# Patient Record
Sex: Female | Born: 1940
Health system: Southern US, Community
[De-identification: ages and names within clinical notes are randomized; demographics above are authoritative.]

## PROBLEM LIST (undated history)

## (undated) DIAGNOSIS — E119 Type 2 diabetes mellitus without complications: Secondary | ICD-10-CM

## (undated) DIAGNOSIS — K219 Gastro-esophageal reflux disease without esophagitis: Secondary | ICD-10-CM

## (undated) DIAGNOSIS — E785 Hyperlipidemia, unspecified: Secondary | ICD-10-CM

## (undated) DIAGNOSIS — F319 Bipolar disorder, unspecified: Secondary | ICD-10-CM

## (undated) DIAGNOSIS — I499 Cardiac arrhythmia, unspecified: Secondary | ICD-10-CM

## (undated) DIAGNOSIS — F209 Schizophrenia, unspecified: Secondary | ICD-10-CM

## (undated) DIAGNOSIS — I10 Essential (primary) hypertension: Secondary | ICD-10-CM

## (undated) HISTORY — DX: Hyperlipidemia, unspecified: E78.5

## (undated) HISTORY — DX: Type 2 diabetes mellitus without complications: E11.9

## (undated) HISTORY — DX: Essential (primary) hypertension: I10

---

## 2006-10-10 ENCOUNTER — Ambulatory Visit: Payer: Self-pay | Admitting: Ophthalmology

## 2006-10-18 ENCOUNTER — Ambulatory Visit: Payer: Self-pay | Admitting: Ophthalmology

## 2011-04-11 DIAGNOSIS — H4010X0 Unspecified open-angle glaucoma, stage unspecified: Secondary | ICD-10-CM | POA: Diagnosis not present

## 2011-04-15 DIAGNOSIS — F259 Schizoaffective disorder, unspecified: Secondary | ICD-10-CM | POA: Diagnosis not present

## 2011-05-02 DIAGNOSIS — F259 Schizoaffective disorder, unspecified: Secondary | ICD-10-CM | POA: Diagnosis not present

## 2011-05-03 DIAGNOSIS — Z130 Encounter for screening for diseases of the blood and blood-forming organs and certain disorders involving the immune mechanism: Secondary | ICD-10-CM | POA: Diagnosis not present

## 2011-05-03 DIAGNOSIS — I10 Essential (primary) hypertension: Secondary | ICD-10-CM | POA: Diagnosis not present

## 2011-05-03 DIAGNOSIS — Z1329 Encounter for screening for other suspected endocrine disorder: Secondary | ICD-10-CM | POA: Diagnosis not present

## 2011-05-03 DIAGNOSIS — E119 Type 2 diabetes mellitus without complications: Secondary | ICD-10-CM | POA: Diagnosis not present

## 2011-05-16 DIAGNOSIS — F259 Schizoaffective disorder, unspecified: Secondary | ICD-10-CM | POA: Diagnosis not present

## 2011-05-30 DIAGNOSIS — F259 Schizoaffective disorder, unspecified: Secondary | ICD-10-CM | POA: Diagnosis not present

## 2011-06-06 DIAGNOSIS — E119 Type 2 diabetes mellitus without complications: Secondary | ICD-10-CM | POA: Diagnosis not present

## 2011-06-06 DIAGNOSIS — I10 Essential (primary) hypertension: Secondary | ICD-10-CM | POA: Diagnosis not present

## 2011-06-06 DIAGNOSIS — Z13228 Encounter for screening for other metabolic disorders: Secondary | ICD-10-CM | POA: Diagnosis not present

## 2011-06-06 DIAGNOSIS — Z130 Encounter for screening for diseases of the blood and blood-forming organs and certain disorders involving the immune mechanism: Secondary | ICD-10-CM | POA: Diagnosis not present

## 2011-06-06 DIAGNOSIS — F259 Schizoaffective disorder, unspecified: Secondary | ICD-10-CM | POA: Diagnosis not present

## 2011-06-13 DIAGNOSIS — F259 Schizoaffective disorder, unspecified: Secondary | ICD-10-CM | POA: Diagnosis not present

## 2011-07-01 DIAGNOSIS — F259 Schizoaffective disorder, unspecified: Secondary | ICD-10-CM | POA: Diagnosis not present

## 2011-07-13 DIAGNOSIS — F259 Schizoaffective disorder, unspecified: Secondary | ICD-10-CM | POA: Diagnosis not present

## 2011-07-13 DIAGNOSIS — Z130 Encounter for screening for diseases of the blood and blood-forming organs and certain disorders involving the immune mechanism: Secondary | ICD-10-CM | POA: Diagnosis not present

## 2011-07-13 DIAGNOSIS — I10 Essential (primary) hypertension: Secondary | ICD-10-CM | POA: Diagnosis not present

## 2011-07-13 DIAGNOSIS — E7800 Pure hypercholesterolemia, unspecified: Secondary | ICD-10-CM | POA: Diagnosis not present

## 2011-07-13 DIAGNOSIS — Z1329 Encounter for screening for other suspected endocrine disorder: Secondary | ICD-10-CM | POA: Diagnosis not present

## 2011-07-13 DIAGNOSIS — E871 Hypo-osmolality and hyponatremia: Secondary | ICD-10-CM | POA: Diagnosis not present

## 2011-07-13 DIAGNOSIS — E875 Hyperkalemia: Secondary | ICD-10-CM | POA: Diagnosis not present

## 2011-07-18 DIAGNOSIS — F259 Schizoaffective disorder, unspecified: Secondary | ICD-10-CM | POA: Diagnosis not present

## 2011-08-01 DIAGNOSIS — F259 Schizoaffective disorder, unspecified: Secondary | ICD-10-CM | POA: Diagnosis not present

## 2011-08-15 DIAGNOSIS — F259 Schizoaffective disorder, unspecified: Secondary | ICD-10-CM | POA: Diagnosis not present

## 2011-09-02 DIAGNOSIS — F259 Schizoaffective disorder, unspecified: Secondary | ICD-10-CM | POA: Diagnosis not present

## 2011-09-16 DIAGNOSIS — F259 Schizoaffective disorder, unspecified: Secondary | ICD-10-CM | POA: Diagnosis not present

## 2011-09-30 DIAGNOSIS — F259 Schizoaffective disorder, unspecified: Secondary | ICD-10-CM | POA: Diagnosis not present

## 2011-10-11 DIAGNOSIS — H4010X0 Unspecified open-angle glaucoma, stage unspecified: Secondary | ICD-10-CM | POA: Diagnosis not present

## 2011-10-14 DIAGNOSIS — F259 Schizoaffective disorder, unspecified: Secondary | ICD-10-CM | POA: Diagnosis not present

## 2011-10-28 DIAGNOSIS — F259 Schizoaffective disorder, unspecified: Secondary | ICD-10-CM | POA: Diagnosis not present

## 2011-11-01 DIAGNOSIS — Z0000 Encounter for general adult medical examination without abnormal findings: Secondary | ICD-10-CM | POA: Diagnosis not present

## 2011-11-01 DIAGNOSIS — E119 Type 2 diabetes mellitus without complications: Secondary | ICD-10-CM | POA: Diagnosis not present

## 2011-11-11 DIAGNOSIS — F259 Schizoaffective disorder, unspecified: Secondary | ICD-10-CM | POA: Diagnosis not present

## 2011-11-15 DIAGNOSIS — H4010X0 Unspecified open-angle glaucoma, stage unspecified: Secondary | ICD-10-CM | POA: Diagnosis not present

## 2011-11-21 DIAGNOSIS — F259 Schizoaffective disorder, unspecified: Secondary | ICD-10-CM | POA: Diagnosis not present

## 2011-12-09 DIAGNOSIS — F259 Schizoaffective disorder, unspecified: Secondary | ICD-10-CM | POA: Diagnosis not present

## 2011-12-13 DIAGNOSIS — Z23 Encounter for immunization: Secondary | ICD-10-CM | POA: Diagnosis not present

## 2011-12-13 DIAGNOSIS — Z1329 Encounter for screening for other suspected endocrine disorder: Secondary | ICD-10-CM | POA: Diagnosis not present

## 2011-12-13 DIAGNOSIS — Z130 Encounter for screening for diseases of the blood and blood-forming organs and certain disorders involving the immune mechanism: Secondary | ICD-10-CM | POA: Diagnosis not present

## 2011-12-13 DIAGNOSIS — E119 Type 2 diabetes mellitus without complications: Secondary | ICD-10-CM | POA: Diagnosis not present

## 2011-12-13 DIAGNOSIS — F259 Schizoaffective disorder, unspecified: Secondary | ICD-10-CM | POA: Diagnosis not present

## 2011-12-13 DIAGNOSIS — K219 Gastro-esophageal reflux disease without esophagitis: Secondary | ICD-10-CM | POA: Diagnosis not present

## 2011-12-23 DIAGNOSIS — F259 Schizoaffective disorder, unspecified: Secondary | ICD-10-CM | POA: Diagnosis not present

## 2011-12-28 DIAGNOSIS — F259 Schizoaffective disorder, unspecified: Secondary | ICD-10-CM | POA: Diagnosis not present

## 2012-01-20 DIAGNOSIS — F259 Schizoaffective disorder, unspecified: Secondary | ICD-10-CM | POA: Diagnosis not present

## 2012-02-03 DIAGNOSIS — F259 Schizoaffective disorder, unspecified: Secondary | ICD-10-CM | POA: Diagnosis not present

## 2012-02-17 DIAGNOSIS — F259 Schizoaffective disorder, unspecified: Secondary | ICD-10-CM | POA: Diagnosis not present

## 2012-03-02 DIAGNOSIS — F259 Schizoaffective disorder, unspecified: Secondary | ICD-10-CM | POA: Diagnosis not present

## 2012-03-16 DIAGNOSIS — F259 Schizoaffective disorder, unspecified: Secondary | ICD-10-CM | POA: Diagnosis not present

## 2012-03-30 DIAGNOSIS — F259 Schizoaffective disorder, unspecified: Secondary | ICD-10-CM | POA: Diagnosis not present

## 2012-04-13 DIAGNOSIS — F259 Schizoaffective disorder, unspecified: Secondary | ICD-10-CM | POA: Diagnosis not present

## 2012-04-23 DIAGNOSIS — F259 Schizoaffective disorder, unspecified: Secondary | ICD-10-CM | POA: Diagnosis not present

## 2012-05-07 DIAGNOSIS — F259 Schizoaffective disorder, unspecified: Secondary | ICD-10-CM | POA: Diagnosis not present

## 2012-05-14 DIAGNOSIS — E119 Type 2 diabetes mellitus without complications: Secondary | ICD-10-CM | POA: Diagnosis not present

## 2012-05-14 DIAGNOSIS — Z13228 Encounter for screening for other metabolic disorders: Secondary | ICD-10-CM | POA: Diagnosis not present

## 2012-05-14 DIAGNOSIS — I10 Essential (primary) hypertension: Secondary | ICD-10-CM | POA: Diagnosis not present

## 2012-05-14 DIAGNOSIS — Z1329 Encounter for screening for other suspected endocrine disorder: Secondary | ICD-10-CM | POA: Diagnosis not present

## 2012-05-21 DIAGNOSIS — F259 Schizoaffective disorder, unspecified: Secondary | ICD-10-CM | POA: Diagnosis not present

## 2012-05-23 DIAGNOSIS — F259 Schizoaffective disorder, unspecified: Secondary | ICD-10-CM | POA: Diagnosis not present

## 2012-05-31 DIAGNOSIS — H4010X0 Unspecified open-angle glaucoma, stage unspecified: Secondary | ICD-10-CM | POA: Diagnosis not present

## 2012-06-11 DIAGNOSIS — F259 Schizoaffective disorder, unspecified: Secondary | ICD-10-CM | POA: Diagnosis not present

## 2012-06-25 DIAGNOSIS — F259 Schizoaffective disorder, unspecified: Secondary | ICD-10-CM | POA: Diagnosis not present

## 2012-07-09 DIAGNOSIS — F259 Schizoaffective disorder, unspecified: Secondary | ICD-10-CM | POA: Diagnosis not present

## 2012-07-23 DIAGNOSIS — F259 Schizoaffective disorder, unspecified: Secondary | ICD-10-CM | POA: Diagnosis not present

## 2012-08-06 DIAGNOSIS — F259 Schizoaffective disorder, unspecified: Secondary | ICD-10-CM | POA: Diagnosis not present

## 2012-08-13 DIAGNOSIS — E119 Type 2 diabetes mellitus without complications: Secondary | ICD-10-CM | POA: Diagnosis not present

## 2012-08-13 DIAGNOSIS — K219 Gastro-esophageal reflux disease without esophagitis: Secondary | ICD-10-CM | POA: Diagnosis not present

## 2012-08-13 DIAGNOSIS — Z130 Encounter for screening for diseases of the blood and blood-forming organs and certain disorders involving the immune mechanism: Secondary | ICD-10-CM | POA: Diagnosis not present

## 2012-08-13 DIAGNOSIS — Z1329 Encounter for screening for other suspected endocrine disorder: Secondary | ICD-10-CM | POA: Diagnosis not present

## 2012-08-13 DIAGNOSIS — I10 Essential (primary) hypertension: Secondary | ICD-10-CM | POA: Diagnosis not present

## 2012-08-27 DIAGNOSIS — F259 Schizoaffective disorder, unspecified: Secondary | ICD-10-CM | POA: Diagnosis not present

## 2012-09-10 DIAGNOSIS — F259 Schizoaffective disorder, unspecified: Secondary | ICD-10-CM | POA: Diagnosis not present

## 2012-09-24 DIAGNOSIS — F259 Schizoaffective disorder, unspecified: Secondary | ICD-10-CM | POA: Diagnosis not present

## 2012-09-27 DIAGNOSIS — F259 Schizoaffective disorder, unspecified: Secondary | ICD-10-CM | POA: Diagnosis not present

## 2012-10-08 DIAGNOSIS — F259 Schizoaffective disorder, unspecified: Secondary | ICD-10-CM | POA: Diagnosis not present

## 2012-10-22 DIAGNOSIS — F259 Schizoaffective disorder, unspecified: Secondary | ICD-10-CM | POA: Diagnosis not present

## 2012-11-05 DIAGNOSIS — F259 Schizoaffective disorder, unspecified: Secondary | ICD-10-CM | POA: Diagnosis not present

## 2012-11-07 DIAGNOSIS — F259 Schizoaffective disorder, unspecified: Secondary | ICD-10-CM | POA: Diagnosis not present

## 2012-11-19 DIAGNOSIS — F259 Schizoaffective disorder, unspecified: Secondary | ICD-10-CM | POA: Diagnosis not present

## 2012-11-29 DIAGNOSIS — H4010X0 Unspecified open-angle glaucoma, stage unspecified: Secondary | ICD-10-CM | POA: Diagnosis not present

## 2012-12-10 DIAGNOSIS — F259 Schizoaffective disorder, unspecified: Secondary | ICD-10-CM | POA: Diagnosis not present

## 2012-12-24 DIAGNOSIS — F259 Schizoaffective disorder, unspecified: Secondary | ICD-10-CM | POA: Diagnosis not present

## 2012-12-28 DIAGNOSIS — Z23 Encounter for immunization: Secondary | ICD-10-CM | POA: Diagnosis not present

## 2013-01-07 DIAGNOSIS — F259 Schizoaffective disorder, unspecified: Secondary | ICD-10-CM | POA: Diagnosis not present

## 2013-01-21 DIAGNOSIS — F259 Schizoaffective disorder, unspecified: Secondary | ICD-10-CM | POA: Diagnosis not present

## 2013-02-04 DIAGNOSIS — F259 Schizoaffective disorder, unspecified: Secondary | ICD-10-CM | POA: Diagnosis not present

## 2013-03-01 DIAGNOSIS — F259 Schizoaffective disorder, unspecified: Secondary | ICD-10-CM | POA: Diagnosis not present

## 2013-03-15 DIAGNOSIS — F259 Schizoaffective disorder, unspecified: Secondary | ICD-10-CM | POA: Diagnosis not present

## 2013-03-29 DIAGNOSIS — F259 Schizoaffective disorder, unspecified: Secondary | ICD-10-CM | POA: Diagnosis not present

## 2013-04-19 DIAGNOSIS — F259 Schizoaffective disorder, unspecified: Secondary | ICD-10-CM | POA: Diagnosis not present

## 2013-04-23 DIAGNOSIS — F259 Schizoaffective disorder, unspecified: Secondary | ICD-10-CM | POA: Diagnosis not present

## 2013-05-10 DIAGNOSIS — F259 Schizoaffective disorder, unspecified: Secondary | ICD-10-CM | POA: Diagnosis not present

## 2013-06-07 DIAGNOSIS — F259 Schizoaffective disorder, unspecified: Secondary | ICD-10-CM | POA: Diagnosis not present

## 2013-06-25 DIAGNOSIS — F209 Schizophrenia, unspecified: Secondary | ICD-10-CM | POA: Diagnosis not present

## 2013-07-05 DIAGNOSIS — F259 Schizoaffective disorder, unspecified: Secondary | ICD-10-CM | POA: Diagnosis not present

## 2013-07-19 DIAGNOSIS — F259 Schizoaffective disorder, unspecified: Secondary | ICD-10-CM | POA: Diagnosis not present

## 2013-07-20 ENCOUNTER — Emergency Department: Payer: Self-pay | Admitting: Emergency Medicine

## 2013-07-20 DIAGNOSIS — R079 Chest pain, unspecified: Secondary | ICD-10-CM | POA: Diagnosis not present

## 2013-07-20 DIAGNOSIS — I10 Essential (primary) hypertension: Secondary | ICD-10-CM | POA: Diagnosis not present

## 2013-07-20 DIAGNOSIS — T07XXXA Unspecified multiple injuries, initial encounter: Secondary | ICD-10-CM | POA: Diagnosis not present

## 2013-07-20 DIAGNOSIS — R071 Chest pain on breathing: Secondary | ICD-10-CM | POA: Diagnosis not present

## 2013-07-20 LAB — COMPREHENSIVE METABOLIC PANEL
ALK PHOS: 121 U/L — AB
ANION GAP: 6 — AB (ref 7–16)
Albumin: 3.6 g/dL (ref 3.4–5.0)
BUN: 7 mg/dL (ref 7–18)
Bilirubin,Total: 0.3 mg/dL (ref 0.2–1.0)
CALCIUM: 10 mg/dL (ref 8.5–10.1)
CREATININE: 0.85 mg/dL (ref 0.60–1.30)
Chloride: 90 mmol/L — ABNORMAL LOW (ref 98–107)
Co2: 29 mmol/L (ref 21–32)
EGFR (African American): >60
EGFR (Non-African Amer.): >60
Glucose: 398 mg/dL — ABNORMAL HIGH (ref 65–99)
Osmolality: 266 (ref 275–301)
Potassium: 4.3 mmol/L (ref 3.5–5.1)
SGOT(AST): 19 U/L (ref 15–37)
SGPT (ALT): 37 U/L (ref 12–78)
Sodium: 125 mmol/L — ABNORMAL LOW (ref 136–145)
Total Protein: 8.2 g/dL (ref 6.4–8.2)

## 2013-07-20 LAB — URINALYSIS, COMPLETE
Bacteria: NONE SEEN
Bilirubin,UR: NEGATIVE
Blood: NEGATIVE
Glucose,UR: >=500 mg/dL (ref 0–75)
Ketone: NEGATIVE
LEUKOCYTE ESTERASE: NEGATIVE
Nitrite: NEGATIVE
PROTEIN: NEGATIVE
Ph: 7 (ref 4.5–8.0)
RBC,UR: <1 /HPF (ref 0–5)
Specific Gravity: 1 (ref 1.003–1.030)
WBC UR: 2 /HPF (ref 0–5)

## 2013-07-20 LAB — CBC WITH DIFFERENTIAL/PLATELET
BASOS PCT: 0.6 %
Basophil #: 0.1 10*3/uL (ref 0.0–0.1)
Eosinophil #: 0.1 10*3/uL (ref 0.0–0.7)
Eosinophil %: 1.4 %
HCT: 37.8 % (ref 35.0–47.0)
HGB: 12.3 g/dL (ref 12.0–16.0)
LYMPHS ABS: 2.5 10*3/uL (ref 1.0–3.6)
LYMPHS PCT: 25.6 %
MCH: 26.6 pg (ref 26.0–34.0)
MCHC: 32.5 g/dL (ref 32.0–36.0)
MCV: 82 fL (ref 80–100)
MONO ABS: 0.6 x10 3/mm (ref 0.2–0.9)
Monocyte %: 6.2 %
Neutrophil #: 6.6 10*3/uL — ABNORMAL HIGH (ref 1.4–6.5)
Neutrophil %: 66.2 %
Platelet: 303 10*3/uL (ref 150–440)
RBC: 4.62 10*6/uL (ref 3.80–5.20)
RDW: 15.9 % — ABNORMAL HIGH (ref 11.5–14.5)
WBC: 9.9 10*3/uL (ref 3.6–11.0)

## 2013-07-20 LAB — BASIC METABOLIC PANEL
Anion Gap: 8 (ref 7–16)
BUN: 5 mg/dL — ABNORMAL LOW (ref 7–18)
CALCIUM: 9 mg/dL (ref 8.5–10.1)
CO2: 28 mmol/L (ref 21–32)
CREATININE: 0.57 mg/dL — AB (ref 0.60–1.30)
Chloride: 97 mmol/L — ABNORMAL LOW (ref 98–107)
EGFR (Non-African Amer.): >60
Glucose: 303 mg/dL — ABNORMAL HIGH (ref 65–99)
OSMOLALITY: 275 (ref 275–301)
Potassium: 4 mmol/L (ref 3.5–5.1)
Sodium: 133 mmol/L — ABNORMAL LOW (ref 136–145)

## 2013-07-20 LAB — TROPONIN I: Troponin-I: <0.02 ng/mL

## 2013-07-20 LAB — TSH: Thyroid Stimulating Horm: 1.69 u[IU]/mL

## 2013-08-02 DIAGNOSIS — F259 Schizoaffective disorder, unspecified: Secondary | ICD-10-CM | POA: Diagnosis not present

## 2013-08-09 DIAGNOSIS — E119 Type 2 diabetes mellitus without complications: Secondary | ICD-10-CM | POA: Diagnosis not present

## 2013-08-09 DIAGNOSIS — F259 Schizoaffective disorder, unspecified: Secondary | ICD-10-CM | POA: Diagnosis not present

## 2013-08-09 DIAGNOSIS — F79 Unspecified intellectual disabilities: Secondary | ICD-10-CM | POA: Diagnosis not present

## 2013-08-09 DIAGNOSIS — Z79899 Other long term (current) drug therapy: Secondary | ICD-10-CM | POA: Diagnosis not present

## 2013-08-09 DIAGNOSIS — I10 Essential (primary) hypertension: Secondary | ICD-10-CM | POA: Diagnosis not present

## 2013-08-16 DIAGNOSIS — I10 Essential (primary) hypertension: Secondary | ICD-10-CM | POA: Diagnosis not present

## 2013-08-16 DIAGNOSIS — Z79899 Other long term (current) drug therapy: Secondary | ICD-10-CM | POA: Diagnosis not present

## 2013-08-16 DIAGNOSIS — F259 Schizoaffective disorder, unspecified: Secondary | ICD-10-CM | POA: Diagnosis not present

## 2013-08-16 DIAGNOSIS — E119 Type 2 diabetes mellitus without complications: Secondary | ICD-10-CM | POA: Diagnosis not present

## 2013-08-16 DIAGNOSIS — F79 Unspecified intellectual disabilities: Secondary | ICD-10-CM | POA: Diagnosis not present

## 2013-08-30 DIAGNOSIS — F259 Schizoaffective disorder, unspecified: Secondary | ICD-10-CM | POA: Diagnosis not present

## 2013-09-13 DIAGNOSIS — F259 Schizoaffective disorder, unspecified: Secondary | ICD-10-CM | POA: Diagnosis not present

## 2013-10-28 ENCOUNTER — Inpatient Hospital Stay: Payer: Self-pay | Admitting: Psychiatry

## 2013-10-28 DIAGNOSIS — Z9119 Patient's noncompliance with other medical treatment and regimen: Secondary | ICD-10-CM | POA: Diagnosis not present

## 2013-10-28 DIAGNOSIS — F29 Unspecified psychosis not due to a substance or known physiological condition: Secondary | ICD-10-CM | POA: Diagnosis not present

## 2013-10-28 DIAGNOSIS — R930 Abnormal findings on diagnostic imaging of skull and head, not elsewhere classified: Secondary | ICD-10-CM | POA: Diagnosis not present

## 2013-10-28 DIAGNOSIS — F2089 Other schizophrenia: Secondary | ICD-10-CM | POA: Diagnosis not present

## 2013-10-28 DIAGNOSIS — IMO0001 Reserved for inherently not codable concepts without codable children: Secondary | ICD-10-CM | POA: Diagnosis present

## 2013-10-28 DIAGNOSIS — G4700 Insomnia, unspecified: Secondary | ICD-10-CM | POA: Diagnosis present

## 2013-10-28 DIAGNOSIS — F209 Schizophrenia, unspecified: Secondary | ICD-10-CM | POA: Diagnosis not present

## 2013-10-28 DIAGNOSIS — E876 Hypokalemia: Secondary | ICD-10-CM | POA: Diagnosis not present

## 2013-10-28 DIAGNOSIS — F919 Conduct disorder, unspecified: Secondary | ICD-10-CM | POA: Diagnosis not present

## 2013-10-28 DIAGNOSIS — Z91199 Patient's noncompliance with other medical treatment and regimen due to unspecified reason: Secondary | ICD-10-CM | POA: Diagnosis not present

## 2013-10-28 DIAGNOSIS — J984 Other disorders of lung: Secondary | ICD-10-CM | POA: Diagnosis not present

## 2013-10-28 DIAGNOSIS — I10 Essential (primary) hypertension: Secondary | ICD-10-CM | POA: Diagnosis not present

## 2013-10-28 DIAGNOSIS — N390 Urinary tract infection, site not specified: Secondary | ICD-10-CM | POA: Diagnosis not present

## 2013-10-28 DIAGNOSIS — R4182 Altered mental status, unspecified: Secondary | ICD-10-CM | POA: Diagnosis not present

## 2013-10-28 DIAGNOSIS — F21 Schizotypal disorder: Secondary | ICD-10-CM | POA: Diagnosis not present

## 2013-10-28 DIAGNOSIS — E871 Hypo-osmolality and hyponatremia: Secondary | ICD-10-CM | POA: Diagnosis not present

## 2013-10-28 DIAGNOSIS — F259 Schizoaffective disorder, unspecified: Secondary | ICD-10-CM | POA: Diagnosis not present

## 2013-10-28 LAB — DRUG SCREEN, URINE

## 2013-10-28 LAB — CBC
HCT: 36.9 % (ref 35.0–47.0)
HGB: 11.9 g/dL — AB (ref 12.0–16.0)
MCH: 26.3 pg (ref 26.0–34.0)
MCHC: 32.1 g/dL (ref 32.0–36.0)
MCV: 82 fL (ref 80–100)
PLATELETS: 274 10*3/uL (ref 150–440)
RBC: 4.51 10*6/uL (ref 3.80–5.20)
RDW: 16 % — ABNORMAL HIGH (ref 11.5–14.5)
WBC: 14.5 10*3/uL — ABNORMAL HIGH (ref 3.6–11.0)

## 2013-10-28 LAB — CK TOTAL AND CKMB (NOT AT ARMC)
CK, Total: 231 U/L — ABNORMAL HIGH
CK-MB: 1.7 ng/mL (ref 0.5–3.6)

## 2013-10-28 LAB — COMPREHENSIVE METABOLIC PANEL
ALBUMIN: 3.4 g/dL (ref 3.4–5.0)
ALK PHOS: 125 U/L — AB
ALT: 29 U/L
ANION GAP: 12 (ref 7–16)
BUN: 9 mg/dL (ref 7–18)
Bilirubin,Total: 0.6 mg/dL (ref 0.2–1.0)
Calcium, Total: 8.7 mg/dL (ref 8.5–10.1)
Chloride: 94 mmol/L — ABNORMAL LOW (ref 98–107)
Co2: 24 mmol/L (ref 21–32)
Creatinine: 1.04 mg/dL (ref 0.60–1.30)
EGFR (Non-African Amer.): 54 — ABNORMAL LOW
Glucose: 384 mg/dL — ABNORMAL HIGH (ref 65–99)
Osmolality: 275 (ref 275–301)
POTASSIUM: 3.4 mmol/L — AB (ref 3.5–5.1)
SGOT(AST): 23 U/L (ref 15–37)
Sodium: 130 mmol/L — ABNORMAL LOW (ref 136–145)
TOTAL PROTEIN: 7.9 g/dL (ref 6.4–8.2)

## 2013-10-28 LAB — URINALYSIS, COMPLETE
BACTERIA: NONE SEEN
Bilirubin,UR: NEGATIVE
NITRITE: NEGATIVE
Ph: 7 (ref 4.5–8.0)
Protein: 25
SPECIFIC GRAVITY: 1.005 (ref 1.003–1.030)
Squamous Epithelial: <1
WBC UR: 21 /HPF (ref 0–5)

## 2013-10-28 LAB — HEMOGLOBIN A1C: HEMOGLOBIN A1C: 11 % — AB (ref 4.2–6.3)

## 2013-10-28 LAB — ETHANOL
Ethanol %: <0.003 % (ref 0.000–0.080)
Ethanol: <3 mg/dL

## 2013-10-28 LAB — TROPONIN I

## 2013-10-29 LAB — URINE CULTURE

## 2013-10-30 LAB — BASIC METABOLIC PANEL
ANION GAP: 10 (ref 7–16)
BUN: 15 mg/dL (ref 7–18)
CALCIUM: 9.2 mg/dL (ref 8.5–10.1)
CHLORIDE: 97 mmol/L — AB (ref 98–107)
CREATININE: 1.32 mg/dL — AB (ref 0.60–1.30)
Co2: 24 mmol/L (ref 21–32)
EGFR (African American): 47 — ABNORMAL LOW
EGFR (Non-African Amer.): 41 — ABNORMAL LOW
GLUCOSE: 211 mg/dL — AB (ref 65–99)
Osmolality: 270 (ref 275–301)
Potassium: 4 mmol/L (ref 3.5–5.1)
Sodium: 131 mmol/L — ABNORMAL LOW (ref 136–145)

## 2013-10-30 LAB — LIPID PANEL
CHOLESTEROL: 204 mg/dL — AB (ref 0–200)
HDL: 38 mg/dL — AB (ref 40–60)
Ldl Cholesterol, Calc: 118 mg/dL — ABNORMAL HIGH (ref 0–100)
TRIGLYCERIDES: 240 mg/dL — AB (ref 0–200)
VLDL CHOLESTEROL, CALC: 48 mg/dL — AB (ref 5–40)

## 2013-11-01 LAB — BASIC METABOLIC PANEL
ANION GAP: 9 (ref 7–16)
BUN: 15 mg/dL (ref 7–18)
CALCIUM: 9.5 mg/dL (ref 8.5–10.1)
Chloride: 100 mmol/L (ref 98–107)
Co2: 25 mmol/L (ref 21–32)
Creatinine: 1.02 mg/dL (ref 0.60–1.30)
EGFR (Non-African Amer.): 56 — ABNORMAL LOW
GLUCOSE: 212 mg/dL — AB (ref 65–99)
Osmolality: 275 (ref 275–301)
POTASSIUM: 4.3 mmol/L (ref 3.5–5.1)
SODIUM: 134 mmol/L — AB (ref 136–145)

## 2013-11-01 LAB — VALPROIC ACID LEVEL: VALPROIC ACID: 81 ug/mL

## 2013-11-21 DIAGNOSIS — E669 Obesity, unspecified: Secondary | ICD-10-CM | POA: Diagnosis not present

## 2013-11-21 DIAGNOSIS — IMO0001 Reserved for inherently not codable concepts without codable children: Secondary | ICD-10-CM | POA: Diagnosis not present

## 2013-11-21 DIAGNOSIS — I10 Essential (primary) hypertension: Secondary | ICD-10-CM | POA: Diagnosis not present

## 2013-11-21 DIAGNOSIS — I2510 Atherosclerotic heart disease of native coronary artery without angina pectoris: Secondary | ICD-10-CM | POA: Diagnosis not present

## 2013-11-26 ENCOUNTER — Other Ambulatory Visit (HOSPITAL_COMMUNITY): Payer: Self-pay | Admitting: Internal Medicine

## 2013-11-26 DIAGNOSIS — Z139 Encounter for screening, unspecified: Secondary | ICD-10-CM

## 2013-11-29 ENCOUNTER — Other Ambulatory Visit (HOSPITAL_COMMUNITY): Payer: Self-pay | Admitting: Internal Medicine

## 2013-11-29 ENCOUNTER — Ambulatory Visit (HOSPITAL_COMMUNITY)
Admission: RE | Admit: 2013-11-29 | Discharge: 2013-11-29 | Disposition: A | Discharge status: Home / Self Care | Payer: Medicare Other | Source: Ambulatory Visit | Attending: Internal Medicine | Admitting: Internal Medicine

## 2013-11-29 DIAGNOSIS — Z1231 Encounter for screening mammogram for malignant neoplasm of breast: Secondary | ICD-10-CM | POA: Diagnosis not present

## 2013-11-29 DIAGNOSIS — R920 Mammographic microcalcification found on diagnostic imaging of breast: Secondary | ICD-10-CM | POA: Insufficient documentation

## 2013-11-29 DIAGNOSIS — Z139 Encounter for screening, unspecified: Secondary | ICD-10-CM

## 2013-12-04 ENCOUNTER — Telehealth: Payer: Self-pay

## 2013-12-04 DIAGNOSIS — E119 Type 2 diabetes mellitus without complications: Secondary | ICD-10-CM | POA: Diagnosis not present

## 2013-12-04 DIAGNOSIS — I10 Essential (primary) hypertension: Secondary | ICD-10-CM | POA: Diagnosis not present

## 2013-12-04 DIAGNOSIS — Z23 Encounter for immunization: Secondary | ICD-10-CM | POA: Diagnosis not present

## 2013-12-04 NOTE — Telephone Encounter (Signed)
PT's care giver called to schedule pt's colonoscopy. OV with Gerrit Halls, NP on 01/06/2014 at 11:00 AM due to her med list.

## 2013-12-06 ENCOUNTER — Other Ambulatory Visit: Payer: Self-pay | Admitting: Internal Medicine

## 2013-12-06 DIAGNOSIS — R928 Other abnormal and inconclusive findings on diagnostic imaging of breast: Secondary | ICD-10-CM

## 2013-12-24 ENCOUNTER — Encounter (HOSPITAL_COMMUNITY): Payer: Medicare Other

## 2013-12-30 DIAGNOSIS — E109 Type 1 diabetes mellitus without complications: Secondary | ICD-10-CM | POA: Diagnosis not present

## 2013-12-30 DIAGNOSIS — B353 Tinea pedis: Secondary | ICD-10-CM | POA: Diagnosis not present

## 2013-12-30 DIAGNOSIS — L609 Nail disorder, unspecified: Secondary | ICD-10-CM | POA: Diagnosis not present

## 2013-12-30 DIAGNOSIS — B351 Tinea unguium: Secondary | ICD-10-CM | POA: Diagnosis not present

## 2013-12-30 DIAGNOSIS — R600 Localized edema: Secondary | ICD-10-CM | POA: Diagnosis not present

## 2014-01-06 ENCOUNTER — Ambulatory Visit: Payer: Medicare Other | Admitting: Gastroenterology

## 2014-01-06 DIAGNOSIS — A184 Tuberculosis of skin and subcutaneous tissue: Secondary | ICD-10-CM | POA: Diagnosis not present

## 2014-01-09 DIAGNOSIS — F332 Major depressive disorder, recurrent severe without psychotic features: Secondary | ICD-10-CM | POA: Diagnosis not present

## 2014-02-04 DIAGNOSIS — A184 Tuberculosis of skin and subcutaneous tissue: Secondary | ICD-10-CM | POA: Diagnosis not present

## 2014-03-06 DIAGNOSIS — E1165 Type 2 diabetes mellitus with hyperglycemia: Secondary | ICD-10-CM | POA: Diagnosis not present

## 2014-03-06 DIAGNOSIS — E669 Obesity, unspecified: Secondary | ICD-10-CM | POA: Diagnosis not present

## 2014-03-06 DIAGNOSIS — I10 Essential (primary) hypertension: Secondary | ICD-10-CM | POA: Diagnosis not present

## 2014-03-10 DIAGNOSIS — L609 Nail disorder, unspecified: Secondary | ICD-10-CM | POA: Diagnosis not present

## 2014-03-10 DIAGNOSIS — E109 Type 1 diabetes mellitus without complications: Secondary | ICD-10-CM | POA: Diagnosis not present

## 2014-03-10 DIAGNOSIS — B351 Tinea unguium: Secondary | ICD-10-CM | POA: Diagnosis not present

## 2014-03-10 DIAGNOSIS — B353 Tinea pedis: Secondary | ICD-10-CM | POA: Diagnosis not present

## 2014-04-02 DIAGNOSIS — F332 Major depressive disorder, recurrent severe without psychotic features: Secondary | ICD-10-CM | POA: Diagnosis not present

## 2014-04-03 DIAGNOSIS — F201 Disorganized schizophrenia: Secondary | ICD-10-CM | POA: Diagnosis not present

## 2014-04-03 DIAGNOSIS — F332 Major depressive disorder, recurrent severe without psychotic features: Secondary | ICD-10-CM | POA: Diagnosis not present

## 2014-04-23 DIAGNOSIS — Z0001 Encounter for general adult medical examination with abnormal findings: Secondary | ICD-10-CM | POA: Diagnosis not present

## 2014-04-23 DIAGNOSIS — E669 Obesity, unspecified: Secondary | ICD-10-CM | POA: Diagnosis not present

## 2014-04-23 DIAGNOSIS — E1165 Type 2 diabetes mellitus with hyperglycemia: Secondary | ICD-10-CM | POA: Diagnosis not present

## 2014-04-23 DIAGNOSIS — I10 Essential (primary) hypertension: Secondary | ICD-10-CM | POA: Diagnosis not present

## 2014-05-19 DIAGNOSIS — E109 Type 1 diabetes mellitus without complications: Secondary | ICD-10-CM | POA: Diagnosis not present

## 2014-05-19 DIAGNOSIS — B353 Tinea pedis: Secondary | ICD-10-CM | POA: Diagnosis not present

## 2014-05-19 DIAGNOSIS — B351 Tinea unguium: Secondary | ICD-10-CM | POA: Diagnosis not present

## 2014-05-19 DIAGNOSIS — L609 Nail disorder, unspecified: Secondary | ICD-10-CM | POA: Diagnosis not present

## 2014-05-21 DIAGNOSIS — E1165 Type 2 diabetes mellitus with hyperglycemia: Secondary | ICD-10-CM | POA: Diagnosis not present

## 2014-05-21 DIAGNOSIS — I10 Essential (primary) hypertension: Secondary | ICD-10-CM | POA: Diagnosis not present

## 2014-06-17 DIAGNOSIS — I10 Essential (primary) hypertension: Secondary | ICD-10-CM | POA: Diagnosis not present

## 2014-06-17 DIAGNOSIS — E1165 Type 2 diabetes mellitus with hyperglycemia: Secondary | ICD-10-CM | POA: Diagnosis not present

## 2014-07-18 DIAGNOSIS — I10 Essential (primary) hypertension: Secondary | ICD-10-CM | POA: Diagnosis not present

## 2014-07-18 DIAGNOSIS — E1165 Type 2 diabetes mellitus with hyperglycemia: Secondary | ICD-10-CM | POA: Diagnosis not present

## 2014-07-19 NOTE — Consult Note (Signed)
Brief Consult Note: Diagnosis: 1. Uncontrolled DM 2. Schizophrenia.  3. UTI 4. Hyponatremia/HYpokalemia.   Patient was seen by consultant.   Consult note dictated.   Orders entered.   Discussed with Attending MD.   Comments: 74 yo female w/ hx of DM, Bipolar Disorder who was brought into the hospital by police as she was sitting outside her house talking to herself.  Pt. is admitted to behavior medicine for schizophrenia but was noted to have elevated BS.   1. Uncontrolled DM - likely related to medical non-compliance due to mental illness.  - will check HemoglobinA1c.  - SSI, Low dose Metformin and follow BS.  Carb control diet.   2. Hyponatremia - likely pseudohyponatremia due to hyperglycemic.    3. Hypokalemia - will replace and repeat in a.m.   4. UTI - cont. Bactrim  5. Schizophrenia - cont. care as per Psych.   Full Code Thanks for the consult and will follow with you.  Job # [id number].  Electronic Signatures: [place] SirenSainani, Sotero Brinkmeyer J (MD)  (Signed 03-Aug-15 14:26)  Authored: Brief Consult Note   Last Updated: 03-Aug-15 14:26 by [place] SirenSainani, Elaria Osias J (MD)

## 2014-07-19 NOTE — H&P (Signed)
PATIENT NAME:  Maria Pace, [name] MR#:  [id number] DATE OF BIRTH:  January 26, 1941  DATE OF ADMISSION:  10/28/2013  REFERRING PHYSICIAN: Emergency Room M.D.   ATTENDING PHYSICIAN: Danyela Posas B. Jennet MaduroPucilowska, M.D.   IDENTIFYING DATA: [name] is a 74 year old female with history of reported schizophrenia.   CHIEF COMPLAINT: The patient is unable to state.  HISTORY OF PRESENT ILLNESS: According to the patient that cannot be completely trusted and the chart. The patient has a history of schizophrenia. Many years ago, she was [facility]. The patient admits to prior problems, but reports that throughout her life, she never needed any medications and has not seen a psychiatrist ever since. She was petitioned by her family, her 72 year old aunt with whom she lives, for behaving strangely, talking to herself, throwing her medicines out, putting a spell on Dr. Elesa MassedWard, who is a psychiatrist. In the Emergency Room, the patient has been talking constantly, apparently delusional, paranoid and grandiose, telling me [facility]. The patient denies any symptoms of depression, psychosis, anxiety, but clearly is disorganized, psychotic him and hallucinating. She denies any problems with alcohol or substances.   PAST PSYCHIATRIC HISTORY: Prior hospitalization at [facility]. Apparently she is familiar with the name of Dr. Elesa MassedWard. It is not impossible that she has been a patient of his, we will investigate. There were no suicide attempts.   FAMILY PSYCHIATRIC HISTORY: She has a family member, an aunt, who is dead now, by the name of Pearl, who had mental illness.   PAST MEDICAL HISTORY: Diabetes. Hyponatremia, hypokalemia, urinary tract infection.   ALLERGIES: No known drug allergies.   MEDICATIONS ON ADMISSION: None. The patient was supposed to be taking a sugar pill, which she refused to take. She reportedly has not been compliant with any medications lately.   SOCIAL HISTORY: She tells me  that she owns a house and there by herself. Sometimes she tells me that she lives with her daughter. According to the chart, she lives with her aunt, who is 422 years old, but has been taking care of the patient. She is retired and has Medicaid.   REVIEW OF SYSTEMS: Difficult to obtain.  CONSTITUTIONAL: No fevers or chills. No weight changes.  EYES: No double or blurred vision.  ENT: No hearing loss.  RESPIRATORY: No shortness of breath or cough.  CARDIOVASCULAR: No chest pain or orthopnea.  GASTROINTESTINAL: No abdominal pain, nausea, vomiting, or diarrhea.  GENITOURINARY: No incontinence or frequency.  ENDOCRINE: No heat or cold intolerance.  LYMPHATIC: No anemia or easy bruising.  INTEGUMENTARY: No acne or rash.  MUSCULOSKELETAL: No muscle or joint pain.  NEUROLOGIC: No tingling or weakness.  PSYCHIATRIC: See history of present illness for details.   PHYSICAL EXAMINATION:  VITAL SIGNS: Blood pressure 145/95, pulse 120, respirations 20, temperature 98.8.  GENERAL: This is a slightly obese female, looking younger than stated age, in no acute distress.  HEENT: The pupils are equal, round, and reactive to light. Sclerae anicteric.  NECK: Supple. No thyromegaly.  LUNGS: Clear to auscultation. No dullness to percussion.  HEART: Regular rhythm and rate. No murmurs, rubs, or gallops.  ABDOMEN: Soft, nontender, nondistended. Positive bowel sounds.  MUSCULOSKELETAL: Normal muscle strength in all extremities.  SKIN: No rashes or bruises.  LYMPHATIC: No cervical adenopathy.  NEUROLOGIC: Cranial nerves II through XII are intact.   LABORATORY DATA: Chemistries are within normal limits except for blood glucose of 384, sodium 130, potassium 3.4. Blood alcohol level is  0. Hemoglobin A1c 11. LFTs within normal limits except for alkaline phosphatase of 125. Cardiac enzymes negative except for CK of 231. Urine toxicology screen negative for substances. CBC within normal limits except for white blood  count of 14.5. Urinalysis with leukocyte esterase 3+ and 21 red cells per field suggestive of urinary tract infection. Urine culture, however, shows mixed bacterial flora.   EKG: Sinus tachycardia, anterolateral infarct of undetermined age, abnormal EKG.   MENTAL STATUS EXAMINATION ON ADMISSION: The patient is evaluated on the behavioral medicine unit. This morning she was extremely agitated, fighting with security guards, trying to open the door, spitting on people, trying to induce vomiting in the dining area with all her peers present. She was given a Haldol injection and now is slightly sedated, very pleasant, polite and cooperative. She is oriented to person and place only. She is completely unaware of events that led to her admission to the hospital. She maintains good eye contact. She is well groomed, wearing a colorful nice dress and matching sneaker. She is cool and collected. No behavioral problems, polite and interactive during the interview. Her speech is of normal rhythm, rate and volume. Her mood is fine with full affect. Her speech is slurred and difficult to understand as the patient is edentulous. Thought process is logical with its own logic. She denies thoughts of hurting herself or others. She was paranoid, grandiose, delusional on admission. Reportedly she was talking to herself as if hallucinating in the Emergency Room yesterday. Her cognition is grossly intact, but impossible to assess today. She seems of normal intelligence and fund of knowledge. Her insight and judgment are very limited.   SUICIDE RISK ASSESSMENT ON ADMISSION: This is a patient with a history of reported schizophrenia, who was admitted in a psychotic episode in the context of treatment noncompliance.   INITIAL DIAGNOSES:  AXIS I: Schizophrenia, rule out delirium secondary to urinary tract infection. Rule out bipolar manic episode.  AXIS II: Deferred.  AXIS III: Hypertension, hyponatremia, urinary tract infection,  hypokalemia.  AXIS IV: Mental illness, treatment compliance, primary support, poor insight into her problems.  AXIS V: Global assessment of functioning, 35.   PLAN: The patient was admitted to [facility] Medicine Unit for safety, stabilization and medication management. She was initially placed on suicide precautions and was closely monitored for any unsafe behaviors. She underwent full psychiatric and risk assessment. She received pharmacotherapy, individual and group psychotherapy, substance abuse counseling, and support from therapeutic milieu.   1. Psychosis. She was started on Haldol in the Emergency Room. We will try to obtain any collateral information from Dr. Jolyn Nap office.  2. Agitation. She has Zyprexa Zydis as well as Haldol injections for agitation.  3. Insomnia. We will start temazepam for sleep.  4. Medical: She has not been compliant with medications at home, including "sugar pills". Sje was consulted by Medicine in the ER and treatment for diabetes and UTI was initiated.  5.   Social. It is unclear whether the patient stays by his herself or has any caretakers. We will obtain collateral information.  6. Disposition. Most likely with her family.    ____________________________ Ellin Goodie Jennet Maduro, MD jbp:jr D: 10/29/2013 13:53:13 ET T: 10/29/2013 14:30:51 ET JOB#: [id number]  cc: Gitel Beste B. Jennet Maduro, MD, <Dictator> Shari Prows MD ELECTRONICALLY SIGNED 10/29/2013 17:19

## 2014-07-19 NOTE — Consult Note (Signed)
PATIENT NAME:  Maria Maria Pace, Maria Maria Pace MR#:  [id number] DATE OF BIRTH:  1941/01/11  DATE OF CONSULTATION:  10/28/2013  REFERRING PHYSICIAN:   CONSULTING PHYSICIAN:  Audery AmelJohn T. Tahlor Berenguer, MD  IDENTIFYING INFORMATION AND REASON FOR CONSULT: Maria Pace 74 year old woman with Maria Pace reported past history of schizophrenia, who was petitioned by Designer, television/film setlaw enforcement officers. Consultation for disposition of IVC.   HISTORY OF PRESENT ILLNESS: Information obtained from the patient and the chart, but there is very limited information. Commitment paperwork by the sheriff's department states that the patient has been delusional, believing that her aunt was dead, and that they believe that the patient could be Maria Pace danger to her aunt. On interview today, the patient tells me that some things that look like men came to her house and brought her in. She cannot give me anymore clearer description than that. She does say that she knows that her family wants her to take her sugar pill and she has not been doing it. Does not have any particular reason for it. The patient is not Maria Pace very good historian, rambling, changes the topic constantly. Not currently reporting any suicidal or homicidal ideation. Does state that she is not taking any prescription medication at home. Does not report any thoughts of wanting to harm anyone, although on multiple occasions, she says that various people in her family have already died and have gone to hell.   PAST PSYCHIATRIC HISTORY: Unknown at this point. Evidently, Maria Pace diagnosis of schizophrenia. The patient can tell me that she has [facility] in the past. She is not able to tell me the names of any medications or when she was last taking any. Also says that she is never tried to harm herself or anyone else.   SUBSTANCE ABUSE HISTORY: No evidence of current substance abuse, no known past history.   SOCIAL HISTORY: Evidently she lives with her aunt who presumably is even older than she is. There seems to also be Maria Pace sister  listed as being Maria Pace family member. I tried to call the phone numbers in the chart and no one answered. The patient tells me that her sister takes her to the grocery store once Maria Pace month and helps her to do her errands. Says that she is able to take care of herself at home.   PAST MEDICAL HISTORY: The patient evidently has diabetes. Her blood sugars are quite high. Unknown what medicines she is supposed to be taking. She currently appears to have Maria Pace urinary tract infection. Other medical history not known.   REVIEW OF SYSTEMS: Poor historian does not have any specific complaints. Denies suicidal or homicidal ideation. Denies hallucinations, but at the same time, tells me that God talks to her. Does not have any specific physical complaints.   MENTAL STATUS EXAMINATION: Disheveled woman, looks her stated age or older, passively cooperative with the interview. Intermittent eye contact. Psychomotor activity fairly normal given her size. Speech was telegraphic, rambling. Affect labile, frequent episodes of giggling and laughing. No tearfulness. She raises her voice on Maria Pace few occasions, but does not appear to actually be hostile. Denies suicidal or homicidal ideation. Denies hallucinations and makes comments to suggest that she does hallucinate. She is actually alert and oriented completely, and is able to remember 3 out of 3 objects at 2 minutes and appears to probably be of average intelligence at baseline.   LABORATORY RESULTS: Head CT was actually altogether quite normal, not even particularly atrophied.   Urinalysis, 3+ leukocyte esterase, 21 white  cells with only 1 red cell.   Drug screen is all negative.   Blood sugars are running 300 to 400 here in the Emergency Room.   Chest x-ray, completely normal.   Troponins were all normal. Alcohol level normal. Chemistry showed Maria Pace low sodium at 130, low potassium 3.4, low chloride 94, probably all related to her elevated blood sugar, which was at the same time  384. Alkaline phosphatase slightly elevated at 125. CBC moderately elevated white count of 14.5, only mildly anemic.   ASSESSMENT: This is Maria Pace 74 year old woman with multiple medical problems, evidently not taking care of her diabetes, likely has Maria Pace urinary tract infection, petitioned by Patent examiner. They evidently felt concerned that her mental condition was incongruent with her independent living. The patient is not able to give me much in the way of history, and I have not been able to reach the family. The safest thing, at this time, appears to be to admit her to the hospital.   TREATMENT PLAN: Admit to psychiatry. Start her on empiric Haldol. Started her on empiric medicines for diabetes, but internal medicine will be seeing her to assist with further medical treatment. I put her on empiric Septra for the urinary tract infection. We will try and get collateral history and work with her family to see if we can find out what her baseline is and get her stabilized.   DIAGNOSIS, PRINCIPAL AND PRIMARY:  AXIS I: Schizophrenia, undifferentiated.   SECONDARY DIAGNOSES:  AXIS I: No further.  AXIS II: No diagnosis.  AXIS III: Diabetes, urinary tract infection.  AXIS IV: Severe from illness.  AXIS V: Functioning at the time of admission, 35.   ____________________________ Audery Amel, MD jtc:jr D: 10/28/2013 13:57:47 ET T: 10/28/2013 14:18:10 ET JOB#: [id number]  cc: Audery Amel, MD, <Dictator> Audery Amel MD ELECTRONICALLY SIGNED 12/06/2013 16:55

## 2014-07-19 NOTE — Consult Note (Signed)
PATIENT NAME:  Maria Pace, KAYES MR#:  [id number] DATE OF BIRTH:  1940-06-05  DATE OF CONSULTATION:  10/28/2013  CONSULTING PHYSICIAN:  Virgilia Quigg J. Cherlynn Kaiser, MD  CONSULTING PHYSICIAN: Dr. Toni Amend.  PRIMARY CARE PHYSICIAN: The patient does not have one.  CHIEF COMPLAINT: Uncontrolled diabetes.   HISTORY OF PRESENT ILLNESS: This is a 74 year old female who was admitted to the Behavioral Medicine due to schizophrenia. The patient apparently was found by police as she was talking to herself outside her house. The patient is a very poor historian. She does not appear to be in any distress except she keeps mumbling and talking to herself. Hospitalist services were contacted as her blood sugars have been uncontrolled.   REVIEW OF SYSTEMS: Unobtainable given the patient's mental status.   PAST MEDICAL HISTORY: Seems to be consistent with a history of diabetes and bipolar disorder.   SOCIAL HISTORY: Also unobtainable.   FAMILY HISTORY: Unobtainable.   ALLERGIES: No known drug allergies.   CURRENT MEDICATIONS: The patient is currently not taking any medications.   PHYSICAL EXAMINATION: Presently is as follows:  VITALS SIGNS: Noted to be: Temperature 98, pulse 109, respirations 18, blood pressure 144/116, saturations 98% on room air.  GENERAL: She is a pleasant-appearing female who was talking to herself but in no apparent distress.  HEAD, EYES, EARS, NOSE, AND THROAT: She is atraumatic, normocephalic. Extraocular muscles are intact. Pupils are equal and reactive eye to light. Sclerae are anicteric. No conjunctival injection. No oropharyngeal erythema.  NECK: Supple. There is no jugular venous distention. No bruits. No lymphadenopathy or thyromegaly.  HEART: Regular rate and rhythm. No murmurs, no rubs, no clicks.  LUNGS: Clear to auscultation bilaterally. No rales or rhonchi. No wheezes.  ABDOMEN: Soft, flat, nontender, nondistended. Has good bowel sounds. No hepatosplenomegaly appreciated.   EXTREMITIES: No evidence of any cyanosis, clubbing, or peripheral edema. Has +2 pedal and radial pulses bilaterally.  NEUROLOGICAL: The patient is alert, oriented x 3 with no focal, motor, or sensory deficits appreciated bilaterally.  SKIN: Moist and warm with no rashes appreciated.  LYMPHATIC: There is no cervical lymphadenopathy.   LABORATORY DATA: Showed a serum glucose of 384, BUN 9, creatinine 1.04, sodium 130, potassium 3.4, chloride 94, bicarbonate 24. The patient's LFTs are within normal limits. CK is 231. Troponin less than 0.02. Urine toxicology is negative. White cell count 14.5, hemoglobin 11.9, hematocrit 36.9, platelet count 274,000. Urinalysis shows 3+ leukocyte esterase with 21 white cells.   ASSESSMENT AND PLAN: This is a 74 year old female with history of diabetes, bipolar disorder who was brought into the hospital by police as she was sitting outside her house talking to herself. The patient is being admitted to Behavioral Medicine for schizophrenia but was noted to have uncontrolled blood sugars.  1. Uncontrolled diabetes. This is likely related to medical noncompliance due to her mental illness. I will check a hemoglobin A1c, place her on sliding scale insulin now, start her on low-dose metformin. Follow blood sugars, keep her on a carb-controlled diet.  2. Hyponatremia. This is likely pseudohyponatremia due to uncontrolled diabetes and should improve as her sugars correct. 3. Hypokalemia. I will go ahead and replace her potassium and recheck it in the morning,  4. Urinary tract infection. Continue Bactrim. 5. Schizophrenia. Continue care as per psychiatry.   CODE STATUS: The patient is a full code.   Thank you so forth so much for the consultation. We will follow along with you.   TIME SPENT: 45 minutes.   ____________________________ Danella Deis  Jasper LoserJ. Ladiamond Gallina, MD vjs:lt D: 10/28/2013 14:26:24 ET T: 10/28/2013 17:37:42 ET JOB#: [id number]  cc: Rolly PancakeVivek J. Cherlynn KaiserSainani, MD,  <Dictator> Houston SirenVIVEK J Kanyon Seibold MD ELECTRONICALLY SIGNED 11/08/2013 14:32

## 2014-08-18 DIAGNOSIS — B351 Tinea unguium: Secondary | ICD-10-CM | POA: Diagnosis not present

## 2014-08-18 DIAGNOSIS — B353 Tinea pedis: Secondary | ICD-10-CM | POA: Diagnosis not present

## 2014-08-18 DIAGNOSIS — E109 Type 1 diabetes mellitus without complications: Secondary | ICD-10-CM | POA: Diagnosis not present

## 2014-08-18 DIAGNOSIS — L609 Nail disorder, unspecified: Secondary | ICD-10-CM | POA: Diagnosis not present

## 2014-09-17 DIAGNOSIS — E1165 Type 2 diabetes mellitus with hyperglycemia: Secondary | ICD-10-CM | POA: Diagnosis not present

## 2014-09-17 DIAGNOSIS — I10 Essential (primary) hypertension: Secondary | ICD-10-CM | POA: Diagnosis not present

## 2014-10-02 ENCOUNTER — Other Ambulatory Visit (HOSPITAL_COMMUNITY): Payer: Self-pay | Admitting: Internal Medicine

## 2014-10-02 ENCOUNTER — Ambulatory Visit (HOSPITAL_COMMUNITY)
Admission: RE | Admit: 2014-10-02 | Discharge: 2014-10-02 | Disposition: A | Discharge status: Home / Self Care | Payer: Medicare Other | Source: Ambulatory Visit | Attending: Internal Medicine | Admitting: Internal Medicine

## 2014-10-02 DIAGNOSIS — J40 Bronchitis, not specified as acute or chronic: Secondary | ICD-10-CM

## 2014-10-02 DIAGNOSIS — R05 Cough: Secondary | ICD-10-CM | POA: Insufficient documentation

## 2014-10-02 DIAGNOSIS — R0602 Shortness of breath: Secondary | ICD-10-CM | POA: Insufficient documentation

## 2014-10-02 DIAGNOSIS — I10 Essential (primary) hypertension: Secondary | ICD-10-CM | POA: Diagnosis not present

## 2014-10-02 DIAGNOSIS — E1165 Type 2 diabetes mellitus with hyperglycemia: Secondary | ICD-10-CM | POA: Diagnosis not present

## 2014-10-07 DIAGNOSIS — F332 Major depressive disorder, recurrent severe without psychotic features: Secondary | ICD-10-CM | POA: Diagnosis not present

## 2014-10-15 DIAGNOSIS — F332 Major depressive disorder, recurrent severe without psychotic features: Secondary | ICD-10-CM | POA: Diagnosis not present

## 2014-10-23 DIAGNOSIS — I10 Essential (primary) hypertension: Secondary | ICD-10-CM | POA: Diagnosis not present

## 2014-10-23 DIAGNOSIS — E669 Obesity, unspecified: Secondary | ICD-10-CM | POA: Diagnosis not present

## 2014-10-23 DIAGNOSIS — E1165 Type 2 diabetes mellitus with hyperglycemia: Secondary | ICD-10-CM | POA: Diagnosis not present

## 2014-10-23 DIAGNOSIS — F209 Schizophrenia, unspecified: Secondary | ICD-10-CM | POA: Diagnosis not present

## 2014-11-18 DIAGNOSIS — I739 Peripheral vascular disease, unspecified: Secondary | ICD-10-CM | POA: Diagnosis not present

## 2014-11-18 DIAGNOSIS — E1142 Type 2 diabetes mellitus with diabetic polyneuropathy: Secondary | ICD-10-CM | POA: Diagnosis not present

## 2014-11-18 DIAGNOSIS — B351 Tinea unguium: Secondary | ICD-10-CM | POA: Diagnosis not present

## 2014-11-23 DIAGNOSIS — E1165 Type 2 diabetes mellitus with hyperglycemia: Secondary | ICD-10-CM | POA: Diagnosis not present

## 2014-11-23 DIAGNOSIS — I10 Essential (primary) hypertension: Secondary | ICD-10-CM | POA: Diagnosis not present

## 2014-12-16 DIAGNOSIS — H26492 Other secondary cataract, left eye: Secondary | ICD-10-CM | POA: Diagnosis not present

## 2014-12-16 DIAGNOSIS — E119 Type 2 diabetes mellitus without complications: Secondary | ICD-10-CM | POA: Diagnosis not present

## 2014-12-16 DIAGNOSIS — Z961 Presence of intraocular lens: Secondary | ICD-10-CM | POA: Diagnosis not present

## 2014-12-24 DIAGNOSIS — I10 Essential (primary) hypertension: Secondary | ICD-10-CM | POA: Diagnosis not present

## 2014-12-24 DIAGNOSIS — E1165 Type 2 diabetes mellitus with hyperglycemia: Secondary | ICD-10-CM | POA: Diagnosis not present

## 2014-12-25 DIAGNOSIS — Z23 Encounter for immunization: Secondary | ICD-10-CM | POA: Diagnosis not present

## 2015-01-06 ENCOUNTER — Ambulatory Visit (HOSPITAL_COMMUNITY)
Admission: RE | Admit: 2015-01-06 | Discharge: 2015-01-06 | Disposition: A | Discharge status: Home / Self Care | Payer: Medicare Other | Source: Ambulatory Visit | Attending: Ophthalmology | Admitting: Ophthalmology

## 2015-01-06 ENCOUNTER — Encounter (HOSPITAL_COMMUNITY)
Admission: RE | Disposition: A | Discharge status: Home / Self Care | Payer: Self-pay | Source: Ambulatory Visit | Attending: Ophthalmology

## 2015-01-06 DIAGNOSIS — E119 Type 2 diabetes mellitus without complications: Secondary | ICD-10-CM | POA: Diagnosis not present

## 2015-01-06 DIAGNOSIS — H26492 Other secondary cataract, left eye: Secondary | ICD-10-CM | POA: Insufficient documentation

## 2015-01-06 DIAGNOSIS — Z79899 Other long term (current) drug therapy: Secondary | ICD-10-CM | POA: Diagnosis not present

## 2015-01-06 DIAGNOSIS — I10 Essential (primary) hypertension: Secondary | ICD-10-CM | POA: Diagnosis not present

## 2015-01-06 DIAGNOSIS — Z7984 Long term (current) use of oral hypoglycemic drugs: Secondary | ICD-10-CM | POA: Diagnosis not present

## 2015-01-06 HISTORY — PX: YAG LASER APPLICATION: SHX6189

## 2015-01-06 SURGERY — TREATMENT, USING YAG LASER
Anesthesia: LOCAL | Laterality: Left

## 2015-01-06 MED ORDER — TROPICAMIDE 1 % OP SOLN
1.0000 [drp] | OPHTHALMIC | Status: AC
  Administered 2015-01-06 (×3): 1 [drp] via OPHTHALMIC

## 2015-01-06 MED ORDER — CYCLOPENTOLATE-PHENYLEPHRINE OP SOLN OPTIME - NO CHARGE
OPHTHALMIC | Status: AC
  Filled 2015-01-06: qty 2

## 2015-01-06 NOTE — Discharge Instructions (Signed)
Maria Pace  01/06/2015     Instructions    Activity: No Restrictions.   Diet: Resume Diet you were on at home.   Pain Medication: Tylenol if Needed.   CONTACT YOUR DOCTOR IF YOU HAVE PAIN, REDNESS IN YOUR EYE, OR DECREASED VISION.   Follow-up:today with Jethro Bolus, MD.   Dr. Nile Riggs: [phone]  Dr. Lita Mains: [phone]  Dr. Alto Denver: [phone]   If you find that you cannot contact your physician, but feel that your signs and   Symptoms warrant a physician's attention, call the Emergency Room at   [phone] [phone].   Othern/a   FOLLOW UP TODAY BETWEEN 1-3 PM WITH DR. SHAPIRO.

## 2015-01-06 NOTE — Op Note (Signed)
Tilmon Wisehart T. Nile Riggs, MD  Procedure: Yag Capsulotomy  Yag Laser Self Test Completedyes. Procedure: Posterior Capsulotomy, Eye Protection Worn by Staff yes. Laser In Use Sign on Door yes.  Laser: Nd:YAG Spot Size: Fixed Burst Mode: III Power Setting: 3.4 mJ/burst Number of shots: 31 Total energy delivered: 99.49 mJ   The patient tolerated the procedure without difficulty. No complications were encountered.   The patient was discharged home with the instructions to continue all her current glaucoma medications, if any.   Patient instructed to go to office at 0100 for intraocular pressure check.  Patient verbalizes understanding of discharge instructions Yes.  .    Pre-Operative Diagnosis: After-Cataract, obscuring vision, 366.53 OS Post-Operative Diagnosis: After-Cataract, obscuring vision, 366.53 OS Date of Cataract Surgery: Unknown

## 2015-01-06 NOTE — H&P (Signed)
The patient was re examined and there is no change in the patients condition since the original H and P. 

## 2015-01-08 ENCOUNTER — Encounter (HOSPITAL_COMMUNITY): Payer: Self-pay | Admitting: Ophthalmology

## 2015-01-23 DIAGNOSIS — E1165 Type 2 diabetes mellitus with hyperglycemia: Secondary | ICD-10-CM | POA: Diagnosis not present

## 2015-01-23 DIAGNOSIS — I10 Essential (primary) hypertension: Secondary | ICD-10-CM | POA: Diagnosis not present

## 2015-01-27 DIAGNOSIS — I739 Peripheral vascular disease, unspecified: Secondary | ICD-10-CM | POA: Diagnosis not present

## 2015-01-27 DIAGNOSIS — E1142 Type 2 diabetes mellitus with diabetic polyneuropathy: Secondary | ICD-10-CM | POA: Diagnosis not present

## 2015-01-27 DIAGNOSIS — B351 Tinea unguium: Secondary | ICD-10-CM | POA: Diagnosis not present

## 2015-02-23 DIAGNOSIS — I10 Essential (primary) hypertension: Secondary | ICD-10-CM | POA: Diagnosis not present

## 2015-02-23 DIAGNOSIS — E1165 Type 2 diabetes mellitus with hyperglycemia: Secondary | ICD-10-CM | POA: Diagnosis not present

## 2015-03-25 DIAGNOSIS — E1165 Type 2 diabetes mellitus with hyperglycemia: Secondary | ICD-10-CM | POA: Diagnosis not present

## 2015-03-25 DIAGNOSIS — I10 Essential (primary) hypertension: Secondary | ICD-10-CM | POA: Diagnosis not present

## 2015-04-16 DIAGNOSIS — I10 Essential (primary) hypertension: Secondary | ICD-10-CM | POA: Diagnosis not present

## 2015-04-16 DIAGNOSIS — F209 Schizophrenia, unspecified: Secondary | ICD-10-CM | POA: Diagnosis not present

## 2015-04-16 DIAGNOSIS — E669 Obesity, unspecified: Secondary | ICD-10-CM | POA: Diagnosis not present

## 2015-04-16 DIAGNOSIS — F200 Paranoid schizophrenia: Secondary | ICD-10-CM | POA: Diagnosis not present

## 2015-04-16 DIAGNOSIS — E1165 Type 2 diabetes mellitus with hyperglycemia: Secondary | ICD-10-CM | POA: Diagnosis not present

## 2015-05-17 DIAGNOSIS — E1165 Type 2 diabetes mellitus with hyperglycemia: Secondary | ICD-10-CM | POA: Diagnosis not present

## 2015-05-17 DIAGNOSIS — I10 Essential (primary) hypertension: Secondary | ICD-10-CM | POA: Diagnosis not present

## 2015-05-26 DIAGNOSIS — I739 Peripheral vascular disease, unspecified: Secondary | ICD-10-CM | POA: Diagnosis not present

## 2015-05-26 DIAGNOSIS — E1142 Type 2 diabetes mellitus with diabetic polyneuropathy: Secondary | ICD-10-CM | POA: Diagnosis not present

## 2015-05-26 DIAGNOSIS — B351 Tinea unguium: Secondary | ICD-10-CM | POA: Diagnosis not present

## 2015-06-08 DIAGNOSIS — I10 Essential (primary) hypertension: Secondary | ICD-10-CM | POA: Diagnosis not present

## 2015-06-08 DIAGNOSIS — E1165 Type 2 diabetes mellitus with hyperglycemia: Secondary | ICD-10-CM | POA: Diagnosis not present

## 2015-07-02 DIAGNOSIS — F332 Major depressive disorder, recurrent severe without psychotic features: Secondary | ICD-10-CM | POA: Diagnosis not present

## 2015-07-15 DIAGNOSIS — E1165 Type 2 diabetes mellitus with hyperglycemia: Secondary | ICD-10-CM | POA: Diagnosis not present

## 2015-07-15 DIAGNOSIS — I10 Essential (primary) hypertension: Secondary | ICD-10-CM | POA: Diagnosis not present

## 2015-08-04 DIAGNOSIS — L851 Acquired keratosis [keratoderma] palmaris et plantaris: Secondary | ICD-10-CM | POA: Diagnosis not present

## 2015-08-04 DIAGNOSIS — E1142 Type 2 diabetes mellitus with diabetic polyneuropathy: Secondary | ICD-10-CM | POA: Diagnosis not present

## 2015-08-04 DIAGNOSIS — B351 Tinea unguium: Secondary | ICD-10-CM | POA: Diagnosis not present

## 2015-08-04 DIAGNOSIS — I739 Peripheral vascular disease, unspecified: Secondary | ICD-10-CM | POA: Diagnosis not present

## 2015-08-14 DIAGNOSIS — I10 Essential (primary) hypertension: Secondary | ICD-10-CM | POA: Diagnosis not present

## 2015-08-14 DIAGNOSIS — E1165 Type 2 diabetes mellitus with hyperglycemia: Secondary | ICD-10-CM | POA: Diagnosis not present

## 2015-09-14 DIAGNOSIS — I10 Essential (primary) hypertension: Secondary | ICD-10-CM | POA: Diagnosis not present

## 2015-09-14 DIAGNOSIS — E1165 Type 2 diabetes mellitus with hyperglycemia: Secondary | ICD-10-CM | POA: Diagnosis not present

## 2015-10-13 DIAGNOSIS — E1142 Type 2 diabetes mellitus with diabetic polyneuropathy: Secondary | ICD-10-CM | POA: Diagnosis not present

## 2015-10-13 DIAGNOSIS — I739 Peripheral vascular disease, unspecified: Secondary | ICD-10-CM | POA: Diagnosis not present

## 2015-10-13 DIAGNOSIS — B351 Tinea unguium: Secondary | ICD-10-CM | POA: Diagnosis not present

## 2015-10-22 DIAGNOSIS — F203 Undifferentiated schizophrenia: Secondary | ICD-10-CM | POA: Diagnosis not present

## 2015-10-22 DIAGNOSIS — I10 Essential (primary) hypertension: Secondary | ICD-10-CM | POA: Diagnosis not present

## 2015-10-22 DIAGNOSIS — E119 Type 2 diabetes mellitus without complications: Secondary | ICD-10-CM | POA: Diagnosis not present

## 2015-11-03 ENCOUNTER — Emergency Department (HOSPITAL_COMMUNITY): Payer: Medicare Other

## 2015-11-03 ENCOUNTER — Inpatient Hospital Stay (HOSPITAL_COMMUNITY)
Admission: EM | Admit: 2015-11-03 | Discharge: 2015-11-10 | DRG: 389 | Disposition: A | Discharge status: Home Health | Payer: Medicare Other | Attending: Internal Medicine | Admitting: Internal Medicine

## 2015-11-03 ENCOUNTER — Encounter (HOSPITAL_COMMUNITY): Payer: Self-pay | Admitting: Emergency Medicine

## 2015-11-03 DIAGNOSIS — N19 Unspecified kidney failure: Secondary | ICD-10-CM

## 2015-11-03 DIAGNOSIS — E876 Hypokalemia: Secondary | ICD-10-CM | POA: Diagnosis not present

## 2015-11-03 DIAGNOSIS — F319 Bipolar disorder, unspecified: Secondary | ICD-10-CM | POA: Diagnosis present

## 2015-11-03 DIAGNOSIS — R112 Nausea with vomiting, unspecified: Secondary | ICD-10-CM | POA: Diagnosis not present

## 2015-11-03 DIAGNOSIS — E7800 Pure hypercholesterolemia, unspecified: Secondary | ICD-10-CM | POA: Diagnosis present

## 2015-11-03 DIAGNOSIS — Z6841 Body Mass Index (BMI) 40.0 and over, adult: Secondary | ICD-10-CM | POA: Diagnosis not present

## 2015-11-03 DIAGNOSIS — K56609 Unspecified intestinal obstruction, unspecified as to partial versus complete obstruction: Secondary | ICD-10-CM | POA: Diagnosis present

## 2015-11-03 DIAGNOSIS — K5669 Other intestinal obstruction: Secondary | ICD-10-CM | POA: Diagnosis not present

## 2015-11-03 DIAGNOSIS — K219 Gastro-esophageal reflux disease without esophagitis: Secondary | ICD-10-CM | POA: Diagnosis present

## 2015-11-03 DIAGNOSIS — E860 Dehydration: Secondary | ICD-10-CM | POA: Diagnosis not present

## 2015-11-03 DIAGNOSIS — E871 Hypo-osmolality and hyponatremia: Secondary | ICD-10-CM | POA: Diagnosis present

## 2015-11-03 DIAGNOSIS — N179 Acute kidney failure, unspecified: Secondary | ICD-10-CM | POA: Diagnosis present

## 2015-11-03 DIAGNOSIS — N189 Chronic kidney disease, unspecified: Secondary | ICD-10-CM | POA: Diagnosis present

## 2015-11-03 DIAGNOSIS — E669 Obesity, unspecified: Secondary | ICD-10-CM | POA: Diagnosis present

## 2015-11-03 DIAGNOSIS — K5660 Unspecified intestinal obstruction: Secondary | ICD-10-CM | POA: Diagnosis not present

## 2015-11-03 DIAGNOSIS — E872 Acidosis: Secondary | ICD-10-CM | POA: Diagnosis present

## 2015-11-03 DIAGNOSIS — R000 Tachycardia, unspecified: Secondary | ICD-10-CM

## 2015-11-03 DIAGNOSIS — E1165 Type 2 diabetes mellitus with hyperglycemia: Secondary | ICD-10-CM | POA: Diagnosis not present

## 2015-11-03 DIAGNOSIS — I471 Supraventricular tachycardia: Secondary | ICD-10-CM | POA: Diagnosis not present

## 2015-11-03 DIAGNOSIS — I129 Hypertensive chronic kidney disease with stage 1 through stage 4 chronic kidney disease, or unspecified chronic kidney disease: Secondary | ICD-10-CM | POA: Diagnosis present

## 2015-11-03 DIAGNOSIS — F209 Schizophrenia, unspecified: Secondary | ICD-10-CM | POA: Diagnosis present

## 2015-11-03 DIAGNOSIS — E1122 Type 2 diabetes mellitus with diabetic chronic kidney disease: Secondary | ICD-10-CM | POA: Diagnosis present

## 2015-11-03 DIAGNOSIS — I10 Essential (primary) hypertension: Secondary | ICD-10-CM | POA: Diagnosis not present

## 2015-11-03 DIAGNOSIS — E869 Volume depletion, unspecified: Secondary | ICD-10-CM | POA: Diagnosis present

## 2015-11-03 DIAGNOSIS — R140 Abdominal distension (gaseous): Secondary | ICD-10-CM | POA: Diagnosis not present

## 2015-11-03 DIAGNOSIS — Z7982 Long term (current) use of aspirin: Secondary | ICD-10-CM | POA: Diagnosis not present

## 2015-11-03 HISTORY — DX: Schizophrenia, unspecified: F20.9

## 2015-11-03 HISTORY — DX: Gastro-esophageal reflux disease without esophagitis: K21.9

## 2015-11-03 HISTORY — DX: Bipolar disorder, unspecified: F31.9

## 2015-11-03 LAB — CBC WITH DIFFERENTIAL/PLATELET
BASOS ABS: 0 10*3/uL (ref 0.0–0.1)
Basophils Relative: 0 %
EOS PCT: 0 %
Eosinophils Absolute: 0 10*3/uL (ref 0.0–0.7)
HCT: 32.2 % — ABNORMAL LOW (ref 36.0–46.0)
HEMOGLOBIN: 10.8 g/dL — AB (ref 12.0–15.0)
LYMPHS PCT: 23 %
Lymphs Abs: 1.6 10*3/uL (ref 0.7–4.0)
MCH: 27.8 pg (ref 26.0–34.0)
MCHC: 33.5 g/dL (ref 30.0–36.0)
MCV: 82.8 fL (ref 78.0–100.0)
MONOS PCT: 18 %
Monocytes Absolute: 1.3 10*3/uL — ABNORMAL HIGH (ref 0.1–1.0)
NEUTROS ABS: 4.1 10*3/uL (ref 1.7–7.7)
Neutrophils Relative %: 59 %
Platelets: 230 10*3/uL (ref 150–400)
RBC: 3.89 MIL/uL (ref 3.87–5.11)
RDW: 14.6 % (ref 11.5–15.5)
WBC Morphology: INCREASED
WBC: 7 10*3/uL (ref 4.0–10.5)

## 2015-11-03 LAB — BASIC METABOLIC PANEL
Anion gap: 15 (ref 5–15)
BUN: 41 mg/dL — ABNORMAL HIGH (ref 6–20)
CO2: 18 mmol/L — ABNORMAL LOW (ref 22–32)
Calcium: 7.5 mg/dL — ABNORMAL LOW (ref 8.9–10.3)
Chloride: 91 mmol/L — ABNORMAL LOW (ref 101–111)
Creatinine, Ser: 2.96 mg/dL — ABNORMAL HIGH (ref 0.44–1.00)
GFR calc Af Amer: 17 mL/min — ABNORMAL LOW (ref >60)
GFR calc non Af Amer: 15 mL/min — ABNORMAL LOW (ref >60)
Glucose, Bld: 115 mg/dL — ABNORMAL HIGH (ref 65–99)
Potassium: 4.4 mmol/L (ref 3.5–5.1)
Sodium: 124 mmol/L — ABNORMAL LOW (ref 135–145)

## 2015-11-03 LAB — COMPREHENSIVE METABOLIC PANEL
ALBUMIN: 3.5 g/dL (ref 3.5–5.0)
ALT: 18 U/L (ref 14–54)
AST: 24 U/L (ref 15–41)
Alkaline Phosphatase: 42 U/L (ref 38–126)
Anion gap: 20 — ABNORMAL HIGH (ref 5–15)
BUN: 43 mg/dL — ABNORMAL HIGH (ref 6–20)
CHLORIDE: 86 mmol/L — AB (ref 101–111)
CO2: 14 mmol/L — AB (ref 22–32)
CREATININE: 3.27 mg/dL — AB (ref 0.44–1.00)
Calcium: 7.8 mg/dL — ABNORMAL LOW (ref 8.9–10.3)
GFR calc Af Amer: 15 mL/min — ABNORMAL LOW (ref >60)
GFR calc non Af Amer: 13 mL/min — ABNORMAL LOW (ref >60)
GLUCOSE: 136 mg/dL — AB (ref 65–99)
Potassium: 4.3 mmol/L (ref 3.5–5.1)
SODIUM: 120 mmol/L — AB (ref 135–145)
Total Bilirubin: 0.3 mg/dL (ref 0.3–1.2)
Total Protein: 7.2 g/dL (ref 6.5–8.1)

## 2015-11-03 LAB — URINALYSIS, ROUTINE W REFLEX MICROSCOPIC
Bilirubin Urine: NEGATIVE
Glucose, UA: NEGATIVE mg/dL
HGB URINE DIPSTICK: NEGATIVE
Ketones, ur: NEGATIVE mg/dL
Leukocytes, UA: NEGATIVE
Nitrite: NEGATIVE
Protein, ur: NEGATIVE mg/dL
SPECIFIC GRAVITY, URINE: 1.02 (ref 1.005–1.030)
pH: 5.5 (ref 5.0–8.0)

## 2015-11-03 LAB — PHOSPHORUS: Phosphorus: 5.5 mg/dL — ABNORMAL HIGH (ref 2.5–4.6)

## 2015-11-03 LAB — LIPASE, BLOOD: Lipase: <10 U/L — ABNORMAL LOW (ref 11–51)

## 2015-11-03 LAB — MAGNESIUM: Magnesium: 1.1 mg/dL — ABNORMAL LOW (ref 1.7–2.4)

## 2015-11-03 LAB — TROPONIN I

## 2015-11-03 MED ORDER — VALPROATE SODIUM 250 MG/5ML PO SOLN
250.0000 mg | Freq: Three times a day (TID) | ORAL | Status: DC
  Administered 2015-11-04 – 2015-11-10 (×18): 250 mg via ORAL
  Filled 2015-11-03 (×23): qty 5

## 2015-11-03 MED ORDER — LORAZEPAM 2 MG/ML IJ SOLN
1.0000 mg | Freq: Once | INTRAMUSCULAR | Status: AC
  Administered 2015-11-03: 1 mg via INTRAVENOUS
  Filled 2015-11-03: qty 1

## 2015-11-03 MED ORDER — LORATADINE 10 MG PO TABS
10.0000 mg | ORAL_TABLET | Freq: Every day | ORAL | Status: DC
  Administered 2015-11-05 – 2015-11-10 (×6): 10 mg via ORAL
  Filled 2015-11-03 (×6): qty 1

## 2015-11-03 MED ORDER — SODIUM CHLORIDE 0.9 % IV BOLUS (SEPSIS)
1000.0000 mL | Freq: Once | INTRAVENOUS | Status: AC
  Administered 2015-11-03: 1000 mL via INTRAVENOUS

## 2015-11-03 MED ORDER — ALBUTEROL SULFATE (2.5 MG/3ML) 0.083% IN NEBU: 3.0000 mL | INHALATION_SOLUTION | Freq: Four times a day (QID) | RESPIRATORY_TRACT | Status: DC | PRN

## 2015-11-03 MED ORDER — TRAZODONE HCL 50 MG PO TABS
50.0000 mg | ORAL_TABLET | Freq: Every day | ORAL | Status: DC
  Administered 2015-11-03 – 2015-11-09 (×7): 50 mg via ORAL
  Filled 2015-11-03 (×7): qty 1

## 2015-11-03 MED ORDER — ONDANSETRON HCL 4 MG/2ML IJ SOLN
4.0000 mg | Freq: Once | INTRAMUSCULAR | Status: AC
  Administered 2015-11-03: 4 mg via INTRAVENOUS
  Filled 2015-11-03: qty 2

## 2015-11-03 MED ORDER — PANTOPRAZOLE SODIUM 40 MG PO TBEC
40.0000 mg | DELAYED_RELEASE_TABLET | Freq: Every day | ORAL | Status: DC
  Administered 2015-11-05 – 2015-11-10 (×6): 40 mg via ORAL
  Filled 2015-11-03 (×6): qty 1

## 2015-11-03 MED ORDER — HEPARIN SODIUM (PORCINE) 5000 UNIT/ML IJ SOLN
5000.0000 [IU] | Freq: Three times a day (TID) | INTRAMUSCULAR | Status: DC
  Administered 2015-11-03 – 2015-11-10 (×19): 5000 [IU] via SUBCUTANEOUS
  Filled 2015-11-03 (×19): qty 1

## 2015-11-03 MED ORDER — BENZTROPINE MESYLATE 1 MG PO TABS
1.0000 mg | ORAL_TABLET | Freq: Two times a day (BID) | ORAL | Status: DC
  Administered 2015-11-03 – 2015-11-10 (×13): 1 mg via ORAL
  Filled 2015-11-03 (×13): qty 1

## 2015-11-03 MED ORDER — OLANZAPINE 5 MG PO TABS
20.0000 mg | ORAL_TABLET | Freq: Every day | ORAL | Status: DC
  Administered 2015-11-03 – 2015-11-09 (×7): 20 mg via ORAL
  Filled 2015-11-03 (×8): qty 4

## 2015-11-03 MED ORDER — RISPERIDONE 1 MG PO TABS
2.0000 mg | ORAL_TABLET | Freq: Two times a day (BID) | ORAL | Status: DC
  Administered 2015-11-03 – 2015-11-10 (×13): 2 mg via ORAL
  Filled 2015-11-03 (×13): qty 2

## 2015-11-03 MED ORDER — ATORVASTATIN CALCIUM 10 MG PO TABS
10.0000 mg | ORAL_TABLET | Freq: Every day | ORAL | Status: DC
  Administered 2015-11-03 – 2015-11-09 (×7): 10 mg via ORAL
  Filled 2015-11-03 (×7): qty 1

## 2015-11-03 MED ORDER — SODIUM CHLORIDE 0.9 % IV SOLN
INTRAVENOUS | Status: DC
  Administered 2015-11-03 – 2015-11-09 (×8): via INTRAVENOUS

## 2015-11-03 NOTE — ED Provider Notes (Signed)
[facility] Provider Note   CSN: [id number] Arrival date & time: 11/03/15  1400  First Provider Contact:  First MD Initiated Contact with Patient 11/03/15 1426        History   Chief Complaint Chief Complaint  Patient presents with  . Weakness    HPI Maria Pace is a 75 y.o. female.  Pt is here from assisted living due to n/v.  Pt said that she has not had a bm in a few days.     The history is provided by the patient.  Weakness     Past Medical History:  Diagnosis Date  . Bipolar 1 disorder (HCC)   . Diabetes mellitus without complication (HCC)   . GERD (gastroesophageal reflux disease)   . High cholesterol   . Hypertension   . Schizophrenia (HCC)     There are no active problems to display for this patient.   Past Surgical History:  Procedure Laterality Date  . YAG LASER APPLICATION Left 01/06/2015   Procedure: YAG LASER APPLICATION;  Surgeon: Jethro Bolus, MD;  Location: [facility];  Service: Ophthalmology;  Laterality: Left;    OB History    No data available       Home Medications    Prior to Admission medications   Medication Sig Start Date End Date Taking? Authorizing Provider  albuterol (PROAIR HFA) 108 (90 Base) MCG/ACT inhaler Inhale 2 puffs into the lungs every 6 (six) hours as needed for wheezing or shortness of breath.   Yes Historical Provider, MD  atorvastatin (LIPITOR) 10 MG tablet Take 10 mg by mouth daily.   Yes Historical Provider, MD  benztropine (COGENTIN) 1 MG tablet Take 1 mg by mouth 2 (two) times daily.   Yes Historical Provider, MD  docusate sodium (COLACE) 100 MG capsule Take 200 mg by mouth daily.   Yes Historical Provider, MD  ibuprofen (ADVIL,MOTRIN) 800 MG tablet Take 800 mg by mouth 2 (two) times daily.   Yes Historical Provider, MD  linagliptin (TRADJENTA) 5 MG TABS tablet Take 5 mg by mouth daily.   Yes Historical Provider, MD  lisinopril (PRINIVIL,ZESTRIL) 5 MG tablet Take 5 mg by mouth daily.   Yes Historical  Provider, MD  loratadine (CLARITIN) 10 MG tablet Take 10 mg by mouth daily.   Yes Historical Provider, MD  metFORMIN (GLUCOPHAGE) 1000 MG tablet Take 1,000 mg by mouth 2 (two) times daily with a meal.   Yes Historical Provider, MD  OLANZapine (ZYPREXA) 20 MG tablet Take 20 mg by mouth at bedtime.   Yes Historical Provider, MD  pantoprazole (PROTONIX) 40 MG tablet Take 40 mg by mouth daily.   Yes Historical Provider, MD  polyethylene glycol powder (GLYCOLAX/MIRALAX) powder Take 17 g by mouth daily as needed for mild constipation or moderate constipation.   Yes Historical Provider, MD  risperiDONE (RISPERDAL) 2 MG tablet Take 2 mg by mouth 2 (two) times daily.   Yes Historical Provider, MD  traZODone (DESYREL) 50 MG tablet Take 50 mg by mouth at bedtime.   Yes Historical Provider, MD  triamcinolone (KENALOG) 0.025 % ointment Apply 1 application topically 2 (two) times daily as needed (for irritation).   Yes Historical Provider, MD  Valproate Sodium (VALPROIC ACID) 250 MG/5ML SOLN Take 5 mLs by mouth 3 (three) times daily with meals.   Yes Historical Provider, MD    Family History No family history on file.  Social History Social History  Substance Use Topics  . Smoking status: Never Smoker  .  Smokeless tobacco: Never Used  . Alcohol use No     Allergies   Review of patient's allergies indicates no known allergies.   Review of Systems Review of Systems  Gastrointestinal: Positive for constipation, nausea and vomiting.  Neurological: Positive for weakness.  All other systems reviewed and are negative.    Physical Exam Updated Vital Signs BP 151/73 (BP Location: Left Arm)   Pulse 108   Temp 98.4 F (36.9 C) (Oral)   Resp 22   Ht 5' (1.524 m)   Wt 212 lb (96.2 kg)   SpO2 95%   BMI 41.40 kg/m   Physical Exam  Constitutional: She is oriented to person, place, and time. She appears well-developed and well-nourished.  HENT:  Head: Normocephalic and atraumatic.  Right Ear:  External ear normal.  Left Ear: External ear normal.  Nose: Nose normal.  Mouth/Throat: Oropharynx is clear and moist.  Eyes: Conjunctivae and EOM are normal. Pupils are equal, round, and reactive to light.  Neck: Normal range of motion. Neck supple.  Cardiovascular: Regular rhythm, normal heart sounds and intact distal pulses.  Tachycardia present.   Pulmonary/Chest: Effort normal and breath sounds normal.  Abdominal: Soft. She exhibits distension.  Musculoskeletal: Normal range of motion.  Neurological: She is alert and oriented to person, place, and time.  Skin: Skin is warm and dry.  Psychiatric: She has a normal mood and affect. Her behavior is normal. Judgment and thought content normal.  Nursing note and vitals reviewed.    ED Treatments / Results  Labs (all labs ordered are listed, but only abnormal results are displayed) Labs Reviewed  CBC WITH DIFFERENTIAL/PLATELET - Abnormal; Notable for the following:       Result Value   Hemoglobin 10.8 (*)    HCT 32.2 (*)    Monocytes Absolute 1.3 (*)    All other components within normal limits  COMPREHENSIVE METABOLIC PANEL - Abnormal; Notable for the following:    Sodium 120 (*)    Chloride 86 (*)    CO2 14 (*)    Glucose, Bld 136 (*)    BUN 43 (*)    Creatinine, Ser 3.27 (*)    Calcium 7.8 (*)    GFR calc non Af Amer 13 (*)    GFR calc Af Amer 15 (*)    Anion gap 20 (*)    All other components within normal limits  LIPASE, BLOOD - Abnormal; Notable for the following:    Lipase <10 (*)    All other components within normal limits  URINALYSIS, ROUTINE W REFLEX MICROSCOPIC (NOT AT [facility])  TROPONIN I    EKG  EKG Interpretation  Date/Time:  Tuesday November 03 2015 14:29:33 EDT Ventricular Rate:  124 PR Interval:    QRS Duration: 88 QT Interval:  310 QTC Calculation: 422 R Axis:   13 Text Interpretation:  Sinus tachycardia Multiple premature complexes, vent & supraven Borderline low voltage, extremity leads Confirmed  by Chantilly Linskey MD, Karuna Balducci ([id number]) on 11/03/2015 2:51:11 PM       Radiology Ct Abdomen Pelvis Wo Contrast  Result Date: 11/03/2015 CLINICAL DATA:  Nausea vomiting for 3 days. Increasing weakness with shortness of breath today. EXAM: CT ABDOMEN AND PELVIS WITHOUT CONTRAST TECHNIQUE: Multidetector CT imaging of the abdomen and pelvis was performed following the standard protocol without IV contrast. COMPARISON:  Current abdominal radiographs suggesting a bowel obstruction. FINDINGS: Lung bases: Heart is mildly enlarged. Mild lung base subsegmental atelectasis. Minimal right pleural effusion. Liver, spleen, gallbladder, pancreas,  adrenal glands: Unremarkable. Kidneys, ureters, bladder:  Unremarkable. Uterus and adnexa:  Unremarkable. Lymph nodes:  No adenopathy. Ascites: Small amount pelvic free fluid. Vascular: Mild atherosclerotic calcifications along a normal caliber infrarenal abdominal aorta. Gastrointestinal: Stomach is distended as is the proximal to mid small bowel with the distal ileum decompressed. There is a transition point in the right mid abdomen. Colon is normal in caliber. There is no bowel wall thickening or adjacent inflammatory change. A normal appendix is visualized. No free air. Musculoskeletal: There are degenerative changes throughout the visualized spine. No osteoblastic or osteolytic lesions. IMPRESSION: 1. Moderate to high-grade small bowel obstruction with the transition point noted in the right mid abdomen, likely due to adhesions. 2. No other acute abnormalities. Electronically Signed   By: Amie Portlandavid  Ormond M.D.   On: 11/03/2015 16:52   Dg Abd Acute W/chest  Result Date: 11/03/2015 CLINICAL DATA:  Nausea, vomiting, and diarrhea since Sunday. Abdominal pain and distention. EXAM: DG ABDOMEN ACUTE W/ 1V CHEST COMPARISON:  10/02/2014 chest radiograph FINDINGS: Mild enlargement of the cardiopericardial silhouette. Stable small notch like left apical density, no change from 07/20/2013, likely  from scarring. Subsegmental atelectasis at the right lung base. Low lung volumes are present, causing crowding of the pulmonary vasculature. Dilated loops of small bowel up to 6 cm in diameter, with air- fluid levels at differing vertical heights in some loops, suggesting small bowel obstruction. Paucity of gas in the right lower quadrant, questionable mass effect or mass lesion. There is some formed stool in the descending colon. The rack a lumbar spondylosis. No compelling findings of free intraperitoneal gas. IMPRESSION: 1. Dilated loops of small bowel with air-fluid levels suggesting small bowel obstruction. 2. Paucity of right lower quadrant bowel, query mass lesion in this region. 3. CT of the abdomen and pelvis is suggested for further workup. 4. Subsegmental atelectasis at the right lung base with low lung volumes. 5. Mild enlargement of the cardiopericardial silhouette. Electronically Signed   By: Gaylyn RongWalter  Liebkemann M.D.   On: 11/03/2015 15:02    Procedures Procedures (including critical care time)  Medications Ordered in ED Medications  LORazepam (ATIVAN) injection 1 mg (not administered)  ondansetron (ZOFRAN) injection 4 mg (4 mg Intravenous Given 11/03/15 1457)  sodium chloride 0.9 % bolus 1,000 mL (1,000 mLs Intravenous New Bag/Given 11/03/15 1457)  sodium chloride 0.9 % bolus 1,000 mL (1,000 mLs Intravenous New Bag/Given 11/03/15 1632)     Initial Impression / Assessment and Plan / ED Course  I have reviewed the triage vital signs and the nursing notes.  Pertinent labs & imaging results that were available during my care of the patient were reviewed by me and considered in my medical decision making (see chart for details).  Clinical Course   Pt d/w Dr. Lovell SheehanJenkins (surgery) who will see pt in consult.  He requested that the hospitalists admit.  Pt d/w Dr. Dartha Lodgegbata who will admit.  Prior to NG placement, pt's HR went up to the 150s and BP dropped to 90.  She was given an additional 1 L of  IVF.   Final Clinical Impressions(s) / ED Diagnoses   Final diagnoses:  Acute renal failure, unspecified acute renal failure type (HCC)  Small bowel obstruction [facility](HCC)    New Prescriptions New Prescriptions   No medications on file     Jacalyn LefevreJulie [name], MD 11/04/15 1257

## 2015-11-03 NOTE — ED Notes (Signed)
Caretaker here now. States patient has had some diarrhea and vomiting off and on since Sunday. Also, states pt has not been able to void

## 2015-11-03 NOTE — ED Notes (Signed)
Heart rate at times has been elevated at times up to 150's - 160's. Repeat EKG done and ED aware and in to re eval

## 2015-11-03 NOTE — ED Triage Notes (Signed)
Pt is at assisted living facility. Per Caregiver pt has had n/v/d since Sunday with increasing weakness and sob with excertion today.

## 2015-11-03 NOTE — H&P (Signed)
History and Physical  Maria Pace YNW:[id number] DOB: [birth year] DOA: 11/03/2015  Referring physician: ER Physician PCP: Avon Gully, MD  Outpatient Specialists:    Patient coming from: Home  Chief Complaint: Abdominal pain and distention  HPI: 75 year old female with history of bowel obstruction. Patient that her abdomen became distended yesterday, with associated abdominal pain, nausea, vomiting and abdominal pain. CT Scan of the abdomen done revealed moderate to high grade SBO. ER Physician discussed with the Surgeon on call and the Surgeon asked the Hospitalist team to admit the patient. No headache or neck pain, no fever or chills, no chest pain, no neck pain. Patient is not sure if she has broken the wind.  ED Course: NPO. NG tube to low suction. IVF  Pertinent labs: Sodium is 120, BUN is 43, Scr of 3.27 and CO2 of 14 Imaging: independently reviewed.   Review of Systems:  As in HPI. Negative for fever, visual changes, sore throat, rash, new muscle aches, chest pain, SOB, dysuria, bleeding, n/v.  Past Medical History:  Diagnosis Date  . Bipolar 1 disorder (HCC)   . Diabetes mellitus without complication (HCC)   . GERD (gastroesophageal reflux disease)   . High cholesterol   . Hypertension   . Schizophrenia [facility])     Past Surgical History:  Procedure Laterality Date  . YAG LASER APPLICATION Left 01/06/2015   Procedure: YAG LASER APPLICATION;  Surgeon: Jethro Bolus, MD;  Location: [facility];  Service: Ophthalmology;  Laterality: Left;     reports that she has never smoked. She has never used smokeless tobacco. She reports that she does not drink alcohol or use drugs.  No Known Allergies  No family history on file.   Prior to Admission medications   Medication Sig Start Date End Date Taking? Authorizing Provider  albuterol (PROAIR HFA) 108 (90 Base) MCG/ACT inhaler Inhale 2 puffs into the lungs every 6 (six) hours as needed for wheezing or shortness of breath.   Yes  Historical Provider, MD  atorvastatin (LIPITOR) 10 MG tablet Take 10 mg by mouth daily.   Yes Historical Provider, MD  benztropine (COGENTIN) 1 MG tablet Take 1 mg by mouth 2 (two) times daily.   Yes Historical Provider, MD  docusate sodium (COLACE) 100 MG capsule Take 200 mg by mouth daily.   Yes Historical Provider, MD  ibuprofen (ADVIL,MOTRIN) 800 MG tablet Take 800 mg by mouth 2 (two) times daily.   Yes Historical Provider, MD  linagliptin (TRADJENTA) 5 MG TABS tablet Take 5 mg by mouth daily.   Yes Historical Provider, MD  lisinopril (PRINIVIL,ZESTRIL) 5 MG tablet Take 5 mg by mouth daily.   Yes Historical Provider, MD  loratadine (CLARITIN) 10 MG tablet Take 10 mg by mouth daily.   Yes Historical Provider, MD  metFORMIN (GLUCOPHAGE) 1000 MG tablet Take 1,000 mg by mouth 2 (two) times daily with a meal.   Yes Historical Provider, MD  OLANZapine (ZYPREXA) 20 MG tablet Take 20 mg by mouth at bedtime.   Yes Historical Provider, MD  pantoprazole (PROTONIX) 40 MG tablet Take 40 mg by mouth daily.   Yes Historical Provider, MD  polyethylene glycol powder (GLYCOLAX/MIRALAX) powder Take 17 g by mouth daily as needed for mild constipation or moderate constipation.   Yes Historical Provider, MD  risperiDONE (RISPERDAL) 2 MG tablet Take 2 mg by mouth 2 (two) times daily.   Yes Historical Provider, MD  traZODone (DESYREL) 50 MG tablet Take 50 mg by mouth at bedtime.  Yes Historical Provider, MD  triamcinolone (KENALOG) 0.025 % ointment Apply 1 application topically 2 (two) times daily as needed (for irritation).   Yes Historical Provider, MD  Valproate Sodium (VALPROIC ACID) 250 MG/5ML SOLN Take 5 mLs by mouth 3 (three) times daily with meals.   Yes Historical Provider, MD    Physical Exam: Vitals:   11/03/15 1417 11/03/15 1633 11/03/15 1811  BP: 119/75 151/73 136/71  Pulse: 115 108 111  Resp: 20 22 15   Temp: 98.4 F (36.9 C)    TempSrc: Oral    SpO2: 95% 95% 97%  Weight: 96.2 kg (212 lb)      Height: 5' (1.524 m)     Constitutional:  . Appears calm and comfortable. Obese Eyes:  . No pallor. No jaundice.  ENMT:  . external ears, nose appear normal Neck:  . Neck is supple. No JVD Respiratory:  . CTA bilaterally, no w/r/r.  . Respiratory effort normal. No retractions or accessory muscle use Cardiovascular:  . S1S2 . No LE extremity edema   Abdomen:  . Abdomen is distended (Gaseous), but soft. Organs are difficult to assess. BS is hyperactive Neurologic:  . Awake and alert. . Moves all limbs.  Wt Readings from Last 3 Encounters:  11/03/15 96.2 kg (212 lb)    I have personally reviewed following labs and imaging studies  Labs on Admission:  CBC:  Recent Labs Lab 11/03/15 1438  WBC 7.0  NEUTROABS 4.1  HGB 10.8*  HCT 32.2*  MCV 82.8  PLT 230   Basic Metabolic Panel:  Recent Labs Lab 11/03/15 1438  NA 120*  K 4.3  CL 86*  CO2 14*  GLUCOSE 136*  BUN 43*  CREATININE 3.27*  CALCIUM 7.8*   Liver Function Tests:  Recent Labs Lab 11/03/15 1438  AST 24  ALT 18  ALKPHOS 42  BILITOT 0.3  PROT 7.2  ALBUMIN 3.5    Recent Labs Lab 11/03/15 1438  LIPASE <10*   No results for input(s): AMMONIA in the last 168 hours. Coagulation Profile: No results for input(s): INR, PROTIME in the last 168 hours. Cardiac Enzymes:  Recent Labs Lab 11/03/15 1438  TROPONINI <0.03   BNP (last 3 results) No results for input(s): PROBNP in the last 8760 hours. HbA1C: No results for input(s): HGBA1C in the last 72 hours. CBG: No results for input(s): GLUCAP in the last 168 hours. Lipid Profile: No results for input(s): CHOL, HDL, LDLCALC, TRIG, CHOLHDL, LDLDIRECT in the last 72 hours. Thyroid Function Tests: No results for input(s): TSH, T4TOTAL, FREET4, T3FREE, THYROIDAB in the last 72 hours. Anemia Panel: No results for input(s): VITAMINB12, FOLATE, FERRITIN, TIBC, IRON, RETICCTPCT in the last 72 hours. Urine analysis:    Component Value Date/Time    COLORURINE YELLOW 11/03/2015 1545   APPEARANCEUR CLEAR 11/03/2015 1545   APPEARANCEUR Clear 10/28/2013 0457   LABSPEC 1.020 11/03/2015 1545   LABSPEC 1.005 10/28/2013 0457   PHURINE 5.5 11/03/2015 1545   GLUCOSEU NEGATIVE 11/03/2015 1545   GLUCOSEU 50 mg/dL 16/10/960408/05/2013 [id number]   HGBUR NEGATIVE 11/03/2015 1545   BILIRUBINUR NEGATIVE 11/03/2015 1545   BILIRUBINUR Negative 10/28/2013 0457   KETONESUR NEGATIVE 11/03/2015 1545   PROTEINUR NEGATIVE 11/03/2015 1545   NITRITE NEGATIVE 11/03/2015 1545   LEUKOCYTESUR NEGATIVE 11/03/2015 1545   LEUKOCYTESUR 3+ 10/28/2013 0457   Sepsis Labs: @LABRCNTIP (procalcitonin:4,lacticidven:4) )No results found for this or any previous visit (from the past 240 hour(s)).    Radiological Exams on Admission: Ct Abdomen Pelvis Wo Contrast  Result Date: 11/03/2015 CLINICAL DATA:  Nausea vomiting for 3 days. Increasing weakness with shortness of breath today. EXAM: CT ABDOMEN AND PELVIS WITHOUT CONTRAST TECHNIQUE: Multidetector CT imaging of the abdomen and pelvis was performed following the standard protocol without IV contrast. COMPARISON:  Current abdominal radiographs suggesting a bowel obstruction. FINDINGS: Lung bases: Heart is mildly enlarged. Mild lung base subsegmental atelectasis. Minimal right pleural effusion. Liver, spleen, gallbladder, pancreas, adrenal glands: Unremarkable. Kidneys, ureters, bladder:  Unremarkable. Uterus and adnexa:  Unremarkable. Lymph nodes:  No adenopathy. Ascites: Small amount pelvic free fluid. Vascular: Mild atherosclerotic calcifications along a normal caliber infrarenal abdominal aorta. Gastrointestinal: Stomach is distended as is the proximal to mid small bowel with the distal ileum decompressed. There is a transition point in the right mid abdomen. Colon is normal in caliber. There is no bowel wall thickening or adjacent inflammatory change. A normal appendix is visualized. No free air. Musculoskeletal: There are degenerative  changes throughout the visualized spine. No osteoblastic or osteolytic lesions. IMPRESSION: 1. Moderate to high-grade small bowel obstruction with the transition point noted in the right mid abdomen, likely due to adhesions. 2. No other acute abnormalities. Electronically Signed   By: Amie Portland M.D.   On: 11/03/2015 16:52   Dg Abd Acute W/chest  Result Date: 11/03/2015 CLINICAL DATA:  Nausea, vomiting, and diarrhea since Sunday. Abdominal pain and distention. EXAM: DG ABDOMEN ACUTE W/ 1V CHEST COMPARISON:  10/02/2014 chest radiograph FINDINGS: Mild enlargement of the cardiopericardial silhouette. Stable small notch like left apical density, no change from 07/20/2013, likely from scarring. Subsegmental atelectasis at the right lung base. Low lung volumes are present, causing crowding of the pulmonary vasculature. Dilated loops of small bowel up to 6 cm in diameter, with air- fluid levels at differing vertical heights in some loops, suggesting small bowel obstruction. Paucity of gas in the right lower quadrant, questionable mass effect or mass lesion. There is some formed stool in the descending colon. The rack a lumbar spondylosis. No compelling findings of free intraperitoneal gas. IMPRESSION: 1. Dilated loops of small bowel with air-fluid levels suggesting small bowel obstruction. 2. Paucity of right lower quadrant bowel, query mass lesion in this region. 3. CT of the abdomen and pelvis is suggested for further workup. 4. Subsegmental atelectasis at the right lung base with low lung volumes. 5. Mild enlargement of the cardiopericardial silhouette. Electronically Signed   By: Gaylyn Rong M.D.   On: 11/03/2015 15:02    Active Problems:   SBO (small bowel obstruction) (HCC)   Assessment/Plan 1. Small bowel obstruction 2. Volume depletion 3. AKI 4. Hyponatremia         5. 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 Low threshold to consult Nephrology if AKI is not resolving   Admission status: Inpatient    Time spent: Greater than 60 minutes  Berton Mount, MD  [facility] Pager #: [phone] 7PM-7AM contact night coverage as above   11/03/2015, 6:55 PM

## 2015-11-03 NOTE — ED Notes (Signed)
1000 cc brown thick liquid returned immediately

## 2015-11-04 ENCOUNTER — Inpatient Hospital Stay (HOSPITAL_COMMUNITY): Payer: Medicare Other

## 2015-11-04 LAB — COMPREHENSIVE METABOLIC PANEL
ALT: 15 U/L (ref 14–54)
AST: 18 U/L (ref 15–41)
Albumin: 3.1 g/dL — ABNORMAL LOW (ref 3.5–5.0)
Alkaline Phosphatase: 39 U/L (ref 38–126)
Anion gap: 11 (ref 5–15)
BUN: 39 mg/dL — ABNORMAL HIGH (ref 6–20)
CO2: 21 mmol/L — ABNORMAL LOW (ref 22–32)
Calcium: 7.6 mg/dL — ABNORMAL LOW (ref 8.9–10.3)
Chloride: 94 mmol/L — ABNORMAL LOW (ref 101–111)
Creatinine, Ser: 2.62 mg/dL — ABNORMAL HIGH (ref 0.44–1.00)
GFR calc Af Amer: 20 mL/min — ABNORMAL LOW (ref >60)
GFR calc non Af Amer: 17 mL/min — ABNORMAL LOW (ref >60)
Glucose, Bld: 119 mg/dL — ABNORMAL HIGH (ref 65–99)
Potassium: 4.2 mmol/L (ref 3.5–5.1)
Sodium: 126 mmol/L — ABNORMAL LOW (ref 135–145)
Total Bilirubin: 0.2 mg/dL — ABNORMAL LOW (ref 0.3–1.2)
Total Protein: 6.4 g/dL — ABNORMAL LOW (ref 6.5–8.1)

## 2015-11-04 LAB — SODIUM, URINE, RANDOM: Sodium, Ur: 56 mmol/L

## 2015-11-04 LAB — BASIC METABOLIC PANEL
Anion gap: 10 (ref 5–15)
BUN: 38 mg/dL — ABNORMAL HIGH (ref 6–20)
CO2: 21 mmol/L — ABNORMAL LOW (ref 22–32)
Calcium: 7.4 mg/dL — ABNORMAL LOW (ref 8.9–10.3)
Chloride: 97 mmol/L — ABNORMAL LOW (ref 101–111)
Creatinine, Ser: 2.46 mg/dL — ABNORMAL HIGH (ref 0.44–1.00)
GFR calc Af Amer: 21 mL/min — ABNORMAL LOW (ref >60)
GFR calc non Af Amer: 18 mL/min — ABNORMAL LOW (ref >60)
Glucose, Bld: 103 mg/dL — ABNORMAL HIGH (ref 65–99)
Potassium: 4 mmol/L (ref 3.5–5.1)
Sodium: 128 mmol/L — ABNORMAL LOW (ref 135–145)

## 2015-11-04 LAB — GLUCOSE, CAPILLARY: Glucose-Capillary: 109 mg/dL — ABNORMAL HIGH (ref 65–99)

## 2015-11-04 LAB — CREATININE, URINE, RANDOM: Creatinine, Urine: 50.19 mg/dL

## 2015-11-04 LAB — PROTEIN, URINE, RANDOM: Total Protein, Urine: 21 mg/dL

## 2015-11-04 LAB — CBC
HCT: 30.5 % — ABNORMAL LOW (ref 36.0–46.0)
Hemoglobin: 10.3 g/dL — ABNORMAL LOW (ref 12.0–15.0)
MCH: 27.9 pg (ref 26.0–34.0)
MCHC: 33.8 g/dL (ref 30.0–36.0)
MCV: 82.7 fL (ref 78.0–100.0)
Platelets: 226 10*3/uL (ref 150–400)
RBC: 3.69 MIL/uL — ABNORMAL LOW (ref 3.87–5.11)
RDW: 14.1 % (ref 11.5–15.5)
WBC: 5.5 10*3/uL (ref 4.0–10.5)

## 2015-11-04 LAB — MRSA PCR SCREENING: MRSA by PCR: NEGATIVE

## 2015-11-04 NOTE — Care Management Note (Signed)
Case Management Note  Patient Details  Name: Maria Pace MRN: [id number] Date of Birth: 10/18/1940  Subjective/Objective:   Patient is from ALF, adm with SBO. Her PCP is Dr. Felecia ShellingFanta, she has medicare and reports no issues.  Action/Plan: Anticipate DC back to ALF, CSW aware and will make arrangements.   Expected Discharge Date:                  Expected Discharge Plan:     In-House Referral:     Discharge planning Services     Post Acute Care Choice:    Choice offered to:     DME Arranged:    DME Agency:     HH Arranged:    HH Agency:     Status of Service:     If discussed at [employer] Meetings, dates discussed:    Additional Comments:  Maria Pace, Maria OilerSharley Diane, RN 11/04/2015, 2:27 PM

## 2015-11-04 NOTE — Consult Note (Signed)
Reason for Consult: Small bowel obstruction Referring Physician: Dr. [name] is an 75 y.o. female.  HPI: Patient is a 76 year old black female with schizophrenia and bipolar disorder who presented emergency room with worsening abdominal distention, nausea, and vomiting. CT scan the abdomen was performed which revealed a small bowel obstruction. She was also noted to have renal insufficiency and hyponatremia. She was admitted to the hospital for further evaluation and treatment. An NG tube was placed which has put out over 1 L of fluid. I tried to get a history from her but her mentation is slowed. She does not remember when she last passed gas. She thought she might have a bowel movement 2 days ago. She denies any abdominal surgery.  Past Medical History:  Diagnosis Date  . Bipolar 1 disorder ([place])   . Diabetes mellitus without complication ([place])   . GERD (gastroesophageal reflux disease)   . High cholesterol   . Hypertension   . Schizophrenia [facility])     Past Surgical History:  Procedure Laterality Date  . YAG LASER APPLICATION Left [id number]   Procedure: YAG LASER APPLICATION;  Surgeon: Rutherford Guys, MD;  Location: [facility];  Service: Ophthalmology;  Laterality: Left;    No family history on file.  Social History:  reports that she has never smoked. She has never used smokeless tobacco. She reports that she does not drink alcohol or use drugs.  Allergies: No Known Allergies  Medications:  Scheduled: . atorvastatin  10 mg Oral q1800  . benztropine  1 mg Oral BID  . heparin  5,000 Units Subcutaneous Q8H  . loratadine  10 mg Oral Daily  . OLANZapine  20 mg Oral QHS  . pantoprazole  40 mg Oral Daily  . risperiDONE  2 mg Oral BID  . traZODone  50 mg Oral QHS  . Valproate Sodium  250 mg Oral TID WC   Continuous: . sodium chloride 75 mL/hr at 11/03/15 2100    Results for orders placed or performed during the hospital encounter of 11/03/15 (from the past 48 hour(s))   CBC with Differential     Status: Abnormal   Collection Time: 11/03/15  2:38 PM  Result Value Ref Range   WBC 7.0 4.0 - 10.5 K/uL   RBC 3.89 3.87 - 5.11 MIL/uL   Hemoglobin 10.8 (L) 12.0 - 15.0 g/dL   HCT 32.2 (L) 36.0 - 46.0 %   MCV 82.8 78.0 - 100.0 fL   MCH 27.8 26.0 - 34.0 pg   MCHC 33.5 30.0 - 36.0 g/dL   RDW 14.6 11.5 - 15.5 %   Platelets 230 150 - 400 K/uL   Neutrophils Relative % 59 %   Lymphocytes Relative 23 %   Monocytes Relative 18 %   Eosinophils Relative 0 %   Basophils Relative 0 %   Neutro Abs 4.1 1.7 - 7.7 K/uL   Lymphs Abs 1.6 0.7 - 4.0 K/uL   Monocytes Absolute 1.3 (H) 0.1 - 1.0 K/uL   Eosinophils Absolute 0.0 0.0 - 0.7 K/uL   Basophils Absolute 0.0 0.0 - 0.1 K/uL   WBC Morphology INCREASED BANDS (>20% BANDS)     Comment: ATYPICAL LYMPHOCYTES   Smear Review LARGE PLATELETS PRESENT   Comprehensive metabolic panel     Status: Abnormal   Collection Time: 11/03/15  2:38 PM  Result Value Ref Range   Sodium 120 (L) 135 - 145 mmol/L   Potassium 4.3 3.5 - 5.1 mmol/L   Chloride 86 (L)  101 - 111 mmol/L   CO2 14 (L) 22 - 32 mmol/L   Glucose, Bld 136 (H) 65 - 99 mg/dL   BUN 43 (H) 6 - 20 mg/dL   Creatinine, Ser 3.27 (H) 0.44 - 1.00 mg/dL   Calcium 7.8 (L) 8.9 - 10.3 mg/dL   Total Protein 7.2 6.5 - 8.1 g/dL   Albumin 3.5 3.5 - 5.0 g/dL   AST 24 15 - 41 U/L   ALT 18 14 - 54 U/L   Alkaline Phosphatase 42 38 - 126 U/L   Total Bilirubin 0.3 0.3 - 1.2 mg/dL   GFR calc non Af Amer 13 (L) >60 mL/min   GFR calc Af Amer 15 (L) >60 mL/min    Comment: (NOTE) The eGFR has been calculated using the CKD EPI equation. This calculation has not been validated in all clinical situations. eGFR's persistently <60 mL/min signify possible Chronic Kidney Disease.    Anion gap 20 (H) 5 - 15  Lipase, blood     Status: Abnormal   Collection Time: 11/03/15  2:38 PM  Result Value Ref Range   Lipase <10 (L) 11 - 51 U/L  Troponin I     Status: None   Collection Time: 11/03/15  2:38  PM  Result Value Ref Range   Troponin I <0.03 <0.03 ng/mL  Urinalysis, Routine w reflex microscopic (not at [facility])     Status: None   Collection Time: 11/03/15  3:45 PM  Result Value Ref Range   Color, Urine YELLOW YELLOW   APPearance CLEAR CLEAR   Specific Gravity, Urine 1.020 1.005 - 1.030   pH 5.5 5.0 - 8.0   Glucose, UA NEGATIVE NEGATIVE mg/dL   Hgb urine dipstick NEGATIVE NEGATIVE   Bilirubin Urine NEGATIVE NEGATIVE   Ketones, ur NEGATIVE NEGATIVE mg/dL   Protein, ur NEGATIVE NEGATIVE mg/dL   Nitrite NEGATIVE NEGATIVE   Leukocytes, UA NEGATIVE NEGATIVE    Comment: MICROSCOPIC NOT DONE ON URINES WITH NEGATIVE PROTEIN, BLOOD, LEUKOCYTES, NITRITE, OR GLUCOSE <1000 mg/dL.  Troponin I     Status: None   Collection Time: 11/03/15  7:15 PM  Result Value Ref Range   Troponin I <0.03 <0.03 ng/mL  Magnesium     Status: Abnormal   Collection Time: 11/03/15  7:15 PM  Result Value Ref Range   Magnesium 1.1 (L) 1.7 - 2.4 mg/dL  Phosphorus     Status: Abnormal   Collection Time: 11/03/15  7:15 PM  Result Value Ref Range   Phosphorus 5.5 (H) 2.5 - 4.6 mg/dL  Basic metabolic panel     Status: Abnormal   Collection Time: 11/03/15  7:15 PM  Result Value Ref Range   Sodium 124 (L) 135 - 145 mmol/L   Potassium 4.4 3.5 - 5.1 mmol/L   Chloride 91 (L) 101 - 111 mmol/L   CO2 18 (L) 22 - 32 mmol/L   Glucose, Bld 115 (H) 65 - 99 mg/dL   BUN 41 (H) 6 - 20 mg/dL   Creatinine, Ser 2.96 (H) 0.44 - 1.00 mg/dL   Calcium 7.5 (L) 8.9 - 10.3 mg/dL   GFR calc non Af Amer 15 (L) >60 mL/min   GFR calc Af Amer 17 (L) >60 mL/min    Comment: (NOTE) The eGFR has been calculated using the CKD EPI equation. This calculation has not been validated in all clinical situations. eGFR's persistently <60 mL/min signify possible Chronic Kidney Disease.    Anion gap 15 5 - 15  MRSA PCR Screening  Status: None   Collection Time: 11/03/15 10:00 PM  Result Value Ref Range   MRSA by PCR NEGATIVE NEGATIVE     Comment:        The GeneXpert MRSA Assay (FDA approved for NASAL specimens only), is one component of a comprehensive MRSA colonization surveillance program. It is not intended to diagnose MRSA infection nor to guide or monitor treatment for MRSA infections.   Sodium, urine, random     Status: None   Collection Time: 11/03/15 11:30 PM  Result Value Ref Range   Sodium, Ur 56 mmol/L  Creatinine, urine, random     Status: None   Collection Time: 11/03/15 11:30 PM  Result Value Ref Range   Creatinine, Urine 50.19 mg/dL  Protein, urine, random     Status: None   Collection Time: 11/03/15 11:30 PM  Result Value Ref Range   Total Protein, Urine 21 mg/dL    Comment: NO NORMAL RANGE ESTABLISHED FOR THIS TEST  Comprehensive metabolic panel     Status: Abnormal   Collection Time: 11/04/15  2:59 AM  Result Value Ref Range   Sodium 126 (L) 135 - 145 mmol/L   Potassium 4.2 3.5 - 5.1 mmol/L   Chloride 94 (L) 101 - 111 mmol/L   CO2 21 (L) 22 - 32 mmol/L   Glucose, Bld 119 (H) 65 - 99 mg/dL   BUN 39 (H) 6 - 20 mg/dL   Creatinine, Ser 2.62 (H) 0.44 - 1.00 mg/dL   Calcium 7.6 (L) 8.9 - 10.3 mg/dL   Total Protein 6.4 (L) 6.5 - 8.1 g/dL   Albumin 3.1 (L) 3.5 - 5.0 g/dL   AST 18 15 - 41 U/L   ALT 15 14 - 54 U/L   Alkaline Phosphatase 39 38 - 126 U/L   Total Bilirubin 0.2 (L) 0.3 - 1.2 mg/dL   GFR calc non Af Amer 17 (L) >60 mL/min   GFR calc Af Amer 20 (L) >60 mL/min    Comment: (NOTE) The eGFR has been calculated using the CKD EPI equation. This calculation has not been validated in all clinical situations. eGFR's persistently <60 mL/min signify possible Chronic Kidney Disease.    Anion gap 11 5 - 15  CBC     Status: Abnormal   Collection Time: 11/04/15  2:59 AM  Result Value Ref Range   WBC 5.5 4.0 - 10.5 K/uL   RBC 3.69 (L) 3.87 - 5.11 MIL/uL   Hemoglobin 10.3 (L) 12.0 - 15.0 g/dL   HCT 30.5 (L) 36.0 - 46.0 %   MCV 82.7 78.0 - 100.0 fL   MCH 27.9 26.0 - 34.0 pg   MCHC 33.8  30.0 - 36.0 g/dL   RDW 14.1 11.5 - 15.5 %   Platelets 226 150 - 400 K/uL    Ct Abdomen Pelvis Wo Contrast  Result Date: 11/03/2015 CLINICAL DATA:  Nausea vomiting for 3 days. Increasing weakness with shortness of breath today. EXAM: CT ABDOMEN AND PELVIS WITHOUT CONTRAST TECHNIQUE: Multidetector CT imaging of the abdomen and pelvis was performed following the standard protocol without IV contrast. COMPARISON:  Current abdominal radiographs suggesting a bowel obstruction. FINDINGS: Lung bases: Heart is mildly enlarged. Mild lung base subsegmental atelectasis. Minimal right pleural effusion. Liver, spleen, gallbladder, pancreas, adrenal glands: Unremarkable. Kidneys, ureters, bladder:  Unremarkable. Uterus and adnexa:  Unremarkable. Lymph nodes:  No adenopathy. Ascites: Small amount pelvic free fluid. Vascular: Mild atherosclerotic calcifications along a normal caliber infrarenal abdominal aorta. Gastrointestinal: Stomach is distended as is  the proximal to mid small bowel with the distal ileum decompressed. There is a transition point in the right mid abdomen. Colon is normal in caliber. There is no bowel wall thickening or adjacent inflammatory change. A normal appendix is visualized. No free air. Musculoskeletal: There are degenerative changes throughout the visualized spine. No osteoblastic or osteolytic lesions. IMPRESSION: 1. Moderate to high-grade small bowel obstruction with the transition point noted in the right mid abdomen, likely due to adhesions. 2. No other acute abnormalities. Electronically Signed   By: Lajean Manes M.D.   On: 11/03/2015 16:52   Dg Abd Acute W/chest  Result Date: 11/03/2015 CLINICAL DATA:  Nausea, vomiting, and diarrhea since Sunday. Abdominal pain and distention. EXAM: DG ABDOMEN ACUTE W/ 1V CHEST COMPARISON:  10/02/2014 chest radiograph FINDINGS: Mild enlargement of the cardiopericardial silhouette. Stable small notch like left apical density, no change from 07/20/2013,  likely from scarring. Subsegmental atelectasis at the right lung base. Low lung volumes are present, causing crowding of the pulmonary vasculature. Dilated loops of small bowel up to 6 cm in diameter, with air- fluid levels at differing vertical heights in some loops, suggesting small bowel obstruction. Paucity of gas in the right lower quadrant, questionable mass effect or mass lesion. There is some formed stool in the descending colon. The rack a lumbar spondylosis. No compelling findings of free intraperitoneal gas. IMPRESSION: 1. Dilated loops of small bowel with air-fluid levels suggesting small bowel obstruction. 2. Paucity of right lower quadrant bowel, query mass lesion in this region. 3. CT of the abdomen and pelvis is suggested for further workup. 4. Subsegmental atelectasis at the right lung base with low lung volumes. 5. Mild enlargement of the cardiopericardial silhouette. Electronically Signed   By: Van Clines M.D.   On: 11/03/2015 15:02    ROS:  Review of systems not obtained due to patient factors.  Blood pressure (!) 105/57, pulse 98, temperature 97.8 F (36.6 C), temperature source Oral, resp. rate 20, height 5' (1.524 m), weight 99.6 kg (219 lb 9.3 oz), SpO2 97 %. Physical Exam: Pleasant black female in no acute distress. Head is normocephalic, atraumatic. Neck is supple without lymphadenopathy or carotid bruits. Lungs clear to auscultation with equal breath sounds bilaterally. Heart examination reveals a regular rate and rhythm without S3, S4, murmurs. Abdomen is distended but soft. Minimal bowel sounds appreciated. Could not appreciate hepatosplenomegaly or hernias. No rigidity is noted.  Assessment/Plan: Impression: Small bowel obstruction most likely secondary to adhesive disease. Patient has renal failure as well as electrolyte imbalance. Plan: Would continue NG tube decompression for now. No need for acute surgical intervention. Will follow closely with  you.  Coley Kulikowski A 11/04/2015, 8:40 AM

## 2015-11-04 NOTE — Progress Notes (Signed)
Subjective: Patient was admitted yesterday due to bowel obstruction. Patient has NG tube on section. Surgical consult was initiated in ER.   Objective: Vital signs in last 24 hours: Temp:  [97.8 F (36.6 C)-99 F (37.2 C)] 97.8 F (36.6 C) (08/09 0446) Pulse Rate:  [98-115] 98 (08/09 0446) Resp:  [15-23] 20 (08/09 0446) BP: (105-159)/(57-92) 105/57 (08/09 0446) SpO2:  [95 %-97 %] 97 % (08/09 0446) Weight:  [96.2 kg (212 lb)-99.6 kg (219 lb 9.3 oz)] 99.6 kg (219 lb 9.3 oz) (08/08 2156) Weight change:  Last BM Date: 11/03/15  Intake/Output from previous day: 08/08 0701 - 08/09 0700 In: -  Out: 2600 [Urine:2600]  PHYSICAL EXAM General appearance: alert and no distress Resp: clear to auscultation bilaterally Cardio: S1, S2 normal GI: abdomen distended, bowel sound is hypoactive Extremities: extremities normal, atraumatic, no cyanosis or edema  Lab Results:  Results for orders placed or performed during the hospital encounter of 11/03/15 (from the past 48 hour(s))  CBC with Differential     Status: Abnormal   Collection Time: 11/03/15  2:38 PM  Result Value Ref Range   WBC 7.0 4.0 - 10.5 K/uL   RBC 3.89 3.87 - 5.11 MIL/uL   Hemoglobin 10.8 (L) 12.0 - 15.0 g/dL   HCT 32.2 (L) 36.0 - 46.0 %   MCV 82.8 78.0 - 100.0 fL   MCH 27.8 26.0 - 34.0 pg   MCHC 33.5 30.0 - 36.0 g/dL   RDW 14.6 11.5 - 15.5 %   Platelets 230 150 - 400 K/uL   Neutrophils Relative % 59 %   Lymphocytes Relative 23 %   Monocytes Relative 18 %   Eosinophils Relative 0 %   Basophils Relative 0 %   Neutro Abs 4.1 1.7 - 7.7 K/uL   Lymphs Abs 1.6 0.7 - 4.0 K/uL   Monocytes Absolute 1.3 (H) 0.1 - 1.0 K/uL   Eosinophils Absolute 0.0 0.0 - 0.7 K/uL   Basophils Absolute 0.0 0.0 - 0.1 K/uL   WBC Morphology INCREASED BANDS (>20% BANDS)     Comment: ATYPICAL LYMPHOCYTES   Smear Review LARGE PLATELETS PRESENT   Comprehensive metabolic panel     Status: Abnormal   Collection Time: 11/03/15  2:38 PM  Result Value  Ref Range   Sodium 120 (L) 135 - 145 mmol/L   Potassium 4.3 3.5 - 5.1 mmol/L   Chloride 86 (L) 101 - 111 mmol/L   CO2 14 (L) 22 - 32 mmol/L   Glucose, Bld 136 (H) 65 - 99 mg/dL   BUN 43 (H) 6 - 20 mg/dL   Creatinine, Ser 3.27 (H) 0.44 - 1.00 mg/dL   Calcium 7.8 (L) 8.9 - 10.3 mg/dL   Total Protein 7.2 6.5 - 8.1 g/dL   Albumin 3.5 3.5 - 5.0 g/dL   AST 24 15 - 41 U/L   ALT 18 14 - 54 U/L   Alkaline Phosphatase 42 38 - 126 U/L   Total Bilirubin 0.3 0.3 - 1.2 mg/dL   GFR calc non Af Amer 13 (L) >60 mL/min   GFR calc Af Amer 15 (L) >60 mL/min    Comment: (NOTE) The eGFR has been calculated using the CKD EPI equation. This calculation has not been validated in all clinical situations. eGFR's persistently <60 mL/min signify possible Chronic Kidney Disease.    Anion gap 20 (H) 5 - 15  Lipase, blood     Status: Abnormal   Collection Time: 11/03/15  2:38 PM  Result Value Ref Range     Lipase <10 (L) 11 - 51 U/L  Troponin I     Status: None   Collection Time: 11/03/15  2:38 PM  Result Value Ref Range   Troponin I <0.03 <0.03 ng/mL  Urinalysis, Routine w reflex microscopic (not at [facility])     Status: None   Collection Time: 11/03/15  3:45 PM  Result Value Ref Range   Color, Urine YELLOW YELLOW   APPearance CLEAR CLEAR   Specific Gravity, Urine 1.020 1.005 - 1.030   pH 5.5 5.0 - 8.0   Glucose, UA NEGATIVE NEGATIVE mg/dL   Hgb urine dipstick NEGATIVE NEGATIVE   Bilirubin Urine NEGATIVE NEGATIVE   Ketones, ur NEGATIVE NEGATIVE mg/dL   Protein, ur NEGATIVE NEGATIVE mg/dL   Nitrite NEGATIVE NEGATIVE   Leukocytes, UA NEGATIVE NEGATIVE    Comment: MICROSCOPIC NOT DONE ON URINES WITH NEGATIVE PROTEIN, BLOOD, LEUKOCYTES, NITRITE, OR GLUCOSE <1000 mg/dL.  Troponin I     Status: None   Collection Time: 11/03/15  7:15 PM  Result Value Ref Range   Troponin I <0.03 <0.03 ng/mL  Magnesium     Status: Abnormal   Collection Time: 11/03/15  7:15 PM  Result Value Ref Range   Magnesium 1.1 (L) 1.7  - 2.4 mg/dL  Phosphorus     Status: Abnormal   Collection Time: 11/03/15  7:15 PM  Result Value Ref Range   Phosphorus 5.5 (H) 2.5 - 4.6 mg/dL  Basic metabolic panel     Status: Abnormal   Collection Time: 11/03/15  7:15 PM  Result Value Ref Range   Sodium 124 (L) 135 - 145 mmol/L   Potassium 4.4 3.5 - 5.1 mmol/L   Chloride 91 (L) 101 - 111 mmol/L   CO2 18 (L) 22 - 32 mmol/L   Glucose, Bld 115 (H) 65 - 99 mg/dL   BUN 41 (H) 6 - 20 mg/dL   Creatinine, Ser 2.96 (H) 0.44 - 1.00 mg/dL   Calcium 7.5 (L) 8.9 - 10.3 mg/dL   GFR calc non Af Amer 15 (L) >60 mL/min   GFR calc Af Amer 17 (L) >60 mL/min    Comment: (NOTE) The eGFR has been calculated using the CKD EPI equation. This calculation has not been validated in all clinical situations. eGFR's persistently <60 mL/min signify possible Chronic Kidney Disease.    Anion gap 15 5 - 15  MRSA PCR Screening     Status: None   Collection Time: 11/03/15 10:00 PM  Result Value Ref Range   MRSA by PCR NEGATIVE NEGATIVE    Comment:        The GeneXpert MRSA Assay (FDA approved for NASAL specimens only), is one component of a comprehensive MRSA colonization surveillance program. It is not intended to diagnose MRSA infection nor to guide or monitor treatment for MRSA infections.   Sodium, urine, random     Status: None   Collection Time: 11/03/15 11:30 PM  Result Value Ref Range   Sodium, Ur 56 mmol/L  Creatinine, urine, random     Status: None   Collection Time: 11/03/15 11:30 PM  Result Value Ref Range   Creatinine, Urine 50.19 mg/dL  Protein, urine, random     Status: None   Collection Time: 11/03/15 11:30 PM  Result Value Ref Range   Total Protein, Urine 21 mg/dL    Comment: NO NORMAL RANGE ESTABLISHED FOR THIS TEST  Comprehensive metabolic panel     Status: Abnormal   Collection Time: 11/04/15  2:59 AM  Result Value  Ref Range   Sodium 126 (L) 135 - 145 mmol/L   Potassium 4.2 3.5 - 5.1 mmol/L   Chloride 94 (L) 101 - 111  mmol/L   CO2 21 (L) 22 - 32 mmol/L   Glucose, Bld 119 (H) 65 - 99 mg/dL   BUN 39 (H) 6 - 20 mg/dL   Creatinine, Ser 2.62 (H) 0.44 - 1.00 mg/dL   Calcium 7.6 (L) 8.9 - 10.3 mg/dL   Total Protein 6.4 (L) 6.5 - 8.1 g/dL   Albumin 3.1 (L) 3.5 - 5.0 g/dL   AST 18 15 - 41 U/L   ALT 15 14 - 54 U/L   Alkaline Phosphatase 39 38 - 126 U/L   Total Bilirubin 0.2 (L) 0.3 - 1.2 mg/dL   GFR calc non Af Amer 17 (L) >60 mL/min   GFR calc Af Amer 20 (L) >60 mL/min    Comment: (NOTE) The eGFR has been calculated using the CKD EPI equation. This calculation has not been validated in all clinical situations. eGFR's persistently <60 mL/min signify possible Chronic Kidney Disease.    Anion gap 11 5 - 15  CBC     Status: Abnormal   Collection Time: 11/04/15  2:59 AM  Result Value Ref Range   WBC 5.5 4.0 - 10.5 K/uL   RBC 3.69 (L) 3.87 - 5.11 MIL/uL   Hemoglobin 10.3 (L) 12.0 - 15.0 g/dL   HCT 30.5 (L) 36.0 - 46.0 %   MCV 82.7 78.0 - 100.0 fL   MCH 27.9 26.0 - 34.0 pg   MCHC 33.8 30.0 - 36.0 g/dL   RDW 14.1 11.5 - 15.5 %   Platelets 226 150 - 400 K/uL    ABGS No results for input(s): PHART, PO2ART, TCO2, HCO3 in the last 72 hours.  Invalid input(s): PCO2 CULTURES Recent Results (from the past 240 hour(s))  MRSA PCR Screening     Status: None   Collection Time: 11/03/15 10:00 PM  Result Value Ref Range Status   MRSA by PCR NEGATIVE NEGATIVE Final    Comment:        The GeneXpert MRSA Assay (FDA approved for NASAL specimens only), is one component of a comprehensive MRSA colonization surveillance program. It is not intended to diagnose MRSA infection nor to guide or monitor treatment for MRSA infections.    Studies/Results: Ct Abdomen Pelvis Wo Contrast  Result Date: 11/03/2015 CLINICAL DATA:  Nausea vomiting for 3 days. Increasing weakness with shortness of breath today. EXAM: CT ABDOMEN AND PELVIS WITHOUT CONTRAST TECHNIQUE: Multidetector CT imaging of the abdomen and pelvis was  performed following the standard protocol without IV contrast. COMPARISON:  Current abdominal radiographs suggesting a bowel obstruction. FINDINGS: Lung bases: Heart is mildly enlarged. Mild lung base subsegmental atelectasis. Minimal right pleural effusion. Liver, spleen, gallbladder, pancreas, adrenal glands: Unremarkable. Kidneys, ureters, bladder:  Unremarkable. Uterus and adnexa:  Unremarkable. Lymph nodes:  No adenopathy. Ascites: Small amount pelvic free fluid. Vascular: Mild atherosclerotic calcifications along a normal caliber infrarenal abdominal aorta. Gastrointestinal: Stomach is distended as is the proximal to mid small bowel with the distal ileum decompressed. There is a transition point in the right mid abdomen. Colon is normal in caliber. There is no bowel wall thickening or adjacent inflammatory change. A normal appendix is visualized. No free air. Musculoskeletal: There are degenerative changes throughout the visualized spine. No osteoblastic or osteolytic lesions. IMPRESSION: 1. Moderate to high-grade small bowel obstruction with the transition point noted in the right mid abdomen, likely due   to adhesions. 2. No other acute abnormalities. Electronically Signed   By: Lajean Manes M.D.   On: 11/03/2015 16:52   Dg Abd Acute W/chest  Result Date: 11/03/2015 CLINICAL DATA:  Nausea, vomiting, and diarrhea since Sunday. Abdominal pain and distention. EXAM: DG ABDOMEN ACUTE W/ 1V CHEST COMPARISON:  10/02/2014 chest radiograph FINDINGS: Mild enlargement of the cardiopericardial silhouette. Stable small notch like left apical density, no change from 07/20/2013, likely from scarring. Subsegmental atelectasis at the right lung base. Low lung volumes are present, causing crowding of the pulmonary vasculature. Dilated loops of small bowel up to 6 cm in diameter, with air- fluid levels at differing vertical heights in some loops, suggesting small bowel obstruction. Paucity of gas in the right lower quadrant,  questionable mass effect or mass lesion. There is some formed stool in the descending colon. The rack a lumbar spondylosis. No compelling findings of free intraperitoneal gas. IMPRESSION: 1. Dilated loops of small bowel with air-fluid levels suggesting small bowel obstruction. 2. Paucity of right lower quadrant bowel, query mass lesion in this region. 3. CT of the abdomen and pelvis is suggested for further workup. 4. Subsegmental atelectasis at the right lung base with low lung volumes. 5. Mild enlargement of the cardiopericardial silhouette. Electronically Signed   By: Van Clines M.D.   On: 11/03/2015 15:02    Medications: I have reviewed the patient's current medications.  Assesment:  Active Problems:   SBO (small bowel obstruction) (HCC)    Acute on chronic renal failure     Hyponatremia     Plan:  Medications reviewed Will increase IVF fluid to 125 ml/hr Will monitor CBC and BMP Surgical consult.    LOS: 1 day   Maria Pace 11/04/2015, 8:01 AM

## 2015-11-04 NOTE — Care Management Important Message (Signed)
Important Message  Patient Details  Name: Maria Pace MRN: [id number] Date of Birth: 10/18/1940   Medicare Important Message Given:  Yes    Adaiah Morken, Chrystine OilerSharley Diane, RN 11/04/2015, 2:20 PM

## 2015-11-04 NOTE — Clinical Social Work Note (Signed)
Clinical Social Work Assessment  Patient Details  Name: Maria Pace MRN: [id number] Date of Birth: 12-03-1940  Date of referral:  11/04/15               Reason for consult:  Discharge Planning                Permission sought to share information with:  Facility Art therapist granted to share information::  Yes, Verbal Permission Granted  Name::        Agency::  [facility]  Relationship::  facility  Contact Information:     Housing/Transportation Living arrangements for the past 2 months:  [place] of Information:  Patient, Facility Patient Interpreter Needed:  None Criminal Activity/Legal Involvement Pertinent to Current Situation/Hospitalization:  No - Comment as needed Significant Relationships:  Siblings Lives with:  Facility Resident Do you feel safe going back to the place where you live?  Yes Need for family participation in patient care:  No (Coment)  Care giving concerns:  None reported. Pt is long term resident at [facility].    Social Worker assessment / plan:  CSW met with pt at bedside. Pt alert and oriented and has been a resident at [facility] for about two years. Her sister, Maria Pace is involved. Per Ivin Booty, supervisor-in-charge at facility, pt requires assist with bathing and dressing at baseline. She ambulates with a walker. No home health prior to admission and okay to return. Ivin Booty reports she plans to visit pt today in hospital. Admitted with small bowel obstruction- pt has NG tube.   Employment status:  Retired Forensic scientist:  Medicare PT Recommendations:  Not assessed at this time [place] / Referral to community resources:  Other (Comment Required) (Return to [facility])  Patient/Family's Response to care:  Pt agreeable to return to [facility] when medically stable.   Patient/Family's Understanding of and Emotional Response to Diagnosis, Current Treatment, and Prognosis:  This was not discussed with  pt due to NG tube and pt did not appear to feel very well.   Emotional Assessment Appearance:  Appears stated age Attitude/Demeanor/Rapport:  Other (Cooperative) Affect (typically observed):  Appropriate Orientation:  Oriented to Self, Oriented to Place, Oriented to Situation Alcohol / Substance use:  Not Applicable Psych involvement (Current and /or in the community):  No (Comment)  Discharge Needs  Concerns to be addressed:  Discharge Planning Concerns Readmission within the last 30 days:  No Current discharge risk:  None Barriers to Discharge:  No Barriers Identified   Salome Arnt, [place] 11/04/2015, 9:50 AM [phone]

## 2015-11-05 ENCOUNTER — Encounter (HOSPITAL_COMMUNITY): Payer: Self-pay

## 2015-11-05 LAB — GLUCOSE, CAPILLARY
Glucose-Capillary: 100 mg/dL — ABNORMAL HIGH (ref 65–99)
Glucose-Capillary: 77 mg/dL (ref 65–99)
Glucose-Capillary: 85 mg/dL (ref 65–99)
Glucose-Capillary: 94 mg/dL (ref 65–99)

## 2015-11-05 LAB — BASIC METABOLIC PANEL
Anion gap: 10 (ref 5–15)
BUN: 26 mg/dL — ABNORMAL HIGH (ref 6–20)
CO2: 20 mmol/L — ABNORMAL LOW (ref 22–32)
Calcium: 7.9 mg/dL — ABNORMAL LOW (ref 8.9–10.3)
Chloride: 104 mmol/L (ref 101–111)
Creatinine, Ser: 1.58 mg/dL — ABNORMAL HIGH (ref 0.44–1.00)
GFR calc Af Amer: 36 mL/min — ABNORMAL LOW (ref >60)
GFR calc non Af Amer: 31 mL/min — ABNORMAL LOW (ref >60)
Glucose, Bld: 108 mg/dL — ABNORMAL HIGH (ref 65–99)
Potassium: 3.6 mmol/L (ref 3.5–5.1)
Sodium: 134 mmol/L — ABNORMAL LOW (ref 135–145)

## 2015-11-05 LAB — CBC
HCT: 29 % — ABNORMAL LOW (ref 36.0–46.0)
Hemoglobin: 10.1 g/dL — ABNORMAL LOW (ref 12.0–15.0)
MCH: 28.7 pg (ref 26.0–34.0)
MCHC: 34.8 g/dL (ref 30.0–36.0)
MCV: 82.4 fL (ref 78.0–100.0)
Platelets: 204 10*3/uL (ref 150–400)
RBC: 3.52 MIL/uL — ABNORMAL LOW (ref 3.87–5.11)
RDW: 14.6 % (ref 11.5–15.5)
WBC: 6.1 10*3/uL (ref 4.0–10.5)

## 2015-11-05 LAB — FANA STAINING PATTERNS: Homogeneous Pattern: 1:160 {titer} — ABNORMAL HIGH

## 2015-11-05 LAB — ANTINUCLEAR ANTIBODIES, IFA: ANA Ab, IFA: POSITIVE — AB

## 2015-11-05 MED ORDER — BISACODYL 10 MG RE SUPP
10.0000 mg | Freq: Two times a day (BID) | RECTAL | Status: DC
  Administered 2015-11-05 – 2015-11-06 (×3): 10 mg via RECTAL
  Filled 2015-11-05 (×4): qty 1

## 2015-11-05 MED ORDER — INSULIN ASPART 100 UNIT/ML ~~LOC~~ SOLN
0.0000 [IU] | Freq: Four times a day (QID) | SUBCUTANEOUS | Status: DC
  Administered 2015-11-06 – 2015-11-10 (×7): 2 [IU] via SUBCUTANEOUS

## 2015-11-05 MED ORDER — INSULIN ASPART 100 UNIT/ML ~~LOC~~ SOLN: 0.0000 [IU] | SUBCUTANEOUS | Status: DC

## 2015-11-05 NOTE — Consult Note (Signed)
   [facility] Consult   11/05/2015  Golden PopDorothy A Fellner 10/07/1940 [id number]  Patient screened for potential [facility] Care Management services. Patient is eligible for [facility] Care [facility] Services. Electronic medical record reveals patient's discharge plan is to return to Assisted Living Facility, there were no identifiable [facility] needs at this time. [facility] Care [facility] services not appropriate at this time. If patient's post hospital needs change please place a [facility] Care [facility] consult.  For questions please contact:   Alben SpittleMary E. Albertha GheeNiemczura, RN, BSN, [facility]  [facility] Hospital Liaison [phone]

## 2015-11-05 NOTE — Progress Notes (Signed)
Subjective: Patient is more alert and awake. She feels better. Her renal function and hyponatremia is improving. Discussed with Dr.Jenkins who advised to continue NG tube suction and conservative measures at this time.  Objective: Vital signs in last 24 hours: Temp:  [97.6 F (36.4 C)-98.9 F (37.2 C)] 98.9 F (37.2 C) (08/10 0537) Pulse Rate:  [85-140] 140 (08/10 0537) Resp:  [20] 20 (08/10 0537) BP: (102-137)/(60-87) 132/87 (08/10 0537) SpO2:  [95 %-98 %] 95 % (08/10 0537) Weight change:  Last BM Date: 11/03/15  Intake/Output from previous day: 08/09 0701 - 08/10 0700 In: -  Out: 3650 [Urine:2600; Emesis/NG output:1050]  PHYSICAL EXAM General appearance: alert and no distress Resp: clear to auscultation bilaterally Cardio: S1, S2 normal GI: abdomen distended, bowel sound is hypoactive Extremities: extremities normal, atraumatic, no cyanosis or edema  Lab Results:  Results for orders placed or performed during the hospital encounter of 11/03/15 (from the past 48 hour(s))  CBC with Differential     Status: Abnormal   Collection Time: 11/03/15  2:38 PM  Result Value Ref Range   WBC 7.0 4.0 - 10.5 K/uL   RBC 3.89 3.87 - 5.11 MIL/uL   Hemoglobin 10.8 (L) 12.0 - 15.0 g/dL   HCT 32.2 (L) 36.0 - 46.0 %   MCV 82.8 78.0 - 100.0 fL   MCH 27.8 26.0 - 34.0 pg   MCHC 33.5 30.0 - 36.0 g/dL   RDW 14.6 11.5 - 15.5 %   Platelets 230 150 - 400 K/uL   Neutrophils Relative % 59 %   Lymphocytes Relative 23 %   Monocytes Relative 18 %   Eosinophils Relative 0 %   Basophils Relative 0 %   Neutro Abs 4.1 1.7 - 7.7 K/uL   Lymphs Abs 1.6 0.7 - 4.0 K/uL   Monocytes Absolute 1.3 (H) 0.1 - 1.0 K/uL   Eosinophils Absolute 0.0 0.0 - 0.7 K/uL   Basophils Absolute 0.0 0.0 - 0.1 K/uL   WBC Morphology INCREASED BANDS (>20% BANDS)     Comment: ATYPICAL LYMPHOCYTES   Smear Review LARGE PLATELETS PRESENT   Comprehensive metabolic panel     Status: Abnormal   Collection Time: 11/03/15  2:38 PM   Result Value Ref Range   Sodium 120 (L) 135 - 145 mmol/L   Potassium 4.3 3.5 - 5.1 mmol/L   Chloride 86 (L) 101 - 111 mmol/L   CO2 14 (L) 22 - 32 mmol/L   Glucose, Bld 136 (H) 65 - 99 mg/dL   BUN 43 (H) 6 - 20 mg/dL   Creatinine, Ser 3.27 (H) 0.44 - 1.00 mg/dL   Calcium 7.8 (L) 8.9 - 10.3 mg/dL   Total Protein 7.2 6.5 - 8.1 g/dL   Albumin 3.5 3.5 - 5.0 g/dL   AST 24 15 - 41 U/L   ALT 18 14 - 54 U/L   Alkaline Phosphatase 42 38 - 126 U/L   Total Bilirubin 0.3 0.3 - 1.2 mg/dL   GFR calc non Af Amer 13 (L) >60 mL/min   GFR calc Af Amer 15 (L) >60 mL/min    Comment: (NOTE) The eGFR has been calculated using the CKD EPI equation. This calculation has not been validated in all clinical situations. eGFR's persistently <60 mL/min signify possible Chronic Kidney Disease.    Anion gap 20 (H) 5 - 15  Lipase, blood     Status: Abnormal   Collection Time: 11/03/15  2:38 PM  Result Value Ref Range   Lipase <10 (L) 11 -  51 U/L  Troponin I     Status: None   Collection Time: 11/03/15  2:38 PM  Result Value Ref Range   Troponin I <0.03 <0.03 ng/mL  Urinalysis, Routine w reflex microscopic (not at [facility])     Status: None   Collection Time: 11/03/15  3:45 PM  Result Value Ref Range   Color, Urine YELLOW YELLOW   APPearance CLEAR CLEAR   Specific Gravity, Urine 1.020 1.005 - 1.030   pH 5.5 5.0 - 8.0   Glucose, UA NEGATIVE NEGATIVE mg/dL   Hgb urine dipstick NEGATIVE NEGATIVE   Bilirubin Urine NEGATIVE NEGATIVE   Ketones, ur NEGATIVE NEGATIVE mg/dL   Protein, ur NEGATIVE NEGATIVE mg/dL   Nitrite NEGATIVE NEGATIVE   Leukocytes, UA NEGATIVE NEGATIVE    Comment: MICROSCOPIC NOT DONE ON URINES WITH NEGATIVE PROTEIN, BLOOD, LEUKOCYTES, NITRITE, OR GLUCOSE <1000 mg/dL.  Troponin I     Status: None   Collection Time: 11/03/15  7:15 PM  Result Value Ref Range   Troponin I <0.03 <0.03 ng/mL  Magnesium     Status: Abnormal   Collection Time: 11/03/15  7:15 PM  Result Value Ref Range    Magnesium 1.1 (L) 1.7 - 2.4 mg/dL  Phosphorus     Status: Abnormal   Collection Time: 11/03/15  7:15 PM  Result Value Ref Range   Phosphorus 5.5 (H) 2.5 - 4.6 mg/dL  Basic metabolic panel     Status: Abnormal   Collection Time: 11/03/15  7:15 PM  Result Value Ref Range   Sodium 124 (L) 135 - 145 mmol/L   Potassium 4.4 3.5 - 5.1 mmol/L   Chloride 91 (L) 101 - 111 mmol/L   CO2 18 (L) 22 - 32 mmol/L   Glucose, Bld 115 (H) 65 - 99 mg/dL   BUN 41 (H) 6 - 20 mg/dL   Creatinine, Ser 2.96 (H) 0.44 - 1.00 mg/dL   Calcium 7.5 (L) 8.9 - 10.3 mg/dL   GFR calc non Af Amer 15 (L) >60 mL/min   GFR calc Af Amer 17 (L) >60 mL/min    Comment: (NOTE) The eGFR has been calculated using the CKD EPI equation. This calculation has not been validated in all clinical situations. eGFR's persistently <60 mL/min signify possible Chronic Kidney Disease.    Anion gap 15 5 - 15  MRSA PCR Screening     Status: None   Collection Time: 11/03/15 10:00 PM  Result Value Ref Range   MRSA by PCR NEGATIVE NEGATIVE    Comment:        The GeneXpert MRSA Assay (FDA approved for NASAL specimens only), is one component of a comprehensive MRSA colonization surveillance program. It is not intended to diagnose MRSA infection nor to guide or monitor treatment for MRSA infections.   Sodium, urine, random     Status: None   Collection Time: 11/03/15 11:30 PM  Result Value Ref Range   Sodium, Ur 56 mmol/L  Creatinine, urine, random     Status: None   Collection Time: 11/03/15 11:30 PM  Result Value Ref Range   Creatinine, Urine 50.19 mg/dL  Protein, urine, random     Status: None   Collection Time: 11/03/15 11:30 PM  Result Value Ref Range   Total Protein, Urine 21 mg/dL    Comment: NO NORMAL RANGE ESTABLISHED FOR THIS TEST  Comprehensive metabolic panel     Status: Abnormal   Collection Time: 11/04/15  2:59 AM  Result Value Ref Range   Sodium  126 (L) 135 - 145 mmol/L   Potassium 4.2 3.5 - 5.1 mmol/L    Chloride 94 (L) 101 - 111 mmol/L   CO2 21 (L) 22 - 32 mmol/L   Glucose, Bld 119 (H) 65 - 99 mg/dL   BUN 39 (H) 6 - 20 mg/dL   Creatinine, Ser 2.62 (H) 0.44 - 1.00 mg/dL   Calcium 7.6 (L) 8.9 - 10.3 mg/dL   Total Protein 6.4 (L) 6.5 - 8.1 g/dL   Albumin 3.1 (L) 3.5 - 5.0 g/dL   AST 18 15 - 41 U/L   ALT 15 14 - 54 U/L   Alkaline Phosphatase 39 38 - 126 U/L   Total Bilirubin 0.2 (L) 0.3 - 1.2 mg/dL   GFR calc non Af Amer 17 (L) >60 mL/min   GFR calc Af Amer 20 (L) >60 mL/min    Comment: (NOTE) The eGFR has been calculated using the CKD EPI equation. This calculation has not been validated in all clinical situations. eGFR's persistently <60 mL/min signify possible Chronic Kidney Disease.    Anion gap 11 5 - 15  CBC     Status: Abnormal   Collection Time: 11/04/15  2:59 AM  Result Value Ref Range   WBC 5.5 4.0 - 10.5 K/uL   RBC 3.69 (L) 3.87 - 5.11 MIL/uL   Hemoglobin 10.3 (L) 12.0 - 15.0 g/dL   HCT 30.5 (L) 36.0 - 46.0 %   MCV 82.7 78.0 - 100.0 fL   MCH 27.9 26.0 - 34.0 pg   MCHC 33.8 30.0 - 36.0 g/dL   RDW 14.1 11.5 - 15.5 %   Platelets 226 150 - 400 K/uL  Basic metabolic panel     Status: Abnormal   Collection Time: 11/04/15  8:09 AM  Result Value Ref Range   Sodium 128 (L) 135 - 145 mmol/L   Potassium 4.0 3.5 - 5.1 mmol/L   Chloride 97 (L) 101 - 111 mmol/L   CO2 21 (L) 22 - 32 mmol/L   Glucose, Bld 103 (H) 65 - 99 mg/dL   BUN 38 (H) 6 - 20 mg/dL   Creatinine, Ser 2.46 (H) 0.44 - 1.00 mg/dL   Calcium 7.4 (L) 8.9 - 10.3 mg/dL   GFR calc non Af Amer 18 (L) >60 mL/min   GFR calc Af Amer 21 (L) >60 mL/min    Comment: (NOTE) The eGFR has been calculated using the CKD EPI equation. This calculation has not been validated in all clinical situations. eGFR's persistently <60 mL/min signify possible Chronic Kidney Disease.    Anion gap 10 5 - 15  Glucose, capillary     Status: Abnormal   Collection Time: 11/04/15  8:33 PM  Result Value Ref Range   Glucose-Capillary 109 (H)  65 - 99 mg/dL   Comment 1 Notify RN    Comment 2 Document in Chart   Glucose, capillary     Status: None   Collection Time: 11/05/15 12:41 AM  Result Value Ref Range   Glucose-Capillary 94 65 - 99 mg/dL   Comment 1 Notify RN    Comment 2 Document in Chart   Basic metabolic panel     Status: Abnormal   Collection Time: 11/05/15  6:26 AM  Result Value Ref Range   Sodium 134 (L) 135 - 145 mmol/L   Potassium 3.6 3.5 - 5.1 mmol/L   Chloride 104 101 - 111 mmol/L   CO2 20 (L) 22 - 32 mmol/L   Glucose, Bld 108 (H) 65 -  99 mg/dL   BUN 26 (H) 6 - 20 mg/dL   Creatinine, Ser 1.58 (H) 0.44 - 1.00 mg/dL   Calcium 7.9 (L) 8.9 - 10.3 mg/dL   GFR calc non Af Amer 31 (L) >60 mL/min   GFR calc Af Amer 36 (L) >60 mL/min    Comment: (NOTE) The eGFR has been calculated using the CKD EPI equation. This calculation has not been validated in all clinical situations. eGFR's persistently <60 mL/min signify possible Chronic Kidney Disease.    Anion gap 10 5 - 15  CBC     Status: Abnormal   Collection Time: 11/05/15  6:26 AM  Result Value Ref Range   WBC 6.1 4.0 - 10.5 K/uL   RBC 3.52 (L) 3.87 - 5.11 MIL/uL   Hemoglobin 10.1 (L) 12.0 - 15.0 g/dL   HCT 29.0 (L) 36.0 - 46.0 %   MCV 82.4 78.0 - 100.0 fL   MCH 28.7 26.0 - 34.0 pg   MCHC 34.8 30.0 - 36.0 g/dL   RDW 14.6 11.5 - 15.5 %   Platelets 204 150 - 400 K/uL    ABGS No results for input(s): PHART, PO2ART, TCO2, HCO3 in the last 72 hours.  Invalid input(s): PCO2 CULTURES Recent Results (from the past 240 hour(s))  MRSA PCR Screening     Status: None   Collection Time: 11/03/15 10:00 PM  Result Value Ref Range Status   MRSA by PCR NEGATIVE NEGATIVE Final    Comment:        The GeneXpert MRSA Assay (FDA approved for NASAL specimens only), is one component of a comprehensive MRSA colonization surveillance program. It is not intended to diagnose MRSA infection nor to guide or monitor treatment for MRSA infections.     Studies/Results: Ct Abdomen Pelvis Wo Contrast  Result Date: 11/03/2015 CLINICAL DATA:  Nausea vomiting for 3 days. Increasing weakness with shortness of breath today. EXAM: CT ABDOMEN AND PELVIS WITHOUT CONTRAST TECHNIQUE: Multidetector CT imaging of the abdomen and pelvis was performed following the standard protocol without IV contrast. COMPARISON:  Current abdominal radiographs suggesting a bowel obstruction. FINDINGS: Lung bases: Heart is mildly enlarged. Mild lung base subsegmental atelectasis. Minimal right pleural effusion. Liver, spleen, gallbladder, pancreas, adrenal glands: Unremarkable. Kidneys, ureters, bladder:  Unremarkable. Uterus and adnexa:  Unremarkable. Lymph nodes:  No adenopathy. Ascites: Small amount pelvic free fluid. Vascular: Mild atherosclerotic calcifications along a normal caliber infrarenal abdominal aorta. Gastrointestinal: Stomach is distended as is the proximal to mid small bowel with the distal ileum decompressed. There is a transition point in the right mid abdomen. Colon is normal in caliber. There is no bowel wall thickening or adjacent inflammatory change. A normal appendix is visualized. No free air. Musculoskeletal: There are degenerative changes throughout the visualized spine. No osteoblastic or osteolytic lesions. IMPRESSION: 1. Moderate to high-grade small bowel obstruction with the transition point noted in the right mid abdomen, likely due to adhesions. 2. No other acute abnormalities. Electronically Signed   By: Lajean Manes M.D.   On: 11/03/2015 16:52   US Renal  Result Date: 11/04/2015 CLINICAL DATA:  Renal failure. EXAM: RENAL / URINARY TRACT ULTRASOUND COMPLETE COMPARISON:  CT scan of November 03, 2015. FINDINGS: Right Kidney: Length: 12.7 cm. Slightly increased echogenicity of renal parenchyma is noted. No mass or hydronephrosis visualized. Left Kidney: Length: 12 cm. Slightly increased echogenicity of renal parenchyma is noted. No mass or hydronephrosis  visualized. Bladder: Nondistended secondary to Foley catheter. IMPRESSION: Slightly increased echogenicity of renal parenchyma  is noted bilaterally consistent with medical renal disease. No hydronephrosis or renal obstruction is noted. Electronically Signed   By: Marijo Conception, M.D.   On: 11/04/2015 08:46   Dg Abd Acute W/chest  Result Date: 11/03/2015 CLINICAL DATA:  Nausea, vomiting, and diarrhea since Sunday. Abdominal pain and distention. EXAM: DG ABDOMEN ACUTE W/ 1V CHEST COMPARISON:  10/02/2014 chest radiograph FINDINGS: Mild enlargement of the cardiopericardial silhouette. Stable small notch like left apical density, no change from 07/20/2013, likely from scarring. Subsegmental atelectasis at the right lung base. Low lung volumes are present, causing crowding of the pulmonary vasculature. Dilated loops of small bowel up to 6 cm in diameter, with air- fluid levels at differing vertical heights in some loops, suggesting small bowel obstruction. Paucity of gas in the right lower quadrant, questionable mass effect or mass lesion. There is some formed stool in the descending colon. The rack a lumbar spondylosis. No compelling findings of free intraperitoneal gas. IMPRESSION: 1. Dilated loops of small bowel with air-fluid levels suggesting small bowel obstruction. 2. Paucity of right lower quadrant bowel, query mass lesion in this region. 3. CT of the abdomen and pelvis is suggested for further workup. 4. Subsegmental atelectasis at the right lung base with low lung volumes. 5. Mild enlargement of the cardiopericardial silhouette. Electronically Signed   By: Van Clines M.D.   On: 11/03/2015 15:02    Medications: I have reviewed the patient's current medications.  Assesment:  Active Problems:   SBO (small bowel obstruction) (HCC)    Acute on chronic renal failure     Hyponatremia       Schizophrenia     Diabetes Mellitus type II  Plan:  Medications reviewed Continue IV fluid Will start  accucheck with sliding scale coverage Will monitor CBC and BMP Surgical consult appreciated.    LOS: 2 days   Zyona Pettaway 11/05/2015, 7:56 AM

## 2015-11-05 NOTE — Progress Notes (Signed)
Subjective: Patient denies any abdominal pain. A little bit more alert today.  Objective: Vital signs in last 24 hours: Temp:  [97.6 F (36.4 C)-98.9 F (37.2 C)] 98.9 F (37.2 C) (08/10 0537) Pulse Rate:  [85-140] 140 (08/10 0537) Resp:  [20] 20 (08/10 0537) BP: (102-137)/(60-87) 132/87 (08/10 0537) SpO2:  [95 %-98 %] 95 % (08/10 0537) Last BM Date: 11/03/15  Intake/Output from previous day: 08/09 0701 - 08/10 0700 In: -  Out: 3650 [Urine:2600; Emesis/NG output:1050] Intake/Output this shift: No intake/output data recorded.  General appearance: cooperative and no distress GI: Soft but still distended. Minimal bowel sounds appreciated. No rigidity noted. No tenderness noted.  Lab Results:   Recent Labs  11/04/15 0259 11/05/15 0626  WBC 5.5 6.1  HGB 10.3* 10.1*  HCT 30.5* 29.0*  PLT 226 204   BMET  Recent Labs  11/04/15 0809 11/05/15 0626  NA 128* 134*  K 4.0 3.6  CL 97* 104  CO2 21* 20*  GLUCOSE 103* 108*  BUN 38* 26*  CREATININE 2.46* 1.58*  CALCIUM 7.4* 7.9*   PT/INR No results for input(s): LABPROT, INR in the last 72 hours.  Studies/Results: Ct Abdomen Pelvis Wo Contrast  Result Date: 11/03/2015 CLINICAL DATA:  Nausea vomiting for 3 days. Increasing weakness with shortness of breath today. EXAM: CT ABDOMEN AND PELVIS WITHOUT CONTRAST TECHNIQUE: Multidetector CT imaging of the abdomen and pelvis was performed following the standard protocol without IV contrast. COMPARISON:  Current abdominal radiographs suggesting a bowel obstruction. FINDINGS: Lung bases: Heart is mildly enlarged. Mild lung base subsegmental atelectasis. Minimal right pleural effusion. Liver, spleen, gallbladder, pancreas, adrenal glands: Unremarkable. Kidneys, ureters, bladder:  Unremarkable. Uterus and adnexa:  Unremarkable. Lymph nodes:  No adenopathy. Ascites: Small amount pelvic free fluid. Vascular: Mild atherosclerotic calcifications along a normal caliber infrarenal abdominal  aorta. Gastrointestinal: Stomach is distended as is the proximal to mid small bowel with the distal ileum decompressed. There is a transition point in the right mid abdomen. Colon is normal in caliber. There is no bowel wall thickening or adjacent inflammatory change. A normal appendix is visualized. No free air. Musculoskeletal: There are degenerative changes throughout the visualized spine. No osteoblastic or osteolytic lesions. IMPRESSION: 1. Moderate to high-grade small bowel obstruction with the transition point noted in the right mid abdomen, likely due to adhesions. 2. No other acute abnormalities. Electronically Signed   By: Amie Portlandavid  Ormond M.D.   On: 11/03/2015 16:52   [place] Renal  Result Date: 11/04/2015 CLINICAL DATA:  Renal failure. EXAM: RENAL / URINARY TRACT ULTRASOUND COMPLETE COMPARISON:  CT scan of November 03, 2015. FINDINGS: Right Kidney: Length: 12.7 cm. Slightly increased echogenicity of renal parenchyma is noted. No mass or hydronephrosis visualized. Left Kidney: Length: 12 cm. Slightly increased echogenicity of renal parenchyma is noted. No mass or hydronephrosis visualized. Bladder: Nondistended secondary to Foley catheter. IMPRESSION: Slightly increased echogenicity of renal parenchyma is noted bilaterally consistent with medical renal disease. No hydronephrosis or renal obstruction is noted. Electronically Signed   By: Lupita RaiderJames  Green Jr, M.D.   On: 11/04/2015 08:46   Dg Abd Acute W/chest  Result Date: 11/03/2015 CLINICAL DATA:  Nausea, vomiting, and diarrhea since Sunday. Abdominal pain and distention. EXAM: DG ABDOMEN ACUTE W/ 1V CHEST COMPARISON:  10/02/2014 chest radiograph FINDINGS: Mild enlargement of the cardiopericardial silhouette. Stable small notch like left apical density, no change from 07/20/2013, likely from scarring. Subsegmental atelectasis at the right lung base. Low lung volumes are present, causing crowding of the pulmonary  vasculature. Dilated loops of small bowel up to 6 cm  in diameter, with air- fluid levels at differing vertical heights in some loops, suggesting small bowel obstruction. Paucity of gas in the right lower quadrant, questionable mass effect or mass lesion. There is some formed stool in the descending colon. The rack a lumbar spondylosis. No compelling findings of free intraperitoneal gas. IMPRESSION: 1. Dilated loops of small bowel with air-fluid levels suggesting small bowel obstruction. 2. Paucity of right lower quadrant bowel, query mass lesion in this region. 3. CT of the abdomen and pelvis is suggested for further workup. 4. Subsegmental atelectasis at the right lung base with low lung volumes. 5. Mild enlargement of the cardiopericardial silhouette. Electronically Signed   By: Gaylyn Rong M.D.   On: 11/03/2015 15:02    Anti-infectives: Anti-infectives    None      Assessment/Plan: Impression: Small bowel obstruction. Hyponatremia and renal function are normalizing. Patient did have a bowel movement, but still had 1 L of NG tube output. Lan: Would continue NG tube decompression. We'll try Dulcolax suppository. No need for acute surgical intervention today. We'll follow with you.  LOS: 2 days    Manuella Blackson A 11/05/2015

## 2015-11-06 LAB — BASIC METABOLIC PANEL
Anion gap: 8 (ref 5–15)
BUN: 16 mg/dL (ref 6–20)
CO2: 22 mmol/L (ref 22–32)
Calcium: 7.9 mg/dL — ABNORMAL LOW (ref 8.9–10.3)
Chloride: 108 mmol/L (ref 101–111)
Creatinine, Ser: 1.27 mg/dL — ABNORMAL HIGH (ref 0.44–1.00)
GFR calc Af Amer: 47 mL/min — ABNORMAL LOW (ref >60)
GFR calc non Af Amer: 41 mL/min — ABNORMAL LOW (ref >60)
Glucose, Bld: 96 mg/dL (ref 65–99)
Potassium: 3.3 mmol/L — ABNORMAL LOW (ref 3.5–5.1)
Sodium: 138 mmol/L (ref 135–145)

## 2015-11-06 LAB — GLUCOSE, CAPILLARY
Glucose-Capillary: 123 mg/dL — ABNORMAL HIGH (ref 65–99)
Glucose-Capillary: 87 mg/dL (ref 65–99)
Glucose-Capillary: 88 mg/dL (ref 65–99)
Glucose-Capillary: 88 mg/dL (ref 65–99)
Glucose-Capillary: 89 mg/dL (ref 65–99)
Glucose-Capillary: 93 mg/dL (ref 65–99)

## 2015-11-06 LAB — CBC
HCT: 28.2 % — ABNORMAL LOW (ref 36.0–46.0)
Hemoglobin: 9.5 g/dL — ABNORMAL LOW (ref 12.0–15.0)
MCH: 28.2 pg (ref 26.0–34.0)
MCHC: 33.7 g/dL (ref 30.0–36.0)
MCV: 83.7 fL (ref 78.0–100.0)
Platelets: 220 10*3/uL (ref 150–400)
RBC: 3.37 MIL/uL — ABNORMAL LOW (ref 3.87–5.11)
RDW: 14.8 % (ref 11.5–15.5)
WBC: 9.1 10*3/uL (ref 4.0–10.5)

## 2015-11-06 MED ORDER — POTASSIUM CHLORIDE 20 MEQ/15ML (10%) PO SOLN
10.0000 meq | Freq: Every day | ORAL | Status: DC
  Administered 2015-11-06 – 2015-11-07 (×2): 10 meq via ORAL
  Filled 2015-11-06 (×2): qty 30

## 2015-11-06 MED ORDER — POLYETHYLENE GLYCOL 3350 17 G PO PACK
17.0000 g | PACK | Freq: Two times a day (BID) | ORAL | Status: DC
  Administered 2015-11-06 – 2015-11-10 (×7): 17 g via ORAL
  Filled 2015-11-06 (×8): qty 1

## 2015-11-06 NOTE — Progress Notes (Signed)
  Subjective: Patient tolerated G-tube out again yesterday evening. She has had no further episodes of nausea or vomiting.  Objective: Vital signs in last 24 hours: Temp:  [98.3 F (36.8 C)-98.5 F (36.9 C)] 98.3 F (36.8 C) (08/11 0438) Pulse Rate:  [83-84] 84 (08/11 0438) Resp:  [20] 20 (08/11 0438) BP: (112-129)/(57-69) 112/57 (08/11 0438) SpO2:  [94 %-95 %] 94 % (08/11 0438) Last BM Date: 11/03/15  Intake/Output from previous day: 08/10 0701 - 08/11 0700 In: -  Out: 1750 [Urine:1750] Intake/Output this shift: No intake/output data recorded.  General appearance: alert and no distress GI: Soft but slightly distended. Hypoactive bowel sounds appreciated. No rigidity noted. No tenderness noted.  Lab Results:   Recent Labs  11/05/15 0626 11/06/15 0607  WBC 6.1 9.1  HGB 10.1* 9.5*  HCT 29.0* 28.2*  PLT 204 220   BMET  Recent Labs  11/05/15 0626 11/06/15 0607  NA 134* 138  K 3.6 3.3*  CL 104 108  CO2 20* 22  GLUCOSE 108* 96  BUN 26* 16  CREATININE 1.58* 1.27*  CALCIUM 7.9* 7.9*   PT/INR No results for input(s): LABPROT, INR in the last 72 hours.  Studies/Results: No results found.  Anti-infectives: Anti-infectives    None      Assessment/Plan: Impression: Small bowel obstruction, slowly resolving as patient had 2 bowel movements yesterday. Her renal function is also normalizing. She has hypokalemic. Will start clear liquid diet and MiraLAX. No need for acute surgical intervention at this time.  LOS: 3 days    Stevey Stapleton A 11/06/2015

## 2015-11-06 NOTE — Clinical Social Work Note (Signed)
CSW updated administrator at facility on pt. [facility] willing to accept pt when medically stable.   Derenda FennelKara Serge Main, [name] [phone]

## 2015-11-06 NOTE — Care Management Important Message (Signed)
Important Message  Patient Details  Name: Golden PopDorothy A Lehan MRN: [id number] Date of Birth: 05/14/1940   Medicare Important Message Given:  Yes    Malcolm MetroChildress, Rhaya Coale Demske, RN 11/06/2015, 9:37 AM

## 2015-11-06 NOTE — Plan of Care (Signed)
Problem: Tissue Perfusion: Goal: Risk factors for ineffective tissue perfusion will decrease Outcome: Progressing Heparin SQ  Problem: Fluid Volume: Goal: Ability to maintain a balanced intake and output will improve Outcome: Progressing Pt getting continuous iv fluids.  Problem: Nutrition: Goal: Adequate nutrition will be maintained Outcome: Progressing Pt removed NG Tube today.

## 2015-11-06 NOTE — Progress Notes (Signed)
Subjective: Patient has removed her NG tube. It was reinserted but patient continue remove again and again. She has not moved her bowel since 11/03/15. No nausea and vomiting.  Objective: Vital signs in last 24 hours: Temp:  [98.3 F (36.8 C)-98.5 F (36.9 C)] 98.3 F (36.8 C) (08/11 0438) Pulse Rate:  [83-84] 84 (08/11 0438) Resp:  [20] 20 (08/11 0438) BP: (112-129)/(57-69) 112/57 (08/11 0438) SpO2:  [94 %-95 %] 94 % (08/11 0438) Weight change:  Last BM Date: 11/03/15  Intake/Output from previous day: 08/10 0701 - 08/11 0700 In: -  Out: 1750 [Urine:1750]  PHYSICAL EXAM General appearance: alert and no distress Resp: clear to auscultation bilaterally Cardio: S1, S2 normal GI: abdomen distended, bowel sound is hypoactive Extremities: extremities normal, atraumatic, no cyanosis or edema  Lab Results:  Results for orders placed or performed during the hospital encounter of 11/03/15 (from the past 48 hour(s))  Basic metabolic panel     Status: Abnormal   Collection Time: 11/04/15  8:09 AM  Result Value Ref Range   Sodium 128 (L) 135 - 145 mmol/L   Potassium 4.0 3.5 - 5.1 mmol/L   Chloride 97 (L) 101 - 111 mmol/L   CO2 21 (L) 22 - 32 mmol/L   Glucose, Bld 103 (H) 65 - 99 mg/dL   BUN 38 (H) 6 - 20 mg/dL   Creatinine, Ser 2.46 (H) 0.44 - 1.00 mg/dL   Calcium 7.4 (L) 8.9 - 10.3 mg/dL   GFR calc non Af Amer 18 (L) >60 mL/min   GFR calc Af Amer 21 (L) >60 mL/min    Comment: (NOTE) The eGFR has been calculated using the CKD EPI equation. This calculation has not been validated in all clinical situations. eGFR's persistently <60 mL/min signify possible Chronic Kidney Disease.    Anion gap 10 5 - 15  Glucose, capillary     Status: Abnormal   Collection Time: 11/04/15  8:33 PM  Result Value Ref Range   Glucose-Capillary 109 (H) 65 - 99 mg/dL   Comment 1 Notify RN    Comment 2 Document in Chart   Glucose, capillary     Status: None   Collection Time: 11/05/15 12:41 AM  Result  Value Ref Range   Glucose-Capillary 94 65 - 99 mg/dL   Comment 1 Notify RN    Comment 2 Document in Chart   Basic metabolic panel     Status: Abnormal   Collection Time: 11/05/15  6:26 AM  Result Value Ref Range   Sodium 134 (L) 135 - 145 mmol/L   Potassium 3.6 3.5 - 5.1 mmol/L   Chloride 104 101 - 111 mmol/L   CO2 20 (L) 22 - 32 mmol/L   Glucose, Bld 108 (H) 65 - 99 mg/dL   BUN 26 (H) 6 - 20 mg/dL   Creatinine, Ser 1.58 (H) 0.44 - 1.00 mg/dL   Calcium 7.9 (L) 8.9 - 10.3 mg/dL   GFR calc non Af Amer 31 (L) >60 mL/min   GFR calc Af Amer 36 (L) >60 mL/min    Comment: (NOTE) The eGFR has been calculated using the CKD EPI equation. This calculation has not been validated in all clinical situations. eGFR's persistently <60 mL/min signify possible Chronic Kidney Disease.    Anion gap 10 5 - 15  CBC     Status: Abnormal   Collection Time: 11/05/15  6:26 AM  Result Value Ref Range   WBC 6.1 4.0 - 10.5 K/uL   RBC 3.52 (L)  3.87 - 5.11 MIL/uL   Hemoglobin 10.1 (L) 12.0 - 15.0 g/dL   HCT 29.0 (L) 36.0 - 46.0 %   MCV 82.4 78.0 - 100.0 fL   MCH 28.7 26.0 - 34.0 pg   MCHC 34.8 30.0 - 36.0 g/dL   RDW 14.6 11.5 - 15.5 %   Platelets 204 150 - 400 K/uL  Glucose, capillary     Status: Abnormal   Collection Time: 11/05/15 11:28 AM  Result Value Ref Range   Glucose-Capillary 100 (H) 65 - 99 mg/dL  Glucose, capillary     Status: None   Collection Time: 11/05/15  4:35 PM  Result Value Ref Range   Glucose-Capillary 77 65 - 99 mg/dL  Glucose, capillary     Status: None   Collection Time: 11/05/15  8:19 PM  Result Value Ref Range   Glucose-Capillary 85 65 - 99 mg/dL   Comment 1 Notify RN    Comment 2 Document in Chart   Glucose, capillary     Status: None   Collection Time: 11/06/15 12:07 AM  Result Value Ref Range   Glucose-Capillary 88 65 - 99 mg/dL   Comment 1 Notify RN    Comment 2 Document in Chart   Glucose, capillary     Status: None   Collection Time: 11/06/15  4:32 AM  Result  Value Ref Range   Glucose-Capillary 89 65 - 99 mg/dL   Comment 1 Notify RN    Comment 2 Document in Chart   Basic metabolic panel     Status: Abnormal   Collection Time: 11/06/15  6:07 AM  Result Value Ref Range   Sodium 138 135 - 145 mmol/L   Potassium 3.3 (L) 3.5 - 5.1 mmol/L   Chloride 108 101 - 111 mmol/L   CO2 22 22 - 32 mmol/L   Glucose, Bld 96 65 - 99 mg/dL   BUN 16 6 - 20 mg/dL   Creatinine, Ser 1.27 (H) 0.44 - 1.00 mg/dL   Calcium 7.9 (L) 8.9 - 10.3 mg/dL   GFR calc non Af Amer 41 (L) >60 mL/min   GFR calc Af Amer 47 (L) >60 mL/min    Comment: (NOTE) The eGFR has been calculated using the CKD EPI equation. This calculation has not been validated in all clinical situations. eGFR's persistently <60 mL/min signify possible Chronic Kidney Disease.    Anion gap 8 5 - 15  CBC     Status: Abnormal   Collection Time: 11/06/15  6:07 AM  Result Value Ref Range   WBC 9.1 4.0 - 10.5 K/uL   RBC 3.37 (L) 3.87 - 5.11 MIL/uL   Hemoglobin 9.5 (L) 12.0 - 15.0 g/dL   HCT 28.2 (L) 36.0 - 46.0 %   MCV 83.7 78.0 - 100.0 fL   MCH 28.2 26.0 - 34.0 pg   MCHC 33.7 30.0 - 36.0 g/dL   RDW 14.8 11.5 - 15.5 %   Platelets 220 150 - 400 K/uL  Glucose, capillary     Status: None   Collection Time: 11/06/15  7:44 AM  Result Value Ref Range   Glucose-Capillary 87 65 - 99 mg/dL    ABGS No results for input(s): PHART, PO2ART, TCO2, HCO3 in the last 72 hours.  Invalid input(s): PCO2 CULTURES Recent Results (from the past 240 hour(s))  MRSA PCR Screening     Status: None   Collection Time: 11/03/15 10:00 PM  Result Value Ref Range Status   MRSA by PCR NEGATIVE NEGATIVE Final  Comment:        The GeneXpert MRSA Assay (FDA approved for NASAL specimens only), is one component of a comprehensive MRSA colonization surveillance program. It is not intended to diagnose MRSA infection nor to guide or monitor treatment for MRSA infections.    Studies/Results: US Renal  Result Date:  11/04/2015 CLINICAL DATA:  Renal failure. EXAM: RENAL / URINARY TRACT ULTRASOUND COMPLETE COMPARISON:  CT scan of November 03, 2015. FINDINGS: Right Kidney: Length: 12.7 cm. Slightly increased echogenicity of renal parenchyma is noted. No mass or hydronephrosis visualized. Left Kidney: Length: 12 cm. Slightly increased echogenicity of renal parenchyma is noted. No mass or hydronephrosis visualized. Bladder: Nondistended secondary to Foley catheter. IMPRESSION: Slightly increased echogenicity of renal parenchyma is noted bilaterally consistent with medical renal disease. No hydronephrosis or renal obstruction is noted. Electronically Signed   By: Marijo Conception, M.D.   On: 11/04/2015 08:46    Medications: I have reviewed the patient's current medications.  Assesment:  Active Problems:   SBO (small bowel obstruction) (HCC)    Acute on chronic renal failure     Hyponatremia       Schizophrenia     Diabetes Mellitus type II  Plan:  Medications reviewed Continue IV fluid Will keep NPO Will supplement KCL Will monitor CBC and BMP As per surgical plan.    LOS: 3 days   Trevino Wyatt 11/06/2015, 8:07 AM

## 2015-11-07 LAB — BASIC METABOLIC PANEL
Anion gap: 5 (ref 5–15)
BUN: 10 mg/dL (ref 6–20)
CO2: 22 mmol/L (ref 22–32)
Calcium: 7.9 mg/dL — ABNORMAL LOW (ref 8.9–10.3)
Chloride: 108 mmol/L (ref 101–111)
Creatinine, Ser: 1.16 mg/dL — ABNORMAL HIGH (ref 0.44–1.00)
GFR calc Af Amer: 53 mL/min — ABNORMAL LOW (ref >60)
GFR calc non Af Amer: 46 mL/min — ABNORMAL LOW (ref >60)
Glucose, Bld: 103 mg/dL — ABNORMAL HIGH (ref 65–99)
Potassium: 3.5 mmol/L (ref 3.5–5.1)
Sodium: 135 mmol/L (ref 135–145)

## 2015-11-07 LAB — CBC
HCT: 26.9 % — ABNORMAL LOW (ref 36.0–46.0)
Hemoglobin: 9.1 g/dL — ABNORMAL LOW (ref 12.0–15.0)
MCH: 28.3 pg (ref 26.0–34.0)
MCHC: 33.8 g/dL (ref 30.0–36.0)
MCV: 83.5 fL (ref 78.0–100.0)
Platelets: 206 10*3/uL (ref 150–400)
RBC: 3.22 MIL/uL — ABNORMAL LOW (ref 3.87–5.11)
RDW: 15.1 % (ref 11.5–15.5)
WBC: 9.4 10*3/uL (ref 4.0–10.5)

## 2015-11-07 LAB — GLUCOSE, CAPILLARY
Glucose-Capillary: 106 mg/dL — ABNORMAL HIGH (ref 65–99)
Glucose-Capillary: 108 mg/dL — ABNORMAL HIGH (ref 65–99)
Glucose-Capillary: 135 mg/dL — ABNORMAL HIGH (ref 65–99)
Glucose-Capillary: 76 mg/dL (ref 65–99)
Glucose-Capillary: 88 mg/dL (ref 65–99)
Glucose-Capillary: 89 mg/dL (ref 65–99)

## 2015-11-07 MED ORDER — POTASSIUM CHLORIDE CRYS ER 20 MEQ PO TBCR: 40.0000 meq | EXTENDED_RELEASE_TABLET | Freq: Once | ORAL | Status: DC

## 2015-11-07 MED ORDER — BISACODYL 10 MG RE SUPP: 10.0000 mg | Freq: Every day | RECTAL | Status: DC | PRN

## 2015-11-07 NOTE — Progress Notes (Signed)
  Subjective: Patient appears more alert today. She is hungry. Denies any abdominal pain.  Objective: Vital signs in last 24 hours: Temp:  [98.5 F (36.9 C)-99 F (37.2 C)] 98.5 F (36.9 C) (08/12 0608) Pulse Rate:  [84-102] 84 (08/12 0608) Resp:  [20] 20 (08/12 0608) BP: (118-146)/(64-75) 128/73 (08/12 0608) SpO2:  [92 %-97 %] 95 % (08/12 0608) Last BM Date: 11/06/15  Intake/Output from previous day: 08/11 0701 - 08/12 0700 In: 1739 [P.O.:481; I.V.:1258] Out: 2600 [Urine:2600] Intake/Output this shift: Total I/O In: 360 [P.O.:360] Out: -   General appearance: cooperative and no distress GI: Soft, still distended, but this may be patient's baseline. Bowel sounds are present. No rigidity noted.  Lab Results:   Recent Labs  11/06/15 0607 11/07/15 0509  WBC 9.1 9.4  HGB 9.5* 9.1*  HCT 28.2* 26.9*  PLT 220 206   BMET  Recent Labs  11/06/15 0607 11/07/15 0509  NA 138 135  K 3.3* 3.5  CL 108 108  CO2 22 22  GLUCOSE 96 103*  BUN 16 10  CREATININE 1.27* 1.16*  CALCIUM 7.9* 7.9*   PT/INR No results for input(s): LABPROT, INR in the last 72 hours.  Studies/Results: No results found.  Anti-infectives: Anti-infectives    None      Assessment/Plan: Impression: Small bowel obstruction, resolving. She has had multiple bowel movements over the past 48 hours. Will advance to soft diet. No need for acute surgical intervention.  LOS: 4 days    Jaquanna Ballentine A 11/07/2015

## 2015-11-07 NOTE — Progress Notes (Signed)
Subjective: Patient feels better. She is started on full liquid diet abnd she is tolerating. She had bowel movement yesterday. No nausea or vomiting.  Objective: Vital signs in last 24 hours: Temp:  [98.2 F (36.8 C)-98.9 F (37.2 C)] 98.2 F (36.8 C) (08/12 1440) Pulse Rate:  [84-96] 90 (08/12 1440) Resp:  [20] 20 (08/12 1440) BP: (128-146)/(73-75) 130/74 (08/12 1440) SpO2:  [95 %-97 %] 96 % (08/12 1440) Weight change:  Last BM Date: 11/06/15  Intake/Output from previous day: 08/11 0701 - 08/12 0700 In: 1739 [P.O.:481; I.V.:1258] Out: 2600 [Urine:2600]  PHYSICAL EXAM General appearance: alert and no distress Resp: clear to auscultation bilaterally Cardio: S1, S2 normal GI: abdomen distended, bowel sound is hypoactive Extremities: extremities normal, atraumatic, no cyanosis or edema  Lab Results:  Results for orders placed or performed during the hospital encounter of 11/03/15 (from the past 48 hour(s))  Glucose, capillary     Status: None   Collection Time: 11/05/15  8:19 PM  Result Value Ref Range   Glucose-Capillary 85 65 - 99 mg/dL   Comment 1 Notify RN    Comment 2 Document in Chart   Glucose, capillary     Status: None   Collection Time: 11/06/15 12:07 AM  Result Value Ref Range   Glucose-Capillary 88 65 - 99 mg/dL   Comment 1 Notify RN    Comment 2 Document in Chart   Glucose, capillary     Status: None   Collection Time: 11/06/15  4:32 AM  Result Value Ref Range   Glucose-Capillary 89 65 - 99 mg/dL   Comment 1 Notify RN    Comment 2 Document in Chart   Basic metabolic panel     Status: Abnormal   Collection Time: 11/06/15  6:07 AM  Result Value Ref Range   Sodium 138 135 - 145 mmol/L   Potassium 3.3 (L) 3.5 - 5.1 mmol/L   Chloride 108 101 - 111 mmol/L   CO2 22 22 - 32 mmol/L   Glucose, Bld 96 65 - 99 mg/dL   BUN 16 6 - 20 mg/dL   Creatinine, Ser 1.27 (H) 0.44 - 1.00 mg/dL   Calcium 7.9 (L) 8.9 - 10.3 mg/dL   GFR calc non Af Amer 41 (L) >60 mL/min   GFR calc Af Amer 47 (L) >60 mL/min    Comment: (NOTE) The eGFR has been calculated using the CKD EPI equation. This calculation has not been validated in all clinical situations. eGFR's persistently <60 mL/min signify possible Chronic Kidney Disease.    Anion gap 8 5 - 15  CBC     Status: Abnormal   Collection Time: 11/06/15  6:07 AM  Result Value Ref Range   WBC 9.1 4.0 - 10.5 K/uL   RBC 3.37 (L) 3.87 - 5.11 MIL/uL   Hemoglobin 9.5 (L) 12.0 - 15.0 g/dL   HCT 28.2 (L) 36.0 - 46.0 %   MCV 83.7 78.0 - 100.0 fL   MCH 28.2 26.0 - 34.0 pg   MCHC 33.7 30.0 - 36.0 g/dL   RDW 14.8 11.5 - 15.5 %   Platelets 220 150 - 400 K/uL  Glucose, capillary     Status: None   Collection Time: 11/06/15  7:44 AM  Result Value Ref Range   Glucose-Capillary 87 65 - 99 mg/dL  Glucose, capillary     Status: None   Collection Time: 11/06/15 12:12 PM  Result Value Ref Range   Glucose-Capillary 93 65 - 99 mg/dL  Glucose, capillary  Status: Abnormal   Collection Time: 11/06/15  4:31 PM  Result Value Ref Range   Glucose-Capillary 123 (H) 65 - 99 mg/dL   Comment 1 Notify RN    Comment 2 Document in Chart   Glucose, capillary     Status: None   Collection Time: 11/06/15  8:39 PM  Result Value Ref Range   Glucose-Capillary 88 65 - 99 mg/dL   Comment 1 Notify RN    Comment 2 Document in Chart   Glucose, capillary     Status: Abnormal   Collection Time: 11/07/15 12:30 AM  Result Value Ref Range   Glucose-Capillary 108 (H) 65 - 99 mg/dL   Comment 1 Notify RN    Comment 2 Document in Chart   Glucose, capillary     Status: Abnormal   Collection Time: 11/07/15  4:42 AM  Result Value Ref Range   Glucose-Capillary 106 (H) 65 - 99 mg/dL   Comment 1 Notify RN    Comment 2 Document in Chart   Basic metabolic panel     Status: Abnormal   Collection Time: 11/07/15  5:09 AM  Result Value Ref Range   Sodium 135 135 - 145 mmol/L   Potassium 3.5 3.5 - 5.1 mmol/L   Chloride 108 101 - 111 mmol/L   CO2 22 22 -  32 mmol/L   Glucose, Bld 103 (H) 65 - 99 mg/dL   BUN 10 6 - 20 mg/dL   Creatinine, Ser 1.16 (H) 0.44 - 1.00 mg/dL   Calcium 7.9 (L) 8.9 - 10.3 mg/dL   GFR calc non Af Amer 46 (L) >60 mL/min   GFR calc Af Amer 53 (L) >60 mL/min    Comment: (NOTE) The eGFR has been calculated using the CKD EPI equation. This calculation has not been validated in all clinical situations. eGFR's persistently <60 mL/min signify possible Chronic Kidney Disease.    Anion gap 5 5 - 15  CBC     Status: Abnormal   Collection Time: 11/07/15  5:09 AM  Result Value Ref Range   WBC 9.4 4.0 - 10.5 K/uL   RBC 3.22 (L) 3.87 - 5.11 MIL/uL   Hemoglobin 9.1 (L) 12.0 - 15.0 g/dL   HCT 26.9 (L) 36.0 - 46.0 %   MCV 83.5 78.0 - 100.0 fL   MCH 28.3 26.0 - 34.0 pg   MCHC 33.8 30.0 - 36.0 g/dL   RDW 15.1 11.5 - 15.5 %   Platelets 206 150 - 400 K/uL  Glucose, capillary     Status: None   Collection Time: 11/07/15  7:46 AM  Result Value Ref Range   Glucose-Capillary 88 65 - 99 mg/dL  Glucose, capillary     Status: Abnormal   Collection Time: 11/07/15 11:51 AM  Result Value Ref Range   Glucose-Capillary 135 (H) 65 - 99 mg/dL   Comment 1 Notify RN   Glucose, capillary     Status: None   Collection Time: 11/07/15  5:00 PM  Result Value Ref Range   Glucose-Capillary 76 65 - 99 mg/dL    ABGS No results for input(s): PHART, PO2ART, TCO2, HCO3 in the last 72 hours.  Invalid input(s): PCO2 CULTURES Recent Results (from the past 240 hour(s))  MRSA PCR Screening     Status: None   Collection Time: 11/03/15 10:00 PM  Result Value Ref Range Status   MRSA by PCR NEGATIVE NEGATIVE Final    Comment:        The GeneXpert MRSA Assay (  FDA approved for NASAL specimens only), is one component of a comprehensive MRSA colonization surveillance program. It is not intended to diagnose MRSA infection nor to guide or monitor treatment for MRSA infections.    Studies/Results: No results found.  Medications: I have reviewed  the patient's current medications.  Assesment:  Active Problems:   SBO (small bowel obstruction) (HCC)    Acute on chronic renal failure     Hyponatremia       Schizophrenia     Diabetes Mellitus type II  Plan:  Medications reviewed Will decrease IV fluid Continue oral feeding as tolerated As per surgical plan.    LOS: 4 days   , 11/07/2015, 6:09 PM

## 2015-11-08 ENCOUNTER — Inpatient Hospital Stay (HOSPITAL_COMMUNITY): Payer: Medicare Other

## 2015-11-08 LAB — TSH: TSH: 3.554 u[IU]/mL (ref 0.350–4.500)

## 2015-11-08 LAB — GLUCOSE, CAPILLARY
Glucose-Capillary: 119 mg/dL — ABNORMAL HIGH (ref 65–99)
Glucose-Capillary: 118 mg/dL — ABNORMAL HIGH (ref 65–99)
Glucose-Capillary: 120 mg/dL — ABNORMAL HIGH (ref 65–99)
Glucose-Capillary: 145 mg/dL — ABNORMAL HIGH (ref 65–99)
Glucose-Capillary: 133 mg/dL — ABNORMAL HIGH (ref 65–99)
Glucose-Capillary: 106 mg/dL — ABNORMAL HIGH (ref 65–99)

## 2015-11-08 NOTE — Progress Notes (Signed)
Pt has had multiple runs of SVT since 0400.  Dr. Felecia ShellingFanta made aware.

## 2015-11-08 NOTE — Progress Notes (Signed)
Subjective: Patient is tolerating her  Diet and moving her bowel. No nausea or vomiting. She has recurrent episode of tachyarrhythmia, No chest pain or palpitation..  Objective: Vital signs in last 24 hours: Temp:  [98.2 F (36.8 C)-98.9 F (37.2 C)] 98.9 F (37.2 C) (08/13 2536) Pulse Rate:  [90-117] 97 (08/13 0638) Resp:  [20] 20 (08/13 0638) BP: (123-132)/(71-76) 132/76 (08/13 0638) SpO2:  [93 %-96 %] 94 % (08/13 6440) Weight change:  Last BM Date: 11/06/15  Intake/Output from previous day: 08/12 0701 - 08/13 0700 In: 5444.2 [P.O.:840; I.V.:4604.2] Out: 1150 [Urine:1150]  PHYSICAL EXAM General appearance: alert and no distress Resp: clear to auscultation bilaterally Cardio: S1, S2 normal GI: abdomen distended, bowel sound is hypoactive Extremities: extremities normal, atraumatic, no cyanosis or edema  Lab Results:  Results for orders placed or performed during the hospital encounter of 11/03/15 (from the past 48 hour(s))  Glucose, capillary     Status: None   Collection Time: 11/06/15 12:12 PM  Result Value Ref Range   Glucose-Capillary 93 65 - 99 mg/dL  Glucose, capillary     Status: Abnormal   Collection Time: 11/06/15  4:31 PM  Result Value Ref Range   Glucose-Capillary 123 (H) 65 - 99 mg/dL   Comment 1 Notify RN    Comment 2 Document in Chart   Glucose, capillary     Status: None   Collection Time: 11/06/15  8:39 PM  Result Value Ref Range   Glucose-Capillary 88 65 - 99 mg/dL   Comment 1 Notify RN    Comment 2 Document in Chart   Glucose, capillary     Status: Abnormal   Collection Time: 11/07/15 12:30 AM  Result Value Ref Range   Glucose-Capillary 108 (H) 65 - 99 mg/dL   Comment 1 Notify RN    Comment 2 Document in Chart   Glucose, capillary     Status: Abnormal   Collection Time: 11/07/15  4:42 AM  Result Value Ref Range   Glucose-Capillary 106 (H) 65 - 99 mg/dL   Comment 1 Notify RN    Comment 2 Document in Chart   Basic metabolic panel     Status:  Abnormal   Collection Time: 11/07/15  5:09 AM  Result Value Ref Range   Sodium 135 135 - 145 mmol/L   Potassium 3.5 3.5 - 5.1 mmol/L   Chloride 108 101 - 111 mmol/L   CO2 22 22 - 32 mmol/L   Glucose, Bld 103 (H) 65 - 99 mg/dL   BUN 10 6 - 20 mg/dL   Creatinine, Ser 1.16 (H) 0.44 - 1.00 mg/dL   Calcium 7.9 (L) 8.9 - 10.3 mg/dL   GFR calc non Af Amer 46 (L) >60 mL/min   GFR calc Af Amer 53 (L) >60 mL/min    Comment: (NOTE) The eGFR has been calculated using the CKD EPI equation. This calculation has not been validated in all clinical situations. eGFR's persistently <60 mL/min signify possible Chronic Kidney Disease.    Anion gap 5 5 - 15  CBC     Status: Abnormal   Collection Time: 11/07/15  5:09 AM  Result Value Ref Range   WBC 9.4 4.0 - 10.5 K/uL   RBC 3.22 (L) 3.87 - 5.11 MIL/uL   Hemoglobin 9.1 (L) 12.0 - 15.0 g/dL   HCT 26.9 (L) 36.0 - 46.0 %   MCV 83.5 78.0 - 100.0 fL   MCH 28.3 26.0 - 34.0 pg   MCHC 33.8 30.0 -  36.0 g/dL   RDW 15.1 11.5 - 15.5 %   Platelets 206 150 - 400 K/uL  Glucose, capillary     Status: None   Collection Time: 11/07/15  7:46 AM  Result Value Ref Range   Glucose-Capillary 88 65 - 99 mg/dL  Glucose, capillary     Status: Abnormal   Collection Time: 11/07/15 11:51 AM  Result Value Ref Range   Glucose-Capillary 135 (H) 65 - 99 mg/dL   Comment 1 Notify RN   Glucose, capillary     Status: None   Collection Time: 11/07/15  5:00 PM  Result Value Ref Range   Glucose-Capillary 76 65 - 99 mg/dL  Glucose, capillary     Status: None   Collection Time: 11/07/15  9:08 PM  Result Value Ref Range   Glucose-Capillary 89 65 - 99 mg/dL  Glucose, capillary     Status: Abnormal   Collection Time: 11/08/15  1:20 AM  Result Value Ref Range   Glucose-Capillary 119 (H) 65 - 99 mg/dL  Glucose, capillary     Status: Abnormal   Collection Time: 11/08/15  4:42 AM  Result Value Ref Range   Glucose-Capillary 118 (H) 65 - 99 mg/dL  Glucose, capillary     Status:  Abnormal   Collection Time: 11/08/15  7:32 AM  Result Value Ref Range   Glucose-Capillary 120 (H) 65 - 99 mg/dL   Comment 1 Notify RN     ABGS No results for input(s): PHART, PO2ART, TCO2, HCO3 in the last 72 hours.  Invalid input(s): PCO2 CULTURES Recent Results (from the past 240 hour(s))  MRSA PCR Screening     Status: None   Collection Time: 11/03/15 10:00 PM  Result Value Ref Range Status   MRSA by PCR NEGATIVE NEGATIVE Final    Comment:        The GeneXpert MRSA Assay (FDA approved for NASAL specimens only), is one component of a comprehensive MRSA colonization surveillance program. It is not intended to diagnose MRSA infection nor to guide or monitor treatment for MRSA infections.    Studies/Results: No results found.  Medications: I have reviewed the patient's current medications.  Assesment:  Active Problems:   SBO (small bowel obstruction) (HCC)    Acute on chronic renal failure     Hyponatremia       Schizophrenia     Diabetes Mellitus type II    Tachyarrhythmia   Plan:  Medications reviewed will do EKG TSH/BMP/CBC Continue oral feeding as tolerated As per surgical plan.    LOS: 5 days   Vamsi Apfel 11/08/2015, 9:33 AM

## 2015-11-08 NOTE — Progress Notes (Signed)
  Subjective: Patient is continuing to move her bowels and passed flatus. No nausea or vomiting are noted. Patient denies any abdominal pain.  Objective: Vital signs in last 24 hours: Temp:  [98.2 F (36.8 C)-98.9 F (37.2 C)] 98.9 F (37.2 C) (08/13 16100638) Pulse Rate:  [90-117] 97 (08/13 0638) Resp:  [20] 20 (08/13 96040638) BP: (123-132)/(71-76) 132/76 (08/13 54090638) SpO2:  [93 %-96 %] 94 % (08/13 0638) Last BM Date: 11/06/15  Intake/Output from previous day: 08/12 0701 - 08/13 0700 In: 5444.2 [P.O.:840; I.V.:4604.2] Out: 1150 [Urine:1150] Intake/Output this shift: No intake/output data recorded.  General appearance: alert, cooperative and no distress GI: Soft but still distended. Bowel sounds appreciated. No rigidity noted. No tenderness noted.  Lab Results:   Recent Labs  11/06/15 0607 11/07/15 0509  WBC 9.1 9.4  HGB 9.5* 9.1*  HCT 28.2* 26.9*  PLT 220 206   BMET  Recent Labs  11/06/15 0607 11/07/15 0509  NA 138 135  K 3.3* 3.5  CL 108 108  CO2 22 22  GLUCOSE 96 103*  BUN 16 10  CREATININE 1.27* 1.16*  CALCIUM 7.9* 7.9*   PT/INR No results for input(s): LABPROT, INR in the last 72 hours.  Studies/Results: No results found.  Anti-infectives: Anti-infectives    None      Assessment/Plan: Pression: Small bowel obstruction seems to be resolving, though I do not know her baseline body habitus concerning her abdomen. She apparently is also having runs of SVT. Will get abdominal series.  LOS: 5 days    Benson Porcaro A 11/08/2015

## 2015-11-09 LAB — BASIC METABOLIC PANEL
Anion gap: 10 (ref 5–15)
BUN: 14 mg/dL (ref 6–20)
CO2: 20 mmol/L — ABNORMAL LOW (ref 22–32)
Calcium: 7.9 mg/dL — ABNORMAL LOW (ref 8.9–10.3)
Chloride: 106 mmol/L (ref 101–111)
Creatinine, Ser: 1.28 mg/dL — ABNORMAL HIGH (ref 0.44–1.00)
GFR calc Af Amer: 47 mL/min — ABNORMAL LOW (ref >60)
GFR calc non Af Amer: 40 mL/min — ABNORMAL LOW (ref >60)
Glucose, Bld: 121 mg/dL — ABNORMAL HIGH (ref 65–99)
Potassium: 3.8 mmol/L (ref 3.5–5.1)
Sodium: 136 mmol/L (ref 135–145)

## 2015-11-09 LAB — CBC
HCT: 27.6 % — ABNORMAL LOW (ref 36.0–46.0)
Hemoglobin: 9.1 g/dL — ABNORMAL LOW (ref 12.0–15.0)
MCH: 27.7 pg (ref 26.0–34.0)
MCHC: 33 g/dL (ref 30.0–36.0)
MCV: 83.9 fL (ref 78.0–100.0)
Platelets: 208 10*3/uL (ref 150–400)
RBC: 3.29 MIL/uL — ABNORMAL LOW (ref 3.87–5.11)
RDW: 15.2 % (ref 11.5–15.5)
WBC: 11.9 10*3/uL — ABNORMAL HIGH (ref 4.0–10.5)

## 2015-11-09 LAB — GLUCOSE, CAPILLARY
Glucose-Capillary: 104 mg/dL — ABNORMAL HIGH (ref 65–99)
Glucose-Capillary: 106 mg/dL — ABNORMAL HIGH (ref 65–99)
Glucose-Capillary: 117 mg/dL — ABNORMAL HIGH (ref 65–99)
Glucose-Capillary: 123 mg/dL — ABNORMAL HIGH (ref 65–99)
Glucose-Capillary: 136 mg/dL — ABNORMAL HIGH (ref 65–99)
Glucose-Capillary: 139 mg/dL — ABNORMAL HIGH (ref 65–99)

## 2015-11-09 NOTE — Care Management Important Message (Signed)
Important Message  Patient Details  Name: Maria Pace MRN: [id number] Date of Birth: 09/04/1940   Medicare Important Message Given:  Yes    Malcolm MetroChildress, Keyonda Bickle Demske, RN 11/09/2015, 11:42 AM

## 2015-11-09 NOTE — Progress Notes (Signed)
  Subjective: Patient denies any abdominal pain. States she is hungry and waiting for breakfast.  Objective: Vital signs in last 24 hours: Temp:  [98.1 F (36.7 C)-98.7 F (37.1 C)] 98.7 F (37.1 C) (08/14 0310) Pulse Rate:  [77-100] 100 (08/14 0310) Resp:  [20] 20 (08/14 0310) BP: (105-124)/(66-80) 111/71 (08/14 0310) SpO2:  [94 %-97 %] 94 % (08/14 0310) Last BM Date: 11/08/15  Intake/Output from previous day: 08/13 0701 - 08/14 0700 In: 480 [P.O.:480] Out: 501 [Urine:500; Stool:1] Intake/Output this shift: No intake/output data recorded.  General appearance: alert, cooperative and no distress GI: Distended but soft. Active bowel sounds appreciated. No tenderness noted.  Lab Results:   Recent Labs  11/07/15 0509 11/09/15 0559  WBC 9.4 11.9*  HGB 9.1* 9.1*  HCT 26.9* 27.6*  PLT 206 208   BMET  Recent Labs  11/07/15 0509 11/09/15 0559  NA 135 136  K 3.5 3.8  CL 108 106  CO2 22 20*  GLUCOSE 103* 121*  BUN 10 14  CREATININE 1.16* 1.28*  CALCIUM 7.9* 7.9*   PT/INR No results for input(s): LABPROT, INR in the last 72 hours.  Studies/Results: Dg Abd 2 Views  Result Date: 11/08/2015 CLINICAL DATA:  Abdominal distension, small bowel obstruction, hypertension, diabetes mellitus EXAM: ABDOMEN - 2 VIEW COMPARISON:  11/03/2015 FINDINGS: Gaseous distention of small bowel loops and colon throughout abdomen, with small bowel predominance, most consistent with persistent small bowel obstruction. Degree of small-bowel distention is similar that seen on the previous exam. Some stool in sigmoid colon. No definite bowel wall thickening or free air. Osseous demineralization with degenerative disc disease changes thoracolumbar spine. IMPRESSION: Persistent small bowel dilatation but though some gas and stool are present in the colon, overall findings favoring persistent small bowel obstruction. Electronically Signed   By: Ulyses SouthwardMark  Boles M.D.   On: 11/08/2015 11:09     Anti-infectives: Anti-infectives    None      Assessment/Plan: Impression: Clinically, the patient does not have evidence of a small bowel obstruction. She is eating and moving her bowels. I do think she may need a colonoscopy at some point to assess the colon. No need for surgical intervention. She is clear for discharge from surgery standpoint.  LOS: 6 days    Ender Rorke A 11/09/2015

## 2015-11-09 NOTE — Evaluation (Signed)
Physical Therapy Evaluation Patient Details Name: Maria Pace Unterreiner MRN: [id number] DOB: 10/28/1940 Today's Date: 11/09/2015   History of Present Illness  75 year old female with history of bowel obstruction. Patient that her abdomen became distended yesterday, with associated abdominal pain, nausea, vomiting and abdominal pain. CT Scan of the abdomen done revealed moderate to high grade SBO.   Clinical Impression  Pt received in bed, and was agreeable to PT evaluation, and was excited about being able to get out of bed.  Pt expressed that she normally walks with her rollator at the ALF, and they assist with dressing.  During PT evaluation, she required Mod Pace for all aspects of functional mobility.  Unable to fully assess gait due to pt becoming tachycardic with HR up to 150's during transfer.  Recommend that she return to ALF with HHPT if facility will be able to accommodate her physical needs at this time.  If not, she will need SNF.     Follow Up Recommendations Home health PT;Supervision/Assistance - 24 hour (return to ALF)    Equipment Recommendations  None recommended by PT    Recommendations for Other Services       Precautions / Restrictions Precautions Precautions: Fall Precaution Comments: Due to weakness and recent immobility Restrictions Weight Bearing Restrictions: No      Mobility  Bed Mobility Overal bed mobility: Needs Assistance Bed Mobility: Supine to Sit     Supine to sit: HOB elevated;Mod assist     General bed mobility comments: Pt required increased time, and use of bed pad to assist her hips to the EOB.   Transfers Overall transfer level: Needs assistance Equipment used: Rolling walker (2 wheeled) Transfers: Sit to/from [employer];Stand Pivot Transfers Sit to Stand: Mod assist Stand pivot transfers: Mod assist       General transfer comment: Pt with crouched posture, and unable to to stand fully upright.  Further gait or mobility training haulted due to RN  reporting HR was tachycardic in the 150's  Ambulation/Gait                Stairs            Wheelchair Mobility    Modified Rankin (Stroke Patients Only)       Balance Overall balance assessment: Needs assistance Sitting-balance support: Bilateral upper extremity supported Sitting balance-Leahy Scale: Fair     Standing balance support: Bilateral upper extremity supported Standing balance-Leahy Scale: Poor                               Pertinent Vitals/Pain      Home Living                        Prior Function                 Hand Dominance        Extremity/Trunk Assessment   Upper Extremity Assessment: Generalized weakness           Lower Extremity Assessment: Generalized weakness         Communication      Cognition                            General Comments      Exercises        Assessment/Plan    PT Assessment Patient needs continued PT  services  PT Diagnosis Difficulty walking;Abnormality of gait;Generalized weakness   PT Problem List Decreased strength;Decreased mobility;Decreased activity tolerance;Decreased balance;Obesity  PT Treatment Interventions DME instruction;Gait training;Functional mobility training;Therapeutic activities;Therapeutic exercise;Balance training;Patient/family education   PT Goals (Current goals can be found in the Care Plan section) Acute Rehab PT Goals Patient Stated Goal: Pt wants to get stronger and go back to her ALF.  PT Goal Formulation: With patient Time For Goal Achievement: 11/16/15 Potential to Achieve Goals: Good    Frequency Min 3X/week   Barriers to discharge        Co-evaluation               End of Session Equipment Utilized During Treatment: Gait belt Activity Tolerance: Treatment limited secondary to medical complications (Comment) (elevated HR) Patient left: in chair;with call bell/phone within reach Nurse Communication:  Mobility status    Functional Assessment Tool Used: [employer] AM- PAC "6-clicks"  Functional Limitation: Mobility: Walking and moving around Mobility: Walking and Moving Around Current Status [phone](G8978): At least 60 percent but less than 80 percent impaired, limited or restricted Mobility: Walking and Moving Around Goal Status [phone](G8979): At least 40 percent but less than 60 percent impaired, limited or restricted    Time: [id number] PT Time Calculation (min) (ACUTE ONLY): 23 min   Charges:   PT Evaluation $PT Eval Moderate Complexity: 1 Procedure PT Treatments $Therapeutic Activity: 8-22 mins   PT G Codes:   PT G-Codes **NOT FOR INPATIENT CLASS** Functional Assessment Tool Used: [employer] AM- PAC "6-clicks"  Functional Limitation: Mobility: Walking and moving around Mobility: Walking and Moving Around Current Status [phone](G8978): At least 60 percent but less than 80 percent impaired, limited or restricted Mobility: Walking and Moving Around Goal Status [phone](G8979): At least 40 percent but less than 60 percent impaired, limited or restricted    Beth Graclynn Vanantwerp, PT, DPT X: [phone]

## 2015-11-09 NOTE — Progress Notes (Signed)
Subjective: Patient feels better and tolerating her diet. Her KUB showed persistent small bowel obstruction. Objective: Vital signs in last 24 hours: Temp:  [98.1 F (36.7 C)-98.7 F (37.1 C)] 98.7 F (37.1 C) (08/14 0310) Pulse Rate:  [77-100] 100 (08/14 0310) Resp:  [20] 20 (08/14 0310) BP: (105-124)/(66-80) 111/71 (08/14 0310) SpO2:  [94 %-97 %] 94 % (08/14 0310) Weight change:  Last BM Date: 11/08/15  Intake/Output from previous day: 08/13 0701 - 08/14 0700 In: 480 [P.O.:480] Out: 501 [Urine:500; Stool:1]  PHYSICAL EXAM General appearance: alert and no distress Resp: clear to auscultation bilaterally Cardio: S1, S2 normal GI: abdomen distended, bowel sound is hypoactive Extremities: extremities normal, atraumatic, no cyanosis or edema  Lab Results:  Results for orders placed or performed during the hospital encounter of 11/03/15 (from the past 48 hour(s))  Glucose, capillary     Status: Abnormal   Collection Time: 11/07/15 11:51 AM  Result Value Ref Range   Glucose-Capillary 135 (H) 65 - 99 mg/dL   Comment 1 Notify RN   Glucose, capillary     Status: None   Collection Time: 11/07/15  5:00 PM  Result Value Ref Range   Glucose-Capillary 76 65 - 99 mg/dL  Glucose, capillary     Status: None   Collection Time: 11/07/15  9:08 PM  Result Value Ref Range   Glucose-Capillary 89 65 - 99 mg/dL  Glucose, capillary     Status: Abnormal   Collection Time: 11/08/15  1:20 AM  Result Value Ref Range   Glucose-Capillary 119 (H) 65 - 99 mg/dL  Glucose, capillary     Status: Abnormal   Collection Time: 11/08/15  4:42 AM  Result Value Ref Range   Glucose-Capillary 118 (H) 65 - 99 mg/dL  Glucose, capillary     Status: Abnormal   Collection Time: 11/08/15  7:32 AM  Result Value Ref Range   Glucose-Capillary 120 (H) 65 - 99 mg/dL   Comment 1 Notify RN   TSH     Status: None   Collection Time: 11/08/15 11:06 AM  Result Value Ref Range   TSH 3.554 0.350 - 4.500 uIU/mL  Glucose,  capillary     Status: Abnormal   Collection Time: 11/08/15 11:50 AM  Result Value Ref Range   Glucose-Capillary 145 (H) 65 - 99 mg/dL   Comment 1 Notify RN   Glucose, capillary     Status: Abnormal   Collection Time: 11/08/15  4:27 PM  Result Value Ref Range   Glucose-Capillary 133 (H) 65 - 99 mg/dL   Comment 1 Notify RN    Comment 2 Document in Chart   Glucose, capillary     Status: Abnormal   Collection Time: 11/08/15  7:56 PM  Result Value Ref Range   Glucose-Capillary 106 (H) 65 - 99 mg/dL   Comment 1 Notify RN    Comment 2 Document in Chart   Glucose, capillary     Status: Abnormal   Collection Time: 11/09/15 12:13 AM  Result Value Ref Range   Glucose-Capillary 117 (H) 65 - 99 mg/dL   Comment 1 Notify RN    Comment 2 Document in Chart   Glucose, capillary     Status: Abnormal   Collection Time: 11/09/15  3:14 AM  Result Value Ref Range   Glucose-Capillary 104 (H) 65 - 99 mg/dL  Basic metabolic panel     Status: Abnormal   Collection Time: 11/09/15  5:59 AM  Result Value Ref Range   Sodium 136  135 - 145 mmol/L   Potassium 3.8 3.5 - 5.1 mmol/L   Chloride 106 101 - 111 mmol/L   CO2 20 (L) 22 - 32 mmol/L   Glucose, Bld 121 (H) 65 - 99 mg/dL   BUN 14 6 - 20 mg/dL   Creatinine, Ser 1.28 (H) 0.44 - 1.00 mg/dL   Calcium 7.9 (L) 8.9 - 10.3 mg/dL   GFR calc non Af Amer 40 (L) >60 mL/min   GFR calc Af Amer 47 (L) >60 mL/min    Comment: (NOTE) The eGFR has been calculated using the CKD EPI equation. This calculation has not been validated in all clinical situations. eGFR's persistently <60 mL/min signify possible Chronic Kidney Disease.    Anion gap 10 5 - 15  CBC     Status: Abnormal   Collection Time: 11/09/15  5:59 AM  Result Value Ref Range   WBC 11.9 (H) 4.0 - 10.5 K/uL   RBC 3.29 (L) 3.87 - 5.11 MIL/uL   Hemoglobin 9.1 (L) 12.0 - 15.0 g/dL   HCT 27.6 (L) 36.0 - 46.0 %   MCV 83.9 78.0 - 100.0 fL   MCH 27.7 26.0 - 34.0 pg   MCHC 33.0 30.0 - 36.0 g/dL   RDW 15.2  11.5 - 15.5 %   Platelets 208 150 - 400 K/uL  Glucose, capillary     Status: Abnormal   Collection Time: 11/09/15  7:34 AM  Result Value Ref Range   Glucose-Capillary 106 (H) 65 - 99 mg/dL    ABGS No results for input(s): PHART, PO2ART, TCO2, HCO3 in the last 72 hours.  Invalid input(s): PCO2 CULTURES Recent Results (from the past 240 hour(s))  MRSA PCR Screening     Status: None   Collection Time: 11/03/15 10:00 PM  Result Value Ref Range Status   MRSA by PCR NEGATIVE NEGATIVE Final    Comment:        The GeneXpert MRSA Assay (FDA approved for NASAL specimens only), is one component of a comprehensive MRSA colonization surveillance program. It is not intended to diagnose MRSA infection nor to guide or monitor treatment for MRSA infections.    Studies/Results: Dg Abd 2 Views  Result Date: 11/08/2015 CLINICAL DATA:  Abdominal distension, small bowel obstruction, hypertension, diabetes mellitus EXAM: ABDOMEN - 2 VIEW COMPARISON:  11/03/2015 FINDINGS: Gaseous distention of small bowel loops and colon throughout abdomen, with small bowel predominance, most consistent with persistent small bowel obstruction. Degree of small-bowel distention is similar that seen on the previous exam. Some stool in sigmoid colon. No definite bowel wall thickening or free air. Osseous demineralization with degenerative disc disease changes thoracolumbar spine. IMPRESSION: Persistent small bowel dilatation but though some gas and stool are present in the colon, overall findings favoring persistent small bowel obstruction. Electronically Signed   By: Lavonia Dana M.D.   On: 11/08/2015 11:09    Medications: I have reviewed the patient's current medications.  Assesment:  Active Problems:   SBO (small bowel obstruction) (HCC)    Acute on chronic renal failure     Hyponatremia       Schizophrenia     Diabetes Mellitus type II  Plan:  Medications reviewed Patient overall looks improving, but has  persistent small bowel obstruction on KUB.  Surgery opinion on further treatment and discharge plan   LOS: 6 days   Cyril Woodmansee 11/09/2015, 8:05 AM

## 2015-11-10 LAB — GLUCOSE, CAPILLARY
Glucose-Capillary: 87 mg/dL (ref 65–99)
Glucose-Capillary: 91 mg/dL (ref 65–99)
Glucose-Capillary: 158 mg/dL — ABNORMAL HIGH (ref 65–99)

## 2015-11-10 MED ORDER — NYSTATIN-TRIAMCINOLONE 100000-0.1 UNIT/GM-% EX OINT: 1.0000 "application " | TOPICAL_OINTMENT | Freq: Two times a day (BID) | CUTANEOUS | 0 refills | Status: DC

## 2015-11-10 MED ORDER — NYSTATIN 100000 UNIT/GM EX POWD: Freq: Three times a day (TID) | CUTANEOUS | Status: DC

## 2015-11-10 NOTE — NC FL2 (Signed)
MEDICAID FL2 LEVEL OF CARE SCREENING TOOL     IDENTIFICATION  Patient Name: Maria Pace Birthdate: 08/14/1940 Sex: female Admission Date (Current Location): 11/03/2015  [place]  [place] [id number] Q Facility and Address:  [facility],  [address], [place] [id number]      Provider Number: [id number]  Attending Physician Name and Address:  Maria Gullyesfaye Fanta, MD  Relative Name and Phone Number:       Current Level of Care: Hospital [facility]val Number:    Date Approved/Denied:   PASRR Number: [id number]  Discharge Plan: Other (Comment) [facility])    Current Diagnoses: Patient Active Problem List   Diagnosis Date Noted  . SBO (small bowel obstruction) (HCC) 11/03/2015    Orientation RESPIRATION BLADDER Height & Weight     Self, Situation, Place  Normal Continent Weight: 219 lb 9.3 oz (99.6 kg) Height:  5' (152.4 cm)  BEHAVIORAL SYMPTOMS/MOOD NEUROLOGICAL BOWEL NUTRITION STATUS  Other (Comment) (n/a)  (n/a) Continent Diet (Soft diet)  AMBULATORY STATUS COMMUNICATION OF NEEDS Skin   Limited Assist Verbally Bruising, Other (Comment) (Rash to bilateral breasts)                       Personal Care Assistance Level of Assistance  Bathing, Feeding, Dressing Bathing Assistance: Limited assistance Feeding assistance: Limited assistance Dressing Assistance: Limited assistance     Functional Limitations Info  Sight, Hearing, Speech Sight Info: Adequate Hearing Info: Adequate Speech Info: Adequate    SPECIAL CARE FACTORS FREQUENCY  PT (By licensed PT)     PT Frequency: Home health              Contractures Contractures Info: Not present    Additional Factors Info  Code Status, Allergies, Psychotropic Code Status Info: Full code Allergies Info: No known allergies Psychotropic Info: Risperdal, Trazodone, Depakene         Current Medications (11/10/2015):  This is  the current hospital active medication list Current Facility-Administered Medications  Medication Dose Route Frequency Provider Last Rate Last Dose  . 0.9 %  sodium chloride infusion   Intravenous Continuous Maria Gullyesfaye Fanta, MD 10 mL/hr at 11/09/15 0540    . albuterol (PROVENTIL) (2.5 MG/3ML) 0.083% nebulizer solution 3 mL  3 mL Inhalation Q6H PRN Maria ChapelSylvester I Ogbata, MD      . atorvastatin (LIPITOR) tablet 10 mg  10 mg Oral q1800 Maria ChapelSylvester I Ogbata, MD   10 mg at 11/09/15 1713  . benztropine (COGENTIN) tablet 1 mg  1 mg Oral BID Maria ChapelSylvester I Ogbata, MD   1 mg at 11/09/15 2225  . bisacodyl (DULCOLAX) suppository 10 mg  10 mg Rectal Daily PRN Maria MachoMark Jenkins, MD      . heparin injection 5,000 Units  5,000 Units Subcutaneous Q8H Maria ChapelSylvester I Ogbata, MD   5,000 Units at 11/10/15 0556  . insulin aspart (novoLOG) injection 0-15 Units  0-15 Units Subcutaneous QID Maria Gullyesfaye Fanta, MD   2 Units at 11/09/15 1600  . loratadine (CLARITIN) tablet 10 mg  10 mg Oral Daily Maria ChapelSylvester I Ogbata, MD   10 mg at 11/09/15 0924  . OLANZapine (ZYPREXA) tablet 20 mg  20 mg Oral QHS Maria ChapelSylvester I Ogbata, MD   20 mg at 11/09/15 2224  . pantoprazole (PROTONIX) EC tablet 40 mg  40 mg Oral Daily Maria ChapelSylvester I Ogbata, MD   40 mg at 11/09/15 0924  . polyethylene glycol (MIRALAX / GLYCOLAX) packet 17  g  17 g Oral BID Maria MachoMark Jenkins, MD   17 g at 11/09/15 [id number]  . potassium chloride SA (K-DUR,KLOR-CON) CR tablet 40 mEq  40 mEq Oral Once Maria Gullyesfaye Fanta, MD      . risperiDONE (RISPERDAL) tablet 2 mg  2 mg Oral BID Maria ChapelSylvester I Ogbata, MD   2 mg at 11/09/15 2225  . traZODone (DESYREL) tablet 50 mg  50 mg Oral QHS Maria ChapelSylvester I Ogbata, MD   50 mg at 11/09/15 2225  . Valproate Sodium (DEPAKENE) solution 250 mg  250 mg Oral TID WC Maria ChapelSylvester I Ogbata, MD   250 mg at 11/09/15 1713     Discharge Medications: Medication List    TAKE these medications   atorvastatin 10 MG tablet Commonly known as:  LIPITOR Take 10 mg by mouth daily.  benztropine 1 MG  tablet Commonly known as:  COGENTIN Take 1 mg by mouth 2 (two) times daily.  docusate sodium 100 MG capsule Commonly known as:  COLACE Take 200 mg by mouth daily.  ibuprofen 800 MG tablet Commonly known as:  ADVIL,MOTRIN Take 800 mg by mouth 2 (two) times daily.  linagliptin 5 MG Tabs tablet Commonly known as:  TRADJENTA Take 5 mg by mouth daily.  lisinopril 5 MG tablet Commonly known as:  PRINIVIL,ZESTRIL Take 5 mg by mouth daily.  loratadine 10 MG tablet Commonly known as:  CLARITIN Take 10 mg by mouth daily.  metFORMIN 1000 MG tablet Commonly known as:  GLUCOPHAGE Take 1,000 mg by mouth 2 (two) times daily with a meal.  nystatin-triamcinolone ointment Commonly known as:  MYCOLOG Apply 1 application topically 2 (two) times daily.  OLANZapine 20 MG tablet Commonly known as:  ZYPREXA Take 20 mg by mouth at bedtime.  pantoprazole 40 MG tablet Commonly known as:  PROTONIX Take 40 mg by mouth daily.  polyethylene glycol powder powder Commonly known as:  GLYCOLAX/MIRALAX Take 17 g by mouth daily as needed for mild constipation or moderate constipation.  PROAIR HFA 108 (90 Base) MCG/ACT inhaler Generic drug:  albuterol Inhale 2 puffs into the lungs every 6 (six) hours as needed for wheezing or shortness of breath.  risperiDONE 2 MG tablet Commonly known as:  RISPERDAL Take 2 mg by mouth 2 (two) times daily.  traZODone 50 MG tablet Commonly known as:  DESYREL Take 50 mg by mouth at bedtime.  triamcinolone 0.025 % ointment Commonly known as:  KENALOG Apply 1 application topically 2 (two) times daily as needed (for irritation).  Valproic Acid 250 MG/5ML Soln Take 5 mLs by mouth 3 (three) times daily with meals.     Relevant Imaging Results:  Relevant Lab Results:   Additional Information    Maria Pace, Maria Pace, [place] [phone]

## 2015-11-10 NOTE — Discharge Summary (Signed)
Physician Discharge Summary  Patient ID: Maria Pace MRN: [id number] DOB/AGE: 73/06/1940 73 y.o. Primary Care Physician:Aydia Maj, MD Admit date: 11/03/2015 Discharge date: 11/10/2015    Discharge Diagnoses:   Active Problems:   SBO (small bowel obstruction) (HCC) Acute on chronic renal failure     Hyponatremia       Schizophrenia     Diabetes Mellitus type II   Medication List    TAKE these medications   atorvastatin 10 MG tablet Commonly known as:  LIPITOR Take 10 mg by mouth daily.   benztropine 1 MG tablet Commonly known as:  COGENTIN Take 1 mg by mouth 2 (two) times daily.   docusate sodium 100 MG capsule Commonly known as:  COLACE Take 200 mg by mouth daily.   ibuprofen 800 MG tablet Commonly known as:  ADVIL,MOTRIN Take 800 mg by mouth 2 (two) times daily.   linagliptin 5 MG Tabs tablet Commonly known as:  TRADJENTA Take 5 mg by mouth daily.   lisinopril 5 MG tablet Commonly known as:  PRINIVIL,ZESTRIL Take 5 mg by mouth daily.   loratadine 10 MG tablet Commonly known as:  CLARITIN Take 10 mg by mouth daily.   metFORMIN 1000 MG tablet Commonly known as:  GLUCOPHAGE Take 1,000 mg by mouth 2 (two) times daily with a meal.   nystatin-triamcinolone ointment Commonly known as:  MYCOLOG Apply 1 application topically 2 (two) times daily.   OLANZapine 20 MG tablet Commonly known as:  ZYPREXA Take 20 mg by mouth at bedtime.   pantoprazole 40 MG tablet Commonly known as:  PROTONIX Take 40 mg by mouth daily.   polyethylene glycol powder powder Commonly known as:  GLYCOLAX/MIRALAX Take 17 g by mouth daily as needed for mild constipation or moderate constipation.   PROAIR HFA 108 (90 Base) MCG/ACT inhaler Generic drug:  albuterol Inhale 2 puffs into the lungs every 6 (six) hours as needed for wheezing or shortness of breath.   risperiDONE 2 MG tablet Commonly known as:  RISPERDAL Take 2 mg by mouth 2 (two) times daily.   traZODone 50 MG  tablet Commonly known as:  DESYREL Take 50 mg by mouth at bedtime.   triamcinolone 0.025 % ointment Commonly known as:  KENALOG Apply 1 application topically 2 (two) times daily as needed (for irritation).   Valproic Acid 250 MG/5ML Soln Take 5 mLs by mouth 3 (three) times daily with meals.       Discharged Condition: improved   Consults: Surgery  Significant Diagnostic Studies: Ct Abdomen Pelvis Wo Contrast  Result Date: 11/03/2015 CLINICAL DATA:  Nausea vomiting for 3 days. Increasing weakness with shortness of breath today. EXAM: CT ABDOMEN AND PELVIS WITHOUT CONTRAST TECHNIQUE: Multidetector CT imaging of the abdomen and pelvis was performed following the standard protocol without IV contrast. COMPARISON:  Current abdominal radiographs suggesting a bowel obstruction. FINDINGS: Lung bases: Heart is mildly enlarged. Mild lung base subsegmental atelectasis. Minimal right pleural effusion. Liver, spleen, gallbladder, pancreas, adrenal glands: Unremarkable. Kidneys, ureters, bladder:  Unremarkable. Uterus and adnexa:  Unremarkable. Lymph nodes:  No adenopathy. Ascites: Small amount pelvic free fluid. Vascular: Mild atherosclerotic calcifications along a normal caliber infrarenal abdominal aorta. Gastrointestinal: Stomach is distended as is the proximal to mid small bowel with the distal ileum decompressed. There is a transition point in the right mid abdomen. Colon is normal in caliber. There is no bowel wall thickening or adjacent inflammatory change. A normal appendix is visualized. No free air. Musculoskeletal: There are degenerative changes throughout  the visualized spine. No osteoblastic or osteolytic lesions. IMPRESSION: 1. Moderate to high-grade small bowel obstruction with the transition point noted in the right mid abdomen, likely due to adhesions. 2. No other acute abnormalities. Electronically Signed   By: Amie Portland M.D.   On: 11/03/2015 16:52   US Renal  Result Date:  11/04/2015 CLINICAL DATA:  Renal failure. EXAM: RENAL / URINARY TRACT ULTRASOUND COMPLETE COMPARISON:  CT scan of November 03, 2015. FINDINGS: Right Kidney: Length: 12.7 cm. Slightly increased echogenicity of renal parenchyma is noted. No mass or hydronephrosis visualized. Left Kidney: Length: 12 cm. Slightly increased echogenicity of renal parenchyma is noted. No mass or hydronephrosis visualized. Bladder: Nondistended secondary to Foley catheter. IMPRESSION: Slightly increased echogenicity of renal parenchyma is noted bilaterally consistent with medical renal disease. No hydronephrosis or renal obstruction is noted. Electronically Signed   By: Lupita Raider, M.D.   On: 11/04/2015 08:46   Dg Abd 2 Views  Result Date: 11/08/2015 CLINICAL DATA:  Abdominal distension, small bowel obstruction, hypertension, diabetes mellitus EXAM: ABDOMEN - 2 VIEW COMPARISON:  11/03/2015 FINDINGS: Gaseous distention of small bowel loops and colon throughout abdomen, with small bowel predominance, most consistent with persistent small bowel obstruction. Degree of small-bowel distention is similar that seen on the previous exam. Some stool in sigmoid colon. No definite bowel wall thickening or free air. Osseous demineralization with degenerative disc disease changes thoracolumbar spine. IMPRESSION: Persistent small bowel dilatation but though some gas and stool are present in the colon, overall findings favoring persistent small bowel obstruction. Electronically Signed   By: Ulyses Southward M.D.   On: 11/08/2015 11:09   Dg Abd Acute W/chest  Result Date: 11/03/2015 CLINICAL DATA:  Nausea, vomiting, and diarrhea since Sunday. Abdominal pain and distention. EXAM: DG ABDOMEN ACUTE W/ 1V CHEST COMPARISON:  10/02/2014 chest radiograph FINDINGS: Mild enlargement of the cardiopericardial silhouette. Stable small notch like left apical density, no change from 07/20/2013, likely from scarring. Subsegmental atelectasis at the right lung base. Low  lung volumes are present, causing crowding of the pulmonary vasculature. Dilated loops of small bowel up to 6 cm in diameter, with air- fluid levels at differing vertical heights in some loops, suggesting small bowel obstruction. Paucity of gas in the right lower quadrant, questionable mass effect or mass lesion. There is some formed stool in the descending colon. The rack a lumbar spondylosis. No compelling findings of free intraperitoneal gas. IMPRESSION: 1. Dilated loops of small bowel with air-fluid levels suggesting small bowel obstruction. 2. Paucity of right lower quadrant bowel, query mass lesion in this region. 3. CT of the abdomen and pelvis is suggested for further workup. 4. Subsegmental atelectasis at the right lung base with low lung volumes. 5. Mild enlargement of the cardiopericardial silhouette. Electronically Signed   By: Gaylyn Rong M.D.   On: 11/03/2015 15:02    Lab Results: Basic Metabolic Panel:  Recent Labs  16/10/96 0559  NA 136  K 3.8  CL 106  CO2 20*  GLUCOSE 121*  BUN 14  CREATININE 1.28*  CALCIUM 7.9*   Liver Function Tests: No results for input(s): AST, ALT, ALKPHOS, BILITOT, PROT, ALBUMIN in the last 72 hours.   CBC:  Recent Labs  11/09/15 0559  WBC 11.9*  HGB 9.1*  HCT 27.6*  MCV 83.9  PLT 208    Recent Results (from the past 240 hour(s))  MRSA PCR Screening     Status: None   Collection Time: 11/03/15 10:00 PM  Result Value  Ref Range Status   MRSA by PCR NEGATIVE NEGATIVE Final    Comment:        The GeneXpert MRSA Assay (FDA approved for NASAL specimens only), is one component of a comprehensive MRSA colonization surveillance program. It is not intended to diagnose MRSA infection nor to guide or monitor treatment for MRSA infections.      Hospital Course:   This is a [age] years old female with history of multiple medical illnesses was admitted due small bowel obstruction and acute renal failure with hyponatremia. Patient was  admitted and was started on IV hydration and NG tube was inserted. Surgical consult was who assisted in the patient management. She was continue on conservative management according to surgery recommendation. Her acute renal failure and hyponatremia resolved. Patient started moving her bowel and was able to tolerate regular diet. Patient is cleared for discharge by surgery.  Discharge Exam: Blood pressure (P) 127/63, pulse (P) 94, temperature (P) 98.1 F (36.7 C), temperature source (P) Oral, resp. rate (P) 20, height 5' (1.524 m), weight 99.6 kg (219 lb 9.3 oz), SpO2 (P) 95 %.   Disposition:  Assisted living      Signed: Brytani Voth   11/10/2015, 8:34 AM

## 2015-11-10 NOTE — Progress Notes (Signed)
Pt.'s IV discontinued.[facility]

## 2015-11-10 NOTE — Care Management Note (Signed)
Case Management Note  Patient Details  Name: Maria Pace MRN: [id number] Date of Birth: 04/11/1940   Expected Discharge Date:      11/10/2015            Expected Discharge Plan:  Assisted Living / Rest Home  In-House Referral:  Clinical Social Work  Discharge planning Services  CM Consult  Post Acute Care Choice:  Home Health Choice offered to:  Patient  DME Arranged:    DME Agency:     HH Arranged:  RN, PT HH Agency:  [facility]  Status of Service:  Completed, signed off  If discussed at Long Length of Stay Meetings, dates discussed:  11/10/2015  Additional Comments: [facility]. Pt/facility is aware that [facility] to [facility] services. Maria [facility]ace, [facility] is aware of referral and will obtain pt info from chart. CSW has made arrangements for return to facility.   Maria Pace, Maria Behrens Demske, RN 11/10/2015, 10:53 AM

## 2015-11-10 NOTE — Progress Notes (Signed)
Physical Therapy Treatment Patient Details Name: Maria Pace MRN: [id number] DOB: 01/08/1941 Today's Date: 11/10/2015    History of Present Illness 75 year old female with history of bowel obstruction. Patient that her abdomen became distended yesterday, with associated abdominal pain, nausea, vomiting and abdominal pain. CT Scan of the abdomen done revealed moderate to high grade SBO.     PT Comments    Pt received in bed, and was agreeable to PT tx, and expressed need to use the bathroom.  [facility] and [facility], but was total care for peri-hygiene.  Pt was able to ambulate 655ft today with RW from the BSC<>chair, however declined further ambulation due to fatigue, however, this is an improvement from yesterday's tx.  Continue to recommend that she return to ALF with HHPT.    Follow Up Recommendations  Home health PT;Supervision/Assistance - 24 hour (return to ALF)     Equipment Recommendations  None recommended by PT    Recommendations for Other Services       Precautions / Restrictions Precautions Precautions: Fall Precaution Comments: Due to weakness and recent immobility Restrictions Weight Bearing Restrictions: No    Mobility  Bed Mobility Overal bed mobility: Needs Assistance Bed Mobility: Supine to Sit     Supine to sit: Min assist;HOB elevated     General bed mobility comments: Pt continues to require increased time, and minimal use of bed pad to assist pt's hips to the EOB.    Transfers Overall transfer level: Needs assistance Equipment used: Rolling walker (2 wheeled) Transfers: Sit to/from Stand Sit to Stand: Min assist (From the bed x1, and then [facility].  Vc's to push up from the bed, and [facility].  ) Stand pivot transfers: Min assist;Min guard (Vc's for safe hand placement and to reach back prior to sitting down. )          Ambulation/Gait Ambulation/Gait assistance: Min guard Ambulation Distance (Feet): 5  Feet Assistive device: Rolling walker (2 wheeled) Gait Pattern/deviations: Step-to pattern;Wide base of support;Trunk flexed;Antalgic     General Gait Details: labored gait with audible crepitus of B knees.  Pt expressed she was too fatigued to attempt further gait distance at this time.    Stairs            Wheelchair Mobility    Modified Rankin (Stroke Patients Only)       Balance Overall balance assessment: Needs assistance Sitting-balance support: Bilateral upper extremity supported Sitting balance-Leahy Scale: Good     Standing balance support: Bilateral upper extremity supported Standing balance-Leahy Scale: Fair                      Cognition Arousal/Alertness: Awake/alert Behavior During Therapy: WFL for tasks assessed/performed Overall Cognitive Status: Within Functional Limits for tasks assessed                      Exercises      General Comments        Pertinent Vitals/Pain Pain Assessment: No/denies pain    Home Living                      Prior Function            PT Goals (current goals can now be found in the care plan section) Acute Rehab PT Goals Patient Stated Goal: Pt wants to get stronger and go  back to her ALF.  PT Goal Formulation: With patient Time For Goal Achievement: 11/16/15 Potential to Achieve Goals: Good Progress towards PT goals: Progressing toward goals    Frequency  Min 3X/week    PT Plan Current plan remains appropriate    Co-evaluation             End of Session Equipment Utilized During Treatment: Gait belt Activity Tolerance: Patient tolerated treatment well Patient left: in chair;with call bell/phone within reach     Time: [id number] PT Time Calculation (min) (ACUTE ONLY): 41 min  Charges:  $Gait Training: 8-22 mins $Therapeutic Activity: 8-22 mins                    G Codes:      Beth Avonelle Viveros, PT, DPT X: [phone]

## 2015-11-10 NOTE — Clinical Social Work Note (Signed)
Pt d/c today back [name]. Pt is eager to return. Facility aware pt will require additional assistance and home health. CSW left voicemail for [facility] at pt's request. Facility to provide transport this afternoon.   Derenda FennelKara Abed Schar, [name] [phone]

## 2015-11-11 DIAGNOSIS — E7800 Pure hypercholesterolemia, unspecified: Secondary | ICD-10-CM | POA: Diagnosis not present

## 2015-11-11 DIAGNOSIS — K219 Gastro-esophageal reflux disease without esophagitis: Secondary | ICD-10-CM | POA: Diagnosis not present

## 2015-11-11 DIAGNOSIS — K5660 Unspecified intestinal obstruction: Secondary | ICD-10-CM | POA: Diagnosis not present

## 2015-11-11 DIAGNOSIS — Z7984 Long term (current) use of oral hypoglycemic drugs: Secondary | ICD-10-CM | POA: Diagnosis not present

## 2015-11-11 DIAGNOSIS — N189 Chronic kidney disease, unspecified: Secondary | ICD-10-CM | POA: Diagnosis not present

## 2015-11-11 DIAGNOSIS — E1122 Type 2 diabetes mellitus with diabetic chronic kidney disease: Secondary | ICD-10-CM | POA: Diagnosis not present

## 2015-11-11 DIAGNOSIS — I129 Hypertensive chronic kidney disease with stage 1 through stage 4 chronic kidney disease, or unspecified chronic kidney disease: Secondary | ICD-10-CM | POA: Diagnosis not present

## 2015-11-11 DIAGNOSIS — F319 Bipolar disorder, unspecified: Secondary | ICD-10-CM | POA: Diagnosis not present

## 2015-11-11 DIAGNOSIS — R262 Difficulty in walking, not elsewhere classified: Secondary | ICD-10-CM | POA: Diagnosis not present

## 2015-11-11 DIAGNOSIS — F209 Schizophrenia, unspecified: Secondary | ICD-10-CM | POA: Diagnosis not present

## 2015-11-13 DIAGNOSIS — E1122 Type 2 diabetes mellitus with diabetic chronic kidney disease: Secondary | ICD-10-CM | POA: Diagnosis not present

## 2015-11-13 DIAGNOSIS — I129 Hypertensive chronic kidney disease with stage 1 through stage 4 chronic kidney disease, or unspecified chronic kidney disease: Secondary | ICD-10-CM | POA: Diagnosis not present

## 2015-11-13 DIAGNOSIS — F319 Bipolar disorder, unspecified: Secondary | ICD-10-CM | POA: Diagnosis not present

## 2015-11-13 DIAGNOSIS — R262 Difficulty in walking, not elsewhere classified: Secondary | ICD-10-CM | POA: Diagnosis not present

## 2015-11-13 DIAGNOSIS — K5660 Unspecified intestinal obstruction: Secondary | ICD-10-CM | POA: Diagnosis not present

## 2015-11-13 DIAGNOSIS — N189 Chronic kidney disease, unspecified: Secondary | ICD-10-CM | POA: Diagnosis not present

## 2015-11-16 DIAGNOSIS — F319 Bipolar disorder, unspecified: Secondary | ICD-10-CM | POA: Diagnosis not present

## 2015-11-16 DIAGNOSIS — K5660 Unspecified intestinal obstruction: Secondary | ICD-10-CM | POA: Diagnosis not present

## 2015-11-16 DIAGNOSIS — E1165 Type 2 diabetes mellitus with hyperglycemia: Secondary | ICD-10-CM | POA: Diagnosis not present

## 2015-11-16 DIAGNOSIS — N189 Chronic kidney disease, unspecified: Secondary | ICD-10-CM | POA: Diagnosis not present

## 2015-11-16 DIAGNOSIS — F203 Undifferentiated schizophrenia: Secondary | ICD-10-CM | POA: Diagnosis not present

## 2015-11-16 DIAGNOSIS — R262 Difficulty in walking, not elsewhere classified: Secondary | ICD-10-CM | POA: Diagnosis not present

## 2015-11-16 DIAGNOSIS — I10 Essential (primary) hypertension: Secondary | ICD-10-CM | POA: Diagnosis not present

## 2015-11-16 DIAGNOSIS — E1122 Type 2 diabetes mellitus with diabetic chronic kidney disease: Secondary | ICD-10-CM | POA: Diagnosis not present

## 2015-11-16 DIAGNOSIS — I129 Hypertensive chronic kidney disease with stage 1 through stage 4 chronic kidney disease, or unspecified chronic kidney disease: Secondary | ICD-10-CM | POA: Diagnosis not present

## 2015-11-17 DIAGNOSIS — I129 Hypertensive chronic kidney disease with stage 1 through stage 4 chronic kidney disease, or unspecified chronic kidney disease: Secondary | ICD-10-CM | POA: Diagnosis not present

## 2015-11-17 DIAGNOSIS — F319 Bipolar disorder, unspecified: Secondary | ICD-10-CM | POA: Diagnosis not present

## 2015-11-17 DIAGNOSIS — K5660 Unspecified intestinal obstruction: Secondary | ICD-10-CM | POA: Diagnosis not present

## 2015-11-17 DIAGNOSIS — E1122 Type 2 diabetes mellitus with diabetic chronic kidney disease: Secondary | ICD-10-CM | POA: Diagnosis not present

## 2015-11-17 DIAGNOSIS — N189 Chronic kidney disease, unspecified: Secondary | ICD-10-CM | POA: Diagnosis not present

## 2015-11-17 DIAGNOSIS — R262 Difficulty in walking, not elsewhere classified: Secondary | ICD-10-CM | POA: Diagnosis not present

## 2015-11-19 DIAGNOSIS — I129 Hypertensive chronic kidney disease with stage 1 through stage 4 chronic kidney disease, or unspecified chronic kidney disease: Secondary | ICD-10-CM | POA: Diagnosis not present

## 2015-11-19 DIAGNOSIS — E1122 Type 2 diabetes mellitus with diabetic chronic kidney disease: Secondary | ICD-10-CM | POA: Diagnosis not present

## 2015-11-19 DIAGNOSIS — R262 Difficulty in walking, not elsewhere classified: Secondary | ICD-10-CM | POA: Diagnosis not present

## 2015-11-19 DIAGNOSIS — F319 Bipolar disorder, unspecified: Secondary | ICD-10-CM | POA: Diagnosis not present

## 2015-11-19 DIAGNOSIS — N189 Chronic kidney disease, unspecified: Secondary | ICD-10-CM | POA: Diagnosis not present

## 2015-11-19 DIAGNOSIS — K5660 Unspecified intestinal obstruction: Secondary | ICD-10-CM | POA: Diagnosis not present

## 2015-11-20 DIAGNOSIS — F319 Bipolar disorder, unspecified: Secondary | ICD-10-CM | POA: Diagnosis not present

## 2015-11-20 DIAGNOSIS — K5660 Unspecified intestinal obstruction: Secondary | ICD-10-CM | POA: Diagnosis not present

## 2015-11-20 DIAGNOSIS — N189 Chronic kidney disease, unspecified: Secondary | ICD-10-CM | POA: Diagnosis not present

## 2015-11-20 DIAGNOSIS — E1122 Type 2 diabetes mellitus with diabetic chronic kidney disease: Secondary | ICD-10-CM | POA: Diagnosis not present

## 2015-11-20 DIAGNOSIS — R262 Difficulty in walking, not elsewhere classified: Secondary | ICD-10-CM | POA: Diagnosis not present

## 2015-11-20 DIAGNOSIS — I129 Hypertensive chronic kidney disease with stage 1 through stage 4 chronic kidney disease, or unspecified chronic kidney disease: Secondary | ICD-10-CM | POA: Diagnosis not present

## 2015-11-24 DIAGNOSIS — R262 Difficulty in walking, not elsewhere classified: Secondary | ICD-10-CM | POA: Diagnosis not present

## 2015-11-24 DIAGNOSIS — I129 Hypertensive chronic kidney disease with stage 1 through stage 4 chronic kidney disease, or unspecified chronic kidney disease: Secondary | ICD-10-CM | POA: Diagnosis not present

## 2015-11-24 DIAGNOSIS — K5660 Unspecified intestinal obstruction: Secondary | ICD-10-CM | POA: Diagnosis not present

## 2015-11-24 DIAGNOSIS — F319 Bipolar disorder, unspecified: Secondary | ICD-10-CM | POA: Diagnosis not present

## 2015-11-24 DIAGNOSIS — E1122 Type 2 diabetes mellitus with diabetic chronic kidney disease: Secondary | ICD-10-CM | POA: Diagnosis not present

## 2015-11-24 DIAGNOSIS — N189 Chronic kidney disease, unspecified: Secondary | ICD-10-CM | POA: Diagnosis not present

## 2015-11-25 DIAGNOSIS — E1122 Type 2 diabetes mellitus with diabetic chronic kidney disease: Secondary | ICD-10-CM | POA: Diagnosis not present

## 2015-11-25 DIAGNOSIS — F319 Bipolar disorder, unspecified: Secondary | ICD-10-CM | POA: Diagnosis not present

## 2015-11-25 DIAGNOSIS — K5660 Unspecified intestinal obstruction: Secondary | ICD-10-CM | POA: Diagnosis not present

## 2015-11-25 DIAGNOSIS — R262 Difficulty in walking, not elsewhere classified: Secondary | ICD-10-CM | POA: Diagnosis not present

## 2015-11-25 DIAGNOSIS — I129 Hypertensive chronic kidney disease with stage 1 through stage 4 chronic kidney disease, or unspecified chronic kidney disease: Secondary | ICD-10-CM | POA: Diagnosis not present

## 2015-11-25 DIAGNOSIS — N189 Chronic kidney disease, unspecified: Secondary | ICD-10-CM | POA: Diagnosis not present

## 2015-11-26 ENCOUNTER — Other Ambulatory Visit: Payer: Self-pay

## 2015-11-26 DIAGNOSIS — F319 Bipolar disorder, unspecified: Secondary | ICD-10-CM | POA: Diagnosis not present

## 2015-11-26 DIAGNOSIS — K5660 Unspecified intestinal obstruction: Secondary | ICD-10-CM | POA: Diagnosis not present

## 2015-11-26 DIAGNOSIS — R262 Difficulty in walking, not elsewhere classified: Secondary | ICD-10-CM | POA: Diagnosis not present

## 2015-11-26 DIAGNOSIS — I129 Hypertensive chronic kidney disease with stage 1 through stage 4 chronic kidney disease, or unspecified chronic kidney disease: Secondary | ICD-10-CM | POA: Diagnosis not present

## 2015-11-26 DIAGNOSIS — E1122 Type 2 diabetes mellitus with diabetic chronic kidney disease: Secondary | ICD-10-CM | POA: Diagnosis not present

## 2015-11-26 DIAGNOSIS — N189 Chronic kidney disease, unspecified: Secondary | ICD-10-CM | POA: Diagnosis not present

## 2015-11-27 DIAGNOSIS — I129 Hypertensive chronic kidney disease with stage 1 through stage 4 chronic kidney disease, or unspecified chronic kidney disease: Secondary | ICD-10-CM | POA: Diagnosis not present

## 2015-11-27 DIAGNOSIS — N189 Chronic kidney disease, unspecified: Secondary | ICD-10-CM | POA: Diagnosis not present

## 2015-11-27 DIAGNOSIS — F319 Bipolar disorder, unspecified: Secondary | ICD-10-CM | POA: Diagnosis not present

## 2015-11-27 DIAGNOSIS — R262 Difficulty in walking, not elsewhere classified: Secondary | ICD-10-CM | POA: Diagnosis not present

## 2015-11-27 DIAGNOSIS — E1122 Type 2 diabetes mellitus with diabetic chronic kidney disease: Secondary | ICD-10-CM | POA: Diagnosis not present

## 2015-11-27 DIAGNOSIS — K5660 Unspecified intestinal obstruction: Secondary | ICD-10-CM | POA: Diagnosis not present

## 2015-11-30 DIAGNOSIS — K5660 Unspecified intestinal obstruction: Secondary | ICD-10-CM | POA: Diagnosis not present

## 2015-11-30 DIAGNOSIS — R262 Difficulty in walking, not elsewhere classified: Secondary | ICD-10-CM | POA: Diagnosis not present

## 2015-11-30 DIAGNOSIS — I129 Hypertensive chronic kidney disease with stage 1 through stage 4 chronic kidney disease, or unspecified chronic kidney disease: Secondary | ICD-10-CM | POA: Diagnosis not present

## 2015-11-30 DIAGNOSIS — E1122 Type 2 diabetes mellitus with diabetic chronic kidney disease: Secondary | ICD-10-CM | POA: Diagnosis not present

## 2015-11-30 DIAGNOSIS — N189 Chronic kidney disease, unspecified: Secondary | ICD-10-CM | POA: Diagnosis not present

## 2015-11-30 DIAGNOSIS — F319 Bipolar disorder, unspecified: Secondary | ICD-10-CM | POA: Diagnosis not present

## 2015-12-02 DIAGNOSIS — E1122 Type 2 diabetes mellitus with diabetic chronic kidney disease: Secondary | ICD-10-CM | POA: Diagnosis not present

## 2015-12-02 DIAGNOSIS — N189 Chronic kidney disease, unspecified: Secondary | ICD-10-CM | POA: Diagnosis not present

## 2015-12-02 DIAGNOSIS — F319 Bipolar disorder, unspecified: Secondary | ICD-10-CM | POA: Diagnosis not present

## 2015-12-02 DIAGNOSIS — I129 Hypertensive chronic kidney disease with stage 1 through stage 4 chronic kidney disease, or unspecified chronic kidney disease: Secondary | ICD-10-CM | POA: Diagnosis not present

## 2015-12-02 DIAGNOSIS — K5660 Unspecified intestinal obstruction: Secondary | ICD-10-CM | POA: Diagnosis not present

## 2015-12-02 DIAGNOSIS — R262 Difficulty in walking, not elsewhere classified: Secondary | ICD-10-CM | POA: Diagnosis not present

## 2015-12-03 DIAGNOSIS — E1122 Type 2 diabetes mellitus with diabetic chronic kidney disease: Secondary | ICD-10-CM | POA: Diagnosis not present

## 2015-12-03 DIAGNOSIS — R262 Difficulty in walking, not elsewhere classified: Secondary | ICD-10-CM | POA: Diagnosis not present

## 2015-12-03 DIAGNOSIS — F319 Bipolar disorder, unspecified: Secondary | ICD-10-CM | POA: Diagnosis not present

## 2015-12-03 DIAGNOSIS — I129 Hypertensive chronic kidney disease with stage 1 through stage 4 chronic kidney disease, or unspecified chronic kidney disease: Secondary | ICD-10-CM | POA: Diagnosis not present

## 2015-12-03 DIAGNOSIS — N189 Chronic kidney disease, unspecified: Secondary | ICD-10-CM | POA: Diagnosis not present

## 2015-12-03 DIAGNOSIS — K5660 Unspecified intestinal obstruction: Secondary | ICD-10-CM | POA: Diagnosis not present

## 2015-12-07 DIAGNOSIS — R262 Difficulty in walking, not elsewhere classified: Secondary | ICD-10-CM | POA: Diagnosis not present

## 2015-12-07 DIAGNOSIS — E1122 Type 2 diabetes mellitus with diabetic chronic kidney disease: Secondary | ICD-10-CM | POA: Diagnosis not present

## 2015-12-07 DIAGNOSIS — F319 Bipolar disorder, unspecified: Secondary | ICD-10-CM | POA: Diagnosis not present

## 2015-12-07 DIAGNOSIS — I129 Hypertensive chronic kidney disease with stage 1 through stage 4 chronic kidney disease, or unspecified chronic kidney disease: Secondary | ICD-10-CM | POA: Diagnosis not present

## 2015-12-07 DIAGNOSIS — N189 Chronic kidney disease, unspecified: Secondary | ICD-10-CM | POA: Diagnosis not present

## 2015-12-07 DIAGNOSIS — K5660 Unspecified intestinal obstruction: Secondary | ICD-10-CM | POA: Diagnosis not present

## 2015-12-17 DIAGNOSIS — I10 Essential (primary) hypertension: Secondary | ICD-10-CM | POA: Diagnosis not present

## 2015-12-17 DIAGNOSIS — E1165 Type 2 diabetes mellitus with hyperglycemia: Secondary | ICD-10-CM | POA: Diagnosis not present

## 2015-12-22 DIAGNOSIS — I739 Peripheral vascular disease, unspecified: Secondary | ICD-10-CM | POA: Diagnosis not present

## 2015-12-22 DIAGNOSIS — E1142 Type 2 diabetes mellitus with diabetic polyneuropathy: Secondary | ICD-10-CM | POA: Diagnosis not present

## 2015-12-22 DIAGNOSIS — B351 Tinea unguium: Secondary | ICD-10-CM | POA: Diagnosis not present

## 2015-12-25 DIAGNOSIS — E1122 Type 2 diabetes mellitus with diabetic chronic kidney disease: Secondary | ICD-10-CM | POA: Diagnosis not present

## 2015-12-25 DIAGNOSIS — R262 Difficulty in walking, not elsewhere classified: Secondary | ICD-10-CM | POA: Diagnosis not present

## 2015-12-25 DIAGNOSIS — F319 Bipolar disorder, unspecified: Secondary | ICD-10-CM | POA: Diagnosis not present

## 2015-12-25 DIAGNOSIS — N189 Chronic kidney disease, unspecified: Secondary | ICD-10-CM | POA: Diagnosis not present

## 2015-12-25 DIAGNOSIS — I129 Hypertensive chronic kidney disease with stage 1 through stage 4 chronic kidney disease, or unspecified chronic kidney disease: Secondary | ICD-10-CM | POA: Diagnosis not present

## 2015-12-25 DIAGNOSIS — K5660 Unspecified intestinal obstruction: Secondary | ICD-10-CM | POA: Diagnosis not present

## 2015-12-28 DIAGNOSIS — E1165 Type 2 diabetes mellitus with hyperglycemia: Secondary | ICD-10-CM | POA: Diagnosis not present

## 2016-01-05 DIAGNOSIS — K5660 Unspecified intestinal obstruction: Secondary | ICD-10-CM | POA: Diagnosis not present

## 2016-01-05 DIAGNOSIS — F203 Undifferentiated schizophrenia: Secondary | ICD-10-CM | POA: Diagnosis not present

## 2016-01-05 DIAGNOSIS — F319 Bipolar disorder, unspecified: Secondary | ICD-10-CM | POA: Diagnosis not present

## 2016-01-05 DIAGNOSIS — N189 Chronic kidney disease, unspecified: Secondary | ICD-10-CM | POA: Diagnosis not present

## 2016-01-05 DIAGNOSIS — E119 Type 2 diabetes mellitus without complications: Secondary | ICD-10-CM | POA: Diagnosis not present

## 2016-01-05 DIAGNOSIS — E1122 Type 2 diabetes mellitus with diabetic chronic kidney disease: Secondary | ICD-10-CM | POA: Diagnosis not present

## 2016-01-05 DIAGNOSIS — I10 Essential (primary) hypertension: Secondary | ICD-10-CM | POA: Diagnosis not present

## 2016-01-05 DIAGNOSIS — Z23 Encounter for immunization: Secondary | ICD-10-CM | POA: Diagnosis not present

## 2016-01-05 DIAGNOSIS — I129 Hypertensive chronic kidney disease with stage 1 through stage 4 chronic kidney disease, or unspecified chronic kidney disease: Secondary | ICD-10-CM | POA: Diagnosis not present

## 2016-01-05 DIAGNOSIS — R262 Difficulty in walking, not elsewhere classified: Secondary | ICD-10-CM | POA: Diagnosis not present

## 2016-01-05 DIAGNOSIS — M1990 Unspecified osteoarthritis, unspecified site: Secondary | ICD-10-CM | POA: Diagnosis not present

## 2016-01-26 DIAGNOSIS — F332 Major depressive disorder, recurrent severe without psychotic features: Secondary | ICD-10-CM | POA: Diagnosis not present

## 2016-02-05 DIAGNOSIS — I10 Essential (primary) hypertension: Secondary | ICD-10-CM | POA: Diagnosis not present

## 2016-02-05 DIAGNOSIS — E119 Type 2 diabetes mellitus without complications: Secondary | ICD-10-CM | POA: Diagnosis not present

## 2016-03-01 DIAGNOSIS — I739 Peripheral vascular disease, unspecified: Secondary | ICD-10-CM | POA: Diagnosis not present

## 2016-03-01 DIAGNOSIS — B351 Tinea unguium: Secondary | ICD-10-CM | POA: Diagnosis not present

## 2016-03-01 DIAGNOSIS — E1142 Type 2 diabetes mellitus with diabetic polyneuropathy: Secondary | ICD-10-CM | POA: Diagnosis not present

## 2016-03-06 DIAGNOSIS — I10 Essential (primary) hypertension: Secondary | ICD-10-CM | POA: Diagnosis not present

## 2016-03-06 DIAGNOSIS — E119 Type 2 diabetes mellitus without complications: Secondary | ICD-10-CM | POA: Diagnosis not present

## 2016-04-06 DIAGNOSIS — Z0000 Encounter for general adult medical examination without abnormal findings: Secondary | ICD-10-CM | POA: Diagnosis not present

## 2016-04-06 DIAGNOSIS — F203 Undifferentiated schizophrenia: Secondary | ICD-10-CM | POA: Diagnosis not present

## 2016-04-06 DIAGNOSIS — E119 Type 2 diabetes mellitus without complications: Secondary | ICD-10-CM | POA: Diagnosis not present

## 2016-04-06 DIAGNOSIS — E669 Obesity, unspecified: Secondary | ICD-10-CM | POA: Diagnosis not present

## 2016-04-06 DIAGNOSIS — E1165 Type 2 diabetes mellitus with hyperglycemia: Secondary | ICD-10-CM | POA: Diagnosis not present

## 2016-04-06 DIAGNOSIS — F209 Schizophrenia, unspecified: Secondary | ICD-10-CM | POA: Diagnosis not present

## 2016-04-06 DIAGNOSIS — I10 Essential (primary) hypertension: Secondary | ICD-10-CM | POA: Diagnosis not present

## 2016-04-26 DIAGNOSIS — E119 Type 2 diabetes mellitus without complications: Secondary | ICD-10-CM | POA: Diagnosis not present

## 2016-04-26 DIAGNOSIS — H401131 Primary open-angle glaucoma, bilateral, mild stage: Secondary | ICD-10-CM | POA: Diagnosis not present

## 2016-04-26 DIAGNOSIS — Z961 Presence of intraocular lens: Secondary | ICD-10-CM | POA: Diagnosis not present

## 2016-05-02 DIAGNOSIS — F332 Major depressive disorder, recurrent severe without psychotic features: Secondary | ICD-10-CM | POA: Diagnosis not present

## 2016-05-07 DIAGNOSIS — I10 Essential (primary) hypertension: Secondary | ICD-10-CM | POA: Diagnosis not present

## 2016-05-07 DIAGNOSIS — E1165 Type 2 diabetes mellitus with hyperglycemia: Secondary | ICD-10-CM | POA: Diagnosis not present

## 2016-05-10 DIAGNOSIS — E1142 Type 2 diabetes mellitus with diabetic polyneuropathy: Secondary | ICD-10-CM | POA: Diagnosis not present

## 2016-05-10 DIAGNOSIS — I739 Peripheral vascular disease, unspecified: Secondary | ICD-10-CM | POA: Diagnosis not present

## 2016-05-10 DIAGNOSIS — B351 Tinea unguium: Secondary | ICD-10-CM | POA: Diagnosis not present

## 2016-05-24 DIAGNOSIS — H401131 Primary open-angle glaucoma, bilateral, mild stage: Secondary | ICD-10-CM | POA: Diagnosis not present

## 2016-06-04 DIAGNOSIS — E119 Type 2 diabetes mellitus without complications: Secondary | ICD-10-CM | POA: Diagnosis not present

## 2016-06-04 DIAGNOSIS — I10 Essential (primary) hypertension: Secondary | ICD-10-CM | POA: Diagnosis not present

## 2016-07-05 DIAGNOSIS — F203 Undifferentiated schizophrenia: Secondary | ICD-10-CM | POA: Diagnosis not present

## 2016-07-05 DIAGNOSIS — I10 Essential (primary) hypertension: Secondary | ICD-10-CM | POA: Diagnosis not present

## 2016-07-05 DIAGNOSIS — E119 Type 2 diabetes mellitus without complications: Secondary | ICD-10-CM | POA: Diagnosis not present

## 2016-07-19 DIAGNOSIS — B351 Tinea unguium: Secondary | ICD-10-CM | POA: Diagnosis not present

## 2016-07-19 DIAGNOSIS — I739 Peripheral vascular disease, unspecified: Secondary | ICD-10-CM | POA: Diagnosis not present

## 2016-07-19 DIAGNOSIS — E1142 Type 2 diabetes mellitus with diabetic polyneuropathy: Secondary | ICD-10-CM | POA: Diagnosis not present

## 2016-08-04 DIAGNOSIS — I10 Essential (primary) hypertension: Secondary | ICD-10-CM | POA: Diagnosis not present

## 2016-08-04 DIAGNOSIS — E119 Type 2 diabetes mellitus without complications: Secondary | ICD-10-CM | POA: Diagnosis not present

## 2016-08-09 DIAGNOSIS — F332 Major depressive disorder, recurrent severe without psychotic features: Secondary | ICD-10-CM | POA: Diagnosis not present

## 2016-08-23 DIAGNOSIS — H401131 Primary open-angle glaucoma, bilateral, mild stage: Secondary | ICD-10-CM | POA: Diagnosis not present

## 2016-09-08 DIAGNOSIS — E119 Type 2 diabetes mellitus without complications: Secondary | ICD-10-CM | POA: Diagnosis not present

## 2016-09-08 DIAGNOSIS — L259 Unspecified contact dermatitis, unspecified cause: Secondary | ICD-10-CM | POA: Diagnosis not present

## 2016-10-04 DIAGNOSIS — I739 Peripheral vascular disease, unspecified: Secondary | ICD-10-CM | POA: Diagnosis not present

## 2016-10-04 DIAGNOSIS — B351 Tinea unguium: Secondary | ICD-10-CM | POA: Diagnosis not present

## 2016-10-04 DIAGNOSIS — E1142 Type 2 diabetes mellitus with diabetic polyneuropathy: Secondary | ICD-10-CM | POA: Diagnosis not present

## 2016-10-08 DIAGNOSIS — E1165 Type 2 diabetes mellitus with hyperglycemia: Secondary | ICD-10-CM | POA: Diagnosis not present

## 2016-10-08 DIAGNOSIS — I10 Essential (primary) hypertension: Secondary | ICD-10-CM | POA: Diagnosis not present

## 2016-11-09 ENCOUNTER — Emergency Department (HOSPITAL_COMMUNITY): Payer: Medicare Other

## 2016-11-09 ENCOUNTER — Inpatient Hospital Stay (HOSPITAL_COMMUNITY)
Admission: EM | Admit: 2016-11-09 | Discharge: 2016-11-14 | DRG: 690 | Disposition: A | Discharge status: Nursing Home (Includes ICF) | Payer: Medicare Other | Attending: Internal Medicine | Admitting: Internal Medicine

## 2016-11-09 ENCOUNTER — Encounter (HOSPITAL_COMMUNITY): Payer: Self-pay | Admitting: *Deleted

## 2016-11-09 DIAGNOSIS — E119 Type 2 diabetes mellitus without complications: Secondary | ICD-10-CM | POA: Diagnosis not present

## 2016-11-09 DIAGNOSIS — N1330 Unspecified hydronephrosis: Secondary | ICD-10-CM | POA: Diagnosis not present

## 2016-11-09 DIAGNOSIS — R000 Tachycardia, unspecified: Secondary | ICD-10-CM | POA: Diagnosis not present

## 2016-11-09 DIAGNOSIS — F209 Schizophrenia, unspecified: Secondary | ICD-10-CM | POA: Diagnosis present

## 2016-11-09 DIAGNOSIS — I10 Essential (primary) hypertension: Secondary | ICD-10-CM | POA: Diagnosis not present

## 2016-11-09 DIAGNOSIS — K5900 Constipation, unspecified: Secondary | ICD-10-CM

## 2016-11-09 DIAGNOSIS — E860 Dehydration: Secondary | ICD-10-CM | POA: Diagnosis present

## 2016-11-09 DIAGNOSIS — E785 Hyperlipidemia, unspecified: Secondary | ICD-10-CM | POA: Diagnosis present

## 2016-11-09 DIAGNOSIS — E871 Hypo-osmolality and hyponatremia: Secondary | ICD-10-CM | POA: Diagnosis not present

## 2016-11-09 DIAGNOSIS — E7800 Pure hypercholesterolemia, unspecified: Secondary | ICD-10-CM | POA: Diagnosis not present

## 2016-11-09 DIAGNOSIS — R112 Nausea with vomiting, unspecified: Secondary | ICD-10-CM

## 2016-11-09 DIAGNOSIS — N390 Urinary tract infection, site not specified: Secondary | ICD-10-CM | POA: Diagnosis not present

## 2016-11-09 DIAGNOSIS — R109 Unspecified abdominal pain: Secondary | ICD-10-CM | POA: Diagnosis not present

## 2016-11-09 DIAGNOSIS — F319 Bipolar disorder, unspecified: Secondary | ICD-10-CM | POA: Diagnosis not present

## 2016-11-09 DIAGNOSIS — R002 Palpitations: Secondary | ICD-10-CM | POA: Diagnosis not present

## 2016-11-09 DIAGNOSIS — Z7984 Long term (current) use of oral hypoglycemic drugs: Secondary | ICD-10-CM

## 2016-11-09 DIAGNOSIS — R339 Retention of urine, unspecified: Secondary | ICD-10-CM | POA: Diagnosis present

## 2016-11-09 DIAGNOSIS — K219 Gastro-esophageal reflux disease without esophagitis: Secondary | ICD-10-CM | POA: Diagnosis present

## 2016-11-09 DIAGNOSIS — Z79899 Other long term (current) drug therapy: Secondary | ICD-10-CM

## 2016-11-09 DIAGNOSIS — N289 Disorder of kidney and ureter, unspecified: Secondary | ICD-10-CM

## 2016-11-09 HISTORY — DX: Cardiac arrhythmia, unspecified: I49.9

## 2016-11-09 LAB — COMPREHENSIVE METABOLIC PANEL
ALBUMIN: 3.4 g/dL — AB (ref 3.5–5.0)
ALT: 12 U/L — ABNORMAL LOW (ref 14–54)
AST: 22 U/L (ref 15–41)
Alkaline Phosphatase: 57 U/L (ref 38–126)
Anion gap: 16 — ABNORMAL HIGH (ref 5–15)
BUN: 15 mg/dL (ref 6–20)
CHLORIDE: 88 mmol/L — AB (ref 101–111)
CO2: 19 mmol/L — ABNORMAL LOW (ref 22–32)
Calcium: 9 mg/dL (ref 8.9–10.3)
Creatinine, Ser: 1.37 mg/dL — ABNORMAL HIGH (ref 0.44–1.00)
GFR calc Af Amer: 43 mL/min — ABNORMAL LOW (ref >60)
GFR calc non Af Amer: 37 mL/min — ABNORMAL LOW (ref >60)
GLUCOSE: 95 mg/dL (ref 65–99)
POTASSIUM: 3.9 mmol/L (ref 3.5–5.1)
Sodium: 123 mmol/L — ABNORMAL LOW (ref 135–145)
Total Bilirubin: 0.5 mg/dL (ref 0.3–1.2)
Total Protein: 7.4 g/dL (ref 6.5–8.1)

## 2016-11-09 LAB — URINALYSIS, ROUTINE W REFLEX MICROSCOPIC
BILIRUBIN URINE: NEGATIVE
Glucose, UA: NEGATIVE mg/dL
HGB URINE DIPSTICK: NEGATIVE
KETONES UR: NEGATIVE mg/dL
NITRITE: NEGATIVE
PH: 6 (ref 5.0–8.0)
Protein, ur: NEGATIVE mg/dL
Specific Gravity, Urine: 1.006 (ref 1.005–1.030)

## 2016-11-09 LAB — CBC WITH DIFFERENTIAL/PLATELET
BASOS ABS: 0 10*3/uL (ref 0.0–0.1)
BASOS PCT: 0 %
EOS ABS: 0.2 10*3/uL (ref 0.0–0.7)
EOS PCT: 1 %
HCT: 29.1 % — ABNORMAL LOW (ref 36.0–46.0)
Hemoglobin: 10 g/dL — ABNORMAL LOW (ref 12.0–15.0)
Lymphocytes Relative: 22 %
Lymphs Abs: 2.6 10*3/uL (ref 0.7–4.0)
MCH: 28.4 pg (ref 26.0–34.0)
MCHC: 34.4 g/dL (ref 30.0–36.0)
MCV: 82.7 fL (ref 78.0–100.0)
MONO ABS: 1.8 10*3/uL — AB (ref 0.1–1.0)
Monocytes Relative: 16 %
Neutro Abs: 7.2 10*3/uL (ref 1.7–7.7)
Neutrophils Relative %: 61 %
PLATELETS: 204 10*3/uL (ref 150–400)
RBC: 3.52 MIL/uL — ABNORMAL LOW (ref 3.87–5.11)
RDW: 13.9 % (ref 11.5–15.5)
WBC: 11.8 10*3/uL — ABNORMAL HIGH (ref 4.0–10.5)

## 2016-11-09 LAB — TSH: TSH: 4.075 u[IU]/mL (ref 0.350–4.500)

## 2016-11-09 LAB — TROPONIN I

## 2016-11-09 MED ORDER — METOPROLOL TARTRATE 5 MG/5ML IV SOLN
5.0000 mg | Freq: Once | INTRAVENOUS | Status: AC
  Administered 2016-11-09: 5 mg via INTRAVENOUS
  Filled 2016-11-09: qty 5

## 2016-11-09 MED ORDER — DILTIAZEM HCL 25 MG/5ML IV SOLN
20.0000 mg | Freq: Once | INTRAVENOUS | Status: AC
  Administered 2016-11-09: 20 mg via INTRAVENOUS
  Filled 2016-11-09: qty 5

## 2016-11-09 MED ORDER — SODIUM CHLORIDE 0.9 % IV BOLUS (SEPSIS)
1000.0000 mL | Freq: Once | INTRAVENOUS | Status: AC
  Administered 2016-11-09: 1000 mL via INTRAVENOUS

## 2016-11-09 MED ORDER — SODIUM CHLORIDE 0.9 % IV SOLN
INTRAVENOUS | Status: DC
  Administered 2016-11-10 (×2): via INTRAVENOUS

## 2016-11-09 MED ORDER — DEXTROSE 5 % IV SOLN
1.0000 g | Freq: Once | INTRAVENOUS | Status: AC
  Administered 2016-11-10: 1 g via INTRAVENOUS
  Filled 2016-11-09: qty 10

## 2016-11-09 MED ORDER — METOPROLOL TARTRATE 25 MG PO TABS: 25.0000 mg | ORAL_TABLET | Freq: Two times a day (BID) | ORAL | 0 refills | Status: DC

## 2016-11-09 MED ORDER — IOPAMIDOL (ISOVUE-300) INJECTION 61%
75.0000 mL | Freq: Once | INTRAVENOUS | Status: AC | PRN
Start: 2016-11-09 — End: 2016-11-09
  Administered 2016-11-09: 75 mL via INTRAVENOUS

## 2016-11-09 MED ORDER — ONDANSETRON HCL 4 MG/2ML IJ SOLN
4.0000 mg | Freq: Once | INTRAMUSCULAR | Status: AC
  Administered 2016-11-09: 4 mg via INTRAVENOUS
  Filled 2016-11-09: qty 2

## 2016-11-09 NOTE — ED Triage Notes (Signed)
Pt c/o tongue and lips swelling that started today, c/o feeling like her heartbeat and blood pressure are elevated for the past month becoming worse tonight and abd pain

## 2016-11-09 NOTE — ED Provider Notes (Signed)
2200:  Pt received at sign out with UA and CT A/P pending. Pt's caregiver brought pt to ED for "high HR."  Pt c/o increasing abd pain, now has developed N/V while in the ED. Pt received IV cardizem and IV lopressor x2 with improvement of HR from 140's to 110's. H/H per baseline. BUN/Cr elevated from baseline. New hyponatremia on labs. +UTI, UC pending; IV rocephin given. CT below. Pt urinated spontaneously after CT scan; bladder scan still reading "999+" per ED RN.  Will place foley, obtain I&O's admit. 0020: T/C to [facility] Conley Rolls, case discussed, including:  HPI, pertinent PM/SHx, VS/PE, dx testing, ED course and treatment:  Agreeable to admit.    Patient Vitals for the past 24 hrs:  BP Temp Temp src Pulse Resp SpO2 Height Weight  11/10/16 0000 139/82 - - (!) 115 (!) 24 94 % - -  11/09/16 2345 - - - (!) 116 (!) 25 94 % - -  11/09/16 2330 (!) 137/99 - - - 17 - - -  11/09/16 2300 (!) 135/93 - - (!) 118 (!) 23 90 % - -  11/09/16 2230 134/87 - - (!) 120 (!) 25 92 % - -  11/09/16 2215 - - - - (!) 27 - - -  11/09/16 2200 123/82 - - 96 17 94 % - -  11/09/16 2132 123/76 - - (!) 112 (!) 29 95 % - -  11/09/16 2130 136/83 - - (!) 123 (!) 29 95 % - -  11/09/16 2042 - - - (!) 122 - 93 % - -  11/09/16 2039 - - - (!) 123 - 95 % - -  11/09/16 2036 - - - (!) 120 - 96 % - -  11/09/16 2035 (!) 147/99 - - (!) 121 (!) 23 95 % - -  11/09/16 2033 - - - (!) 121 - 98 % - -  11/09/16 2027 - - - (!) 131 - 95 % - -  11/09/16 2024 - - - (!) 131 - 93 % - -  11/09/16 2021 - - - (!) 136 - 95 % - -  11/09/16 2018 - - - (!) 136 - 95 % - -  11/09/16 2015 - - - (!) 139 - 94 % - -  11/09/16 1952 - - Oral - - - - -  11/09/16 1951 (!) 144/86 98.4 F (36.9 C) - (!) 140 18 94 % 5' (1.524 m) 90.7 kg (200 lb)     MDM Reviewed: previous chart, nursing note and vitals Reviewed previous: labs Interpretation: labs, x-ray and CT scan Total time providing critical care: 30-74 minutes. This excludes time spent performing separately  reportable procedures and services. Consults: admitting MD   CRITICAL CARE Performed by: Laray Anger Total critical care time: 35 minutes Critical care time was exclusive of separately billable procedures and treating other patients. Critical care was necessary to treat or prevent imminent or life-threatening deterioration. Critical care was time spent personally by me on the following activities: development of treatment plan with patient and/or surrogate as well as nursing, discussions with consultants, evaluation of patient's response to treatment, examination of patient, obtaining history from patient or surrogate, ordering and performing treatments and interventions, ordering and review of laboratory studies, ordering and review of radiographic studies, pulse oximetry and re-evaluation of patient's condition.   Results for orders placed or performed during the hospital encounter of 11/09/16  CBC with Differential  Result Value Ref Range   WBC 11.8 (H) 4.0 -  10.5 K/uL   RBC 3.52 (L) 3.87 - 5.11 MIL/uL   Hemoglobin 10.0 (L) 12.0 - 15.0 g/dL   HCT 40.929.1 (L) 81.136.0 - 91.446.0 %   MCV 82.7 78.0 - 100.0 fL   MCH 28.4 26.0 - 34.0 pg   MCHC 34.4 30.0 - 36.0 g/dL   RDW 78.213.9 95.611.5 - 21.315.5 %   Platelets 204 150 - 400 K/uL   Neutrophils Relative % 61 %   Neutro Abs 7.2 1.7 - 7.7 K/uL   Lymphocytes Relative 22 %   Lymphs Abs 2.6 0.7 - 4.0 K/uL   Monocytes Relative 16 %   Monocytes Absolute 1.8 (H) 0.1 - 1.0 K/uL   Eosinophils Relative 1 %   Eosinophils Absolute 0.2 0.0 - 0.7 K/uL   Basophils Relative 0 %   Basophils Absolute 0.0 0.0 - 0.1 K/uL  Comprehensive metabolic panel  Result Value Ref Range   Sodium 123 (L) 135 - 145 mmol/L   Potassium 3.9 3.5 - 5.1 mmol/L   Chloride 88 (L) 101 - 111 mmol/L   CO2 19 (L) 22 - 32 mmol/L   Glucose, Bld 95 65 - 99 mg/dL   BUN 15 6 - 20 mg/dL   Creatinine, Ser 0.861.37 (H) 0.44 - 1.00 mg/dL   Calcium 9.0 8.9 - 57.810.3 mg/dL   Total Protein 7.4 6.5 - 8.1  g/dL   Albumin 3.4 (L) 3.5 - 5.0 g/dL   AST 22 15 - 41 U/L   ALT 12 (L) 14 - 54 U/L   Alkaline Phosphatase 57 38 - 126 U/L   Total Bilirubin 0.5 0.3 - 1.2 mg/dL   GFR calc non Af Amer 37 (L) >60 mL/min   GFR calc Af Amer 43 (L) >60 mL/min   Anion gap 16 (H) 5 - 15  Troponin I  Result Value Ref Range   Troponin I <0.03 <0.03 ng/mL  Urinalysis, Routine w reflex microscopic  Result Value Ref Range   Color, Urine YELLOW YELLOW   APPearance CLOUDY (A) CLEAR   Specific Gravity, Urine 1.006 1.005 - 1.030   pH 6.0 5.0 - 8.0   Glucose, UA NEGATIVE NEGATIVE mg/dL   Hgb urine dipstick NEGATIVE NEGATIVE   Bilirubin Urine NEGATIVE NEGATIVE   Ketones, ur NEGATIVE NEGATIVE mg/dL   Protein, ur NEGATIVE NEGATIVE mg/dL   Nitrite NEGATIVE NEGATIVE   Leukocytes, UA LARGE (A) NEGATIVE   RBC / HPF 0-5 0 - 5 RBC/hpf   WBC, UA TOO NUMEROUS TO COUNT 0 - 5 WBC/hpf   Bacteria, UA FEW (A) NONE SEEN   Squamous Epithelial / LPF 0-5 (A) NONE SEEN   WBC Clumps PRESENT   TSH  Result Value Ref Range   TSH 4.075 0.350 - 4.500 uIU/mL   Dg Chest 2 View Result Date: 11/09/2016 CLINICAL DATA:  Cardiac palpitations EXAM: CHEST  2 VIEW COMPARISON:  11/03/2015 FINDINGS: Cardiac shadow is enlarged. The lungs are well aerated bilaterally. No focal infiltrate or sizable effusion is seen. Degenerative change of thoracic spine is noted. IMPRESSION: No acute abnormality seen. Electronically Signed   By: Alcide CleverMark  Lukens M.D.   On: 11/09/2016 21:23   Ct Abdomen Pelvis W Contrast Result Date: 11/09/2016 CLINICAL DATA:  Acute onset of generalized abdominal pain and constipation. Initial encounter. EXAM: CT ABDOMEN AND PELVIS WITH CONTRAST TECHNIQUE: Multidetector CT imaging of the abdomen and pelvis was performed using the standard protocol following bolus administration of intravenous contrast. CONTRAST:  75mL ISOVUE-300 IOPAMIDOL (ISOVUE-300) INJECTION 61% COMPARISON:  CT of  the abdomen and pelvis performed 11/03/2015, and renal  ultrasound performed 11/04/2015 FINDINGS: Lower chest: The visualized lung bases are grossly clear. The visualized portions of the mediastinum are unremarkable. Hepatobiliary: The liver is unremarkable in appearance. The gallbladder is unremarkable in appearance. The common bile duct remains normal in caliber. Pancreas: The pancreas is within normal limits. Spleen: The spleen is unremarkable in appearance. Adrenals/Urinary Tract: The adrenal glands are unremarkable in appearance. There is mild right-sided and minimal left-sided hydronephrosis, which appears to reflect underlying marked bladder distention. No renal or ureteral stones are identified. Mild nonspecific perinephric stranding is noted bilaterally. Stomach/Bowel: The stomach is unremarkable in appearance. The small bowel is within normal limits. The appendix is normal in caliber, without evidence of appendicitis. The colon is contains a small amount of stool and is unremarkable in appearance, without evidence of significant constipation. Vascular/Lymphatic: Scattered calcification is seen along the abdominal aorta and its branches. The abdominal aorta is otherwise grossly unremarkable. The inferior vena cava is grossly unremarkable. No retroperitoneal lymphadenopathy is seen. No pelvic sidewall lymphadenopathy is identified. Reproductive: The uterus is unremarkable in appearance. The ovaries are not well characterized. No suspicious adnexal masses are seen. The bladder is markedly distended and grossly unremarkable in appearance. Other: No additional soft tissue abnormalities are seen. Musculoskeletal: No acute osseous abnormalities are identified. Multilevel vacuum phenomenon is noted along the lower thoracic and lumbar spine. The visualized musculature is unremarkable in appearance. IMPRESSION: 1. Mild right-sided and minimal left-sided hydronephrosis, which appears to reflect underlying marked bladder distention. No obstructing stone seen. 2. Colon  contains a small amount of stool and is unremarkable in appearance, without evidence of significant constipation. 3. Scattered aortic atherosclerosis. Electronically Signed   By: Roanna Raider M.D.   On: 11/09/2016 23:30   Dg Abd 2 Views Result Date: 11/09/2016 CLINICAL DATA:  Abdominal pain EXAM: ABDOMEN - 2 VIEW COMPARISON:  11/08/2015 FINDINGS: Scattered large and small bowel gas is noted. No definitive obstructive changes are seen. This likely represents a generalized ileus. No free air is noted. No acute bony abnormality is noted. IMPRESSION: Scattered large and small bowel gas likely representing a generalized ileus. No definitive obstructive changes are seen. Electronically Signed   By: Alcide Clever M.D.   On: 11/09/2016 21:24    Results for STATIA, BURDICK (MRN [id number]) as of 11/09/2016 23:59  Ref. Range 11/05/2015 06:26 11/06/2015 06:07 11/07/2015 05:09 11/09/2015 05:59 11/09/2016 20:15  Sodium Latest Ref Range: 135 - 145 mmol/L 134 (L) 138 135 136 123 (L)  BUN Latest Ref Range: 6 - 20 mg/dL 26 (H) 16 10 14 15   Creatinine Latest Ref Range: 0.44 - 1.00 mg/dL 4.09 (H) 8.11 (H) 9.14 (H) 1.28 (H) 1.37 Rexene Edison)     Samuel Jester, DO 11/10/16 [id number]

## 2016-11-09 NOTE — ED Provider Notes (Signed)
[facility] DEPT Provider Note   CSN: [id number] Arrival date & time: 11/09/16  1941     History   Chief Complaint Chief Complaint  Patient presents with  . Facial Swelling  . Hypertension    HPI Maria Pace is a 76 y.o. female.  Pt presents to the ED today with high blood pressure and high heart rate.  The pt's care giver said her bp has been climbing for the past month.  Pt denies any cp or sob.  She does have some abdominal pain and some constipation, but she had a bm today.      Past Medical History:  Diagnosis Date  . Bipolar 1 disorder (HCC)   . Diabetes mellitus without complication (HCC)   . GERD (gastroesophageal reflux disease)   . High cholesterol   . Hypertension   . Irregular heart beat   . Schizophrenia [facility])     Patient Active Problem List   Diagnosis Date Noted  . SBO (small bowel obstruction) (HCC) 11/03/2015    Past Surgical History:  Procedure Laterality Date  . YAG LASER APPLICATION Left 01/06/2015   Procedure: YAG LASER APPLICATION;  Surgeon: Jethro Bolus, MD;  Location: [facility] ORS;  Service: Ophthalmology;  Laterality: Left;    OB History    No data available       Home Medications    Prior to Admission medications   Medication Sig Start Date End Date Taking? Authorizing Provider  acetaminophen (TYLENOL) 500 MG tablet Take 500 mg by mouth every 8 (eight) hours as needed for mild pain or moderate pain.   Yes [provider]  albuterol (PROAIR HFA) 108 (90 Base) MCG/ACT inhaler Inhale 2 puffs into the lungs every 6 (six) hours as needed for wheezing or shortness of breath.   Yes [provider]  atorvastatin (LIPITOR) 10 MG tablet Take 10 mg by mouth daily.   Yes [provider]  benztropine (COGENTIN) 1 MG tablet Take 1 mg by mouth 2 (two) times daily.   Yes [provider]  Camphor-Eucalyptus-Menthol (VICKS VAPORUB) 4.7-1.2-2.6 % OINT Apply 1 application topically daily. Apply once to toenails  daily   Yes [provider]  diclofenac sodium (VOLTAREN) 1 % GEL Apply 2 g topically 2 (two) times daily as needed.   Yes [provider]  docusate sodium (COLACE) 100 MG capsule Take 200 mg by mouth daily.   Yes [provider]  hydrochlorothiazide (MICROZIDE) 12.5 MG capsule Take 12.5 mg by mouth daily.   Yes [provider]  latanoprost (XALATAN) 0.005 % ophthalmic solution Place 1 drop into both eyes at bedtime.   Yes [provider]  linagliptin (TRADJENTA) 5 MG TABS tablet Take 5 mg by mouth daily.   Yes [provider]  lisinopril (PRINIVIL,ZESTRIL) 20 MG tablet Take 20 mg by mouth daily.    Yes [provider]  loratadine (CLARITIN) 10 MG tablet Take 10 mg by mouth daily.   Yes [provider]  metFORMIN (GLUCOPHAGE) 1000 MG tablet Take 1,000 mg by mouth 2 (two) times daily with a meal.   Yes [provider]  nystatin cream (MYCOSTATIN) Apply 1 application topically 2 (two) times daily as needed for dry skin. Apply to the affected external area(s) twice a day before applying triamcinolone cream   Yes [provider]  OLANZapine (ZYPREXA) 20 MG tablet Take 20 mg by mouth at bedtime.   Yes [provider]  pantoprazole (PROTONIX) 40 MG tablet Take 40 mg  by mouth daily.   Yes [provider]  polyethylene glycol powder (GLYCOLAX/MIRALAX) powder Take 17 g by mouth daily as needed for mild constipation or moderate constipation.   Yes [provider]  risperiDONE (RISPERDAL) 2 MG tablet Take 2 mg by mouth 2 (two) times daily.   Yes [provider]  traZODone (DESYREL) 50 MG tablet Take 50 mg by mouth at bedtime.   Yes [provider]  triamcinolone cream (KENALOG) 0.1 % Apply 1 application topically 2 (two) times daily as needed. Apply to the affected external area(s) twice a day as needed - apply after the nystatin cream   Yes [provider]  Valproate  Sodium (VALPROIC ACID) 250 MG/5ML SOLN Take 5 mLs by mouth 3 (three) times daily with meals.   Yes [provider]  metoprolol tartrate (LOPRESSOR) 25 MG tablet Take 1 tablet (25 mg total) by mouth 2 (two) times daily. 11/09/16   Jacalyn LefevreHaviland, Liyana Suniga, MD    Family History No family history on file.  Social History Social History  Substance Use Topics  . Smoking status: Never Smoker  . Smokeless tobacco: Never Used  . Alcohol use No     Allergies   Haloperidol lactate   Review of Systems Review of Systems  Cardiovascular: Positive for palpitations.  Gastrointestinal: Positive for abdominal pain and constipation.  All other systems reviewed and are negative.    Physical Exam Updated Vital Signs BP 123/76   Pulse (!) 112   Temp 98.4 F (36.9 C)   Resp (!) 29   Ht 5' (1.524 m)   Wt 90.7 kg (200 lb)   SpO2 95%   BMI 39.06 kg/m   Physical Exam  Constitutional: She is oriented to person, place, and time. She appears well-developed and well-nourished.  HENT:  Head: Normocephalic and atraumatic.  Right Ear: External ear normal.  Left Ear: External ear normal.  Nose: Nose normal.  Mouth/Throat: Oropharynx is clear and moist.  Eyes: Pupils are equal, round, and reactive to light. Conjunctivae and EOM are normal.  Neck: Normal range of motion. Neck supple.  Cardiovascular: Regular rhythm, normal heart sounds and intact distal pulses.  Tachycardia present.   Pulmonary/Chest: Effort normal and breath sounds normal.  Abdominal: Soft. Bowel sounds are normal.  Musculoskeletal: Normal range of motion.  Neurological: She is alert and oriented to person, place, and time.  Skin: Skin is warm.  Psychiatric: She has a normal mood and affect. Her behavior is normal. Judgment and thought content normal.  Nursing note and vitals reviewed.    ED Treatments / Results  Labs (all labs ordered are listed, but only abnormal results are displayed) Labs Reviewed  CBC WITH  DIFFERENTIAL/PLATELET - Abnormal; Notable for the following:       Result Value   WBC 11.8 (*)    RBC 3.52 (*)    Hemoglobin 10.0 (*)    HCT 29.1 (*)    Monocytes Absolute 1.8 (*)    All other components within normal limits  COMPREHENSIVE METABOLIC PANEL - Abnormal; Notable for the following:    Sodium 123 (*)    Chloride 88 (*)    CO2 19 (*)    Creatinine, Ser 1.37 (*)    Albumin 3.4 (*)    ALT 12 (*)    GFR calc non Af Amer 37 (*)    GFR calc Af Amer 43 (*)    Anion gap 16 (*)    All other components within normal limits  TROPONIN I  TSH  URINALYSIS, ROUTINE W REFLEX MICROSCOPIC    EKG  EKG Interpretation  Date/Time:  Wednesday November 09 2016 21:36:56 EDT Ventricular Rate:  102 PR Interval:    QRS Duration: 93 QT Interval:  349 QTC Calculation: 455 R Axis:   -3 Text Interpretation:  sinus tachy. pt is tremulous Confirmed by Jacalyn Lefevre [id number] on 11/09/2016 10:02:19 PM       Radiology Dg Chest 2 View  Result Date: 11/09/2016 CLINICAL DATA:  Cardiac palpitations EXAM: CHEST  2 VIEW COMPARISON:  11/03/2015 FINDINGS: Cardiac shadow is enlarged. The lungs are well aerated bilaterally. No focal infiltrate or sizable effusion is seen. Degenerative change of thoracic spine is noted. IMPRESSION: No acute abnormality seen. Electronically Signed   By: Alcide Clever M.D.   On: 11/09/2016 21:23   Dg Abd 2 Views  Result Date: 11/09/2016 CLINICAL DATA:  Abdominal pain EXAM: ABDOMEN - 2 VIEW COMPARISON:  11/08/2015 FINDINGS: Scattered large and small bowel gas is noted. No definitive obstructive changes are seen. This likely represents a generalized ileus. No free air is noted. No acute bony abnormality is noted. IMPRESSION: Scattered large and small bowel gas likely representing a generalized ileus. No definitive obstructive changes are seen. Electronically Signed   By: Alcide Clever M.D.   On: 11/09/2016 21:24    Procedures Procedures (including critical care  time)  Medications Ordered in ED Medications  ondansetron (ZOFRAN) injection 4 mg (not administered)  sodium chloride 0.9 % bolus 1,000 mL (1,000 mLs Intravenous New Bag/Given 11/09/16 2022)  metoprolol tartrate (LOPRESSOR) injection 5 mg (5 mg Intravenous Given 11/09/16 2021)  metoprolol tartrate (LOPRESSOR) injection 5 mg (5 mg Intravenous Given 11/09/16 2042)  diltiazem (CARDIZEM) injection 20 mg (20 mg Intravenous Given 11/09/16 2128)     Initial Impression / Assessment and Plan / ED Course  I have reviewed the triage vital signs and the nursing notes.  Pertinent labs & imaging results that were available during my care of the patient were reviewed by me and considered in my medical decision making (see chart for details).   HR and bp better with 10 mg lopressor and 20 mg cardizem.  I will stop hctz to see if that will help sodium.  I will add lopressor to help with hr and bp.  Her pcp will need to recheck sodium in 1 week.  Pt started vomiting and c/o more abd pain, so I added on a ct abd/pelvis.  This is pending at shift change.  Pt signed out to Dr. Richrd Prime.    Final Clinical Impressions(s) / ED Diagnoses   Final diagnoses:  Sinus tachycardia  Hyponatremia  Essential hypertension    New Prescriptions New Prescriptions   METOPROLOL TARTRATE (LOPRESSOR) 25 MG TABLET    Take 1 tablet (25 mg total) by mouth 2 (two) times daily.     Jacalyn Lefevre, MD 11/09/16 2215

## 2016-11-09 NOTE — Discharge Instructions (Signed)
Stop hydrochlorothiazide.  Your doctor needs to recheck your sodium in 1 week.

## 2016-11-09 NOTE — ED Notes (Signed)
Pt attempted to provide urine sample with use of female urinal but was unable to at this time. RN Georgann HousekeeperMichael Doss notified

## 2016-11-09 NOTE — ED Notes (Signed)
Patient called to state that she had to urinate, but upon entering room patient states that she had a bowel movement on herself.  Cleaned patient with assistance from the nurse Casimiro NeedleMichael.

## 2016-11-10 ENCOUNTER — Encounter (HOSPITAL_COMMUNITY): Payer: Self-pay | Admitting: Internal Medicine

## 2016-11-10 DIAGNOSIS — I10 Essential (primary) hypertension: Secondary | ICD-10-CM | POA: Diagnosis not present

## 2016-11-10 DIAGNOSIS — E871 Hypo-osmolality and hyponatremia: Secondary | ICD-10-CM | POA: Diagnosis present

## 2016-11-10 DIAGNOSIS — E119 Type 2 diabetes mellitus without complications: Secondary | ICD-10-CM | POA: Diagnosis present

## 2016-11-10 DIAGNOSIS — R339 Retention of urine, unspecified: Secondary | ICD-10-CM | POA: Diagnosis not present

## 2016-11-10 DIAGNOSIS — E860 Dehydration: Secondary | ICD-10-CM | POA: Diagnosis present

## 2016-11-10 DIAGNOSIS — E785 Hyperlipidemia, unspecified: Secondary | ICD-10-CM | POA: Diagnosis present

## 2016-11-10 DIAGNOSIS — Z7984 Long term (current) use of oral hypoglycemic drugs: Secondary | ICD-10-CM | POA: Diagnosis not present

## 2016-11-10 DIAGNOSIS — F319 Bipolar disorder, unspecified: Secondary | ICD-10-CM | POA: Diagnosis present

## 2016-11-10 DIAGNOSIS — E1165 Type 2 diabetes mellitus with hyperglycemia: Secondary | ICD-10-CM | POA: Diagnosis not present

## 2016-11-10 DIAGNOSIS — N390 Urinary tract infection, site not specified: Secondary | ICD-10-CM | POA: Diagnosis present

## 2016-11-10 DIAGNOSIS — K219 Gastro-esophageal reflux disease without esophagitis: Secondary | ICD-10-CM | POA: Diagnosis present

## 2016-11-10 DIAGNOSIS — R109 Unspecified abdominal pain: Secondary | ICD-10-CM | POA: Diagnosis not present

## 2016-11-10 DIAGNOSIS — E7800 Pure hypercholesterolemia, unspecified: Secondary | ICD-10-CM | POA: Diagnosis present

## 2016-11-10 DIAGNOSIS — F203 Undifferentiated schizophrenia: Secondary | ICD-10-CM | POA: Diagnosis not present

## 2016-11-10 DIAGNOSIS — F209 Schizophrenia, unspecified: Secondary | ICD-10-CM | POA: Diagnosis present

## 2016-11-10 DIAGNOSIS — Z79899 Other long term (current) drug therapy: Secondary | ICD-10-CM | POA: Diagnosis not present

## 2016-11-10 LAB — GLUCOSE, CAPILLARY
GLUCOSE-CAPILLARY: 143 mg/dL — AB (ref 65–99)
Glucose-Capillary: 100 mg/dL — ABNORMAL HIGH (ref 65–99)
Glucose-Capillary: 119 mg/dL — ABNORMAL HIGH (ref 65–99)
Glucose-Capillary: 126 mg/dL — ABNORMAL HIGH (ref 65–99)

## 2016-11-10 LAB — TSH: TSH: 2.084 u[IU]/mL (ref 0.350–4.500)

## 2016-11-10 MED ORDER — RISPERIDONE 1 MG PO TABS
1.0000 mg | ORAL_TABLET | Freq: Two times a day (BID) | ORAL | Status: DC
  Administered 2016-11-10 – 2016-11-14 (×9): 1 mg via ORAL
  Filled 2016-11-10 (×10): qty 1

## 2016-11-10 MED ORDER — ACETAMINOPHEN 500 MG PO TABS: 500.0000 mg | ORAL_TABLET | Freq: Three times a day (TID) | ORAL | Status: DC | PRN

## 2016-11-10 MED ORDER — ONDANSETRON HCL 4 MG PO TABS: 4.0000 mg | ORAL_TABLET | Freq: Four times a day (QID) | ORAL | Status: DC | PRN

## 2016-11-10 MED ORDER — POLYETHYLENE GLYCOL 3350 17 G PO PACK: 17.0000 g | PACK | Freq: Every day | ORAL | Status: DC | PRN

## 2016-11-10 MED ORDER — PANTOPRAZOLE SODIUM 40 MG PO TBEC
40.0000 mg | DELAYED_RELEASE_TABLET | Freq: Every day | ORAL | Status: DC
  Administered 2016-11-10 – 2016-11-14 (×5): 40 mg via ORAL
  Filled 2016-11-10 (×5): qty 1

## 2016-11-10 MED ORDER — VALPROATE SODIUM 250 MG/5ML PO SOLN
250.0000 mg | Freq: Three times a day (TID) | ORAL | Status: DC
  Administered 2016-11-10 – 2016-11-14 (×11): 250 mg via ORAL
  Filled 2016-11-10 (×20): qty 5

## 2016-11-10 MED ORDER — LISINOPRIL 10 MG PO TABS
20.0000 mg | ORAL_TABLET | Freq: Every day | ORAL | Status: DC
  Administered 2016-11-10 – 2016-11-14 (×5): 20 mg via ORAL
  Filled 2016-11-10 (×5): qty 2

## 2016-11-10 MED ORDER — INSULIN ASPART 100 UNIT/ML ~~LOC~~ SOLN
0.0000 [IU] | Freq: Three times a day (TID) | SUBCUTANEOUS | Status: DC
  Administered 2016-11-10: 1 [IU] via SUBCUTANEOUS
  Administered 2016-11-11: 2 [IU] via SUBCUTANEOUS
  Administered 2016-11-12: 1 [IU] via SUBCUTANEOUS
  Administered 2016-11-13: 2 [IU] via SUBCUTANEOUS
  Administered 2016-11-14: 1 [IU] via SUBCUTANEOUS

## 2016-11-10 MED ORDER — ALBUTEROL SULFATE (2.5 MG/3ML) 0.083% IN NEBU: 3.0000 mL | INHALATION_SOLUTION | Freq: Four times a day (QID) | RESPIRATORY_TRACT | Status: DC | PRN

## 2016-11-10 MED ORDER — BENZTROPINE MESYLATE 1 MG PO TABS
1.0000 mg | ORAL_TABLET | Freq: Every day | ORAL | Status: DC
  Administered 2016-11-10 – 2016-11-14 (×5): 1 mg via ORAL
  Filled 2016-11-10 (×5): qty 1

## 2016-11-10 MED ORDER — DEXTROSE 5 % IV SOLN
1.0000 g | INTRAVENOUS | Status: DC
  Administered 2016-11-11 – 2016-11-14 (×4): 1 g via INTRAVENOUS
  Filled 2016-11-10 (×5): qty 10

## 2016-11-10 MED ORDER — OLANZAPINE 5 MG PO TABS
20.0000 mg | ORAL_TABLET | Freq: Every day | ORAL | Status: DC
  Administered 2016-11-10 – 2016-11-13 (×4): 20 mg via ORAL
  Filled 2016-11-10 (×4): qty 4

## 2016-11-10 MED ORDER — POLYETHYLENE GLYCOL 3350 17 GM/SCOOP PO POWD
17.0000 g | Freq: Every day | ORAL | Status: DC | PRN
  Filled 2016-11-10: qty 255

## 2016-11-10 MED ORDER — LORATADINE 10 MG PO TABS
10.0000 mg | ORAL_TABLET | Freq: Every day | ORAL | Status: DC
  Administered 2016-11-10 – 2016-11-14 (×5): 10 mg via ORAL
  Filled 2016-11-10 (×5): qty 1

## 2016-11-10 MED ORDER — DOCUSATE SODIUM 100 MG PO CAPS
200.0000 mg | ORAL_CAPSULE | Freq: Every day | ORAL | Status: DC
  Administered 2016-11-10 – 2016-11-14 (×5): 200 mg via ORAL
  Filled 2016-11-10 (×5): qty 2

## 2016-11-10 MED ORDER — ATORVASTATIN CALCIUM 10 MG PO TABS
10.0000 mg | ORAL_TABLET | Freq: Every day | ORAL | Status: DC
  Administered 2016-11-10 – 2016-11-14 (×5): 10 mg via ORAL
  Filled 2016-11-10 (×5): qty 1

## 2016-11-10 MED ORDER — TRAZODONE HCL 50 MG PO TABS
50.0000 mg | ORAL_TABLET | Freq: Every day | ORAL | Status: DC
  Administered 2016-11-10 – 2016-11-13 (×4): 50 mg via ORAL
  Filled 2016-11-10 (×4): qty 1

## 2016-11-10 MED ORDER — LATANOPROST 0.005 % OP SOLN
1.0000 [drp] | Freq: Every day | OPHTHALMIC | Status: DC
  Administered 2016-11-10 – 2016-11-13 (×4): 1 [drp] via OPHTHALMIC
  Filled 2016-11-10: qty 2.5

## 2016-11-10 MED ORDER — INSULIN ASPART 100 UNIT/ML ~~LOC~~ SOLN: 0.0000 [IU] | Freq: Every day | SUBCUTANEOUS | Status: DC

## 2016-11-10 MED ORDER — LINAGLIPTIN 5 MG PO TABS
5.0000 mg | ORAL_TABLET | Freq: Every day | ORAL | Status: DC
  Administered 2016-11-10 – 2016-11-14 (×5): 5 mg via ORAL
  Filled 2016-11-10 (×5): qty 1

## 2016-11-10 MED ORDER — HEPARIN SODIUM (PORCINE) 5000 UNIT/ML IJ SOLN
5000.0000 [IU] | Freq: Three times a day (TID) | INTRAMUSCULAR | Status: DC
  Administered 2016-11-10 – 2016-11-14 (×12): 5000 [IU] via SUBCUTANEOUS
  Filled 2016-11-10 (×13): qty 1

## 2016-11-10 MED ORDER — SODIUM CHLORIDE 0.9% FLUSH
3.0000 mL | Freq: Two times a day (BID) | INTRAVENOUS | Status: DC
  Administered 2016-11-10 – 2016-11-13 (×8): 3 mL via INTRAVENOUS

## 2016-11-10 MED ORDER — ONDANSETRON HCL 4 MG/2ML IJ SOLN: 4.0000 mg | Freq: Four times a day (QID) | INTRAMUSCULAR | Status: DC | PRN

## 2016-11-10 NOTE — Progress Notes (Signed)
Subjective: Patient was admitted due to urinary retention and UTI. Patient was also dehydrated and hyponatremia. She is started on IV fluid and IV antibiotics. She feels better today.  Objective: Vital signs in last 24 hours: Temp:  [98.4 F (36.9 C)] 98.4 F (36.9 C) (08/15 1951) Pulse Rate:  [56-140] 121 (08/16 0444) Resp:  [14-29] 16 (08/16 0444) BP: (93-147)/(55-99) 136/91 (08/16 0444) SpO2:  [90 %-99 %] 99 % (08/16 0444) Weight:  [87 kg (191 lb 12.8 oz)-90.7 kg (200 lb)] 87 kg (191 lb 12.8 oz) (08/16 0444) Weight change:     Intake/Output from previous day: 08/15 0701 - 08/16 0700 In: 1000 [IV Piggyback:1000] Out: 2600 [Urine:2600]  PHYSICAL EXAM General appearance: alert and no distress Resp: clear to auscultation bilaterally Cardio: S1, S2 normal GI: soft, non-tender; bowel sounds normal; no masses,  no organomegaly Extremities: extremities normal, atraumatic, no cyanosis or edema  Lab Results:  Results for orders placed or performed during the hospital encounter of 11/09/16 (from the past 48 hour(s))  Urinalysis, Routine w reflex microscopic     Status: Abnormal   Collection Time: 11/09/16  8:07 PM  Result Value Ref Range   Color, Urine YELLOW YELLOW   APPearance CLOUDY (A) CLEAR   Specific Gravity, Urine 1.006 1.005 - 1.030   pH 6.0 5.0 - 8.0   Glucose, UA NEGATIVE NEGATIVE mg/dL   Hgb urine dipstick NEGATIVE NEGATIVE   Bilirubin Urine NEGATIVE NEGATIVE   Ketones, ur NEGATIVE NEGATIVE mg/dL   Protein, ur NEGATIVE NEGATIVE mg/dL   Nitrite NEGATIVE NEGATIVE   Leukocytes, UA LARGE (A) NEGATIVE   RBC / HPF 0-5 0 - 5 RBC/hpf   WBC, UA TOO NUMEROUS TO COUNT 0 - 5 WBC/hpf   Bacteria, UA FEW (A) NONE SEEN   Squamous Epithelial / LPF 0-5 (A) NONE SEEN   WBC Clumps PRESENT   CBC with Differential     Status: Abnormal   Collection Time: 11/09/16  8:15 PM  Result Value Ref Range   WBC 11.8 (H) 4.0 - 10.5 K/uL   RBC 3.52 (L) 3.87 - 5.11 MIL/uL   Hemoglobin 10.0 (L)  12.0 - 15.0 g/dL   HCT 29.1 (L) 36.0 - 46.0 %   MCV 82.7 78.0 - 100.0 fL   MCH 28.4 26.0 - 34.0 pg   MCHC 34.4 30.0 - 36.0 g/dL   RDW 13.9 11.5 - 15.5 %   Platelets 204 150 - 400 K/uL   Neutrophils Relative % 61 %   Neutro Abs 7.2 1.7 - 7.7 K/uL   Lymphocytes Relative 22 %   Lymphs Abs 2.6 0.7 - 4.0 K/uL   Monocytes Relative 16 %   Monocytes Absolute 1.8 (H) 0.1 - 1.0 K/uL   Eosinophils Relative 1 %   Eosinophils Absolute 0.2 0.0 - 0.7 K/uL   Basophils Relative 0 %   Basophils Absolute 0.0 0.0 - 0.1 K/uL  Comprehensive metabolic panel     Status: Abnormal   Collection Time: 11/09/16  8:15 PM  Result Value Ref Range   Sodium 123 (L) 135 - 145 mmol/L   Potassium 3.9 3.5 - 5.1 mmol/L   Chloride 88 (L) 101 - 111 mmol/L   CO2 19 (L) 22 - 32 mmol/L   Glucose, Bld 95 65 - 99 mg/dL   BUN 15 6 - 20 mg/dL   Creatinine, Ser 1.37 (H) 0.44 - 1.00 mg/dL   Calcium 9.0 8.9 - 10.3 mg/dL   Total Protein 7.4 6.5 - 8.1 g/dL  Albumin 3.4 (L) 3.5 - 5.0 g/dL   AST 22 15 - 41 U/L   ALT 12 (L) 14 - 54 U/L   Alkaline Phosphatase 57 38 - 126 U/L   Total Bilirubin 0.5 0.3 - 1.2 mg/dL   GFR calc non Af Amer 37 (L) >60 mL/min   GFR calc Af Amer 43 (L) >60 mL/min    Comment: (NOTE) The eGFR has been calculated using the CKD EPI equation. This calculation has not been validated in all clinical situations. eGFR's persistently <60 mL/min signify possible Chronic Kidney Disease.    Anion gap 16 (H) 5 - 15  Troponin I     Status: None   Collection Time: 11/09/16  8:15 PM  Result Value Ref Range   Troponin I <0.03 <0.03 ng/mL  TSH     Status: None   Collection Time: 11/09/16  8:15 PM  Result Value Ref Range   TSH 4.075 0.350 - 4.500 uIU/mL    Comment: Performed by a 3rd Generation assay with a functional sensitivity of <=0.01 uIU/mL.  TSH     Status: None   Collection Time: 11/10/16  5:43 AM  Result Value Ref Range   TSH 2.084 0.350 - 4.500 uIU/mL    Comment: Performed by a 3rd Generation assay  with a functional sensitivity of <=0.01 uIU/mL.  Glucose, capillary     Status: Abnormal   Collection Time: 11/10/16  7:22 AM  Result Value Ref Range   Glucose-Capillary 100 (H) 65 - 99 mg/dL   Comment 1 Notify RN    Comment 2 Document in Chart     ABGS No results for input(s): PHART, PO2ART, TCO2, HCO3 in the last 72 hours.  Invalid input(s): PCO2 CULTURES No results found for this or any previous visit (from the past 240 hour(s)). Studies/Results: Dg Chest 2 View  Result Date: 11/09/2016 CLINICAL DATA:  Cardiac palpitations EXAM: CHEST  2 VIEW COMPARISON:  11/03/2015 FINDINGS: Cardiac shadow is enlarged. The lungs are well aerated bilaterally. No focal infiltrate or sizable effusion is seen. Degenerative change of thoracic spine is noted. IMPRESSION: No acute abnormality seen. Electronically Signed   By: Inez Catalina M.D.   On: 11/09/2016 21:23   Ct Abdomen Pelvis W Contrast  Result Date: 11/09/2016 CLINICAL DATA:  Acute onset of generalized abdominal pain and constipation. Initial encounter. EXAM: CT ABDOMEN AND PELVIS WITH CONTRAST TECHNIQUE: Multidetector CT imaging of the abdomen and pelvis was performed using the standard protocol following bolus administration of intravenous contrast. CONTRAST:  32m ISOVUE-300 IOPAMIDOL (ISOVUE-300) INJECTION 61% COMPARISON:  CT of the abdomen and pelvis performed 11/03/2015, and renal ultrasound performed 11/04/2015 FINDINGS: Lower chest: The visualized lung bases are grossly clear. The visualized portions of the mediastinum are unremarkable. Hepatobiliary: The liver is unremarkable in appearance. The gallbladder is unremarkable in appearance. The common bile duct remains normal in caliber. Pancreas: The pancreas is within normal limits. Spleen: The spleen is unremarkable in appearance. Adrenals/Urinary Tract: The adrenal glands are unremarkable in appearance. There is mild right-sided and minimal left-sided hydronephrosis, which appears to reflect  underlying marked bladder distention. No renal or ureteral stones are identified. Mild nonspecific perinephric stranding is noted bilaterally. Stomach/Bowel: The stomach is unremarkable in appearance. The small bowel is within normal limits. The appendix is normal in caliber, without evidence of appendicitis. The colon is contains a small amount of stool and is unremarkable in appearance, without evidence of significant constipation. Vascular/Lymphatic: Scattered calcification is seen along the abdominal aorta and its  branches. The abdominal aorta is otherwise grossly unremarkable. The inferior vena cava is grossly unremarkable. No retroperitoneal lymphadenopathy is seen. No pelvic sidewall lymphadenopathy is identified. Reproductive: The uterus is unremarkable in appearance. The ovaries are not well characterized. No suspicious adnexal masses are seen. The bladder is markedly distended and grossly unremarkable in appearance. Other: No additional soft tissue abnormalities are seen. Musculoskeletal: No acute osseous abnormalities are identified. Multilevel vacuum phenomenon is noted along the lower thoracic and lumbar spine. The visualized musculature is unremarkable in appearance. IMPRESSION: 1. Mild right-sided and minimal left-sided hydronephrosis, which appears to reflect underlying marked bladder distention. No obstructing stone seen. 2. Colon contains a small amount of stool and is unremarkable in appearance, without evidence of significant constipation. 3. Scattered aortic atherosclerosis. Electronically Signed   By: Garald Balding M.D.   On: 11/09/2016 23:30   Dg Abd 2 Views  Result Date: 11/09/2016 CLINICAL DATA:  Abdominal pain EXAM: ABDOMEN - 2 VIEW COMPARISON:  11/08/2015 FINDINGS: Scattered large and small bowel gas is noted. No definitive obstructive changes are seen. This likely represents a generalized ileus. No free air is noted. No acute bony abnormality is noted. IMPRESSION: Scattered large and  small bowel gas likely representing a generalized ileus. No definitive obstructive changes are seen. Electronically Signed   By: Inez Catalina M.D.   On: 11/09/2016 21:24    Medications: I have reviewed the patient's current medications.  Assesment:  Principal Problem:   Urinary retention Active Problems:   Hypertension   Diabetes mellitus without complication (HCC)   High cholesterol   Bipolar 1 disorder (HCC)    Plan:  Medications reviewed Will continue iv antibiotics Continue iv fluids Will do CBC and BMP    LOS: 0 days   Maria Pace 11/10/2016, 8:22 AM

## 2016-11-10 NOTE — H&P (Signed)
History and Physical    BRYAR RENNIE ZOX:[id number] DOB: [birth year] DOA: 11/09/2016  PCP: Avon Gully, MD  Patient coming from: SNF.   Chief Complaint:  Abdominal pain.   HPI: Maria Pace is an 76 y.o. female with hx of schizophrenia, and thus she has been on several psychotropic meds, SNF resident, GERD, HLD, HTN, Bipolar illness, DM2, brought to the ER as she was having increased HR, having abdominal discomfort and some urinary incontinence.  Work up in the ER with abdominal CT showed severely distended bladder, and UA showed TNTC WBC, leukocytosis to 11.8 WBC, Hb of 10 g per dL, and Na of 811.  She has been on diuretics. 1.37. Her LFTs were normal. Foley was placed and over 1000cc returned.  Her HR was originally 140, has improved to 110.  Hospitalist was asked to admit her for acute urinary retention.   ED Course:  See above.  Rewiew of Systems:  Constitutional: Negative for malaise, fever and chills. No significant weight loss or weight gain Eyes: Negative for eye pain, redness and discharge, diplopia, visual changes, or flashes of light. ENMT: Negative for ear pain, hoarseness, nasal congestion, sinus pressure and sore throat. No headaches; tinnitus, drooling, or problem swallowing. Cardiovascular: Negative for chest pain, palpitations, diaphoresis, dyspnea and peripheral edema. ; No orthopnea, PND Respiratory: Negative for cough, hemoptysis, wheezing and stridor. No pleuritic chestpain. Gastrointestinal: Negative for diarrhea, constipation,  melena, blood in stool, hematemesis, jaundice and rectal bleeding.    Genitourinary: Negative for frequency, dysuria, incontinence,flank pain and hematuria; Musculoskeletal: Negative for back pain and neck pain. Negative for swelling and trauma.;  Skin: . Negative for pruritus, rash, abrasions, bruising and skin lesion.; ulcerations Neuro: Negative for headache, lightheadedness and neck stiffness. Negative for weakness, altered level of  consciousness , altered mental status, extremity weakness, burning feet, involuntary movement, seizure and syncope.  Psych: negative for anxiety, depression, insomnia, tearfulness, panic attacks, hallucinations, paranoia, suicidal or homicidal ideation   Past Medical History:  Diagnosis Date  . Bipolar 1 disorder (HCC)   . Diabetes mellitus without complication (HCC)   . GERD (gastroesophageal reflux disease)   . High cholesterol   . Hypertension   . Irregular heart beat   . Schizophrenia [facility])     Past Surgical History:  Procedure Laterality Date  . YAG LASER APPLICATION Left 01/06/2015   Procedure: YAG LASER APPLICATION;  Surgeon: Jethro Bolus, MD;  Location: [facility];  Service: Ophthalmology;  Laterality: Left;     reports that she has never smoked. She has never used smokeless tobacco. She reports that she does not drink alcohol or use drugs.  Allergies  Allergen Reactions  . Haloperidol Lactate Other (See Comments)    Couldn't urinate anymore    History reviewed. No pertinent family history.   Prior to Admission medications   Medication Sig Start Date End Date Taking? Authorizing Provider  acetaminophen (TYLENOL) 500 MG tablet Take 500 mg by mouth every 8 (eight) hours as needed for mild pain or moderate pain.   Yes [provider]  albuterol (PROAIR HFA) 108 (90 Base) MCG/ACT inhaler Inhale 2 puffs into the lungs every 6 (six) hours as needed for wheezing or shortness of breath.   Yes [provider]  atorvastatin (LIPITOR) 10 MG tablet Take 10 mg by mouth daily.   Yes [provider]  benztropine (COGENTIN) 1 MG tablet Take 1 mg by mouth 2 (two) times daily.   Yes [provider]  Camphor-Eucalyptus-Menthol (VICKS VAPORUB)  4.7-1.2-2.6 % OINT Apply 1 application topically daily. Apply once to toenails daily   Yes [provider]  diclofenac sodium (VOLTAREN) 1 % GEL Apply 2 g topically 2 (two) times daily as needed.   Yes  [provider]  docusate sodium (COLACE) 100 MG capsule Take 200 mg by mouth daily.   Yes [provider]  hydrochlorothiazide (MICROZIDE) 12.5 MG capsule Take 12.5 mg by mouth daily.   Yes [provider]  latanoprost (XALATAN) 0.005 % ophthalmic solution Place 1 drop into both eyes at bedtime.   Yes [provider]  linagliptin (TRADJENTA) 5 MG TABS tablet Take 5 mg by mouth daily.   Yes [provider]  lisinopril (PRINIVIL,ZESTRIL) 20 MG tablet Take 20 mg by mouth daily.    Yes [provider]  loratadine (CLARITIN) 10 MG tablet Take 10 mg by mouth daily.   Yes [provider]  metFORMIN (GLUCOPHAGE) 1000 MG tablet Take 1,000 mg by mouth 2 (two) times daily with a meal.   Yes [provider]  nystatin cream (MYCOSTATIN) Apply 1 application topically 2 (two) times daily as needed for dry skin. Apply to the affected external area(s) twice a day before applying triamcinolone cream   Yes [provider]  OLANZapine (ZYPREXA) 20 MG tablet Take 20 mg by mouth at bedtime.   Yes [provider]  pantoprazole (PROTONIX) 40 MG tablet Take 40 mg by mouth daily.   Yes [provider]  polyethylene glycol powder (GLYCOLAX/MIRALAX) powder Take 17 g by mouth daily as needed for mild constipation or moderate constipation.   Yes [provider]  risperiDONE (RISPERDAL) 2 MG tablet Take 2 mg by mouth 2 (two) times daily.   Yes [provider]  traZODone (DESYREL) 50 MG tablet Take 50 mg by mouth at bedtime.   Yes [provider]  triamcinolone cream (KENALOG) 0.1 % Apply 1 application topically 2 (two) times daily as needed. Apply to the affected external area(s) twice a day as needed - apply after the nystatin cream   Yes [provider]  Valproate Sodium (VALPROIC ACID) 250 MG/5ML SOLN Take 5 mLs by mouth 3 (three) times daily with meals.   Yes [provider]    metoprolol tartrate (LOPRESSOR) 25 MG tablet Take 1 tablet (25 mg total) by mouth 2 (two) times daily. 11/09/16   Jacalyn LefevreHaviland, Julie, MD    Physical Exam: Vitals:   11/10/16 0244 11/10/16 0245 11/10/16 0246 11/10/16 0247  BP:      Pulse: (!) 57 (!) 59 (!) 130 (!) 128  Resp:      Temp:      TempSrc:      SpO2: 92% 92% 99% 93%  Weight:      Height:        Constitutional: NAD, calm, comfortable Vitals:   11/10/16 0244 11/10/16 0245 11/10/16 0246 11/10/16 0247  BP:      Pulse: (!) 57 (!) 59 (!) 130 (!) 128  Resp:      Temp:      TempSrc:      SpO2: 92% 92% 99% 93%  Weight:      Height:       Eyes: PERRL, lids and conjunctivae normal ENMT: Mucous membranes are moist. Posterior pharynx clear of any exudate or lesions.Normal dentition.  Neck: normal, supple, no masses, no thyromegaly Respiratory: clear to auscultation bilaterally, no wheezing, no crackles. Normal respiratory effort. No accessory muscle use.  Cardiovascular: Regular rate and  rhythm, no murmurs / rubs / gallops. No extremity edema. 2+ pedal pulses. No carotid bruits.  Abdomen: no tenderness, no masses palpated. No hepatosplenomegaly. Bowel sounds positive.  Musculoskeletal: no clubbing / cyanosis. No joint deformity upper and lower extremities. Good ROM, no contractures. Normal muscle tone.  Skin: no rashes, lesions, ulcers. No induration Neurologic: CN 2-12 grossly intact. Sensation intact, DTR normal. Strength 5/5 in all 4.  Psychiatric: Normal judgment and insight. Alert and oriented x 3. Normal mood.   Labs on Admission: I have personally reviewed following labs and imaging studies CBC:  Recent Labs Lab 11/09/16 2015  WBC 11.8*  NEUTROABS 7.2  HGB 10.0*  HCT 29.1*  MCV 82.7  PLT 204   Basic Metabolic Panel:  Recent Labs Lab 11/09/16 2015  NA 123*  K 3.9  CL 88*  CO2 19*  GLUCOSE 95  BUN 15  CREATININE 1.37*  CALCIUM 9.0   GFR: Estimated Creatinine Clearance: 36.2 mL/min (A) (by C-G formula  based on SCr of 1.37 mg/dL (H)). Liver Function Tests:  Recent Labs Lab 11/09/16 2015  AST 22  ALT 12*  ALKPHOS 57  BILITOT 0.5  PROT 7.4  ALBUMIN 3.4*   Cardiac Enzymes:  Recent Labs Lab 11/09/16 2015  TROPONINI <0.03   Thyroid Function Tests:  Recent Labs  11/09/16 2015  TSH 4.075   Anemia Panel: No results for input(s): VITAMINB12, FOLATE, FERRITIN, TIBC, IRON, RETICCTPCT in the last 72 hours. Urine analysis:    Component Value Date/Time   COLORURINE YELLOW 11/09/2016 2007   APPEARANCEUR CLOUDY (A) 11/09/2016 2007   APPEARANCEUR Clear 10/28/2013 0457   LABSPEC 1.006 11/09/2016 2007   LABSPEC 1.005 10/28/2013 0457   PHURINE 6.0 11/09/2016 2007   GLUCOSEU NEGATIVE 11/09/2016 2007   GLUCOSEU 50 mg/dL 16/12/9602 5409   HGBUR NEGATIVE 11/09/2016 2007   BILIRUBINUR NEGATIVE 11/09/2016 2007   BILIRUBINUR Negative 10/28/2013 0457   KETONESUR NEGATIVE 11/09/2016 2007   PROTEINUR NEGATIVE 11/09/2016 2007   NITRITE NEGATIVE 11/09/2016 2007   LEUKOCYTESUR LARGE (A) 11/09/2016 2007   LEUKOCYTESUR 3+ 10/28/2013 0457   Radiological Exams on Admission: Dg Chest 2 View  Result Date: 11/09/2016 CLINICAL DATA:  Cardiac palpitations EXAM: CHEST  2 VIEW COMPARISON:  11/03/2015 FINDINGS: Cardiac shadow is enlarged. The lungs are well aerated bilaterally. No focal infiltrate or sizable effusion is seen. Degenerative change of thoracic spine is noted. IMPRESSION: No acute abnormality seen. Electronically Signed   By: Alcide Clever M.D.   On: 11/09/2016 21:23   Ct Abdomen Pelvis W Contrast  Result Date: 11/09/2016 CLINICAL DATA:  Acute onset of generalized abdominal pain and constipation. Initial encounter. EXAM: CT ABDOMEN AND PELVIS WITH CONTRAST TECHNIQUE: Multidetector CT imaging of the abdomen and pelvis was performed using the standard protocol following bolus administration of intravenous contrast. CONTRAST:  75mL ISOVUE-300 IOPAMIDOL (ISOVUE-300) INJECTION 61% COMPARISON:  CT  of the abdomen and pelvis performed 11/03/2015, and renal ultrasound performed 11/04/2015 FINDINGS: Lower chest: The visualized lung bases are grossly clear. The visualized portions of the mediastinum are unremarkable. Hepatobiliary: The liver is unremarkable in appearance. The gallbladder is unremarkable in appearance. The common bile duct remains normal in caliber. Pancreas: The pancreas is within normal limits. Spleen: The spleen is unremarkable in appearance. Adrenals/Urinary Tract: The adrenal glands are unremarkable in appearance. There is mild right-sided and minimal left-sided hydronephrosis, which appears to reflect underlying marked bladder distention. No renal or ureteral stones are identified. Mild nonspecific perinephric stranding is noted bilaterally. Stomach/Bowel: The stomach  is unremarkable in appearance. The small bowel is within normal limits. The appendix is normal in caliber, without evidence of appendicitis. The colon is contains a small amount of stool and is unremarkable in appearance, without evidence of significant constipation. Vascular/Lymphatic: Scattered calcification is seen along the abdominal aorta and its branches. The abdominal aorta is otherwise grossly unremarkable. The inferior vena cava is grossly unremarkable. No retroperitoneal lymphadenopathy is seen. No pelvic sidewall lymphadenopathy is identified. Reproductive: The uterus is unremarkable in appearance. The ovaries are not well characterized. No suspicious adnexal masses are seen. The bladder is markedly distended and grossly unremarkable in appearance. Other: No additional soft tissue abnormalities are seen. Musculoskeletal: No acute osseous abnormalities are identified. Multilevel vacuum phenomenon is noted along the lower thoracic and lumbar spine. The visualized musculature is unremarkable in appearance. IMPRESSION: 1. Mild right-sided and minimal left-sided hydronephrosis, which appears to reflect underlying marked  bladder distention. No obstructing stone seen. 2. Colon contains a small amount of stool and is unremarkable in appearance, without evidence of significant constipation. 3. Scattered aortic atherosclerosis. Electronically Signed   By: Roanna Raider M.D.   On: 11/09/2016 23:30   Dg Abd 2 Views  Result Date: 11/09/2016 CLINICAL DATA:  Abdominal pain EXAM: ABDOMEN - 2 VIEW COMPARISON:  11/08/2015 FINDINGS: Scattered large and small bowel gas is noted. No definitive obstructive changes are seen. This likely represents a generalized ileus. No free air is noted. No acute bony abnormality is noted. IMPRESSION: Scattered large and small bowel gas likely representing a generalized ileus. No definitive obstructive changes are seen. Electronically Signed   By: Alcide Clever M.D.   On: 11/09/2016 21:24    EKG: Independently reviewed.   Assessment/Plan Principal Problem:   Urinary retention Active Problems:   Hypertension   Diabetes mellitus without complication (HCC)   High cholesterol   Bipolar 1 disorder (HCC)    PLAN:   Acute urinary retention:  I suspect her urinary retention is due to the anticholinergic effect of her meds.  Will decrease her Cogentin to 1mg  per day.  Will also try to decrease her psych meds a little as well.  Continue with foley, and Tx UTI.  UTI:  Will continue with IV Rocephin.  DM:  Stable.  Metformin will be held for now.   HTN:  Will continue with her meds.    Tachycardia;  Suspect she is volume depleted, and was having pain from her urinary retention.  Place on telemetry.  She is better already.   Hypovolemic hyponatremia:  Will continue with IV normal saline.    DVT prophylaxis: sub Q heparin.  Code Status: FULL CODE.  Family Communication: None at bedside.  Disposition Plan: back to SNF.  Consults called: None. Admission status: OBS.    Brandon Scarbrough MD FACP. [facility]  If 7PM-7AM, please contact night-coverage [email or web address] Password  [email or web address]  11/10/2016, 3:20 AM

## 2016-11-11 LAB — GLUCOSE, CAPILLARY
GLUCOSE-CAPILLARY: 107 mg/dL — AB (ref 65–99)
GLUCOSE-CAPILLARY: 185 mg/dL — AB (ref 65–99)
GLUCOSE-CAPILLARY: 148 mg/dL — AB (ref 65–99)
Glucose-Capillary: 101 mg/dL — ABNORMAL HIGH (ref 65–99)

## 2016-11-11 NOTE — Progress Notes (Signed)
Notified from central telemetry that patient had a few runs of Mobitz II. In pt history, states she has irregular HB but doesn't state what kind. Paged MD, awaiting response. Will continue to monitor.

## 2016-11-11 NOTE — Progress Notes (Signed)
Subjective: Patient feels better. No fever or chills. She has foley catheter in place. She is receiving IV antibiotics.  Objective: Vital signs in last 24 hours: Temp:  [98.2 F (36.8 C)-98.5 F (36.9 C)] 98.2 F (36.8 C) (08/17 0453) Pulse Rate:  [113-125] 113 (08/17 0453) Resp:  [18-20] 18 (08/17 0453) BP: (118-128)/(67-79) 121/72 (08/17 0453) SpO2:  [97 %-98 %] 98 % (08/17 0453) Weight change:     Intake/Output from previous day: 08/16 0701 - 08/17 0700 In: 1730 [I.V.:1680; IV Piggyback:50] Out: 1350 [Urine:1350]  PHYSICAL EXAM General appearance: alert and no distress Resp: clear to auscultation bilaterally Cardio: S1, S2 normal GI: soft, non-tender; bowel sounds normal; no masses,  no organomegaly Extremities: extremities normal, atraumatic, no cyanosis or edema  Lab Results:  Results for orders placed or performed during the hospital encounter of 11/09/16 (from the past 48 hour(s))  Urinalysis, Routine w reflex microscopic     Status: Abnormal   Collection Time: 11/09/16  8:07 PM  Result Value Ref Range   Color, Urine YELLOW YELLOW   APPearance CLOUDY (A) CLEAR   Specific Gravity, Urine 1.006 1.005 - 1.030   pH 6.0 5.0 - 8.0   Glucose, UA NEGATIVE NEGATIVE mg/dL   Hgb urine dipstick NEGATIVE NEGATIVE   Bilirubin Urine NEGATIVE NEGATIVE   Ketones, ur NEGATIVE NEGATIVE mg/dL   Protein, ur NEGATIVE NEGATIVE mg/dL   Nitrite NEGATIVE NEGATIVE   Leukocytes, UA LARGE (A) NEGATIVE   RBC / HPF 0-5 0 - 5 RBC/hpf   WBC, UA TOO NUMEROUS TO COUNT 0 - 5 WBC/hpf   Bacteria, UA FEW (A) NONE SEEN   Squamous Epithelial / LPF 0-5 (A) NONE SEEN   WBC Clumps PRESENT   CBC with Differential     Status: Abnormal   Collection Time: 11/09/16  8:15 PM  Result Value Ref Range   WBC 11.8 (H) 4.0 - 10.5 K/uL   RBC 3.52 (L) 3.87 - 5.11 MIL/uL   Hemoglobin 10.0 (L) 12.0 - 15.0 g/dL   HCT 29.1 (L) 36.0 - 46.0 %   MCV 82.7 78.0 - 100.0 fL   MCH 28.4 26.0 - 34.0 pg   MCHC 34.4 30.0 -  36.0 g/dL   RDW 13.9 11.5 - 15.5 %   Platelets 204 150 - 400 K/uL   Neutrophils Relative % 61 %   Neutro Abs 7.2 1.7 - 7.7 K/uL   Lymphocytes Relative 22 %   Lymphs Abs 2.6 0.7 - 4.0 K/uL   Monocytes Relative 16 %   Monocytes Absolute 1.8 (H) 0.1 - 1.0 K/uL   Eosinophils Relative 1 %   Eosinophils Absolute 0.2 0.0 - 0.7 K/uL   Basophils Relative 0 %   Basophils Absolute 0.0 0.0 - 0.1 K/uL  Comprehensive metabolic panel     Status: Abnormal   Collection Time: 11/09/16  8:15 PM  Result Value Ref Range   Sodium 123 (L) 135 - 145 mmol/L   Potassium 3.9 3.5 - 5.1 mmol/L   Chloride 88 (L) 101 - 111 mmol/L   CO2 19 (L) 22 - 32 mmol/L   Glucose, Bld 95 65 - 99 mg/dL   BUN 15 6 - 20 mg/dL   Creatinine, Ser 1.37 (H) 0.44 - 1.00 mg/dL   Calcium 9.0 8.9 - 10.3 mg/dL   Total Protein 7.4 6.5 - 8.1 g/dL   Albumin 3.4 (L) 3.5 - 5.0 g/dL   AST 22 15 - 41 U/L   ALT 12 (L) 14 - 54 U/L  Alkaline Phosphatase 57 38 - 126 U/L   Total Bilirubin 0.5 0.3 - 1.2 mg/dL   GFR calc non Af Amer 37 (L) >60 mL/min   GFR calc Af Amer 43 (L) >60 mL/min    Comment: (NOTE) The eGFR has been calculated using the CKD EPI equation. This calculation has not been validated in all clinical situations. eGFR's persistently <60 mL/min signify possible Chronic Kidney Disease.    Anion gap 16 (H) 5 - 15  Troponin I     Status: None   Collection Time: 11/09/16  8:15 PM  Result Value Ref Range   Troponin I <0.03 <0.03 ng/mL  TSH     Status: None   Collection Time: 11/09/16  8:15 PM  Result Value Ref Range   TSH 4.075 0.350 - 4.500 uIU/mL    Comment: Performed by a 3rd Generation assay with a functional sensitivity of <=0.01 uIU/mL.  TSH     Status: None   Collection Time: 11/10/16  5:43 AM  Result Value Ref Range   TSH 2.084 0.350 - 4.500 uIU/mL    Comment: Performed by a 3rd Generation assay with a functional sensitivity of <=0.01 uIU/mL.  Glucose, capillary     Status: Abnormal   Collection Time: 11/10/16   7:22 AM  Result Value Ref Range   Glucose-Capillary 100 (H) 65 - 99 mg/dL   Comment 1 Notify RN    Comment 2 Document in Chart   Glucose, capillary     Status: Abnormal   Collection Time: 11/10/16 11:03 AM  Result Value Ref Range   Glucose-Capillary 119 (H) 65 - 99 mg/dL   Comment 1 Notify RN    Comment 2 Document in Chart   Glucose, capillary     Status: Abnormal   Collection Time: 11/10/16  4:37 PM  Result Value Ref Range   Glucose-Capillary 126 (H) 65 - 99 mg/dL   Comment 1 Notify RN    Comment 2 Document in Chart   Glucose, capillary     Status: Abnormal   Collection Time: 11/10/16  8:40 PM  Result Value Ref Range   Glucose-Capillary 143 (H) 65 - 99 mg/dL   Comment 1 Notify RN    Comment 2 Document in Chart   Glucose, capillary     Status: Abnormal   Collection Time: 11/11/16  7:38 AM  Result Value Ref Range   Glucose-Capillary 107 (H) 65 - 99 mg/dL    ABGS No results for input(s): PHART, PO2ART, TCO2, HCO3 in the last 72 hours.  Invalid input(s): PCO2 CULTURES No results found for this or any previous visit (from the past 240 hour(s)). Studies/Results: Dg Chest 2 View  Result Date: 11/09/2016 CLINICAL DATA:  Cardiac palpitations EXAM: CHEST  2 VIEW COMPARISON:  11/03/2015 FINDINGS: Cardiac shadow is enlarged. The lungs are well aerated bilaterally. No focal infiltrate or sizable effusion is seen. Degenerative change of thoracic spine is noted. IMPRESSION: No acute abnormality seen. Electronically Signed   By: Inez Catalina M.D.   On: 11/09/2016 21:23   Ct Abdomen Pelvis W Contrast  Result Date: 11/09/2016 CLINICAL DATA:  Acute onset of generalized abdominal pain and constipation. Initial encounter. EXAM: CT ABDOMEN AND PELVIS WITH CONTRAST TECHNIQUE: Multidetector CT imaging of the abdomen and pelvis was performed using the standard protocol following bolus administration of intravenous contrast. CONTRAST:  68m ISOVUE-300 IOPAMIDOL (ISOVUE-300) INJECTION 61% COMPARISON:   CT of the abdomen and pelvis performed 11/03/2015, and renal ultrasound performed 11/04/2015 FINDINGS: Lower chest: The  visualized lung bases are grossly clear. The visualized portions of the mediastinum are unremarkable. Hepatobiliary: The liver is unremarkable in appearance. The gallbladder is unremarkable in appearance. The common bile duct remains normal in caliber. Pancreas: The pancreas is within normal limits. Spleen: The spleen is unremarkable in appearance. Adrenals/Urinary Tract: The adrenal glands are unremarkable in appearance. There is mild right-sided and minimal left-sided hydronephrosis, which appears to reflect underlying marked bladder distention. No renal or ureteral stones are identified. Mild nonspecific perinephric stranding is noted bilaterally. Stomach/Bowel: The stomach is unremarkable in appearance. The small bowel is within normal limits. The appendix is normal in caliber, without evidence of appendicitis. The colon is contains a small amount of stool and is unremarkable in appearance, without evidence of significant constipation. Vascular/Lymphatic: Scattered calcification is seen along the abdominal aorta and its branches. The abdominal aorta is otherwise grossly unremarkable. The inferior vena cava is grossly unremarkable. No retroperitoneal lymphadenopathy is seen. No pelvic sidewall lymphadenopathy is identified. Reproductive: The uterus is unremarkable in appearance. The ovaries are not well characterized. No suspicious adnexal masses are seen. The bladder is markedly distended and grossly unremarkable in appearance. Other: No additional soft tissue abnormalities are seen. Musculoskeletal: No acute osseous abnormalities are identified. Multilevel vacuum phenomenon is noted along the lower thoracic and lumbar spine. The visualized musculature is unremarkable in appearance. IMPRESSION: 1. Mild right-sided and minimal left-sided hydronephrosis, which appears to reflect underlying  marked bladder distention. No obstructing stone seen. 2. Colon contains a small amount of stool and is unremarkable in appearance, without evidence of significant constipation. 3. Scattered aortic atherosclerosis. Electronically Signed   By: Garald Balding M.D.   On: 11/09/2016 23:30   Dg Abd 2 Views  Result Date: 11/09/2016 CLINICAL DATA:  Abdominal pain EXAM: ABDOMEN - 2 VIEW COMPARISON:  11/08/2015 FINDINGS: Scattered large and small bowel gas is noted. No definitive obstructive changes are seen. This likely represents a generalized ileus. No free air is noted. No acute bony abnormality is noted. IMPRESSION: Scattered large and small bowel gas likely representing a generalized ileus. No definitive obstructive changes are seen. Electronically Signed   By: Inez Catalina M.D.   On: 11/09/2016 21:24    Medications: I have reviewed the patient's current medications.  Assesment:  Principal Problem:   Urinary retention Active Problems:   Hypertension   Diabetes mellitus without complication (HCC)   High cholesterol   Bipolar 1 disorder (HCC)   Hyponatremia    Plan:  Medications reviewed Will continue iv antibiotics D/c Foley Catheter IVF KVO Will do CBC and BMP    LOS: 1 day   Roslind Michaux 11/11/2016, 8:27 AM

## 2016-11-11 NOTE — Progress Notes (Signed)
Charene Mccallister catheter removed per MD order. Will continue to monitor for pt to urinate on own.

## 2016-11-11 NOTE — Care Management Important Message (Signed)
Important Message  Patient Details  Name: Maria Pace MRN: [id number] Date of Birth: February 10, 1941   Medicare Important Message Given:  Yes    Yunis Voorheis, Chrystine Oiler, RN 11/11/2016, 8:20 AM

## 2016-11-11 NOTE — Clinical Social Work Note (Signed)
Clinical Social Work Assessment  Patient Details  Name: Maria Pace MRN: [id number] Date of Birth: 16-Jan-1941  Date of referral:  11/11/16               Reason for consult:  Discharge Planning, Other (Comment Required) (Return to [facility].)                Permission sought to share information with:    Permission granted to share information::     Name::        Agency::     Relationship::     Contact Information:  Dimas Aguas, staff at facility.   Housing/Transportation Living arrangements for the past 2 months:  Assisted Living Facility Source of Information:  Facility Patient Interpreter Needed:  None Criminal Activity/Legal Involvement Pertinent to Current Situation/Hospitalization:  No - Comment as needed Significant Relationships:  Siblings Lives with:  Facility Resident Do you feel safe going back to the place where you live?  Yes Need for family participation in patient care:  No (Coment)  Care giving concerns: Facility resident. No care concerns identified.    Social Worker assessment / plan:  Per Morrie Sheldon at [facility], patient has been at the facility for more than a year. Patient uses a rollator, uses, requires assistance with ADLs from staff. Patient has minimal family support. Patient can return at discharge. [name] discussed that patient may discharge today.  Message left for patient's sister, Odelia Gage.  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   Patient/Family's Understanding of and Emotional Response to Diagnosis, Current Treatment, and Prognosis:  Message left for patient's family.   Emotional Assessment Appearance:  Appears stated age Attitude/Demeanor/Rapport:    Affect (typically observed):  Unable to Assess Orientation:   Oriented to Self, Oriented to Place Alcohol / Substance use:  Not Applicable Psych involvement (Current and /or in the community):  No (Comment)  Discharge Needs  Concerns to be addressed:  No discharge needs identified Readmission within the last 30 days:  No Current discharge risk:  None Barriers to Discharge:  No Barriers Identified   Annice Needy, [name] 11/11/2016, 12:59 PM

## 2016-11-12 LAB — CBC
HEMATOCRIT: 27.2 % — AB (ref 36.0–46.0)
Hemoglobin: 9.1 g/dL — ABNORMAL LOW (ref 12.0–15.0)
MCH: 28.3 pg (ref 26.0–34.0)
MCHC: 33.5 g/dL (ref 30.0–36.0)
MCV: 84.7 fL (ref 78.0–100.0)
Platelets: 215 10*3/uL (ref 150–400)
RBC: 3.21 MIL/uL — ABNORMAL LOW (ref 3.87–5.11)
RDW: 14.2 % (ref 11.5–15.5)
WBC: 9.3 10*3/uL (ref 4.0–10.5)

## 2016-11-12 LAB — GLUCOSE, CAPILLARY
Glucose-Capillary: 88 mg/dL (ref 65–99)
Glucose-Capillary: 147 mg/dL — ABNORMAL HIGH (ref 65–99)
Glucose-Capillary: 98 mg/dL (ref 65–99)
Glucose-Capillary: 108 mg/dL — ABNORMAL HIGH (ref 65–99)

## 2016-11-12 LAB — BASIC METABOLIC PANEL
Anion gap: 7 (ref 5–15)
BUN: 11 mg/dL (ref 6–20)
CALCIUM: 8.8 mg/dL — AB (ref 8.9–10.3)
CO2: 28 mmol/L (ref 22–32)
CREATININE: 1.01 mg/dL — AB (ref 0.44–1.00)
Chloride: 94 mmol/L — ABNORMAL LOW (ref 101–111)
GFR calc non Af Amer: 53 mL/min — ABNORMAL LOW (ref >60)
GLUCOSE: 89 mg/dL (ref 65–99)
Potassium: 3.9 mmol/L (ref 3.5–5.1)
Sodium: 129 mmol/L — ABNORMAL LOW (ref 135–145)

## 2016-11-12 NOTE — Progress Notes (Signed)
Subjective: Patient is improving. She is receiving iv antibiotics. Her Foley is removed.  Objective: Vital signs in last 24 hours: Temp:  [97.6 F (36.4 C)-97.9 F (36.6 C)] 97.9 F (36.6 C) (08/18 0452) Pulse Rate:  [83-123] 83 (08/18 0452) Resp:  [18-19] 18 (08/18 0452) BP: (108-143)/(74-84) 143/80 (08/18 0452) SpO2:  [96 %-100 %] 96 % (08/18 0452) Weight change:  Last BM Date: 11/09/16  Intake/Output from previous day: 08/17 0701 - 08/18 0700 In: 1440 [P.O.:1440] Out: 3130 [Urine:3130]  PHYSICAL EXAM General appearance: alert and no distress Resp: clear to auscultation bilaterally Cardio: S1, S2 normal GI: soft, non-tender; bowel sounds normal; no masses,  no organomegaly Extremities: extremities normal, atraumatic, no cyanosis or edema  Lab Results:  Results for orders placed or performed during the hospital encounter of 11/09/16 (from the past 48 hour(s))  Glucose, capillary     Status: Abnormal   Collection Time: 11/10/16  4:37 PM  Result Value Ref Range   Glucose-Capillary 126 (H) 65 - 99 mg/dL   Comment 1 Notify RN    Comment 2 Document in Chart   Glucose, capillary     Status: Abnormal   Collection Time: 11/10/16  8:40 PM  Result Value Ref Range   Glucose-Capillary 143 (H) 65 - 99 mg/dL   Comment 1 Notify RN    Comment 2 Document in Chart   Glucose, capillary     Status: Abnormal   Collection Time: 11/11/16  7:38 AM  Result Value Ref Range   Glucose-Capillary 107 (H) 65 - 99 mg/dL  Glucose, capillary     Status: Abnormal   Collection Time: 11/11/16 11:13 AM  Result Value Ref Range   Glucose-Capillary 185 (H) 65 - 99 mg/dL  Glucose, capillary     Status: Abnormal   Collection Time: 11/11/16  4:34 PM  Result Value Ref Range   Glucose-Capillary 101 (H) 65 - 99 mg/dL  Glucose, capillary     Status: Abnormal   Collection Time: 11/11/16  8:52 PM  Result Value Ref Range   Glucose-Capillary 148 (H) 65 - 99 mg/dL   Comment 1 Notify RN    Comment 2 Document  in Chart   CBC     Status: Abnormal   Collection Time: 11/12/16  5:50 AM  Result Value Ref Range   WBC 9.3 4.0 - 10.5 K/uL   RBC 3.21 (L) 3.87 - 5.11 MIL/uL   Hemoglobin 9.1 (L) 12.0 - 15.0 g/dL   HCT 27.2 (L) 36.0 - 46.0 %   MCV 84.7 78.0 - 100.0 fL   MCH 28.3 26.0 - 34.0 pg   MCHC 33.5 30.0 - 36.0 g/dL   RDW 14.2 11.5 - 15.5 %   Platelets 215 150 - 400 K/uL  Basic metabolic panel     Status: Abnormal   Collection Time: 11/12/16  5:50 AM  Result Value Ref Range   Sodium 129 (L) 135 - 145 mmol/L   Potassium 3.9 3.5 - 5.1 mmol/L   Chloride 94 (L) 101 - 111 mmol/L   CO2 28 22 - 32 mmol/L   Glucose, Bld 89 65 - 99 mg/dL   BUN 11 6 - 20 mg/dL   Creatinine, Ser 1.01 (H) 0.44 - 1.00 mg/dL   Calcium 8.8 (L) 8.9 - 10.3 mg/dL   GFR calc non Af Amer 53 (L) >60 mL/min   GFR calc Af Amer >60 >60 mL/min    Comment: (NOTE) The eGFR has been calculated using the CKD EPI equation. This  calculation has not been validated in all clinical situations. eGFR's persistently <60 mL/min signify possible Chronic Kidney Disease.    Anion gap 7 5 - 15  Glucose, capillary     Status: None   Collection Time: 11/12/16  7:20 AM  Result Value Ref Range   Glucose-Capillary 88 65 - 99 mg/dL    ABGS No results for input(s): PHART, PO2ART, TCO2, HCO3 in the last 72 hours.  Invalid input(s): PCO2 CULTURES No results found for this or any previous visit (from the past 240 hour(s)). Studies/Results: No results found.  Medications: I have reviewed the patient's current medications.  Assesment:  Principal Problem:   Urinary retention Active Problems:   Hypertension   Diabetes mellitus without complication (HCC)   High cholesterol   Bipolar 1 disorder (HCC)   Hyponatremia    Plan:  Medications reviewed Will continue iv antibiotics Will monitor BMP    LOS: 2 days   Maria Pace 11/12/2016, 11:04 AM

## 2016-11-13 LAB — GLUCOSE, CAPILLARY
GLUCOSE-CAPILLARY: 153 mg/dL — AB (ref 65–99)
GLUCOSE-CAPILLARY: 109 mg/dL — AB (ref 65–99)
GLUCOSE-CAPILLARY: 102 mg/dL — AB (ref 65–99)
GLUCOSE-CAPILLARY: 105 mg/dL — AB (ref 65–99)

## 2016-11-13 NOTE — Progress Notes (Signed)
Subjective: Patient feels better. No new complaint.  Objective: Vital signs in last 24 hours: Temp:  [98.3 F (36.8 C)-98.7 F (37.1 C)] 98.7 F (37.1 C) (08/19 0439) Pulse Rate:  [85-89] 86 (08/19 0439) Resp:  [18-20] 18 (08/19 0439) BP: (123-145)/(60-81) 123/60 (08/19 0439) SpO2:  [95 %-98 %] 95 % (08/19 0439) Weight change:  Last BM Date: 11/10/16  Intake/Output from previous day: 08/18 0701 - 08/19 0700 In: 1390 [P.O.:1390] Out: 4480 [Urine:4480]  PHYSICAL EXAM General appearance: alert and no distress Resp: clear to auscultation bilaterally Cardio: S1, S2 normal GI: soft, non-tender; bowel sounds normal; no masses,  no organomegaly Extremities: extremities normal, atraumatic, no cyanosis or edema  Lab Results:  Results for orders placed or performed during the hospital encounter of 11/09/16 (from the past 48 hour(s))  Glucose, capillary     Status: Abnormal   Collection Time: 11/11/16  4:34 PM  Result Value Ref Range   Glucose-Capillary 101 (H) 65 - 99 mg/dL  Glucose, capillary     Status: Abnormal   Collection Time: 11/11/16  8:52 PM  Result Value Ref Range   Glucose-Capillary 148 (H) 65 - 99 mg/dL   Comment 1 Notify RN    Comment 2 Document in Chart   CBC     Status: Abnormal   Collection Time: 11/12/16  5:50 AM  Result Value Ref Range   WBC 9.3 4.0 - 10.5 K/uL   RBC 3.21 (L) 3.87 - 5.11 MIL/uL   Hemoglobin 9.1 (L) 12.0 - 15.0 g/dL   HCT 27.2 (L) 36.0 - 46.0 %   MCV 84.7 78.0 - 100.0 fL   MCH 28.3 26.0 - 34.0 pg   MCHC 33.5 30.0 - 36.0 g/dL   RDW 14.2 11.5 - 15.5 %   Platelets 215 150 - 400 K/uL  Basic metabolic panel     Status: Abnormal   Collection Time: 11/12/16  5:50 AM  Result Value Ref Range   Sodium 129 (L) 135 - 145 mmol/L   Potassium 3.9 3.5 - 5.1 mmol/L   Chloride 94 (L) 101 - 111 mmol/L   CO2 28 22 - 32 mmol/L   Glucose, Bld 89 65 - 99 mg/dL   BUN 11 6 - 20 mg/dL   Creatinine, Ser 1.01 (H) 0.44 - 1.00 mg/dL   Calcium 8.8 (L) 8.9 - 10.3  mg/dL   GFR calc non Af Amer 53 (L) >60 mL/min   GFR calc Af Amer >60 >60 mL/min    Comment: (NOTE) The eGFR has been calculated using the CKD EPI equation. This calculation has not been validated in all clinical situations. eGFR's persistently <60 mL/min signify possible Chronic Kidney Disease.    Anion gap 7 5 - 15  Glucose, capillary     Status: None   Collection Time: 11/12/16  7:20 AM  Result Value Ref Range   Glucose-Capillary 88 65 - 99 mg/dL  Glucose, capillary     Status: Abnormal   Collection Time: 11/12/16 11:16 AM  Result Value Ref Range   Glucose-Capillary 147 (H) 65 - 99 mg/dL  Glucose, capillary     Status: None   Collection Time: 11/12/16  5:18 PM  Result Value Ref Range   Glucose-Capillary 98 65 - 99 mg/dL  Glucose, capillary     Status: Abnormal   Collection Time: 11/12/16  8:55 PM  Result Value Ref Range   Glucose-Capillary 108 (H) 65 - 99 mg/dL   Comment 1 Notify RN    Comment 2  Document in Chart   Glucose, capillary     Status: Abnormal   Collection Time: 11/13/16  7:26 AM  Result Value Ref Range   Glucose-Capillary 153 (H) 65 - 99 mg/dL    ABGS No results for input(s): PHART, PO2ART, TCO2, HCO3 in the last 72 hours.  Invalid input(s): PCO2 CULTURES No results found for this or any previous visit (from the past 240 hour(s)). Studies/Results: No results found.  Medications: I have reviewed the patient's current medications.  Assesment:  Principal Problem:   Urinary retention Active Problems:   Hypertension   Diabetes mellitus without complication (HCC)   High cholesterol   Bipolar 1 disorder (HCC)   Hyponatremia    Plan:  Medications reviewed Will continue iv antibiotics Will monitor BMP    LOS: 3 days   Maria Pace 11/13/2016, 11:14 AM

## 2016-11-14 LAB — GLUCOSE, CAPILLARY
GLUCOSE-CAPILLARY: 131 mg/dL — AB (ref 65–99)
Glucose-Capillary: 92 mg/dL (ref 65–99)

## 2016-11-14 NOTE — Progress Notes (Signed)
Check patient's blood pressure prior to administration of Metoprolol.  Hold if systolic blood pressure is less than 100 and hold if diastolic blood pressure is less than 60.

## 2016-11-14 NOTE — Clinical Social Work Note (Signed)
Patient's nurse advised that she would notifiy facility of patient's discharge. [name] sent clinicals via [employer].  [name] left a message for patient's sister, [name], advising that patient was discharged and would return to the facility today.    [name] signing off.       Ishanvi Mcquitty, Juleen China, [name]

## 2016-11-14 NOTE — NC FL2 (Signed)
[place] MEDICAID FL2 LEVEL OF CARE SCREENING TOOL     IDENTIFICATION  Patient Name: Maria Pace Birthdate: 07-Sep-1940 Sex: female Admission Date (Current Location): 11/09/2016  [facility] and [place] Number:  [employer] and Address:  [facility],  [address] [place] [address]      Provider Number: [id number]  Attending Physician Name and Address:  Avon Gully, MD  Relative Name and Phone Number:       Current Level of Care: Hospital Recommended Level of Care: [facility] Prior Approval Number:    Date Approved/Denied:   PASRR Number: [id number] Kirtland Bouchard ([id number])  Discharge Plan: Other (Comment) [facility] )    Current Diagnoses: Patient Active Problem List   Diagnosis Date Noted  . Urinary retention 11/10/2016  . Hypertension 11/10/2016  . Diabetes mellitus without complication (HCC) 11/10/2016  . High cholesterol 11/10/2016  . Bipolar 1 disorder (HCC) 11/10/2016  . Hyponatremia 11/10/2016  . SBO (small bowel obstruction) (HCC) 11/03/2015    Orientation RESPIRATION BLADDER Height & Weight     Self, Place  Normal Incontinent Weight: 191 lb 12.8 oz (87 kg) Height:  5' (152.4 cm)  BEHAVIORAL SYMPTOMS/MOOD NEUROLOGICAL BOWEL NUTRITION STATUS      Continent Diet (Carb modified.)  AMBULATORY STATUS COMMUNICATION OF NEEDS Skin   Limited Assist Verbally Normal                       Personal Care Assistance Level of Assistance  Bathing, Feeding, Dressing Bathing Assistance: Limited assistance Feeding assistance: Limited assistance (Staff chops food.) Dressing Assistance: Limited assistance     Functional Limitations Info  Sight, Hearing, Speech Sight Info: Adequate Hearing Info: Adequate Speech Info: Adequate    SPECIAL CARE FACTORS FREQUENCY                       Contractures Contractures Info: Not present    Additional Factors Info  Code Status, Allergies, Psychotropic Code Status Info:  Full Code Allergies Info: Haloperidol Lactate Psychotropic Info: Cogentin, Zyprexa, Risperdal, Desyrel         Current Medications (11/14/2016):  This is the current hospital active medication list Current Facility-Administered Medications  Medication Dose Route Frequency Provider Last Rate Last Dose  . 0.9 %  sodium chloride infusion   Intravenous Continuous Avon Gully, MD 10 mL/hr at 11/11/16 0826    . acetaminophen (TYLENOL) tablet 500 mg  500 mg Oral Q8H PRN Houston Siren, MD      . albuterol (PROVENTIL) (2.5 MG/3ML) 0.083% nebulizer solution 3 mL  3 mL Inhalation Q6H PRN Houston Siren, MD      . atorvastatin (LIPITOR) tablet 10 mg  10 mg Oral Daily Houston Siren, MD   10 mg at 11/13/16 1014  . benztropine (COGENTIN) tablet 1 mg  1 mg Oral Daily Houston Siren, MD   1 mg at 11/13/16 1014  . cefTRIAXone (ROCEPHIN) 1 g in dextrose 5 % 50 mL IVPB  1 g Intravenous Q24H Houston Siren, MD   Stopped at 11/14/16 [phone]  . docusate sodium (COLACE) capsule 200 mg  200 mg Oral Daily Houston Siren, MD   200 mg at 11/13/16 1014  . heparin injection 5,000 Units  5,000 Units Subcutaneous Q8H Houston Siren, MD   5,000 Units at 11/14/16 0529  . insulin aspart (novoLOG) injection 0-5 Units  0-5 Units Subcutaneous QHS Houston Siren, MD      . insulin aspart (novoLOG) injection  0-9 Units  0-9 Units Subcutaneous TID WC Houston Siren, MD   2 Units at 11/13/16 [phone]  . latanoprost (XALATAN) 0.005 % ophthalmic solution 1 drop  1 drop Both Eyes QHS Houston Siren, MD   1 drop at 11/13/16 2133  . linagliptin (TRADJENTA) tablet 5 mg  5 mg Oral Daily Houston Siren, MD   5 mg at 11/13/16 1000  . lisinopril (PRINIVIL,ZESTRIL) tablet 20 mg  20 mg Oral Daily Houston Siren, MD   20 mg at 11/13/16 1014  . loratadine (CLARITIN) tablet 10 mg  10 mg Oral Daily Houston Siren, MD   10 mg at 11/13/16 1000  . OLANZapine (ZYPREXA) tablet 20 mg  20 mg Oral QHS Houston Siren, MD   20 mg at 11/13/16 2134  . ondansetron (ZOFRAN) tablet 4 mg  4 mg Oral Q6H PRN Houston Siren, MD       Or  .  ondansetron [facility]) injection 4 mg  4 mg Intravenous Q6H PRN Houston Siren, MD      . pantoprazole (PROTONIX) EC tablet 40 mg  40 mg Oral Daily Houston Siren, MD   40 mg at 11/13/16 1014  . polyethylene glycol (MIRALAX / GLYCOLAX) packet 17 g  17 g Oral Daily PRN Avon Gully, MD      . risperiDONE (RISPERDAL) tablet 1 mg  1 mg Oral BID Houston Siren, MD   1 mg at 11/13/16 2134  . sodium chloride flush (NS) 0.9 % injection 3 mL  3 mL Intravenous Q12H Houston Siren, MD   3 mL at 11/13/16 2137  . traZODone (DESYREL) tablet 50 mg  50 mg Oral QHS Houston Siren, MD   50 mg at 11/13/16 2134  . Valproate Sodium (DEPAKENE) solution 250 mg  250 mg Oral TID WC Houston Siren, MD   250 mg at 11/13/16 1700     Discharge Medications:    Medication List     TAKE these medications   acetaminophen 500 MG tablet Commonly known as:  TYLENOL Take 500 mg by mouth every 8 (eight) hours as needed for mild pain or moderate pain.   atorvastatin 10 MG tablet Commonly known as:  LIPITOR Take 10 mg by mouth daily.   benztropine 1 MG tablet Commonly known as:  COGENTIN Take 1 mg by mouth 2 (two) times daily.   diclofenac sodium 1 % Gel Commonly known as:  VOLTAREN Apply 2 g topically 2 (two) times daily as needed.   docusate sodium 100 MG capsule Commonly known as:  COLACE Take 200 mg by mouth daily.   hydrochlorothiazide 12.5 MG capsule Commonly known as:  MICROZIDE Take 12.5 mg by mouth daily.   latanoprost 0.005 % ophthalmic solution Commonly known as:  XALATAN Place 1 drop into both eyes at bedtime.   linagliptin 5 MG Tabs tablet Commonly known as:  TRADJENTA Take 5 mg by mouth daily.   lisinopril 20 MG tablet Commonly known as:  PRINIVIL,ZESTRIL Take 20 mg by mouth daily.   loratadine 10 MG tablet Commonly known as:  CLARITIN Take 10 mg by mouth daily.   metFORMIN 1000 MG tablet Commonly known as:  GLUCOPHAGE Take 1,000 mg by mouth 2 (two) times daily with a meal.   metoprolol tartrate 25  MG tablet Commonly known as:  LOPRESSOR Take 1 tablet (25 mg total) by mouth 2 (two) times daily.   nystatin cream Commonly known as:  MYCOSTATIN Apply 1 application topically 2 (two) times daily as needed for dry skin. Apply to  the affected external area(s) twice a day before applying triamcinolone cream   OLANZapine 20 MG tablet Commonly known as:  ZYPREXA Take 20 mg by mouth at bedtime.   pantoprazole 40 MG tablet Commonly known as:  PROTONIX Take 40 mg by mouth daily.   polyethylene glycol powder powder Commonly known as:  GLYCOLAX/MIRALAX Take 17 g by mouth daily as needed for mild constipation or moderate constipation.   PROAIR HFA 108 (90 Base) MCG/ACT inhaler Generic drug:  albuterol Inhale 2 puffs into the lungs every 6 (six) hours as needed for wheezing or shortness of breath.   risperiDONE 2 MG tablet Commonly known as:  RISPERDAL Take 2 mg by mouth 2 (two) times daily.   traZODone 50 MG tablet Commonly known as:  DESYREL Take 50 mg by mouth at bedtime.   triamcinolone cream 0.1 % Commonly known as:  KENALOG Apply 1 application topically 2 (two) times daily as needed. Apply to the affected external area(s) twice a day as needed - apply after the nystatin cream   Valproic Acid 250 MG/5ML Soln Take 5 mLs by mouth 3 (three) times daily with meals.   VICKS VAPORUB 4.7-1.2-2.6 % Oint Apply 1 application topically daily. Apply once to toenails daily     Relevant Imaging Results:  Relevant Lab Results:   Additional Information    Jullia Mulligan, Juleen China, [name]

## 2016-11-14 NOTE — Progress Notes (Signed)
Dr. Felecia Shelling notified of patient's last sodium.  No additional orders but to continue with hospital follow-up.  Dr. Felecia Shelling also notified unable to get a cardiology follow-up until 12/08/16.  Dr. Felecia Shelling notified and stated that this was ok for discharge.  Dr. Felecia Shelling verified patient's discharge mediations and to have patient check blood pressure prior to Metroprolol administration.

## 2016-11-14 NOTE — Progress Notes (Signed)
[name] notified of patient's discharge back to facility today.  Facility to provide transport home after 3 p.m. today.

## 2016-11-14 NOTE — Discharge Summary (Signed)
Physician Discharge Summary  Patient ID: Maria Pace MRN: [id number] DOB/AGE: [birth year] 76 y.o. Primary Care Physician:Roman Dubuc, MD Admit date: 11/09/2016 Discharge date: 11/14/2016    Discharge Diagnoses:   Principal Problem:   Urinary retention Active Problems:   Hypertension   Diabetes mellitus without complication (HCC)   High cholesterol   Bipolar 1 disorder (HCC)   Hyponatremia   Allergies as of 11/14/2016      Reactions   Haloperidol Lactate Other (See Comments)   Couldn't urinate anymore      Medication List    TAKE these medications   acetaminophen 500 MG tablet Commonly known as:  TYLENOL Take 500 mg by mouth every 8 (eight) hours as needed for mild pain or moderate pain.   atorvastatin 10 MG tablet Commonly known as:  LIPITOR Take 10 mg by mouth daily.   benztropine 1 MG tablet Commonly known as:  COGENTIN Take 1 mg by mouth 2 (two) times daily.   diclofenac sodium 1 % Gel Commonly known as:  VOLTAREN Apply 2 g topically 2 (two) times daily as needed.   docusate sodium 100 MG capsule Commonly known as:  COLACE Take 200 mg by mouth daily.   hydrochlorothiazide 12.5 MG capsule Commonly known as:  MICROZIDE Take 12.5 mg by mouth daily.   latanoprost 0.005 % ophthalmic solution Commonly known as:  XALATAN Place 1 drop into both eyes at bedtime.   linagliptin 5 MG Tabs tablet Commonly known as:  TRADJENTA Take 5 mg by mouth daily.   lisinopril 20 MG tablet Commonly known as:  PRINIVIL,ZESTRIL Take 20 mg by mouth daily.   loratadine 10 MG tablet Commonly known as:  CLARITIN Take 10 mg by mouth daily.   metFORMIN 1000 MG tablet Commonly known as:  GLUCOPHAGE Take 1,000 mg by mouth 2 (two) times daily with a meal.   metoprolol tartrate 25 MG tablet Commonly known as:  LOPRESSOR Take 1 tablet (25 mg total) by mouth 2 (two) times daily.   nystatin cream Commonly known as:  MYCOSTATIN Apply 1 application topically 2 (two) times  daily as needed for dry skin. Apply to the affected external area(s) twice a day before applying triamcinolone cream   OLANZapine 20 MG tablet Commonly known as:  ZYPREXA Take 20 mg by mouth at bedtime.   pantoprazole 40 MG tablet Commonly known as:  PROTONIX Take 40 mg by mouth daily.   polyethylene glycol powder powder Commonly known as:  GLYCOLAX/MIRALAX Take 17 g by mouth daily as needed for mild constipation or moderate constipation.   PROAIR HFA 108 (90 Base) MCG/ACT inhaler Generic drug:  albuterol Inhale 2 puffs into the lungs every 6 (six) hours as needed for wheezing or shortness of breath.   risperiDONE 2 MG tablet Commonly known as:  RISPERDAL Take 2 mg by mouth 2 (two) times daily.   traZODone 50 MG tablet Commonly known as:  DESYREL Take 50 mg by mouth at bedtime.   triamcinolone cream 0.1 % Commonly known as:  KENALOG Apply 1 application topically 2 (two) times daily as needed. Apply to the affected external area(s) twice a day as needed - apply after the nystatin cream   Valproic Acid 250 MG/5ML Soln Take 5 mLs by mouth 3 (three) times daily with meals.   VICKS VAPORUB 4.7-1.2-2.6 % Oint Apply 1 application topically daily. Apply once to toenails daily       Discharged Condition: improved    Consults: None  Significant Diagnostic Studies: Dg Chest  2 View  Result Date: 11/09/2016 CLINICAL DATA:  Cardiac palpitations EXAM: CHEST  2 VIEW COMPARISON:  11/03/2015 FINDINGS: Cardiac shadow is enlarged. The lungs are well aerated bilaterally. No focal infiltrate or sizable effusion is seen. Degenerative change of thoracic spine is noted. IMPRESSION: No acute abnormality seen. Electronically Signed   By: Alcide Clever M.D.   On: 11/09/2016 21:23   Ct Abdomen Pelvis W Contrast  Result Date: 11/09/2016 CLINICAL DATA:  Acute onset of generalized abdominal pain and constipation. Initial encounter. EXAM: CT ABDOMEN AND PELVIS WITH CONTRAST TECHNIQUE: Multidetector  CT imaging of the abdomen and pelvis was performed using the standard protocol following bolus administration of intravenous contrast. CONTRAST:  33mL ISOVUE-300 IOPAMIDOL (ISOVUE-300) INJECTION 61% COMPARISON:  CT of the abdomen and pelvis performed 11/03/2015, and renal ultrasound performed 11/04/2015 FINDINGS: Lower chest: The visualized lung bases are grossly clear. The visualized portions of the mediastinum are unremarkable. Hepatobiliary: The liver is unremarkable in appearance. The gallbladder is unremarkable in appearance. The common bile duct remains normal in caliber. Pancreas: The pancreas is within normal limits. Spleen: The spleen is unremarkable in appearance. Adrenals/Urinary Tract: The adrenal glands are unremarkable in appearance. There is mild right-sided and minimal left-sided hydronephrosis, which appears to reflect underlying marked bladder distention. No renal or ureteral stones are identified. Mild nonspecific perinephric stranding is noted bilaterally. Stomach/Bowel: The stomach is unremarkable in appearance. The small bowel is within normal limits. The appendix is normal in caliber, without evidence of appendicitis. The colon is contains a small amount of stool and is unremarkable in appearance, without evidence of significant constipation. Vascular/Lymphatic: Scattered calcification is seen along the abdominal aorta and its branches. The abdominal aorta is otherwise grossly unremarkable. The inferior vena cava is grossly unremarkable. No retroperitoneal lymphadenopathy is seen. No pelvic sidewall lymphadenopathy is identified. Reproductive: The uterus is unremarkable in appearance. The ovaries are not well characterized. No suspicious adnexal masses are seen. The bladder is markedly distended and grossly unremarkable in appearance. Other: No additional soft tissue abnormalities are seen. Musculoskeletal: No acute osseous abnormalities are identified. Multilevel vacuum phenomenon is noted  along the lower thoracic and lumbar spine. The visualized musculature is unremarkable in appearance. IMPRESSION: 1. Mild right-sided and minimal left-sided hydronephrosis, which appears to reflect underlying marked bladder distention. No obstructing stone seen. 2. Colon contains a small amount of stool and is unremarkable in appearance, without evidence of significant constipation. 3. Scattered aortic atherosclerosis. Electronically Signed   By: Roanna Raider M.D.   On: 11/09/2016 23:30   Dg Abd 2 Views  Result Date: 11/09/2016 CLINICAL DATA:  Abdominal pain EXAM: ABDOMEN - 2 VIEW COMPARISON:  11/08/2015 FINDINGS: Scattered large and small bowel gas is noted. No definitive obstructive changes are seen. This likely represents a generalized ileus. No free air is noted. No acute bony abnormality is noted. IMPRESSION: Scattered large and small bowel gas likely representing a generalized ileus. No definitive obstructive changes are seen. Electronically Signed   By: Alcide Clever M.D.   On: 11/09/2016 21:24    Lab Results: Basic Metabolic Panel:  Recent Labs  80/16/55 0550  NA 129*  K 3.9  CL 94*  CO2 28  GLUCOSE 89  BUN 11  CREATININE 1.01*  CALCIUM 8.8*   Liver Function Tests: No results for input(s): AST, ALT, ALKPHOS, BILITOT, PROT, ALBUMIN in the last 72 hours.   CBC:  Recent Labs  11/12/16 0550  WBC 9.3  HGB 9.1*  HCT 27.2*  MCV 84.7  PLT  215    No results found for this or any previous visit (from the past 240 hour(s)).   Hospital Course:   This is a 76 years old female who was admitted due to UTI and urinary retention was treated with IV antibiotics. Foley catheter was initially inserted and removed after 48 hours. Patient is urinating without difficulty. She received Iv antibiotics for 5 days. She is back to her baseline. Patient is being discharged in stable conditions to be followed in out patient.  Discharge Exam: Blood pressure (!) 114/59, pulse 80, temperature 98.4  F (36.9 C), temperature source Oral, resp. rate 14, height 5' (1.524 m), weight 87 kg (191 lb 12.8 oz), SpO2 92 %.    Disposition:  Assisted living.     Contact information for follow-up providers    Avon Gully, MD. Schedule an appointment as soon as possible for a visit in 2 week(s).   Specialty:  Internal Medicine Why:  As needed Contact information: [address] [place] [address] [phone]        Laqueta Linden, MD. Schedule an appointment as soon as possible for a visit in 2 day(s).   Specialty:  Cardiology Why:  As needed Contact information: [address] [place] [address] [phone]            Contact information for after-discharge care    Destination    [name] Follow up.   Specialty:  Group Home Contact information: [address] [place] [address] [phone]                  Signed: Avon Gully   11/14/2016, 8:45 AM

## 2016-11-14 NOTE — Progress Notes (Signed)
AVS reviewed with caregiver.  Verbalized understanding of discharge instructions, physician follow-up, medications.  Patient's IV removed.  Site WNL.  Patient transported by NT via wheelchair to main entrance at discharge.  Patient stable at time of discharge.

## 2016-11-14 NOTE — Clinical Social Work Note (Signed)
[name] contacted Dr. Letitia Neri office and requested that he cosign patient's FL2 in his Epic inbox.      Asmi Fugere, Juleen China, [name]

## 2016-11-15 DIAGNOSIS — F332 Major depressive disorder, recurrent severe without psychotic features: Secondary | ICD-10-CM | POA: Diagnosis not present

## 2016-11-18 DIAGNOSIS — F209 Schizophrenia, unspecified: Secondary | ICD-10-CM | POA: Diagnosis not present

## 2016-11-18 DIAGNOSIS — E871 Hypo-osmolality and hyponatremia: Secondary | ICD-10-CM | POA: Diagnosis not present

## 2016-11-18 DIAGNOSIS — N390 Urinary tract infection, site not specified: Secondary | ICD-10-CM | POA: Diagnosis not present

## 2016-11-18 DIAGNOSIS — F319 Bipolar disorder, unspecified: Secondary | ICD-10-CM | POA: Diagnosis not present

## 2016-11-18 DIAGNOSIS — I10 Essential (primary) hypertension: Secondary | ICD-10-CM | POA: Diagnosis not present

## 2016-11-18 DIAGNOSIS — E1165 Type 2 diabetes mellitus with hyperglycemia: Secondary | ICD-10-CM | POA: Diagnosis not present

## 2016-11-18 DIAGNOSIS — R339 Retention of urine, unspecified: Secondary | ICD-10-CM | POA: Diagnosis not present

## 2016-11-18 DIAGNOSIS — E669 Obesity, unspecified: Secondary | ICD-10-CM | POA: Diagnosis not present

## 2016-11-18 DIAGNOSIS — Z0000 Encounter for general adult medical examination without abnormal findings: Secondary | ICD-10-CM | POA: Diagnosis not present

## 2016-11-29 DIAGNOSIS — H401131 Primary open-angle glaucoma, bilateral, mild stage: Secondary | ICD-10-CM | POA: Diagnosis not present

## 2016-12-07 ENCOUNTER — Encounter: Payer: Self-pay | Admitting: Cardiology

## 2016-12-07 NOTE — Progress Notes (Deleted)
Cardiology Office Note  Date: 12/07/2016   ID: Maria Pace, DOB [birth year], MRN [id number]  PCP: Avon Gully, MD  Consulting Cardiologist: Nona Dell, MD   No chief complaint on file.   History of Present Illness: Maria Pace is a 76 y.o. female referred for cardiology consultation by Dr. Felecia Shelling. I was not provided with any information regarding the specifics of this consultation. Record review finds hospitalization in August with UTI and urinary retention associated with renal insufficiency. It looks like she may have been referred with concerns about possible atrial flutter documented during hospital stay. I reviewed her ECGs and available telemetry strips. There is no mention of arrhythmia in the rounding notes.  Past Medical History:  Diagnosis Date  . Bipolar 1 disorder (HCC)   . Essential hypertension   . GERD (gastroesophageal reflux disease)   . Hyperlipidemia   . Irregular heart beat   . Schizophrenia (HCC)   . Type 2 diabetes mellitus (HCC)     Past Surgical History:  Procedure Laterality Date  . YAG LASER APPLICATION Left 01/06/2015   Procedure: YAG LASER APPLICATION;  Surgeon: Jethro Bolus, MD;  Location: [facility];  Service: Ophthalmology;  Laterality: Left;    Current Outpatient Prescriptions  Medication Sig Dispense Refill  . acetaminophen (TYLENOL) 500 MG tablet Take 500 mg by mouth every 8 (eight) hours as needed for mild pain or moderate pain.    Marland Kitchen albuterol (PROAIR HFA) 108 (90 Base) MCG/ACT inhaler Inhale 2 puffs into the lungs every 6 (six) hours as needed for wheezing or shortness of breath.    Marland Kitchen atorvastatin (LIPITOR) 10 MG tablet Take 10 mg by mouth daily.    . benztropine (COGENTIN) 1 MG tablet Take 1 mg by mouth 2 (two) times daily.    . Camphor-Eucalyptus-Menthol (VICKS VAPORUB) 4.7-1.2-2.6 % OINT Apply 1 application topically daily. Apply once to toenails daily    . diclofenac sodium (VOLTAREN) 1 % GEL Apply 2 g topically 2 (two) times  daily as needed.    . docusate sodium (COLACE) 100 MG capsule Take 200 mg by mouth daily.    . hydrochlorothiazide (MICROZIDE) 12.5 MG capsule Take 12.5 mg by mouth daily.    Marland Kitchen latanoprost (XALATAN) 0.005 % ophthalmic solution Place 1 drop into both eyes at bedtime.    Marland Kitchen linagliptin (TRADJENTA) 5 MG TABS tablet Take 5 mg by mouth daily.    Marland Kitchen lisinopril (PRINIVIL,ZESTRIL) 20 MG tablet Take 20 mg by mouth daily.     Marland Kitchen loratadine (CLARITIN) 10 MG tablet Take 10 mg by mouth daily.    . metFORMIN (GLUCOPHAGE) 1000 MG tablet Take 1,000 mg by mouth 2 (two) times daily with a meal.    . metoprolol tartrate (LOPRESSOR) 25 MG tablet Take 1 tablet (25 mg total) by mouth 2 (two) times daily. 60 tablet 0  . nystatin cream (MYCOSTATIN) Apply 1 application topically 2 (two) times daily as needed for dry skin. Apply to the affected external area(s) twice a day before applying triamcinolone cream    . OLANZapine (ZYPREXA) 20 MG tablet Take 20 mg by mouth at bedtime.    . pantoprazole (PROTONIX) 40 MG tablet Take 40 mg by mouth daily.    . polyethylene glycol powder (GLYCOLAX/MIRALAX) powder Take 17 g by mouth daily as needed for mild constipation or moderate constipation.    . risperiDONE (RISPERDAL) 2 MG tablet Take 2 mg by mouth 2 (two) times daily.    . traZODone (DESYREL) 50 MG  tablet Take 50 mg by mouth at bedtime.    . triamcinolone cream (KENALOG) 0.1 % Apply 1 application topically 2 (two) times daily as needed. Apply to the affected external area(s) twice a day as needed - apply after the nystatin cream    . Valproate Sodium (VALPROIC ACID) 250 MG/5ML SOLN Take 5 mLs by mouth 3 (three) times daily with meals.     No current facility-administered medications for this visit.    Allergies:  Haloperidol lactate   Social History: The patient  reports that she has never smoked. She has never used smokeless tobacco. She reports that she does not drink alcohol or use drugs.   Family History: The patient's  family history is not on file.   ROS:  Please see the history of present illness. Otherwise, complete review of systems is positive for {NONE DEFAULTED:18576::"none"}.  All other systems are reviewed and negative.   Physical Exam: VS:  There were no vitals taken for this visit., BMI There is no height or weight on file to calculate BMI.  Wt Readings from Last 3 Encounters:  11/10/16 191 lb 12.8 oz (87 kg)  11/03/15 219 lb 9.3 oz (99.6 kg)    General: Patient appears comfortable at rest. HEENT: Conjunctiva and lids normal, oropharynx clear with moist mucosa. Neck: Supple, no elevated JVP or carotid bruits, no thyromegaly. Lungs: Clear to auscultation, nonlabored breathing at rest. Cardiac: Regular rate and rhythm, no S3 or significant systolic murmur, no pericardial rub. Abdomen: Soft, nontender, no hepatomegaly, bowel sounds present, no guarding or rebound. Extremities: No pitting edema, distal pulses 2+. Skin: Warm and dry. Musculoskeletal: No kyphosis. Neuropsychiatric: Alert and oriented x3, affect grossly appropriate.  ECG: I personally reviewed the tracing from 11/09/2016 which shows possible atypical atrial flutter with 2:1 block versus ectopic atrial tachycardia.  Recent Labwork: 11/09/2016: ALT 12; AST 22 11/10/2016: TSH 2.084 11/12/2016: BUN 11; Creatinine, Ser 1.01; Hemoglobin 9.1; Platelets 215; Potassium 3.9; Sodium 129     Component Value Date/Time   CHOL 204 (H) 10/30/2013 0501   TRIG 240 (H) 10/30/2013 0501   HDL 38 (L) 10/30/2013 0501   VLDL 48 (H) 10/30/2013 0501   LDLCALC 118 (H) 10/30/2013 0501    Other Studies Reviewed Today:  Chest x-ray 11/09/2016: FINDINGS: Cardiac shadow is enlarged. The lungs are well aerated bilaterally. No focal infiltrate or sizable effusion is seen. Degenerative change of thoracic spine is noted.  IMPRESSION: No acute abnormality seen.  Assessment and Plan:   Current medicines were reviewed with the patient today.  No  orders of the defined types were placed in this encounter.   Disposition:  Signed, Jonelle SidleSamuel G. Phylliss Strege, MD, [facility] 12/07/2016 10:17 AM    [facility] [address], [place], [place] [address] Phone: [phone]; Fax: [phone]

## 2016-12-08 ENCOUNTER — Ambulatory Visit: Payer: Medicare Other | Admitting: Cardiology

## 2016-12-20 DIAGNOSIS — E1142 Type 2 diabetes mellitus with diabetic polyneuropathy: Secondary | ICD-10-CM | POA: Diagnosis not present

## 2016-12-20 DIAGNOSIS — B351 Tinea unguium: Secondary | ICD-10-CM | POA: Diagnosis not present

## 2016-12-20 DIAGNOSIS — I739 Peripheral vascular disease, unspecified: Secondary | ICD-10-CM | POA: Diagnosis not present

## 2017-01-10 DIAGNOSIS — I10 Essential (primary) hypertension: Secondary | ICD-10-CM | POA: Diagnosis not present

## 2017-01-10 DIAGNOSIS — F203 Undifferentiated schizophrenia: Secondary | ICD-10-CM | POA: Diagnosis not present

## 2017-01-10 DIAGNOSIS — Z23 Encounter for immunization: Secondary | ICD-10-CM | POA: Diagnosis not present

## 2017-01-10 DIAGNOSIS — E1165 Type 2 diabetes mellitus with hyperglycemia: Secondary | ICD-10-CM | POA: Diagnosis not present

## 2017-02-20 ENCOUNTER — Other Ambulatory Visit: Payer: Self-pay

## 2017-02-20 ENCOUNTER — Emergency Department (HOSPITAL_COMMUNITY): Payer: Medicare Other

## 2017-02-20 ENCOUNTER — Encounter (HOSPITAL_COMMUNITY): Payer: Self-pay | Admitting: Emergency Medicine

## 2017-02-20 ENCOUNTER — Inpatient Hospital Stay (HOSPITAL_COMMUNITY)
Admission: EM | Admit: 2017-02-20 | Discharge: 2017-02-23 | DRG: 683 | Disposition: A | Discharge status: Home Health | Payer: Medicare Other | Attending: Internal Medicine | Admitting: Internal Medicine

## 2017-02-20 DIAGNOSIS — E785 Hyperlipidemia, unspecified: Secondary | ICD-10-CM | POA: Diagnosis present

## 2017-02-20 DIAGNOSIS — Z888 Allergy status to other drugs, medicaments and biological substances status: Secondary | ICD-10-CM

## 2017-02-20 DIAGNOSIS — R140 Abdominal distension (gaseous): Secondary | ICD-10-CM | POA: Diagnosis not present

## 2017-02-20 DIAGNOSIS — F319 Bipolar disorder, unspecified: Secondary | ICD-10-CM | POA: Diagnosis present

## 2017-02-20 DIAGNOSIS — E871 Hypo-osmolality and hyponatremia: Secondary | ICD-10-CM | POA: Diagnosis not present

## 2017-02-20 DIAGNOSIS — K5909 Other constipation: Secondary | ICD-10-CM | POA: Diagnosis present

## 2017-02-20 DIAGNOSIS — E872 Acidosis, unspecified: Secondary | ICD-10-CM | POA: Diagnosis present

## 2017-02-20 DIAGNOSIS — I959 Hypotension, unspecified: Secondary | ICD-10-CM | POA: Diagnosis present

## 2017-02-20 DIAGNOSIS — R109 Unspecified abdominal pain: Secondary | ICD-10-CM | POA: Diagnosis present

## 2017-02-20 DIAGNOSIS — N179 Acute kidney failure, unspecified: Secondary | ICD-10-CM | POA: Diagnosis not present

## 2017-02-20 DIAGNOSIS — F209 Schizophrenia, unspecified: Secondary | ICD-10-CM | POA: Diagnosis present

## 2017-02-20 DIAGNOSIS — Z7984 Long term (current) use of oral hypoglycemic drugs: Secondary | ICD-10-CM

## 2017-02-20 DIAGNOSIS — I9589 Other hypotension: Secondary | ICD-10-CM | POA: Diagnosis not present

## 2017-02-20 DIAGNOSIS — Z79899 Other long term (current) drug therapy: Secondary | ICD-10-CM

## 2017-02-20 DIAGNOSIS — K219 Gastro-esophageal reflux disease without esophagitis: Secondary | ICD-10-CM | POA: Diagnosis present

## 2017-02-20 DIAGNOSIS — A419 Sepsis, unspecified organism: Secondary | ICD-10-CM | POA: Diagnosis not present

## 2017-02-20 DIAGNOSIS — K5900 Constipation, unspecified: Secondary | ICD-10-CM | POA: Diagnosis present

## 2017-02-20 DIAGNOSIS — E860 Dehydration: Secondary | ICD-10-CM | POA: Diagnosis present

## 2017-02-20 DIAGNOSIS — N178 Other acute kidney failure: Secondary | ICD-10-CM | POA: Diagnosis not present

## 2017-02-20 DIAGNOSIS — I10 Essential (primary) hypertension: Secondary | ICD-10-CM | POA: Diagnosis present

## 2017-02-20 DIAGNOSIS — E119 Type 2 diabetes mellitus without complications: Secondary | ICD-10-CM | POA: Diagnosis not present

## 2017-02-20 LAB — URINALYSIS, ROUTINE W REFLEX MICROSCOPIC
Bilirubin Urine: NEGATIVE
GLUCOSE, UA: NEGATIVE mg/dL
Hgb urine dipstick: NEGATIVE
KETONES UR: NEGATIVE mg/dL
LEUKOCYTES UA: NEGATIVE
Nitrite: NEGATIVE
PH: 5 (ref 5.0–8.0)
Protein, ur: NEGATIVE mg/dL
SPECIFIC GRAVITY, URINE: 1.013 (ref 1.005–1.030)

## 2017-02-20 LAB — COMPREHENSIVE METABOLIC PANEL
ALT: 15 U/L (ref 14–54)
ANION GAP: 20 — AB (ref 5–15)
AST: 37 U/L (ref 15–41)
Albumin: 3.5 g/dL (ref 3.5–5.0)
Alkaline Phosphatase: 45 U/L (ref 38–126)
BILIRUBIN TOTAL: 0.4 mg/dL (ref 0.3–1.2)
BUN: 35 mg/dL — ABNORMAL HIGH (ref 6–20)
CO2: 15 mmol/L — ABNORMAL LOW (ref 22–32)
Calcium: 9 mg/dL (ref 8.9–10.3)
Chloride: 87 mmol/L — ABNORMAL LOW (ref 101–111)
Creatinine, Ser: 2.93 mg/dL — ABNORMAL HIGH (ref 0.44–1.00)
GFR calc Af Amer: 17 mL/min — ABNORMAL LOW (ref >60)
GFR, EST NON AFRICAN AMERICAN: 15 mL/min — AB (ref >60)
Glucose, Bld: 193 mg/dL — ABNORMAL HIGH (ref 65–99)
POTASSIUM: 4.6 mmol/L (ref 3.5–5.1)
Sodium: 122 mmol/L — ABNORMAL LOW (ref 135–145)
TOTAL PROTEIN: 7.1 g/dL (ref 6.5–8.1)

## 2017-02-20 LAB — LIPASE, BLOOD: Lipase: 16 U/L (ref 11–51)

## 2017-02-20 LAB — CBC WITH DIFFERENTIAL/PLATELET
BASOS ABS: 0 10*3/uL (ref 0.0–0.1)
Basophils Relative: 0 %
EOS PCT: 1 %
Eosinophils Absolute: 0.1 10*3/uL (ref 0.0–0.7)
HEMATOCRIT: 33 % — AB (ref 36.0–46.0)
Hemoglobin: 10.6 g/dL — ABNORMAL LOW (ref 12.0–15.0)
LYMPHS PCT: 17 %
Lymphs Abs: 1.7 10*3/uL (ref 0.7–4.0)
MCH: 27.7 pg (ref 26.0–34.0)
MCHC: 32.1 g/dL (ref 30.0–36.0)
MCV: 86.2 fL (ref 78.0–100.0)
Monocytes Absolute: 1.7 10*3/uL — ABNORMAL HIGH (ref 0.1–1.0)
Monocytes Relative: 17 %
NEUTROS ABS: 6.6 10*3/uL (ref 1.7–7.7)
Neutrophils Relative %: 65 %
PLATELETS: 197 10*3/uL (ref 150–400)
RBC: 3.83 MIL/uL — AB (ref 3.87–5.11)
RDW: 14.9 % (ref 11.5–15.5)
WBC: 9.9 10*3/uL (ref 4.0–10.5)

## 2017-02-20 LAB — LACTIC ACID, PLASMA
LACTIC ACID, VENOUS: 5.8 mmol/L — AB (ref 0.5–1.9)
LACTIC ACID, VENOUS: 4 mmol/L — AB (ref 0.5–1.9)
LACTIC ACID, VENOUS: 3.3 mmol/L — AB (ref 0.5–1.9)

## 2017-02-20 LAB — TROPONIN I: Troponin I: 0.03 ng/mL (ref <0.03)

## 2017-02-20 LAB — CBG MONITORING, ED: GLUCOSE-CAPILLARY: 182 mg/dL — AB (ref 65–99)

## 2017-02-20 LAB — I-STAT CG4 LACTIC ACID, ED: Lactic Acid, Venous: 8.97 mmol/L (ref 0.5–1.9)

## 2017-02-20 LAB — I-STAT TROPONIN, ED: TROPONIN I, POC: 0.02 ng/mL (ref 0.00–0.08)

## 2017-02-20 MED ORDER — SODIUM CHLORIDE 0.9 % IV BOLUS (SEPSIS)
1000.0000 mL | Freq: Once | INTRAVENOUS | Status: AC
  Administered 2017-02-20: 1000 mL via INTRAVENOUS

## 2017-02-20 MED ORDER — ACETAMINOPHEN 325 MG PO TABS: 650.0000 mg | ORAL_TABLET | Freq: Four times a day (QID) | ORAL | Status: DC | PRN

## 2017-02-20 MED ORDER — MAGNESIUM CITRATE PO SOLN
1.0000 | Freq: Once | ORAL | Status: AC
  Administered 2017-02-20: 1 via ORAL
  Filled 2017-02-20: qty 296

## 2017-02-20 MED ORDER — HEPARIN SODIUM (PORCINE) 5000 UNIT/ML IJ SOLN
5000.0000 [IU] | Freq: Three times a day (TID) | INTRAMUSCULAR | Status: DC
  Administered 2017-02-20 – 2017-02-23 (×8): 5000 [IU] via SUBCUTANEOUS
  Filled 2017-02-20 (×8): qty 1

## 2017-02-20 MED ORDER — SENNOSIDES-DOCUSATE SODIUM 8.6-50 MG PO TABS
2.0000 | ORAL_TABLET | Freq: Two times a day (BID) | ORAL | Status: DC
  Administered 2017-02-20 – 2017-02-23 (×6): 2 via ORAL
  Filled 2017-02-20 (×6): qty 2

## 2017-02-20 MED ORDER — BISACODYL 10 MG RE SUPP
10.0000 mg | Freq: Every day | RECTAL | Status: DC | PRN
  Administered 2017-02-21: 10 mg via RECTAL

## 2017-02-20 MED ORDER — VANCOMYCIN HCL IN DEXTROSE 1-5 GM/200ML-% IV SOLN: 1000.0000 mg | Freq: Once | INTRAVENOUS | Status: DC

## 2017-02-20 MED ORDER — VANCOMYCIN HCL 10 G IV SOLR
1750.0000 mg | Freq: Once | INTRAVENOUS | Status: AC
  Administered 2017-02-20: 1750 mg via INTRAVENOUS
  Filled 2017-02-20: qty 1750

## 2017-02-20 MED ORDER — ONDANSETRON HCL 4 MG/2ML IJ SOLN: 4.0000 mg | Freq: Four times a day (QID) | INTRAMUSCULAR | Status: DC | PRN

## 2017-02-20 MED ORDER — SODIUM CHLORIDE 0.9 % IV SOLN
INTRAVENOUS | Status: AC
  Administered 2017-02-20: 18:00:00 via INTRAVENOUS

## 2017-02-20 MED ORDER — PIPERACILLIN-TAZOBACTAM 3.375 G IVPB 30 MIN
3.3750 g | Freq: Once | INTRAVENOUS | Status: AC
  Administered 2017-02-20: 3.375 g via INTRAVENOUS
  Filled 2017-02-20: qty 50

## 2017-02-20 MED ORDER — ONDANSETRON HCL 4 MG PO TABS: 4.0000 mg | ORAL_TABLET | Freq: Four times a day (QID) | ORAL | Status: DC | PRN

## 2017-02-20 MED ORDER — ACETAMINOPHEN 650 MG RE SUPP: 650.0000 mg | Freq: Four times a day (QID) | RECTAL | Status: DC | PRN

## 2017-02-20 MED ORDER — PIPERACILLIN-TAZOBACTAM 3.375 G IVPB
3.3750 g | Freq: Three times a day (TID) | INTRAVENOUS | Status: DC
  Administered 2017-02-20 – 2017-02-21 (×3): 3.375 g via INTRAVENOUS
  Filled 2017-02-20 (×3): qty 50

## 2017-02-20 NOTE — ED Provider Notes (Signed)
[facility] EMERGENCY DEPARTMENT Provider Note   CSN: [id number] Arrival date & time: 02/20/17  4098     History   Chief Complaint Chief Complaint  Patient presents with  . Abdominal Pain    HPI Maria Pace is a 76 y.o. female.  Patient brought in from [facility].  Brought in by caretaker.  Patient with complaint of abdominal pain and fatigue for 1 week.  Also her blood pressure has been low they have held her blood pressure medicine for the past 3 days.  Patient not eating or drinking very well.  Patient not urinating very much.  Patient's past medical history is significant for bipolar disorder or schizophrenia.  Type 2 diabetes, hypertension.      Past Medical History:  Diagnosis Date  . Bipolar 1 disorder (HCC)   . Essential hypertension   . GERD (gastroesophageal reflux disease)   . Hyperlipidemia   . Irregular heart beat   . Schizophrenia (HCC)   . Type 2 diabetes mellitus [facility])     Patient Active Problem List   Diagnosis Date Noted  . Hypotension 02/20/2017  . Urinary retention 11/10/2016  . Hypertension 11/10/2016  . Diabetes mellitus without complication (HCC) 11/10/2016  . High cholesterol 11/10/2016  . Bipolar 1 disorder (HCC) 11/10/2016  . Hyponatremia 11/10/2016  . SBO (small bowel obstruction) (HCC) 11/03/2015    Past Surgical History:  Procedure Laterality Date  . YAG LASER APPLICATION Left 01/06/2015   Procedure: YAG LASER APPLICATION;  Surgeon: Jethro Bolus, MD;  Location: [facility];  Service: Ophthalmology;  Laterality: Left;    OB History    No data available       Home Medications    Prior to Admission medications   Medication Sig Start Date End Date Taking? Authorizing Provider  acetaminophen (TYLENOL) 500 MG tablet Take 500 mg by mouth every 8 (eight) hours as needed for mild pain or moderate pain.   Yes [provider]  albuterol (PROAIR HFA) 108 (90 Base) MCG/ACT inhaler Inhale 2 puffs into the lungs every 6  (six) hours as needed for wheezing or shortness of breath.   Yes [provider]  atorvastatin (LIPITOR) 10 MG tablet Take 10 mg by mouth daily.   Yes [provider]  benztropine (COGENTIN) 1 MG tablet Take 1 mg by mouth 2 (two) times daily.   Yes [provider]  Camphor-Eucalyptus-Menthol (VICKS VAPORUB) 4.7-1.2-2.6 % OINT Apply 1 application topically daily. Apply once to toenails daily   Yes [provider]  diclofenac sodium (VOLTAREN) 1 % GEL Apply 2 g topically 2 (two) times daily as needed.   Yes [provider]  docusate sodium (COLACE) 100 MG capsule Take 200 mg by mouth daily.   Yes [provider]  hydrochlorothiazide (MICROZIDE) 12.5 MG capsule Take 12.5 mg by mouth daily.   Yes [provider]  latanoprost (XALATAN) 0.005 % ophthalmic solution Place 1 drop into both eyes at bedtime.   Yes [provider]  linagliptin (TRADJENTA) 5 MG TABS tablet Take 5 mg by mouth daily.   Yes [provider]  lisinopril (PRINIVIL,ZESTRIL) 20 MG tablet Take 20 mg by mouth daily.    Yes [provider]  loratadine (CLARITIN) 10 MG tablet Take 10 mg by mouth daily.   Yes [provider]  metFORMIN (GLUCOPHAGE) 1000 MG tablet Take 1,000 mg by mouth 2 (two) times daily with a meal.   Yes [provider]  metoprolol tartrate (LOPRESSOR) 25  MG tablet Take 1 tablet (25 mg total) by mouth 2 (two) times daily. 11/09/16  Yes Jacalyn LefevreHaviland, Julie, MD  OLANZapine (ZYPREXA) 20 MG tablet Take 20 mg by mouth at bedtime.   Yes [provider]  pantoprazole (PROTONIX) 40 MG tablet Take 40 mg by mouth daily.   Yes [provider]  polyethylene glycol powder (GLYCOLAX/MIRALAX) powder Take 17 g by mouth daily as needed for mild constipation or moderate constipation.   Yes [provider]  risperiDONE (RISPERDAL) 2 MG tablet Take 2 mg by mouth 2 (two) times daily.   Yes [provider]    traZODone (DESYREL) 50 MG tablet Take 50 mg by mouth at bedtime.   Yes [provider]  Valproate Sodium (VALPROIC ACID) 250 MG/5ML SOLN Take 5 mLs by mouth 3 (three) times daily with meals.   Yes [provider]    Family History No family history on file.  Social History Social History   Tobacco Use  . Smoking status: Never Smoker  . Smokeless tobacco: Never Used  Substance Use Topics  . Alcohol use: No  . Drug use: No     Allergies   Haloperidol lactate   Review of Systems Review of Systems  Unable to perform ROS: Dementia     Physical Exam Updated Vital Signs BP 119/69   Pulse 88   Temp (!) 97.5 F (36.4 C)   Resp 18   Ht 1.626 m (5\' 4" )   Wt 86.6 kg (191 lb)   SpO2 97%   BMI 32.79 kg/m   Physical Exam  Constitutional: She appears well-developed and well-nourished. No distress.  HENT:  Head: Normocephalic and atraumatic.  Mucous membranes dry  Eyes: Conjunctivae and EOM are normal. Pupils are equal, round, and reactive to light.  Neck: Normal range of motion. Neck supple.  Cardiovascular: Normal rate and regular rhythm.  Pulmonary/Chest: Effort normal and breath sounds normal. No respiratory distress.  Abdominal: Soft. Bowel sounds are normal. She exhibits distension. There is no tenderness.  Musculoskeletal: Normal range of motion.  Neurological: She is alert.  Skin: Skin is warm.  Nursing note and vitals reviewed.    ED Treatments / Results  Labs (all labs ordered are listed, but only abnormal results are displayed) Labs Reviewed  COMPREHENSIVE METABOLIC PANEL - Abnormal; Notable for the following components:      Result Value   Sodium 122 (*)    Chloride 87 (*)    CO2 15 (*)    Glucose, Bld 193 (*)    BUN 35 (*)    Creatinine, Ser 2.93 (*)    GFR calc non Af Amer 15 (*)    GFR calc Af Amer 17 (*)    Anion gap 20 (*)    All other components within normal limits  CBC WITH DIFFERENTIAL/PLATELET - Abnormal; Notable for  the following components:   RBC 3.83 (*)    Hemoglobin 10.6 (*)    HCT 33.0 (*)    Monocytes Absolute 1.7 (*)    All other components within normal limits  TROPONIN I - Abnormal; Notable for the following components:   Troponin I 0.03 (*)    All other components within normal limits  LACTIC ACID, PLASMA - Abnormal; Notable for the following components:   Lactic Acid, Venous 5.8 (*)    All other components within normal limits  CBG MONITORING, ED - Abnormal; Notable for the following components:   Glucose-Capillary 182 (*)    All other components  within normal limits  I-STAT CG4 LACTIC ACID, ED - Abnormal; Notable for the following components:   Lactic Acid, Venous 8.97 (*)    All other components within normal limits  CULTURE, BLOOD (ROUTINE X 2)  CULTURE, BLOOD (ROUTINE X 2)  URINE CULTURE  URINALYSIS, ROUTINE W REFLEX MICROSCOPIC  LIPASE, BLOOD  I-STAT TROPONIN, ED    EKG  EKG Interpretation  Date/Time:  Monday February 20 2017 10:27:48 EST Ventricular Rate:  100 PR Interval:    QRS Duration: 92 QT Interval:  334 QTC Calculation: 431 R Axis:   -14 Text Interpretation:  Sinus tachycardia Consider anterior infarct Confirmed by Vanetta Mulders [phone]) on 02/20/2017 10:47:20 AM       Radiology Ct Abdomen Pelvis Wo Contrast  Result Date: 02/20/2017 CLINICAL DATA:  Abdominal pain EXAM: CT ABDOMEN AND PELVIS WITHOUT CONTRAST TECHNIQUE: Multidetector CT imaging of the abdomen and pelvis was performed following the standard protocol without IV contrast. COMPARISON:  11/09/2016 FINDINGS: Lower chest: Dependent atelectasis bilaterally. Hepatobiliary: Liver and gallbladder are unremarkable. Pancreas: Atrophic. Spleen: Unremarkable Adrenals/Urinary Tract: Kidneys and adrenal glands are within normal limits. Foley catheter decompresses the bladder. Stomach/Bowel: The colon is diffusely distended including the rectosigmoid. There is stool contents throughout the colon with some gas  is distension. There are also some scattered distended small bowel loops. There is no obvious transition point in the colon. The appendix is unremarkable. Stomach is decompressed. Vascular/Lymphatic: Atherosclerotic calcifications of the iliac artery's and aorta are noted without evidence of aortic aneurysm. No obvious abnormal retroperitoneal adenopathy. Reproductive: Uterus is unremarkable. Ovaries are not clearly identified. Other: There is a small amount of free fluid in the right hemipelvis. Musculoskeletal: Multilevel degenerative disc disease without significant acute compression deformity. IMPRESSION: There is diffuse distention of the colon containing fecal material without defined transition point. Large amount of stool is present. Diffuse colonic ileus is suggested. There is associated mild secondary distention of small bowel. Foley catheter now decompresses the bladder. Electronically Signed   By: Jolaine Click M.D.   On: 02/20/2017 13:44   Dg Chest Port 1 View  Result Date: 02/20/2017 CLINICAL DATA:  Sepsis, abdominal pain, fatigue EXAM: PORTABLE CHEST 1 VIEW COMPARISON:  11/09/2016 FINDINGS: Very low lung volumes. Mild elevation of the right hemidiaphragm. Cardiomegaly with vascular congestion. Right basilar atelectasis. No focal opacity on the left. No effusions or acute bony abnormality. IMPRESSION: Very low lung volumes. Elevation of the right hemidiaphragm with right base atelectasis. Cardiomegaly, vascular congestion. Electronically Signed   By: Charlett Nose M.D.   On: 02/20/2017 11:00    Procedures Procedures (including critical care time)  CRITICAL CARE Performed by: Vanetta Mulders Total critical care time: 30 minutes Critical care time was exclusive of separately billable procedures and treating other patients. Critical care was necessary to treat or prevent imminent or life-threatening deterioration. Critical care was time spent personally by me on the following activities:  development of treatment plan with patient and/or surrogate as well as nursing, discussions with consultants, evaluation of patient's response to treatment, examination of patient, obtaining history from patient or surrogate, ordering and performing treatments and interventions, ordering and review of laboratory studies, ordering and review of radiographic studies, pulse oximetry and re-evaluation of patient's condition.   Medications Ordered in ED Medications  piperacillin-tazobactam (ZOSYN) IVPB 3.375 g (not administered)  sodium chloride 0.9 % bolus 1,000 mL (0 mLs Intravenous Stopped 02/20/17 1138)    And  sodium chloride 0.9 % bolus 1,000 mL (1,000 mLs Intravenous New Bag/Given 02/20/17  1138)    And  sodium chloride 0.9 % bolus 1,000 mL (1,000 mLs Intravenous New Bag/Given 02/20/17 1212)  piperacillin-tazobactam (ZOSYN) IVPB 3.375 g (0 g Intravenous Stopped 02/20/17 1213)  vancomycin (VANCOCIN) 1,750 mg in sodium chloride 0.9 % 500 mL IVPB (1,750 mg Intravenous New Bag/Given 02/20/17 1138)     Initial Impression / Assessment and Plan / ED Course  I have reviewed the triage vital signs and the nursing notes.  Pertinent labs & imaging results that were available during my care of the patient were reviewed by me and considered in my medical decision making (see chart for details).    Patient arrived with hypotension.  Blood pressure anywhere from 60s-80s systolic.  But definitely below 90.  Not tachycardic.  Heart rate in the 90s.  No fever.  Temperature actually slightly low.  Marked elevation in lactic acid in the 8+ range.  Sepsis protocol was based on this.  Patient received full 30/kg fluid bolus.  Pressures quickly came up with fluids.  Workup without a source of infection.  However patient's sodium was low CT of abdomen showed constipation chest x-ray negative for pneumonia.  Urinalysis negative for infection.  Patient did not have a leukocytosis.  With fluids lactic acid improved  from 8-5 but is still obviously elevated.  Patient treated with broad-spectrum antibiotics as per protocol.  Blood pressure quickly improved to above 90.  Systolics now are above 100.  Renal function is either acute renal failure or prerenal picture.  Discussed with hospitalist today will admit.  We will continue sepsis protocol for now.   Final Clinical Impressions(s) / ED Diagnoses   Final diagnoses:  Acute renal failure, unspecified acute renal failure type (HCC)  Constipation, unspecified constipation type  Hyponatremia  Hypotension, unspecified hypotension type    ED Discharge Orders    None       Vanetta MuldersZackowski, Inetha Maret, MD 02/20/17 1445

## 2017-02-20 NOTE — H&P (Addendum)
History and Physical    [name]:[id number] DOB: [birth year] DOA: 02/20/2017  Referring MD/NP/PA: EDP PCP:  Patient coming from: Assisted living facility  Chief Complaint: Abdominal pain, not eating and drinking well  HPI: Maria Pace is a 76 y.o. female with medical history significant of diabetes type 2, bipolar disorder on multiple psychotropic medications, hypertension, chronic constipation was brought to the emergency room by a caretaker from a local assisted living facility due to increased fatigue, abdominal discomfort and poor by mouth intake for 1 week. Patient is a very poor historian, reports that she is constipated on a regular basis however much worse now, last bowel movement may have been 6 or 7 days ago, denies any fevers or chills. Denies any new medicines were changes in medications ED Course: Hypotensive upon arrival in the emergency room, subsequently improved after fluid boluses 2, labs notable for hyponatremia, acute renal failure with creatinine of 2.9, severe lactic acidosis-8.9 which improved to 5, about 4 hours ago. Patient not in any distress at this time  Review of Systems: As per HPI otherwise 14 point review of systems negative.   Past Medical History:  Diagnosis Date  . Bipolar 1 disorder (HCC)   . Essential hypertension   . GERD (gastroesophageal reflux disease)   . Hyperlipidemia   . Irregular heart beat   . Schizophrenia (HCC)   . Type 2 diabetes mellitus (HCC)     Past Surgical History:  Procedure Laterality Date  . YAG LASER APPLICATION Left 01/06/2015   Procedure: YAG LASER APPLICATION;  Surgeon: Jethro Bolus, MD;  Location: [facility];  Service: Ophthalmology;  Laterality: Left;     reports that  has never smoked. she has never used smokeless tobacco. She reports that she does not drink alcohol or use drugs.  Allergies  Allergen Reactions  . Haloperidol Lactate Other (See Comments)    Couldn't urinate anymore    No family history  on file.   Prior to Admission medications   Medication Sig Start Date End Date Taking? Authorizing Provider  acetaminophen (TYLENOL) 500 MG tablet Take 500 mg by mouth every 8 (eight) hours as needed for mild pain or moderate pain.   Yes [provider]  albuterol (PROAIR HFA) 108 (90 Base) MCG/ACT inhaler Inhale 2 puffs into the lungs every 6 (six) hours as needed for wheezing or shortness of breath.   Yes [provider]  atorvastatin (LIPITOR) 10 MG tablet Take 10 mg by mouth daily.   Yes [provider]  benztropine (COGENTIN) 1 MG tablet Take 1 mg by mouth 2 (two) times daily.   Yes [provider]  Camphor-Eucalyptus-Menthol (VICKS VAPORUB) 4.7-1.2-2.6 % OINT Apply 1 application topically daily. Apply once to toenails daily   Yes [provider]  diclofenac sodium (VOLTAREN) 1 % GEL Apply 2 g topically 2 (two) times daily as needed.   Yes [provider]  docusate sodium (COLACE) 100 MG capsule Take 200 mg by mouth daily.   Yes [provider]  hydrochlorothiazide (MICROZIDE) 12.5 MG capsule Take 12.5 mg by mouth daily.   Yes [provider]  latanoprost (XALATAN) 0.005 % ophthalmic solution Place 1 drop into both eyes at bedtime.   Yes [provider]  linagliptin (TRADJENTA) 5 MG TABS tablet Take 5 mg by mouth daily.   Yes [provider]  lisinopril (PRINIVIL,ZESTRIL) 20 MG tablet Take 20 mg by mouth daily.    Yes [provider]  loratadine (CLARITIN) 10  MG tablet Take 10 mg by mouth daily.   Yes [provider]  metFORMIN (GLUCOPHAGE) 1000 MG tablet Take 1,000 mg by mouth 2 (two) times daily with a meal.   Yes [provider]  metoprolol tartrate (LOPRESSOR) 25 MG tablet Take 1 tablet (25 mg total) by mouth 2 (two) times daily. 11/09/16  Yes Jacalyn LefevreHaviland, Julie, MD  OLANZapine (ZYPREXA) 20 MG tablet Take 20 mg by mouth at bedtime.   Yes [provider]  pantoprazole  (PROTONIX) 40 MG tablet Take 40 mg by mouth daily.   Yes [provider]  polyethylene glycol powder (GLYCOLAX/MIRALAX) powder Take 17 g by mouth daily as needed for mild constipation or moderate constipation.   Yes [provider]  risperiDONE (RISPERDAL) 2 MG tablet Take 2 mg by mouth 2 (two) times daily.   Yes [provider]  traZODone (DESYREL) 50 MG tablet Take 50 mg by mouth at bedtime.   Yes [provider]  Valproate Sodium (VALPROIC ACID) 250 MG/5ML SOLN Take 5 mLs by mouth 3 (three) times daily with meals.   Yes [provider]    Physical Exam: Vitals:   02/20/17 1515 02/20/17 1530 02/20/17 1545 02/20/17 1600  BP:  137/80  (!) 150/93  Pulse: 87 85 84 88  Resp: 19 16 17 18   Temp:      SpO2: 98% 97% 96% 100%  Weight:      Height:          Constitutional: Elderly chronically ill obese African-American female resting comfortably, she is somewhat dysarthric, oriented to self and partly to place only Vitals:   02/20/17 1515 02/20/17 1530 02/20/17 1545 02/20/17 1600  BP:  137/80  (!) 150/93  Pulse: 87 85 84 88  Resp: 19 16 17 18   Temp:      SpO2: 98% 97% 96% 100%  Weight:      Height:       Eyes: PERRL, lids and conjunctivae normal ENMT: Oral mucosa pink and dry Neck: normal, supple Respiratory: clear to auscultation bilaterally, no wheezing, no crackles. Normal respiratory effort. No accessory muscle use.  Cardiovascular: Regular rate and rhythm, no murmurs / rubs / gallops Abdomen: Obese, massively distended, nontender and no rigidity or rebound, bowel sounds increased Musculoskeletal: No joint deformity upper and lower extremities. Ext: Trace edema Skin: no rashes, lesions, ulcers.  Neurologic: CN 2-12 grossly intact. Sensation intact, DTR normal. Strength 5/5 in all 4.  Psychiatric: Flat affect, very slow to process information  Labs on Admission: I have personally reviewed following labs and imaging  studies  CBC: Recent Labs  Lab 02/20/17 1035  WBC 9.9  NEUTROABS 6.6  HGB 10.6*  HCT 33.0*  MCV 86.2  PLT 197   Basic Metabolic Panel: Recent Labs  Lab 02/20/17 1035  NA 122*  K 4.6  CL 87*  CO2 15*  GLUCOSE 193*  BUN 35*  CREATININE 2.93*  CALCIUM 9.0   GFR: Estimated Creatinine Clearance: 18 mL/min (A) (by C-G formula based on SCr of 2.93 mg/dL (H)). Liver Function Tests: Recent Labs  Lab 02/20/17 1035  AST 37  ALT 15  ALKPHOS 45  BILITOT 0.4  PROT 7.1  ALBUMIN 3.5   Recent Labs  Lab 02/20/17 1035  LIPASE 16   No results for input(s): AMMONIA in the last 168 hours. Coagulation Profile: No results for input(s): INR, PROTIME in the last 168 hours. Cardiac Enzymes: Recent Labs  Lab 02/20/17 1035  TROPONINI 0.03*   BNP (  last 3 results) No results for input(s): PROBNP in the last 8760 hours. HbA1C: No results for input(s): HGBA1C in the last 72 hours. CBG: Recent Labs  Lab 02/20/17 1036  GLUCAP 182*   Lipid Profile: No results for input(s): CHOL, HDL, LDLCALC, TRIG, CHOLHDL, LDLDIRECT in the last 72 hours. Thyroid Function Tests: No results for input(s): TSH, T4TOTAL, FREET4, T3FREE, THYROIDAB in the last 72 hours. Anemia Panel: No results for input(s): VITAMINB12, FOLATE, FERRITIN, TIBC, IRON, RETICCTPCT in the last 72 hours. Urine analysis:    Component Value Date/Time   COLORURINE YELLOW 02/20/2017 1043   APPEARANCEUR CLEAR 02/20/2017 1043   APPEARANCEUR Clear 10/28/2013 0457   LABSPEC 1.013 02/20/2017 1043   LABSPEC 1.005 10/28/2013 0457   PHURINE 5.0 02/20/2017 1043   GLUCOSEU NEGATIVE 02/20/2017 1043   GLUCOSEU 50 mg/dL 16/10/960408/05/2013 [id number]   HGBUR NEGATIVE 02/20/2017 1043   BILIRUBINUR NEGATIVE 02/20/2017 1043   BILIRUBINUR Negative 10/28/2013 0457   KETONESUR NEGATIVE 02/20/2017 1043   PROTEINUR NEGATIVE 02/20/2017 1043   NITRITE NEGATIVE 02/20/2017 1043   LEUKOCYTESUR NEGATIVE 02/20/2017 1043   LEUKOCYTESUR 3+ 10/28/2013 0457    Sepsis Labs: @LABRCNTIP (procalcitonin:4,lacticidven:4) ) Recent Results (from the past 240 hour(s))  Blood Culture (routine x 2)     Status: None (Preliminary result)   Collection Time: 02/20/17 10:35 AM  Result Value Ref Range Status   Specimen Description BLOOD LEFT ARM  Final   Special Requests   Final    BOTTLES DRAWN AEROBIC AND ANAEROBIC Blood Culture adequate volume   Culture PENDING  Incomplete   Report Status PENDING  Incomplete  Blood Culture (routine x 2)     Status: None (Preliminary result)   Collection Time: 02/20/17 10:59 AM  Result Value Ref Range Status   Specimen Description BLOOD RIGHT ARM  Final   Special Requests   Final    BOTTLES DRAWN AEROBIC AND ANAEROBIC Blood Culture adequate volume   Culture PENDING  Incomplete   Report Status PENDING  Incomplete     Radiological Exams on Admission: Ct Abdomen Pelvis Wo Contrast  Result Date: 02/20/2017 CLINICAL DATA:  Abdominal pain EXAM: CT ABDOMEN AND PELVIS WITHOUT CONTRAST TECHNIQUE: Multidetector CT imaging of the abdomen and pelvis was performed following the standard protocol without IV contrast. COMPARISON:  11/09/2016 FINDINGS: Lower chest: Dependent atelectasis bilaterally. Hepatobiliary: Liver and gallbladder are unremarkable. Pancreas: Atrophic. Spleen: Unremarkable Adrenals/Urinary Tract: Kidneys and adrenal glands are within normal limits. Foley catheter decompresses the bladder. Stomach/Bowel: The colon is diffusely distended including the rectosigmoid. There is stool contents throughout the colon with some gas is distension. There are also some scattered distended small bowel loops. There is no obvious transition point in the colon. The appendix is unremarkable. Stomach is decompressed. Vascular/Lymphatic: Atherosclerotic calcifications of the iliac artery's and aorta are noted without evidence of aortic aneurysm. No obvious abnormal retroperitoneal adenopathy. Reproductive: Uterus is unremarkable. Ovaries  are not clearly identified. Other: There is a small amount of free fluid in the right hemipelvis. Musculoskeletal: Multilevel degenerative disc disease without significant acute compression deformity. IMPRESSION: There is diffuse distention of the colon containing fecal material without defined transition point. Large amount of stool is present. Diffuse colonic ileus is suggested. There is associated mild secondary distention of small bowel. Foley catheter now decompresses the bladder. Electronically Signed   By: Jolaine ClickArthur  Hoss M.D.   On: 02/20/2017 13:44   Dg Chest Port 1 View  Result Date: 02/20/2017 CLINICAL DATA:  Sepsis, abdominal pain, fatigue EXAM: PORTABLE CHEST 1  VIEW COMPARISON:  11/09/2016 FINDINGS: Very low lung volumes. Mild elevation of the right hemidiaphragm. Cardiomegaly with vascular congestion. Right basilar atelectasis. No focal opacity on the left. No effusions or acute bony abnormality. IMPRESSION: Very low lung volumes. Elevation of the right hemidiaphragm with right base atelectasis. Cardiomegaly, vascular congestion. Electronically Signed   By: Charlett Nose M.D.   On: 02/20/2017 11:00    Assessment/Plan Principal Problem:  Severe Dehydration/severe lactic acidosis - Due to worsening constipation with minimal by mouth intake for about a week, worsened by lactic acidosis from metformin - Clinically do not suspect sepsis syndrome, she has no fevers or leukocytosis -Lactate clearing with hydration - CT abdomen pelvis, chest x-ray and urinalysis without any obvious source of infection - We'll could down IV fluids now status post 3 L in the emergency room already - Repeat lactic acid until clearance expect this to improve his kidney function improves - Status post empiric antibiotics in the emergency room, as long as repeat lactate is improving and blood pressure remains stable will not continue antibiotics  Acute on chronic constipation -Suspect this is related to her multiple  psychotropic medications -1 bottle mag citrate 1 now, also add Senokot and Dulcolax suppository -If no results with this will need an enema tomorrow -Cut down psychotropic meds as tolerated -She is unable to tell me if she's ever had a colonoscopy, deferred this to PCP  Acute kidney injury -On CK D 2-3, baseline creatinine around 1.4 -Hydrate and monitor -Hold lisinopril  Diabetes mellitus without complication (HCC) -Start metformin, sliding scale insulin for now    Bipolar 1 disorder (HCC) -We'll cut down risperidone and Cogentin dose  -Resume Zyprexa and valproic acid per home regimen     DVT prophylaxis: Hep SQ Code Status: Full Code Family Communication: No family at bedside  Disposition Plan: A (improved Consults called: None  Admission status: Inpatient  Zannie Cove MD [facility] Pager [phone]  If 7PM-7AM, please contact night-coverage [email or web address] Password [facility]  02/20/2017, 4:17 PM

## 2017-02-20 NOTE — Progress Notes (Addendum)
ANTIBIOTIC CONSULT NOTE - INITIAL  Pharmacy Consult for vancomycin and zosyn Indication: sepsis  Allergies  Allergen Reactions  . Haloperidol Lactate Other (See Comments)    Couldn't urinate anymore    Patient Measurements: Height: 5\' 4"  (162.6 cm) Weight: 191 lb (86.6 kg) IBW/kg (Calculated) : 54.7   Vital Signs: Temp: 97.5 F (36.4 C) (11/26 1021) BP: 92/69 (11/26 1045) Pulse Rate: 91 (11/26 1045) Intake/Output from previous day: No intake/output data recorded. Intake/Output from this shift: No intake/output data recorded.  Labs: No results for input(s): WBC, HGB, PLT, LABCREA, CREATININE in the last 72 hours. CrCl cannot be calculated (Patient's most recent lab result is older than the maximum 21 days allowed.). No results for input(s): VANCOTROUGH, VANCOPEAK, VANCORANDOM, GENTTROUGH, GENTPEAK, GENTRANDOM, TOBRATROUGH, TOBRAPEAK, TOBRARND, AMIKACINPEAK, AMIKACINTROU, AMIKACIN in the last 72 hours.   Microbiology: No results found for this or any previous visit (from the past 720 hour(s)).  Medical History: Past Medical History:  Diagnosis Date  . Bipolar 1 disorder (HCC)   . Essential hypertension   . GERD (gastroesophageal reflux disease)   . Hyperlipidemia   . Irregular heart beat   . Schizophrenia (HCC)   . Type 2 diabetes mellitus (HCC)     Medications:  See medication history Assessment: 76 yo lady to start braod spectrum antibiotics for r/o sepsis.  Her BP is low.  Labs are pending  Goal of Therapy:  Vancomycin trough level 15-20 mcg/ml  Plan:  Vancomycin 1750 mg IV x 1 Zosyn 3.375 gm ordered in the ED F/u labs for further orders  Thanks for allowing pharmacy to be a part of this patient's care.  Talbert CageLora Candas Deemer, PharmD Clinical Pharmacist 02/20/2017,10:47 AM  Addum:  CrCl ~ 18 ml/min.  Will continue zosyn 3.375 gm IV q8 hours.  F/u am labs to reorder vancomycin.

## 2017-02-20 NOTE — ED Triage Notes (Signed)
[facility] pt c/o abd pain, fatigue x 1 week. Also reports she is unsure of last time pt urinated. Some nausea. Pt abd swollen, pt weak.

## 2017-02-20 NOTE — ED Notes (Signed)
Bladder scan done prior to foley insertion and was greater than 999

## 2017-02-20 NOTE — ED Notes (Signed)
Date and time results received: 02/20/17 1135 (use smartphrase ".now" to insert current time)  Test: trop Critical Value: 0.03  Name of Provider Notified: [email or web address]   Orders Received? Or Actions Taken?: none

## 2017-02-20 NOTE — Progress Notes (Signed)
CRITICAL VALUE ALERT  Critical Value:  Lactic 3.3  Date & Time Notied:  02/20/2017 2000

## 2017-02-20 NOTE — ED Notes (Signed)
Patient transported to CT 

## 2017-02-20 NOTE — ED Notes (Signed)
Abdominal pain over a week, decreased appetite , distended belly, pt from [name], EDP at the bedside, pt has low BP, in trendelenburg

## 2017-02-20 NOTE — ED Notes (Signed)
Date and time results received: 02/20/17 1343 (use smartphrase ".now" to insert current time)  Test: lactic acid Critical Value: 5.8  Name of Provider Notified: [email or web address]  Orders Received? Or Actions Taken?:  None at this time

## 2017-02-21 ENCOUNTER — Inpatient Hospital Stay (HOSPITAL_COMMUNITY): Payer: Medicare Other

## 2017-02-21 LAB — URINE CULTURE: CULTURE: NO GROWTH

## 2017-02-21 LAB — BLOOD CULTURE ID PANEL (REFLEXED)
ACINETOBACTER BAUMANNII: NOT DETECTED
CANDIDA ALBICANS: NOT DETECTED
Candida glabrata: NOT DETECTED
Candida krusei: NOT DETECTED
Candida parapsilosis: NOT DETECTED
Candida tropicalis: NOT DETECTED
ENTEROBACTERIACEAE SPECIES: NOT DETECTED
Enterobacter cloacae complex: NOT DETECTED
Enterococcus species: NOT DETECTED
Escherichia coli: NOT DETECTED
HAEMOPHILUS INFLUENZAE: NOT DETECTED
KLEBSIELLA PNEUMONIAE: NOT DETECTED
Klebsiella oxytoca: NOT DETECTED
Listeria monocytogenes: NOT DETECTED
Methicillin resistance: NOT DETECTED
NEISSERIA MENINGITIDIS: NOT DETECTED
Proteus species: NOT DETECTED
Pseudomonas aeruginosa: NOT DETECTED
STREPTOCOCCUS AGALACTIAE: NOT DETECTED
STREPTOCOCCUS PNEUMONIAE: NOT DETECTED
STREPTOCOCCUS PYOGENES: NOT DETECTED
STREPTOCOCCUS SPECIES: NOT DETECTED
Serratia marcescens: NOT DETECTED
Staphylococcus aureus (BCID): NOT DETECTED
Staphylococcus species: DETECTED — AB

## 2017-02-21 LAB — COMPREHENSIVE METABOLIC PANEL
ALK PHOS: 38 U/L (ref 38–126)
ALT: 13 U/L — ABNORMAL LOW (ref 14–54)
ANION GAP: 10 (ref 5–15)
AST: 20 U/L (ref 15–41)
Albumin: 3.1 g/dL — ABNORMAL LOW (ref 3.5–5.0)
BILIRUBIN TOTAL: 0.5 mg/dL (ref 0.3–1.2)
BUN: 25 mg/dL — ABNORMAL HIGH (ref 6–20)
CALCIUM: 8.6 mg/dL — AB (ref 8.9–10.3)
CO2: 23 mmol/L (ref 22–32)
Chloride: 93 mmol/L — ABNORMAL LOW (ref 101–111)
Creatinine, Ser: 1.71 mg/dL — ABNORMAL HIGH (ref 0.44–1.00)
GFR calc non Af Amer: 28 mL/min — ABNORMAL LOW (ref >60)
GFR, EST AFRICAN AMERICAN: 33 mL/min — AB (ref >60)
GLUCOSE: 81 mg/dL (ref 65–99)
Potassium: 4.3 mmol/L (ref 3.5–5.1)
Sodium: 126 mmol/L — ABNORMAL LOW (ref 135–145)
TOTAL PROTEIN: 6.3 g/dL — AB (ref 6.5–8.1)

## 2017-02-21 LAB — CBC
HEMATOCRIT: 31.7 % — AB (ref 36.0–46.0)
HEMOGLOBIN: 10.5 g/dL — AB (ref 12.0–15.0)
MCH: 28.1 pg (ref 26.0–34.0)
MCHC: 33.1 g/dL (ref 30.0–36.0)
MCV: 84.8 fL (ref 78.0–100.0)
Platelets: 125 10*3/uL — ABNORMAL LOW (ref 150–400)
RBC: 3.74 MIL/uL — ABNORMAL LOW (ref 3.87–5.11)
RDW: 14.8 % (ref 11.5–15.5)
WBC: 7.7 10*3/uL (ref 4.0–10.5)

## 2017-02-21 LAB — SODIUM, URINE, RANDOM: Sodium, Ur: 85 mmol/L

## 2017-02-21 MED ORDER — ALUM & MAG HYDROXIDE-SIMETH 200-200-20 MG/5ML PO SUSP
30.0000 mL | Freq: Four times a day (QID) | ORAL | Status: DC | PRN
  Administered 2017-02-21: 30 mL via ORAL
  Filled 2017-02-21: qty 30

## 2017-02-21 MED ORDER — VANCOMYCIN HCL 10 G IV SOLR
1250.0000 mg | INTRAVENOUS | Status: DC
  Administered 2017-02-21: 1250 mg via INTRAVENOUS
  Filled 2017-02-21 (×2): qty 1250

## 2017-02-21 MED ORDER — SODIUM CHLORIDE 0.9 % IV SOLN
INTRAVENOUS | Status: AC
  Administered 2017-02-21: 20:00:00 via INTRAVENOUS

## 2017-02-21 NOTE — Progress Notes (Signed)
ANTIBIOTIC CONSULT NOTE - INITIAL  Pharmacy Consult for vancomycin Indication: sepsis, bacteremia  Allergies  Allergen Reactions  . Haloperidol Lactate Other (See Comments)    Couldn't urinate anymore    Patient Measurements: Height: 5\' 4"  (162.6 cm) Weight: 202 lb (91.6 kg) IBW/kg (Calculated) : 54.7   Vital Signs: Temp: 98.1 F (36.7 C) (11/27 0430) Temp Source: Oral (11/27 0430) BP: 122/71 (11/27 0430) Pulse Rate: 79 (11/27 0430) Intake/Output from previous day: 11/26 0701 - 11/27 0700 In: 4030 [P.O.:310; I.V.:2620; IV Piggyback:1100] Out: 3001 [Urine:3000; Stool:1] Intake/Output from this shift: Total I/O In: 360 [P.O.:360] Out: -   Labs: Recent Labs    02/20/17 1035 02/21/17 0535  WBC 9.9 7.7  HGB 10.6* 10.5*  PLT 197 125*  CREATININE 2.93* 1.71*   Estimated Creatinine Clearance: 31.7 mL/min (A) (by C-G formula based on SCr of 1.71 mg/dL (H)). No results for input(s): VANCOTROUGH, VANCOPEAK, VANCORANDOM, GENTTROUGH, GENTPEAK, GENTRANDOM, TOBRATROUGH, TOBRAPEAK, TOBRARND, AMIKACINPEAK, AMIKACINTROU, AMIKACIN in the last 72 hours.   Microbiology: Recent Results (from the past 720 hour(s))  Blood Culture (routine x 2)     Status: None (Preliminary result)   Collection Time: 02/20/17 10:35 AM  Result Value Ref Range Status   Specimen Description BLOOD LEFT ARM  Final   Special Requests   Final    BOTTLES DRAWN AEROBIC AND ANAEROBIC Blood Culture adequate volume   Culture  Setup Time   Final    GRAM POSITIVE COCCI RECOVERED FROM ANAEROBIC BOTTLE Gram Stain Report Called to,Read Back By and Verified With: MaryAnnHowerton at 1015 by  HFlynt 02/21/17 Performed at [facility] Organism ID to follow Performed at [facility], [address] [place], [place] [address]    Culture GRAM POSITIVE COCCI  Final   Report Status PENDING  Incomplete  Urine Culture     Status: None   Collection Time: 02/20/17 10:45 AM  Result Value Ref Range Status   Specimen Description URINE, CLEAN CATCH  Final   Special Requests NONE  Final   Culture   Final    NO GROWTH Performed at [facility], [address] [place], [place] [address]    Report Status 02/21/2017 FINAL  Final  Blood Culture (routine x 2)     Status: None (Preliminary result)   Collection Time: 02/20/17 10:59 AM  Result Value Ref Range Status   Specimen Description BLOOD RIGHT ARM  Final   Special Requests   Final    BOTTLES DRAWN AEROBIC AND ANAEROBIC Blood Culture adequate volume   Culture NO GROWTH < 24 HOURS  Final   Report Status PENDING  Incomplete    Medical History: Past Medical History:  Diagnosis Date  . Bipolar 1 disorder (HCC)   . Essential hypertension   . GERD (gastroesophageal reflux disease)   . Hyperlipidemia   . Irregular heart beat   . Schizophrenia (HCC)   . Type 2 diabetes mellitus (HCC)     Medications:  See medication history Assessment: 76 yo lady initially started on broad spectrum antibiotics for r/o sepsis. Antibiotics were not continued on admission as felt not to be septic.  1/2 BC gpc and antibiotics will be resumed  Goal of Therapy:  Vancomycin trough level 15-20 mcg/ml  Plan:  Vancomycin 1250 mg IV q24 hours Zosyn 3.375 gm q8 hours per MD F/u cultures, renal function and clinical course  Thanks for allowing pharmacy to be a part of this patient's care.  Talbert CageLora Oshea Percival, PharmD Clinical  Pharmacist 02/21/2017,1:14 PM

## 2017-02-21 NOTE — Progress Notes (Signed)
PROGRESS NOTE    Maria Pace  WUJ:[id number] DOB: 06/18/1940 DOA: 02/20/2017 PCP: Avon Gully, MD  Outpatient Specialists:     Brief Narrative:  Patient is a 76 y.o. female with medical history significant of diabetes type 2, bipolar disorder on multiple psychotropic medications, hypertension, chronic constipation was brought to the emergency room by a caretaker from a local assisted living facility due to increased fatigue, abdominal discomfort and poor by mouth intake for 1 week. Patient is a very poor historian, reports that she is constipated on a regular basis however much worse now, last bowel movement may have been 6 or 7 days ago, denies any fevers or chills. Denies any new medicines were changes in medications. In ED, patient was hypotensive upon arrival, subsequently improved after fluid boluses 2, labs notable for hyponatremia, acute renal failure with creatinine of 2.9, severe lactic acidosis-8.9 which improved to 5, about 4 hours ago. Patient not in any distress at this time.  02/21/17 - Blood cultures likely growing contaminants. Will DC antibiotics. KUB to assess fecal load, and will treat accordingly.  Assessment & Plan:   Principal Problem:   Severe dehydration Active Problems:   Diabetes mellitus without complication (HCC)   Bipolar 1 disorder (HCC)   Hypotension   AKI (acute kidney injury) (HCC)   Constipation   Lactic acidosis Hyponatremia    Assessment/Plan Principal Problem:  Severe Dehydration/severe lactic acidosis - Continue IVF. - Due to worsening constipation with minimal by mouth intake for about a week, worsened by lactic acidosis from metformin - Clinically do not suspect sepsis syndrome, she has no fevers or leukocytosis -Lactate clearing with hydration - CT abdomen pelvis, chest x-ray and urinalysis without any obvious source of infection - We'll could down IV fluids now status post 3 L in the emergency room already - Repeat lactic acid  until clearance expect this to improve his kidney function improves - Status post empiric antibiotics in the emergency room, as long as repeat lactate is improving and blood pressure remains stable will not continue antibiotics  Acute on chronic constipation  - Repeat KUB in am -Suspect this is related to her multiple psychotropic medications -1 bottle mag citrate 1 now, also add Senokot and Dulcolax suppository -If no results with this will need an enema tomorrow -Cut down psychotropic meds as tolerated -She is unable to tell me if she's ever had a colonoscopy, deferred this to PCP  Acute kidney injury - Improving -On CK D 2-3, baseline creatinine around 1.4  -Hydrate and monitor -Hold lisinopril  Diabetes mellitus without complication (HCC) -Start metformin, sliding scale insulin for now    Bipolar 1 disorder (HCC) -We'll cut down risperidone and Cogentin dose  -Resume Zyprexa and valproic acid per home regimen   Hyponatremia - Chronic. Monitor.   DVT prophylaxis: Hep SQ Code Status: Full Code Family Communication: No family at bedside  Disposition Plan: Will depend on hospital course.  Consultants:   None.  Procedures:   None  Antimicrobials:   DC antibiotics    Subjective: Nil new complaints.  Objective: Vitals:   02/20/17 1742 02/20/17 1751 02/20/17 2131 02/21/17 0430  BP: (!) 143/77 (!) 143/77 108/75 122/71  Pulse: 90 90 95 79  Resp: 16 16 18 18   Temp: 98.1 F (36.7 C) 98.1 F (36.7 C) 98 F (36.7 C) 98.1 F (36.7 C)  TempSrc: Oral Oral Oral Oral  SpO2: 98% 98% 98% 99%  Weight: 91.7 kg (202 lb 2.6 oz) 91.6 kg (202 lb)  Height: 5\' 4"  (1.626 m) 5\' 4"  (1.626 m)      Intake/Output Summary (Last 24 hours) at 02/21/2017 1544 Last data filed at 02/21/2017 1500 Gross per 24 hour  Intake 1895 ml  Output 1150 ml  Net 745 ml   Filed Weights   02/20/17 1020 02/20/17 1742 02/20/17 1751  Weight: 86.6 kg (191 lb) 91.7 kg (202 lb 2.6 oz) 91.6  kg (202 lb)    Examination:  General exam: Appears calm and comfortable  Respiratory system: Clear to auscultation.  Cardiovascular system: S1 & S2   Gastrointestinal system: Abdomen is obese, soft and nontender. No organomegaly or masses felt.   Central nervous system: Alert and oriented. No focal neurological deficits. Extremities: No leg edema..   Data Reviewed: I have personally reviewed following labs and imaging studies  CBC: Recent Labs  Lab 02/20/17 1035 02/21/17 0535  WBC 9.9 7.7  NEUTROABS 6.6  --   HGB 10.6* 10.5*  HCT 33.0* 31.7*  MCV 86.2 84.8  PLT 197 125*   Basic Metabolic Panel: Recent Labs  Lab 02/20/17 1035 02/21/17 0535  NA 122* 126*  K 4.6 4.3  CL 87* 93*  CO2 15* 23  GLUCOSE 193* 81  BUN 35* 25*  CREATININE 2.93* 1.71*  CALCIUM 9.0 8.6*   GFR: Estimated Creatinine Clearance: 31.7 mL/min (A) (by C-G formula based on SCr of 1.71 mg/dL (H)). Liver Function Tests: Recent Labs  Lab 02/20/17 1035 02/21/17 0535  AST 37 20  ALT 15 13*  ALKPHOS 45 38  BILITOT 0.4 0.5  PROT 7.1 6.3*  ALBUMIN 3.5 3.1*   Recent Labs  Lab 02/20/17 1035  LIPASE 16   No results for input(s): AMMONIA in the last 168 hours. Coagulation Profile: No results for input(s): INR, PROTIME in the last 168 hours. Cardiac Enzymes: Recent Labs  Lab 02/20/17 1035  TROPONINI 0.03*   BNP (last 3 results) No results for input(s): PROBNP in the last 8760 hours. HbA1C: No results for input(s): HGBA1C in the last 72 hours. CBG: Recent Labs  Lab 02/20/17 1036  GLUCAP 182*   Lipid Profile: No results for input(s): CHOL, HDL, LDLCALC, TRIG, CHOLHDL, LDLDIRECT in the last 72 hours. Thyroid Function Tests: No results for input(s): TSH, T4TOTAL, FREET4, T3FREE, THYROIDAB in the last 72 hours. Anemia Panel: No results for input(s): VITAMINB12, FOLATE, FERRITIN, TIBC, IRON, RETICCTPCT in the last 72 hours. Urine analysis:    Component Value Date/Time   COLORURINE  YELLOW 02/20/2017 1043   APPEARANCEUR CLEAR 02/20/2017 1043   APPEARANCEUR Clear 10/28/2013 0457   LABSPEC 1.013 02/20/2017 1043   LABSPEC 1.005 10/28/2013 0457   PHURINE 5.0 02/20/2017 1043   GLUCOSEU NEGATIVE 02/20/2017 1043   GLUCOSEU 50 mg/dL 16/12/9602 5409   HGBUR NEGATIVE 02/20/2017 1043   BILIRUBINUR NEGATIVE 02/20/2017 1043   BILIRUBINUR Negative 10/28/2013 0457   KETONESUR NEGATIVE 02/20/2017 1043   PROTEINUR NEGATIVE 02/20/2017 1043   NITRITE NEGATIVE 02/20/2017 1043   LEUKOCYTESUR NEGATIVE 02/20/2017 1043   LEUKOCYTESUR 3+ 10/28/2013 0457   Sepsis Labs: @LABRCNTIP (procalcitonin:4,lacticidven:4)  ) Recent Results (from the past 240 hour(s))  Blood Culture (routine x 2)     Status: None (Preliminary result)   Collection Time: 02/20/17 10:35 AM  Result Value Ref Range Status   Specimen Description BLOOD LEFT ARM  Final   Special Requests   Final    BOTTLES DRAWN AEROBIC AND ANAEROBIC Blood Culture adequate volume   Culture  Setup Time   Final    GRAM  POSITIVE COCCI RECOVERED FROM ANAEROBIC BOTTLE Gram Stain Report Called to,Read Back By and Verified With: MaryAnnHowerton at 1015 by  HFlynt 02/21/17 Performed at [facility] Organism ID to follow Performed at [facility], [address] [place], [place] [address]    Culture GRAM POSITIVE COCCI  Final   Report Status PENDING  Incomplete  Blood Culture ID Panel (Reflexed)     Status: Abnormal   Collection Time: 02/20/17 10:35 AM  Result Value Ref Range Status   Enterococcus species NOT DETECTED NOT DETECTED Final   Listeria monocytogenes NOT DETECTED NOT DETECTED Final   Staphylococcus species DETECTED (A) NOT DETECTED Final    Comment: Methicillin (oxacillin) susceptible coagulase negative staphylococcus. Possible blood culture contaminant (unless isolated from more than one blood culture draw or clinical case suggests pathogenicity). No antibiotic treatment is indicated for blood  culture  contaminants. CRITICAL RESULT CALLED TO, READ BACK BY AND VERIFIED WITH: L. Seay Pharm.D. 14:15 02/21/17 ([email or web address])    Staphylococcus aureus NOT DETECTED NOT DETECTED Final   Methicillin resistance NOT DETECTED NOT DETECTED Final   Streptococcus species NOT DETECTED NOT DETECTED Final   Streptococcus agalactiae NOT DETECTED NOT DETECTED Final   Streptococcus pneumoniae NOT DETECTED NOT DETECTED Final   Streptococcus pyogenes NOT DETECTED NOT DETECTED Final   Acinetobacter baumannii NOT DETECTED NOT DETECTED Final   Enterobacteriaceae species NOT DETECTED NOT DETECTED Final   Enterobacter cloacae complex NOT DETECTED NOT DETECTED Final   Escherichia coli NOT DETECTED NOT DETECTED Final   Klebsiella oxytoca NOT DETECTED NOT DETECTED Final   Klebsiella pneumoniae NOT DETECTED NOT DETECTED Final   Proteus species NOT DETECTED NOT DETECTED Final   Serratia marcescens NOT DETECTED NOT DETECTED Final   Haemophilus influenzae NOT DETECTED NOT DETECTED Final   Neisseria meningitidis NOT DETECTED NOT DETECTED Final   Pseudomonas aeruginosa NOT DETECTED NOT DETECTED Final   Candida albicans NOT DETECTED NOT DETECTED Final   Candida glabrata NOT DETECTED NOT DETECTED Final   Candida krusei NOT DETECTED NOT DETECTED Final   Candida parapsilosis NOT DETECTED NOT DETECTED Final   Candida tropicalis NOT DETECTED NOT DETECTED Final    Comment: Performed at [facility], [address] [place], [place] [address]  Urine Culture     Status: None   Collection Time: 02/20/17 10:45 AM  Result Value Ref Range Status   Specimen Description URINE, CLEAN CATCH  Final   Special Requests NONE  Final   Culture   Final    NO GROWTH Performed at [facility], [address] [place], [place] [address]    Report Status 02/21/2017 FINAL  Final  Blood Culture (routine x 2)     Status: None (Preliminary result)   Collection Time: 02/20/17 10:59 AM  Result Value Ref Range Status   Specimen Description  BLOOD RIGHT ARM  Final   Special Requests   Final    BOTTLES DRAWN AEROBIC AND ANAEROBIC Blood Culture adequate volume   Culture NO GROWTH < 24 HOURS  Final   Report Status PENDING  Incomplete         Radiology Studies: Ct Abdomen Pelvis Wo Contrast  Result Date: 02/20/2017 CLINICAL DATA:  Abdominal pain EXAM: CT ABDOMEN AND PELVIS WITHOUT CONTRAST TECHNIQUE: Multidetector CT imaging of the abdomen and pelvis was performed following the standard protocol without IV contrast. COMPARISON:  11/09/2016 FINDINGS: Lower chest: Dependent atelectasis bilaterally. Hepatobiliary: Liver and gallbladder are unremarkable. Pancreas: Atrophic. Spleen: Unremarkable Adrenals/Urinary Tract: Kidneys  and adrenal glands are within normal limits. Foley catheter decompresses the bladder. Stomach/Bowel: The colon is diffusely distended including the rectosigmoid. There is stool contents throughout the colon with some gas is distension. There are also some scattered distended small bowel loops. There is no obvious transition point in the colon. The appendix is unremarkable. Stomach is decompressed. Vascular/Lymphatic: Atherosclerotic calcifications of the iliac artery's and aorta are noted without evidence of aortic aneurysm. No obvious abnormal retroperitoneal adenopathy. Reproductive: Uterus is unremarkable. Ovaries are not clearly identified. Other: There is a small amount of free fluid in the right hemipelvis. Musculoskeletal: Multilevel degenerative disc disease without significant acute compression deformity. IMPRESSION: There is diffuse distention of the colon containing fecal material without defined transition point. Large amount of stool is present. Diffuse colonic ileus is suggested. There is associated mild secondary distention of small bowel. Foley catheter now decompresses the bladder. Electronically Signed   By: Jolaine ClickArthur  Hoss M.D.   On: 02/20/2017 13:44   Dg Chest Port 1 View  Result Date:  02/20/2017 CLINICAL DATA:  Sepsis, abdominal pain, fatigue EXAM: PORTABLE CHEST 1 VIEW COMPARISON:  11/09/2016 FINDINGS: Very low lung volumes. Mild elevation of the right hemidiaphragm. Cardiomegaly with vascular congestion. Right basilar atelectasis. No focal opacity on the left. No effusions or acute bony abnormality. IMPRESSION: Very low lung volumes. Elevation of the right hemidiaphragm with right base atelectasis. Cardiomegaly, vascular congestion. Electronically Signed   By: Charlett NoseKevin  Dover M.D.   On: 02/20/2017 11:00        Scheduled Meds: . heparin  5,000 Units Subcutaneous Q8H  . senna-docusate  2 tablet Oral BID   Continuous Infusions: . sodium chloride 75 mL/hr at 02/21/17 1304  . piperacillin-tazobactam (ZOSYN)  IV Stopped (02/21/17 1427)  . vancomycin Stopped (02/21/17 1515)     LOS: 1 day    Time spent: 3832 Minutes    Berton MountSylvester Jinna Weinman, MD  [facility] Pager #: [phone] 7PM-7AM contact night coverage as above

## 2017-02-22 DIAGNOSIS — N179 Acute kidney failure, unspecified: Principal | ICD-10-CM

## 2017-02-22 DIAGNOSIS — E860 Dehydration: Secondary | ICD-10-CM

## 2017-02-22 DIAGNOSIS — E871 Hypo-osmolality and hyponatremia: Secondary | ICD-10-CM

## 2017-02-22 DIAGNOSIS — E119 Type 2 diabetes mellitus without complications: Secondary | ICD-10-CM

## 2017-02-22 DIAGNOSIS — F319 Bipolar disorder, unspecified: Secondary | ICD-10-CM

## 2017-02-22 LAB — BASIC METABOLIC PANEL
ANION GAP: 7 (ref 5–15)
BUN: 11 mg/dL (ref 6–20)
CALCIUM: 8.4 mg/dL — AB (ref 8.9–10.3)
CO2: 24 mmol/L (ref 22–32)
CREATININE: 0.94 mg/dL (ref 0.44–1.00)
Chloride: 95 mmol/L — ABNORMAL LOW (ref 101–111)
GFR calc Af Amer: >60 mL/min (ref >60)
GFR calc non Af Amer: 58 mL/min — ABNORMAL LOW (ref >60)
GLUCOSE: 95 mg/dL (ref 65–99)
Potassium: 4.8 mmol/L (ref 3.5–5.1)
Sodium: 126 mmol/L — ABNORMAL LOW (ref 135–145)

## 2017-02-22 LAB — RENAL FUNCTION PANEL
Albumin: 2.7 g/dL — ABNORMAL LOW (ref 3.5–5.0)
Anion gap: 7 (ref 5–15)
BUN: 14 mg/dL (ref 6–20)
CO2: 25 mmol/L (ref 22–32)
Calcium: 8.1 mg/dL — ABNORMAL LOW (ref 8.9–10.3)
Chloride: 96 mmol/L — ABNORMAL LOW (ref 101–111)
Creatinine, Ser: 0.98 mg/dL (ref 0.44–1.00)
GFR calc Af Amer: >60 mL/min (ref >60)
GFR calc non Af Amer: 55 mL/min — ABNORMAL LOW (ref >60)
Glucose, Bld: 91 mg/dL (ref 65–99)
Phosphorus: 2.5 mg/dL (ref 2.5–4.6)
Potassium: 4.3 mmol/L (ref 3.5–5.1)
Sodium: 128 mmol/L — ABNORMAL LOW (ref 135–145)

## 2017-02-22 LAB — CULTURE, BLOOD (ROUTINE X 2): Special Requests: ADEQUATE

## 2017-02-22 LAB — CBC
HEMATOCRIT: 27.5 % — AB (ref 36.0–46.0)
Hemoglobin: 9.3 g/dL — ABNORMAL LOW (ref 12.0–15.0)
MCH: 28.1 pg (ref 26.0–34.0)
MCHC: 33.8 g/dL (ref 30.0–36.0)
MCV: 83.1 fL (ref 78.0–100.0)
Platelets: 119 10*3/uL — ABNORMAL LOW (ref 150–400)
RBC: 3.31 MIL/uL — ABNORMAL LOW (ref 3.87–5.11)
RDW: 14.4 % (ref 11.5–15.5)
WBC: 5.7 10*3/uL (ref 4.0–10.5)

## 2017-02-22 LAB — GLUCOSE, CAPILLARY: GLUCOSE-CAPILLARY: 132 mg/dL — AB (ref 65–99)

## 2017-02-22 MED ORDER — POLYETHYLENE GLYCOL 3350 17 G PO PACK
17.0000 g | PACK | Freq: Every day | ORAL | Status: DC
  Administered 2017-02-22 – 2017-02-23 (×2): 17 g via ORAL
  Filled 2017-02-22 (×2): qty 1

## 2017-02-22 MED ORDER — TRAZODONE HCL 50 MG PO TABS
50.0000 mg | ORAL_TABLET | Freq: Every day | ORAL | Status: DC
  Administered 2017-02-22: 50 mg via ORAL
  Filled 2017-02-22: qty 1

## 2017-02-22 MED ORDER — OLANZAPINE 5 MG PO TABS
20.0000 mg | ORAL_TABLET | Freq: Every day | ORAL | Status: DC
  Administered 2017-02-22: 20 mg via ORAL
  Filled 2017-02-22: qty 4

## 2017-02-22 MED ORDER — BENZTROPINE MESYLATE 1 MG PO TABS
1.0000 mg | ORAL_TABLET | Freq: Two times a day (BID) | ORAL | Status: DC
  Administered 2017-02-22 – 2017-02-23 (×2): 1 mg via ORAL
  Filled 2017-02-22 (×2): qty 1

## 2017-02-22 MED ORDER — DOCUSATE SODIUM 100 MG PO CAPS
200.0000 mg | ORAL_CAPSULE | Freq: Every day | ORAL | Status: DC
  Administered 2017-02-22 – 2017-02-23 (×2): 200 mg via ORAL
  Filled 2017-02-22 (×2): qty 2

## 2017-02-22 MED ORDER — PANTOPRAZOLE SODIUM 40 MG PO TBEC: 40.0000 mg | DELAYED_RELEASE_TABLET | Freq: Every day | ORAL | Status: DC

## 2017-02-22 MED ORDER — VALPROIC ACID 250 MG PO CAPS
250.0000 mg | ORAL_CAPSULE | Freq: Three times a day (TID) | ORAL | Status: DC
  Filled 2017-02-22 (×6): qty 1

## 2017-02-22 MED ORDER — SODIUM CHLORIDE 0.9 % IV SOLN
INTRAVENOUS | Status: DC
  Administered 2017-02-22: 23:00:00 via INTRAVENOUS

## 2017-02-22 MED ORDER — INSULIN ASPART 100 UNIT/ML ~~LOC~~ SOLN: 0.0000 [IU] | Freq: Every day | SUBCUTANEOUS | Status: DC

## 2017-02-22 MED ORDER — VALPROIC ACID 250 MG/5ML PO SOLN: 5.0000 mL | Freq: Three times a day (TID) | ORAL | Status: DC

## 2017-02-22 MED ORDER — RISPERIDONE 1 MG PO TABS
2.0000 mg | ORAL_TABLET | Freq: Two times a day (BID) | ORAL | Status: DC
  Administered 2017-02-22 – 2017-02-23 (×2): 2 mg via ORAL
  Filled 2017-02-22 (×2): qty 2

## 2017-02-22 MED ORDER — METOPROLOL TARTRATE 25 MG PO TABS
25.0000 mg | ORAL_TABLET | Freq: Two times a day (BID) | ORAL | Status: DC
  Administered 2017-02-22 – 2017-02-23 (×2): 25 mg via ORAL
  Filled 2017-02-22 (×2): qty 1

## 2017-02-22 MED ORDER — ATORVASTATIN CALCIUM 10 MG PO TABS
10.0000 mg | ORAL_TABLET | Freq: Every day | ORAL | Status: DC
  Administered 2017-02-22 – 2017-02-23 (×2): 10 mg via ORAL
  Filled 2017-02-22 (×2): qty 1

## 2017-02-22 MED ORDER — DIVALPROEX SODIUM 250 MG PO DR TAB
250.0000 mg | DELAYED_RELEASE_TABLET | Freq: Three times a day (TID) | ORAL | Status: DC
  Administered 2017-02-22 – 2017-02-23 (×3): 250 mg via ORAL
  Filled 2017-02-22 (×3): qty 1

## 2017-02-22 MED ORDER — INSULIN ASPART 100 UNIT/ML ~~LOC~~ SOLN: 0.0000 [IU] | Freq: Three times a day (TID) | SUBCUTANEOUS | Status: DC

## 2017-02-22 MED ORDER — PANTOPRAZOLE SODIUM 40 MG PO TBEC
40.0000 mg | DELAYED_RELEASE_TABLET | Freq: Every day | ORAL | Status: DC
  Administered 2017-02-22 – 2017-02-23 (×2): 40 mg via ORAL
  Filled 2017-02-22 (×2): qty 1

## 2017-02-22 NOTE — Progress Notes (Signed)
PROGRESS NOTE    Maria Pace  HKV:[id number] DOB: 11/02/1940 DOA: 02/20/2017 PCP: Avon GullyFanta, Tesfaye, MD    Brief Narrative:  Patient is a 76 y.o.femalewith medical history significant ofdiabetes type 2, bipolar disorder on multiple psychotropic medications, hypertension,chronic constipation was brought to the emergency room by a caretaker from a local assisted living facility due to increased fatigue, abdominal discomfort and poor by mouth intake for 1 week. Patient is a very poor historian,reports that she is constipated on a regular basis however much worse now,last bowel movement may have been 6 or 7 days ago,denies any fevers or chills. Denies any new medicines were changes in medications. In ED, patient was hypotensive upon arrival, subsequently improved after fluid boluses 2,labs notable for hyponatremia, acute renal failure with creatinine of 2.9, severe lactic acidosis-8.9 which improved to 5, about4 hours ago.Patient not in any distress at this time.  Assessment & Plan:   Principal Problem:   Severe dehydration Active Problems:   Diabetes mellitus without complication (HCC)   Bipolar 1 disorder (HCC)   Hypotension   AKI (acute kidney injury) (HCC)   Constipation   Lactic acidosis   1. Severe dehydration/lactic acidosis.  No obvious source of infection.  Improved with IV fluids.  Metformin currently on hold.  Lactic acid is trending down. 2. Acute kidney injury.  Related to dehydration.  ACE inhibitor currently on hold.  Creatinine back to baseline with IV fluids. 3. Diabetes.  Metformin and Tradjenta currently on hold.  Continue on sliding scale insulin. 4. Acute on chronic constipation.  Started on bowel regimen.  She is now having regular bowel movements. 5. Bipolar disorder.  Continuing outpatient psychotropics 6. Hyponatremia.  Chronic.  Continue to monitor.  Appears to be asymptomatic.  Will discontinue further hydrochlorothiazide.   DVT prophylaxis:  Heparin Code Status: Full code Family Communication: No family present Disposition Plan: Discharge back to nursing home as patient improves   Consultants:     Procedures:     Antimicrobials:       Subjective: Patient is feeling better today.  She had several bowel movements last night.  No nausea or vomiting.  Wants to advance diet.  Objective: Vitals:   02/21/17 1435 02/21/17 2100 02/22/17 0545 02/22/17 1538  BP: 109/64 130/69 117/60 134/78  Pulse: 84 81 83 84  Resp: 18 18 18 18   Temp: 98.2 F (36.8 C) 97.7 F (36.5 C) 98.6 F (37 C) 97.7 F (36.5 C)  TempSrc:  Oral Oral   SpO2: 98% 100% 96% 97%  Weight:      Height:        Intake/Output Summary (Last 24 hours) at 02/22/2017 1936 Last data filed at 02/22/2017 1400 Gross per 24 hour  Intake -  Output 3260 ml  Net -3260 ml   Filed Weights   02/20/17 1020 02/20/17 1742 02/20/17 1751  Weight: 86.6 kg (191 lb) 91.7 kg (202 lb 2.6 oz) 91.6 kg (202 lb)    Examination:  General exam: Appears calm and comfortable  Respiratory system: Clear to auscultation. Respiratory effort normal. Cardiovascular system: S1 & S2 heard, RRR. No JVD, murmurs, rubs, gallops or clicks. No pedal edema. Gastrointestinal system: Abdomen is nondistended, soft and nontender. No organomegaly or masses felt. Normal bowel sounds heard. Central nervous system: Alert and oriented. No focal neurological deficits. Extremities: Symmetric 5 x 5 power. Skin: No rashes, lesions or ulcers Psychiatry: Judgement and insight appear normal. Mood & affect appropriate.     Data Reviewed: I have personally  reviewed following labs and imaging studies  CBC: Recent Labs  Lab 02/20/17 1035 02/21/17 0535 02/22/17 1519  WBC 9.9 7.7 5.7  NEUTROABS 6.6  --   --   HGB 10.6* 10.5* 9.3*  HCT 33.0* 31.7* 27.5*  MCV 86.2 84.8 83.1  PLT 197 125* 119*   Basic Metabolic Panel: Recent Labs  Lab 02/20/17 1035 02/21/17 0535 02/22/17 1010 02/22/17 1519   NA 122* 126* 128* 126*  K 4.6 4.3 4.3 4.8  CL 87* 93* 96* 95*  CO2 15* 23 25 24   GLUCOSE 193* 81 91 95  BUN 35* 25* 14 11  CREATININE 2.93* 1.71* 0.98 0.94  CALCIUM 9.0 8.6* 8.1* 8.4*  PHOS  --   --  2.5  --    GFR: Estimated Creatinine Clearance: 57.6 mL/min (by C-G formula based on SCr of 0.94 mg/dL). Liver Function Tests: Recent Labs  Lab 02/20/17 1035 02/21/17 0535 02/22/17 1010  AST 37 20  --   ALT 15 13*  --   ALKPHOS 45 38  --   BILITOT 0.4 0.5  --   PROT 7.1 6.3*  --   ALBUMIN 3.5 3.1* 2.7*   Recent Labs  Lab 02/20/17 1035  LIPASE 16   No results for input(s): AMMONIA in the last 168 hours. Coagulation Profile: No results for input(s): INR, PROTIME in the last 168 hours. Cardiac Enzymes: Recent Labs  Lab 02/20/17 1035  TROPONINI 0.03*   BNP (last 3 results) No results for input(s): PROBNP in the last 8760 hours. HbA1C: No results for input(s): HGBA1C in the last 72 hours. CBG: Recent Labs  Lab 02/20/17 1036  GLUCAP 182*   Lipid Profile: No results for input(s): CHOL, HDL, LDLCALC, TRIG, CHOLHDL, LDLDIRECT in the last 72 hours. Thyroid Function Tests: No results for input(s): TSH, T4TOTAL, FREET4, T3FREE, THYROIDAB in the last 72 hours. Anemia Panel: No results for input(s): VITAMINB12, FOLATE, FERRITIN, TIBC, IRON, RETICCTPCT in the last 72 hours. Sepsis Labs: Recent Labs  Lab 02/20/17 1040 02/20/17 1254 02/20/17 1636 02/20/17 1859  LATICACIDVEN 8.97* 5.8* 4.0* 3.3*    Recent Results (from the past 240 hour(s))  Blood Culture (routine x 2)     Status: Abnormal   Collection Time: 02/20/17 10:35 AM  Result Value Ref Range Status   Specimen Description BLOOD LEFT ARM  Final   Special Requests   Final    BOTTLES DRAWN AEROBIC AND ANAEROBIC Blood Culture adequate volume   Culture  Setup Time   Final    GRAM POSITIVE COCCI RECOVERED FROM ANAEROBIC BOTTLE Gram Stain Report Called to,Read Back By and Verified With: MaryAnnHowerton at 1015 by   HFlynt 02/21/17 Performed at [facility]    Culture (A)  Final    STAPHYLOCOCCUS SPECIES (COAGULASE NEGATIVE) THE SIGNIFICANCE OF ISOLATING THIS ORGANISM FROM A SINGLE SET OF BLOOD CULTURES WHEN MULTIPLE SETS ARE DRAWN IS UNCERTAIN. PLEASE NOTIFY THE MICROBIOLOGY DEPARTMENT WITHIN ONE WEEK IF SPECIATION AND SENSITIVITIES ARE REQUIRED. Performed at [facility], [address] [place], [place] [address]    Report Status 02/22/2017 FINAL  Final  Blood Culture ID Panel (Reflexed)     Status: Abnormal   Collection Time: 02/20/17 10:35 AM  Result Value Ref Range Status   Enterococcus species NOT DETECTED NOT DETECTED Final   Listeria monocytogenes NOT DETECTED NOT DETECTED Final   Staphylococcus species DETECTED (A) NOT DETECTED Final    Comment: Methicillin (oxacillin) susceptible coagulase negative staphylococcus. Possible blood culture contaminant (unless isolated  from more than one blood culture draw or clinical case suggests pathogenicity). No antibiotic treatment is indicated for blood  culture contaminants. CRITICAL RESULT CALLED TO, READ BACK BY AND VERIFIED WITH: L. Seay Pharm.D. 14:15 02/21/17 ([email or web address])    Staphylococcus aureus NOT DETECTED NOT DETECTED Final   Methicillin resistance NOT DETECTED NOT DETECTED Final   Streptococcus species NOT DETECTED NOT DETECTED Final   Streptococcus agalactiae NOT DETECTED NOT DETECTED Final   Streptococcus pneumoniae NOT DETECTED NOT DETECTED Final   Streptococcus pyogenes NOT DETECTED NOT DETECTED Final   Acinetobacter baumannii NOT DETECTED NOT DETECTED Final   Enterobacteriaceae species NOT DETECTED NOT DETECTED Final   Enterobacter cloacae complex NOT DETECTED NOT DETECTED Final   Escherichia coli NOT DETECTED NOT DETECTED Final   Klebsiella oxytoca NOT DETECTED NOT DETECTED Final   Klebsiella pneumoniae NOT DETECTED NOT DETECTED Final   Proteus species NOT DETECTED NOT DETECTED Final   Serratia marcescens NOT DETECTED  NOT DETECTED Final   Haemophilus influenzae NOT DETECTED NOT DETECTED Final   Neisseria meningitidis NOT DETECTED NOT DETECTED Final   Pseudomonas aeruginosa NOT DETECTED NOT DETECTED Final   Candida albicans NOT DETECTED NOT DETECTED Final   Candida glabrata NOT DETECTED NOT DETECTED Final   Candida krusei NOT DETECTED NOT DETECTED Final   Candida parapsilosis NOT DETECTED NOT DETECTED Final   Candida tropicalis NOT DETECTED NOT DETECTED Final    Comment: Performed at [facility], [address], [place], [place] [address]  Urine Culture     Status: None   Collection Time: 02/20/17 10:45 AM  Result Value Ref Range Status   Specimen Description URINE, CLEAN CATCH  Final   Special Requests NONE  Final   Culture   Final    NO GROWTH Performed at [facility], [address] [place], [place] [address]    Report Status 02/21/2017 FINAL  Final  Blood Culture (routine x 2)     Status: None (Preliminary result)   Collection Time: 02/20/17 10:59 AM  Result Value Ref Range Status   Specimen Description BLOOD RIGHT ARM  Final   Special Requests   Final    BOTTLES DRAWN AEROBIC AND ANAEROBIC Blood Culture adequate volume   Culture NO GROWTH 2 DAYS  Final   Report Status PENDING  Incomplete  Culture, blood (routine x 2)     Status: None (Preliminary result)   Collection Time: 02/21/17  3:47 PM  Result Value Ref Range Status   Specimen Description BLOOD RIGHT HAND  Final   Special Requests   Final    BOTTLES DRAWN AEROBIC ONLY Blood Culture results may not be optimal due to an inadequate volume of blood received in culture bottles   Culture NO GROWTH < 24 HOURS  Final   Report Status PENDING  Incomplete  Culture, blood (routine x 2)     Status: None (Preliminary result)   Collection Time: 02/21/17  3:52 PM  Result Value Ref Range Status   Specimen Description BLOOD LEFT HAND  Final   Special Requests   Final    BOTTLES DRAWN AEROBIC ONLY Blood Culture results may not be  optimal due to an inadequate volume of blood received in culture bottles   Culture NO GROWTH < 24 HOURS  Final   Report Status PENDING  Incomplete         Radiology Studies: Dg Abd 1 View  Result Date: 02/21/2017 CLINICAL DATA:  Abdominal pain and constipation for 1 week. EXAM:  ABDOMEN - 1 VIEW COMPARISON:  02/20/2017 CT and 11/09/2016 radiographs FINDINGS: Mildly distended gas-filled colon again noted. No dilated small bowel loops are identified. There has been little interval change since the prior CT. No suspicious calcifications are identified. IMPRESSION: Unchanged appearance of abdomen with nonspecific bowel gas pattern of mildly distended gas-filled colon. Electronically Signed   By: Harmon Pier M.D.   On: 02/21/2017 20:17        Scheduled Meds: . heparin  5,000 Units Subcutaneous Q8H  . pantoprazole  40 mg Oral Daily  . polyethylene glycol  17 g Oral Daily  . senna-docusate  2 tablet Oral BID   Continuous Infusions:   LOS: 2 days    Time spent: 25 minutes    Erick Blinks, MD [facility] Pager [phone]  If 7PM-7AM, please contact night-coverage [email or web address] Password [facility] 02/22/2017, 7:36 PM

## 2017-02-22 NOTE — Progress Notes (Signed)
Patient Urinary catheter removed per MD orders, patient tolerated well.

## 2017-02-22 NOTE — Progress Notes (Signed)
Patient refusing morning labs.  On call MD informed via text page.  Will pass to the day time nurse to reassess.

## 2017-02-22 NOTE — Progress Notes (Signed)
Patient urinated, bladder scan redone and showed 63 ml in bladder, past in report to monitor, when patient urinates, bladder scan to make sure she doesn't need to be recatheterized.

## 2017-02-23 DIAGNOSIS — K5900 Constipation, unspecified: Secondary | ICD-10-CM

## 2017-02-23 LAB — RENAL FUNCTION PANEL
Albumin: 3.2 g/dL — ABNORMAL LOW (ref 3.5–5.0)
Anion gap: 9 (ref 5–15)
BUN: 9 mg/dL (ref 6–20)
CO2: 24 mmol/L (ref 22–32)
Calcium: 8.9 mg/dL (ref 8.9–10.3)
Chloride: 98 mmol/L — ABNORMAL LOW (ref 101–111)
Creatinine, Ser: 1.03 mg/dL — ABNORMAL HIGH (ref 0.44–1.00)
GFR calc Af Amer: >60 mL/min (ref >60)
GFR calc non Af Amer: 52 mL/min — ABNORMAL LOW (ref >60)
Glucose, Bld: 95 mg/dL (ref 65–99)
Phosphorus: 2.6 mg/dL (ref 2.5–4.6)
Potassium: 4.3 mmol/L (ref 3.5–5.1)
Sodium: 131 mmol/L — ABNORMAL LOW (ref 135–145)

## 2017-02-23 LAB — GLUCOSE, CAPILLARY: GLUCOSE-CAPILLARY: 112 mg/dL — AB (ref 65–99)

## 2017-02-23 MED ORDER — SENNOSIDES-DOCUSATE SODIUM 8.6-50 MG PO TABS: 2.0000 | ORAL_TABLET | Freq: Every day | ORAL | 0 refills | Status: DC

## 2017-02-23 MED ORDER — BISACODYL 10 MG RE SUPP: 10.0000 mg | RECTAL | 0 refills | Status: DC | PRN

## 2017-02-23 MED ORDER — MILK AND MOLASSES ENEMA
1.0000 | Freq: Once | RECTAL | Status: AC
  Administered 2017-02-23: 250 mL via RECTAL

## 2017-02-23 NOTE — Progress Notes (Addendum)
Patient given Milk of molasses enema per Dr orders, patient tolerated well, patient had flatulence, small BM at this time.

## 2017-02-23 NOTE — Discharge Summary (Signed)
Physician Discharge Summary  Maria Pace ZOX:[id number] DOB: 1940-07-05 DOA: 02/20/2017  PCP: Avon Gully, MD  Admit date: 02/20/2017 Discharge date: 02/23/2017  Admitted From: Family care home Disposition: Family care home  Recommendations for Outpatient Follow-up:  1. Follow up with PCP in 1-2 weeks 2. Please obtain BMP/CBC in one week  Home Health: Equipment/Devices:  Discharge Condition: stable CODE STATUS: full code Diet recommendation: Heart Healthy / Carb Modified   Brief/Interim Summary: Patient is a57 y.o.femalewith medical history significant ofdiabetes type 2, bipolar disorder on multiple psychotropic medications, hypertension,chronic constipation was brought to the emergency room by a caretaker from a local assisted living facility due to increased fatigue, abdominal discomfort and poor by mouth intake for 1 week. Patient is a very poor historian,reports that she is constipated on a regular basis however much worse now,last bowel movement may have been 6 or 7 days ago,denies any fevers or chills. Denies any new medicines were changes in medications. InED, patient was hypotensive upon arrival, subsequently improved after fluid boluses 2,labs notable for hyponatremia, acute renal failure with creatinine of 2.9, severe lactic acidosis-8.9 which improved to 5, about4 hours ago.Patient not in any distress at this time.    Discharge Diagnoses:  Principal Problem:   Severe dehydration Active Problems:   Diabetes mellitus without complication (HCC)   Bipolar 1 disorder (HCC)   Hypotension   AKI (acute kidney injury) (HCC)   Constipation   Lactic acidosis  1. Severe dehydration/lactic acidosis.  No obvious source of infection.  Improved with IV fluids.  Lactic acid is trending down. 2. Acute kidney injury.  Related to dehydration.  ACE inhibitor on hold. Creatinine back to baseline with IV fluids. Resume ACE inhibitor as an outpatient if renal function is  stable on follow up. 3. Diabetes. resume metformin and tradjenta on discharge. Blood sugars stable 4. Acute on chronic constipation.  Started on bowel regimen.  She is now having regular bowel movements. 5. Bipolar disorder.  Continuing outpatient psychotropics 6. Hyponatremia.  Chronic.  Appears to be asymptomatic. Improved with hydration and discontinuation of HCTZ.  Discharge Instructions  Discharge Instructions    Diet - low sodium heart healthy   Complete by:  As directed    Increase activity slowly   Complete by:  As directed      Allergies as of 02/23/2017      Reactions   Haloperidol Lactate Other (See Comments)   Couldn't urinate anymore      Medication List    STOP taking these medications   hydrochlorothiazide 12.5 MG capsule Commonly known as:  MICROZIDE   lisinopril 20 MG tablet Commonly known as:  PRINIVIL,ZESTRIL     TAKE these medications   acetaminophen 500 MG tablet Commonly known as:  TYLENOL Take 500 mg by mouth every 8 (eight) hours as needed for mild pain or moderate pain.   atorvastatin 10 MG tablet Commonly known as:  LIPITOR Take 10 mg by mouth daily.   benztropine 1 MG tablet Commonly known as:  COGENTIN Take 1 mg by mouth 2 (two) times daily.   bisacodyl 10 MG suppository Commonly known as:  DULCOLAX Place 1 suppository (10 mg total) rectally as needed for moderate constipation.   diclofenac sodium 1 % Gel Commonly known as:  VOLTAREN Apply 2 g topically 2 (two) times daily as needed.   docusate sodium 100 MG capsule Commonly known as:  COLACE Take 200 mg by mouth daily.   latanoprost 0.005 % ophthalmic solution Commonly known as:  XALATAN Place 1 drop into both eyes at bedtime.   linagliptin 5 MG Tabs tablet Commonly known as:  TRADJENTA Take 5 mg by mouth daily.   loratadine 10 MG tablet Commonly known as:  CLARITIN Take 10 mg by mouth daily.   metFORMIN 1000 MG tablet Commonly known as:  GLUCOPHAGE Take 1,000 mg by  mouth 2 (two) times daily with a meal.   metoprolol tartrate 25 MG tablet Commonly known as:  LOPRESSOR Take 1 tablet (25 mg total) by mouth 2 (two) times daily.   OLANZapine 20 MG tablet Commonly known as:  ZYPREXA Take 20 mg by mouth at bedtime.   pantoprazole 40 MG tablet Commonly known as:  PROTONIX Take 40 mg by mouth daily.   polyethylene glycol powder powder Commonly known as:  GLYCOLAX/MIRALAX Take 17 g by mouth daily as needed for mild constipation or moderate constipation.   PROAIR HFA 108 (90 Base) MCG/ACT inhaler Generic drug:  albuterol Inhale 2 puffs into the lungs every 6 (six) hours as needed for wheezing or shortness of breath.   risperiDONE 2 MG tablet Commonly known as:  RISPERDAL Take 2 mg by mouth 2 (two) times daily.   senna-docusate 8.6-50 MG tablet Commonly known as:  Senokot-S Take 2 tablets by mouth at bedtime.   traZODone 50 MG tablet Commonly known as:  DESYREL Take 50 mg by mouth at bedtime.   Valproic Acid 250 MG/5ML Soln Take 5 mLs by mouth 3 (three) times daily with meals.   VICKS VAPORUB 4.7-1.2-2.6 % Oint Apply 1 application topically daily. Apply once to toenails daily       Allergies  Allergen Reactions  . Haloperidol Lactate Other (See Comments)    Couldn't urinate anymore    Consultations:     Procedures/Studies: Ct Abdomen Pelvis Wo Contrast  Result Date: 02/20/2017 CLINICAL DATA:  Abdominal pain EXAM: CT ABDOMEN AND PELVIS WITHOUT CONTRAST TECHNIQUE: Multidetector CT imaging of the abdomen and pelvis was performed following the standard protocol without IV contrast. COMPARISON:  11/09/2016 FINDINGS: Lower chest: Dependent atelectasis bilaterally. Hepatobiliary: Liver and gallbladder are unremarkable. Pancreas: Atrophic. Spleen: Unremarkable Adrenals/Urinary Tract: Kidneys and adrenal glands are within normal limits. Foley catheter decompresses the bladder. Stomach/Bowel: The colon is diffusely distended including the  rectosigmoid. There is stool contents throughout the colon with some gas is distension. There are also some scattered distended small bowel loops. There is no obvious transition point in the colon. The appendix is unremarkable. Stomach is decompressed. Vascular/Lymphatic: Atherosclerotic calcifications of the iliac artery's and aorta are noted without evidence of aortic aneurysm. No obvious abnormal retroperitoneal adenopathy. Reproductive: Uterus is unremarkable. Ovaries are not clearly identified. Other: There is a small amount of free fluid in the right hemipelvis. Musculoskeletal: Multilevel degenerative disc disease without significant acute compression deformity. IMPRESSION: There is diffuse distention of the colon containing fecal material without defined transition point. Large amount of stool is present. Diffuse colonic ileus is suggested. There is associated mild secondary distention of small bowel. Foley catheter now decompresses the bladder. Electronically Signed   By: Jolaine Click M.D.   On: 02/20/2017 13:44   Dg Abd 1 View  Result Date: 02/21/2017 CLINICAL DATA:  Abdominal pain and constipation for 1 week. EXAM: ABDOMEN - 1 VIEW COMPARISON:  02/20/2017 CT and 11/09/2016 radiographs FINDINGS: Mildly distended gas-filled colon again noted. No dilated small bowel loops are identified. There has been little interval change since the prior CT. No suspicious calcifications are identified. IMPRESSION: Unchanged appearance of abdomen  with nonspecific bowel gas pattern of mildly distended gas-filled colon. Electronically Signed   By: Harmon PierJeffrey  Hu M.D.   On: 02/21/2017 20:17   Dg Chest Port 1 View  Result Date: 02/20/2017 CLINICAL DATA:  Sepsis, abdominal pain, fatigue EXAM: PORTABLE CHEST 1 VIEW COMPARISON:  11/09/2016 FINDINGS: Very low lung volumes. Mild elevation of the right hemidiaphragm. Cardiomegaly with vascular congestion. Right basilar atelectasis. No focal opacity on the left. No effusions  or acute bony abnormality. IMPRESSION: Very low lung volumes. Elevation of the right hemidiaphragm with right base atelectasis. Cardiomegaly, vascular congestion. Electronically Signed   By: Charlett NoseKevin  Dover M.D.   On: 02/20/2017 11:00        Subjective: Feeling better today.  No nausea or vomiting.  Having bowel movements.  Discharge Exam: Vitals:   02/22/17 2039 02/23/17 0618  BP: (!) 152/95 124/74  Pulse: (!) 131 (!) 55  Resp: 18 18  Temp: (!) 97.4 F (36.3 C) 97.7 F (36.5 C)  SpO2: 97% 96%   Vitals:   02/22/17 0545 02/22/17 1538 02/22/17 2039 02/23/17 0618  BP: 117/60 134/78 (!) 152/95 124/74  Pulse: 83 84 (!) 131 (!) 55  Resp: 18 18 18 18   Temp: 98.6 F (37 C) 97.7 F (36.5 C) (!) 97.4 F (36.3 C) 97.7 F (36.5 C)  TempSrc: Oral  Oral Oral  SpO2: 96% 97% 97% 96%  Weight:      Height:        General: Pt is alert, awake, not in acute distress Cardiovascular: RRR, S1/S2 +, no rubs, no gallops Respiratory: CTA bilaterally, no wheezing, no rhonchi Abdominal: Soft, NT, ND, bowel sounds + Extremities: no edema, no cyanosis    The results of significant diagnostics from this hospitalization (including imaging, microbiology, ancillary and laboratory) are listed below for reference.     Microbiology: Recent Results (from the past 240 hour(s))  Blood Culture (routine x 2)     Status: Abnormal   Collection Time: 02/20/17 10:35 AM  Result Value Ref Range Status   Specimen Description BLOOD LEFT ARM  Final   Special Requests   Final    BOTTLES DRAWN AEROBIC AND ANAEROBIC Blood Culture adequate volume   Culture  Setup Time   Final    GRAM POSITIVE COCCI RECOVERED FROM ANAEROBIC BOTTLE Gram Stain Report Called to,Read Back By and Verified With: MaryAnnHowerton at 1015 by  HFlynt 02/21/17 Performed at [facility]    Culture (A)  Final    STAPHYLOCOCCUS SPECIES (COAGULASE NEGATIVE) THE SIGNIFICANCE OF ISOLATING THIS ORGANISM FROM A SINGLE SET OF BLOOD CULTURES  WHEN MULTIPLE SETS ARE DRAWN IS UNCERTAIN. PLEASE NOTIFY THE MICROBIOLOGY DEPARTMENT WITHIN ONE WEEK IF SPECIATION AND SENSITIVITIES ARE REQUIRED. Performed at [facility], [address] [place], [place] [address]    Report Status 02/22/2017 FINAL  Final  Blood Culture ID Panel (Reflexed)     Status: Abnormal   Collection Time: 02/20/17 10:35 AM  Result Value Ref Range Status   Enterococcus species NOT DETECTED NOT DETECTED Final   Listeria monocytogenes NOT DETECTED NOT DETECTED Final   Staphylococcus species DETECTED (A) NOT DETECTED Final    Comment: Methicillin (oxacillin) susceptible coagulase negative staphylococcus. Possible blood culture contaminant (unless isolated from more than one blood culture draw or clinical case suggests pathogenicity). No antibiotic treatment is indicated for blood  culture contaminants. CRITICAL RESULT CALLED TO, READ BACK BY AND VERIFIED WITH: L. Seay Pharm.D. 14:15 02/21/17 ([email or web address])    Staphylococcus aureus NOT DETECTED  NOT DETECTED Final   Methicillin resistance NOT DETECTED NOT DETECTED Final   Streptococcus species NOT DETECTED NOT DETECTED Final   Streptococcus agalactiae NOT DETECTED NOT DETECTED Final   Streptococcus pneumoniae NOT DETECTED NOT DETECTED Final   Streptococcus pyogenes NOT DETECTED NOT DETECTED Final   Acinetobacter baumannii NOT DETECTED NOT DETECTED Final   Enterobacteriaceae species NOT DETECTED NOT DETECTED Final   Enterobacter cloacae complex NOT DETECTED NOT DETECTED Final   Escherichia coli NOT DETECTED NOT DETECTED Final   Klebsiella oxytoca NOT DETECTED NOT DETECTED Final   Klebsiella pneumoniae NOT DETECTED NOT DETECTED Final   Proteus species NOT DETECTED NOT DETECTED Final   Serratia marcescens NOT DETECTED NOT DETECTED Final   Haemophilus influenzae NOT DETECTED NOT DETECTED Final   Neisseria meningitidis NOT DETECTED NOT DETECTED Final   Pseudomonas aeruginosa NOT DETECTED NOT DETECTED Final   Candida  albicans NOT DETECTED NOT DETECTED Final   Candida glabrata NOT DETECTED NOT DETECTED Final   Candida krusei NOT DETECTED NOT DETECTED Final   Candida parapsilosis NOT DETECTED NOT DETECTED Final   Candida tropicalis NOT DETECTED NOT DETECTED Final    Comment: Performed at [facility], [address] [place], [place] [address]  Urine Culture     Status: None   Collection Time: 02/20/17 10:45 AM  Result Value Ref Range Status   Specimen Description URINE, CLEAN CATCH  Final   Special Requests NONE  Final   Culture   Final    NO GROWTH Performed at [facility], [address] [place], [place] [address]    Report Status 02/21/2017 FINAL  Final  Blood Culture (routine x 2)     Status: None (Preliminary result)   Collection Time: 02/20/17 10:59 AM  Result Value Ref Range Status   Specimen Description BLOOD RIGHT ARM  Final   Special Requests   Final    BOTTLES DRAWN AEROBIC AND ANAEROBIC Blood Culture adequate volume   Culture NO GROWTH 3 DAYS  Final   Report Status PENDING  Incomplete  Culture, blood (routine x 2)     Status: None (Preliminary result)   Collection Time: 02/21/17  3:47 PM  Result Value Ref Range Status   Specimen Description BLOOD RIGHT HAND  Final   Special Requests   Final    BOTTLES DRAWN AEROBIC ONLY Blood Culture results may not be optimal due to an inadequate volume of blood received in culture bottles   Culture NO GROWTH 2 DAYS  Final   Report Status PENDING  Incomplete  Culture, blood (routine x 2)     Status: None (Preliminary result)   Collection Time: 02/21/17  3:52 PM  Result Value Ref Range Status   Specimen Description BLOOD LEFT HAND  Final   Special Requests   Final    BOTTLES DRAWN AEROBIC ONLY Blood Culture results may not be optimal due to an inadequate volume of blood received in culture bottles   Culture NO GROWTH 2 DAYS  Final   Report Status PENDING  Incomplete     Labs: BNP (last 3 results) No results for input(s): BNP in  the last 8760 hours. Basic Metabolic Panel: Recent Labs  Lab 02/20/17 1035 02/21/17 0535 02/22/17 1010 02/22/17 1519 02/23/17 0825  NA 122* 126* 128* 126* 131*  K 4.6 4.3 4.3 4.8 4.3  CL 87* 93* 96* 95* 98*  CO2 15* 23 25 24 24   GLUCOSE 193* 81 91 95 95  BUN 35* 25* 14  11 9  CREATININE 2.93* 1.71* 0.98 0.94 1.03*  CALCIUM 9.0 8.6* 8.1* 8.4* 8.9  PHOS  --   --  2.5  --  2.6   Liver Function Tests: Recent Labs  Lab 02/20/17 1035 02/21/17 0535 02/22/17 1010 02/23/17 0825  AST 37 20  --   --   ALT 15 13*  --   --   ALKPHOS 45 38  --   --   BILITOT 0.4 0.5  --   --   PROT 7.1 6.3*  --   --   ALBUMIN 3.5 3.1* 2.7* 3.2*   Recent Labs  Lab 02/20/17 1035  LIPASE 16   No results for input(s): AMMONIA in the last 168 hours. CBC: Recent Labs  Lab 02/20/17 1035 02/21/17 0535 02/22/17 1519  WBC 9.9 7.7 5.7  NEUTROABS 6.6  --   --   HGB 10.6* 10.5* 9.3*  HCT 33.0* 31.7* 27.5*  MCV 86.2 84.8 83.1  PLT 197 125* 119*   Cardiac Enzymes: Recent Labs  Lab 02/20/17 1035  TROPONINI 0.03*   BNP: Invalid input(s): POCBNP CBG: Recent Labs  Lab 02/20/17 1036 02/22/17 2137 02/23/17 1200  GLUCAP 182* 132* 112*   D-Dimer No results for input(s): DDIMER in the last 72 hours. Hgb A1c No results for input(s): HGBA1C in the last 72 hours. Lipid Profile No results for input(s): CHOL, HDL, LDLCALC, TRIG, CHOLHDL, LDLDIRECT in the last 72 hours. Thyroid function studies No results for input(s): TSH, T4TOTAL, T3FREE, THYROIDAB in the last 72 hours.  Invalid input(s): FREET3 Anemia work up No results for input(s): VITAMINB12, FOLATE, FERRITIN, TIBC, IRON, RETICCTPCT in the last 72 hours. Urinalysis    Component Value Date/Time   COLORURINE YELLOW 02/20/2017 1043   APPEARANCEUR CLEAR 02/20/2017 1043   APPEARANCEUR Clear 10/28/2013 0457   LABSPEC 1.013 02/20/2017 1043   LABSPEC 1.005 10/28/2013 0457   PHURINE 5.0 02/20/2017 1043   GLUCOSEU NEGATIVE 02/20/2017 1043    GLUCOSEU 50 mg/dL 40/98/1191 4782   HGBUR NEGATIVE 02/20/2017 1043   BILIRUBINUR NEGATIVE 02/20/2017 1043   BILIRUBINUR Negative 10/28/2013 0457   KETONESUR NEGATIVE 02/20/2017 1043   PROTEINUR NEGATIVE 02/20/2017 1043   NITRITE NEGATIVE 02/20/2017 1043   LEUKOCYTESUR NEGATIVE 02/20/2017 1043   LEUKOCYTESUR 3+ 10/28/2013 0457   Sepsis Labs Invalid input(s): PROCALCITONIN,  WBC,  LACTICIDVEN Microbiology Recent Results (from the past 240 hour(s))  Blood Culture (routine x 2)     Status: Abnormal   Collection Time: 02/20/17 10:35 AM  Result Value Ref Range Status   Specimen Description BLOOD LEFT ARM  Final   Special Requests   Final    BOTTLES DRAWN AEROBIC AND ANAEROBIC Blood Culture adequate volume   Culture  Setup Time   Final    GRAM POSITIVE COCCI RECOVERED FROM ANAEROBIC BOTTLE Gram Stain Report Called to,Read Back By and Verified With: MaryAnnHowerton at 1015 by  HFlynt 02/21/17 Performed at [facility]    Culture (A)  Final    STAPHYLOCOCCUS SPECIES (COAGULASE NEGATIVE) THE SIGNIFICANCE OF ISOLATING THIS ORGANISM FROM A SINGLE SET OF BLOOD CULTURES WHEN MULTIPLE SETS ARE DRAWN IS UNCERTAIN. PLEASE NOTIFY THE MICROBIOLOGY DEPARTMENT WITHIN ONE WEEK IF SPECIATION AND SENSITIVITIES ARE REQUIRED. Performed at [facility], [address] [place], [place] [address]    Report Status 02/22/2017 FINAL  Final  Blood Culture ID Panel (Reflexed)     Status: Abnormal   Collection Time: 02/20/17 10:35 AM  Result Value Ref Range Status  Enterococcus species NOT DETECTED NOT DETECTED Final   Listeria monocytogenes NOT DETECTED NOT DETECTED Final   Staphylococcus species DETECTED (A) NOT DETECTED Final    Comment: Methicillin (oxacillin) susceptible coagulase negative staphylococcus. Possible blood culture contaminant (unless isolated from more than one blood culture draw or clinical case suggests pathogenicity). No antibiotic treatment is indicated for blood  culture  contaminants. CRITICAL RESULT CALLED TO, READ BACK BY AND VERIFIED WITH: L. Seay Pharm.D. 14:15 02/21/17 ([email or web address])    Staphylococcus aureus NOT DETECTED NOT DETECTED Final   Methicillin resistance NOT DETECTED NOT DETECTED Final   Streptococcus species NOT DETECTED NOT DETECTED Final   Streptococcus agalactiae NOT DETECTED NOT DETECTED Final   Streptococcus pneumoniae NOT DETECTED NOT DETECTED Final   Streptococcus pyogenes NOT DETECTED NOT DETECTED Final   Acinetobacter baumannii NOT DETECTED NOT DETECTED Final   Enterobacteriaceae species NOT DETECTED NOT DETECTED Final   Enterobacter cloacae complex NOT DETECTED NOT DETECTED Final   Escherichia coli NOT DETECTED NOT DETECTED Final   Klebsiella oxytoca NOT DETECTED NOT DETECTED Final   Klebsiella pneumoniae NOT DETECTED NOT DETECTED Final   Proteus species NOT DETECTED NOT DETECTED Final   Serratia marcescens NOT DETECTED NOT DETECTED Final   Haemophilus influenzae NOT DETECTED NOT DETECTED Final   Neisseria meningitidis NOT DETECTED NOT DETECTED Final   Pseudomonas aeruginosa NOT DETECTED NOT DETECTED Final   Candida albicans NOT DETECTED NOT DETECTED Final   Candida glabrata NOT DETECTED NOT DETECTED Final   Candida krusei NOT DETECTED NOT DETECTED Final   Candida parapsilosis NOT DETECTED NOT DETECTED Final   Candida tropicalis NOT DETECTED NOT DETECTED Final    Comment: Performed at [facility], [address], [place], [place] [address]  Urine Culture     Status: None   Collection Time: 02/20/17 10:45 AM  Result Value Ref Range Status   Specimen Description URINE, CLEAN CATCH  Final   Special Requests NONE  Final   Culture   Final    NO GROWTH Performed at [facility], [address] [place], [place] [address]    Report Status 02/21/2017 FINAL  Final  Blood Culture (routine x 2)     Status: None (Preliminary result)   Collection Time: 02/20/17 10:59 AM  Result Value Ref Range Status   Specimen Description  BLOOD RIGHT ARM  Final   Special Requests   Final    BOTTLES DRAWN AEROBIC AND ANAEROBIC Blood Culture adequate volume   Culture NO GROWTH 3 DAYS  Final   Report Status PENDING  Incomplete  Culture, blood (routine x 2)     Status: None (Preliminary result)   Collection Time: 02/21/17  3:47 PM  Result Value Ref Range Status   Specimen Description BLOOD RIGHT HAND  Final   Special Requests   Final    BOTTLES DRAWN AEROBIC ONLY Blood Culture results may not be optimal due to an inadequate volume of blood received in culture bottles   Culture NO GROWTH 2 DAYS  Final   Report Status PENDING  Incomplete  Culture, blood (routine x 2)     Status: None (Preliminary result)   Collection Time: 02/21/17  3:52 PM  Result Value Ref Range Status   Specimen Description BLOOD LEFT HAND  Final   Special Requests   Final    BOTTLES DRAWN AEROBIC ONLY Blood Culture results may not be optimal due to an inadequate volume of blood received in culture bottles   Culture NO GROWTH 2  DAYS  Final   Report Status PENDING  Incomplete     Time coordinating discharge: Over 30 minutes  SIGNED:   Erick Blinks, MD  [facility] 02/23/2017, 12:33 PM Pager   If 7PM-7AM, please contact night-coverage [email or web address] Password [email or web address]

## 2017-02-23 NOTE — Care Management Note (Signed)
Case Management Note  Patient Details  Name: Maria Pace MRN: [id number] Date of Birth: 02/08/1941  Subjective/Objective:    Admitted with dehydration. Chart reviewed for CM needs. [facility] She receives assistance with ADL's. Uses RW with ambulation. Per RN pt ambulated with RW in room, doing well.                  Action/Plan: [facility] No HH or DME needs noted at DC. CSW to make arrangements for return to facility.   Expected Discharge Date:  02/23/17               Expected Discharge Plan:  Assisted Living / Rest Home  In-House Referral:  Clinical Social Work  Discharge planning Services  NA  Post Acute Care Choice:  NA Choice offered to:  NA  Status of Service:  Completed, signed off   Maria Pace, Maria Vonbehren Demske, RN 02/23/2017, 12:51 PM

## 2017-02-23 NOTE — [place] FL2 (Signed)
[place] MEDICAID FL2 LEVEL OF CARE SCREENING TOOL     IDENTIFICATION  Patient Name: Maria Pace Birthdate: 1941/01/21 Sex: female Admission Date (Current Location): 02/20/2017  [facility] and [place] Number:  [employer] and Address:  [facility],  [address] [place] [address]      Provider Number: [id number]  Attending Physician Name and Address:  Erick Blinks, MD  Relative Name and Phone Number:       Current Level of Care: Hospital Recommended Level of Care: [facility] Prior Approval Number:    Date Approved/Denied:   PASRR Number:    Discharge Plan: Other (Comment)([facility])    Current Diagnoses: Patient Active Problem List   Diagnosis Date Noted  . Hypotension 02/20/2017  . Severe dehydration 02/20/2017  . AKI (acute kidney injury) (HCC) 02/20/2017  . Constipation 02/20/2017  . Lactic acidosis 02/20/2017  . Urinary retention 11/10/2016  . Hypertension 11/10/2016  . Diabetes mellitus without complication (HCC) 11/10/2016  . High cholesterol 11/10/2016  . Bipolar 1 disorder (HCC) 11/10/2016  . Hyponatremia 11/10/2016  . SBO (small bowel obstruction) (HCC) 11/03/2015    Orientation RESPIRATION BLADDER Height & Weight     Self, Situation, Place  Normal Continent Weight: 202 lb (91.6 kg) Height:  5\' 4"  (162.6 cm)  BEHAVIORAL SYMPTOMS/MOOD NEUROLOGICAL BOWEL NUTRITION STATUS      Continent Diet(Heart Healthy)  AMBULATORY STATUS COMMUNICATION OF NEEDS Skin   Limited Assist Verbally Normal                       Personal Care Assistance Level of Assistance  Bathing, Feeding, Dressing Bathing Assistance: Limited assistance Feeding assistance: Independent Dressing Assistance: Limited assistance     Functional Limitations Info  Sight, Hearing, Speech Sight Info: Adequate Hearing Info: Adequate Speech Info: Adequate    SPECIAL CARE FACTORS FREQUENCY                       Contractures Contractures Info:  Not present    Additional Factors Info  Code Status, Psychotropic, Allergies Code Status Info: Full Code Allergies Info: Haloperidol Lactate Psychotropic Info: Zyprexa, Risperdal, Desyrel         Current Medications (02/23/2017):  This is the current hospital active medication list Current Facility-Administered Medications  Medication Dose Route Frequency Provider Last Rate Last Dose  . 0.9 %  sodium chloride infusion   Intravenous Continuous Erick Blinks, MD 100 mL/hr at 02/22/17 2323    . acetaminophen (TYLENOL) tablet 650 mg  650 mg Oral Q6H PRN Zannie Cove, MD       Or  . acetaminophen (TYLENOL) suppository 650 mg  650 mg Rectal Q6H PRN Zannie Cove, MD      . alum & mag hydroxide-simeth (MAALOX/MYLANTA) 200-200-20 MG/5ML suspension 30 mL  30 mL Oral Q6H PRN Berton Mount I, MD   30 mL at 02/21/17 2008  . atorvastatin (LIPITOR) tablet 10 mg  10 mg Oral Daily Erick Blinks, MD   10 mg at 02/23/17 0834  . benztropine (COGENTIN) tablet 1 mg  1 mg Oral BID Erick Blinks, MD   1 mg at 02/23/17 0833  . bisacodyl (DULCOLAX) suppository 10 mg  10 mg Rectal Daily PRN Zannie Cove, MD   10 mg at 02/21/17 0955  . divalproex (DEPAKOTE) DR tablet 250 mg  250 mg Oral TID AC Erick Blinks, MD   250 mg at 02/23/17 1204  . docusate sodium (COLACE) capsule 200  mg  200 mg Oral Daily Erick BlinksMemon, Jehanzeb, MD   200 mg at 02/23/17 0834  . heparin injection 5,000 Units  5,000 Units Subcutaneous Q8H Zannie CoveJoseph, Preetha, MD   5,000 Units at 02/23/17 [phone]  . insulin aspart (novoLOG) injection 0-5 Units  0-5 Units Subcutaneous QHS Memon, Durward MallardJehanzeb, MD      . insulin aspart (novoLOG) injection 0-9 Units  0-9 Units Subcutaneous TID WC Memon, Jehanzeb, MD      . metoprolol tartrate (LOPRESSOR) tablet 25 mg  25 mg Oral BID Erick BlinksMemon, Jehanzeb, MD   25 mg at 02/23/17 0833  . OLANZapine (ZYPREXA) tablet 20 mg  20 mg Oral QHS Erick BlinksMemon, Jehanzeb, MD   20 mg at 02/22/17 2315  . ondansetron (ZOFRAN) tablet 4 mg  4  mg Oral Q6H PRN Zannie CoveJoseph, Preetha, MD       Or  . ondansetron [facility](ZOFRAN) injection 4 mg  4 mg Intravenous Q6H PRN Zannie CoveJoseph, Preetha, MD      . pantoprazole (PROTONIX) EC tablet 40 mg  40 mg Oral Daily Erick BlinksMemon, Jehanzeb, MD   40 mg at 02/23/17 0832  . polyethylene glycol (MIRALAX / GLYCOLAX) packet 17 g  17 g Oral Daily Erick BlinksMemon, Jehanzeb, MD   17 g at 02/23/17 0832  . risperiDONE (RISPERDAL) tablet 2 mg  2 mg Oral BID Erick BlinksMemon, Jehanzeb, MD   2 mg at 02/23/17 1008  . senna-docusate (Senokot-S) tablet 2 tablet  2 tablet Oral BID Zannie CoveJoseph, Preetha, MD   2 tablet at 02/23/17 0831  . traZODone (DESYREL) tablet 50 mg  50 mg Oral QHS Erick BlinksMemon, Jehanzeb, MD   50 mg at 02/22/17 2315     Discharge Medications: Medication List     STOP taking these medications   hydrochlorothiazide 12.5 MG capsule Commonly known as:  MICROZIDE   lisinopril 20 MG tablet Commonly known as:  PRINIVIL,ZESTRIL     TAKE these medications   acetaminophen 500 MG tablet Commonly known as:  TYLENOL Take 500 mg by mouth every 8 (eight) hours as needed for mild pain or moderate pain.   atorvastatin 10 MG tablet Commonly known as:  LIPITOR Take 10 mg by mouth daily.   benztropine 1 MG tablet Commonly known as:  COGENTIN Take 1 mg by mouth 2 (two) times daily.   bisacodyl 10 MG suppository Commonly known as:  DULCOLAX Place 1 suppository (10 mg total) rectally as needed for moderate constipation.   diclofenac sodium 1 % Gel Commonly known as:  VOLTAREN Apply 2 g topically 2 (two) times daily as needed.   docusate sodium 100 MG capsule Commonly known as:  COLACE Take 200 mg by mouth daily.   latanoprost 0.005 % ophthalmic solution Commonly known as:  XALATAN Place 1 drop into both eyes at bedtime.   linagliptin 5 MG Tabs tablet Commonly known as:  TRADJENTA Take 5 mg by mouth daily.   loratadine 10 MG tablet Commonly known as:  CLARITIN Take 10 mg by mouth daily.   metFORMIN 1000 MG tablet Commonly known  as:  GLUCOPHAGE Take 1,000 mg by mouth 2 (two) times daily with a meal.   metoprolol tartrate 25 MG tablet Commonly known as:  LOPRESSOR Take 1 tablet (25 mg total) by mouth 2 (two) times daily.   OLANZapine 20 MG tablet Commonly known as:  ZYPREXA Take 20 mg by mouth at bedtime.   pantoprazole 40 MG tablet Commonly known as:  PROTONIX Take 40 mg by mouth daily.   polyethylene glycol powder powder Commonly known  asSharon Seller:  GLYCOLAX/MIRALAX Take 17 g by mouth daily as needed for mild constipation or moderate constipation.   PROAIR HFA 108 (90 Base) MCG/ACT inhaler Generic drug:  albuterol Inhale 2 puffs into the lungs every 6 (six) hours as needed for wheezing or shortness of breath.   risperiDONE 2 MG tablet Commonly known as:  RISPERDAL Take 2 mg by mouth 2 (two) times daily.   senna-docusate 8.6-50 MG tablet Commonly known as:  Senokot-S Take 2 tablets by mouth at bedtime.   traZODone 50 MG tablet Commonly known as:  DESYREL Take 50 mg by mouth at bedtime.   Valproic Acid 250 MG/5ML Soln Take 5 mLs by mouth 3 (three) times daily with meals.   VICKS VAPORUB 4.7-1.2-2.6 % Oint Apply 1 application topically daily. Apply once to toenails daily      Relevant Imaging Results:  Relevant Lab Results:   Additional Information    Nzinga Ferran, Juleen ChinaHeather D, [name]

## 2017-02-23 NOTE — Progress Notes (Addendum)
Patient IV removed, telemetry removed, tolerated well. [facility]

## 2017-02-23 NOTE — Care Management Important Message (Signed)
Important Message  Patient Details  Name: Golden PopDorothy A Bielby MRN: [id number] Date of Birth: 07/22/1940   Medicare Important Message Given:  Yes    Malcolm MetroChildress, Kimala Horne Demske, RN 02/23/2017, 12:42 PM

## 2017-02-25 LAB — CULTURE, BLOOD (ROUTINE X 2)
CULTURE: NO GROWTH
Special Requests: ADEQUATE

## 2017-02-26 LAB — CULTURE, BLOOD (ROUTINE X 2)
Culture: NO GROWTH
Culture: NO GROWTH

## 2017-02-28 DIAGNOSIS — B351 Tinea unguium: Secondary | ICD-10-CM | POA: Diagnosis not present

## 2017-02-28 DIAGNOSIS — E1142 Type 2 diabetes mellitus with diabetic polyneuropathy: Secondary | ICD-10-CM | POA: Diagnosis not present

## 2017-02-28 DIAGNOSIS — I739 Peripheral vascular disease, unspecified: Secondary | ICD-10-CM | POA: Diagnosis not present

## 2017-03-14 DIAGNOSIS — H401131 Primary open-angle glaucoma, bilateral, mild stage: Secondary | ICD-10-CM | POA: Diagnosis not present

## 2017-03-27 DIAGNOSIS — E119 Type 2 diabetes mellitus without complications: Secondary | ICD-10-CM | POA: Diagnosis not present

## 2017-03-27 DIAGNOSIS — I10 Essential (primary) hypertension: Secondary | ICD-10-CM | POA: Diagnosis not present

## 2017-03-27 DIAGNOSIS — F203 Undifferentiated schizophrenia: Secondary | ICD-10-CM | POA: Diagnosis not present

## 2017-03-29 DIAGNOSIS — E1165 Type 2 diabetes mellitus with hyperglycemia: Secondary | ICD-10-CM | POA: Diagnosis not present

## 2017-03-29 DIAGNOSIS — F203 Undifferentiated schizophrenia: Secondary | ICD-10-CM | POA: Diagnosis not present

## 2017-03-29 DIAGNOSIS — E119 Type 2 diabetes mellitus without complications: Secondary | ICD-10-CM | POA: Diagnosis not present

## 2017-03-29 DIAGNOSIS — I10 Essential (primary) hypertension: Secondary | ICD-10-CM | POA: Diagnosis not present

## 2017-03-29 DIAGNOSIS — M1990 Unspecified osteoarthritis, unspecified site: Secondary | ICD-10-CM | POA: Diagnosis not present

## 2017-05-09 DIAGNOSIS — I739 Peripheral vascular disease, unspecified: Secondary | ICD-10-CM | POA: Diagnosis not present

## 2017-05-09 DIAGNOSIS — B351 Tinea unguium: Secondary | ICD-10-CM | POA: Diagnosis not present

## 2017-05-09 DIAGNOSIS — E1142 Type 2 diabetes mellitus with diabetic polyneuropathy: Secondary | ICD-10-CM | POA: Diagnosis not present

## 2017-05-17 ENCOUNTER — Other Ambulatory Visit (HOSPITAL_COMMUNITY): Payer: Self-pay | Admitting: Internal Medicine

## 2017-05-17 ENCOUNTER — Ambulatory Visit (HOSPITAL_COMMUNITY)
Admission: RE | Admit: 2017-05-17 | Discharge: 2017-05-17 | Disposition: A | Discharge status: Home / Self Care | Payer: Medicare Other | Source: Ambulatory Visit | Attending: Internal Medicine | Admitting: Internal Medicine

## 2017-05-17 DIAGNOSIS — R0602 Shortness of breath: Secondary | ICD-10-CM

## 2017-05-17 DIAGNOSIS — Z1389 Encounter for screening for other disorder: Secondary | ICD-10-CM | POA: Diagnosis not present

## 2017-05-17 DIAGNOSIS — I10 Essential (primary) hypertension: Secondary | ICD-10-CM | POA: Diagnosis not present

## 2017-05-17 DIAGNOSIS — Z1331 Encounter for screening for depression: Secondary | ICD-10-CM | POA: Diagnosis not present

## 2017-05-17 DIAGNOSIS — E119 Type 2 diabetes mellitus without complications: Secondary | ICD-10-CM | POA: Diagnosis not present

## 2017-05-17 DIAGNOSIS — F315 Bipolar disorder, current episode depressed, severe, with psychotic features: Secondary | ICD-10-CM | POA: Diagnosis not present

## 2017-05-18 ENCOUNTER — Encounter (HOSPITAL_COMMUNITY): Payer: Self-pay | Admitting: Cardiology

## 2017-05-18 ENCOUNTER — Other Ambulatory Visit: Payer: Self-pay

## 2017-05-18 ENCOUNTER — Emergency Department (HOSPITAL_COMMUNITY): Payer: Medicare Other

## 2017-05-18 ENCOUNTER — Emergency Department (HOSPITAL_COMMUNITY)
Admission: EM | Admit: 2017-05-18 | Discharge: 2017-05-18 | Disposition: A | Discharge status: Home / Self Care | Payer: Medicare Other | Attending: Emergency Medicine | Admitting: Emergency Medicine

## 2017-05-18 DIAGNOSIS — E669 Obesity, unspecified: Secondary | ICD-10-CM | POA: Diagnosis not present

## 2017-05-18 DIAGNOSIS — E119 Type 2 diabetes mellitus without complications: Secondary | ICD-10-CM | POA: Insufficient documentation

## 2017-05-18 DIAGNOSIS — I10 Essential (primary) hypertension: Secondary | ICD-10-CM | POA: Diagnosis not present

## 2017-05-18 DIAGNOSIS — R000 Tachycardia, unspecified: Secondary | ICD-10-CM | POA: Diagnosis not present

## 2017-05-18 DIAGNOSIS — Z7984 Long term (current) use of oral hypoglycemic drugs: Secondary | ICD-10-CM | POA: Insufficient documentation

## 2017-05-18 DIAGNOSIS — R531 Weakness: Secondary | ICD-10-CM | POA: Diagnosis not present

## 2017-05-18 DIAGNOSIS — R300 Dysuria: Secondary | ICD-10-CM | POA: Diagnosis not present

## 2017-05-18 DIAGNOSIS — Z0000 Encounter for general adult medical examination without abnormal findings: Secondary | ICD-10-CM | POA: Diagnosis not present

## 2017-05-18 DIAGNOSIS — E1165 Type 2 diabetes mellitus with hyperglycemia: Secondary | ICD-10-CM | POA: Diagnosis not present

## 2017-05-18 DIAGNOSIS — Z79899 Other long term (current) drug therapy: Secondary | ICD-10-CM | POA: Diagnosis not present

## 2017-05-18 DIAGNOSIS — F209 Schizophrenia, unspecified: Secondary | ICD-10-CM | POA: Diagnosis not present

## 2017-05-18 DIAGNOSIS — R404 Transient alteration of awareness: Secondary | ICD-10-CM | POA: Diagnosis not present

## 2017-05-18 DIAGNOSIS — R296 Repeated falls: Secondary | ICD-10-CM | POA: Diagnosis not present

## 2017-05-18 LAB — URINALYSIS, ROUTINE W REFLEX MICROSCOPIC
BILIRUBIN URINE: NEGATIVE
Glucose, UA: NEGATIVE mg/dL
HGB URINE DIPSTICK: NEGATIVE
KETONES UR: NEGATIVE mg/dL
Leukocytes, UA: NEGATIVE
Nitrite: NEGATIVE
Protein, ur: NEGATIVE mg/dL
Specific Gravity, Urine: 1.01 (ref 1.005–1.030)
pH: 6 (ref 5.0–8.0)

## 2017-05-18 LAB — COMPREHENSIVE METABOLIC PANEL
ALK PHOS: 50 U/L (ref 38–126)
ALT: 20 U/L (ref 14–54)
AST: 27 U/L (ref 15–41)
Albumin: 3.5 g/dL (ref 3.5–5.0)
Anion gap: 14 (ref 5–15)
BILIRUBIN TOTAL: 0.4 mg/dL (ref 0.3–1.2)
BUN: 12 mg/dL (ref 6–20)
CALCIUM: 8.9 mg/dL (ref 8.9–10.3)
CO2: 20 mmol/L — AB (ref 22–32)
Chloride: 97 mmol/L — ABNORMAL LOW (ref 101–111)
Creatinine, Ser: 1.17 mg/dL — ABNORMAL HIGH (ref 0.44–1.00)
GFR calc non Af Amer: 45 mL/min — ABNORMAL LOW (ref >60)
GFR, EST AFRICAN AMERICAN: 52 mL/min — AB (ref >60)
GLUCOSE: 119 mg/dL — AB (ref 65–99)
Potassium: 4 mmol/L (ref 3.5–5.1)
SODIUM: 131 mmol/L — AB (ref 135–145)
TOTAL PROTEIN: 7.4 g/dL (ref 6.5–8.1)

## 2017-05-18 LAB — RAPID URINE DRUG SCREEN, HOSP PERFORMED
AMPHETAMINES: NOT DETECTED
Barbiturates: NOT DETECTED
Benzodiazepines: NOT DETECTED
Cocaine: NOT DETECTED
Opiates: NOT DETECTED
Tetrahydrocannabinol: NOT DETECTED

## 2017-05-18 LAB — CBC WITH DIFFERENTIAL/PLATELET
Basophils Absolute: 0 10*3/uL (ref 0.0–0.1)
Basophils Relative: 0 %
EOS ABS: 0 10*3/uL (ref 0.0–0.7)
Eosinophils Relative: 0 %
HEMATOCRIT: 35.2 % — AB (ref 36.0–46.0)
HEMOGLOBIN: 11.1 g/dL — AB (ref 12.0–15.0)
LYMPHS ABS: 1.1 10*3/uL (ref 0.7–4.0)
LYMPHS PCT: 12 %
MCH: 28.3 pg (ref 26.0–34.0)
MCHC: 31.5 g/dL (ref 30.0–36.0)
MCV: 89.8 fL (ref 78.0–100.0)
MONOS PCT: 14 %
Monocytes Absolute: 1.3 10*3/uL — ABNORMAL HIGH (ref 0.1–1.0)
NEUTROS ABS: 7 10*3/uL (ref 1.7–7.7)
NEUTROS PCT: 74 %
Platelets: 154 10*3/uL (ref 150–400)
RBC: 3.92 MIL/uL (ref 3.87–5.11)
RDW: 14.9 % (ref 11.5–15.5)
WBC: 9.5 10*3/uL (ref 4.0–10.5)

## 2017-05-18 LAB — TROPONIN I: Troponin I: 0.07 ng/mL (ref <0.03)

## 2017-05-18 LAB — TSH: TSH: 2.634 u[IU]/mL (ref 0.350–4.500)

## 2017-05-18 LAB — ETHANOL

## 2017-05-18 MED ORDER — IOPAMIDOL (ISOVUE-370) INJECTION 76%
100.0000 mL | Freq: Once | INTRAVENOUS | Status: AC | PRN
  Administered 2017-05-18: 100 mL via INTRAVENOUS

## 2017-05-18 MED ORDER — ONDANSETRON HCL 4 MG/2ML IJ SOLN
4.0000 mg | Freq: Once | INTRAMUSCULAR | Status: AC
  Administered 2017-05-18: 4 mg via INTRAVENOUS

## 2017-05-18 MED ORDER — ONDANSETRON HCL 4 MG/2ML IJ SOLN
INTRAMUSCULAR | Status: AC
  Filled 2017-05-18: qty 2

## 2017-05-18 MED ORDER — SODIUM CHLORIDE 0.9 % IV BOLUS (SEPSIS)
500.0000 mL | Freq: Once | INTRAVENOUS | Status: AC
  Administered 2017-05-18: 500 mL via INTRAVENOUS

## 2017-05-18 NOTE — Discharge Instructions (Signed)
Recheck with Dr. Felecia ShellingFanta. Return to ER with new or worsening symptoms

## 2017-05-18 NOTE — ED Provider Notes (Signed)
[facility]ERGENCY DEPARTMENT Provider Note   CSN: [id number] Arrival date & time: 05/18/17  1024     History   Chief Complaint Chief Complaint  Patient presents with  . Weakness    HPI Maria Pace is a 77 y.o. female.  HPI Patient is a poor historian.  Coming from nursing home for several falls.  Patient only admits to falling yesterday evening.  Denies hitting her head or loss of consciousness.  She has had some nausea but denies abdominal pain.  No chest pain or shortness of breath.  Denies neck or back pain. Past Medical History:  Diagnosis Date  . Bipolar 1 disorder (HCC)   . Essential hypertension   . GERD (gastroesophageal reflux disease)   . Hyperlipidemia   . Irregular heart beat   . Schizophrenia (HCC)   . Type 2 diabetes mellitus [facility](HCC)     Patient Active Problem List   Diagnosis Date Noted  . Hypotension 02/20/2017  . Severe dehydration 02/20/2017  . AKI (acute kidney injury) (HCC) 02/20/2017  . Constipation 02/20/2017  . Lactic acidosis 02/20/2017  . Urinary retention 11/10/2016  . Hypertension 11/10/2016  . Diabetes mellitus without complication (HCC) 11/10/2016  . High cholesterol 11/10/2016  . Bipolar 1 disorder (HCC) 11/10/2016  . Hyponatremia 11/10/2016  . SBO (small bowel obstruction) (HCC) 11/03/2015    Past Surgical History:  Procedure Laterality Date  . YAG LASER APPLICATION Left 01/06/2015   Procedure: YAG LASER APPLICATION;  Surgeon: Jethro BolusMark Shapiro, MD;  Location: [facility];  Service: Ophthalmology;  Laterality: Left;    OB History    No data available       Home Medications    Prior to Admission medications   Medication Sig Start Date End Date Taking? Authorizing Provider  acetaminophen (TYLENOL) 500 MG tablet Take 500 mg by mouth every 8 (eight) hours as needed for mild pain or moderate pain.   Yes [provider]  albuterol (PROAIR HFA) 108 (90 Base) MCG/ACT inhaler Inhale 2 puffs into the lungs every 6 (six) hours  as needed for wheezing or shortness of breath.   Yes [provider]  atorvastatin (LIPITOR) 10 MG tablet Take 10 mg by mouth daily.   Yes [provider]  benztropine (COGENTIN) 1 MG tablet Take 1 mg by mouth 2 (two) times daily.   Yes [provider]  bisacodyl (DULCOLAX) 10 MG suppository Place 1 suppository (10 mg total) rectally as needed for moderate constipation. 02/23/17  Yes Memon, Durward MallardJehanzeb, MD  Camphor-Eucalyptus-Menthol (VICKS VAPORUB) 4.7-1.2-2.6 % OINT Apply 1 application topically daily. Apply once to toenails daily   Yes [provider]  diclofenac sodium (VOLTAREN) 1 % GEL Apply 2 g topically 2 (two) times daily as needed.   Yes [provider]  docusate sodium (COLACE) 100 MG capsule Take 200 mg by mouth daily.   Yes [provider]  latanoprost (XALATAN) 0.005 % ophthalmic solution Place 1 drop into both eyes at bedtime.   Yes [provider]  linagliptin (TRADJENTA) 5 MG TABS tablet Take 5 mg by mouth daily.   Yes [provider]  loratadine (CLARITIN) 10 MG tablet Take 10 mg by mouth daily.   Yes [provider]  metFORMIN (GLUCOPHAGE) 500 MG tablet Take 500 mg by mouth 2 (two) times daily with a meal.   Yes [provider]  metoprolol tartrate (LOPRESSOR) 50 MG tablet Take 50 mg by mouth 2 (two) times daily.   Yes [provider]  OLANZapine (ZYPREXA) 20 MG tablet Take 20 mg by mouth at bedtime.   Yes [provider]  pantoprazole (PROTONIX) 40 MG tablet Take 40 mg by mouth daily.   Yes [provider]  polyethylene glycol powder (GLYCOLAX/MIRALAX) powder Take 17 g by mouth daily as needed for mild constipation or moderate constipation.   Yes [provider]  risperiDONE (RISPERDAL) 2 MG tablet Take 2 mg by mouth 2 (two) times daily.   Yes [provider]  senna-docusate (SENOKOT-S) 8.6-50 MG tablet Take 2 tablets by mouth at bedtime. 02/23/17   Yes Erick Blinks, MD  traZODone (DESYREL) 50 MG tablet Take 50 mg by mouth at bedtime.   Yes [provider]  Valproate Sodium (VALPROIC ACID) 250 MG/5ML SOLN Take 5 mLs by mouth 3 (three) times daily with meals.   Yes [provider]    Family History History reviewed. No pertinent family history.  Social History Social History   Tobacco Use  . Smoking status: Never Smoker  . Smokeless tobacco: Never Used  Substance Use Topics  . Alcohol use: No  . Drug use: No     Allergies   Haloperidol lactate   Review of Systems Review of Systems  Constitutional: Negative for chills and fever.  HENT: Positive for congestion.   Eyes: Negative for visual disturbance.  Respiratory: Negative for cough and shortness of breath.   Cardiovascular: Negative for chest pain and leg swelling.  Gastrointestinal: Positive for nausea. Negative for abdominal pain, diarrhea and vomiting.  Genitourinary: Negative for dysuria, flank pain and frequency.  Musculoskeletal: Negative for back pain and neck pain.  Skin: Negative for rash and wound.  Neurological: Positive for dizziness. Negative for syncope, weakness, numbness and headaches.  All other systems reviewed and are negative.    Physical Exam Updated Vital Signs BP (!) 148/103   Pulse (!) 135   Temp 98 F (36.7 C) (Oral)   Resp 18   Ht 5' (1.524 m)   Wt 84.4 kg (186 lb)   SpO2 91%   BMI 36.33 kg/m   Physical Exam  Constitutional: She is oriented to person, place, and time. She appears well-developed and well-nourished. No distress.  HENT:  Head: Normocephalic and atraumatic.  Mouth/Throat: Oropharynx is clear and moist. No oropharyngeal exudate.  No evidence of scalp trauma.  No intraoral trauma.  No malocclusion.  Eyes: EOM are normal. Pupils are equal, round, and reactive to light.  No nystagmus  Neck: Normal range of motion. Neck supple.  No posterior midline cervical tenderness to palpation.    Cardiovascular: Regular rhythm.  Tachycardia  Pulmonary/Chest: Effort normal and breath sounds normal. No stridor. No respiratory distress. She has no wheezes. She has no rales. She exhibits no tenderness.  Abdominal: Soft. Bowel sounds are normal. She exhibits distension. There is no tenderness. There is no rebound and no guarding.  Musculoskeletal: Normal range of motion. She exhibits no edema or tenderness.  No midline thoracic or lumbar tenderness.  Pelvis is stable.  Full range of motion bilateral lower extremities.  Distal pulses are 2+.  Neurological: She is alert and oriented to person, place, and time.  5/5 motor in all extremities.  Sensation intact.  Fine tremor noted in bilateral hands.  Bilateral finger to nose intact.  Skin: Skin is warm and dry. Capillary refill takes less than 2 seconds. No rash noted. She is not diaphoretic. No erythema.  Psychiatric: She has a normal mood and affect. Her behavior is normal.  Nursing  note and vitals reviewed.    ED Treatments / Results  Labs (all labs ordered are listed, but only abnormal results are displayed) Labs Reviewed  CBC WITH DIFFERENTIAL/PLATELET - Abnormal; Notable for the following components:      Result Value   Hemoglobin 11.1 (*)    HCT 35.2 (*)    Monocytes Absolute 1.3 (*)    All other components within normal limits  COMPREHENSIVE METABOLIC PANEL - Abnormal; Notable for the following components:   Sodium 131 (*)    Chloride 97 (*)    CO2 20 (*)    Glucose, Bld 119 (*)    Creatinine, Ser 1.17 (*)    GFR calc non Af Amer 45 (*)    GFR calc Af Amer 52 (*)    All other components within normal limits  TROPONIN I - Abnormal; Notable for the following components:   Troponin I 0.07 (*)    All other components within normal limits  ETHANOL  RAPID URINE DRUG SCREEN, HOSP PERFORMED  TSH  URINALYSIS, ROUTINE W REFLEX MICROSCOPIC    EKG  EKG Interpretation  Date/Time:  Thursday May 18 2017 10:31:42  EST Ventricular Rate:  119 PR Interval:    QRS Duration: 113 QT Interval:  321 QTC Calculation: 452 R Axis:   59 Text Interpretation:  Sinus tachycardia Borderline intraventricular conduction delay Borderline T abnormalities, diffuse leads Confirmed by Loren Racer ([id number]) on 05/18/2017 11:50:40 AM       Radiology No results found.  Procedures Procedures (including critical care time)  Medications Ordered in ED Medications  sodium chloride 0.9 % bolus 500 mL (0 mLs Intravenous Stopped 05/18/17 1222)  ondansetron (ZOFRAN) injection 4 mg (4 mg Intravenous Given 05/18/17 1451)  iopamidol (ISOVUE-370) 76 % injection 100 mL (100 mLs Intravenous Contrast Given 05/18/17 1726)     Initial Impression / Assessment and Plan / ED Course  I have reviewed the triage vital signs and the nursing notes.  Pertinent labs & imaging results that were available during my care of the patient were reviewed by me and considered in my medical decision making (see chart for details).      Patient signed out to oncoming emergency provider pending CT angio of her chest.   Final Clinical Impressions(s) / ED Diagnoses   Final diagnoses:  Weakness    ED Discharge Orders    None       Loren Racer, MD 05/23/17 [phone]

## 2017-05-18 NOTE — ED Provider Notes (Signed)
With CTA showing no acute abnormalities.  Pulmonary nodule noted.  Will require reevaluation for this.  Maintaining 94% saturations.  Eating and drinking.  No complaints.  Will discharge back to facility.  Heart rate varies between 108 and 126.  This has been an ongoing issue for her pain for which she is undergoing treatment.  She is not dyspneic.  And denies current symptoms.   Rolland PorterJames, Orian Amberg, MD 05/18/17 1910

## 2017-05-18 NOTE — ED Notes (Signed)
Pt sats 83%.  Placed on 2 liters [place] oxygen

## 2017-05-18 NOTE — ED Notes (Signed)
CRITICAL VALUE ALERT  Critical Value:  Troponin 0.07  Date & Time Notied:  05/18/17 at 1303  Provider Notified: Dr. Ranae PalmsYelverton   Orders Received/Actions taken:

## 2017-05-18 NOTE — ED Notes (Signed)
Attempted IV access times one without success.  

## 2017-05-18 NOTE — ED Triage Notes (Addendum)
Pt here from [facility]/  Pt had 2 falls this morning.  C/o nausea and weakness.  Denies any pain.    Room air sats 91%.  Pt denies any SOB.  CBG 135.  EMS gave 4 mg Zofran IV .  Pt currently being treated for  tachycardia rate >120 for the past month.

## 2017-05-25 ENCOUNTER — Ambulatory Visit (HOSPITAL_COMMUNITY)
Admission: RE | Admit: 2017-05-25 | Discharge: 2017-05-25 | Disposition: A | Discharge status: Home / Self Care | Payer: Medicare Other | Source: Ambulatory Visit | Attending: Internal Medicine | Admitting: Internal Medicine

## 2017-05-25 DIAGNOSIS — R079 Chest pain, unspecified: Secondary | ICD-10-CM | POA: Diagnosis not present

## 2017-05-25 DIAGNOSIS — R9431 Abnormal electrocardiogram [ECG] [EKG]: Secondary | ICD-10-CM | POA: Insufficient documentation

## 2017-06-13 DIAGNOSIS — H401131 Primary open-angle glaucoma, bilateral, mild stage: Secondary | ICD-10-CM | POA: Diagnosis not present

## 2017-06-14 DIAGNOSIS — E119 Type 2 diabetes mellitus without complications: Secondary | ICD-10-CM | POA: Diagnosis not present

## 2017-06-14 DIAGNOSIS — I10 Essential (primary) hypertension: Secondary | ICD-10-CM | POA: Diagnosis not present
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
## 2017-06-22 ENCOUNTER — Ambulatory Visit (INDEPENDENT_AMBULATORY_CARE_PROVIDER_SITE_OTHER): Payer: Medicare Other | Admitting: Psychiatry

## 2017-06-22 ENCOUNTER — Encounter (INDEPENDENT_AMBULATORY_CARE_PROVIDER_SITE_OTHER): Payer: Self-pay

## 2017-06-22 ENCOUNTER — Encounter (HOSPITAL_COMMUNITY): Payer: Self-pay | Admitting: Psychiatry

## 2017-06-22 VITALS — BP 134/80 | HR 88 | Ht 60.0 in | Wt 186.0 lb

## 2017-06-22 DIAGNOSIS — E119 Type 2 diabetes mellitus without complications: Secondary | ICD-10-CM

## 2017-06-22 DIAGNOSIS — I10 Essential (primary) hypertension: Secondary | ICD-10-CM

## 2017-06-22 DIAGNOSIS — F209 Schizophrenia, unspecified: Secondary | ICD-10-CM

## 2017-06-22 DIAGNOSIS — Z560 Unemployment, unspecified: Secondary | ICD-10-CM | POA: Diagnosis not present

## 2017-06-22 MED ORDER — OLANZAPINE 10 MG PO TABS: 10.0000 mg | ORAL_TABLET | Freq: Every day | ORAL | 0 refills | Status: DC

## 2017-06-22 MED ORDER — DIVALPROEX SODIUM ER 250 MG PO TB24: 750.0000 mg | ORAL_TABLET | Freq: Every day | ORAL | 1 refills | Status: DC

## 2017-06-22 MED ORDER — BENZTROPINE MESYLATE 1 MG PO TABS: 1.0000 mg | ORAL_TABLET | Freq: Two times a day (BID) | ORAL | 0 refills | Status: DC

## 2017-06-22 MED ORDER — RISPERIDONE 2 MG PO TABS: 2.0000 mg | ORAL_TABLET | Freq: Two times a day (BID) | ORAL | 0 refills | Status: DC

## 2017-06-22 NOTE — Patient Instructions (Signed)
1. Change to valproic acid 750 mg at night (on 3/28, take 500 mg at night) 2. Continue risperidone 2 mg twice a day, olanzapine 10 mg at night  3. Continue cogentin 1 mg twice a day 4. Return to clinic in may for 30 mins 5. Obtain blood test 4/3

## 2017-06-28 DIAGNOSIS — F209 Schizophrenia, unspecified: Secondary | ICD-10-CM | POA: Diagnosis not present

## 2017-06-29 ENCOUNTER — Encounter (HOSPITAL_COMMUNITY): Payer: Self-pay | Admitting: Psychiatry

## 2017-06-29 LAB — COMPREHENSIVE METABOLIC PANEL
AG RATIO: 1.2 (calc) (ref 1.0–2.5)
ALT: 8 U/L (ref 6–29)
AST: 10 U/L (ref 10–35)
Albumin: 3.7 g/dL (ref 3.6–5.1)
Alkaline phosphatase (APISO): 65 U/L (ref 33–130)
BILIRUBIN TOTAL: 0.2 mg/dL (ref 0.2–1.2)
BUN/Creatinine Ratio: 10 (calc) (ref 6–22)
BUN: 10 mg/dL (ref 7–25)
CALCIUM: 9.2 mg/dL (ref 8.6–10.4)
CO2: 26 mmol/L (ref 20–32)
Chloride: 100 mmol/L (ref 98–110)
Creat: 1.03 mg/dL — ABNORMAL HIGH (ref 0.60–0.93)
Globulin: 3.1 g/dL (calc) (ref 1.9–3.7)
Glucose, Bld: 102 mg/dL (ref 65–139)
Potassium: 4.2 mmol/L (ref 3.5–5.3)
Sodium: 135 mmol/L (ref 135–146)
Total Protein: 6.8 g/dL (ref 6.1–8.1)

## 2017-06-29 LAB — VALPROIC ACID LEVEL: Valproic Acid Lvl: 76.2 mg/L (ref 50.0–100.0)

## 2017-07-04 ENCOUNTER — Other Ambulatory Visit (HOSPITAL_COMMUNITY): Payer: Self-pay | Admitting: Psychiatry

## 2017-07-04 MED ORDER — TRAZODONE HCL 50 MG PO TABS: 50.0000 mg | ORAL_TABLET | Freq: Every day | ORAL | 0 refills | Status: DC

## 2017-07-11 DIAGNOSIS — F315 Bipolar disorder, current episode depressed, severe, with psychotic features: Secondary | ICD-10-CM | POA: Diagnosis not present

## 2017-07-11 DIAGNOSIS — E119 Type 2 diabetes mellitus without complications: Secondary | ICD-10-CM | POA: Diagnosis not present

## 2017-07-11 DIAGNOSIS — I10 Essential (primary) hypertension: Secondary | ICD-10-CM | POA: Diagnosis not present

## 2017-08-03 ENCOUNTER — Other Ambulatory Visit (HOSPITAL_COMMUNITY): Payer: Self-pay | Admitting: Psychiatry

## 2017-08-07 ENCOUNTER — Other Ambulatory Visit (HOSPITAL_COMMUNITY): Payer: Self-pay | Admitting: Psychiatry

## 2017-08-07 MED ORDER — TRAZODONE HCL 50 MG PO TABS: 50.0000 mg | ORAL_TABLET | Freq: Every day | ORAL | 0 refills | Status: DC

## 2017-08-10 DIAGNOSIS — I10 Essential (primary) hypertension: Secondary | ICD-10-CM | POA: Diagnosis not present

## 2017-08-10 DIAGNOSIS — E1165 Type 2 diabetes mellitus with hyperglycemia: Secondary | ICD-10-CM | POA: Diagnosis not present

## 2017-08-10 NOTE — Progress Notes (Signed)
BH MD/PA/NP OP Progress Note  08/15/2017 11:08 AM Maria Pace  MRN:  [id number]  Chief Complaint:  Chief Complaint    Follow-up; Schizophrenia     HPI:  Patient presents for follow-up appointment for schizophrenia. She has been doing well. Although she was unable to elaborate the even on Easter, she agrees that she did cook out when the staff helped her to provide the history. She "look at bed, Mr. Manson Passey (on TV show) " when she is asked about daily routine. She enjoys doing crochet. She does not remember if she had visitor lately (her sister visited last month). She has a full of book on her bed. She denies feeling depressed or anxiety. She is unsure if she sleeps well. She denies Si, HI, AH, VH. She denies paranoia.   Per staff, Louie tends to follow other peers at home. Liborio Nixon, another peer who brings around toilet paper. Rufus has started to do similar things, although she has never done it (Kawehi states "go to mess," "I don't mind changing the paper" when the staff is providing this story). She tries to watch a show in the living room while other peers are not interested in that program. Delenn tends to talk to herself in her room.  Although she is advised to go to senior center, Quetzali complains of knee pain. She also tells the staff that the other peer does not go there. No significant behavioral issues. She sleeps better after change in Depakote regimen.   Wt Readings from Last 3 Encounters:  08/15/17 182 lb (82.6 kg)  06/22/17 186 lb (84.4 kg)  05/18/17 186 lb (84.4 kg)     Visit Diagnosis:    ICD-10-CM   1. Schizophrenia, unspecified type (HCC) F20.9     Past Psychiatric History:  I have reviewed the patient's psychiatry history in detail and updated the patient record. Outpatient: used to be seen at [facility] (diagnosed with schizophrenia early in her life per chart review) Psychiatry admission: [facility] in 10/2013. Per chart, ""She was petitioned by her family, her 35  year old aunt with whom she lives, for behaving strangely, talking to herself, throwing her medicines out, putting a spell on Dr. Elesa Massed, who is a psychiatrist. In the Emergency Room, the patient has been talking constantly, apparently delusional, paranoid and grandiose, telling me that the Holy Ghost cured her." , admitted in [place] in the past.  Previous suicide attempt: denies  Past trials of medication: Depakote, risperidone, olanzapine History of violence: denies   Past Medical History:  Past Medical History:  Diagnosis Date  . Bipolar 1 disorder (HCC)   . Essential hypertension   . GERD (gastroesophageal reflux disease)   . Hyperlipidemia   . Irregular heart beat   . Schizophrenia (HCC)   . Type 2 diabetes mellitus (HCC)     Past Surgical History:  Procedure Laterality Date  . YAG LASER APPLICATION Left 01/06/2015   Procedure: YAG LASER APPLICATION;  Surgeon: Jethro Bolus, MD;  Location: [facility];  Service: Ophthalmology;  Laterality: Left;    Family Psychiatric History: I have reviewed the patient's family history in detail and updated the patient record.  Family History: No family history on file.  Social History:  Social History   Socioeconomic History  . Marital status: Single    Spouse name: Not on file  . Number of children: Not on file  . Years of education: Not on file  . Highest education level: Not on file  Occupational History  .  Not on file  Social Needs  . Financial resource strain: Not on file  . Food insecurity:    Worry: Not on file    Inability: Not on file  . Transportation needs:    Medical: Not on file    Non-medical: Not on file  Tobacco Use  . Smoking status: Never Smoker  . Smokeless tobacco: Never Used  Substance and Sexual Activity  . Alcohol use: No  . Drug use: No  . Sexual activity: Never  Lifestyle  . Physical activity:    Days per week: Not on file    Minutes per session: Not on file  . Stress: Not on file  Relationships  .  Social connections:    Talks on phone: Not on file    Gets together: Not on file    Attends religious service: Not on file    Active member of club or organization: Not on file    Attends meetings of clubs or organizations: Not on file    Relationship status: Not on file  Other Topics Concern  . Not on file  Social History Narrative  . Not on file    Allergies:  Allergies  Allergen Reactions  . Haloperidol Lactate Other (See Comments)    Couldn't urinate anymore    Metabolic Disorder Labs: Lab Results  Component Value Date   HGBA1C 11.0 (H) 10/28/2013   No results found for: PROLACTIN Lab Results  Component Value Date   CHOL 204 (H) 10/30/2013   TRIG 240 (H) 10/30/2013   HDL 38 (L) 10/30/2013   VLDL 48 (H) 10/30/2013   LDLCALC 118 (H) 10/30/2013   Lab Results  Component Value Date   TSH 2.634 05/18/2017   TSH 2.084 11/10/2016    Therapeutic Level Labs: No results found for: LITHIUM Lab Results  Component Value Date   VALPROATE 76.2 06/28/2017   VALPROATE 81 11/01/2013   No components found for:  CBMZ  Current Medications: Current Outpatient Medications  Medication Sig Dispense Refill  . acetaminophen (TYLENOL) 500 MG tablet Take 500 mg by mouth every 8 (eight) hours as needed for mild pain or moderate pain.    Marland Kitchen albuterol (PROAIR HFA) 108 (90 Base) MCG/ACT inhaler Inhale 2 puffs into the lungs every 6 (six) hours as needed for wheezing or shortness of breath.    Marland Kitchen atorvastatin (LIPITOR) 10 MG tablet Take 10 mg by mouth daily.    . benztropine (COGENTIN) 1 MG tablet Take 1 tablet (1 mg total) by mouth 2 (two) times daily. 180 tablet 0  . bisacodyl (DULCOLAX) 10 MG suppository Place 1 suppository (10 mg total) rectally as needed for moderate constipation. 12 suppository 0  . Camphor-Eucalyptus-Menthol (VICKS VAPORUB) 4.7-1.2-2.6 % OINT Apply 1 application topically daily. Apply once to toenails daily    . diclofenac sodium (VOLTAREN) 1 % GEL Apply 2 g topically  2 (two) times daily as needed.    . divalproex (DEPAKOTE ER) 250 MG 24 hr tablet Take 3 tablets (750 mg total) by mouth daily. 90 tablet 0  . docusate sodium (COLACE) 100 MG capsule Take 200 mg by mouth daily.    Marland Kitchen latanoprost (XALATAN) 0.005 % ophthalmic solution Place 1 drop into both eyes at bedtime.    Marland Kitchen linagliptin (TRADJENTA) 5 MG TABS tablet Take 5 mg by mouth daily.    Marland Kitchen loratadine (CLARITIN) 10 MG tablet Take 10 mg by mouth daily.    . metFORMIN (GLUCOPHAGE) 500 MG tablet Take 500 mg by mouth  2 (two) times daily with a meal.    . metoprolol tartrate (LOPRESSOR) 50 MG tablet Take 50 mg by mouth 2 (two) times daily.    Marland Kitchen OLANZapine (ZYPREXA) 10 MG tablet Take 1 tablet (10 mg total) by mouth at bedtime. 90 tablet 0  . pantoprazole (PROTONIX) 40 MG tablet Take 40 mg by mouth daily.    . polyethylene glycol powder (GLYCOLAX/MIRALAX) powder Take 17 g by mouth daily as needed for mild constipation or moderate constipation.    . risperiDONE (RISPERDAL) 2 MG tablet Take 1 tablet (2 mg total) by mouth 2 (two) times daily. 180 tablet 0  . senna-docusate (SENOKOT-S) 8.6-50 MG tablet Take 2 tablets by mouth at bedtime. 60 tablet 0  . traZODone (DESYREL) 50 MG tablet Take 1 tablet (50 mg total) by mouth at bedtime. 90 tablet 0  . benztropine (COGENTIN) 0.5 MG tablet Take 1 tablet (0.5 mg total) by mouth 2 (two) times daily. 180 tablet 0   No current facility-administered medications for this visit.      Musculoskeletal: Strength & Muscle Tone: within normal limits Gait & Station: normal- uses a walker Patient leans: N/A  Psychiatric Specialty Exam: Review of Systems  Psychiatric/Behavioral: Negative for depression, hallucinations, substance abuse and suicidal ideas. The patient is not nervous/anxious and does not have insomnia.   All other systems reviewed and are negative.   Blood pressure 129/85, pulse 79, height 5' (1.524 m), weight 182 lb (82.6 kg), SpO2 97 %.Body mass index is 35.54  kg/m.  General Appearance: Fairly Groomed  Eye Contact:  Good  Speech:  Clear and Coherent  Volume:  Normal  Mood:  "fine  Affect:  slightly restricted  Thought Process:  Coherent  Orientation:  Full (Time, Place, and Person), except date    Thought Content: Logical   Suicidal Thoughts:  No  Homicidal Thoughts:  No  Memory:  Immediate;   Fair  Judgement:  Fair  Insight:  Shallow  Psychomotor Activity:  Normal  Concentration:  Concentration: Good and Attention Span: Good  Recall:  Good  Fund of Knowledge: Good  Language: Good  Akathisia:  No  Handed:  Right  AIMS (if indicated): not done  Assets:  Communication Skills Desire for Improvement  ADL's:  Intact  Cognition: WNL  Sleep:  Good   Screenings: Mini cog- "cat" 1/3, clock- 1/3 (draw circle, put numbers of 12,5,10,13..), orientation 4/5 (except date)  Assessment and Plan:  JAELLA WEINERT is a 77 y.o. year old female with a history of schizophrenia, hypertension, type II diabetes , who presents for follow up appointment for Schizophrenia, unspecified type [facility])  # Schizophrenia Although the patient does not elaborate the story, there has been no significant behavioral issues since the last appointment per caregiver. Will continue Depakote to target mood dysregulation. Will continue olanzapine, risperidone for schizophrenia. Although it is preferable to consolidate to one antipsychotics, will continue this combination at this time to avoid any relapse. Discussed risk of metabolic side effect and EPS. No signs of tardive dyskinesia. Will taper down cogentin to avoid polypharmacy.   # Cognitive deficits She does have cognitive deficits as in Mini-cog. Will continue to monitor.   Plan I have reviewed and updated plans as below 1. Continue  valproic acid 750 mg at night  2. Continue risperidone 2 mg twice a day 3  Continue  olanzapine 10 mg at night  4. Decrease cogentin 0.5  mg twice a day 5. Return to clinic in three  months for 30 mins  The patient demonstrates the following risk factors for suicide: Chronic risk factors for suicide include: psychiatric disorder of schizophrenia. Acute risk factors for suicide include: unemployment. Protective factors for this patient include: positive social support. Considering these factors, the overall suicide risk at this point appears to be low. Patient is appropriate for outpatient follow up.  The duration of this appointment visit was 30 minutes of face-to-face time with the patient.  Greater than 50% of this time was spent in counseling, explanation of  diagnosis, planning of further management, and coordination of care.  [name], MD 08/15/2017, 11:08 AM

## 2017-08-15 ENCOUNTER — Ambulatory Visit (INDEPENDENT_AMBULATORY_CARE_PROVIDER_SITE_OTHER): Payer: Medicare Other | Admitting: Psychiatry

## 2017-08-15 ENCOUNTER — Encounter (HOSPITAL_COMMUNITY): Payer: Self-pay | Admitting: Psychiatry

## 2017-08-15 ENCOUNTER — Ambulatory Visit (HOSPITAL_COMMUNITY): Payer: Self-pay | Admitting: Psychiatry

## 2017-08-15 VITALS — BP 129/85 | HR 79 | Ht 60.0 in | Wt 182.0 lb

## 2017-08-15 DIAGNOSIS — R419 Unspecified symptoms and signs involving cognitive functions and awareness: Secondary | ICD-10-CM

## 2017-08-15 DIAGNOSIS — Z560 Unemployment, unspecified: Secondary | ICD-10-CM

## 2017-08-15 DIAGNOSIS — F209 Schizophrenia, unspecified: Secondary | ICD-10-CM

## 2017-08-15 MED ORDER — BENZTROPINE MESYLATE 0.5 MG PO TABS: 0.5000 mg | ORAL_TABLET | Freq: Two times a day (BID) | ORAL | 0 refills | Status: DC

## 2017-08-15 MED ORDER — TRAZODONE HCL 50 MG PO TABS: 50.0000 mg | ORAL_TABLET | Freq: Every day | ORAL | 0 refills | Status: DC

## 2017-08-15 MED ORDER — DIVALPROEX SODIUM ER 250 MG PO TB24: 750.0000 mg | ORAL_TABLET | Freq: Every day | ORAL | 0 refills | Status: DC

## 2017-08-15 MED ORDER — RISPERIDONE 2 MG PO TABS: 2.0000 mg | ORAL_TABLET | Freq: Two times a day (BID) | ORAL | 0 refills | Status: DC

## 2017-08-15 MED ORDER — OLANZAPINE 10 MG PO TABS: 10.0000 mg | ORAL_TABLET | Freq: Every day | ORAL | 0 refills | Status: DC

## 2017-08-15 NOTE — Patient Instructions (Addendum)
1. Continue  valproic acid 750 mg at night  2. Continue risperidone 2 mg twice a day 3  Continue  olanzapine 10 mg at night  4. Decrease cogentin 0.5  mg twice a day 5. Continue Trazodone 50 mg at night  5. Return to clinic in three months for 30 mins

## 2017-09-06 ENCOUNTER — Telehealth (HOSPITAL_COMMUNITY): Payer: Self-pay | Admitting: *Deleted

## 2017-09-06 ENCOUNTER — Other Ambulatory Visit (HOSPITAL_COMMUNITY): Payer: Self-pay | Admitting: Psychiatry

## 2017-09-06 MED ORDER — DIVALPROEX SODIUM ER 250 MG PO TB24: 750.0000 mg | ORAL_TABLET | Freq: Every day | ORAL | 0 refills | Status: DC

## 2017-09-06 NOTE — Telephone Encounter (Signed)
Dr Lanetta InchHisada Sharon # [phone] patient's care giver called requesting refill on Depakote 250 mg will run out 09/19/17  Next appointment is 11/15/17

## 2017-09-06 NOTE — Telephone Encounter (Signed)
ordered

## 2017-09-10 DIAGNOSIS — I10 Essential (primary) hypertension: Secondary | ICD-10-CM | POA: Diagnosis not present

## 2017-09-10 DIAGNOSIS — E1165 Type 2 diabetes mellitus with hyperglycemia: Secondary | ICD-10-CM | POA: Diagnosis not present

## 2017-09-12 DIAGNOSIS — I739 Peripheral vascular disease, unspecified: Secondary | ICD-10-CM | POA: Diagnosis not present

## 2017-09-12 DIAGNOSIS — E1142 Type 2 diabetes mellitus with diabetic polyneuropathy: Secondary | ICD-10-CM | POA: Diagnosis not present

## 2017-09-12 DIAGNOSIS — B351 Tinea unguium: Secondary | ICD-10-CM | POA: Diagnosis not present

## 2017-09-12 DIAGNOSIS — B353 Tinea pedis: Secondary | ICD-10-CM | POA: Diagnosis not present

## 2017-10-26 ENCOUNTER — Other Ambulatory Visit: Payer: Self-pay

## 2017-10-26 DIAGNOSIS — E1165 Type 2 diabetes mellitus with hyperglycemia: Secondary | ICD-10-CM | POA: Diagnosis not present

## 2017-10-26 DIAGNOSIS — I10 Essential (primary) hypertension: Secondary | ICD-10-CM | POA: Diagnosis not present

## 2017-11-01 NOTE — Progress Notes (Signed)
BH MD/PA/NP OP Progress Note  11/15/2017 9:34 AM Maria Pace  MRN:  [id number]  Chief Complaint:  Chief Complaint    Follow-up; Schizophrenia     HPI:  Patient presents for follow-up appointment for schizophrenia.  She states that she had a [birth year] on [birth year].  She did not go anywhere, although she agreed when the staff reminded her that they went outside on another day. She received a card from her younger sister. She does crochet. She enjoys watching news. She ruminates on cogentin when the plan of tapering off this medication is discussed. She denies insomnia.  She has good appetite.  She denies SI, HI, AH, VH.  She denies irritability.  She denies paranoia.  She feels safe at home.   The staff presents to the interview. Maria Pace tends to be upset when things are not done immediately as she wishes. She would go back to her room and would talk to herself. There has been no behavioral issues or safety concern. She may be mildly anxious at times. She takes medication as scheduled.   Wt Readings from Last 3 Encounters:  11/15/17 183 lb (83 kg)  08/15/17 182 lb (82.6 kg)  06/22/17 186 lb (84.4 kg)    Visit Diagnosis:    ICD-10-CM   1. Schizophrenia, unspecified type (HCC) F20.9     Past Psychiatric History: Please see initial evaluation for full details. I have reviewed the history. No updates at this time.     Past Medical History:  Past Medical History:  Diagnosis Date  . Bipolar 1 disorder (HCC)   . Essential hypertension   . GERD (gastroesophageal reflux disease)   . Hyperlipidemia   . Irregular heart beat   . Schizophrenia (HCC)   . Type 2 diabetes mellitus (HCC)     Past Surgical History:  Procedure Laterality Date  . YAG LASER APPLICATION Left 01/06/2015   Procedure: YAG LASER APPLICATION;  Surgeon: Jethro Bolus, MD;  Location: [facility];  Service: Ophthalmology;  Laterality: Left;    Family Psychiatric History: Please see initial evaluation for full details. I  have reviewed the history. No updates at this time.     Family History: No family history on file.  Social History:  Social History   Socioeconomic History  . Marital status: Single    Spouse name: Not on file  . Number of children: Not on file  . Years of education: Not on file  . Highest education level: Not on file  Occupational History  . Not on file  Social Needs  . Financial resource strain: Not on file  . Food insecurity:    Worry: Not on file    Inability: Not on file  . Transportation needs:    Medical: Not on file    Non-medical: Not on file  Tobacco Use  . Smoking status: Never Smoker  . Smokeless tobacco: Never Used  Substance and Sexual Activity  . Alcohol use: No  . Drug use: No  . Sexual activity: Never  Lifestyle  . Physical activity:    Days per week: Not on file    Minutes per session: Not on file  . Stress: Not on file  Relationships  . Social connections:    Talks on phone: Not on file    Gets together: Not on file    Attends religious service: Not on file    Active member of club or organization: Not on file    Attends meetings of clubs or  organizations: Not on file    Relationship status: Not on file  Other Topics Concern  . Not on file  Social History Narrative  . Not on file    Allergies:  Allergies  Allergen Reactions  . Haloperidol Lactate Other (See Comments)    Couldn't urinate anymore    Metabolic Disorder Labs: Lab Results  Component Value Date   HGBA1C 11.0 (H) 10/28/2013   No results found for: PROLACTIN Lab Results  Component Value Date   CHOL 204 (H) 10/30/2013   TRIG 240 (H) 10/30/2013   HDL 38 (L) 10/30/2013   VLDL 48 (H) 10/30/2013   LDLCALC 118 (H) 10/30/2013   Lab Results  Component Value Date   TSH 2.634 05/18/2017   TSH 2.084 11/10/2016    Therapeutic Level Labs: No results found for: LITHIUM Lab Results  Component Value Date   VALPROATE 76.2 06/28/2017   VALPROATE 81 11/01/2013   No  components found for:  CBMZ  Current Medications: Current Outpatient Medications  Medication Sig Dispense Refill  . acetaminophen (TYLENOL) 500 MG tablet Take 500 mg by mouth every 8 (eight) hours as needed for mild pain or moderate pain.    Marland Kitchen albuterol (PROAIR HFA) 108 (90 Base) MCG/ACT inhaler Inhale 2 puffs into the lungs every 6 (six) hours as needed for wheezing or shortness of breath.    Marland Kitchen atorvastatin (LIPITOR) 10 MG tablet Take 10 mg by mouth daily.    . benztropine (COGENTIN) 0.5 MG tablet Take 1 tablet (0.5 mg total) by mouth 2 (two) times daily. 180 tablet 0  . benztropine (COGENTIN) 1 MG tablet Take 1 tablet (1 mg total) by mouth 2 (two) times daily. 180 tablet 0  . bisacodyl (DULCOLAX) 10 MG suppository Place 1 suppository (10 mg total) rectally as needed for moderate constipation. 12 suppository 0  . Camphor-Eucalyptus-Menthol (VICKS VAPORUB) 4.7-1.2-2.6 % OINT Apply 1 application topically daily. Apply once to toenails daily    . diclofenac sodium (VOLTAREN) 1 % GEL Apply 2 g topically 2 (two) times daily as needed.    . divalproex (DEPAKOTE ER) 250 MG 24 hr tablet Take 3 tablets (750 mg total) by mouth daily. 270 tablet 0  . docusate sodium (COLACE) 100 MG capsule Take 200 mg by mouth daily.    Marland Kitchen latanoprost (XALATAN) 0.005 % ophthalmic solution Place 1 drop into both eyes at bedtime.    Marland Kitchen linagliptin (TRADJENTA) 5 MG TABS tablet Take 5 mg by mouth daily.    Marland Kitchen loratadine (CLARITIN) 10 MG tablet Take 10 mg by mouth daily.    . metFORMIN (GLUCOPHAGE) 500 MG tablet Take 500 mg by mouth 2 (two) times daily with a meal.    . metoprolol tartrate (LOPRESSOR) 50 MG tablet Take 50 mg by mouth 2 (two) times daily.    Marland Kitchen OLANZapine (ZYPREXA) 10 MG tablet Take 1 tablet (10 mg total) by mouth at bedtime. 90 tablet 0  . pantoprazole (PROTONIX) 40 MG tablet Take 40 mg by mouth daily.    . polyethylene glycol powder (GLYCOLAX/MIRALAX) powder Take 17 g by mouth daily as needed for mild  constipation or moderate constipation.    . risperiDONE (RISPERDAL) 2 MG tablet Take 1 tablet (2 mg total) by mouth 2 (two) times daily. 180 tablet 0  . senna-docusate (SENOKOT-S) 8.6-50 MG tablet Take 2 tablets by mouth at bedtime. 60 tablet 0  . traZODone (DESYREL) 50 MG tablet Take 1 tablet (50 mg total) by mouth at bedtime. 90 tablet 0  No current facility-administered medications for this visit.      Musculoskeletal: Strength & Muscle Tone: within normal limits Gait & Station: unsteady- uses a walker Patient leans: N/A  Psychiatric Specialty Exam: Review of Systems  Psychiatric/Behavioral: Negative for depression, hallucinations, memory loss, substance abuse and suicidal ideas. The patient is nervous/anxious. The patient does not have insomnia.   All other systems reviewed and are negative.   Blood pressure (!) 155/103, pulse (!) 107, height 5' (1.524 m), weight 183 lb (83 kg), SpO2 98 %.Body mass index is 35.74 kg/m.  General Appearance: Fairly Groomed  Eye Contact:  Good  Speech:  Garbled  Volume:  Normal  Mood:  "fine"  Affect:  Restricted  Thought Process:  Coherent  Orientation:  Full (Time, Place, and Person) except date ("20th")  Thought Content: Rumination   Suicidal Thoughts:  No  Homicidal Thoughts:  No  Memory:  Immediate;   Good  Judgement:  Fair  Insight:  Present  Psychomotor Activity:  Normal  Concentration:  Concentration: Good and Attention Span: Good  Recall:  Good  Fund of Knowledge: Good  Language: Good  Akathisia:  No  Handed:  Right  AIMS (if indicated): not done  Assets:  Communication Skills Desire for Improvement  ADL's:  Intact  Cognition: WNL  Sleep:  Good   Screenings:  Mini cog- "cat" 1/3, clock- 1/3 (draw circle, put numbers of 12,5,10,13..), orientation 4/5 (except date)   Assessment and Plan:  Maria Pace is a 77 y.o. year old female with a history of schizophrenia, hypertension, type II diabetes, who presents for follow  up appointment for Schizophrenia, unspecified type [facility](HCC)  # Schizophrenia Patient demonstrates linear thought process during the interview.  The staff denies significant behavioral issues except occasional rumination on things.  Will continue Depakote to target mood dysregulation.  Will continue olanzapine, risperidone for schizophrenia.  Discussed risk of metabolic side effect.  Although it is preferable to consolidate to one antipsychotics, will continue this combination at this time to avoid relapse.  Will taper off Cogentin to avoid polypharmacy.  Will continue trazodone for sleep.   # Cognitive deficits She does have cognitive deficits as the mini cog.  Will continue to monitor.   Plan I have reviewed and updated plans as below 1. Continue valproic acid 750 mg at night (VPA 76.2 in 06/2017)-obtain level at the next visit 2. Continuerisperidone 2 mgtwice a day 3  Continue  olanzapine 10 mgat night  4. Decrease cogentin 0.5 mg daily for one week, then discontinue  5. Continue Trazodone 50 mg at night 5. Return to clinic in one month for 15 mins  The patient demonstrates the following risk factors for suicide: Chronic risk factors for suicide include:psychiatric disorder ofschizophrenia. Acute risk factorsfor suicide include: unemployment. Protective factorsfor this patient include: positive social support. Considering these factors, the overall suicide risk at this point appears to below. Patientisappropriate for outpatient follow up.  The duration of this appointment visit was 30 minutes of face-to-face time with the patient.  Greater than 50% of this time was spent in counseling, explanation of  diagnosis, planning of further management, and coordination of care.  Neysa Hottereina Teonna Coonan, MD 11/15/2017, 9:34 AM

## 2017-11-12 IMAGING — CT CT ABD-PELV W/O CM
2 of 4 series · 16 of 46 positions shown, 18 images · non-contrast
Comparison: Current abdominal radiographs suggesting a bowel
obstruction.

CLINICAL DATA: Nausea vomiting for 3 days. Increasing weakness with
shortness of breath today.

EXAM:
CT ABDOMEN AND PELVIS WITHOUT CONTRAST
TECHNIQUE: Multidetector CT imaging of the abdomen and pelvis was performed
following the standard protocol without IV contrast.

[Series 2: routine abd pel without · axial · non-contrast · 0.75mm/px · z∈[-404,+26]mm · 13 of 94 slices shown, 15 images]
[im 4/94  soft-tissue]
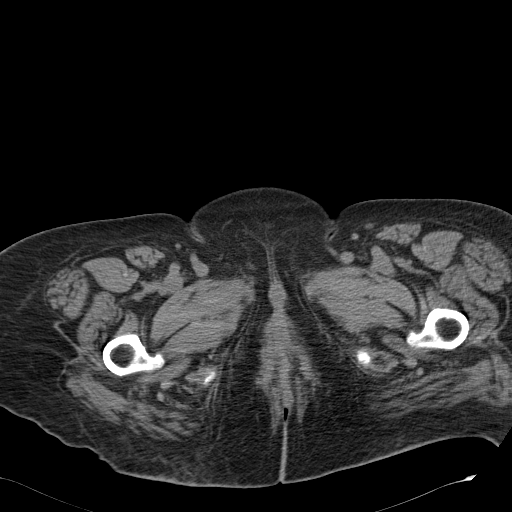
[im 4/94  bone]
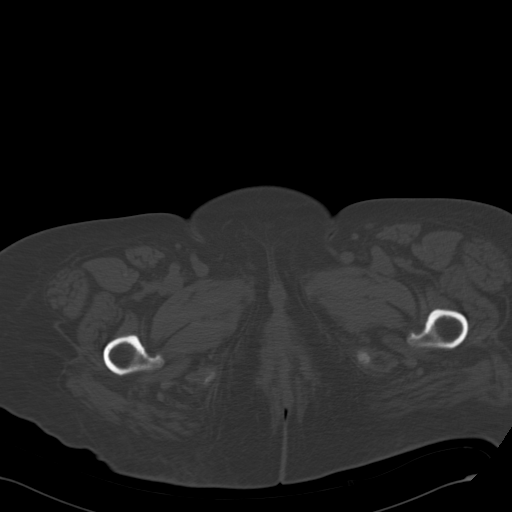
[im 12/94  soft-tissue]
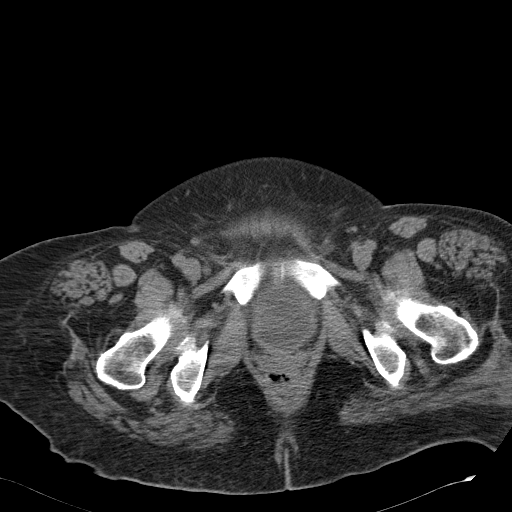
[im 20/94  soft-tissue]
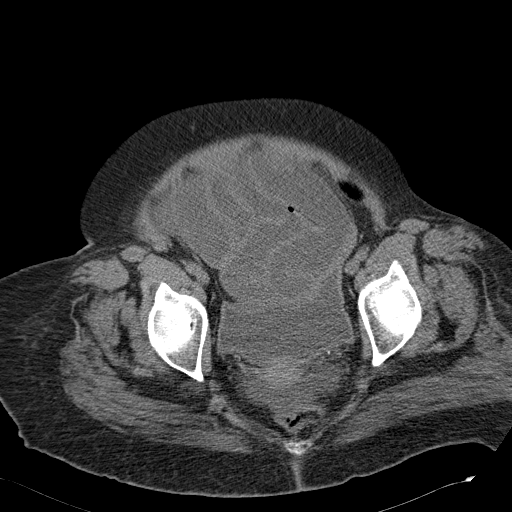
[im 28/94  soft-tissue]
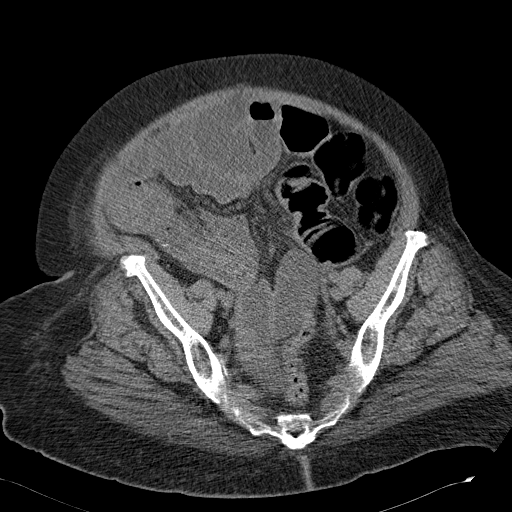
[im 32/94  soft-tissue]
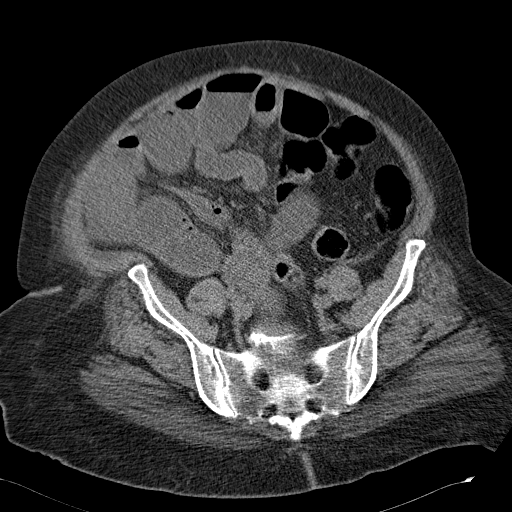
[im 39/94  soft-tissue]
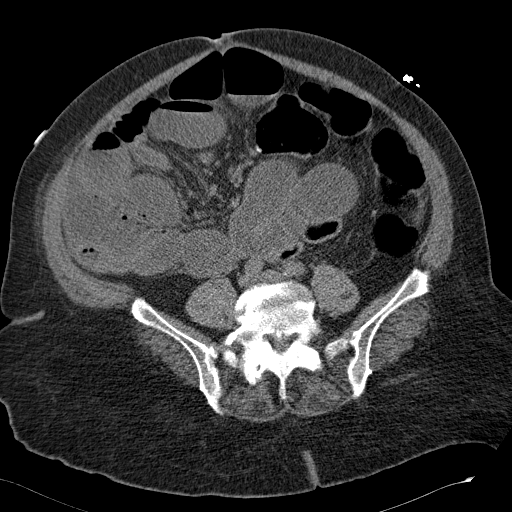
[im 47/94  soft-tissue]
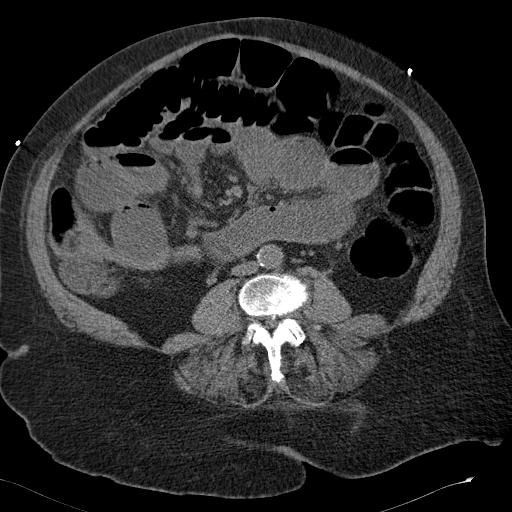
[im 55/94  soft-tissue]
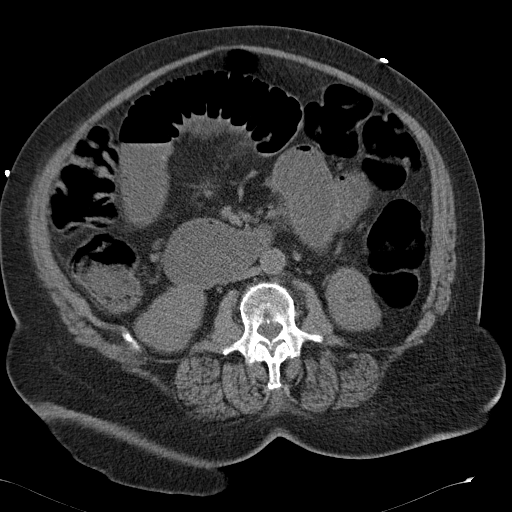
[im 63/94  soft-tissue]
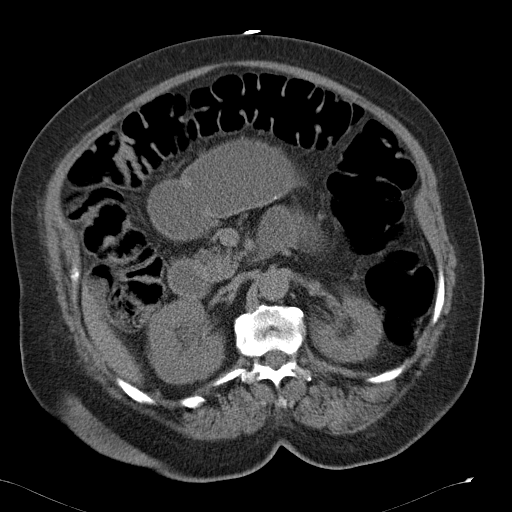
[im 63/94  bone]
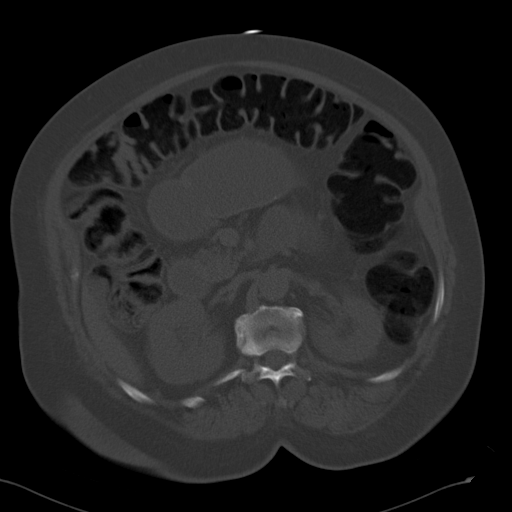
[im 66/94  soft-tissue]
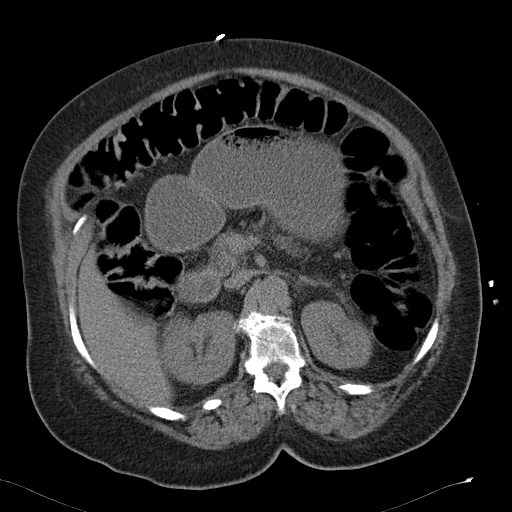
[im 74/94  soft-tissue]
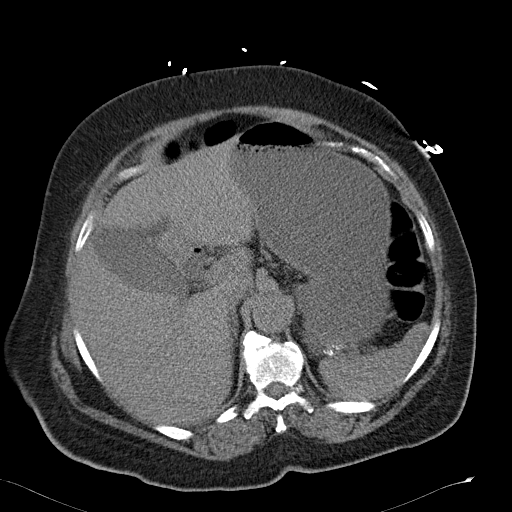
[im 82/94  soft-tissue]
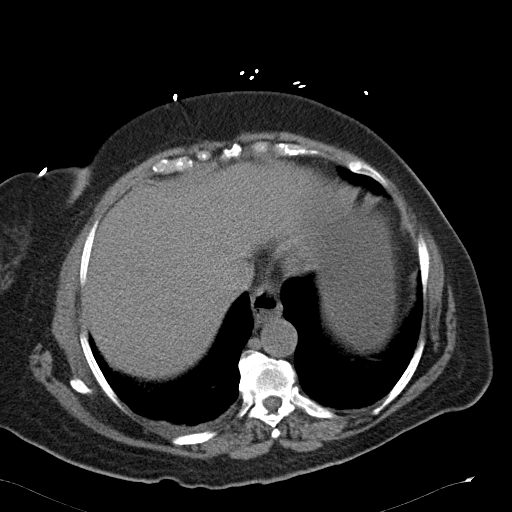
[im 90/94  soft-tissue]
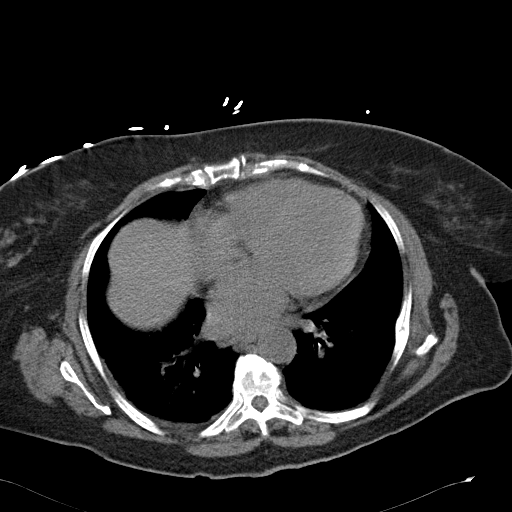

[Series 4: coronal · coronal · 0.79mm/px · 3 of 174 slices shown]
[im 58/174  soft-tissue]
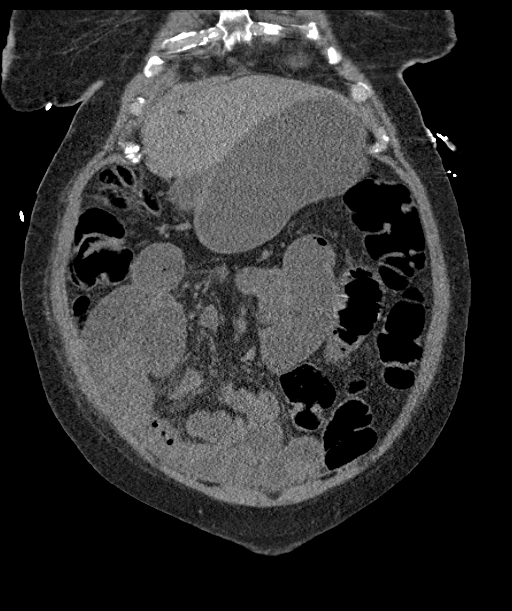
[im 77/174  soft-tissue]
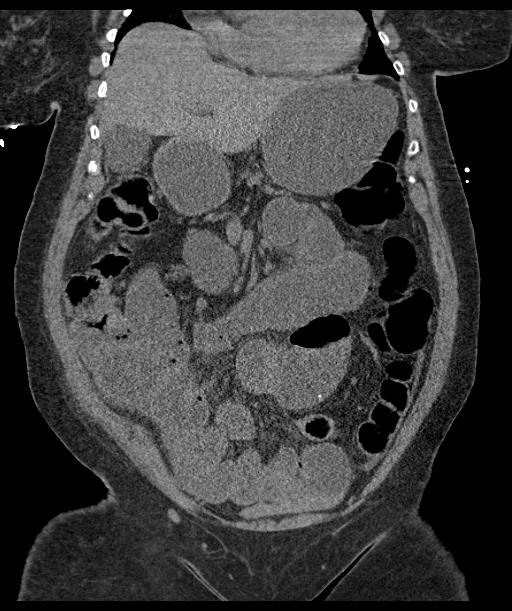
[im 97/174  soft-tissue]
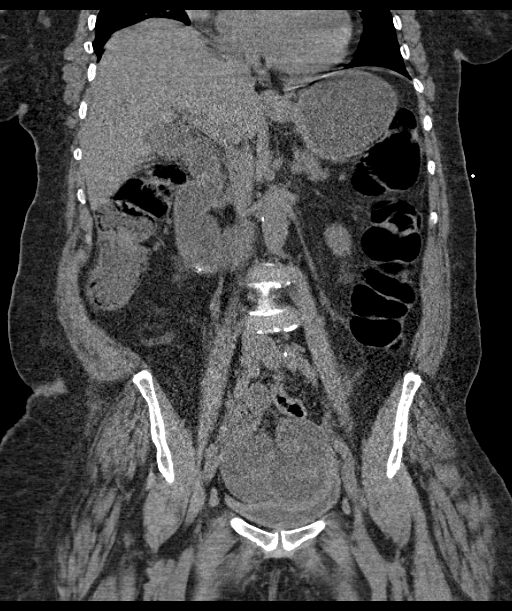

[16 of 46 positions shown; findings below may reference images not displayed]

FINDINGS: Lung bases: Heart is mildly enlarged. Mild lung base subsegmental
atelectasis. Minimal right pleural effusion.

Liver, spleen, gallbladder, pancreas, adrenal glands: Unremarkable.

Kidneys, ureters, bladder:  Unremarkable.

Uterus and adnexa:  Unremarkable.

Lymph nodes:  No adenopathy.

Ascites: Small amount pelvic free fluid.

Vascular: Mild atherosclerotic calcifications along a normal caliber
infrarenal abdominal aorta.

Gastrointestinal: Stomach is distended as is the proximal to mid
small bowel with the distal ileum decompressed. There is a
transition point in the right mid abdomen. Colon is normal in
caliber. There is no bowel wall thickening or adjacent inflammatory
change. A normal appendix is visualized.

No free air.

Musculoskeletal: There are degenerative changes throughout the
visualized spine. No osteoblastic or osteolytic lesions.
IMPRESSION: 1. Moderate to high-grade small bowel obstruction with the
transition point noted in the right mid abdomen, likely due to
adhesions.
2. No other acute abnormalities.

## 2017-11-12 IMAGING — DX DG ABDOMEN ACUTE W/ 1V CHEST
4 series · 4 of 4 positions shown · non-contrast
Comparison: 10/02/2014 chest radiograph

CLINICAL DATA: Nausea, vomiting, and diarrhea since [REDACTED].
Abdominal pain and distention.

EXAM:
DG ABDOMEN ACUTE W/ 1V CHEST

[chest pa]
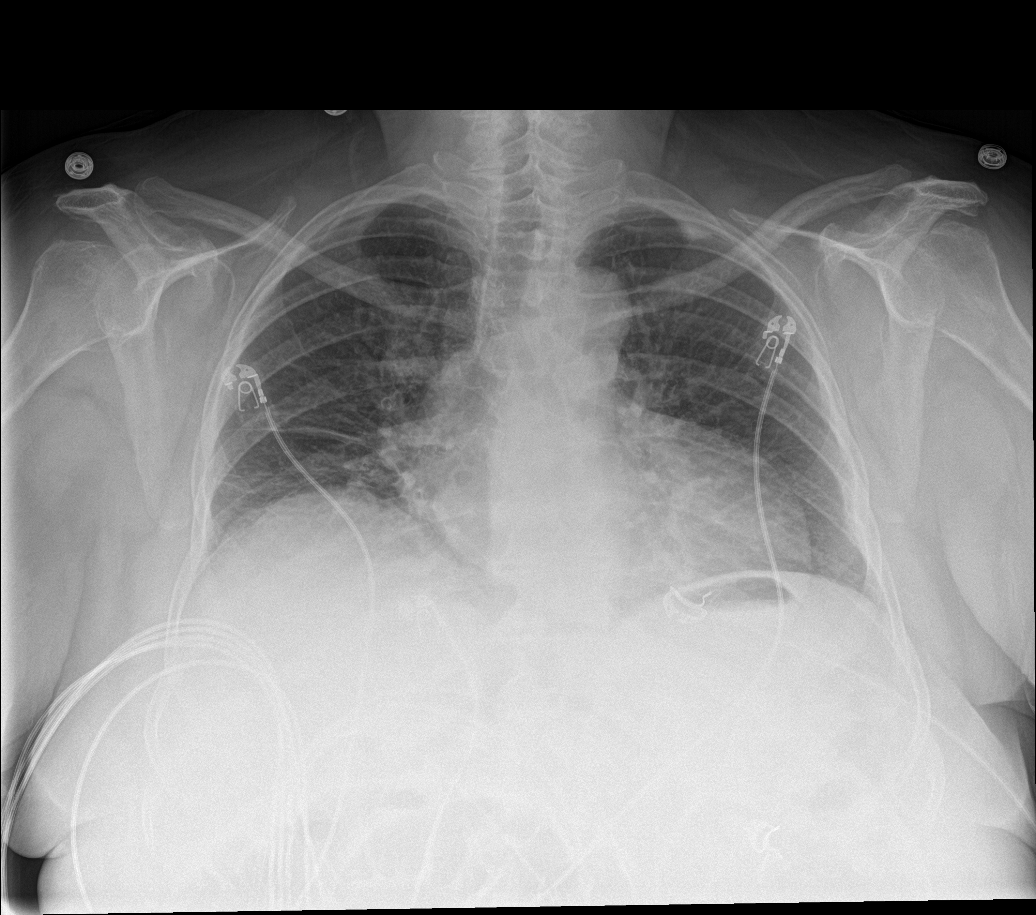

[abdomen erect]
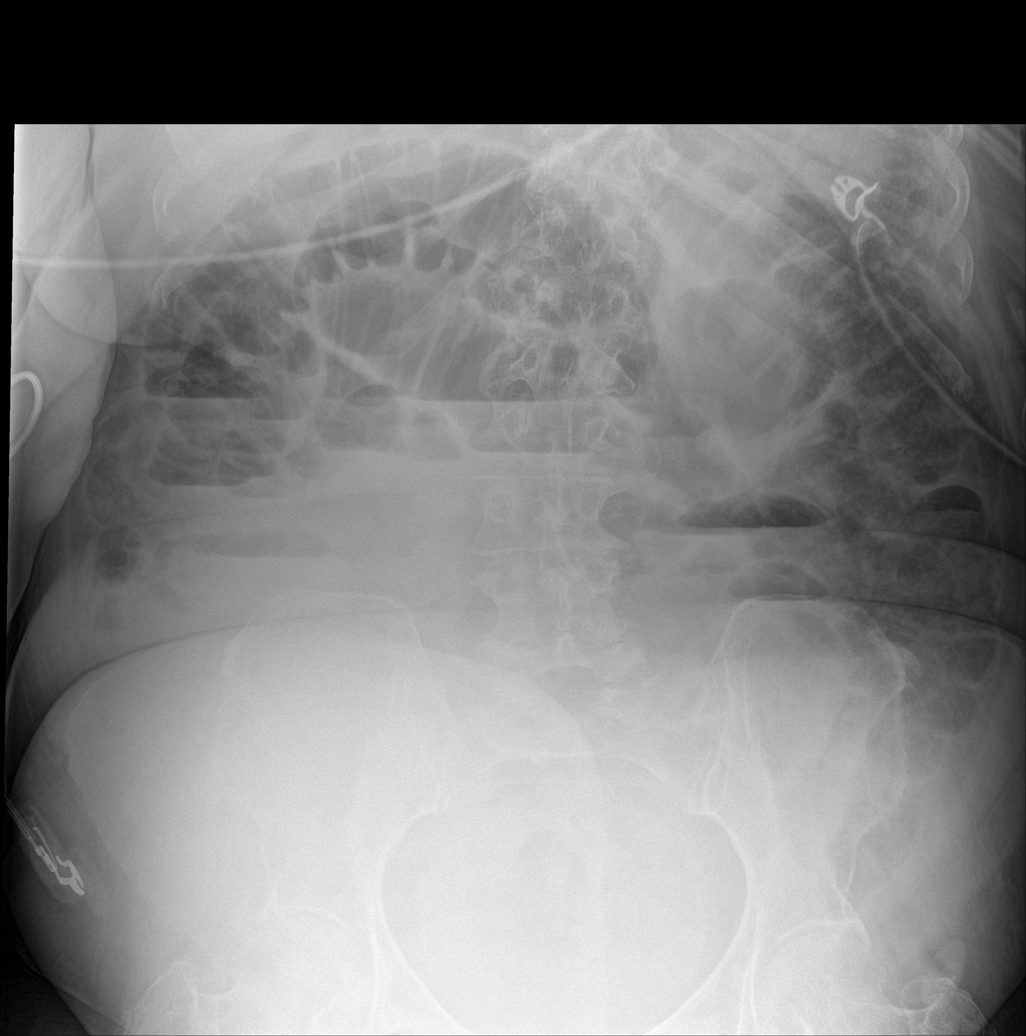

[abdomen supine (1 of 2)]
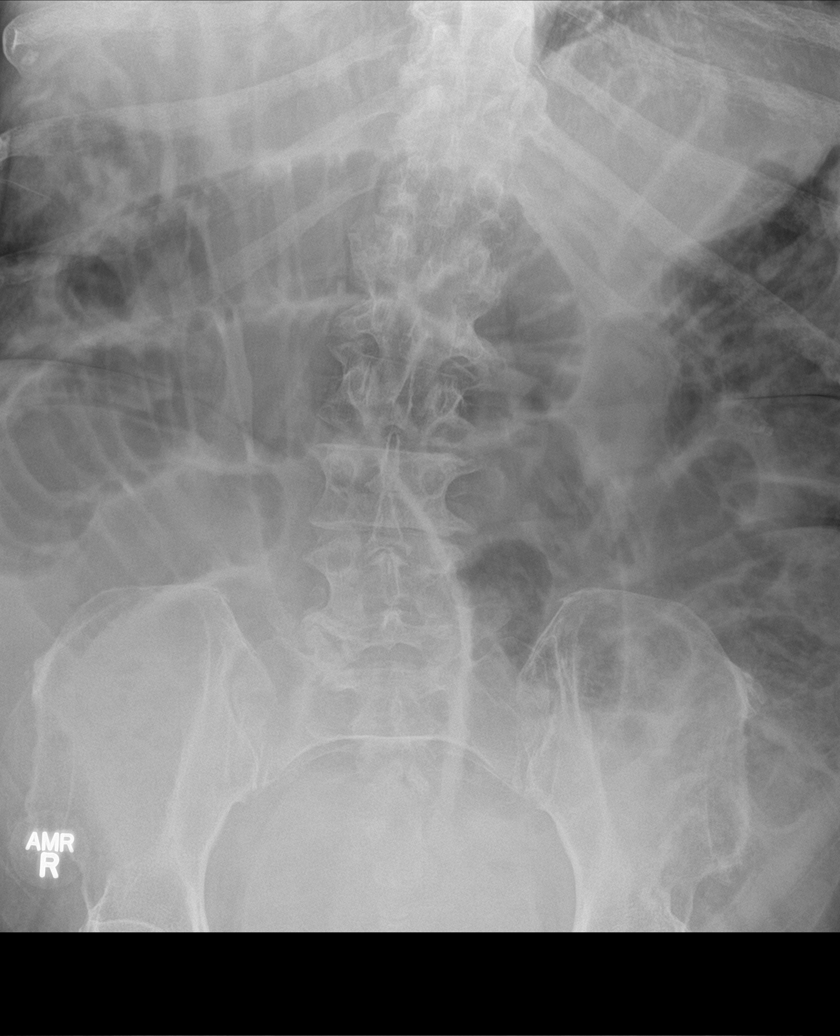

[abdomen supine (2 of 2)]
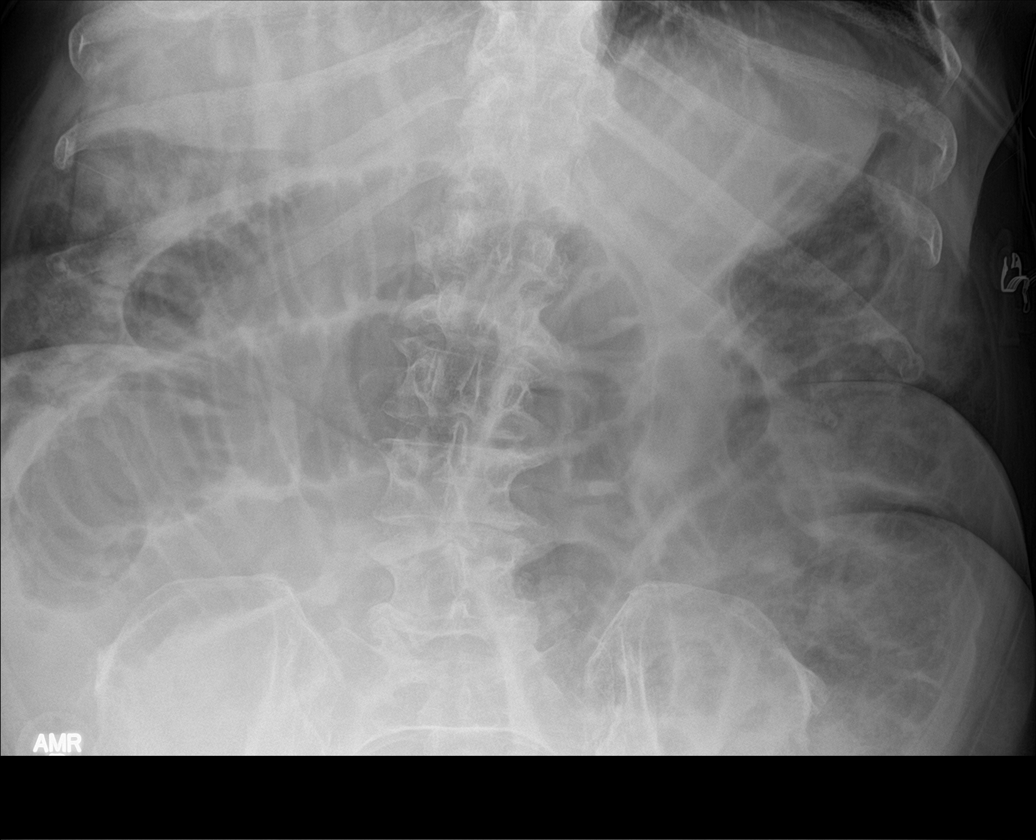

[4 of 4 positions shown; findings below may reference images not displayed]

FINDINGS: Mild enlargement of the cardiopericardial silhouette. Stable small
notch like left apical density, no change from 07/20/2013, likely
from scarring. Subsegmental atelectasis at the right lung base. Low
lung volumes are present, causing crowding of the pulmonary
vasculature.

Dilated loops of small bowel up to 6 cm in diameter, with air- fluid
levels at differing vertical heights in some loops, suggesting small
bowel obstruction.

Paucity of gas in the right lower quadrant, questionable mass effect
or mass lesion.

There is some formed stool in the descending colon.

The Gerth Sandeberg lumbar spondylosis.

No compelling findings of free intraperitoneal gas.
IMPRESSION: 1. Dilated loops of small bowel with air-fluid levels suggesting
small bowel obstruction.
2. Paucity of right lower quadrant bowel, query mass lesion in this
region.
3. CT of the abdomen and pelvis is suggested for further workup.
4. Subsegmental atelectasis at the right lung base with low lung
volumes.
5. Mild enlargement of the cardiopericardial silhouette.

## 2017-11-15 ENCOUNTER — Encounter (HOSPITAL_COMMUNITY): Payer: Self-pay | Admitting: Psychiatry

## 2017-11-15 ENCOUNTER — Ambulatory Visit (INDEPENDENT_AMBULATORY_CARE_PROVIDER_SITE_OTHER): Payer: Medicare Other | Admitting: Psychiatry

## 2017-11-15 VITALS — BP 155/103 | HR 107 | Ht 60.0 in | Wt 183.0 lb

## 2017-11-15 DIAGNOSIS — F209 Schizophrenia, unspecified: Secondary | ICD-10-CM | POA: Insufficient documentation

## 2017-11-15 MED ORDER — DIVALPROEX SODIUM ER 250 MG PO TB24: 750.0000 mg | ORAL_TABLET | Freq: Every day | ORAL | 0 refills | Status: DC

## 2017-11-15 MED ORDER — RISPERIDONE 2 MG PO TABS: 2.0000 mg | ORAL_TABLET | Freq: Two times a day (BID) | ORAL | 0 refills | Status: DC

## 2017-11-15 MED ORDER — TRAZODONE HCL 50 MG PO TABS: 50.0000 mg | ORAL_TABLET | Freq: Every day | ORAL | 0 refills | Status: DC

## 2017-11-15 MED ORDER — OLANZAPINE 10 MG PO TABS: 10.0000 mg | ORAL_TABLET | Freq: Every day | ORAL | 0 refills | Status: DC

## 2017-11-15 NOTE — Patient Instructions (Addendum)
1. Continue valproic acid 750 mg at night (VPA 76.2 in 06/2017) 2. Continuerisperidone 2 mgtwice a day 3  Continue  olanzapine 10 mgat night  4. Decrease cogentin 0.5 mg daily for one week, then discontinue  5. Return to clinic in one month for 15 mins

## 2017-11-21 DIAGNOSIS — B351 Tinea unguium: Secondary | ICD-10-CM | POA: Diagnosis not present

## 2017-11-21 DIAGNOSIS — I739 Peripheral vascular disease, unspecified: Secondary | ICD-10-CM | POA: Diagnosis not present

## 2017-11-21 DIAGNOSIS — E1142 Type 2 diabetes mellitus with diabetic polyneuropathy: Secondary | ICD-10-CM | POA: Diagnosis not present

## 2017-11-26 DIAGNOSIS — I10 Essential (primary) hypertension: Secondary | ICD-10-CM | POA: Diagnosis not present

## 2017-11-26 DIAGNOSIS — E1165 Type 2 diabetes mellitus with hyperglycemia: Secondary | ICD-10-CM | POA: Diagnosis not present

## 2017-12-05 ENCOUNTER — Telehealth (HOSPITAL_COMMUNITY): Payer: Self-pay | Admitting: *Deleted

## 2017-12-05 NOTE — Telephone Encounter (Signed)
Dr Vanetta Shawl  [facility] called for refill on :  benztropine (COGENTIN) 0.5 MG tablet   180 tablet     Sig - Route: Take 1 tablet (0.5 mg total) by mouth 2 (two) times daily.

## 2017-12-05 NOTE — Telephone Encounter (Signed)
It was advised that decrease cogentin 0.5 mg daily for one week, then discontinue. So I will not fill this medication.

## 2017-12-19 DIAGNOSIS — H401131 Primary open-angle glaucoma, bilateral, mild stage: Secondary | ICD-10-CM | POA: Diagnosis not present

## 2017-12-19 DIAGNOSIS — Z7984 Long term (current) use of oral hypoglycemic drugs: Secondary | ICD-10-CM | POA: Diagnosis not present

## 2017-12-19 DIAGNOSIS — E119 Type 2 diabetes mellitus without complications: Secondary | ICD-10-CM | POA: Diagnosis not present

## 2018-01-09 DIAGNOSIS — Z23 Encounter for immunization: Secondary | ICD-10-CM | POA: Diagnosis not present

## 2018-01-09 DIAGNOSIS — I10 Essential (primary) hypertension: Secondary | ICD-10-CM | POA: Diagnosis not present

## 2018-01-09 DIAGNOSIS — R300 Dysuria: Secondary | ICD-10-CM | POA: Diagnosis not present

## 2018-01-09 DIAGNOSIS — F209 Schizophrenia, unspecified: Secondary | ICD-10-CM | POA: Diagnosis not present

## 2018-01-09 DIAGNOSIS — E119 Type 2 diabetes mellitus without complications: Secondary | ICD-10-CM | POA: Diagnosis not present

## 2018-01-09 DIAGNOSIS — E669 Obesity, unspecified: Secondary | ICD-10-CM | POA: Diagnosis not present

## 2018-01-09 DIAGNOSIS — E1165 Type 2 diabetes mellitus with hyperglycemia: Secondary | ICD-10-CM | POA: Diagnosis not present

## 2018-01-09 DIAGNOSIS — Z0000 Encounter for general adult medical examination without abnormal findings: Secondary | ICD-10-CM | POA: Diagnosis not present

## 2018-01-09 DIAGNOSIS — F315 Bipolar disorder, current episode depressed, severe, with psychotic features: Secondary | ICD-10-CM | POA: Diagnosis not present

## 2018-01-30 DIAGNOSIS — E1142 Type 2 diabetes mellitus with diabetic polyneuropathy: Secondary | ICD-10-CM | POA: Diagnosis not present

## 2018-01-30 DIAGNOSIS — B351 Tinea unguium: Secondary | ICD-10-CM | POA: Diagnosis not present

## 2018-01-30 DIAGNOSIS — I739 Peripheral vascular disease, unspecified: Secondary | ICD-10-CM | POA: Diagnosis not present

## 2018-02-07 NOTE — Progress Notes (Signed)
BH MD/PA/NP OP Progress Note  02/15/2018 9:27 AM Maria Pace  MRN:  [id number]  Chief Complaint:  Chief Complaint    Follow-up; Schizophrenia     HPI:  Patient presents for follow-up appointment for schizophrenia.  She states that she has been doing well.  She enjoys making hat and writing songs.  She feels safe at home. She denies insomnia. She has good appetite. She denies feeling depressed, anxious. She denies SI, HI, AH, VH. She denies paranoia, ideas of reference.  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  She tends to be forgetful, and needs to be repeated at times.  She appears to be anxious when she comes to the appointment.  She occasionally has hand tremors. She occasionally becomes loud, although there is no behavioral concern. No safety concern.  She is picky about what she eats.  She has fair sleep at night.   Wt Readings from Last 3 Encounters:  02/15/18 183 lb (83 kg)  11/15/17 183 lb (83 kg)  08/15/17 182 lb (82.6 kg)    Visit Diagnosis:    ICD-10-CM   1. Schizophrenia, unspecified type (HCC) F20.9 Valproic Acid level    Past Psychiatric History: Please see initial evaluation for full details. I have reviewed the history. No updates at this time.     Past Medical History:  Past Medical History:  Diagnosis Date  . Bipolar 1 disorder (HCC)   . Essential hypertension   . GERD (gastroesophageal reflux disease)   . Hyperlipidemia   . Irregular heart beat   . Schizophrenia (HCC)   . Type 2 diabetes mellitus (HCC)     Past Surgical History:  Procedure Laterality Date  . YAG LASER APPLICATION Left 01/06/2015   Procedure: YAG LASER APPLICATION;  Surgeon: Jethro BolusMark Shapiro, MD;  Location: [facility];  Service: Ophthalmology;  Laterality: Left;    Family Psychiatric History: Please see initial evaluation for full details. I have reviewed the history. No updates at this time.      Family History: No family history on file.  Social History:  Social History   Socioeconomic History  . Marital status: Single    Spouse name: Not on file  . Number of children: Not on file  . Years of education: Not on file  . Highest education level: Not on file  Occupational History  . Not on file  Social Needs  . Financial resource strain: Not on file  . Food insecurity:    Worry: Not on file    Inability: Not on file  . Transportation needs:    Medical: Not on file    Non-medical: Not on file  Tobacco Use  . Smoking status: Never Smoker  . Smokeless tobacco: Never Used  Substance and Sexual Activity  . Alcohol use: No  . Drug use: No  . Sexual activity: Never  Lifestyle  . Physical activity:    Days per week: Not on file    Minutes per session: Not on file  . Stress: Not on file  Relationships  . Social connections:    Talks on phone: Not on file    Gets together: Not on file    Attends religious service: Not on file    Active member of club or organization: Not on file    Attends meetings of clubs or organizations: Not on file    Relationship status: Not  on file  Other Topics Concern  . Not on file  Social History Narrative  . Not on file    Allergies:  Allergies  Allergen Reactions  . Haloperidol Lactate Other (See Comments)    Couldn't urinate anymore    Metabolic Disorder Labs: Lab Results  Component Value Date   HGBA1C 11.0 (H) 10/28/2013   No results found for: PROLACTIN Lab Results  Component Value Date   CHOL 204 (H) 10/30/2013   TRIG 240 (H) 10/30/2013   HDL 38 (L) 10/30/2013   VLDL 48 (H) 10/30/2013   LDLCALC 118 (H) 10/30/2013   Lab Results  Component Value Date   TSH 2.634 05/18/2017   TSH 2.084 11/10/2016    Therapeutic Level Labs: No results found for: LITHIUM Lab Results  Component Value Date   VALPROATE 76.2 06/28/2017   VALPROATE 81 11/01/2013   No components found for:  CBMZ  Current Medications: Current  Outpatient Medications  Medication Sig Dispense Refill  . acetaminophen (TYLENOL) 500 MG tablet Take 500 mg by mouth every 8 (eight) hours as needed for mild pain or moderate pain.    Marland Kitchen albuterol (PROAIR HFA) 108 (90 Base) MCG/ACT inhaler Inhale 2 puffs into the lungs every 6 (six) hours as needed for wheezing or shortness of breath.    Marland Kitchen atorvastatin (LIPITOR) 10 MG tablet Take 10 mg by mouth daily.    . bisacodyl (DULCOLAX) 10 MG suppository Place 1 suppository (10 mg total) rectally as needed for moderate constipation. 12 suppository 0  . Camphor-Eucalyptus-Menthol (VICKS VAPORUB) 4.7-1.2-2.6 % OINT Apply 1 application topically daily. Apply once to toenails daily    . diclofenac sodium (VOLTAREN) 1 % GEL Apply 2 g topically 2 (two) times daily as needed.    . divalproex (DEPAKOTE ER) 250 MG 24 hr tablet Take 3 tablets (750 mg total) by mouth daily. 270 tablet 0  . docusate sodium (COLACE) 100 MG capsule Take 200 mg by mouth daily.    . hydrochlorothiazide (MICROZIDE) 12.5 MG capsule Take 12.5 mg by mouth daily.    Marland Kitchen latanoprost (XALATAN) 0.005 % ophthalmic solution Place 1 drop into both eyes at bedtime.    Marland Kitchen linagliptin (TRADJENTA) 5 MG TABS tablet Take 5 mg by mouth daily.    Marland Kitchen lisinopril (PRINIVIL,ZESTRIL) 20 MG tablet Take 20 mg by mouth daily.    Marland Kitchen loratadine (CLARITIN) 10 MG tablet Take 10 mg by mouth daily.    . metFORMIN (GLUCOPHAGE) 500 MG tablet Take 500 mg by mouth 2 (two) times daily with a meal.    . metoprolol tartrate (LOPRESSOR) 50 MG tablet Take 50 mg by mouth 2 (two) times daily.    Marland Kitchen OLANZapine (ZYPREXA) 10 MG tablet Take 1 tablet (10 mg total) by mouth at bedtime. 90 tablet 0  . pantoprazole (PROTONIX) 40 MG tablet Take 40 mg by mouth daily.    . polyethylene glycol powder (GLYCOLAX/MIRALAX) powder Take 17 g by mouth daily as needed for mild constipation or moderate constipation.    . risperiDONE (RISPERDAL) 2 MG tablet Take 1 tablet (2 mg total) by mouth 2 (two) times  daily. 180 tablet 0  . senna-docusate (SENOKOT-S) 8.6-50 MG tablet Take 2 tablets by mouth at bedtime. 60 tablet 0  . traZODone (DESYREL) 50 MG tablet Take 1 tablet (50 mg total) by mouth at bedtime. 90 tablet 0   No current facility-administered medications for this visit.      Musculoskeletal: Strength & Muscle Tone: within normal limits Gait &  Station: normal- uses a walker Patient leans: N/A  Psychiatric Specialty Exam: Review of Systems  Psychiatric/Behavioral: Positive for memory loss. Negative for depression, hallucinations, substance abuse and suicidal ideas. The patient is not nervous/anxious and does not have insomnia.   All other systems reviewed and are negative.   Blood pressure 140/76, pulse (!) 107, height 5' (1.524 m), weight 183 lb (83 kg), SpO2 97 %.Body mass index is 35.74 kg/m.  General Appearance: Fairly Groomed  Eye Contact:  Fair  Speech:  Slurred  Volume:  Normal  Mood:  "fine"  Affect:  Constricted  Thought Process:  Coherent  Orientation:  Full (Time, Place, and Person)  Thought Content: Logical no paranoia  Suicidal Thoughts:  No  Homicidal Thoughts:  No  Memory:  Immediate;   Good  Judgement:  Fair  Insight:  Present  Psychomotor Activity:  Normal  Concentration:  Concentration: Good and Attention Span: Good  Recall:  Fair  Fund of Knowledge: Good  Language: Good  Akathisia:  No  Handed:  Right  AIMS (if indicated): no rigidity, no tremors.  Assets:  Communication Skills Desire for Improvement  ADL's:  Impaired uses a walker  Cognition: WNL  Sleep:  Fair   Screenings: Mini cog- "cat" 1/3, clock- 1/3 (draw circle, putnumbers of12,5,10,13..), orientation 4/5 (except date) on 10/2017  Assessment and Plan:  Maria Pace is a 77 y.o. year old female with a history of schizophrenia, hypertension, type II diabetes , who presents for follow up appointment for Schizophrenia, unspecified type (HCC) - Plan: Valproic Acid level  #  Schizophrenia Patient demonstrates linear thought process during the interview.  The staff denies significant behavioral issues except occasional irritability and resistance to redirection.  Will continue Depakote to target mood dysregulation.  Will continue risperidone, and olanzapine for schizophrenia.  Discussed risk of metabolic side effect.  Although it is preferable to consolidate to one antipsychotics, will continue this combination at this time to avoid relapse.  Will continue trazodone for sleep.   # Cognitive deficits She does have cognitive deficits as characterized by mini cog.  Will continue to monitor.   Plan I have reviewed and updated plans as below 1.Continuevalproic acid 750 mg at night (VPA 76.2 in 06/2017)-obtain level  2. Continuerisperidone 2 mgtwice a day 3 Continue olanzapine 10 mgat night 4. Continue Trazodone 50 mg at night 5. Return to clinic in three months for 15 mins  The patient demonstrates the following risk factors for suicide: Chronic risk factors for suicide include:psychiatric disorder ofschizophrenia. Acute risk factorsfor suicide include: unemployment. Protective factorsfor this patient include: positive social support. Considering these factors, the overall suicide risk at this point appears to below. Patientisappropriate for outpatient follow up.   [name], MD 02/15/2018, 9:27 AM

## 2018-02-15 ENCOUNTER — Ambulatory Visit (INDEPENDENT_AMBULATORY_CARE_PROVIDER_SITE_OTHER): Payer: Medicare Other | Admitting: Psychiatry

## 2018-02-15 VITALS — BP 140/76 | HR 107 | Ht 60.0 in | Wt 183.0 lb

## 2018-02-15 DIAGNOSIS — F209 Schizophrenia, unspecified: Secondary | ICD-10-CM

## 2018-02-15 MED ORDER — OLANZAPINE 10 MG PO TABS: 10.0000 mg | ORAL_TABLET | Freq: Every day | ORAL | 0 refills | Status: DC

## 2018-02-15 MED ORDER — DIVALPROEX SODIUM ER 250 MG PO TB24: 750.0000 mg | ORAL_TABLET | Freq: Every day | ORAL | 0 refills | Status: DC

## 2018-02-15 MED ORDER — RISPERIDONE 2 MG PO TABS: 2.0000 mg | ORAL_TABLET | Freq: Two times a day (BID) | ORAL | 0 refills | Status: DC

## 2018-02-15 MED ORDER — TRAZODONE HCL 50 MG PO TABS: 50.0000 mg | ORAL_TABLET | Freq: Every day | ORAL | 0 refills | Status: DC

## 2018-02-15 NOTE — Patient Instructions (Signed)
1.Continuevalproic acid 750 mg at night  2. Continuerisperidone 2 mgtwice a day 3 Continue olanzapine 10 mgat night 4. Continue Trazodone 50 mg at night 5. Return to clinic in three months for 15 mins

## 2018-02-16 LAB — VALPROIC ACID LEVEL: Valproic Acid Lvl: 86.5 mg/L (ref 50.0–100.0)

## 2018-04-02 DIAGNOSIS — R4182 Altered mental status, unspecified: Secondary | ICD-10-CM | POA: Diagnosis not present

## 2018-04-02 DIAGNOSIS — Z79899 Other long term (current) drug therapy: Secondary | ICD-10-CM | POA: Diagnosis not present

## 2018-04-02 DIAGNOSIS — Z7984 Long term (current) use of oral hypoglycemic drugs: Secondary | ICD-10-CM | POA: Diagnosis not present

## 2018-04-02 DIAGNOSIS — R4020 Unspecified coma: Secondary | ICD-10-CM | POA: Diagnosis not present

## 2018-04-02 DIAGNOSIS — R61 Generalized hyperhidrosis: Secondary | ICD-10-CM | POA: Diagnosis not present

## 2018-04-02 DIAGNOSIS — N3000 Acute cystitis without hematuria: Secondary | ICD-10-CM | POA: Diagnosis not present

## 2018-04-02 DIAGNOSIS — E871 Hypo-osmolality and hyponatremia: Secondary | ICD-10-CM | POA: Diagnosis not present

## 2018-04-02 DIAGNOSIS — R55 Syncope and collapse: Secondary | ICD-10-CM | POA: Diagnosis not present

## 2018-04-02 DIAGNOSIS — R1084 Generalized abdominal pain: Secondary | ICD-10-CM | POA: Diagnosis not present

## 2018-04-10 DIAGNOSIS — E871 Hypo-osmolality and hyponatremia: Secondary | ICD-10-CM | POA: Diagnosis not present

## 2018-04-10 DIAGNOSIS — N390 Urinary tract infection, site not specified: Secondary | ICD-10-CM | POA: Diagnosis not present

## 2018-04-10 DIAGNOSIS — E1165 Type 2 diabetes mellitus with hyperglycemia: Secondary | ICD-10-CM | POA: Diagnosis not present

## 2018-04-10 DIAGNOSIS — R55 Syncope and collapse: Secondary | ICD-10-CM | POA: Diagnosis not present

## 2018-04-17 DIAGNOSIS — E1142 Type 2 diabetes mellitus with diabetic polyneuropathy: Secondary | ICD-10-CM | POA: Diagnosis not present

## 2018-04-17 DIAGNOSIS — I739 Peripheral vascular disease, unspecified: Secondary | ICD-10-CM | POA: Diagnosis not present

## 2018-04-17 DIAGNOSIS — B351 Tinea unguium: Secondary | ICD-10-CM | POA: Diagnosis not present

## 2018-05-10 NOTE — Progress Notes (Signed)
BH MD/PA/NP OP Progress Note  05/17/2018 10:32 AM Maria Pace  MRN:  [id number]  Chief Complaint:  Chief Complaint    Follow-up; Schizophrenia     HPI:  Patient presents for follow-up appointment for schizophrenia.  She states that she would like to cut down the dose of Depakote as she believes it made her feel drowsy during the day (she did not have this complaints in prior visits despite she has been on the same dose).  She denies when the staff told Maria Pace that she has been awake at night.  Drowsy denies feeling depressed or anxiety.  Maria Pace denies SI, HI, AH, VH.  She denies paranoia, or ideas of reference.   The staff at the group home presents to the interview.  Maria Pace cleans the room.  Although she may have "outburst" of becoming verbally loud, she has not displayed any behavioral issues.  This behavior has been unchanged since the last visit.  Maria Pace was admitted to the hospital, and she was seen by cardiology.   Reviewed  paperwork from the hospital, she was admitted for syncope, hyponatremia, UTI, vasovagal episode. Labs as below. (repeated labs were not available)  04/02/2018 BUN 16, Cre 1.22 Na 124 LFT wnl   Wt Readings from Last 3 Encounters:  05/17/18 188 lb (85.3 kg)  02/15/18 183 lb (83 kg)  11/15/17 183 lb (83 kg)    Visit Diagnosis:    ICD-10-CM   1. Schizophrenia, unspecified type (HCC) F20.9 Comprehensive Metabolic Panel (CMET)    Past Psychiatric History: Please see initial evaluation for full details. I have reviewed the history. No updates at this time.     Past Medical History:  Past Medical History:  Diagnosis Date  . Bipolar 1 disorder (HCC)   . Essential hypertension   . GERD (gastroesophageal reflux disease)   . Hyperlipidemia   . Irregular heart beat   . Schizophrenia (HCC)   . Type 2 diabetes mellitus (HCC)     Past Surgical History:  Procedure Laterality Date  . YAG LASER APPLICATION Left 01/06/2015   Procedure: YAG LASER  APPLICATION;  Surgeon: Jethro Bolus, MD;  Location: [facility];  Service: Ophthalmology;  Laterality: Left;    Family Psychiatric History: Please see initial evaluation for full details. I have reviewed the history. No updates at this time.     Family History: No family history on file.  Social History:  Social History   Socioeconomic History  . Marital status: Single    Spouse name: Not on file  . Number of children: Not on file  . Years of education: Not on file  . Highest education level: Not on file  Occupational History  . Not on file  Social Needs  . Financial resource strain: Not on file  . Food insecurity:    Worry: Not on file    Inability: Not on file  . Transportation needs:    Medical: Not on file    Non-medical: Not on file  Tobacco Use  . Smoking status: Never Smoker  . Smokeless tobacco: Never Used  Substance and Sexual Activity  . Alcohol use: No  . Drug use: No  . Sexual activity: Never  Lifestyle  . Physical activity:    Days per week: Not on file    Minutes per session: Not on file  . Stress: Not on file  Relationships  . Social connections:    Talks on phone: Not on file    Gets together: Not on file  Attends religious service: Not on file    Active member of club or organization: Not on file    Attends meetings of clubs or organizations: Not on file    Relationship status: Not on file  Other Topics Concern  . Not on file  Social History Narrative  . Not on file    Allergies:  Allergies  Allergen Reactions  . Haloperidol Lactate Other (See Comments)    Couldn't urinate anymore    Metabolic Disorder Labs: Lab Results  Component Value Date   HGBA1C 11.0 (H) 10/28/2013   No results found for: PROLACTIN Lab Results  Component Value Date   CHOL 204 (H) 10/30/2013   TRIG 240 (H) 10/30/2013   HDL 38 (L) 10/30/2013   VLDL 48 (H) 10/30/2013   LDLCALC 118 (H) 10/30/2013   Lab Results  Component Value Date   TSH 2.634 05/18/2017    TSH 2.084 11/10/2016    Therapeutic Level Labs: No results found for: LITHIUM Lab Results  Component Value Date   VALPROATE 86.5 02/15/2018   VALPROATE 76.2 06/28/2017   No components found for:  CBMZ  Current Medications: Current Outpatient Medications  Medication Sig Dispense Refill  . acetaminophen (TYLENOL) 500 MG tablet Take 500 mg by mouth every 8 (eight) hours as needed for mild pain or moderate pain.    Marland Kitchen. albuterol (PROAIR HFA) 108 (90 Base) MCG/ACT inhaler Inhale 2 puffs into the lungs every 6 (six) hours as needed for wheezing or shortness of breath.    Marland Kitchen. atorvastatin (LIPITOR) 10 MG tablet Take 10 mg by mouth daily.    . bisacodyl (DULCOLAX) 10 MG suppository Place 1 suppository (10 mg total) rectally as needed for moderate constipation. 12 suppository 0  . Camphor-Eucalyptus-Menthol (VICKS VAPORUB) 4.7-1.2-2.6 % OINT Apply 1 application topically daily. Apply once to toenails daily    . diclofenac sodium (VOLTAREN) 1 % GEL Apply 2 g topically 2 (two) times daily as needed.    . divalproex (DEPAKOTE ER) 250 MG 24 hr tablet Take 3 tablets (750 mg total) by mouth daily. 270 tablet 0  . docusate sodium (COLACE) 100 MG capsule Take 200 mg by mouth daily.    . hydrochlorothiazide (MICROZIDE) 12.5 MG capsule Take 12.5 mg by mouth daily.    Marland Kitchen. latanoprost (XALATAN) 0.005 % ophthalmic solution Place 1 drop into both eyes at bedtime.    Marland Kitchen. linagliptin (TRADJENTA) 5 MG TABS tablet Take 5 mg by mouth daily.    Marland Kitchen. lisinopril (PRINIVIL,ZESTRIL) 20 MG tablet Take 20 mg by mouth daily.    Marland Kitchen. loratadine (CLARITIN) 10 MG tablet Take 10 mg by mouth daily.    . metFORMIN (GLUCOPHAGE) 500 MG tablet Take 500 mg by mouth 2 (two) times daily with a meal.    . metoprolol tartrate (LOPRESSOR) 50 MG tablet Take 50 mg by mouth 2 (two) times daily.    Marland Kitchen. OLANZapine (ZYPREXA) 10 MG tablet Take 1 tablet (10 mg total) by mouth at bedtime. 90 tablet 0  . pantoprazole (PROTONIX) 40 MG tablet Take 40 mg by mouth  daily.    . polyethylene glycol powder (GLYCOLAX/MIRALAX) powder Take 17 g by mouth daily as needed for mild constipation or moderate constipation.    . risperiDONE (RISPERDAL) 2 MG tablet 1 mg in the morning, 2 mg at night 90 tablet 0  . senna-docusate (SENOKOT-S) 8.6-50 MG tablet Take 2 tablets by mouth at bedtime. 60 tablet 0  . traZODone (DESYREL) 50 MG tablet Take 1 tablet (50  mg total) by mouth at bedtime. 90 tablet 0  . risperiDONE (RISPERDAL) 1 MG tablet 1 mg in the morning, 2 mg at night 90 tablet 0   No current facility-administered medications for this visit.      Musculoskeletal: Strength & Muscle Tone: within normal limits Gait & Station: normal Patient leans: N/A  Psychiatric Specialty Exam: Review of Systems  Psychiatric/Behavioral: Negative for depression, hallucinations, memory loss, substance abuse and suicidal ideas. The patient has insomnia. The patient is not nervous/anxious.   All other systems reviewed and are negative.   Blood pressure 119/87, pulse 79, height 5' (1.524 m), weight 188 lb (85.3 kg), SpO2 98 %.Body mass index is 36.72 kg/m.  General Appearance: Fairly Groomed  Eye Contact:  Good  Speech:  Clear and Coherent  Volume:  Normal  Mood:  "fine"  Affect:  Appropriate, Congruent and Restricted  Thought Process:  Coherent  Orientation:  Full (Time, Place, and Person)  Thought Content: Logical   Suicidal Thoughts:  No  Homicidal Thoughts:  No  Memory:  Immediate;   Good  Judgement:  Fair  Insight:  Shallow  Psychomotor Activity:  Normal  Concentration:  Concentration: Fair and Attention Span: Fair  Recall:  Poor  Fund of Knowledge: Good  Language: Good  Akathisia:  No  Handed:  Right  AIMS (if indicated): not done  Assets:  Desire for Improvement Social Support  ADL's:  Intact  Cognition: Impaired,  Mild  Sleep:  Poor   Screenings: Mini cog- "cat" 1/3, clock- 1/3 (draw circle, putnumbers of12,5,10,13..), orientation 4/5 (except date)  on 10/2017  Assessment and Plan:  Maria Pace is a 78 y.o. year old female with a history of schizophrenia, hypertension, type II diabetes, who presents for follow up appointment for Schizophrenia, unspecified type (HCC) - Plan: Comprehensive Metabolic Panel (CMET)  # Schizophrenia Although patient will ruminates on drowsiness, which she attributes to Depakote, she demonstrates linear thought process during the interview.  The staff denies significant behavioral issues except occasional irritability.  Will taper down Risperdal in the morning dose to avoid drowsiness.  The staff is notified to contact the clinic if there is any worsening in behavior issues.  Will continue Risperdal and olanzapine to target schizophrenia.  Discussed potential metabolic side effect.  We will plan to gradually taper down to consolidate to one antipsychotics to avoid polypharmacy.  Will continue trazodone for sleep.   # Cognitive deficits She has cognitive deficits as characterized by mini cog.  Will continue to monitor.   # Hyponatremia According to the paperwork, she was admitted to the hospital for hyponatremia with syncope, UTI.  Will recheck sodium and treat accordingly.   Plan I have reviewed and updated plans as below 1.Continuevalproic acid 750 mg at night 2. Decrease risperidone 1 mg in the morning, 2 mg at night  3 Continue olanzapine 10 mgat night 4. Continue Trazodone 50 mg at night 5. Return to clinic in three months for 15 mins Check CMP for monitoring hyponatremia  The patient demonstrates the following risk factors for suicide: Chronic risk factors for suicide include:psychiatric disorder ofschizophrenia. Acute risk factorsfor suicide include: unemployment. Protective factorsfor this patient include: positive social support. Considering these factors, the overall suicide risk at this point appears to below. Patientisappropriate for outpatient follow up.  Neysa Hottereina Koralynn Greenspan,  MD 05/17/2018, 10:32 AM

## 2018-05-17 ENCOUNTER — Encounter (HOSPITAL_COMMUNITY): Payer: Self-pay | Admitting: Psychiatry

## 2018-05-17 ENCOUNTER — Ambulatory Visit (INDEPENDENT_AMBULATORY_CARE_PROVIDER_SITE_OTHER): Payer: Medicare Other | Admitting: Psychiatry

## 2018-05-17 VITALS — BP 119/87 | HR 79 | Ht 60.0 in | Wt 188.0 lb

## 2018-05-17 DIAGNOSIS — F209 Schizophrenia, unspecified: Secondary | ICD-10-CM | POA: Diagnosis not present

## 2018-05-17 MED ORDER — OLANZAPINE 10 MG PO TABS: 10.0000 mg | ORAL_TABLET | Freq: Every day | ORAL | 0 refills | Status: DC

## 2018-05-17 MED ORDER — RISPERIDONE 2 MG PO TABS: ORAL_TABLET | ORAL | 0 refills | Status: DC

## 2018-05-17 MED ORDER — TRAZODONE HCL 50 MG PO TABS: 50.0000 mg | ORAL_TABLET | Freq: Every day | ORAL | 0 refills | Status: DC

## 2018-05-17 MED ORDER — RISPERIDONE 1 MG PO TABS: ORAL_TABLET | ORAL | 0 refills | Status: DC

## 2018-05-17 NOTE — Patient Instructions (Signed)
1.Continuevalproic acid 750 mg at night 2. Decrease risperidone 1 mg in the morning, 2 mg at night  3 Continue olanzapine 10 mgat night 4. Continue Trazodone 50 mg at night 5. Return to clinic in three months for 15 mins

## 2018-05-18 ENCOUNTER — Encounter (HOSPITAL_COMMUNITY): Payer: Self-pay | Admitting: Psychiatry

## 2018-05-18 ENCOUNTER — Telehealth (HOSPITAL_COMMUNITY): Payer: Self-pay | Admitting: Psychiatry

## 2018-05-18 LAB — COMPREHENSIVE METABOLIC PANEL
AG RATIO: 1.3 (calc) (ref 1.0–2.5)
ALBUMIN MSPROF: 3.7 g/dL (ref 3.6–5.1)
ALKALINE PHOSPHATASE (APISO): 59 U/L (ref 37–153)
ALT: 6 U/L (ref 6–29)
AST: 9 U/L — ABNORMAL LOW (ref 10–35)
BUN/Creatinine Ratio: 10 (calc) (ref 6–22)
BUN: 14 mg/dL (ref 7–25)
CHLORIDE: 92 mmol/L — AB (ref 98–110)
CO2: 25 mmol/L (ref 20–32)
CREATININE: 1.34 mg/dL — AB (ref 0.60–0.93)
Calcium: 9 mg/dL (ref 8.6–10.4)
GLOBULIN: 2.9 g/dL (ref 1.9–3.7)
Glucose, Bld: 115 mg/dL (ref 65–139)
POTASSIUM: 4.4 mmol/L (ref 3.5–5.3)
Sodium: 128 mmol/L — ABNORMAL LOW (ref 135–146)
Total Bilirubin: 0.3 mg/dL (ref 0.2–1.2)
Total Protein: 6.6 g/dL (ref 6.1–8.1)

## 2018-05-18 NOTE — Telephone Encounter (Signed)
Could you contact the group home about the blood test result. Sodium level is still low (Na 128), and there is slight worsening in kidney function (Cre 1.34). Please recommend them to have the patient being evaluated/treated by her primary care doctor. Will send a letter to them as well.

## 2018-05-18 NOTE — Telephone Encounter (Signed)
Spoke with Doris @ group home [facility] & informed to Lab results & to follow up with PCP  Sodium (Na 128) &  Kidney function (Cre 1.34)

## 2018-06-04 ENCOUNTER — Telehealth (HOSPITAL_COMMUNITY): Payer: Self-pay | Admitting: *Deleted

## 2018-06-04 ENCOUNTER — Other Ambulatory Visit (HOSPITAL_COMMUNITY): Payer: Self-pay | Admitting: Psychiatry

## 2018-06-04 DIAGNOSIS — H524 Presbyopia: Secondary | ICD-10-CM | POA: Diagnosis not present

## 2018-06-04 MED ORDER — DIVALPROEX SODIUM ER 250 MG PO TB24: 750.0000 mg | ORAL_TABLET | Freq: Every day | ORAL | 0 refills | Status: DC

## 2018-06-04 NOTE — Telephone Encounter (Signed)
Dr [facility] called stating they have been sending refill request electronically. I suggested they use our fax #. Requesting refill on Depakote 250 mg ER

## 2018-06-04 NOTE — Telephone Encounter (Signed)
ordered

## 2018-06-05 DIAGNOSIS — E119 Type 2 diabetes mellitus without complications: Secondary | ICD-10-CM | POA: Diagnosis not present

## 2018-06-05 DIAGNOSIS — I10 Essential (primary) hypertension: Secondary | ICD-10-CM | POA: Diagnosis not present

## 2018-06-05 DIAGNOSIS — E871 Hypo-osmolality and hyponatremia: Secondary | ICD-10-CM | POA: Diagnosis not present

## 2018-06-05 DIAGNOSIS — F315 Bipolar disorder, current episode depressed, severe, with psychotic features: Secondary | ICD-10-CM | POA: Diagnosis not present

## 2018-06-13 ENCOUNTER — Other Ambulatory Visit: Payer: Self-pay

## 2018-06-13 ENCOUNTER — Emergency Department (HOSPITAL_COMMUNITY)
Admission: EM | Admit: 2018-06-13 | Discharge: 2018-06-13 | Disposition: A | Discharge status: Home / Self Care | Payer: Medicare Other | Attending: Emergency Medicine | Admitting: Emergency Medicine

## 2018-06-13 ENCOUNTER — Emergency Department (HOSPITAL_COMMUNITY): Payer: Medicare Other

## 2018-06-13 ENCOUNTER — Encounter (HOSPITAL_COMMUNITY): Payer: Self-pay

## 2018-06-13 DIAGNOSIS — E871 Hypo-osmolality and hyponatremia: Secondary | ICD-10-CM | POA: Insufficient documentation

## 2018-06-13 DIAGNOSIS — I959 Hypotension, unspecified: Secondary | ICD-10-CM | POA: Diagnosis not present

## 2018-06-13 DIAGNOSIS — Z79899 Other long term (current) drug therapy: Secondary | ICD-10-CM | POA: Diagnosis not present

## 2018-06-13 DIAGNOSIS — R0689 Other abnormalities of breathing: Secondary | ICD-10-CM | POA: Diagnosis not present

## 2018-06-13 DIAGNOSIS — Z7984 Long term (current) use of oral hypoglycemic drugs: Secondary | ICD-10-CM | POA: Diagnosis not present

## 2018-06-13 DIAGNOSIS — R55 Syncope and collapse: Secondary | ICD-10-CM | POA: Diagnosis not present

## 2018-06-13 DIAGNOSIS — R61 Generalized hyperhidrosis: Secondary | ICD-10-CM | POA: Diagnosis not present

## 2018-06-13 DIAGNOSIS — E119 Type 2 diabetes mellitus without complications: Secondary | ICD-10-CM | POA: Insufficient documentation

## 2018-06-13 DIAGNOSIS — R0902 Hypoxemia: Secondary | ICD-10-CM | POA: Diagnosis not present

## 2018-06-13 DIAGNOSIS — I10 Essential (primary) hypertension: Secondary | ICD-10-CM | POA: Insufficient documentation

## 2018-06-13 DIAGNOSIS — R531 Weakness: Secondary | ICD-10-CM | POA: Diagnosis not present

## 2018-06-13 LAB — COMPREHENSIVE METABOLIC PANEL
ALT: 11 U/L (ref 0–44)
AST: 14 U/L — ABNORMAL LOW (ref 15–41)
Albumin: 3.2 g/dL — ABNORMAL LOW (ref 3.5–5.0)
Alkaline Phosphatase: 46 U/L (ref 38–126)
Anion gap: 13 (ref 5–15)
BUN: 17 mg/dL (ref 8–23)
CO2: 21 mmol/L — ABNORMAL LOW (ref 22–32)
Calcium: 8.6 mg/dL — ABNORMAL LOW (ref 8.9–10.3)
Chloride: 93 mmol/L — ABNORMAL LOW (ref 98–111)
Creatinine, Ser: 1.29 mg/dL — ABNORMAL HIGH (ref 0.44–1.00)
GFR calc Af Amer: 47 mL/min — ABNORMAL LOW (ref >60)
GFR calc non Af Amer: 40 mL/min — ABNORMAL LOW (ref >60)
Glucose, Bld: 107 mg/dL — ABNORMAL HIGH (ref 70–99)
Potassium: 3.9 mmol/L (ref 3.5–5.1)
Sodium: 127 mmol/L — ABNORMAL LOW (ref 135–145)
Total Bilirubin: 0.2 mg/dL — ABNORMAL LOW (ref 0.3–1.2)
Total Protein: 6.7 g/dL (ref 6.5–8.1)

## 2018-06-13 LAB — CBC WITH DIFFERENTIAL/PLATELET
Abs Immature Granulocytes: 0.06 10*3/uL (ref 0.00–0.07)
Basophils Absolute: 0.1 10*3/uL (ref 0.0–0.1)
Basophils Relative: 1 %
Eosinophils Absolute: 0.1 10*3/uL (ref 0.0–0.5)
Eosinophils Relative: 1 %
HCT: 34.3 % — ABNORMAL LOW (ref 36.0–46.0)
HEMOGLOBIN: 10.7 g/dL — AB (ref 12.0–15.0)
Immature Granulocytes: 1 %
Lymphocytes Relative: 24 %
Lymphs Abs: 2.6 10*3/uL (ref 0.7–4.0)
MCH: 28.9 pg (ref 26.0–34.0)
MCHC: 31.2 g/dL (ref 30.0–36.0)
MCV: 92.7 fL (ref 80.0–100.0)
Monocytes Absolute: 1.1 10*3/uL — ABNORMAL HIGH (ref 0.1–1.0)
Monocytes Relative: 10 %
Neutro Abs: 6.8 10*3/uL (ref 1.7–7.7)
Neutrophils Relative %: 63 %
Platelets: 144 10*3/uL — ABNORMAL LOW (ref 150–400)
RBC: 3.7 MIL/uL — AB (ref 3.87–5.11)
RDW: 13.5 % (ref 11.5–15.5)
WBC: 10.7 10*3/uL — AB (ref 4.0–10.5)
nRBC: 0 % (ref 0.0–0.2)

## 2018-06-13 LAB — BASIC METABOLIC PANEL
Anion gap: 11 (ref 5–15)
BUN: 17 mg/dL (ref 8–23)
CO2: 23 mmol/L (ref 22–32)
Calcium: 8.8 mg/dL — ABNORMAL LOW (ref 8.9–10.3)
Chloride: 92 mmol/L — ABNORMAL LOW (ref 98–111)
Creatinine, Ser: 1.25 mg/dL — ABNORMAL HIGH (ref 0.44–1.00)
GFR calc Af Amer: 49 mL/min — ABNORMAL LOW (ref >60)
GFR calc non Af Amer: 42 mL/min — ABNORMAL LOW (ref >60)
Glucose, Bld: 164 mg/dL — ABNORMAL HIGH (ref 70–99)
Potassium: 4.1 mmol/L (ref 3.5–5.1)
Sodium: 126 mmol/L — ABNORMAL LOW (ref 135–145)

## 2018-06-13 LAB — URINALYSIS, ROUTINE W REFLEX MICROSCOPIC
Bacteria, UA: NONE SEEN
Bilirubin Urine: NEGATIVE
Glucose, UA: NEGATIVE mg/dL
Ketones, ur: NEGATIVE mg/dL
NITRITE: NEGATIVE
Protein, ur: NEGATIVE mg/dL
Specific Gravity, Urine: 1.008 (ref 1.005–1.030)
pH: 7 (ref 5.0–8.0)

## 2018-06-13 LAB — VALPROIC ACID LEVEL: Valproic Acid Lvl: 64 ug/mL (ref 50.0–100.0)

## 2018-06-13 LAB — LACTIC ACID, PLASMA
Lactic Acid, Venous: 2.6 mmol/L (ref 0.5–1.9)
Lactic Acid, Venous: 2.3 mmol/L (ref 0.5–1.9)

## 2018-06-13 LAB — TROPONIN I: Troponin I: <0.03 ng/mL (ref <0.03)

## 2018-06-13 MED ORDER — SODIUM CHLORIDE 0.9 % IV BOLUS
500.0000 mL | Freq: Once | INTRAVENOUS | Status: AC
  Administered 2018-06-13: 500 mL via INTRAVENOUS

## 2018-06-13 NOTE — ED Notes (Signed)
Gave pt Sprite Zero.

## 2018-06-13 NOTE — Discharge Instructions (Addendum)
Your sodium is still low but appears stable over the last month.  Follow-up with Dr. Felecia Shelling and your psychiatrist because you may need adjustment of your medications.  Return for worsening symptoms.

## 2018-06-13 NOTE — ED Notes (Signed)
Date and time results received: 06/13/18 1134 (use smartphrase ".now" to insert current time)  Test: lac acid Critical Value: 2.3  Name of Provider Notified: pickering  Orders Received? Or Actions Taken?: see chart

## 2018-06-13 NOTE — ED Notes (Signed)
Date and time results received: 06/13/18 0958 (use smartphrase ".now" to insert current time)  Test: lac acid Critical Value: 2.6  Name of Provider Notified: pickering  Orders Received? Or Actions Taken?: see emar

## 2018-06-13 NOTE — ED Notes (Signed)
Patient was put on a pur wick.

## 2018-06-13 NOTE — ED Triage Notes (Signed)
EMS reports [facility] home.  Reports pt was hypotensive, bp 80's systolic.  Pt alert and oriented.  Reports pt was diaphoretic.  EMS started IV fluid and bp 80/56.  EMS says was called for near syncope, low bp, and low blood sugar.  CBG 98 at facility and 125 with ems.  Reports staff says pt was altered and "mouth made a funny shape."  EMS says pt has not been altered with them.

## 2018-06-13 NOTE — ED Notes (Signed)
Patient's facility, [facility], is coming to get patient .

## 2018-06-13 NOTE — ED Provider Notes (Signed)
[facility] EMERGENCY DEPARTMENT Provider Note   CSN: [id number] Arrival date & time: 06/13/18  5208    History   Chief Complaint Chief Complaint  Patient presents with   Hypotension    HPI Maria Pace is a 78 y.o. female.     HPI Patient presents from family care home.  Reportedly had hypotension and diaphoresis.  Blood pressure in the 80s.  Had a fluid bolus and feels better.  Question of low blood sugar also.  Patient states she does not feel so good but really cannot quantify.  Did not have breakfast today.  States he did not feed her.  No chest pain.  No abdominal pain.  No nausea vomiting.  No fevers.  No recent change in her medications.  Reportedly blood pressure is improved. Past Medical History:  Diagnosis Date   Bipolar 1 disorder (HCC)    Essential hypertension    GERD (gastroesophageal reflux disease)    Hyperlipidemia    Irregular heart beat    Schizophrenia (HCC)    Type 2 diabetes mellitus (HCC)     Patient Active Problem List   Diagnosis Date Noted   Schizophrenia (HCC) 11/15/2017   Hypotension 02/20/2017   Severe dehydration 02/20/2017   AKI (acute kidney injury) (HCC) 02/20/2017   Constipation 02/20/2017   Lactic acidosis 02/20/2017   Urinary retention 11/10/2016   Hypertension 11/10/2016   Diabetes mellitus without complication (HCC) 11/10/2016   High cholesterol 11/10/2016   Bipolar 1 disorder (HCC) 11/10/2016   Hyponatremia 11/10/2016   SBO (small bowel obstruction) (HCC) 11/03/2015    Past Surgical History:  Procedure Laterality Date   YAG LASER APPLICATION Left 01/06/2015   Procedure: YAG LASER APPLICATION;  Surgeon: Jethro Bolus, MD;  Location: [facility];  Service: Ophthalmology;  Laterality: Left;     OB History   No obstetric history on file.      Home Medications    Prior to Admission medications   Medication Sig Start Date End Date Taking? Authorizing Provider  acetaminophen (TYLENOL) 500 MG tablet  Take 500 mg by mouth every 8 (eight) hours as needed for mild pain or moderate pain.   Yes [provider]  albuterol (PROAIR HFA) 108 (90 Base) MCG/ACT inhaler Inhale 2 puffs into the lungs every 6 (six) hours as needed for wheezing or shortness of breath.   Yes [provider]  atorvastatin (LIPITOR) 10 MG tablet Take 10 mg by mouth daily.   Yes [provider]  bisacodyl (DULCOLAX) 10 MG suppository Place 1 suppository (10 mg total) rectally as needed for moderate constipation. 02/23/17  Yes Memon, Durward Mallard, MD  Camphor-Eucalyptus-Menthol (VICKS VAPORUB) 4.7-1.2-2.6 % OINT Apply 1 application topically daily. Apply once to toenails daily   Yes [provider]  diclofenac sodium (VOLTAREN) 1 % GEL Apply 2 g topically 2 (two) times daily as needed.   Yes [provider]  divalproex (DEPAKOTE ER) 250 MG 24 hr tablet Take 3 tablets (750 mg total) by mouth daily. 06/04/18  Yes Hisada, Barbee Cough, MD  docusate sodium (COLACE) 100 MG capsule Take 200 mg by mouth daily.   Yes [provider]  hydrochlorothiazide (MICROZIDE) 12.5 MG capsule Take 12.5 mg by mouth daily.   Yes [provider]  linagliptin (TRADJENTA) 5 MG TABS tablet Take 5 mg by mouth daily.   Yes [provider]  lisinopril (PRINIVIL,ZESTRIL) 20 MG tablet Take 20 mg by mouth daily.   Yes [provider]  loratadine (CLARITIN) 10 MG  tablet Take 10 mg by mouth daily.   Yes [provider]  metFORMIN (GLUCOPHAGE) 500 MG tablet Take 500 mg by mouth 2 (two) times daily with a meal.   Yes [provider]  metoprolol tartrate (LOPRESSOR) 50 MG tablet Take 50 mg by mouth 2 (two) times daily.   Yes [provider]  OLANZapine (ZYPREXA) 10 MG tablet Take 1 tablet (10 mg total) by mouth at bedtime. 05/17/18  Yes Hisada, Barbee Cough, MD  pantoprazole (PROTONIX) 40 MG tablet Take 40 mg by mouth daily.   Yes [provider]  polyethylene glycol powder  (GLYCOLAX/MIRALAX) powder Take 17 g by mouth daily as needed for mild constipation or moderate constipation.   Yes [provider]  risperiDONE (RISPERDAL) 1 MG tablet 1 mg in the morning, 2 mg at night 05/17/18  Yes Hisada, Reina, MD  risperiDONE (RISPERDAL) 2 MG tablet 1 mg in the morning, 2 mg at night 05/17/18  Yes Hisada, Reina, MD  senna-docusate (SENOKOT-S) 8.6-50 MG tablet Take 2 tablets by mouth at bedtime. 02/23/17  Yes Erick Blinks, MD  traZODone (DESYREL) 50 MG tablet Take 1 tablet (50 mg total) by mouth at bedtime. 05/17/18  Yes Hisada, Barbee Cough, MD  latanoprost (XALATAN) 0.005 % ophthalmic solution Place 1 drop into both eyes at bedtime.    [provider]    Family History No family history on file.  Social History Social History   Tobacco Use   Smoking status: Never Smoker   Smokeless tobacco: Never Used  Substance Use Topics   Alcohol use: No   Drug use: No     Allergies   Haloperidol lactate   Review of Systems Review of Systems  Constitutional: Positive for diaphoresis. Negative for appetite change.  HENT: Negative for congestion.   Respiratory: Negative for shortness of breath.   Cardiovascular: Negative for chest pain.  Gastrointestinal: Negative for abdominal pain.  Genitourinary: Negative for flank pain.  Musculoskeletal: Negative for back pain.  Skin: Negative for rash.  Neurological: Negative for weakness.  Psychiatric/Behavioral: Negative for confusion.     Physical Exam Updated Vital Signs BP 121/64    Pulse 67    Temp (!) 97.5 F (36.4 C) (Rectal)    Resp 20    Wt 87.1 kg    SpO2 95%    BMI 37.50 kg/m   Physical Exam HENT:     Head: Atraumatic.     Mouth/Throat:     Mouth: Mucous membranes are moist.  Neck:     Musculoskeletal: Neck supple.  Cardiovascular:     Rate and Rhythm: Normal rate.     Pulses: Normal pulses.  Pulmonary:     Breath sounds: Normal breath sounds. No wheezing, rhonchi or rales.  Abdominal:      Tenderness: There is no abdominal tenderness.  Musculoskeletal:     Right lower leg: No edema.     Left lower leg: No edema.  Skin:    General: Skin is warm.     Capillary Refill: Capillary refill takes less than 2 seconds.  Neurological:     General: No focal deficit present.     Mental Status: She is alert and oriented to person, place, and time.      ED Treatments / Results  Labs (all labs ordered are listed, but only abnormal results are displayed) Labs Reviewed  LACTIC ACID, PLASMA - Abnormal; Notable for the following components:      Result Value   Lactic Acid, Venous 2.6 (*)  All other components within normal limits  LACTIC ACID, PLASMA - Abnormal; Notable for the following components:   Lactic Acid, Venous 2.3 (*)    All other components within normal limits  COMPREHENSIVE METABOLIC PANEL - Abnormal; Notable for the following components:   Sodium 127 (*)    Chloride 93 (*)    CO2 21 (*)    Glucose, Bld 107 (*)    Creatinine, Ser 1.29 (*)    Calcium 8.6 (*)    Albumin 3.2 (*)    AST 14 (*)    Total Bilirubin 0.2 (*)    GFR calc non Af Amer 40 (*)    GFR calc Af Amer 47 (*)    All other components within normal limits  CBC WITH DIFFERENTIAL/PLATELET - Abnormal; Notable for the following components:   WBC 10.7 (*)    RBC 3.70 (*)    Hemoglobin 10.7 (*)    HCT 34.3 (*)    Platelets 144 (*)    Monocytes Absolute 1.1 (*)    All other components within normal limits  URINALYSIS, ROUTINE W REFLEX MICROSCOPIC - Abnormal; Notable for the following components:   Hgb urine dipstick SMALL (*)    Leukocytes,Ua TRACE (*)    All other components within normal limits  BASIC METABOLIC PANEL - Abnormal; Notable for the following components:   Sodium 126 (*)    Chloride 92 (*)    Glucose, Bld 164 (*)    Creatinine, Ser 1.25 (*)    Calcium 8.8 (*)    GFR calc non Af Amer 42 (*)    GFR calc Af Amer 49 (*)    All other components within normal limits  TROPONIN I    VALPROIC ACID LEVEL    EKG EKG Interpretation  Date/Time:  Wednesday June 13 2018 09:00:01 EDT Ventricular Rate:  65 PR Interval:    QRS Duration: 113 QT Interval:  429 QTC Calculation: 447 R Axis:   -40 Text Interpretation:  Sinus rhythm Incomplete left bundle branch block Consider anterior infarct Confirmed by Benjiman Core [phone]) on 06/13/2018 9:07:12 AM   Radiology Dg Chest 2 View  Result Date: 06/13/2018 CLINICAL DATA:  Generalized weakness EXAM: CHEST - 2 VIEW COMPARISON:  April 02, 2018 FINDINGS: There is no appreciable edema or consolidation. Heart is mildly enlarged with pulmonary vascularity normal. No adenopathy. There is degenerative change in the thoracic spine. IMPRESSION: Mild cardiac prominence.  No edema or consolidation. Electronically Signed   By: Bretta Bang III M.D.   On: 06/13/2018 09:33    Procedures Procedures (including critical care time)  Medications Ordered in ED Medications  sodium chloride 0.9 % bolus 500 mL (0 mLs Intravenous Stopped 06/13/18 1000)     Initial Impression / Assessment and Plan / ED Course  I have reviewed the triage vital signs and the nursing notes.  Pertinent labs & imaging results that were available during my care of the patient were reviewed by me and considered in my medical decision making (see chart for details).        Patient with dizziness and hypotension.  Hypotension resolved.  Feels much better and has tolerated lunch.  Does have hyponatremia.  Has had same.  Appears to be somewhat stable over the last month.  Needs outpatient follow-up.  May need adjustment of medications.  Depakote also has been known to cause hyponatremia/SIADH  Follow-up with PCP and potentially psychiatry.  Final Clinical Impressions(s) / ED Diagnoses   Final diagnoses:  Hyponatremia  ED Discharge Orders    None       Benjiman Core, MD 06/13/18 1525

## 2018-06-14 NOTE — Progress Notes (Signed)
BH MD/PA/NP OP Progress Note  06/19/2018 9:35 AM [name]  MRN:  [id number]  Chief Complaint:  Chief Complaint    Follow-up; Schizophrenia     HPI:  Patient presents for follow-up appointment for schizophrenia.  The patient recalls that she went to ED. she states that she was told by physician to adjust her medication.  The patient has been doing well otherwise.  She talks with Karleen Dolphin, who is in the nursing home. She denies feeling depressed, anxiety.  She denies SI, HI, AH, VH.   The staff reports that her BP had been low in the morning for a few days before going to ED. Today 150/101 at the facility. Maria Pace tends to be picky about what she eats, while she drinks water (not containing sodium).  Although the ED received a letter about her hyponatremia and was seen by PCP, the patient could not get another blood draw as there was funeral of the other people.  The staff agrees to get the blood test and the patient will be seen by PCP today.  The staff has not noticed any change in her behavior or communication since the last visit.  The staff also reports that the patient "perked up" at the ED after she was given fluid. The staff denies any safety concern.  - chart reviewed. Patient went to ED for hypotension, diaphoresis; BP in 80's, which improved after fluid bolus. Na 127, Uosm not available.  Visit Diagnosis:    ICD-10-CM   1. Schizophrenia, unspecified type (HCC) F20.9     Past Psychiatric History: Please see initial evaluation for full details. I have reviewed the history. No updates at this time.     Past Medical History:  Past Medical History:  Diagnosis Date  . Bipolar 1 disorder (HCC)   . Essential hypertension   . GERD (gastroesophageal reflux disease)   . Hyperlipidemia   . Irregular heart beat   . Schizophrenia (HCC)   . Type 2 diabetes mellitus (HCC)     Past Surgical History:  Procedure Laterality Date  . YAG LASER APPLICATION Left 01/06/2015   Procedure: YAG LASER APPLICATION;  Surgeon: Jethro Bolus, MD;  Location: [facility];  Service: Ophthalmology;  Laterality: Left;    Family Psychiatric History: Please see initial evaluation for full details. I have reviewed the history. No updates at this time.     Family History: No family history on file.  Social History:  Social History   Socioeconomic History  . Marital status: Single    Spouse name: Not on file  . Number of children: Not on file  . Years of education: Not on file  . Highest education level: Not on file  Occupational History  . Not on file  Social Needs  . Financial resource strain: Not on file  . Food insecurity:    Worry: Not on file    Inability: Not on file  . Transportation needs:    Medical: Not on file    Non-medical: Not on file  Tobacco Use  . Smoking status: Never Smoker  . Smokeless tobacco: Never Used  Substance and Sexual Activity  . Alcohol use: No  . Drug use: No  . Sexual activity: Never  Lifestyle  . Physical activity:    Days per week: Not on file    Minutes per session: Not on file  . Stress: Not on file  Relationships  . Social connections:    Talks on phone: Not on file  Gets together: Not on file    Attends religious service: Not on file    Active member of club or organization: Not on file    Attends meetings of clubs or organizations: Not on file    Relationship status: Not on file  Other Topics Concern  . Not on file  Social History Narrative  . Not on file    Allergies:  Allergies  Allergen Reactions  . Haloperidol Lactate Other (See Comments)    Couldn't urinate anymore    Metabolic Disorder Labs: Lab Results  Component Value Date   HGBA1C 11.0 (H) 10/28/2013   No results found for: PROLACTIN Lab Results  Component Value Date   CHOL 204 (H) 10/30/2013   TRIG 240 (H) 10/30/2013   HDL 38 (L) 10/30/2013   VLDL 48 (H) 10/30/2013   LDLCALC 118 (H) 10/30/2013   Lab Results  Component Value Date   TSH  2.634 05/18/2017   TSH 2.084 11/10/2016    Therapeutic Level Labs: No results found for: LITHIUM Lab Results  Component Value Date   VALPROATE 64 06/13/2018   VALPROATE 86.5 02/15/2018   No components found for:  CBMZ  Current Medications: Current Outpatient Medications  Medication Sig Dispense Refill  . acetaminophen (TYLENOL) 500 MG tablet Take 500 mg by mouth every 8 (eight) hours as needed for mild pain or moderate pain.    Marland Kitchen albuterol (PROAIR HFA) 108 (90 Base) MCG/ACT inhaler Inhale 2 puffs into the lungs every 6 (six) hours as needed for wheezing or shortness of breath.    Marland Kitchen atorvastatin (LIPITOR) 10 MG tablet Take 10 mg by mouth daily.    . bisacodyl (DULCOLAX) 10 MG suppository Place 1 suppository (10 mg total) rectally as needed for moderate constipation. 12 suppository 0  . Camphor-Eucalyptus-Menthol (VICKS VAPORUB) 4.7-1.2-2.6 % OINT Apply 1 application topically daily. Apply once to toenails daily    . diclofenac sodium (VOLTAREN) 1 % GEL Apply 2 g topically 2 (two) times daily as needed.    . divalproex (DEPAKOTE ER) 250 MG 24 hr tablet Take 3 tablets (750 mg total) by mouth daily. 270 tablet 0  . docusate sodium (COLACE) 100 MG capsule Take 200 mg by mouth daily.    . hydrochlorothiazide (MICROZIDE) 12.5 MG capsule Take 12.5 mg by mouth daily.    Marland Kitchen latanoprost (XALATAN) 0.005 % ophthalmic solution Place 1 drop into both eyes at bedtime.    Marland Kitchen linagliptin (TRADJENTA) 5 MG TABS tablet Take 5 mg by mouth daily.    Marland Kitchen lisinopril (PRINIVIL,ZESTRIL) 20 MG tablet Take 20 mg by mouth daily.    Marland Kitchen loratadine (CLARITIN) 10 MG tablet Take 10 mg by mouth daily.    . metFORMIN (GLUCOPHAGE) 500 MG tablet Take 500 mg by mouth 2 (two) times daily with a meal.    . metoprolol tartrate (LOPRESSOR) 50 MG tablet Take 50 mg by mouth 2 (two) times daily.    Marland Kitchen OLANZapine (ZYPREXA) 10 MG tablet Take 1 tablet (10 mg total) by mouth at bedtime. 90 tablet 0  . pantoprazole (PROTONIX) 40 MG tablet  Take 40 mg by mouth daily.    . polyethylene glycol powder (GLYCOLAX/MIRALAX) powder Take 17 g by mouth daily as needed for mild constipation or moderate constipation.    . risperiDONE (RISPERDAL) 1 MG tablet 1 mg in the morning, 2 mg at night 90 tablet 0  . risperiDONE (RISPERDAL) 2 MG tablet 1 mg in the morning, 2 mg at night 90 tablet 0  .  senna-docusate (SENOKOT-S) 8.6-50 MG tablet Take 2 tablets by mouth at bedtime. 60 tablet 0  . traZODone (DESYREL) 50 MG tablet Take 1 tablet (50 mg total) by mouth at bedtime. 90 tablet 0   No current facility-administered medications for this visit.      Musculoskeletal: Strength & Muscle Tone: within normal limits Gait & Station: normal Patient leans: N/A  Psychiatric Specialty Exam: Review of Systems  Psychiatric/Behavioral: Negative for depression, hallucinations, memory loss, substance abuse and suicidal ideas. The patient is not nervous/anxious and does not have insomnia.   All other systems reviewed and are negative.   Blood pressure (!) 149/82, pulse 86, height 5' (1.524 m), weight 189 lb (85.7 kg), SpO2 (!) 85 %.Body mass index is 36.91 kg/m.  General Appearance: Fairly Groomed  Eye Contact:  Good  Speech:  Slow, normal latency  Volume:  Normal  Mood:  "fine"  Affect:  Appropriate, Congruent and Constricted  Thought Process:  Coherent  Orientation:  Full (Time, Place, and Person)  Thought Content: Logical   Suicidal Thoughts:  No  Homicidal Thoughts:  No  Memory:  Immediate;   Good  Judgement:  Good  Insight:  Present  Psychomotor Activity:  Normal  Concentration:  Concentration: Good and Attention Span: Good  Recall:  Good  Fund of Knowledge: Good  Language: Good  Akathisia:  No  Handed:  Right  AIMS (if indicated): not done  Assets:  Communication Skills Desire for Improvement  ADL's:  Intact  Cognition: WNL  Sleep:  Good   Screenings: Mini cog- "cat" 1/3, clock- 1/3 (draw circle, putnumbers of12,5,10,13..),  orientation 4/5 (except date) on 10/2017  Assessment and Plan:  Maria Pace is a 78 y.o. year old female with a history of schizophrenia,hypertension, type II diabetes , who presents for follow up appointment for Schizophrenia, unspecified type [facility])  # Schizophrenia Exam is notable for linear thought process, which is consistent with initial evaluation.  The staff denies significant behavior issues since the last visit.  Will taper off valproic acid to avoid potential risk of hyponatremia (while the exact cause of her hyponatremia is unclear).  Will also discontinue morning dose of Risperdal to avoid orthostatic hypotension.  The staff and the patient is advised to contact the clinic if there is any worsening in behavior issues.  Will continue lower dose of Risperdal and olanzapine to target schizophrenia.  Discussed potential metabolic side effect.  Will plan to gradually consolidate to one antipsychotics in the future to avoid polypharmacy.  Will continue trazodone for insomnia.   # Cognitive deficits She does have cognitive deficits as characterized by mini cog.  Will continue to monitor.   # Hyponatremia She has had hyponatremia; she will have an appointment with his PCP today.   Plan I have reviewed and updated plans as below (the following is discussed with RX care.) 1. Decrease valproic acid 500 mg at night for two weeks, then 250 mg at night for two weeks, then discontinue  2. Decrease risperidone 2 mg at night (used to be on 2 mg BID) 3. Continue olanzapine 10 mgat night 4.Continue Trazodone 50 mg at night 5. Return to clinic inthree monthsfor 15 mins  The patient demonstrates the following risk factors for suicide: Chronic risk factors for suicide include:psychiatric disorder ofschizophrenia. Acute risk factorsfor suicide include: unemployment. Protective factorsfor this patient include: positive social support. Considering these factors, the overall suicide risk at  this point appears to below. Patientisappropriate for outpatient follow up.  The  duration of this appointment visit was 25 minutes of face-to-face time with the patient.  Greater than 50% of this time was spent in counseling, explanation of  diagnosis, planning of further management, and coordination of care.  [name], MD 06/19/2018, 9:35 AM

## 2018-06-19 ENCOUNTER — Other Ambulatory Visit: Payer: Self-pay

## 2018-06-19 ENCOUNTER — Encounter (HOSPITAL_COMMUNITY): Payer: Self-pay | Admitting: Psychiatry

## 2018-06-19 ENCOUNTER — Ambulatory Visit (INDEPENDENT_AMBULATORY_CARE_PROVIDER_SITE_OTHER): Payer: Medicare Other | Admitting: Psychiatry

## 2018-06-19 VITALS — BP 149/82 | HR 86 | Ht 60.0 in | Wt 189.0 lb

## 2018-06-19 DIAGNOSIS — Z79899 Other long term (current) drug therapy: Secondary | ICD-10-CM

## 2018-06-19 DIAGNOSIS — F203 Undifferentiated schizophrenia: Secondary | ICD-10-CM | POA: Diagnosis not present

## 2018-06-19 DIAGNOSIS — F209 Schizophrenia, unspecified: Secondary | ICD-10-CM | POA: Diagnosis not present

## 2018-06-19 DIAGNOSIS — F315 Bipolar disorder, current episode depressed, severe, with psychotic features: Secondary | ICD-10-CM | POA: Diagnosis not present

## 2018-06-19 DIAGNOSIS — Z1331 Encounter for screening for depression: Secondary | ICD-10-CM | POA: Diagnosis not present

## 2018-06-19 DIAGNOSIS — Z1389 Encounter for screening for other disorder: Secondary | ICD-10-CM | POA: Diagnosis not present

## 2018-06-19 DIAGNOSIS — E1165 Type 2 diabetes mellitus with hyperglycemia: Secondary | ICD-10-CM | POA: Diagnosis not present

## 2018-06-19 DIAGNOSIS — E871 Hypo-osmolality and hyponatremia: Secondary | ICD-10-CM | POA: Diagnosis not present

## 2018-06-19 MED ORDER — OLANZAPINE 10 MG PO TABS: 10.0000 mg | ORAL_TABLET | Freq: Every day | ORAL | 0 refills | Status: DC

## 2018-06-19 MED ORDER — TRAZODONE HCL 50 MG PO TABS: 50.0000 mg | ORAL_TABLET | Freq: Every day | ORAL | 0 refills | Status: DC

## 2018-06-19 MED ORDER — RISPERIDONE 2 MG PO TABS: 2.0000 mg | ORAL_TABLET | Freq: Every day | ORAL | 0 refills | Status: DC

## 2018-06-19 NOTE — Patient Instructions (Addendum)
1. Decrease valproic acid 500 mg at night for two weeks, then 250 mg at night for two weeks, then discontinue  2. Decrease risperidone 2 mg at night  3. Continue olanzapine 10 mgat night 4.Continue Trazodone 50 mg at night 5. Return to clinic inthree monthsfor 15 mins

## 2018-07-17 DIAGNOSIS — I10 Essential (primary) hypertension: Secondary | ICD-10-CM | POA: Diagnosis not present

## 2018-07-17 DIAGNOSIS — F315 Bipolar disorder, current episode depressed, severe, with psychotic features: Secondary | ICD-10-CM | POA: Diagnosis not present

## 2018-07-17 DIAGNOSIS — E119 Type 2 diabetes mellitus without complications: Secondary | ICD-10-CM | POA: Diagnosis not present

## 2018-08-16 ENCOUNTER — Ambulatory Visit (HOSPITAL_COMMUNITY): Payer: Medicare Other | Admitting: Psychiatry

## 2018-08-16 DIAGNOSIS — H401131 Primary open-angle glaucoma, bilateral, mild stage: Secondary | ICD-10-CM | POA: Diagnosis not present

## 2018-08-17 DIAGNOSIS — E1165 Type 2 diabetes mellitus with hyperglycemia: Secondary | ICD-10-CM | POA: Diagnosis not present

## 2018-08-17 DIAGNOSIS — I10 Essential (primary) hypertension: Secondary | ICD-10-CM | POA: Diagnosis not present

## 2018-09-04 DIAGNOSIS — I739 Peripheral vascular disease, unspecified: Secondary | ICD-10-CM | POA: Diagnosis not present

## 2018-09-04 DIAGNOSIS — B351 Tinea unguium: Secondary | ICD-10-CM | POA: Diagnosis not present

## 2018-09-04 DIAGNOSIS — E1142 Type 2 diabetes mellitus with diabetic polyneuropathy: Secondary | ICD-10-CM | POA: Diagnosis not present

## 2018-09-06 DIAGNOSIS — I10 Essential (primary) hypertension: Secondary | ICD-10-CM | POA: Diagnosis not present

## 2018-09-06 DIAGNOSIS — E1165 Type 2 diabetes mellitus with hyperglycemia: Secondary | ICD-10-CM | POA: Diagnosis not present

## 2018-09-06 DIAGNOSIS — F315 Bipolar disorder, current episode depressed, severe, with psychotic features: Secondary | ICD-10-CM | POA: Diagnosis not present

## 2018-09-13 NOTE — Progress Notes (Signed)
Virtual Visit via Telephone Note  I connected with Maria Pace on 09/20/18 at  9:00 AM EDT by telephone and verified that I am speaking with the correct person using two identifiers.   I discussed the limitations, risks, security and privacy concerns of performing an evaluation and management service by telephone and the availability of in person appointments. I also discussed with the patient that there may be a patient responsible charge related to this service. The patient expressed understanding and agreed to proceed.     I discussed the assessment and treatment plan with the patient. The patient was provided an opportunity to ask questions and all were answered. The patient agreed with the plan and demonstrated an understanding of the instructions.   The patient was advised to call back or seek an in-person evaluation if the symptoms worsen or if the condition fails to improve as anticipated.  I provided 15 minutes of non-face-to-face time during this encounter.   Neysa Hottereina Damion Kant, MD    [facility]  09/20/2018 9:25 AM Maria Poporothy A Malak  MRN:  [id number]  Chief Complaint:  Chief Complaint    Anxiety; Follow-up; Schizophrenia     HPI:  This is a follow-up appointment for schizophrenia and neurocognitive disorder.  She states that she has been doing fine. She enjoys reading "christian book." When she is asked if she feels safe at home, she states that "If you hear, you can hear christian."  On further questioning, She Denies Any Safety Concern at the facility. She states that she has left hand tremors. She sleeps well. She denies feeling depressed or anxious. She denies SI, HI, AH, VH. She denies paranoia.   The staff at the facility is at the interview.  Maria Pace tends to watch TV or goes outside at times.  She has been more active in a positive way.  She eats well. She tends to sleep during the day and stay up at night. Although the staff "do everything we can," she  continues this sleep cycle.  No aggressive behavior or safety concern. She was seen by Dr. Felecia ShellingFanta. The staff notices that she has had worsening in left hand tremors.   Visit Diagnosis:    ICD-10-CM   1. Schizophrenia, unspecified type (HCC)  F20.9   2. Neurocognitive disorder  R41.9     Past Psychiatric History: Please see initial evaluation for full details. I have reviewed the history. No updates at this time.     Past Medical History:  Past Medical History:  Diagnosis Date  . Bipolar 1 disorder (HCC)   . Essential hypertension   . GERD (gastroesophageal reflux disease)   . Hyperlipidemia   . Irregular heart beat   . Schizophrenia (HCC)   . Type 2 diabetes mellitus (HCC)     Past Surgical History:  Procedure Laterality Date  . YAG LASER APPLICATION Left 01/06/2015   Procedure: YAG LASER APPLICATION;  Surgeon: Jethro BolusMark Shapiro, MD;  Location: [facility];  Service: Ophthalmology;  Laterality: Left;    Family Psychiatric History: Please see initial evaluation for full details. I have reviewed the history. No updates at this time.     Family History: History reviewed. No pertinent family history.  Social History:  Social History   Socioeconomic History  . Marital status: Single    Spouse name: Not on file  . Number of children: Not on file  . Years of education: Not on file  . Highest education level: Not on file  Occupational History  . Not on file  Social Needs  . Financial resource strain: Not on file  . Food insecurity    Worry: Not on file    Inability: Not on file  . Transportation needs    Medical: Not on file    Non-medical: Not on file  Tobacco Use  . Smoking status: Never Smoker  . Smokeless tobacco: Never Used  Substance and Sexual Activity  . Alcohol use: No  . Drug use: No  . Sexual activity: Never  Lifestyle  . Physical activity    Days per week: Not on file    Minutes per session: Not on file  . Stress: Not on file  Relationships  . Social  Musicianconnections    Talks on phone: Not on file    Gets together: Not on file    Attends religious service: Not on file    Active member of club or organization: Not on file    Attends meetings of clubs or organizations: Not on file    Relationship status: Not on file  Other Topics Concern  . Not on file  Social History Narrative  . Not on file    Allergies:  Allergies  Allergen Reactions  . Haloperidol Lactate Other (See Comments)    Couldn't urinate anymore    Metabolic Disorder Labs: Lab Results  Component Value Date   HGBA1C 11.0 (H) 10/28/2013   No results found for: PROLACTIN Lab Results  Component Value Date   CHOL 204 (H) 10/30/2013   TRIG 240 (H) 10/30/2013   HDL 38 (L) 10/30/2013   VLDL 48 (H) 10/30/2013   LDLCALC 118 (H) 10/30/2013   Lab Results  Component Value Date   TSH 2.634 05/18/2017   TSH 2.084 11/10/2016    Therapeutic Level Labs: No results found for: LITHIUM Lab Results  Component Value Date   VALPROATE 64 06/13/2018   VALPROATE 86.5 02/15/2018   No components found for:  CBMZ  Current Medications: Current Outpatient Medications  Medication Sig Dispense Refill  . acetaminophen (TYLENOL) 500 MG tablet Take 500 mg by mouth every 8 (eight) hours as needed for mild pain or moderate pain.    Marland Kitchen. albuterol (PROAIR HFA) 108 (90 Base) MCG/ACT inhaler Inhale 2 puffs into the lungs every 6 (six) hours as needed for wheezing or shortness of breath.    Marland Kitchen. atorvastatin (LIPITOR) 10 MG tablet Take 10 mg by mouth daily.    . bisacodyl (DULCOLAX) 10 MG suppository Place 1 suppository (10 mg total) rectally as needed for moderate constipation. 12 suppository 0  . Camphor-Eucalyptus-Menthol (VICKS VAPORUB) 4.7-1.2-2.6 % OINT Apply 1 application topically daily. Apply once to toenails daily    . diclofenac sodium (VOLTAREN) 1 % GEL Apply 2 g topically 2 (two) times daily as needed.    . docusate sodium (COLACE) 100 MG capsule Take 200 mg by mouth daily.    .  hydrochlorothiazide (MICROZIDE) 12.5 MG capsule Take 12.5 mg by mouth daily.    Marland Kitchen. latanoprost (XALATAN) 0.005 % ophthalmic solution Place 1 drop into both eyes at bedtime.    Marland Kitchen. linagliptin (TRADJENTA) 5 MG TABS tablet Take 5 mg by mouth daily.    Marland Kitchen. lisinopril (PRINIVIL,ZESTRIL) 20 MG tablet Take 20 mg by mouth daily.    Marland Kitchen. loratadine (CLARITIN) 10 MG tablet Take 10 mg by mouth daily.    . metFORMIN (GLUCOPHAGE) 500 MG tablet Take 500 mg by mouth 2 (two) times daily with a meal.    .  metoprolol tartrate (LOPRESSOR) 50 MG tablet Take 50 mg by mouth 2 (two) times daily.    Marland Kitchen OLANZapine (ZYPREXA) 10 MG tablet Take 1 tablet (10 mg total) by mouth at bedtime. 90 tablet 0  . pantoprazole (PROTONIX) 40 MG tablet Take 40 mg by mouth daily.    . polyethylene glycol powder (GLYCOLAX/MIRALAX) powder Take 17 g by mouth daily as needed for mild constipation or moderate constipation.    . risperiDONE (RISPERDAL) 1 MG tablet Take 1 tablet (1 mg total) by mouth at bedtime. 90 tablet 0  . senna-docusate (SENOKOT-S) 8.6-50 MG tablet Take 2 tablets by mouth at bedtime. 60 tablet 0  . traZODone (DESYREL) 50 MG tablet Take 1 tablet (50 mg total) by mouth at bedtime. 90 tablet 0   No current facility-administered medications for this visit.      Musculoskeletal: Strength & Muscle Tone: N/A Gait & Station: N/A Patient leans: N/A  Psychiatric Specialty Exam: Review of Systems  Neurological: Positive for tremors.  Psychiatric/Behavioral: Positive for memory loss. Negative for depression, hallucinations, substance abuse and suicidal ideas. The patient has insomnia. The patient is not nervous/anxious.   All other systems reviewed and are negative.   There were no vitals taken for this visit.There is no height or weight on file to calculate BMI.  General Appearance: NA  Eye Contact:  NA  Speech:  Clear and Coherent  Volume:  Normal  Mood:  "good"  Affect:  NA  Thought Process:  Coherent, disorganized at times  (unclear whether it is related to her cognition or hearing impairment)  Orientation:  Full (Time, Place, and Person) "24th"  Thought Content: Logical   Suicidal Thoughts:  No  Homicidal Thoughts:  No  Memory:  Immediate;   Good  Judgement:  Fair  Insight:  Lacking  Psychomotor Activity:  Normal  Concentration:  Concentration: Good and Attention Span: Good  Recall:  Good  Fund of Knowledge: Good  Language: Good  Akathisia:  No  Handed:  Right  AIMS (if indicated): not done  Assets:  Communication Skills Desire for Improvement  ADL's:  Intact  Cognition: WNL  Sleep:  Fair   Screenings:   Assessment and Plan:  AARICA WAX is a 78 y.o. year old female with a history of schizophrenia, , hypertension, type II diabetes , who presents for follow up appointment for schizophrenia.   # Schizophrenia She continues to demonstrate linear thought process except occasional disorganization.  The staff denies significant behavior issues since the last visit despite tapering of Depakote.  Will slowly taper down Risperdal to avoid polypharmacy, orthostatic hypotension, and to see if it alleviates reported left hand tremor.  Will continue olanzapine to target schizophrenia.  Discussed metabolic side effect.  Will continue trazodone for insomnia.   # Cognitive deficits She does have cognitive deficits as characterized by mini cog.  Will continue to monitor.   # Hyponatremia She was reportedly checked by Dr. Legrand Rams, her PCP. Will obtain record.   Plan I have reviewed and updated plans as below 1. Decrease risperidone 1 mg at night (used to be on 2 mg BID) 2. Continue olanzapine 10 mgat night 3.Continue Trazodone 50 mg at night 4. Obtain record from Dr. Legrand Rams 5. Send instruction note to Fax: [phone]  The patient demonstrates the following risk factors for suicide: Chronic risk factors for suicide include:psychiatric disorder ofschizophrenia. Acute risk factorsfor suicide  include: unemployment. Protective factorsfor this patient include: positive social support. Considering these factors, the overall suicide  risk at this point appears to below. Patientisappropriate for outpatient follow up.  Neysa Hottereina Makoto Sellitto, MD 09/20/2018, 9:25 AM

## 2018-09-18 ENCOUNTER — Ambulatory Visit (HOSPITAL_COMMUNITY): Payer: Medicare Other | Admitting: Psychiatry

## 2018-09-20 ENCOUNTER — Ambulatory Visit (INDEPENDENT_AMBULATORY_CARE_PROVIDER_SITE_OTHER): Payer: Medicare Other | Admitting: Psychiatry

## 2018-09-20 ENCOUNTER — Other Ambulatory Visit: Payer: Self-pay

## 2018-09-20 ENCOUNTER — Encounter (HOSPITAL_COMMUNITY): Payer: Self-pay | Admitting: Psychiatry

## 2018-09-20 DIAGNOSIS — F209 Schizophrenia, unspecified: Secondary | ICD-10-CM

## 2018-09-20 DIAGNOSIS — R419 Unspecified symptoms and signs involving cognitive functions and awareness: Secondary | ICD-10-CM

## 2018-09-20 MED ORDER — RISPERIDONE 1 MG PO TABS: 1.0000 mg | ORAL_TABLET | Freq: Every day | ORAL | 0 refills | Status: DC

## 2018-09-20 MED ORDER — TRAZODONE HCL 50 MG PO TABS: 50.0000 mg | ORAL_TABLET | Freq: Every day | ORAL | 0 refills | Status: DC

## 2018-09-20 MED ORDER — OLANZAPINE 10 MG PO TABS: 10.0000 mg | ORAL_TABLET | Freq: Every day | ORAL | 0 refills | Status: DC

## 2018-09-20 MED ORDER — RISPERIDONE 2 MG PO TABS: 2.0000 mg | ORAL_TABLET | Freq: Every day | ORAL | 0 refills | Status: DC

## 2018-09-20 NOTE — Patient Instructions (Signed)
1. Decrease risperidone 1 mg at night 2. Continue olanzapine 10 mgat night 3.Continue Trazodone 50 mg at night 4. Obtain record from Dr. Legrand Rams 5. Send instruction note to Fax: [phone]

## 2018-10-06 DIAGNOSIS — E119 Type 2 diabetes mellitus without complications: Secondary | ICD-10-CM | POA: Diagnosis not present

## 2018-10-06 DIAGNOSIS — I10 Essential (primary) hypertension: Secondary | ICD-10-CM | POA: Diagnosis not present

## 2018-11-06 DIAGNOSIS — E119 Type 2 diabetes mellitus without complications: Secondary | ICD-10-CM | POA: Diagnosis not present

## 2018-11-06 DIAGNOSIS — I10 Essential (primary) hypertension: Secondary | ICD-10-CM | POA: Diagnosis not present

## 2018-11-13 DIAGNOSIS — I739 Peripheral vascular disease, unspecified: Secondary | ICD-10-CM | POA: Diagnosis not present

## 2018-11-13 DIAGNOSIS — E1142 Type 2 diabetes mellitus with diabetic polyneuropathy: Secondary | ICD-10-CM | POA: Diagnosis not present

## 2018-11-13 DIAGNOSIS — B351 Tinea unguium: Secondary | ICD-10-CM | POA: Diagnosis not present

## 2018-12-06 NOTE — Progress Notes (Deleted)
[place] MD/PA/NP OP Progress Note  12/06/2018 12:58 PM Maria Pace  MRN:  [id number]  Chief Complaint:  HPI: *** Visit Diagnosis: No diagnosis found.  Past Psychiatric History: Please see initial evaluation for full details. I have reviewed the history. No updates at this time.     Past Medical History:  Past Medical History:  Diagnosis Date  . Bipolar 1 disorder ([place])   . Essential hypertension   . GERD (gastroesophageal reflux disease)   . Hyperlipidemia   . Irregular heart beat   . Schizophrenia ([place])   . Type 2 diabetes mellitus ([place])     Past Surgical History:  Procedure Laterality Date  . YAG LASER APPLICATION Left 61/95/0932   Procedure: YAG LASER APPLICATION;  Surgeon: Rutherford Guys, MD;  Location: [facility];  Service: Ophthalmology;  Laterality: Left;    Family Psychiatric History: Please see initial evaluation for full details. I have reviewed the history. No updates at this time.     Family History: No family history on file.  Social History:  Social History   Socioeconomic History  . Marital status: Single    Spouse name: Not on file  . Number of children: Not on file  . Years of education: Not on file  . Highest education level: Not on file  Occupational History  . Not on file  Social Needs  . Financial resource strain: Not on file  . Food insecurity    Worry: Not on file    Inability: Not on file  . Transportation needs    Medical: Not on file    Non-medical: Not on file  Tobacco Use  . Smoking status: Never Smoker  . Smokeless tobacco: Never Used  Substance and Sexual Activity  . Alcohol use: No  . Drug use: No  . Sexual activity: Never  Lifestyle  . Physical activity    Days per week: Not on file    Minutes per session: Not on file  . Stress: Not on file  Relationships  . Social Herbalist on phone: Not on file    Gets together: Not on file    Attends religious service: Not on file    Active member of club or organization: Not  on file    Attends meetings of clubs or organizations: Not on file    Relationship status: Not on file  Other Topics Concern  . Not on file  Social History Narrative  . Not on file    Allergies:  Allergies  Allergen Reactions  . Haloperidol Lactate Other (See Comments)    Couldn't urinate anymore    Metabolic Disorder Labs: Lab Results  Component Value Date   HGBA1C 11.0 (H) 10/28/2013   No results found for: PROLACTIN Lab Results  Component Value Date   CHOL 204 (H) 10/30/2013   TRIG 240 (H) 10/30/2013   HDL 38 (L) 10/30/2013   VLDL 48 (H) 10/30/2013   LDLCALC 118 (H) 10/30/2013   Lab Results  Component Value Date   TSH 2.634 05/18/2017   TSH 2.084 11/10/2016    Therapeutic Level Labs: No results found for: LITHIUM Lab Results  Component Value Date   VALPROATE 64 06/13/2018   VALPROATE 86.5 02/15/2018   No components found for:  CBMZ  Current Medications: Current Outpatient Medications  Medication Sig Dispense Refill  . acetaminophen (TYLENOL) 500 MG tablet Take 500 mg by mouth every 8 (eight) hours as needed for mild pain or moderate pain.    Marland Kitchen  albuterol (PROAIR HFA) 108 (90 Base) MCG/ACT inhaler Inhale 2 puffs into the lungs every 6 (six) hours as needed for wheezing or shortness of breath.    Marland Kitchen. atorvastatin (LIPITOR) 10 MG tablet Take 10 mg by mouth daily.    . bisacodyl (DULCOLAX) 10 MG suppository Place 1 suppository (10 mg total) rectally as needed for moderate constipation. 12 suppository 0  . Camphor-Eucalyptus-Menthol (VICKS VAPORUB) 4.7-1.2-2.6 % OINT Apply 1 application topically daily. Apply once to toenails daily    . diclofenac sodium (VOLTAREN) 1 % GEL Apply 2 g topically 2 (two) times daily as needed.    . docusate sodium (COLACE) 100 MG capsule Take 200 mg by mouth daily.    . hydrochlorothiazide (MICROZIDE) 12.5 MG capsule Take 12.5 mg by mouth daily.    Marland Kitchen. latanoprost (XALATAN) 0.005 % ophthalmic solution Place 1 drop into both eyes at  bedtime.    Marland Kitchen. linagliptin (TRADJENTA) 5 MG TABS tablet Take 5 mg by mouth daily.    Marland Kitchen. lisinopril (PRINIVIL,ZESTRIL) 20 MG tablet Take 20 mg by mouth daily.    Marland Kitchen. loratadine (CLARITIN) 10 MG tablet Take 10 mg by mouth daily.    . metFORMIN (GLUCOPHAGE) 500 MG tablet Take 500 mg by mouth 2 (two) times daily with a meal.    . metoprolol tartrate (LOPRESSOR) 50 MG tablet Take 50 mg by mouth 2 (two) times daily.    Marland Kitchen. OLANZapine (ZYPREXA) 10 MG tablet Take 1 tablet (10 mg total) by mouth at bedtime. 90 tablet 0  . pantoprazole (PROTONIX) 40 MG tablet Take 40 mg by mouth daily.    . polyethylene glycol powder (GLYCOLAX/MIRALAX) powder Take 17 g by mouth daily as needed for mild constipation or moderate constipation.    . risperiDONE (RISPERDAL) 1 MG tablet Take 1 tablet (1 mg total) by mouth at bedtime. 90 tablet 0  . senna-docusate (SENOKOT-S) 8.6-50 MG tablet Take 2 tablets by mouth at bedtime. 60 tablet 0  . traZODone (DESYREL) 50 MG tablet Take 1 tablet (50 mg total) by mouth at bedtime. 90 tablet 0   No current facility-administered medications for this visit.      Musculoskeletal: Strength & Muscle Tone: N/A Gait & Station: N/A Patient leans: N/A  Psychiatric Specialty Exam: ROS  There were no vitals taken for this visit.There is no height or weight on file to calculate BMI.  General Appearance: {Appearance:22683}  Eye Contact:  {BHH EYE CONTACT:22684}  Speech:  Clear and Coherent  Volume:  Normal  Mood:  {BHH MOOD:22306}  Affect:  {Affect (PAA):22687}  Thought Process:  Coherent  Orientation:  Full (Time, Place, and Person)  Thought Content: Logical   Suicidal Thoughts:  {ST/HT (PAA):22692}  Homicidal Thoughts:  {ST/HT (PAA):22692}  Memory:  Immediate;   Good  Judgement:  {Judgement (PAA):22694}  Insight:  {Insight (PAA):22695}  Psychomotor Activity:  Normal  Concentration:  Concentration: Good and Attention Span: Good  Recall:  Good  Fund of Knowledge: Good  Language: Good   Akathisia:  No  Handed:  Right  AIMS (if indicated): not done  Assets:  Communication Skills Desire for Improvement  ADL's:  Intact  Cognition: WNL  Sleep:  {BHH GOOD/FAIR/POOR:22877}   Screenings:   Assessment and Plan:  Maria Pace is a 78 y.o. year old female with a history of schizophrenia, hypertension, type II diabetes , who presents for follow up appointment for No diagnosis found.  # Schizophrenia  She continues to demonstrate linear thought process except occasional disorganization.  The staff denies significant behavior issues since the last visit despite tapering of Depakote.  Will slowly taper down Risperdal to avoid polypharmacy, orthostatic hypotension, and to see if it alleviates reported left hand tremor.  Will continue olanzapine to target schizophrenia.  Discussed metabolic side effect.  Will continue trazodone for insomnia.   # Cognitive deficits She does have cognitive deficits as characterized by mini cog.  Will continue to monitor.   #Hyponatremia She was reportedly checked by Dr. Felecia Shelling, her PCP. Will obtain record.   Plan  1. Decrease risperidone 1 mg at night(used to be on 2 mg BID) 2.Continue olanzapine 10 mgat night 3.Continue Trazodone 50 mg at night 4. Obtain record from Dr. Felecia Shelling 5. Send instruction note to Fax: [phone]  The patient demonstrates the following risk factors for suicide: Chronic risk factors for suicide include:psychiatric disorder ofschizophrenia. Acute risk factorsfor suicide include: unemployment. Protective factorsfor this patient include: positive social support. Considering these factors, the overall suicide risk at this point appears to below. Patientisappropriate for outpatient follow up.  [name], MD 12/06/2018, 12:58 PM

## 2018-12-07 DIAGNOSIS — I10 Essential (primary) hypertension: Secondary | ICD-10-CM | POA: Diagnosis not present

## 2018-12-07 DIAGNOSIS — E1165 Type 2 diabetes mellitus with hyperglycemia: Secondary | ICD-10-CM | POA: Diagnosis not present

## 2018-12-11 ENCOUNTER — Telehealth (HOSPITAL_COMMUNITY): Payer: Self-pay | Admitting: Psychiatry

## 2018-12-11 ENCOUNTER — Ambulatory Visit (HOSPITAL_COMMUNITY): Payer: Medicare Other | Admitting: Psychiatry

## 2018-12-11 ENCOUNTER — Other Ambulatory Visit: Payer: Self-pay

## 2018-12-11 NOTE — Telephone Encounter (Signed)
Called the patient  twice for appointment scheduled today. The patient/facility did not answer the phone. Left voice message to contact the office.

## 2018-12-19 NOTE — Progress Notes (Signed)
Virtual Visit via Telephone Note  I connected with Maria Pace on 12/24/18 at  2:30 PM EDT by telephone and verified that I am speaking with the correct person using two identifiers.   I discussed the limitations, risks, security and privacy concerns of performing an evaluation and management service by telephone and the availability of in person appointments. I also discussed with the patient that there may be a patient responsible charge related to this service. The patient expressed understanding and agreed to proceed.     I discussed the assessment and treatment plan with the patient. The patient was provided an opportunity to ask questions and all were answered. The patient agreed with the plan and demonstrated an understanding of the instructions.   The patient was advised to call back or seek an in-person evaluation if the symptoms worsen or if the condition fails to improve as anticipated.  I provided 15 minutes of non-face-to-face time during this encounter.   Norman Clay, MD    [facility] MD/PA/NP OP Progress Note  12/24/2018 3:08 PM Maria Pace  MRN:  [id number]  Chief Complaint:  Chief Complaint    Follow-up; Schizophrenia     HPI:  This is a follow-up appointment for schizophrenia and neurocognitive disorder.  Patient states that she has been doing okay.  She wrote a song today. She denies any concern. She sleeps well.  She has good appetite.  She denies anxiety.  She denies SI, HI, AH, VH.  She denies paranoia.  She denies ideas of reference.   The staff at the facility is at the interview.  She has been doing very well. She watches TV or goes outside. She has been active. There has been no concern since the last visit. No behavioral concerns. She sleeps well at night. No episode of dizziness. She eats well. She takes medication regularly.    Visit Diagnosis:    ICD-10-CM   1. Schizophrenia, unspecified type ([place])  F20.9   2. Neurocognitive disorder  R41.9      Past Psychiatric History: Please see initial evaluation for full details. I have reviewed the history. No updates at this time.     Past Medical History:  Past Medical History:  Diagnosis Date  . Bipolar 1 disorder ([place])   . Essential hypertension   . GERD (gastroesophageal reflux disease)   . Hyperlipidemia   . Irregular heart beat   . Schizophrenia ([place])   . Type 2 diabetes mellitus ([place])     Past Surgical History:  Procedure Laterality Date  . YAG LASER APPLICATION Left 97/04/6376   Procedure: YAG LASER APPLICATION;  Surgeon: Rutherford Guys, MD;  Location: [facility];  Service: Ophthalmology;  Laterality: Left;    Family Psychiatric History: Please see initial evaluation for full details. I have reviewed the history. No updates at this time.     Family History: History reviewed. No pertinent family history.  Social History:  Social History   Socioeconomic History  . Marital status: Single    Spouse name: Not on file  . Number of children: Not on file  . Years of education: Not on file  . Highest education level: Not on file  Occupational History  . Not on file  Social Needs  . Financial resource strain: Not on file  . Food insecurity    Worry: Not on file    Inability: Not on file  . Transportation needs    Medical: Not on file    Non-medical: Not on  file  Tobacco Use  . Smoking status: Never Smoker  . Smokeless tobacco: Never Used  Substance and Sexual Activity  . Alcohol use: No  . Drug use: No  . Sexual activity: Never  Lifestyle  . Physical activity    Days per week: Not on file    Minutes per session: Not on file  . Stress: Not on file  Relationships  . Social Musician on phone: Not on file    Gets together: Not on file    Attends religious service: Not on file    Active member of club or organization: Not on file    Attends meetings of clubs or organizations: Not on file    Relationship status: Not on file  Other Topics Concern   . Not on file  Social History Narrative  . Not on file    Allergies:  Allergies  Allergen Reactions  . Haloperidol Lactate Other (See Comments)    Couldn't urinate anymore    Metabolic Disorder Labs: Lab Results  Component Value Date   HGBA1C 11.0 (H) 10/28/2013   No results found for: PROLACTIN Lab Results  Component Value Date   CHOL 204 (H) 10/30/2013   TRIG 240 (H) 10/30/2013   HDL 38 (L) 10/30/2013   VLDL 48 (H) 10/30/2013   LDLCALC 118 (H) 10/30/2013   Lab Results  Component Value Date   TSH 2.634 05/18/2017   TSH 2.084 11/10/2016    Therapeutic Level Labs: No results found for: LITHIUM Lab Results  Component Value Date   VALPROATE 64 06/13/2018   VALPROATE 86.5 02/15/2018   No components found for:  CBMZ  Current Medications: Current Outpatient Medications  Medication Sig Dispense Refill  . acetaminophen (TYLENOL) 500 MG tablet Take 500 mg by mouth every 8 (eight) hours as needed for mild pain or moderate pain.    Marland Kitchen albuterol (PROAIR HFA) 108 (90 Base) MCG/ACT inhaler Inhale 2 puffs into the lungs every 6 (six) hours as needed for wheezing or shortness of breath.    Marland Kitchen atorvastatin (LIPITOR) 10 MG tablet Take 10 mg by mouth daily.    . bisacodyl (DULCOLAX) 10 MG suppository Place 1 suppository (10 mg total) rectally as needed for moderate constipation. 12 suppository 0  . Camphor-Eucalyptus-Menthol (VICKS VAPORUB) 4.7-1.2-2.6 % OINT Apply 1 application topically daily. Apply once to toenails daily    . diclofenac sodium (VOLTAREN) 1 % GEL Apply 2 g topically 2 (two) times daily as needed.    . docusate sodium (COLACE) 100 MG capsule Take 200 mg by mouth daily.    . hydrochlorothiazide (MICROZIDE) 12.5 MG capsule Take 12.5 mg by mouth daily.    Marland Kitchen latanoprost (XALATAN) 0.005 % ophthalmic solution Place 1 drop into both eyes at bedtime.    Marland Kitchen linagliptin (TRADJENTA) 5 MG TABS tablet Take 5 mg by mouth daily.    Marland Kitchen lisinopril (PRINIVIL,ZESTRIL) 20 MG tablet  Take 20 mg by mouth daily.    Marland Kitchen loratadine (CLARITIN) 10 MG tablet Take 10 mg by mouth daily.    . metFORMIN (GLUCOPHAGE) 500 MG tablet Take 500 mg by mouth 2 (two) times daily with a meal.    . metoprolol tartrate (LOPRESSOR) 50 MG tablet Take 50 mg by mouth 2 (two) times daily.    Marland Kitchen OLANZapine (ZYPREXA) 10 MG tablet Take 1 tablet (10 mg total) by mouth at bedtime. 90 tablet 1  . pantoprazole (PROTONIX) 40 MG tablet Take 40 mg by mouth daily.    Marland Kitchen  polyethylene glycol powder (GLYCOLAX/MIRALAX) powder Take 17 g by mouth daily as needed for mild constipation or moderate constipation.    . senna-docusate (SENOKOT-S) 8.6-50 MG tablet Take 2 tablets by mouth at bedtime. 60 tablet 0  . traZODone (DESYREL) 50 MG tablet Take 1 tablet (50 mg total) by mouth at bedtime. 90 tablet 1   No current facility-administered medications for this visit.      Musculoskeletal: Strength & Muscle Tone: N/A Gait & Station: N/A Patient leans: N/A  Psychiatric Specialty Exam: Review of Systems  Psychiatric/Behavioral: Positive for memory loss. Negative for depression, hallucinations, substance abuse and suicidal ideas. The patient is not nervous/anxious and does not have insomnia.   All other systems reviewed and are negative.   There were no vitals taken for this visit.There is no height or weight on file to calculate BMI.  General Appearance: NA  Eye Contact:  NA  Speech:  Clear and Coherent  Volume:  Normal  Mood:  "good"  Affect:  NA  Thought Process:  Coherent  Orientation:  Full (Time, Place, and Person)  Thought Content: Logical   Suicidal Thoughts:  No  Homicidal Thoughts:  No  Memory:  Immediate;   Good  Judgement:  Fair  Insight:  Present  Psychomotor Activity:  Normal  Concentration:  Concentration: Good and Attention Span: Good  Recall:  Good  Fund of Knowledge: Good  Language: Good  Akathisia:  No  Handed:  Right  AIMS (if indicated): not done  Assets:  Communication Skills Desire  for Improvement  ADL's:  Intact  Cognition: WNL  Sleep:  Good   Screenings:   Assessment and Plan:  Maria Pace is a 78 y.o. year old female with a history of schizophrenia,hypertension, type II diabetes , who presents for follow up appointment for Schizophrenia, unspecified type ([facility])  Neurocognitive disorder  # Schizophrenia She continues to demonstrate linear thought process on today's evaluation. No known significant behavioral issues since tapering down risperidone. Will discontinue this medication to avoid polypharmacy and orthostatic hypotension. Will continue to monitor her left hand tremor, which she had at the previous visit. Will continue olanzapine to target schizophrenia. Discussed metabolic side effect. Will continue trazodone for insomnia.   #Cognitive deficits She does have cognitive deficits as characterized by mini cog.  We will continue to monitor.   Plan I have reviewed and updated plans as below 1. Discontinue risperidone (it has been tapered off from 2 mg BID)- spoke with the pharmacist to remove this medication from pill pack 2.Continue olanzapine 10 mgat night 3.Continue Trazodone 50 mg at night 4. Send instruction note to Fax: [phone] 5. Next appointment: in 3 months   The patient demonstrates the following risk factors for suicide: Chronic risk factors for suicide include:psychiatric disorder ofschizophrenia. Acute risk factorsfor suicide include: unemployment. Protective factorsfor this patient include: positive social support. Considering these factors, the overall suicide risk at this point appears to below. Patientisappropriate for outpatient follow up.  [name], MD 12/24/2018, 3:08 PM

## 2018-12-24 ENCOUNTER — Other Ambulatory Visit: Payer: Self-pay

## 2018-12-24 ENCOUNTER — Encounter (HOSPITAL_COMMUNITY): Payer: Self-pay | Admitting: Psychiatry

## 2018-12-24 ENCOUNTER — Ambulatory Visit (INDEPENDENT_AMBULATORY_CARE_PROVIDER_SITE_OTHER): Payer: Medicare Other | Admitting: Psychiatry

## 2018-12-24 DIAGNOSIS — F209 Schizophrenia, unspecified: Secondary | ICD-10-CM

## 2018-12-24 DIAGNOSIS — R419 Unspecified symptoms and signs involving cognitive functions and awareness: Secondary | ICD-10-CM | POA: Diagnosis not present

## 2018-12-24 DIAGNOSIS — Z23 Encounter for immunization: Secondary | ICD-10-CM | POA: Diagnosis not present

## 2018-12-24 MED ORDER — OLANZAPINE 10 MG PO TABS: 10.0000 mg | ORAL_TABLET | Freq: Every day | ORAL | 1 refills | Status: DC

## 2018-12-24 MED ORDER — TRAZODONE HCL 50 MG PO TABS: 50.0000 mg | ORAL_TABLET | Freq: Every day | ORAL | 1 refills | Status: DC

## 2018-12-24 NOTE — Patient Instructions (Addendum)
1.Discontinue risperidone- discussed with the pharmacist to remove this medication from pill pack 2.Continue olanzapine 10 mgat night 3.Continue Trazodone 50 mg at night 4. Will send this instruction to Fax: [phone] 5. Next appointment: in 3 months

## 2019-01-06 DIAGNOSIS — I10 Essential (primary) hypertension: Secondary | ICD-10-CM | POA: Diagnosis not present

## 2019-01-06 DIAGNOSIS — E1165 Type 2 diabetes mellitus with hyperglycemia: Secondary | ICD-10-CM | POA: Diagnosis not present

## 2019-01-15 DIAGNOSIS — Z961 Presence of intraocular lens: Secondary | ICD-10-CM | POA: Diagnosis not present

## 2019-01-15 DIAGNOSIS — E119 Type 2 diabetes mellitus without complications: Secondary | ICD-10-CM | POA: Diagnosis not present

## 2019-01-15 DIAGNOSIS — H401131 Primary open-angle glaucoma, bilateral, mild stage: Secondary | ICD-10-CM | POA: Diagnosis not present

## 2019-01-15 DIAGNOSIS — H524 Presbyopia: Secondary | ICD-10-CM | POA: Diagnosis not present

## 2019-01-22 DIAGNOSIS — B351 Tinea unguium: Secondary | ICD-10-CM | POA: Diagnosis not present

## 2019-01-22 DIAGNOSIS — E1142 Type 2 diabetes mellitus with diabetic polyneuropathy: Secondary | ICD-10-CM | POA: Diagnosis not present

## 2019-02-20 ENCOUNTER — Other Ambulatory Visit: Payer: Self-pay

## 2019-03-07 ENCOUNTER — Other Ambulatory Visit: Payer: Self-pay

## 2019-03-07 ENCOUNTER — Ambulatory Visit (HOSPITAL_COMMUNITY)
Admission: RE | Admit: 2019-03-07 | Discharge: 2019-03-07 | Disposition: A | Discharge status: Home / Self Care | Payer: Medicare Other | Source: Ambulatory Visit | Attending: Internal Medicine | Admitting: Internal Medicine

## 2019-04-07 DIAGNOSIS — E1165 Type 2 diabetes mellitus with hyperglycemia: Secondary | ICD-10-CM | POA: Diagnosis not present

## 2019-04-07 DIAGNOSIS — F315 Bipolar disorder, current episode depressed, severe, with psychotic features: Secondary | ICD-10-CM | POA: Diagnosis not present

## 2019-04-07 DIAGNOSIS — I10 Essential (primary) hypertension: Secondary | ICD-10-CM | POA: Diagnosis not present

## 2019-04-08 ENCOUNTER — Encounter: Payer: Self-pay | Admitting: Cardiology

## 2019-04-08 NOTE — Progress Notes (Signed)
Virtual Visit via Telephone Note   This visit type was conducted due to national recommendations for restrictions regarding the COVID-19 Pandemic (e.g. social distancing) in an effort to limit this patient's exposure and mitigate transmission in our community.  Due to Maria co-morbid illnesses, this patient is at least at moderate risk for complications without adequate follow up.  This format is felt to be most appropriate for this patient at this time.  The patient did not have access to video technology/had technical difficulties with video requiring transitioning to audio format only (telephone).  All issues noted in this document were discussed and addressed.  No physical exam could be performed with this format.  Please refer to the patient's chart for Maria  consent to telehealth for [facility].  Date:  04/09/2019   ID:  Maria Pace, DOB December 17, 1940, MRN [id number]  Patient Location: Other:  Group home Provider Location: Office  PCP:  Avon Gully, MD  Consulting Cardiologist: Jonelle Sidle, MD Electrophysiologist:  None   Evaluation Performed:  New Patient Evaluation  Chief Complaint:  Abnormal ECG  History of Present Illness:    Maria Pace is a 79 y.o. female referred for cardiology consultation by Dr. Felecia Shelling for evaluation of abnormal ECG.  She is a resident of [facility].  We spoke by phone today, I also talked with an assistant named Doris.  Based on available records, it appears that Dr. Felecia Shelling was concerned about bradycardia resulting in the ECG obtained.  Maria Pace does not report any specific sense of dizziness, no syncope, occasionally feels like Maria heart rate elevates with excitement or sometimes with ambulation.  I personally reviewed Maria ECG from December 10 which shows possible atypical atrial flutter versus atrial tachycardia with variable conduction and grouped beating, IVCD with leftward axis and nonspecific ST changes.  Prior ECG from March 2020  showed sinus rhythm with IVCD and nonspecific T wave changes.  I reviewed Maria medications which are outlined below.  She does not report any recent changes.  Maria Pace tells me that she reads Maria Bible regularly, writes hymns, ambulates about the house but is relatively sedentary based on discussion.  The patient does not have symptoms concerning for COVID-19 infection (fever, chills, cough, or new shortness of breath).    Past Medical History:  Diagnosis Date  . Bipolar 1 disorder (HCC)   . Essential hypertension   . GERD (gastroesophageal reflux disease)   . Hyperlipidemia   . Irregular heart beat   . Schizophrenia (HCC)   . Type 2 diabetes mellitus (HCC)    Past Surgical History:  Procedure Laterality Date  . YAG LASER APPLICATION Left 01/06/2015   Procedure: YAG LASER APPLICATION;  Surgeon: Jethro Bolus, MD;  Location: [facility];  Service: Ophthalmology;  Laterality: Left;     Current Meds  Medication Sig  . acetaminophen (TYLENOL) 500 MG tablet Take 500 mg by mouth every 8 (eight) hours as needed for mild pain or moderate pain.  Marland Kitchen albuterol (PROAIR HFA) 108 (90 Base) MCG/ACT inhaler Inhale 2 puffs into the lungs every 6 (six) hours as needed for wheezing or shortness of breath.  Marland Kitchen atorvastatin (LIPITOR) 10 MG tablet Take 10 mg by mouth daily.  . bisacodyl (DULCOLAX) 10 MG suppository Place 1 suppository (10 mg total) rectally as needed for moderate constipation.  . Camphor-Eucalyptus-Menthol (VICKS VAPORUB) 4.7-1.2-2.6 % OINT Apply 1 application topically daily. Apply once to toenails daily  . diclofenac sodium (VOLTAREN) 1 % GEL  Apply 2 g topically 2 (two) times daily as needed.  . docusate sodium (COLACE) 100 MG capsule Take 200 mg by mouth daily.  . hydrochlorothiazide (MICROZIDE) 12.5 MG capsule Take 12.5 mg by mouth daily.  Marland Kitchen latanoprost (XALATAN) 0.005 % ophthalmic solution Place 1 drop into both eyes at bedtime.  Marland Kitchen linagliptin (TRADJENTA) 5 MG TABS tablet Take 5 mg by  mouth daily.  Marland Kitchen lisinopril (PRINIVIL,ZESTRIL) 20 MG tablet Take 20 mg by mouth daily.  Marland Kitchen loratadine (CLARITIN) 10 MG tablet Take 10 mg by mouth daily.  . metFORMIN (GLUCOPHAGE) 500 MG tablet Take 500 mg by mouth 2 (two) times daily with a meal.  . metoprolol tartrate (LOPRESSOR) 50 MG tablet Take 50 mg by mouth 2 (two) times daily.  Marland Kitchen OLANZapine (ZYPREXA) 10 MG tablet Take 1 tablet (10 mg total) by mouth at bedtime.  . pantoprazole (PROTONIX) 40 MG tablet Take 40 mg by mouth daily.  . polyethylene glycol powder (GLYCOLAX/MIRALAX) powder Take 17 g by mouth daily as needed for mild constipation or moderate constipation.  . senna-docusate (SENOKOT-S) 8.6-50 MG tablet Take 2 tablets by mouth at bedtime.  . traZODone (DESYREL) 50 MG tablet Take 1 tablet (50 mg total) by mouth at bedtime.     Allergies:   Haloperidol lactate   Social History   Tobacco Use  . Smoking status: Never Smoker  . Smokeless tobacco: Never Used  Substance Use Topics  . Alcohol use: No  . Drug use: No     Family Hx: The patient's family history includes Diabetes type II in Maria Pace; Hypertension in Maria Pace.  ROS:   Please see the history of present illness. All other systems reviewed and are negative.   Prior CV studies:   The following studies were reviewed today:  No prior cardiac testing for review.  Labs/Other Tests and Data Reviewed:    EKG:  An ECG dated 06/13/2018 was personally reviewed today and demonstrated:  Sinus rhythm with IVCD, leftward axis, nonspecific T wave changes.  Recent Labs: 06/13/2018: ALT 11; BUN 17; Creatinine, Ser 1.25; Hemoglobin 10.7; Platelets 144; Potassium 4.1; Sodium 126   Recent Lipid Panel Lab Results  Component Value Date/Time   CHOL 204 (H) 10/30/2013 05:01 AM   TRIG 240 (H) 10/30/2013 05:01 AM   HDL 38 (L) 10/30/2013 05:01 AM   LDLCALC 118 (H) 10/30/2013 05:01 AM    Wt Readings from Last 3 Encounters:  04/09/19 204 lb (92.5 kg)  06/13/18 192 lb (87.1  kg)  05/18/17 186 lb (84.4 kg)     Objective:    Vital Signs:  BP (!) 158/84   Pulse 84   Ht 5\' 4"  (1.626 m)   Wt 204 lb (92.5 kg)   BMI 35.02 kg/m    Patient spoke in full sentences, not obviously short of breath. No audible wheezing or coughing.  ASSESSMENT & PLAN:    1.  Abnormal ECG from December 2020 which shows what looks to be possible atypical atrial flutter versus atrial tachycardia with variable conduction and grouped beating.  Duration is uncertain.  Actually, Dr. Josephine Cables notes indicate a concern for bradycardia resulting in the tracing in question.  Patient does not report any progressive sense of palpitations, but does experience this intermittently.  No syncope reported.  Plan is to obtain a 72-hour Zio patch AT for further investigation.  She is already on beta-blocker. Further plans to follow.  2.  Essential hypertension, currently on HCTZ, lisinopril, and Lopressor.  3.  Mixed hyperlipidemia, on Lipitor.  4.  Type 2 diabetes mellitus, on Tradjenta and Glucophage.  5.  Schizophrenia.  COVID-19 Education: The signs and symptoms of COVID-19 were discussed with the patient and how to seek care for testing (follow up with PCP or arrange E-visit).  The importance of social distancing was discussed today.  Time:   Today, I have spent 10 minutes with the patient with telehealth technology discussing the above problems.     Medication Adjustments/Labs and Tests Ordered: Current medicines are reviewed at length with the patient today.  Concerns regarding medicines are outlined above.   Tests Ordered: Orders Placed This Encounter  Procedures  . Cardiac event monitor    Medication Changes: No orders of the defined types were placed in this encounter.   Follow Up:  Test results and determine next step.   Signed, Nona Dell, MD  04/09/2019 11:09 AM    [facility]

## 2019-04-09 ENCOUNTER — Ambulatory Visit (INDEPENDENT_AMBULATORY_CARE_PROVIDER_SITE_OTHER): Payer: Medicare Other | Admitting: Cardiology

## 2019-04-09 ENCOUNTER — Encounter: Payer: Self-pay | Admitting: Cardiology

## 2019-04-09 VITALS — BP 158/84 | HR 84 | Ht 64.0 in | Wt 204.0 lb

## 2019-04-09 DIAGNOSIS — R9431 Abnormal electrocardiogram [ECG] [EKG]: Secondary | ICD-10-CM

## 2019-04-09 DIAGNOSIS — I10 Essential (primary) hypertension: Secondary | ICD-10-CM | POA: Diagnosis not present

## 2019-04-09 NOTE — Patient Instructions (Signed)
Medication Instructions:  Your physician recommends that you continue on your current medications as directed. Please refer to the Current Medication list given to you today.  *If you need a refill on your cardiac medications before your next appointment, please call your pharmacy*  Lab Work: NONE  If you have labs (blood work) drawn today and your tests are completely normal, you will receive your results only by: Marland Kitchen MyChart Message (if you have MyChart) OR . A paper copy in the mail If you have any lab test that is abnormal or we need to change your treatment, we will call you to review the results.  Testing/Procedures: Your physician has recommended that you wear an event monitor for 3 days. Event monitors are medical devices that record the heart's electrical activity. Doctors most often [place] these monitors to diagnose arrhythmias. Arrhythmias are problems with the speed or rhythm of the heartbeat. The monitor is a small, portable device. You can wear one while you do your normal daily activities. This is usually used to diagnose what is causing palpitations/syncope (passing out).    Follow-Up: to be determined after Zio event Monitor      Thank you for choosing [facility] !

## 2019-04-11 ENCOUNTER — Ambulatory Visit (INDEPENDENT_AMBULATORY_CARE_PROVIDER_SITE_OTHER): Payer: Medicare Other

## 2019-04-11 DIAGNOSIS — R9431 Abnormal electrocardiogram [ECG] [EKG]: Secondary | ICD-10-CM

## 2019-04-12 DIAGNOSIS — R9431 Abnormal electrocardiogram [ECG] [EKG]: Secondary | ICD-10-CM | POA: Diagnosis not present

## 2019-04-25 ENCOUNTER — Ambulatory Visit: Payer: Medicare Other | Admitting: Psychiatry

## 2019-04-26 ENCOUNTER — Other Ambulatory Visit: Payer: Self-pay

## 2019-04-29 ENCOUNTER — Telehealth: Payer: Self-pay

## 2019-04-29 MED ORDER — METOPROLOL TARTRATE 50 MG PO TABS: 75.0000 mg | ORAL_TABLET | Freq: Two times a day (BID) | ORAL | 11 refills | Status: DC

## 2019-04-29 NOTE — Telephone Encounter (Signed)
-----   Message from Jonelle Sidle, MD sent at 04/29/2019 12:08 PM EST ----- Results reviewed.  Monitor shows episodes of atrial tachycardia as indicated.  We will likely manage this conservatively with heart rate control strategy as tolerated.  She is already on Lopressor, would increase from 50 mg twice daily to 75 mg twice daily.  Please note that she lives at [facility] - would communicate with staff there regarding medication adjustments as well as her PCP.

## 2019-04-29 NOTE — Telephone Encounter (Signed)
I spoke with caretaker [facility] and gave her results of monitor and new increase to Lopressor dose.e-scribed to [facility]

## 2019-05-08 DIAGNOSIS — F203 Undifferentiated schizophrenia: Secondary | ICD-10-CM | POA: Diagnosis not present

## 2019-05-08 DIAGNOSIS — I10 Essential (primary) hypertension: Secondary | ICD-10-CM | POA: Diagnosis not present

## 2019-05-10 DIAGNOSIS — Z23 Encounter for immunization: Secondary | ICD-10-CM | POA: Diagnosis not present

## 2019-05-15 NOTE — Progress Notes (Signed)
Virtual Visit via Telephone Note  I connected with Maria Pace on 05/16/19 at  2:30 PM EST by telephone and verified that I am speaking with the correct person using two identifiers.   I discussed the limitations, risks, security and privacy concerns of performing an evaluation and management service by telephone and the availability of in person appointments. I also discussed with the patient that there may be a patient responsible charge related to this service. The patient expressed understanding and agreed to proceed.      I discussed the assessment and treatment plan with the patient. The patient was provided an opportunity to ask questions and all were answered. The patient agreed with the plan and demonstrated an understanding of the instructions.   The patient was advised to call back or seek an in-person evaluation if the symptoms worsen or if the condition fails to improve as anticipated.  I provided 21 minutes of non-face-to-face time during this encounter.   [name], MD    [facility] MD/PA/NP OP Progress Note  05/16/2019 3:00 PM Maria Pace  MRN:  [id number]  Chief Complaint:  Chief Complaint    Follow-up; Other     HPI:  This is a follow-up appointment for schizophrenia.  She is relatively a poor historian, and does not elaborate the answer. She states that she has been doing fine. She enjoys writing a song. She denies paranoia. She denies hallucinations. She denies insomnia.  She reports good appetite.  She denies SI,  HI.  She feels safe at the group home.   The staff at [facility] presents during the interview.  This staff has been taking care of [name] for 5 years.  The staff notices that the patient has been talking out loud by herself all day.  The staff believes that it worsened after she is off Risperdal.  Her tremors have improved after she discontinued Risperdal.  She sleeps well.  Staff denies any aggressive behavior. No safety concern. She takes  medication regularly.    Activities of Daily Living (ADLs): updated as below. Maria Pace is independent in the following: bathing and hygiene, feeding, continence, grooming and toileting, walking Dependent: taking a shower  Visit Diagnosis:    ICD-10-CM   1. Schizophrenia, unspecified type (HCC)  F20.9   2. Neurocognitive disorder  R41.9     Past Psychiatric History: Please see initial evaluation for full details. I have reviewed the history. No updates at this time.     Past Medical History:  Past Medical History:  Diagnosis Date  . Bipolar 1 disorder (HCC)   . Essential hypertension   . GERD (gastroesophageal reflux disease)   . Hyperlipidemia   . Irregular heart beat   . Schizophrenia (HCC)   . Type 2 diabetes mellitus (HCC)     Past Surgical History:  Procedure Laterality Date  . YAG LASER APPLICATION Left 01/06/2015   Procedure: YAG LASER APPLICATION;  Surgeon: Jethro Bolus, MD;  Location: [facility];  Service: Ophthalmology;  Laterality: Left;    Family Psychiatric History: Please see initial evaluation for full details. I have reviewed the history. No updates at this time.     Family History:  Family History  Problem Relation Age of Onset  . Hypertension Father   . Diabetes type II Father     Social History:  Social History   Socioeconomic History  . Marital status: Single    Spouse name: Not on file  . Number of children:  Not on file  . Years of education: Not on file  . Highest education level: Not on file  Occupational History  . Not on file  Tobacco Use  . Smoking status: Never Smoker  . Smokeless tobacco: Never Used  Substance and Sexual Activity  . Alcohol use: No  . Drug use: No  . Sexual activity: Never  Other Topics Concern  . Not on file  Social History Narrative  . Not on file   Social Determinants of Health   Financial Resource Strain:   . Difficulty of Paying Living Expenses: Not on file  Food Insecurity:   . Worried About  Charity fundraiser in the Last Year: Not on file  . Ran Out of Food in the Last Year: Not on file  Transportation Needs:   . Lack of Transportation (Medical): Not on file  . Lack of Transportation (Non-Medical): Not on file  Physical Activity:   . Days of Exercise per Week: Not on file  . Minutes of Exercise per Session: Not on file  Stress:   . Feeling of Stress : Not on file  Social Connections:   . Frequency of Communication with Friends and Family: Not on file  . Frequency of Social Gatherings with Friends and Family: Not on file  . Attends Religious Services: Not on file  . Active Member of Clubs or Organizations: Not on file  . Attends Archivist Meetings: Not on file  . Marital Status: Not on file    Allergies:  Allergies  Allergen Reactions  . Haloperidol Lactate Other (See Comments)    Couldn't urinate anymore    Metabolic Disorder Labs: Lab Results  Component Value Date   HGBA1C 11.0 (H) 10/28/2013   No results found for: PROLACTIN Lab Results  Component Value Date   CHOL 204 (H) 10/30/2013   TRIG 240 (H) 10/30/2013   HDL 38 (L) 10/30/2013   VLDL 48 (H) 10/30/2013   LDLCALC 118 (H) 10/30/2013   Lab Results  Component Value Date   TSH 2.634 05/18/2017   TSH 2.084 11/10/2016    Therapeutic Level Labs: No results found for: LITHIUM Lab Results  Component Value Date   VALPROATE 64 06/13/2018   VALPROATE 86.5 02/15/2018   No components found for:  CBMZ  Current Medications: Current Outpatient Medications  Medication Sig Dispense Refill  . acetaminophen (TYLENOL) 500 MG tablet Take 500 mg by mouth every 8 (eight) hours as needed for mild pain or moderate pain.    Marland Kitchen albuterol (PROAIR HFA) 108 (90 Base) MCG/ACT inhaler Inhale 2 puffs into the lungs every 6 (six) hours as needed for wheezing or shortness of breath.    Marland Kitchen atorvastatin (LIPITOR) 10 MG tablet Take 10 mg by mouth daily.    . bisacodyl (DULCOLAX) 10 MG suppository Place 1 suppository  (10 mg total) rectally as needed for moderate constipation. 12 suppository 0  . Camphor-Eucalyptus-Menthol (VICKS VAPORUB) 4.7-1.2-2.6 % OINT Apply 1 application topically daily. Apply once to toenails daily    . diclofenac sodium (VOLTAREN) 1 % GEL Apply 2 g topically 2 (two) times daily as needed.    . docusate sodium (COLACE) 100 MG capsule Take 200 mg by mouth daily.    . hydrochlorothiazide (MICROZIDE) 12.5 MG capsule Take 12.5 mg by mouth daily.    Marland Kitchen latanoprost (XALATAN) 0.005 % ophthalmic solution Place 1 drop into both eyes at bedtime.    Marland Kitchen linagliptin (TRADJENTA) 5 MG TABS tablet Take 5 mg by  mouth daily.    Marland Kitchen lisinopril (PRINIVIL,ZESTRIL) 20 MG tablet Take 20 mg by mouth daily.    Marland Kitchen loratadine (CLARITIN) 10 MG tablet Take 10 mg by mouth daily.    . metFORMIN (GLUCOPHAGE) 500 MG tablet Take 500 mg by mouth 2 (two) times daily with a meal.    . metoprolol tartrate (LOPRESSOR) 50 MG tablet Take 1.5 tablets (75 mg total) by mouth 2 (two) times daily. 90 tablet 11  . [START ON 06/18/2019] OLANZapine (ZYPREXA) 10 MG tablet Take 1 tablet (10 mg total) by mouth at bedtime. Take total of 12.5 mg at night (take along with 2.5 mg tab) 90 tablet 0  . OLANZapine (ZYPREXA) 2.5 MG tablet Take total of 12.5 mg at night (take along with 10 mg tab) 90 tablet 0  . pantoprazole (PROTONIX) 40 MG tablet Take 40 mg by mouth daily.    . polyethylene glycol powder (GLYCOLAX/MIRALAX) powder Take 17 g by mouth daily as needed for mild constipation or moderate constipation.    . senna-docusate (SENOKOT-S) 8.6-50 MG tablet Take 2 tablets by mouth at bedtime. 60 tablet 0  . [START ON 06/20/2019] traZODone (DESYREL) 50 MG tablet Take 1 tablet (50 mg total) by mouth at bedtime. 90 tablet 0   No current facility-administered medications for this visit.     Musculoskeletal: Strength & Muscle Tone: N/A Gait & Station: N/A Patient leans: N/A  Psychiatric Specialty Exam: Review of Systems  Unable to perform ROS:  Dementia    There were no vitals taken for this visit.There is no height or weight on file to calculate BMI.  General Appearance: NA  Eye Contact:  NA  Speech:  Clear and Coherent  Volume:  Normal  Mood:  "good"  Affect:  NA  Thought Process:  Coherent  Orientation:  Full (Time, Place, and Person) "19th"  Thought Content: no delusion   Suicidal Thoughts:  No  Homicidal Thoughts:  No  Memory:  Immediate;   Good  Judgement:  Fair  Insight:  Lacking  Psychomotor Activity:  Normal  Concentration:  Concentration: Fair and Attention Span: Fair  Recall:  [employer] of Knowledge: Fair  Language: Fair  Akathisia:  No  Handed:  Right  AIMS (if indicated): not done  Assets:  Social Support  ADL's:  Impaired  Cognition: Impaired,  Mild  Sleep:  Good   Screenings:   Assessment and Plan:   SENNA LAPE is a 79 y.o. year old female with a history of schizophrenia, hypertension, type II diabetes, who presents for follow up appointment for Schizophrenia, unspecified type (HCC)  Neurocognitive disorder  # Schizophrenia There has been worsening in her behavior of talking to herself since tapering off risperidone according to the staff while there has been improvement in her tremors. Will uptitrate olanzapine for schizophrenia.  Discussed potential metabolic side effect and EPS.  We will continue trazodone for insomnia.  # Cognitive deficits She does have cognitive deficits as characterized by [place].  We will continue to monitor.   Plan 1. Increase olanzapine 12.5 mg at night - The staff is advised to check labs for lipid, glucose at her next PCP visit.  2. Continue Trazodone 50 mg at night 3. Send instruction note to [name], Fax: [phone] 4. Next appointment: 5/13 at 1 PM for 20 mins, phone  The patient demonstrates the following risk factors for suicide: Chronic risk factors for suicide include:psychiatric disorder ofschizophrenia. Acute risk factorsfor suicide  include: unemployment. Protective factorsfor  this patient include: positive social support. Considering these factors, the overall suicide risk at this point appears to below. Patientisappropriate for outpatient follow up.   [name], MD 05/16/2019, 3:00 PM

## 2019-05-16 ENCOUNTER — Other Ambulatory Visit: Payer: Self-pay

## 2019-05-16 ENCOUNTER — Ambulatory Visit (INDEPENDENT_AMBULATORY_CARE_PROVIDER_SITE_OTHER): Payer: Medicare Other | Admitting: Psychiatry

## 2019-05-16 ENCOUNTER — Ambulatory Visit: Payer: Medicare Other | Admitting: Psychiatry

## 2019-05-16 ENCOUNTER — Encounter (HOSPITAL_COMMUNITY): Payer: Self-pay | Admitting: Psychiatry

## 2019-05-16 DIAGNOSIS — F209 Schizophrenia, unspecified: Secondary | ICD-10-CM

## 2019-05-16 DIAGNOSIS — R419 Unspecified symptoms and signs involving cognitive functions and awareness: Secondary | ICD-10-CM | POA: Diagnosis not present

## 2019-05-16 MED ORDER — OLANZAPINE 2.5 MG PO TABS: ORAL_TABLET | ORAL | 0 refills | Status: DC

## 2019-05-16 MED ORDER — TRAZODONE HCL 50 MG PO TABS: 50.0000 mg | ORAL_TABLET | Freq: Every day | ORAL | 0 refills | Status: DC

## 2019-05-16 MED ORDER — OLANZAPINE 10 MG PO TABS: 10.0000 mg | ORAL_TABLET | Freq: Every day | ORAL | 0 refills | Status: DC

## 2019-05-16 NOTE — Patient Instructions (Signed)
1. Increase olanzapine 12.5 mg at night 2. Continue Trazodone 50 mg at night 3. Send instruction note to [name], Fax: [phone] 4. Next appointment: 5/13 at 1 PM 5. Please check labs (lipid panels, glucose) at her next visit with primary care

## 2019-05-21 DIAGNOSIS — B351 Tinea unguium: Secondary | ICD-10-CM | POA: Diagnosis not present

## 2019-05-21 DIAGNOSIS — E1142 Type 2 diabetes mellitus with diabetic polyneuropathy: Secondary | ICD-10-CM | POA: Diagnosis not present

## 2019-05-21 DIAGNOSIS — I739 Peripheral vascular disease, unspecified: Secondary | ICD-10-CM | POA: Diagnosis not present

## 2019-05-21 DIAGNOSIS — H401131 Primary open-angle glaucoma, bilateral, mild stage: Secondary | ICD-10-CM | POA: Diagnosis not present

## 2019-06-05 DIAGNOSIS — I10 Essential (primary) hypertension: Secondary | ICD-10-CM | POA: Diagnosis not present

## 2019-06-05 DIAGNOSIS — E1165 Type 2 diabetes mellitus with hyperglycemia: Secondary | ICD-10-CM | POA: Diagnosis not present

## 2019-06-27 ENCOUNTER — Other Ambulatory Visit: Payer: Self-pay

## 2019-06-27 MED ORDER — METOPROLOL TARTRATE 50 MG PO TABS: 75.0000 mg | ORAL_TABLET | Freq: Two times a day (BID) | ORAL | 3 refills | Status: DC

## 2019-06-27 NOTE — Telephone Encounter (Signed)
refilled lopressor to [facility] per fax request

## 2019-07-04 DIAGNOSIS — E119 Type 2 diabetes mellitus without complications: Secondary | ICD-10-CM | POA: Diagnosis not present

## 2019-07-04 DIAGNOSIS — Z1331 Encounter for screening for depression: Secondary | ICD-10-CM | POA: Diagnosis not present

## 2019-07-04 DIAGNOSIS — I10 Essential (primary) hypertension: Secondary | ICD-10-CM | POA: Diagnosis not present

## 2019-07-04 DIAGNOSIS — F315 Bipolar disorder, current episode depressed, severe, with psychotic features: Secondary | ICD-10-CM | POA: Diagnosis not present

## 2019-07-04 DIAGNOSIS — Z1389 Encounter for screening for other disorder: Secondary | ICD-10-CM | POA: Diagnosis not present

## 2019-07-08 DIAGNOSIS — Z0001 Encounter for general adult medical examination with abnormal findings: Secondary | ICD-10-CM | POA: Diagnosis not present

## 2019-07-08 DIAGNOSIS — E119 Type 2 diabetes mellitus without complications: Secondary | ICD-10-CM | POA: Diagnosis not present

## 2019-07-08 DIAGNOSIS — I10 Essential (primary) hypertension: Secondary | ICD-10-CM | POA: Diagnosis not present

## 2019-07-30 DIAGNOSIS — E1142 Type 2 diabetes mellitus with diabetic polyneuropathy: Secondary | ICD-10-CM | POA: Diagnosis not present

## 2019-07-30 DIAGNOSIS — I739 Peripheral vascular disease, unspecified: Secondary | ICD-10-CM | POA: Diagnosis not present

## 2019-07-30 DIAGNOSIS — B351 Tinea unguium: Secondary | ICD-10-CM | POA: Diagnosis not present

## 2019-08-01 NOTE — Progress Notes (Signed)
Virtual Visit via Video Note  I connected with Maria Pace on 08/08/19 at  1:00 PM EDT by a video enabled telemedicine application and verified that I am speaking with the correct person using two identifiers.   I discussed the limitations of evaluation and management by telemedicine and the availability of in person appointments. The patient expressed understanding and agreed to proceed.    I discussed the assessment and treatment plan with the patient. The patient was provided an opportunity to ask questions and all were answered. The patient agreed with the plan and demonstrated an understanding of the instructions.   The patient was advised to call back or seek an in-person evaluation if the symptoms worsen or if the condition fails to improve as anticipated.  I provided 10 minutes of non-face-to-face time during this encounter.   Norman Clay, MD    [facility] MD/PA/NP OP Progress Note  08/08/2019 1:30 PM Maria Pace  MRN:  [id number]  Chief Complaint:  Chief Complaint    Follow-up; Schizophrenia     HPI:  This is a follow-up appointment for schizophrenia.  She states that she has been doing well.  She crochets and made three scarfs. However, she quit doing it as it worn her out. She enjoys reading bibles. She denies insomnia.  She denies feeling depressed.  She occasionally feels anxious.  She denies SI.  She denies AH, VH.  She denies paranoia.  She denies ideas of reference.  She denies any tremors.   Maria Pace, the staff at [facility] family care presents to the interview.  Maria Pace has been doing well. No significant change since uptitration of olanzapine. Although she continues to talk to herself, she does not do so when she works on crochet. She takes medication regularly. No change in appetite. No safety concern.   199-200 lbs  Visit Diagnosis:    ICD-10-CM   1. Schizophrenia, unspecified type ([place])  F20.9     Past Psychiatric History: Please see initial evaluation for  full details. I have reviewed the history. No updates at this time.     Past Medical History:  Past Medical History:  Diagnosis Date  . Bipolar 1 disorder ([place])   . Essential hypertension   . GERD (gastroesophageal reflux disease)   . Hyperlipidemia   . Irregular heart beat   . Schizophrenia ([place])   . Type 2 diabetes mellitus ([place])     Past Surgical History:  Procedure Laterality Date  . YAG LASER APPLICATION Left 30/86/5784   Procedure: YAG LASER APPLICATION;  Surgeon: Rutherford Guys, MD;  Location: [facility];  Service: Ophthalmology;  Laterality: Left;    Family Psychiatric History: Please see initial evaluation for full details. I have reviewed the history. No updates at this time.     Family History:  Family History  Problem Relation Age of Onset  . Hypertension Father   . Diabetes type II Father     Social History:  Social History   Socioeconomic History  . Marital status: Single    Spouse name: Not on file  . Number of children: Not on file  . Years of education: Not on file  . Highest education level: Not on file  Occupational History  . Not on file  Tobacco Use  . Smoking status: Never Smoker  . Smokeless tobacco: Never Used  Substance and Sexual Activity  . Alcohol use: No  . Drug use: No  . Sexual activity: Never  Other Topics Concern  . Not on  file  Social History Narrative  . Not on file   Social Determinants of Health   Financial Resource Strain:   . Difficulty of Paying Living Expenses:   Food Insecurity:   . Worried About Programme researcher, broadcasting/film/video in the Last Year:   . Barista in the Last Year:   Transportation Needs:   . Freight forwarder (Medical):   Marland Kitchen Lack of Transportation (Non-Medical):   Physical Activity:   . Days of Exercise per Week:   . Minutes of Exercise per Session:   Stress:   . Feeling of Stress :   Social Connections:   . Frequency of Communication with Friends and Family:   . Frequency of Social Gatherings with  Friends and Family:   . Attends Religious Services:   . Active Member of Clubs or Organizations:   . Attends Banker Meetings:   Marland Kitchen Marital Status:     Allergies:  Allergies  Allergen Reactions  . Haloperidol Lactate Other (See Comments)    Couldn't urinate anymore    Metabolic Disorder Labs: Lab Results  Component Value Date   HGBA1C 11.0 (H) 10/28/2013   No results found for: PROLACTIN Lab Results  Component Value Date   CHOL 204 (H) 10/30/2013   TRIG 240 (H) 10/30/2013   HDL 38 (L) 10/30/2013   VLDL 48 (H) 10/30/2013   LDLCALC 118 (H) 10/30/2013   Lab Results  Component Value Date   TSH 2.634 05/18/2017   TSH 2.084 11/10/2016    Therapeutic Level Labs: No results found for: LITHIUM Lab Results  Component Value Date   VALPROATE 64 06/13/2018   VALPROATE 86.5 02/15/2018   No components found for:  CBMZ  Current Medications: Current Outpatient Medications  Medication Sig Dispense Refill  . acetaminophen (TYLENOL) 500 MG tablet Take 500 mg by mouth every 8 (eight) hours as needed for mild pain or moderate pain.    Marland Kitchen albuterol (PROAIR HFA) 108 (90 Base) MCG/ACT inhaler Inhale 2 puffs into the lungs every 6 (six) hours as needed for wheezing or shortness of breath.    Marland Kitchen atorvastatin (LIPITOR) 10 MG tablet Take 10 mg by mouth daily.    . bisacodyl (DULCOLAX) 10 MG suppository Place 1 suppository (10 mg total) rectally as needed for moderate constipation. 12 suppository 0  . Camphor-Eucalyptus-Menthol (VICKS VAPORUB) 4.7-1.2-2.6 % OINT Apply 1 application topically daily. Apply once to toenails daily    . diclofenac sodium (VOLTAREN) 1 % GEL Apply 2 g topically 2 (two) times daily as needed.    . docusate sodium (COLACE) 100 MG capsule Take 200 mg by mouth daily.    . hydrochlorothiazide (MICROZIDE) 12.5 MG capsule Take 12.5 mg by mouth daily.    Marland Kitchen latanoprost (XALATAN) 0.005 % ophthalmic solution Place 1 drop into both eyes at bedtime.    Marland Kitchen linagliptin  (TRADJENTA) 5 MG TABS tablet Take 5 mg by mouth daily.    Marland Kitchen lisinopril (PRINIVIL,ZESTRIL) 20 MG tablet Take 20 mg by mouth daily.    Marland Kitchen loratadine (CLARITIN) 10 MG tablet Take 10 mg by mouth daily.    . metFORMIN (GLUCOPHAGE) 500 MG tablet Take 500 mg by mouth 2 (two) times daily with a meal.    . metoprolol tartrate (LOPRESSOR) 50 MG tablet Take 1.5 tablets (75 mg total) by mouth 2 (two) times daily. 270 tablet 3  . OLANZapine (ZYPREXA) 10 MG tablet Take 1 tablet (10 mg total) by mouth at bedtime. 90 tablet 0  .  pantoprazole (PROTONIX) 40 MG tablet Take 40 mg by mouth daily.    . polyethylene glycol powder (GLYCOLAX/MIRALAX) powder Take 17 g by mouth daily as needed for mild constipation or moderate constipation.    . senna-docusate (SENOKOT-S) 8.6-50 MG tablet Take 2 tablets by mouth at bedtime. 60 tablet 0  . traZODone (DESYREL) 50 MG tablet Take 1 tablet (50 mg total) by mouth at bedtime. 90 tablet 0   No current facility-administered medications for this visit.     Musculoskeletal: Strength & Muscle Tone: N/A Gait & Station: N/A Patient leans: N/A  Psychiatric Specialty Exam: Review of Systems  Psychiatric/Behavioral: Negative for agitation, behavioral problems, confusion, decreased concentration, dysphoric mood, hallucinations, self-injury, sleep disturbance and suicidal ideas. The patient is not nervous/anxious and is not hyperactive.   All other systems reviewed and are negative.   There were no vitals taken for this visit.There is no height or weight on file to calculate BMI.  General Appearance: Fairly Groomed  Eye Contact:  Good  Speech:  Slow and Slurred  Volume:  Normal  Mood:  good  Affect:  Appropriate, Congruent and euthymic  Thought Process:  Coherent  Orientation:  Full (Time, Place, and Person)  Thought Content: Logical   Suicidal Thoughts:  No  Homicidal Thoughts:  No  Memory:  Immediate;   Good  Judgement:  Good  Insight:  Fair  Psychomotor Activity:   Normal  Concentration:  Concentration: Fair and Attention Span: Fair  Recall:  Good  Fund of Knowledge: Good  Language: Good  Akathisia:  No  Handed:  Right  AIMS (if indicated): not done  Assets:  Communication Skills Desire for Improvement  ADL's:  Intact  Cognition: WNL  Sleep:  Good   Screenings:   Assessment and Plan:  Maria Pace is a 79 y.o. year old female with a history of schizophrenia,hypertension, type II diabetes , who presents for follow up appointment for Schizophrenia, unspecified type (HCC)  # Schizophrenia There has been no significant behavior issues since the last visit. No tremors since tapering off risperidone (used to be on 2 mg BID).  Will taper down olanzapine for schizophrenia given no significant change since uptitration of this medication. The staff agrees to continue work on behavioral modification.  Discussed potential metabolic side effect and EPS.  Will continue trazodone for insomnia.   # Cognitive deficits She does have cognitive deficits as characterized by [place].  We will continue to monitor.   Plan 1. Decrease olanzapine 10 mg at night  2. Please send lab result to our office (lipid, glucose) 3. Continue Trazodone 50 mg at night 4.Send instruction note to [name], Fax: [phone] 5. Next appointment: 8/10 at 1 PM for 20 mins, video [phone] The patient demonstrates the following risk factors for suicide: Chronic risk factors for suicide include:psychiatric disorder ofschizophrenia. Acute risk factorsfor suicide include: unemployment. Protective factorsfor this patient include: positive social support. Considering these factors, the overall suicide risk at this point appears to below. Patientisappropriate for outpatient follow up.  [name], MD 08/08/2019, 1:30 PM

## 2019-08-03 DIAGNOSIS — E1165 Type 2 diabetes mellitus with hyperglycemia: Secondary | ICD-10-CM | POA: Diagnosis not present

## 2019-08-03 DIAGNOSIS — I10 Essential (primary) hypertension: Secondary | ICD-10-CM | POA: Diagnosis not present

## 2019-08-08 ENCOUNTER — Encounter (HOSPITAL_COMMUNITY): Payer: Self-pay | Admitting: Psychiatry

## 2019-08-08 ENCOUNTER — Telehealth (INDEPENDENT_AMBULATORY_CARE_PROVIDER_SITE_OTHER): Payer: Medicare Other | Admitting: Psychiatry

## 2019-08-08 ENCOUNTER — Other Ambulatory Visit: Payer: Self-pay

## 2019-08-08 DIAGNOSIS — F209 Schizophrenia, unspecified: Secondary | ICD-10-CM

## 2019-08-08 MED ORDER — OLANZAPINE 10 MG PO TABS: 10.0000 mg | ORAL_TABLET | Freq: Every day | ORAL | 0 refills | Status: DC

## 2019-08-08 MED ORDER — TRAZODONE HCL 50 MG PO TABS: 50.0000 mg | ORAL_TABLET | Freq: Every day | ORAL | 0 refills | Status: DC

## 2019-08-08 NOTE — Patient Instructions (Signed)
1. Decrease olanzapine 10 mg at night  2. Please send lab result to our office (lipid, glucose) 3. Continue Trazodone 50 mg at night 4.  Next appointment: 8/10 at 1 PM Sends this sheet to [facility], Fax: [phone]

## 2019-08-13 ENCOUNTER — Telehealth (HOSPITAL_COMMUNITY): Payer: Self-pay | Admitting: Psychiatry

## 2019-08-13 NOTE — Telephone Encounter (Signed)
Received fax from Dr. Felecia Shelling about patient's blood work. Na/sodium level was low: 128. Please advise the facility to contact Dr. Letitia Neri office for further evaluation/treatment if that is not addressed by the provider.

## 2019-08-13 NOTE — Telephone Encounter (Signed)
Reviewed labs collected on 07/08/2019. Report to be scanned.  Lipid panels- LDL 116, non HDL 133,  otherwise normal Na 128, Cre 1.21, Ca 6.1, Alb 4.0, Glu 116 Hb 10.3, Ht 31.4, CBC otherwise wnl

## 2019-08-14 NOTE — Telephone Encounter (Signed)
Spoke with Willaim Rayas Per Provider Request :: Received fax from Dr. Felecia Shelling about patient's blood work. Na/sodium level was low: 128. Please advise the facility to contact Dr. Letitia Neri office for further evaluation/treatment if that is not addressed by the provider.

## 2019-09-03 ENCOUNTER — Telehealth (HOSPITAL_COMMUNITY): Payer: Self-pay | Admitting: *Deleted

## 2019-09-03 DIAGNOSIS — I10 Essential (primary) hypertension: Secondary | ICD-10-CM | POA: Diagnosis not present

## 2019-09-03 DIAGNOSIS — F203 Undifferentiated schizophrenia: Secondary | ICD-10-CM | POA: Diagnosis not present

## 2019-09-03 NOTE — Telephone Encounter (Signed)
Lupita Leash with [facility] pharmacy called stating that they received patient Olanzapine 10 mg but did not receive patietn Olanzapine 2.5 mg. Per Lupita Leash to please send escribe refills to them so that they can get medication ready for patient so they can send the scrip tot the home patient lives in. Pharmacy number is [phone]

## 2019-09-03 NOTE — Telephone Encounter (Signed)
We discussed at her last visit about the plan to reduce the dose of olanzapine to 10 mg. Please instruct them about this medication change.

## 2019-09-04 NOTE — Telephone Encounter (Signed)
Spoke with pharmacy Lupita Leash) and she stated she needs a Discontinue order faxed to her so that she can send to the home for patient. Fax number is [phone].

## 2019-09-05 NOTE — Telephone Encounter (Signed)
noted 

## 2019-09-05 NOTE — Telephone Encounter (Signed)
Spoke with the pharmacist. He accepted a verbal order, and no further action is needed at this time. (He advised to comment in an order itself if any change in medication dose in the future)

## 2019-10-03 ENCOUNTER — Other Ambulatory Visit: Payer: Self-pay

## 2019-10-03 ENCOUNTER — Inpatient Hospital Stay (HOSPITAL_COMMUNITY)
Admission: EM | Admit: 2019-10-03 | Discharge: 2019-10-11 | DRG: 682 | Disposition: A | Discharge status: Other Acute Inpatient Hospital | Payer: Medicare Other | Source: Skilled Nursing Facility | Attending: Internal Medicine | Admitting: Internal Medicine

## 2019-10-03 ENCOUNTER — Encounter (HOSPITAL_COMMUNITY): Payer: Self-pay | Admitting: Emergency Medicine

## 2019-10-03 DIAGNOSIS — Z8249 Family history of ischemic heart disease and other diseases of the circulatory system: Secondary | ICD-10-CM

## 2019-10-03 DIAGNOSIS — R823 Hemoglobinuria: Secondary | ICD-10-CM | POA: Diagnosis present

## 2019-10-03 DIAGNOSIS — I10 Essential (primary) hypertension: Secondary | ICD-10-CM | POA: Diagnosis present

## 2019-10-03 DIAGNOSIS — E782 Mixed hyperlipidemia: Secondary | ICD-10-CM | POA: Diagnosis present

## 2019-10-03 DIAGNOSIS — E785 Hyperlipidemia, unspecified: Secondary | ICD-10-CM | POA: Diagnosis present

## 2019-10-03 DIAGNOSIS — I110 Hypertensive heart disease with heart failure: Secondary | ICD-10-CM | POA: Diagnosis present

## 2019-10-03 DIAGNOSIS — E871 Hypo-osmolality and hyponatremia: Secondary | ICD-10-CM | POA: Diagnosis not present

## 2019-10-03 DIAGNOSIS — I639 Cerebral infarction, unspecified: Secondary | ICD-10-CM | POA: Diagnosis not present

## 2019-10-03 DIAGNOSIS — E872 Acidosis, unspecified: Secondary | ICD-10-CM

## 2019-10-03 DIAGNOSIS — Z7984 Long term (current) use of oral hypoglycemic drugs: Secondary | ICD-10-CM

## 2019-10-03 DIAGNOSIS — E559 Vitamin D deficiency, unspecified: Secondary | ICD-10-CM | POA: Diagnosis present

## 2019-10-03 DIAGNOSIS — D72829 Elevated white blood cell count, unspecified: Secondary | ICD-10-CM

## 2019-10-03 DIAGNOSIS — E538 Deficiency of other specified B group vitamins: Secondary | ICD-10-CM | POA: Diagnosis present

## 2019-10-03 DIAGNOSIS — F23 Brief psychotic disorder: Secondary | ICD-10-CM

## 2019-10-03 DIAGNOSIS — Z20822 Contact with and (suspected) exposure to covid-19: Secondary | ICD-10-CM | POA: Diagnosis not present

## 2019-10-03 DIAGNOSIS — I5042 Chronic combined systolic (congestive) and diastolic (congestive) heart failure: Secondary | ICD-10-CM

## 2019-10-03 DIAGNOSIS — F209 Schizophrenia, unspecified: Secondary | ICD-10-CM | POA: Diagnosis present

## 2019-10-03 DIAGNOSIS — I480 Paroxysmal atrial fibrillation: Secondary | ICD-10-CM | POA: Diagnosis present

## 2019-10-03 DIAGNOSIS — R748 Abnormal levels of other serum enzymes: Secondary | ICD-10-CM

## 2019-10-03 DIAGNOSIS — I471 Supraventricular tachycardia, unspecified: Secondary | ICD-10-CM

## 2019-10-03 DIAGNOSIS — N179 Acute kidney failure, unspecified: Principal | ICD-10-CM | POA: Diagnosis present

## 2019-10-03 DIAGNOSIS — E869 Volume depletion, unspecified: Secondary | ICD-10-CM | POA: Diagnosis present

## 2019-10-03 DIAGNOSIS — Z833 Family history of diabetes mellitus: Secondary | ICD-10-CM

## 2019-10-03 DIAGNOSIS — E119 Type 2 diabetes mellitus without complications: Secondary | ICD-10-CM

## 2019-10-03 DIAGNOSIS — Z79899 Other long term (current) drug therapy: Secondary | ICD-10-CM

## 2019-10-03 DIAGNOSIS — K219 Gastro-esophageal reflux disease without esophagitis: Secondary | ICD-10-CM | POA: Diagnosis present

## 2019-10-03 DIAGNOSIS — G9341 Metabolic encephalopathy: Secondary | ICD-10-CM | POA: Diagnosis present

## 2019-10-03 LAB — CBC
HCT: 32.8 % — ABNORMAL LOW (ref 36.0–46.0)
Hemoglobin: 10.6 g/dL — ABNORMAL LOW (ref 12.0–15.0)
MCH: 27.6 pg (ref 26.0–34.0)
MCHC: 32.3 g/dL (ref 30.0–36.0)
MCV: 85.4 fL (ref 80.0–100.0)
Platelets: 298 10*3/uL (ref 150–400)
RBC: 3.84 MIL/uL — ABNORMAL LOW (ref 3.87–5.11)
RDW: 13.8 % (ref 11.5–15.5)
WBC: 13.1 10*3/uL — ABNORMAL HIGH (ref 4.0–10.5)
nRBC: 0 % (ref 0.0–0.2)

## 2019-10-03 LAB — COMPREHENSIVE METABOLIC PANEL
ALT: 18 U/L (ref 0–44)
AST: 24 U/L (ref 15–41)
Albumin: 4.3 g/dL (ref 3.5–5.0)
Alkaline Phosphatase: 81 U/L (ref 38–126)
Anion gap: >20 — ABNORMAL HIGH (ref 5–15)
BUN: 29 mg/dL — ABNORMAL HIGH (ref 8–23)
CO2: 17 mmol/L — ABNORMAL LOW (ref 22–32)
Calcium: 9.1 mg/dL (ref 8.9–10.3)
Chloride: 86 mmol/L — ABNORMAL LOW (ref 98–111)
Creatinine, Ser: 2.3 mg/dL — ABNORMAL HIGH (ref 0.44–1.00)
GFR calc Af Amer: 23 mL/min — ABNORMAL LOW (ref >60)
GFR calc non Af Amer: 20 mL/min — ABNORMAL LOW (ref >60)
Glucose, Bld: 182 mg/dL — ABNORMAL HIGH (ref 70–99)
Potassium: 3.9 mmol/L (ref 3.5–5.1)
Sodium: 128 mmol/L — ABNORMAL LOW (ref 135–145)
Total Bilirubin: 0.7 mg/dL (ref 0.3–1.2)
Total Protein: 8.1 g/dL (ref 6.5–8.1)

## 2019-10-03 LAB — URINALYSIS, ROUTINE W REFLEX MICROSCOPIC
Bacteria, UA: NONE SEEN
Bilirubin Urine: NEGATIVE
Glucose, UA: NEGATIVE mg/dL
Ketones, ur: NEGATIVE mg/dL
Nitrite: POSITIVE — AB
Protein, ur: NEGATIVE mg/dL
Specific Gravity, Urine: 1.004 — ABNORMAL LOW (ref 1.005–1.030)
pH: 6 (ref 5.0–8.0)

## 2019-10-03 LAB — RAPID URINE DRUG SCREEN, HOSP PERFORMED
Amphetamines: NOT DETECTED
Barbiturates: NOT DETECTED
Benzodiazepines: NOT DETECTED
Cocaine: NOT DETECTED
Opiates: NOT DETECTED
Tetrahydrocannabinol: NOT DETECTED

## 2019-10-03 LAB — ACETAMINOPHEN LEVEL: Acetaminophen (Tylenol), Serum: <10 ug/mL — ABNORMAL LOW (ref 10–30)

## 2019-10-03 LAB — CK: Total CK: 381 U/L — ABNORMAL HIGH (ref 38–234)

## 2019-10-03 LAB — ETHANOL: Alcohol, Ethyl (B): <10 mg/dL (ref <10)

## 2019-10-03 LAB — SARS CORONAVIRUS 2 BY RT PCR (HOSPITAL ORDER, PERFORMED IN ~~LOC~~ HOSPITAL LAB): SARS Coronavirus 2: NEGATIVE

## 2019-10-03 LAB — SALICYLATE LEVEL: Salicylate Lvl: <7 mg/dL — ABNORMAL LOW (ref 7.0–30.0)

## 2019-10-03 MED ORDER — CAMPHOR-PHENOL 10.8-4.7 % EX LIQD
Freq: Every day | CUTANEOUS | Status: DC
  Filled 2019-10-03: qty 45

## 2019-10-03 MED ORDER — DOCUSATE SODIUM 100 MG PO CAPS
200.0000 mg | ORAL_CAPSULE | Freq: Every day | ORAL | Status: DC
  Administered 2019-10-04 – 2019-10-11 (×7): 200 mg via ORAL
  Filled 2019-10-03 (×8): qty 2

## 2019-10-03 MED ORDER — ACETAMINOPHEN 650 MG RE SUPP: 650.0000 mg | Freq: Four times a day (QID) | RECTAL | Status: DC | PRN

## 2019-10-03 MED ORDER — LORATADINE 10 MG PO TABS
10.0000 mg | ORAL_TABLET | Freq: Every day | ORAL | Status: DC
  Administered 2019-10-04 – 2019-10-09 (×5): 10 mg via ORAL
  Filled 2019-10-03 (×6): qty 1

## 2019-10-03 MED ORDER — ONDANSETRON HCL 4 MG PO TABS: 4.0000 mg | ORAL_TABLET | Freq: Four times a day (QID) | ORAL | Status: DC | PRN

## 2019-10-03 MED ORDER — SENNOSIDES-DOCUSATE SODIUM 8.6-50 MG PO TABS
2.0000 | ORAL_TABLET | Freq: Every day | ORAL | Status: DC
  Administered 2019-10-03 – 2019-10-10 (×7): 2 via ORAL
  Filled 2019-10-03 (×8): qty 2

## 2019-10-03 MED ORDER — METOPROLOL TARTRATE 5 MG/5ML IV SOLN
5.0000 mg | Freq: Once | INTRAVENOUS | Status: AC
  Administered 2019-10-03: 5 mg via INTRAVENOUS
  Filled 2019-10-03: qty 5

## 2019-10-03 MED ORDER — RISPERIDONE 1 MG PO TABS
1.0000 mg | ORAL_TABLET | Freq: Every day | ORAL | Status: DC
  Administered 2019-10-03 – 2019-10-10 (×8): 1 mg via ORAL
  Filled 2019-10-03 (×8): qty 1

## 2019-10-03 MED ORDER — LACTATED RINGERS IV BOLUS
1000.0000 mL | Freq: Once | INTRAVENOUS | Status: AC
  Administered 2019-10-03: 1000 mL via INTRAVENOUS

## 2019-10-03 MED ORDER — TRAZODONE HCL 50 MG PO TABS
50.0000 mg | ORAL_TABLET | Freq: Every day | ORAL | Status: DC
  Administered 2019-10-03 – 2019-10-10 (×8): 50 mg via ORAL
  Filled 2019-10-03 (×8): qty 1

## 2019-10-03 MED ORDER — ONDANSETRON HCL 4 MG/2ML IJ SOLN: 4.0000 mg | Freq: Four times a day (QID) | INTRAMUSCULAR | Status: DC | PRN

## 2019-10-03 MED ORDER — ENOXAPARIN SODIUM 30 MG/0.3ML ~~LOC~~ SOLN
30.0000 mg | SUBCUTANEOUS | Status: DC
  Administered 2019-10-03 – 2019-10-04 (×2): 30 mg via SUBCUTANEOUS
  Filled 2019-10-03 (×2): qty 0.3

## 2019-10-03 MED ORDER — SODIUM CHLORIDE 0.9 % IV BOLUS
1000.0000 mL | Freq: Once | INTRAVENOUS | Status: AC
  Administered 2019-10-03: 1000 mL via INTRAVENOUS

## 2019-10-03 MED ORDER — ACETAMINOPHEN 325 MG PO TABS: 650.0000 mg | ORAL_TABLET | Freq: Four times a day (QID) | ORAL | Status: DC | PRN

## 2019-10-03 MED ORDER — LORAZEPAM 2 MG/ML IJ SOLN
1.0000 mg | INTRAMUSCULAR | Status: DC | PRN
  Administered 2019-10-03 – 2019-10-08 (×5): 1 mg via INTRAVENOUS
  Filled 2019-10-03 (×5): qty 1

## 2019-10-03 MED ORDER — LINAGLIPTIN 5 MG PO TABS: 5.0000 mg | ORAL_TABLET | Freq: Every day | ORAL | Status: DC

## 2019-10-03 MED ORDER — INSULIN ASPART 100 UNIT/ML ~~LOC~~ SOLN
0.0000 [IU] | Freq: Three times a day (TID) | SUBCUTANEOUS | Status: DC
  Administered 2019-10-04 – 2019-10-05 (×2): 3 [IU] via SUBCUTANEOUS
  Administered 2019-10-06: 8 [IU] via SUBCUTANEOUS
  Administered 2019-10-06 – 2019-10-07 (×2): 2 [IU] via SUBCUTANEOUS
  Administered 2019-10-07: 5 [IU] via SUBCUTANEOUS
  Administered 2019-10-07: 2 [IU] via SUBCUTANEOUS
  Administered 2019-10-08 (×2): 5 [IU] via SUBCUTANEOUS
  Administered 2019-10-09 – 2019-10-10 (×4): 2 [IU] via SUBCUTANEOUS
  Administered 2019-10-10: 8 [IU] via SUBCUTANEOUS
  Administered 2019-10-11: 2 [IU] via SUBCUTANEOUS

## 2019-10-03 MED ORDER — LATANOPROST 0.005 % OP SOLN
1.0000 [drp] | Freq: Every day | OPHTHALMIC | Status: DC
  Administered 2019-10-05 – 2019-10-10 (×5): 1 [drp] via OPHTHALMIC
  Filled 2019-10-03 (×2): qty 2.5

## 2019-10-03 MED ORDER — POLYETHYLENE GLYCOL 3350 17 G PO PACK: 17.0000 g | PACK | Freq: Every day | ORAL | Status: DC | PRN

## 2019-10-03 MED ORDER — METOPROLOL TARTRATE 50 MG PO TABS
75.0000 mg | ORAL_TABLET | Freq: Two times a day (BID) | ORAL | Status: DC
  Administered 2019-10-03 – 2019-10-05 (×4): 75 mg via ORAL
  Filled 2019-10-03 (×4): qty 1

## 2019-10-03 MED ORDER — VICKS VAPORUB 4.7-1.2-2.6 % EX OINT: 1.0000 "application " | TOPICAL_OINTMENT | Freq: Every day | CUTANEOUS | Status: DC

## 2019-10-03 MED ORDER — OLANZAPINE 5 MG PO TABS
10.0000 mg | ORAL_TABLET | Freq: Every day | ORAL | Status: DC
  Administered 2019-10-03 – 2019-10-10 (×8): 10 mg via ORAL
  Filled 2019-10-03 (×8): qty 2

## 2019-10-03 MED ORDER — SODIUM CHLORIDE 0.9 % IV SOLN: INTRAVENOUS | Status: DC

## 2019-10-03 MED ORDER — PANTOPRAZOLE SODIUM 40 MG PO TBEC
40.0000 mg | DELAYED_RELEASE_TABLET | Freq: Every day | ORAL | Status: DC
  Administered 2019-10-04 – 2019-10-11 (×7): 40 mg via ORAL
  Filled 2019-10-03 (×9): qty 1

## 2019-10-03 NOTE — ED Provider Notes (Signed)
[facility] Provider Note   CSN: [id number] Arrival date & time: 10/03/19  1741     History Chief Complaint  Patient presents with  .     Maria Pace is a 79 y.o. female with a history of bipolar disorder, hypertension, type 2 diabetes, GERD and schizophrenia presenting from her assisted living facility with involuntary commitment papers (taken out by the assisted living facility) secondary to suspected acute psychosis.  She was found in her room with the door locked and found lying on the floor.  She had broken her bed, knocked her TV off the stand and told the staff that everything in her room that had color needed to be removed as "it is the devil".  The facility believes she may have not been taking her psych medications.  The history is provided by the patient, the nursing home and medical records.       Past Medical History:  Diagnosis Date  . Bipolar 1 disorder (HCC)   . Essential hypertension   . GERD (gastroesophageal reflux disease)   . Hyperlipidemia   . Irregular heart beat   . Schizophrenia (HCC)   . Type 2 diabetes mellitus [facility](HCC)     Patient Active Problem List   Diagnosis Date Noted  . Schizophrenia (HCC) 11/15/2017  . Hypotension 02/20/2017  . Severe dehydration 02/20/2017  . AKI (acute kidney injury) (HCC) 02/20/2017  . Constipation 02/20/2017  . Lactic acidosis 02/20/2017  . Urinary retention 11/10/2016  . Hypertension 11/10/2016  . Diabetes mellitus without complication (HCC) 11/10/2016  . High cholesterol 11/10/2016  . Hyponatremia 11/10/2016  . SBO (small bowel obstruction) (HCC) 11/03/2015    Past Surgical History:  Procedure Laterality Date  . YAG LASER APPLICATION Left 01/06/2015   Procedure: YAG LASER APPLICATION;  Surgeon: Jethro BolusMark Shapiro, MD;  Location: [facility];  Service: Ophthalmology;  Laterality: Left;     OB History   No obstetric history on file.     Family History  Problem Relation Age of Onset  .  Hypertension Father   . Diabetes type II Father     Social History   Tobacco Use  . Smoking status: Never Smoker  . Smokeless tobacco: Never Used  Vaping Use  . Vaping Use: Never used  Substance Use Topics  . Alcohol use: No  . Drug use: No    Home Medications Prior to Admission medications   Medication Sig Start Date End Date Taking? Authorizing Provider  acetaminophen (TYLENOL) 500 MG tablet Take 500 mg by mouth every 8 (eight) hours as needed for mild pain or moderate pain.   Yes [provider]  atorvastatin (LIPITOR) 10 MG tablet Take 10 mg by mouth daily.   Yes [provider]  Camphor-Eucalyptus-Menthol (VICKS VAPORUB) 4.7-1.2-2.6 % OINT Apply 1 application topically daily. Apply once to toenails daily   Yes [provider]  docusate sodium (COLACE) 100 MG capsule Take 200 mg by mouth daily.   Yes [provider]  hydrochlorothiazide (MICROZIDE) 12.5 MG capsule Take 12.5 mg by mouth daily.   Yes [provider]  latanoprost (XALATAN) 0.005 % ophthalmic solution Place 1 drop into both eyes at bedtime.   Yes [provider]  linagliptin (TRADJENTA) 5 MG TABS tablet Take 5 mg by mouth daily.   Yes [provider]  lisinopril (PRINIVIL,ZESTRIL) 20 MG tablet Take 20 mg by mouth daily.   Yes [provider]  loratadine (CLARITIN) 10 MG tablet Take 10 mg by  mouth daily.   Yes [provider]  metFORMIN (GLUCOPHAGE) 500 MG tablet Take 500 mg by mouth 2 (two) times daily with a meal.   Yes [provider]  metoprolol tartrate (LOPRESSOR) 50 MG tablet Take 1.5 tablets (75 mg total) by mouth 2 (two) times daily. 06/27/19  Yes Jonelle Sidle, MD  OLANZapine (ZYPREXA) 10 MG tablet Take 1 tablet (10 mg total) by mouth at bedtime. 08/08/19  Yes Hisada, Barbee Cough, MD  pantoprazole (PROTONIX) 40 MG tablet Take 40 mg by mouth daily.   Yes [provider]  polyethylene glycol powder (GLYCOLAX/MIRALAX)  powder Take 17 g by mouth daily as needed for mild constipation or moderate constipation.   Yes [provider]  risperiDONE (RISPERDAL) 1 MG tablet Take 1 mg by mouth at bedtime.   Yes [provider]  senna-docusate (SENOKOT-S) 8.6-50 MG tablet Take 2 tablets by mouth at bedtime. 02/23/17  Yes Erick Blinks, MD  traZODone (DESYREL) 50 MG tablet Take 1 tablet (50 mg total) by mouth at bedtime. 08/08/19  Yes Hisada, Barbee Cough, MD  bisacodyl (DULCOLAX) 10 MG suppository Place 1 suppository (10 mg total) rectally as needed for moderate constipation. Patient not taking: Reported on 10/03/2019 02/23/17   Erick Blinks, MD    Allergies    Haloperidol lactate  Review of Systems   Review of Systems  Unable to perform ROS: Psychiatric disorder (Pt refuses to talk to me.)  Psychiatric/Behavioral: Positive for behavioral problems and hallucinations.    Physical Exam Updated Vital Signs BP (!) 139/108   Pulse 60   Temp 98.8 F (37.1 C) (Oral)   Resp 16   Ht 5' (1.524 m)   Wt 88.9 kg   SpO2 98%   BMI 38.28 kg/m   Physical Exam Vitals and nursing note reviewed.  Constitutional:      Appearance: She is well-developed.     Comments: Patient is uncooperative for the majority of her exam.  She refused abdominal exam.  HENT:     Head: Normocephalic and atraumatic.  Eyes:     Conjunctiva/sclera: Conjunctivae normal.  Cardiovascular:     Rate and Rhythm: Normal rate.     Heart sounds: Normal heart sounds.  Pulmonary:     Effort: Pulmonary effort is normal.     Breath sounds: Normal breath sounds. No wheezing or rhonchi.  Abdominal:     General: Bowel sounds are normal.     Palpations: Abdomen is soft.     Tenderness: There is no abdominal tenderness. There is no guarding.  Skin:    General: Skin is warm and dry.  Neurological:     Mental Status: She is alert.     Comments: Awake and alert.  Psychiatric:        Mood and Affect: Affect is labile and inappropriate.          Speech: She is noncommunicative.        Behavior: Behavior is uncooperative.     Comments: Patient uncooperative for exam and nonverbal.  She will shake her head yes or no to questions asked.     ED Results / Procedures / Treatments   Labs (all labs ordered are listed, but only abnormal results are displayed) Labs Reviewed  COMPREHENSIVE METABOLIC PANEL - Abnormal; Notable for the following components:      Result Value   Sodium 128 (*)    Chloride 86 (*)    CO2 17 (*)    Glucose, Bld 182 (*)  BUN 29 (*)    Creatinine, Ser 2.30 (*)    GFR calc non Af Amer 20 (*)    GFR calc Af Amer 23 (*)    Anion gap >20 (*)    All other components within normal limits  SALICYLATE LEVEL - Abnormal; Notable for the following components:   Salicylate Lvl <7.0 (*)    All other components within normal limits  ACETAMINOPHEN LEVEL - Abnormal; Notable for the following components:   Acetaminophen (Tylenol), Serum <10 (*)    All other components within normal limits  CBC - Abnormal; Notable for the following components:   WBC 13.1 (*)    RBC 3.84 (*)    Hemoglobin 10.6 (*)    HCT 32.8 (*)    All other components within normal limits  SARS CORONAVIRUS 2 BY RT PCR (HOSPITAL ORDER, PERFORMED IN [facility])  ETHANOL  RAPID URINE DRUG SCREEN, HOSP PERFORMED    EKG None  Radiology No results found.  Procedures Procedures (including critical care time)  Medications Ordered in ED Medications  sodium chloride 0.9 % bolus 1,000 mL (has no administration in time range)    ED Course  I have reviewed the triage vital signs and the nursing notes.  Pertinent labs & imaging results that were available during my care of the patient were reviewed by me and considered in my medical decision making (see chart for details).  Clinical Course as of Oct 02 2001  Thu Oct 03, 2019  1957 79 yo female w/ schizophrenia presenting to the ED from behavioral health facility with concern for  erratic behavior and patient not taking her psych medications.  Patient would not communicate with the ED team on arrival.  Her vitals are within normal limits.  Her labs are notable for an AKI with creatinine doubling to 2.3 today (from last year's level) and an anion gap > 20, with no elevated BS.  Given these findings, I think it is reasonable to admit to the hospitalist.  She otherwise appears stable.  I did fill out an IVC as I do not think she is consentable for treatment and is limited by severe psychiatric mental illness (see PA provider note for further hx)   [MT]    Clinical Course User Index [MT] Trifan, Kermit Balo, MD   MDM Rules/Calculators/A&P                          Initially presents with involuntary commitment and suspected exacerbation of her schizophrenia, assisted living facility suspect she has not been taking her psychiatric medications, but this has not been confirmed.  She has significant derangement of her lab tests here including acute kidney injury with a creatinine of 2.3.  She also has a CO2 of 17 and an anion gap of greater than 20. her glucose level is 182.  She is anemic with a hemoglobin of 10.6 but this is stable from prior draws.  Salicylate level is less than 7.  Patient with schizophrenia and hallucinations, possibly exacerbated by metabolic encephalopathy.  She will require admission as she is not medically cleared.  A liter of normal saline has been ordered to help with kidney injury.  Her sodium is also low at 128.  At re-exam, pt is more cooperative and did allow exam, but also still altered.  Reciting the names of her family members during exam.   Call to Dr. Robb Matar who accepts pt for admission. Final  Clinical Impression(s) / ED Diagnoses Final diagnoses:  Metabolic acidosis  Hyponatremia  AKI (acute kidney injury) (HCC)  Schizophrenia, acute [facility])    Rx / DC Orders ED Discharge Orders    None       Victoriano Lain 10/03/19 2014    Terald Sleeper, MD 10/04/19 1030
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
## 2019-10-03 NOTE — ED Notes (Signed)
Attempted to Fax IVC paperwork to [facility]. Fax numbers are not working per [employer]	[place].
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
## 2019-10-03 NOTE — ED Notes (Signed)
Pt changed into burgundy scrubs; pt has safety pins on her bra and is refusing to give to staff
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
## 2019-10-04 ENCOUNTER — Inpatient Hospital Stay (HOSPITAL_COMMUNITY): Payer: Medicare Other

## 2019-10-04 DIAGNOSIS — I110 Hypertensive heart disease with heart failure: Secondary | ICD-10-CM | POA: Diagnosis present

## 2019-10-04 DIAGNOSIS — I480 Paroxysmal atrial fibrillation: Secondary | ICD-10-CM | POA: Diagnosis present

## 2019-10-04 DIAGNOSIS — N281 Cyst of kidney, acquired: Secondary | ICD-10-CM | POA: Diagnosis not present

## 2019-10-04 DIAGNOSIS — Z79899 Other long term (current) drug therapy: Secondary | ICD-10-CM | POA: Diagnosis not present

## 2019-10-04 DIAGNOSIS — Z8249 Family history of ischemic heart disease and other diseases of the circulatory system: Secondary | ICD-10-CM | POA: Diagnosis not present

## 2019-10-04 DIAGNOSIS — J3489 Other specified disorders of nose and nasal sinuses: Secondary | ICD-10-CM | POA: Diagnosis not present

## 2019-10-04 DIAGNOSIS — I471 Supraventricular tachycardia: Secondary | ICD-10-CM | POA: Diagnosis present

## 2019-10-04 DIAGNOSIS — Z833 Family history of diabetes mellitus: Secondary | ICD-10-CM | POA: Diagnosis not present

## 2019-10-04 DIAGNOSIS — Z7984 Long term (current) use of oral hypoglycemic drugs: Secondary | ICD-10-CM | POA: Diagnosis not present

## 2019-10-04 DIAGNOSIS — E871 Hypo-osmolality and hyponatremia: Secondary | ICD-10-CM

## 2019-10-04 DIAGNOSIS — I639 Cerebral infarction, unspecified: Secondary | ICD-10-CM | POA: Diagnosis not present

## 2019-10-04 DIAGNOSIS — F209 Schizophrenia, unspecified: Secondary | ICD-10-CM

## 2019-10-04 DIAGNOSIS — E869 Volume depletion, unspecified: Secondary | ICD-10-CM | POA: Diagnosis present

## 2019-10-04 DIAGNOSIS — E785 Hyperlipidemia, unspecified: Secondary | ICD-10-CM | POA: Diagnosis not present

## 2019-10-04 DIAGNOSIS — E538 Deficiency of other specified B group vitamins: Secondary | ICD-10-CM | POA: Diagnosis present

## 2019-10-04 DIAGNOSIS — I63233 Cerebral infarction due to unspecified occlusion or stenosis of bilateral carotid arteries: Secondary | ICD-10-CM | POA: Diagnosis not present

## 2019-10-04 DIAGNOSIS — E559 Vitamin D deficiency, unspecified: Secondary | ICD-10-CM | POA: Diagnosis present

## 2019-10-04 DIAGNOSIS — G9340 Encephalopathy, unspecified: Secondary | ICD-10-CM | POA: Diagnosis not present

## 2019-10-04 DIAGNOSIS — I6782 Cerebral ischemia: Secondary | ICD-10-CM | POA: Diagnosis not present

## 2019-10-04 DIAGNOSIS — R748 Abnormal levels of other serum enzymes: Secondary | ICD-10-CM

## 2019-10-04 DIAGNOSIS — Z20822 Contact with and (suspected) exposure to covid-19: Secondary | ICD-10-CM | POA: Diagnosis present

## 2019-10-04 DIAGNOSIS — G9341 Metabolic encephalopathy: Secondary | ICD-10-CM | POA: Diagnosis present

## 2019-10-04 DIAGNOSIS — E119 Type 2 diabetes mellitus without complications: Secondary | ICD-10-CM | POA: Diagnosis present

## 2019-10-04 DIAGNOSIS — K219 Gastro-esophageal reflux disease without esophagitis: Secondary | ICD-10-CM

## 2019-10-04 DIAGNOSIS — I10 Essential (primary) hypertension: Secondary | ICD-10-CM

## 2019-10-04 DIAGNOSIS — N179 Acute kidney failure, unspecified: Secondary | ICD-10-CM | POA: Diagnosis present

## 2019-10-04 DIAGNOSIS — I361 Nonrheumatic tricuspid (valve) insufficiency: Secondary | ICD-10-CM | POA: Diagnosis not present

## 2019-10-04 DIAGNOSIS — I5042 Chronic combined systolic (congestive) and diastolic (congestive) heart failure: Secondary | ICD-10-CM | POA: Diagnosis not present

## 2019-10-04 DIAGNOSIS — E872 Acidosis: Secondary | ICD-10-CM | POA: Diagnosis present

## 2019-10-04 DIAGNOSIS — E782 Mixed hyperlipidemia: Secondary | ICD-10-CM | POA: Diagnosis present

## 2019-10-04 DIAGNOSIS — F203 Undifferentiated schizophrenia: Secondary | ICD-10-CM | POA: Diagnosis not present

## 2019-10-04 DIAGNOSIS — R823 Hemoglobinuria: Secondary | ICD-10-CM | POA: Diagnosis present

## 2019-10-04 DIAGNOSIS — I340 Nonrheumatic mitral (valve) insufficiency: Secondary | ICD-10-CM | POA: Diagnosis not present

## 2019-10-04 LAB — CBC
HCT: 27.8 % — ABNORMAL LOW (ref 36.0–46.0)
Hemoglobin: 8.7 g/dL — ABNORMAL LOW (ref 12.0–15.0)
MCH: 27.3 pg (ref 26.0–34.0)
MCHC: 31.3 g/dL (ref 30.0–36.0)
MCV: 87.1 fL (ref 80.0–100.0)
Platelets: 214 10*3/uL (ref 150–400)
RBC: 3.19 MIL/uL — ABNORMAL LOW (ref 3.87–5.11)
RDW: 13.7 % (ref 11.5–15.5)
WBC: 12.9 10*3/uL — ABNORMAL HIGH (ref 4.0–10.5)
nRBC: 0 % (ref 0.0–0.2)

## 2019-10-04 LAB — GLUCOSE, CAPILLARY
Glucose-Capillary: 170 mg/dL — ABNORMAL HIGH (ref 70–99)
Glucose-Capillary: 143 mg/dL — ABNORMAL HIGH (ref 70–99)
Glucose-Capillary: 86 mg/dL (ref 70–99)
Glucose-Capillary: 172 mg/dL — ABNORMAL HIGH (ref 70–99)

## 2019-10-04 LAB — IRON AND TIBC
Iron: 50 ug/dL (ref 28–170)
Saturation Ratios: 12 % (ref 10.4–31.8)
TIBC: 423 ug/dL (ref 250–450)
UIBC: 373 ug/dL

## 2019-10-04 LAB — BASIC METABOLIC PANEL
Anion gap: 8 (ref 5–15)
BUN: 21 mg/dL (ref 8–23)
CO2: 22 mmol/L (ref 22–32)
Calcium: 7.9 mg/dL — ABNORMAL LOW (ref 8.9–10.3)
Chloride: 100 mmol/L (ref 98–111)
Creatinine, Ser: 1.56 mg/dL — ABNORMAL HIGH (ref 0.44–1.00)
GFR calc Af Amer: 37 mL/min — ABNORMAL LOW (ref >60)
GFR calc non Af Amer: 32 mL/min — ABNORMAL LOW (ref >60)
Glucose, Bld: 139 mg/dL — ABNORMAL HIGH (ref 70–99)
Potassium: 3.5 mmol/L (ref 3.5–5.1)
Sodium: 130 mmol/L — ABNORMAL LOW (ref 135–145)

## 2019-10-04 LAB — RETICULOCYTES
Immature Retic Fract: 4.9 % (ref 2.3–15.9)
RBC.: 3.16 MIL/uL — ABNORMAL LOW (ref 3.87–5.11)
Retic Count, Absolute: 33.5 10*3/uL (ref 19.0–186.0)
Retic Ct Pct: 1.1 % (ref 0.4–3.1)

## 2019-10-04 LAB — HEMOGLOBIN A1C
Hgb A1c MFr Bld: 6.3 % — ABNORMAL HIGH (ref 4.8–5.6)
Mean Plasma Glucose: 134.11 mg/dL

## 2019-10-04 LAB — CK: Total CK: 390 U/L — ABNORMAL HIGH (ref 38–234)

## 2019-10-04 LAB — ALBUMIN: Albumin: 4.3 g/dL (ref 3.5–5.0)

## 2019-10-04 LAB — FOLATE: Folate: 29.3 ng/mL (ref >5.9)

## 2019-10-04 LAB — MRSA PCR SCREENING: MRSA by PCR: NEGATIVE

## 2019-10-04 LAB — FERRITIN: Ferritin: 40 ng/mL (ref 11–307)

## 2019-10-04 LAB — VITAMIN B12: Vitamin B-12: 211 pg/mL (ref 180–914)

## 2019-10-04 MED ORDER — BISACODYL 10 MG RE SUPP: 10.0000 mg | RECTAL | Status: DC | PRN

## 2019-10-04 MED ORDER — METOPROLOL TARTRATE 5 MG/5ML IV SOLN
5.0000 mg | Freq: Once | INTRAVENOUS | Status: AC
  Administered 2019-10-04: 5 mg via INTRAVENOUS
  Filled 2019-10-04: qty 5

## 2019-10-04 MED ORDER — LACTATED RINGERS IV BOLUS
1000.0000 mL | Freq: Once | INTRAVENOUS | Status: AC
  Administered 2019-10-04: 1000 mL via INTRAVENOUS

## 2019-10-04 MED ORDER — SODIUM CHLORIDE 0.9 % IV SOLN
1.0000 g | INTRAVENOUS | Status: AC
  Administered 2019-10-04 – 2019-10-06 (×3): 1 g via INTRAVENOUS
  Filled 2019-10-04 (×3): qty 10

## 2019-10-04 MED ORDER — CHLORHEXIDINE GLUCONATE CLOTH 2 % EX PADS
6.0000 | MEDICATED_PAD | Freq: Every day | CUTANEOUS | Status: DC
  Administered 2019-10-04 – 2019-10-09 (×3): 6 via TOPICAL
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
## 2019-10-04 NOTE — Progress Notes (Signed)
Patient's HR increasing to 150's frequently and briefly sustaining. Midlevel aware.
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
## 2019-10-04 NOTE — Progress Notes (Signed)
Pt BP has been soft between 3 and 4 am. Dr Robb Matar paged and orders received for a 1L bolus of LR. He suspects hypotension is from nightly dose of oral pysch meds. BP has since resolved and been stable since bolus.

## 2019-10-05 LAB — GLUCOSE, CAPILLARY
Glucose-Capillary: 80 mg/dL (ref 70–99)
Glucose-Capillary: 189 mg/dL — ABNORMAL HIGH (ref 70–99)
Glucose-Capillary: 74 mg/dL (ref 70–99)
Glucose-Capillary: 89 mg/dL (ref 70–99)

## 2019-10-05 LAB — BASIC METABOLIC PANEL
Anion gap: 7 (ref 5–15)
BUN: 16 mg/dL (ref 8–23)
CO2: 24 mmol/L (ref 22–32)
Calcium: 8.5 mg/dL — ABNORMAL LOW (ref 8.9–10.3)
Chloride: 102 mmol/L (ref 98–111)
Creatinine, Ser: 1.11 mg/dL — ABNORMAL HIGH (ref 0.44–1.00)
GFR calc Af Amer: 56 mL/min — ABNORMAL LOW (ref >60)
GFR calc non Af Amer: 48 mL/min — ABNORMAL LOW (ref >60)
Glucose, Bld: 113 mg/dL — ABNORMAL HIGH (ref 70–99)
Potassium: 3.9 mmol/L (ref 3.5–5.1)
Sodium: 133 mmol/L — ABNORMAL LOW (ref 135–145)

## 2019-10-05 LAB — CK: Total CK: 286 U/L — ABNORMAL HIGH (ref 38–234)

## 2019-10-05 LAB — MAGNESIUM: Magnesium: 1.3 mg/dL — ABNORMAL LOW (ref 1.7–2.4)

## 2019-10-05 MED ORDER — METOPROLOL TARTRATE 25 MG PO TABS
25.0000 mg | ORAL_TABLET | Freq: Once | ORAL | Status: AC
  Administered 2019-10-05: 25 mg via ORAL
  Filled 2019-10-05: qty 1

## 2019-10-05 MED ORDER — ENOXAPARIN SODIUM 40 MG/0.4ML ~~LOC~~ SOLN
40.0000 mg | SUBCUTANEOUS | Status: DC
  Administered 2019-10-05: 40 mg via SUBCUTANEOUS
  Filled 2019-10-05: qty 0.4

## 2019-10-05 MED ORDER — METOPROLOL TARTRATE 50 MG PO TABS
100.0000 mg | ORAL_TABLET | Freq: Two times a day (BID) | ORAL | Status: DC
  Administered 2019-10-05 – 2019-10-08 (×6): 100 mg via ORAL
  Filled 2019-10-05 (×8): qty 2

## 2019-10-05 MED ORDER — LABETALOL HCL 5 MG/ML IV SOLN: 10.0000 mg | INTRAVENOUS | Status: DC | PRN

## 2019-10-05 NOTE — Progress Notes (Signed)
Patient's IVC paperwork rescinded per Dr. Laural Benes. Paperwork faxed to [employer] office.

## 2019-10-05 NOTE — Progress Notes (Signed)
PROGRESS NOTE   Maria Pace  ZOX:[id number] DOB: 03/22/1941 DOA: 10/03/2019 PCP: Avon GullyFanta, Tesfaye, MD   No chief complaint on file.   Brief Admission History:  79 y.o. female with medical history significant of bipolar 1 disorder, schizophrenia, essential hypertension, GERD, hyperlipidemia, history of irregular heartbeat, type 2 diabetes mellitus who is brought from her assisted living facility due to becoming confused, locking herself in her room, then being found lying down on the floor with a broken bed, the TV knocked off the TV stand and the patient stating that everything in the room that had color needed to be removed by the staff because anything with color contain the devil.  The staff from the facility suspects that the patient has not been taking her medications.  She has some trouble elaborating about what has happened over the past few days.  It seems that she has not been eating or drinking properly.  She seems to be in no distress at the moment and she is able to answer simple questions and denies having headache, chest pain, dyspnea, abdominal or joint pain at this time.  Assessment & Plan:   Principal Problem:   AKI (acute kidney injury) (HCC) Active Problems:   Hypertension   Type 2 diabetes mellitus (HCC)   Hyperlipidemia   Hyponatremia   Schizophrenia (HCC)   GERD (gastroesophageal reflux disease)   Elevated CK   SVT (supraventricular tachycardia) (HCC)   ARF (acute renal failure) (HCC)  1. AKI - suspect prerenal as she admits she has not been eating and drinking well over last several days possibly due to mental breakdown, she is responding to IV fluids. Follow creatinine.  IMPROVING.  2. Hyponatremia - suspect from dehydration, continue hydration and follow.  IMPROVING.  3. Type 2 DM - continue SSI coverage, carb modified diet and CBG testing.  STABLE 4. Paroxysmal SVT - she has had some recurrence, increased metoprolol to 100 mg BID, check Mg  5. Schizophrenia -  resumed on home meds, she has been IVC'd.  Consult TTS when medically stabilized.  6. GERD - stable on protonix for GI protection.  7. Essential hypertension - BP were elevated but not coming down after restarting home meds.  8. Elevated CK - continue IV fluid hydration and follow CK level.  IMPROVING.   DVT prophylaxis: enoxaparin  Code Status: full  Family Communication: sister Bunny at bedside 10/04/19 Disposition: return to ALF versus inpatient psych admission  Status is: Inpatient  Remains inpatient appropriate because:IV treatments appropriate due to intensity of illness or inability to take PO  Dispo: The patient is from: ALF              Anticipated d/c is to: ALF              Anticipated d/c date is: 2 days              Patient currently is not medically stable to d/c.  Consultants:     Procedures:     Antimicrobials:    Subjective: Pt was sedated with lorazepam this morning and now sleeping but arousable.   Objective: Vitals:   10/05/19 0511 10/05/19 0800 10/05/19 0900 10/05/19 1000  BP:   (!) 173/99 (!) 176/152  Pulse:  93 100 (!) 213  Resp:  15 17 20   Temp:  98.2 F (36.8 C)    TempSrc:  Axillary    SpO2:  96% 98% (!) 66%  Weight: 90.1 kg     Height:  Intake/Output Summary (Last 24 hours) at 10/05/2019 1105 Last data filed at 10/05/2019 1104 Gross per 24 hour  Intake 3492.31 ml  Output 4000 ml  Net -507.69 ml   Filed Weights   10/03/19 1752 10/03/19 2324 10/05/19 0511  Weight: 88.9 kg 88.9 kg 90.1 kg    Examination:  General exam: Appears calm and comfortable, a little agitation, no active hallucination, did not verbalize delusions at this time.   Respiratory system: Clear to auscultation. Respiratory effort normal. Cardiovascular system: S1 & S2 heard, RRR. No JVD, murmurs, rubs, gallops or clicks. No pedal edema. Gastrointestinal system: Abdomen is nondistended, soft and nontender. No organomegaly or masses felt. Normal bowel sounds  heard. Central nervous system: Alert and oriented. No focal neurological deficits. Extremities: Symmetric 5 x 5 power. Skin: No rashes, lesions or ulcers Psychiatry: Judgement and insight appear normal. Mood & affect appropriate.   Data Reviewed: I have personally reviewed following labs and imaging studies  CBC: Recent Labs  Lab 10/03/19 1807 10/04/19 0448  WBC 13.1* 12.9*  HGB 10.6* 8.7*  HCT 32.8* 27.8*  MCV 85.4 87.1  PLT 298 214    Basic Metabolic Panel: Recent Labs  Lab 10/03/19 1807 10/04/19 0448 10/05/19 0430  NA 128* 130* 133*  K 3.9 3.5 3.9  CL 86* 100 102  CO2 17* 22 24  GLUCOSE 182* 139* 113*  BUN 29* 21 16  CREATININE 2.30* 1.56* 1.11*  CALCIUM 9.1 7.9* 8.5*    GFR: Estimated Creatinine Clearance: 43.1 mL/min (A) (by C-G formula based on SCr of 1.11 mg/dL (H)).  Liver Function Tests: Recent Labs  Lab 10/03/19 1807  AST 24  ALT 18  ALKPHOS 81  BILITOT 0.7  PROT 8.1  ALBUMIN 4.3  4.3    CBG: Recent Labs  Lab 10/04/19 0916 10/04/19 1113 10/04/19 1703 10/04/19 2002 10/05/19 0808  GLUCAP 170* 143* 86 172* 80    Recent Results (from the past 240 hour(s))  SARS Coronavirus 2 by RT PCR (hospital order, performed in [facility] hospital lab) Nasopharyngeal Nasopharyngeal Swab     Status: None   Collection Time: 10/03/19  8:36 PM   Specimen: Nasopharyngeal Swab  Result Value Ref Range Status   SARS Coronavirus 2 NEGATIVE NEGATIVE Final    Comment: (NOTE) SARS-CoV-2 target nucleic acids are NOT DETECTED.  The SARS-CoV-2 RNA is generally detectable in upper and lower respiratory specimens during the acute phase of infection. The lowest concentration of SARS-CoV-2 viral copies this assay can detect is 250 copies / mL. A negative result does not preclude SARS-CoV-2 infection and should not be used as the sole basis for treatment or other patient management decisions.  A negative result may occur with improper specimen collection / handling,  submission of specimen other than nasopharyngeal swab, presence of viral mutation(s) within the areas targeted by this assay, and inadequate number of viral copies (<250 copies / mL). A negative result must be combined with clinical observations, patient history, and epidemiological information.  Fact Sheet for Patients:   BoilerBrush.com.cy  Fact Sheet for Healthcare Providers: https://pope.com/  This test is not yet approved or  cleared by the [place] FDA and has been authorized for detection and/or diagnosis of SARS-CoV-2 by FDA under an Emergency Use Authorization (EUA).  This EUA will remain in effect (meaning this test can be used) for the duration of the COVID-19 declaration under Section 564(b)(1) of the Act, 21 U.S.C. section 360bbb-3(b)(1), unless the authorization is terminated or revoked sooner.  Performed  at [facility], [address] [place], [place] [address]   MRSA PCR Screening     Status: None   Collection Time: 10/03/19 11:07 PM   Specimen: Nasal Mucosa; Nasopharyngeal  Result Value Ref Range Status   MRSA by PCR NEGATIVE NEGATIVE Final    Comment:        The GeneXpert MRSA Assay (FDA approved for NASAL specimens only), is one component of a comprehensive MRSA colonization surveillance program. It is not intended to diagnose MRSA infection nor to guide or monitor treatment for MRSA infections. Performed at [facility], [address] [place], [place] [address]      Radiology Studies: DG Chest 1 View  Result Date: 10/04/2019 CLINICAL DATA:  Leukocytosis. EXAM: CHEST  1 VIEW COMPARISON:  Chest x-ray 06/13/2018. FINDINGS: Mediastinum hilar structures normal. Low lung volumes. Mild right base subsegmental atelectasis. No pleural effusion or pneumothorax. Degenerative changes scoliosis thoracic spine. IMPRESSION: Low lung volumes with mild right base subsegmental atelectasis. Electronically Signed   By:  Maisie Fus  Register   On: 10/04/2019 10:27   CT HEAD WO CONTRAST  Result Date: 10/04/2019 CLINICAL DATA:  Encephalopathy. EXAM: CT HEAD WITHOUT CONTRAST TECHNIQUE: Contiguous axial images were obtained from the base of the skull through the vertex without intravenous contrast. COMPARISON:  Head CT 10/28/2013, report from head CT 04/02/2018 (images unavailable). FINDINGS: Brain: Mild generalized parenchymal atrophy. No hyperdense vessel. No demarcated cortical infarct. No extra-axial fluid collection. No evidence of intracranial mass. No midline shift. Vascular: No hyperdense vessel. Skull: Normal. Negative for fracture or focal lesion. Sinuses/Orbits: Visualized orbits show no acute finding. Small right maxillary sinus mucous retention cyst. Mild ethmoid sinus mucosal thickening. No significant mastoid effusion. IMPRESSION: No CT evidence of acute intracranial abnormality. Mild generalized parenchymal atrophy, stable. Mild ethmoid sinus mucosal thickening. Small right maxillary sinus mucous retention cyst. Electronically Signed   By: Jackey Loge DO   On: 10/04/2019 11:32   Scheduled Meds: . Chlorhexidine Gluconate Cloth  6 each Topical Daily  . docusate sodium  200 mg Oral Daily  . enoxaparin (LOVENOX) injection  40 mg Subcutaneous Q24H  . insulin aspart  0-15 Units Subcutaneous TID WC  . latanoprost  1 drop Both Eyes QHS  . loratadine  10 mg Oral Daily  . metoprolol tartrate  100 mg Oral BID  . OLANZapine  10 mg Oral QHS  . pantoprazole  40 mg Oral Daily  . risperiDONE  1 mg Oral QHS  . senna-docusate  2 tablet Oral QHS  . traZODone  50 mg Oral QHS   Continuous Infusions: . sodium chloride 100 mL/hr at 10/05/19 0501  . cefTRIAXone (ROCEPHIN)  IV 1 g (10/05/19 0842)     LOS: 1 day   Critical Care Procedure Note Authorized and Performed by: Maryln Manuel MD  Total Critical Care time:  31 minutes  Due to a high probability of clinically significant, life threatening deterioration, the patient  required my highest level of preparedness to intervene emergently and I personally spent this critical care time directly and personally managing the patient.  This critical care time included obtaining a history; examining the patient, pulse oximetry; ordering and review of studies; arranging urgent treatment with development of a management plan; evaluation of patient's response of treatment; frequent reassessment; and discussions with other providers.  This critical care time was performed to assess and manage the high probability of imminent and life threatening deterioration that could result in multi-organ failure.  It was exclusive of separately  billable procedures and treating other patients and teaching time.    Standley Dakins, MD How to contact the [facility] Attending or Consulting provider 7A - 7P or covering provider during after hours 7P -7A, for this patient?  1. Check the care team in [facility] and look for a) attending/consulting [facility] provider listed and b) the [facility] team listed 2. Log into [email or web address] and use [facility] universal password to access. If you do not have the password, please contact the hospital operator. 3. Locate the [facility] provider you are looking for under [facility] and page to a number that you can be directly reached. 4. If you still have difficulty reaching the provider, please page the [facility] (Director on Call) for the Hospitalists listed on amion for assistance.  10/05/2019, 11:05 AM

## 2019-10-06 ENCOUNTER — Inpatient Hospital Stay (HOSPITAL_COMMUNITY): Payer: Medicare Other

## 2019-10-06 ENCOUNTER — Encounter (HOSPITAL_COMMUNITY): Payer: Self-pay | Admitting: Family Medicine

## 2019-10-06 DIAGNOSIS — I361 Nonrheumatic tricuspid (valve) insufficiency: Secondary | ICD-10-CM

## 2019-10-06 DIAGNOSIS — I340 Nonrheumatic mitral (valve) insufficiency: Secondary | ICD-10-CM

## 2019-10-06 LAB — BASIC METABOLIC PANEL
Anion gap: 8 (ref 5–15)
BUN: 9 mg/dL (ref 8–23)
CO2: 24 mmol/L (ref 22–32)
Calcium: 8.7 mg/dL — ABNORMAL LOW (ref 8.9–10.3)
Chloride: 101 mmol/L (ref 98–111)
Creatinine, Ser: 0.92 mg/dL (ref 0.44–1.00)
GFR calc Af Amer: >60 mL/min (ref >60)
GFR calc non Af Amer: >60 mL/min (ref >60)
Glucose, Bld: 126 mg/dL — ABNORMAL HIGH (ref 70–99)
Potassium: 3.9 mmol/L (ref 3.5–5.1)
Sodium: 133 mmol/L — ABNORMAL LOW (ref 135–145)

## 2019-10-06 LAB — CBC
HCT: 29.8 % — ABNORMAL LOW (ref 36.0–46.0)
Hemoglobin: 9.4 g/dL — ABNORMAL LOW (ref 12.0–15.0)
MCH: 27.9 pg (ref 26.0–34.0)
MCHC: 31.5 g/dL (ref 30.0–36.0)
MCV: 88.4 fL (ref 80.0–100.0)
Platelets: 254 10*3/uL (ref 150–400)
RBC: 3.37 MIL/uL — ABNORMAL LOW (ref 3.87–5.11)
RDW: 14.5 % (ref 11.5–15.5)
WBC: 8.9 10*3/uL (ref 4.0–10.5)
nRBC: 0 % (ref 0.0–0.2)

## 2019-10-06 LAB — LIPID PANEL
Cholesterol: 156 mg/dL (ref 0–200)
HDL: 45 mg/dL (ref >40)
LDL Cholesterol: 99 mg/dL (ref 0–99)
Total CHOL/HDL Ratio: 3.5 RATIO
Triglycerides: 59 mg/dL (ref <150)
VLDL: 12 mg/dL (ref 0–40)

## 2019-10-06 LAB — ECHOCARDIOGRAM COMPLETE
Height: 60 in
Weight: 3209.9 oz

## 2019-10-06 LAB — TSH: TSH: 1.481 u[IU]/mL (ref 0.350–4.500)

## 2019-10-06 LAB — MAGNESIUM: Magnesium: 1.3 mg/dL — ABNORMAL LOW (ref 1.7–2.4)

## 2019-10-06 LAB — GLUCOSE, CAPILLARY
Glucose-Capillary: 133 mg/dL — ABNORMAL HIGH (ref 70–99)
Glucose-Capillary: 274 mg/dL — ABNORMAL HIGH (ref 70–99)
Glucose-Capillary: 131 mg/dL — ABNORMAL HIGH (ref 70–99)
Glucose-Capillary: 161 mg/dL — ABNORMAL HIGH (ref 70–99)

## 2019-10-06 LAB — VITAMIN D 25 HYDROXY (VIT D DEFICIENCY, FRACTURES): Vit D, 25-Hydroxy: 16.48 ng/mL — ABNORMAL LOW (ref 30–100)

## 2019-10-06 LAB — T4, FREE: Free T4: 1.45 ng/dL — ABNORMAL HIGH (ref 0.61–1.12)

## 2019-10-06 MED ORDER — MAGNESIUM SULFATE 2 GM/50ML IV SOLN
2.0000 g | Freq: Once | INTRAVENOUS | Status: AC
  Administered 2019-10-06: 2 g via INTRAVENOUS
  Filled 2019-10-06: qty 50

## 2019-10-06 MED ORDER — METOPROLOL TARTRATE 5 MG/5ML IV SOLN
INTRAVENOUS | Status: AC
  Administered 2019-10-06: 5 mg via INTRAVENOUS
  Filled 2019-10-06: qty 5

## 2019-10-06 MED ORDER — METOPROLOL TARTRATE 5 MG/5ML IV SOLN: 5.0000 mg | Freq: Once | INTRAVENOUS | Status: AC

## 2019-10-06 MED ORDER — APIXABAN 5 MG PO TABS
5.0000 mg | ORAL_TABLET | Freq: Two times a day (BID) | ORAL | Status: DC
  Administered 2019-10-06 – 2019-10-11 (×10): 5 mg via ORAL
  Filled 2019-10-06 (×11): qty 1

## 2019-10-06 MED ORDER — DILTIAZEM HCL 30 MG PO TABS
30.0000 mg | ORAL_TABLET | Freq: Four times a day (QID) | ORAL | Status: AC
  Administered 2019-10-06 – 2019-10-08 (×7): 30 mg via ORAL
  Filled 2019-10-06 (×7): qty 1

## 2019-10-06 NOTE — Progress Notes (Signed)
Dr. Robb Matar notified HR sustaining 140s, B/P 169/111 MAP 130, HR 149, RR 19. Spo2 97 room air.

## 2019-10-06 NOTE — Progress Notes (Signed)
*  PRELIMINARY RESULTS* Echocardiogram 2D Echocardiogram has been performed.  Stacey Drain 10/06/2019, 2:07 PM

## 2019-10-06 NOTE — Progress Notes (Signed)
Dr. Robb Matar notified patient has had several non-sustained bursts of atrial fib, rvr during the night. Primary rhythm has been NSR. Vital signs have remained stable otherwise.

## 2019-10-06 NOTE — Progress Notes (Signed)
ANTICOAGULATION CONSULT NOTE - Follow Up Consult  Pharmacy Consult for apixaban Indication: atrial fibrillation  Allergies  Allergen Reactions  . Haloperidol Lactate Other (See Comments)    Couldn't urinate anymore    Patient Measurements: Height: 5' (152.4 cm) Weight: 91 kg (200 lb 9.9 oz) IBW/kg (Calculated) : 45.5  Vital Signs: Temp: 98.3 F (36.8 C) (07/11 0400) Temp Source: Oral (07/11 0400) BP: 110/88 (07/11 0700) Pulse Rate: 33 (07/11 0700)  Labs: Recent Labs    10/03/19 1807 10/03/19 1807 10/04/19 0448 10/05/19 0430 10/06/19 0534  HGB 10.6*   < > 8.7*  --  9.4*  HCT 32.8*  --  27.8*  --  29.8*  PLT 298  --  214  --  254  CREATININE 2.30*   < > 1.56* 1.11* 0.92  CKTOTAL 381*  --  390* 286*  --    < > = values in this interval not displayed.    Estimated Creatinine Clearance: 52.3 mL/min (by C-G formula based on SCr of 0.92 mg/dL).   Medications:  Medications Prior to Admission  Medication Sig Dispense Refill Last Dose  . acetaminophen (TYLENOL) 500 MG tablet Take 500 mg by mouth every 8 (eight) hours as needed for mild pain or moderate pain.     Marland Kitchen atorvastatin (LIPITOR) 10 MG tablet Take 10 mg by mouth daily.   10/03/2019 at 0800  . Camphor-Eucalyptus-Menthol (VICKS VAPORUB) 4.7-1.2-2.6 % OINT Apply 1 application topically daily. Apply once to toenails daily     . docusate sodium (COLACE) 100 MG capsule Take 200 mg by mouth daily.   10/03/2019 at 0800  . hydrochlorothiazide (MICROZIDE) 12.5 MG capsule Take 12.5 mg by mouth daily.   10/03/2019 at 0800  . latanoprost (XALATAN) 0.005 % ophthalmic solution Place 1 drop into both eyes at bedtime.   10/02/2019 at Unknown time  . linagliptin (TRADJENTA) 5 MG TABS tablet Take 5 mg by mouth daily.   10/03/2019 at 0800  . lisinopril (PRINIVIL,ZESTRIL) 20 MG tablet Take 20 mg by mouth daily.   10/03/2019 at 0800  . loratadine (CLARITIN) 10 MG tablet Take 10 mg by mouth daily.   10/03/2019 at 0800  . metFORMIN (GLUCOPHAGE) 500 MG  tablet Take 500 mg by mouth 2 (two) times daily with a meal.   10/03/2019 at 0800  . metoprolol tartrate (LOPRESSOR) 50 MG tablet Take 1.5 tablets (75 mg total) by mouth 2 (two) times daily. 270 tablet 3 10/03/2019 at 0800  . OLANZapine (ZYPREXA) 10 MG tablet Take 1 tablet (10 mg total) by mouth at bedtime. 90 tablet 0 10/02/2019 at Unknown time  . pantoprazole (PROTONIX) 40 MG tablet Take 40 mg by mouth daily.   10/03/2019 at 0800  . polyethylene glycol powder (GLYCOLAX/MIRALAX) powder Take 17 g by mouth daily as needed for mild constipation or moderate constipation.     . risperiDONE (RISPERDAL) 1 MG tablet Take 1 mg by mouth at bedtime.   10/02/2019 at Unknown time  . senna-docusate (SENOKOT-S) 8.6-50 MG tablet Take 2 tablets by mouth at bedtime. 60 tablet 0 10/02/2019 at Unknown time  . traZODone (DESYREL) 50 MG tablet Take 1 tablet (50 mg total) by mouth at bedtime. 90 tablet 0 10/02/2019 at Unknown time  . bisacodyl (DULCOLAX) 10 MG suppository Place 1 suppository (10 mg total) rectally as needed for moderate constipation. (Patient not taking: Reported on 10/03/2019) 12 suppository 0 Not Taking at Unknown time   Scheduled:  . apixaban  5 mg Oral BID  .  Chlorhexidine Gluconate Cloth  6 each Topical Daily  . diltiazem  30 mg Oral Q6H  . docusate sodium  200 mg Oral Daily  . insulin aspart  0-15 Units Subcutaneous TID WC  . latanoprost  1 drop Both Eyes QHS  . loratadine  10 mg Oral Daily  . metoprolol tartrate  100 mg Oral BID  . OLANZapine  10 mg Oral QHS  . pantoprazole  40 mg Oral Daily  . risperiDONE  1 mg Oral QHS  . senna-docusate  2 tablet Oral QHS  . traZODone  50 mg Oral QHS   Infusions:  . sodium chloride 10 mL/hr at 10/06/19 0737  . magnesium sulfate bolus IVPB 2 g (10/06/19 1037)   PRN: acetaminophen **OR** acetaminophen, bisacodyl, labetalol, LORazepam, ondansetron **OR** ondansetron (ZOFRAN) IV, polyethylene glycol Anti-infectives (From admission, onward)   Start     Dose/Rate Route  Frequency Ordered Stop   10/04/19 0800  cefTRIAXone (ROCEPHIN) 1 g in sodium chloride 0.9 % 100 mL IVPB        1 g 200 mL/hr over 30 Minutes Intravenous Every 24 hours 10/04/19 0650 10/06/19 0300      Assessment: 79 year old female with nonvalvular afib requiring anticoagulation with apixaban.   Goal of Therapy:  apixaban anticoagulation Monitor platelets by anticoagulation protocol: Yes   Plan:  Stop lovenox Start apixaban 5mg  po bid  CBC MWF Monitor for signs and symptoms of bleeding   Maria Pace 10/06/2019,10:45 AM

## 2019-10-06 NOTE — Progress Notes (Signed)
[facility] night shift.  I was notified  by the staff that the patient is having atrial fibrillation with RVR with her heart rate sustaining in the 140s, B/P 169/111 MAP 130, HR 149, RR 19. Spo2 97 room air.  No chest pain or dyspnea. The patient is hypomagnesemic. Her oral metoprolol was resumed last night. I am not certain about one was the previous dose before this. This may be due to electrolyte abnormalities along with recent metoprolol temporary discontinuation. Magnesium sulfate 2 g IVPB and metoprolol 5 mg IVP ordered. Will try IV diltiazem if no improvement.  Sanda Klein, MD

## 2019-10-06 NOTE — Progress Notes (Addendum)
PROGRESS NOTE   Maria Pace  PRF:[id number] DOB: Jul 09, 1940 DOA: 10/03/2019 PCP: Avon Gully, MD   No chief complaint on file.  Brief Admission History:  79 y.o. female with medical history significant of bipolar 1 disorder, schizophrenia, essential hypertension, GERD, hyperlipidemia, history of irregular heartbeat, type 2 diabetes mellitus who is brought from her assisted living facility due to becoming confused, locking herself in her room, then being found lying down on the floor with a broken bed, the TV knocked off the TV stand and the patient stating that everything in the room that had color needed to be removed by the staff because anything with color contain the devil.  The staff from the facility suspects that the patient has not been taking her medications.  She has some trouble elaborating about what has happened over the past few days.  It seems that she has not been eating or drinking properly.  She seems to be in no distress at the moment and she is able to answer simple questions and denies having headache, chest pain, dyspnea, abdominal or joint pain at this time.  Assessment & Plan:   Principal Problem:   AKI (acute kidney injury) (HCC) Active Problems:   Hypertension   Type 2 diabetes mellitus (HCC)   Hyperlipidemia   Hyponatremia   Schizophrenia (HCC)   GERD (gastroesophageal reflux disease)   Elevated CK   SVT (supraventricular tachycardia) (HCC)   ARF (acute renal failure) (HCC)  1. AKI - suspect prerenal as she admits she has not been eating and drinking well over last several days possibly due to mental breakdown, she is responding to IV fluids. Follow creatinine.  IMPROVING.  2. Hyponatremia - suspect from dehydration, continue hydration and follow.  IMPROVING.  3. Type 2 DM - continue SSI coverage, carb modified diet and CBG testing.  STABLE 4. Paroxysmal Atrial Fibrillation with RVR - seen overnight, responded well to IV lopressor, continue metoprolol to 100  mg BID, replace Mg, will counsel with patient about starting apixaban and consult pharmacy.  Check Echo, check TSH.  5. Schizophrenia - resumed on home meds.  IVC was discontinued 7/10.  Consult TTS when medically stabilized hopefully 7/12.   6. Hypomagnesemia - 4 gm IV ordered 7/11, recheck in AM.   7. GERD - stable on protonix for GI protection.  8. Essential hypertension - BP were elevated but not coming down after restarting home meds.  9. Elevated CK - continue IV fluid hydration and follow CK level.  IMPROVING.   DVT prophylaxis: enoxaparin  Code Status: full  Family Communication: sister Bunny at bedside 10/04/19, 10/06/10  Disposition: return to ALF versus inpatient psych admission  Status is: Inpatient  Remains inpatient appropriate because:IV treatments appropriate due to intensity of illness or inability to take PO.  Now dealing with Afib with RVR. Titrating metoprolol and starting anticoagulation.    Dispo: The patient is from: ALF              Anticipated d/c is to: ALF              Anticipated d/c date is: 2 days              Patient currently is not medically stable to d/c.  Consultants:     Procedures:     Antimicrobials:   Subjective: Pt was sedated with lorazepam this morning and now sleeping but arousable.   Objective: Vitals:   10/06/19 9357 10/06/19 0631 10/06/19 0659 10/06/19 0700  BP:   (!) 152/86 110/88  Pulse: (!) 144 (!) 142 (!) 120 (!) 33  Resp: (!) 25 19 18  (!) 25  Temp:      TempSrc:      SpO2: 98% 100% 90% 100%  Weight:      Height:        Intake/Output Summary (Last 24 hours) at 10/06/2019 1007 Last data filed at 10/06/2019 0500 Gross per 24 hour  Intake 1505.48 ml  Output 3300 ml  Net -1794.52 ml   Filed Weights   10/03/19 2324 10/05/19 0511 10/06/19 0500  Weight: 88.9 kg 90.1 kg 91 kg    Examination:  General exam: Appears calm and comfortable, a little agitation, no active hallucination, did not verbalize delusions at this  time.   Respiratory system: Clear to auscultation. Respiratory effort normal. Cardiovascular system: S1 & S2 heard, RRR. No JVD, murmurs, rubs, gallops or clicks. No pedal edema. Gastrointestinal system: Abdomen is nondistended, soft and nontender. No organomegaly or masses felt. Normal bowel sounds heard. Central nervous system: Alert and oriented. No focal neurological deficits. Extremities: Symmetric 5 x 5 power. Skin: No rashes, lesions or ulcers Psychiatry: Judgement and insight appear normal. Mood & affect appropriate.   Data Reviewed: I have personally reviewed following labs and imaging studies  CBC: Recent Labs  Lab 10/03/19 1807 10/04/19 0448 10/06/19 0534  WBC 13.1* 12.9* 8.9  HGB 10.6* 8.7* 9.4*  HCT 32.8* 27.8* 29.8*  MCV 85.4 87.1 88.4  PLT 298 214 254    Basic Metabolic Panel: Recent Labs  Lab 10/03/19 1807 10/04/19 0448 10/05/19 0430 10/06/19 0534  NA 128* 130* 133* 133*  K 3.9 3.5 3.9 3.9  CL 86* 100 102 101  CO2 17* 22 24 24   GLUCOSE 182* 139* 113* 126*  BUN 29* 21 16 9   CREATININE 2.30* 1.56* 1.11* 0.92  CALCIUM 9.1 7.9* 8.5* 8.7*  MG  --   --  1.3* 1.3*    GFR: Estimated Creatinine Clearance: 52.3 mL/min (by C-G formula based on SCr of 0.92 mg/dL).  Liver Function Tests: Recent Labs  Lab 10/03/19 1807  AST 24  ALT 18  ALKPHOS 81  BILITOT 0.7  PROT 8.1  ALBUMIN 4.3  4.3    CBG: Recent Labs  Lab 10/05/19 0808 10/05/19 1128 10/05/19 1645 10/05/19 2103 10/06/19 0753  GLUCAP 80 189* 74 89 133*    Recent Results (from the past 240 hour(s))  SARS Coronavirus 2 by RT PCR (hospital order, performed in [facility] hospital lab) Nasopharyngeal Nasopharyngeal Swab     Status: None   Collection Time: 10/03/19  8:36 PM   Specimen: Nasopharyngeal Swab  Result Value Ref Range Status   SARS Coronavirus 2 NEGATIVE NEGATIVE Final    Comment: (NOTE) SARS-CoV-2 target nucleic acids are NOT DETECTED.  The SARS-CoV-2 RNA is generally  detectable in upper and lower respiratory specimens during the acute phase of infection. The lowest concentration of SARS-CoV-2 viral copies this assay can detect is 250 copies / mL. A negative result does not preclude SARS-CoV-2 infection and should not be used as the sole basis for treatment or other patient management decisions.  A negative result may occur with improper specimen collection / handling, submission of specimen other than nasopharyngeal swab, presence of viral mutation(s) within the areas targeted by this assay, and inadequate number of viral copies (<250 copies / mL). A negative result must be combined with clinical observations, patient history, and epidemiological information.  Fact Sheet for Patients:  BoilerBrush.com.cy  Fact Sheet for Healthcare Providers: https://pope.com/  This test is not yet approved or  cleared by the [place] FDA and has been authorized for detection and/or diagnosis of SARS-CoV-2 by FDA under an Emergency Use Authorization (EUA).  This EUA will remain in effect (meaning this test can be used) for the duration of the COVID-19 declaration under Section 564(b)(1) of the Act, 21 U.S.C. section 360bbb-3(b)(1), unless the authorization is terminated or revoked sooner.  Performed at [facility], [address] [place], [place] [address]   MRSA PCR Screening     Status: None   Collection Time: 10/03/19 11:07 PM   Specimen: Nasal Mucosa; Nasopharyngeal  Result Value Ref Range Status   MRSA by PCR NEGATIVE NEGATIVE Final    Comment:        The GeneXpert MRSA Assay (FDA approved for NASAL specimens only), is one component of a comprehensive MRSA colonization surveillance program. It is not intended to diagnose MRSA infection nor to guide or monitor treatment for MRSA infections. Performed at [facility], [address], [place], [place] [address]      Radiology Studies: DG Chest 1  View  Result Date: 10/04/2019 CLINICAL DATA:  Leukocytosis. EXAM: CHEST  1 VIEW COMPARISON:  Chest x-ray 06/13/2018. FINDINGS: Mediastinum hilar structures normal. Low lung volumes. Mild right base subsegmental atelectasis. No pleural effusion or pneumothorax. Degenerative changes scoliosis thoracic spine. IMPRESSION: Low lung volumes with mild right base subsegmental atelectasis. Electronically Signed   By: Maisie Fus  Register   On: 10/04/2019 10:27   CT HEAD WO CONTRAST  Result Date: 10/04/2019 CLINICAL DATA:  Encephalopathy. EXAM: CT HEAD WITHOUT CONTRAST TECHNIQUE: Contiguous axial images were obtained from the base of the skull through the vertex without intravenous contrast. COMPARISON:  Head CT 10/28/2013, report from head CT 04/02/2018 (images unavailable). FINDINGS: Brain: Mild generalized parenchymal atrophy. No hyperdense vessel. No demarcated cortical infarct. No extra-axial fluid collection. No evidence of intracranial mass. No midline shift. Vascular: No hyperdense vessel. Skull: Normal. Negative for fracture or focal lesion. Sinuses/Orbits: Visualized orbits show no acute finding. Small right maxillary sinus mucous retention cyst. Mild ethmoid sinus mucosal thickening. No significant mastoid effusion. IMPRESSION: No CT evidence of acute intracranial abnormality. Mild generalized parenchymal atrophy, stable. Mild ethmoid sinus mucosal thickening. Small right maxillary sinus mucous retention cyst. Electronically Signed   By: Jackey Loge DO   On: 10/04/2019 11:32   Scheduled Meds: . Chlorhexidine Gluconate Cloth  6 each Topical Daily  . docusate sodium  200 mg Oral Daily  . enoxaparin (LOVENOX) injection  40 mg Subcutaneous Q24H  . insulin aspart  0-15 Units Subcutaneous TID WC  . latanoprost  1 drop Both Eyes QHS  . loratadine  10 mg Oral Daily  . metoprolol tartrate  100 mg Oral BID  . OLANZapine  10 mg Oral QHS  . pantoprazole  40 mg Oral Daily  . risperiDONE  1 mg Oral QHS  .  senna-docusate  2 tablet Oral QHS  . traZODone  50 mg Oral QHS   Continuous Infusions: . sodium chloride 10 mL/hr at 10/06/19 0737  . magnesium sulfate bolus IVPB       LOS: 2 days   Critical Care Procedure Note Authorized and Performed by: Maryln Manuel MD  Total Critical Care time:  31 minutes  Due to a high probability of clinically significant, life threatening deterioration, the patient required my highest level of preparedness to intervene emergently and I personally spent this critical care time  directly and personally managing the patient.  This critical care time included obtaining a history; examining the patient, pulse oximetry; ordering and review of studies; arranging urgent treatment with development of a management plan; evaluation of patient's response of treatment; frequent reassessment; and discussions with other providers.  This critical care time was performed to assess and manage the high probability of imminent and life threatening deterioration that could result in multi-organ failure.  It was exclusive of separately billable procedures and treating other patients and teaching time.    Standley Dakins, MD How to contact the [facility] Attending or Consulting provider 7A - 7P or covering provider during after hours 7P -7A, for this patient?  1. Check the care team in [facility] and look for a) attending/consulting [facility] provider listed and b) the [facility] team listed 2. Log into [email or web address] and use [place]'s universal password to access. If you do not have the password, please contact the hospital operator. 3. Locate the [facility] provider you are looking for under [facility] and page to a number that you can be directly reached. 4. If you still have difficulty reaching the provider, please page the [facility] (Director on Call) for the Hospitalists listed on amion for assistance.  10/06/2019, 10:07 AM

## 2019-10-07 LAB — BASIC METABOLIC PANEL
Anion gap: 9 (ref 5–15)
BUN: 9 mg/dL (ref 8–23)
CO2: 24 mmol/L (ref 22–32)
Calcium: 9.3 mg/dL (ref 8.9–10.3)
Chloride: 101 mmol/L (ref 98–111)
Creatinine, Ser: 0.91 mg/dL (ref 0.44–1.00)
GFR calc Af Amer: >60 mL/min (ref >60)
GFR calc non Af Amer: >60 mL/min (ref >60)
Glucose, Bld: 138 mg/dL — ABNORMAL HIGH (ref 70–99)
Potassium: 4 mmol/L (ref 3.5–5.1)
Sodium: 134 mmol/L — ABNORMAL LOW (ref 135–145)

## 2019-10-07 LAB — CBC
HCT: 30.3 % — ABNORMAL LOW (ref 36.0–46.0)
Hemoglobin: 9.8 g/dL — ABNORMAL LOW (ref 12.0–15.0)
MCH: 28 pg (ref 26.0–34.0)
MCHC: 32.3 g/dL (ref 30.0–36.0)
MCV: 86.6 fL (ref 80.0–100.0)
Platelets: 274 10*3/uL (ref 150–400)
RBC: 3.5 MIL/uL — ABNORMAL LOW (ref 3.87–5.11)
RDW: 14.1 % (ref 11.5–15.5)
WBC: 10.8 10*3/uL — ABNORMAL HIGH (ref 4.0–10.5)
nRBC: 0 % (ref 0.0–0.2)

## 2019-10-07 LAB — MAGNESIUM: Magnesium: 1.8 mg/dL (ref 1.7–2.4)

## 2019-10-07 LAB — GLUCOSE, CAPILLARY
Glucose-Capillary: 124 mg/dL — ABNORMAL HIGH (ref 70–99)
Glucose-Capillary: 209 mg/dL — ABNORMAL HIGH (ref 70–99)
Glucose-Capillary: 139 mg/dL — ABNORMAL HIGH (ref 70–99)
Glucose-Capillary: 149 mg/dL — ABNORMAL HIGH (ref 70–99)

## 2019-10-07 MED ORDER — SPIRONOLACTONE 12.5 MG HALF TABLET
12.5000 mg | ORAL_TABLET | Freq: Every day | ORAL | Status: DC
  Filled 2019-10-07 (×4): qty 1

## 2019-10-07 MED ORDER — DILTIAZEM HCL ER COATED BEADS 120 MG PO CP24
120.0000 mg | ORAL_CAPSULE | Freq: Every day | ORAL | Status: DC
  Filled 2019-10-07 (×2): qty 1

## 2019-10-07 MED ORDER — LOSARTAN POTASSIUM 50 MG PO TABS
25.0000 mg | ORAL_TABLET | Freq: Every day | ORAL | Status: DC
  Administered 2019-10-07: 25 mg via ORAL
  Filled 2019-10-07 (×3): qty 1

## 2019-10-07 MED ORDER — VITAMIN D (ERGOCALCIFEROL) 1.25 MG (50000 UNIT) PO CAPS
50000.0000 [IU] | ORAL_CAPSULE | ORAL | Status: DC
  Administered 2019-10-07 – 2019-10-10 (×4): 50000 [IU] via ORAL
  Filled 2019-10-07 (×5): qty 1

## 2019-10-07 NOTE — Progress Notes (Signed)
PROGRESS NOTE   Maria Pace  MWN:[id number] DOB: 1940-09-26 DOA: 10/03/2019 PCP: Avon Gully, MD   No chief complaint on file.  Brief Admission History:  78 y.o. female with medical history significant of bipolar 1 disorder, schizophrenia, essential hypertension, GERD, hyperlipidemia, history of irregular heartbeat, type 2 diabetes mellitus who is brought from her assisted living facility due to becoming confused, locking herself in her room, then being found lying down on the floor with a broken bed, the TV knocked off the TV stand and the patient stating that everything in the room that had color needed to be removed by the staff because anything with color contain the devil.  The staff from the facility suspects that the patient has not been taking her medications.  She has some trouble elaborating about what has happened over the past few days.  It seems that she has not been eating or drinking properly.  She seems to be in no distress at the moment and she is able to answer simple questions and denies having headache, chest pain, dyspnea, abdominal or joint pain at this time.  Assessment & Plan:   Principal Problem:   AKI (acute kidney injury) (HCC) Active Problems:   Hypertension   Type 2 diabetes mellitus (HCC)   Hyperlipidemia   Hyponatremia   Schizophrenia (HCC)   GERD (gastroesophageal reflux disease)   Elevated CK   SVT (supraventricular tachycardia) (HCC)   ARF (acute renal failure) (HCC)  1. AKI - RESOLVED.  Suspect was prerenal as she admits she has not been eating and drinking well over last several days possibly due to mental breakdown, she is responding to IV fluids. Follow creatinine.  IMPROVING.  2. Hyponatremia - suspect from dehydration, continue hydration and follow.  IMPROVED.  3. Type 2 DM - continue SSI coverage, carb modified diet and CBG testing.  STABLE 4. Paroxysmal Atrial Fibrillation with RVR -  Better controlled with metoprolol to 100 mg BID, and  cardizem, replace Mg, started apixaban for anticoagulation, monitor for bleeding complications.   Echo, check TSH.  5. Schizophrenia - resumed on home meds.  IVC was discontinued 7/10.  Consult TTS 7/12.  PT IS MEDICALLY STABLE.  6. Hypomagnesemia -REPLETED.   7. GERD - stable on protonix for GI protection.  8. Vitamin D deficiency - added Drisdol 50,000 IU caps weekly.  9. Essential hypertension - BP poorly controlled, add losartan.  10. Elevated CK - continue IV fluid hydration and follow CK level.  RESOLVED.  11. Chronic combined systolic and diastolic heart failure - Echo reveals EF 30-35%.  Her volume status appears normal at this time. Added losartan, she is on metoprolol.  Add aldactone.  She will need outpatient cardiology follow up.    ECHOCARDIOGRAM IMPRESSIONS  1. Left ventricular ejection fraction, by estimation, is 30 to 35%. The  left ventricle has moderately decreased function. The left ventricle  demonstrates global hypokinesis. The left ventricular internal cavity size  was mildly dilated. Left ventricular  diastolic parameters are consistent with Grade II diastolic dysfunction  (pseudonormalization). Elevated left atrial pressure.  2. Right ventricular systolic function is normal. The right ventricular  size is normal. There is normal pulmonary artery systolic pressure. The  estimated right ventricular systolic pressure is 24.0 mmHg.  3. Left atrial size was mildly dilated.  4. The mitral valve is grossly normal. Mild to moderate mitral valve  regurgitation. No evidence of mitral stenosis.  5. The aortic valve is tricuspid. Aortic valve regurgitation is trivial.  No aortic stenosis is present.  6. The inferior vena cava is normal in size with greater than 50%  respiratory variability, suggesting right atrial pressure of 3 mmHg.    DVT prophylaxis: enoxaparin  Code Status: full  Family Communication: sister Bunny at bedside 10/04/19, 10/06/10  Disposition: return  to ALF versus inpatient psych admission  Status is: Inpatient  Remains inpatient appropriate because:IV treatments appropriate due to intensity of illness or inability to take PO.  Now dealing with Afib with RVR. Titrating metoprolol and starting anticoagulation.    Dispo: The patient is from: ALF              Anticipated d/c is to: ALF              Anticipated d/c date is: 1 days              Patient currently is medically stable to d/c.  Consultants:     Procedures:     Antimicrobials:   Subjective: Pt was sedated with lorazepam this morning and now sleeping but arousable.   Objective: Vitals:   10/07/19 0900 10/07/19 0907 10/07/19 1159 10/07/19 1200  BP: (!) 177/93 (!) 173/87  135/75  Pulse: 87 80    Resp: 20     Temp:   97.7 F (36.5 C)   TempSrc:   Oral   SpO2: 95%     Weight:      Height:        Intake/Output Summary (Last 24 hours) at 10/07/2019 1309 Last data filed at 10/07/2019 1058 Gross per 24 hour  Intake 325 ml  Output 3400 ml  Net -3075 ml   Filed Weights   10/05/19 0511 10/06/19 0500 10/07/19 0530  Weight: 90.1 kg 91 kg 88.3 kg    Examination:  General exam: Appears calm and comfortable, a little agitation, no active hallucination, did not verbalize delusions at this time.   Respiratory system: Clear to auscultation. Respiratory effort normal. Cardiovascular system: S1 & S2 heard, RRR. No JVD, murmurs, rubs, gallops or clicks. No pedal edema. Gastrointestinal system: Abdomen is nondistended, soft and nontender. No organomegaly or masses felt. Normal bowel sounds heard. Central nervous system: Alert and oriented. No focal neurological deficits. Extremities: Symmetric 5 x 5 power. Skin: No rashes, lesions or ulcers Psychiatry: Judgement and insight appear normal. Mood & affect appropriate.   Data Reviewed: I have personally reviewed following labs and imaging studies  CBC: Recent Labs  Lab 10/03/19 1807 10/04/19 0448 10/06/19 0534  10/07/19 0423  WBC 13.1* 12.9* 8.9 10.8*  HGB 10.6* 8.7* 9.4* 9.8*  HCT 32.8* 27.8* 29.8* 30.3*  MCV 85.4 87.1 88.4 86.6  PLT 298 214 254 274    Basic Metabolic Panel: Recent Labs  Lab 10/03/19 1807 10/04/19 0448 10/05/19 0430 10/06/19 0534 10/07/19 0423  NA 128* 130* 133* 133* 134*  K 3.9 3.5 3.9 3.9 4.0  CL 86* 100 102 101 101  CO2 17* GLUCOSE 182* 139* 113* 126* 138*  BUN 29* CREATININE 2.30* 1.56* 1.11* 0.92 0.91  CALCIUM 9.1 7.9* 8.5* 8.7* 9.3  MG  --   --  1.3* 1.3* 1.8    GFR: Estimated Creatinine Clearance: 52 mL/min (by C-G formula based on SCr of 0.91 mg/dL).  Liver Function Tests: Recent Labs  Lab 10/03/19 1807  AST 24  ALT 18  ALKPHOS 81  BILITOT 0.7  PROT 8.1  ALBUMIN 4.3  4.3  CBG: Recent Labs  Lab 10/06/19 1159 10/06/19 1639 10/06/19 2112 10/07/19 0804 10/07/19 1137  GLUCAP 274* 131* 161* 124* 209*    Recent Results (from the past 240 hour(s))  SARS Coronavirus 2 by RT PCR (hospital order, performed in [facility] hospital lab) Nasopharyngeal Nasopharyngeal Swab     Status: None   Collection Time: 10/03/19  8:36 PM   Specimen: Nasopharyngeal Swab  Result Value Ref Range Status   SARS Coronavirus 2 NEGATIVE NEGATIVE Final    Comment: (NOTE) SARS-CoV-2 target nucleic acids are NOT DETECTED.  The SARS-CoV-2 RNA is generally detectable in upper and lower respiratory specimens during the acute phase of infection. The lowest concentration of SARS-CoV-2 viral copies this assay can detect is 250 copies / mL. A negative result does not preclude SARS-CoV-2 infection and should not be used as the sole basis for treatment or other patient management decisions.  A negative result may occur with improper specimen collection / handling, submission of specimen other than nasopharyngeal swab, presence of viral mutation(s) within the areas targeted by this assay, and inadequate number of viral copies (<250 copies / mL). A  negative result must be combined with clinical observations, patient history, and epidemiological information.  Fact Sheet for Patients:   BoilerBrush.com.cy  Fact Sheet for Healthcare Providers: https://pope.com/  This test is not yet approved or  cleared by the [place] FDA and has been authorized for detection and/or diagnosis of SARS-CoV-2 by FDA under an Emergency Use Authorization (EUA).  This EUA will remain in effect (meaning this test can be used) for the duration of the COVID-19 declaration under Section 564(b)(1) of the Act, 21 U.S.C. section 360bbb-3(b)(1), unless the authorization is terminated or revoked sooner.  Performed at [facility], [address], [place], [place] [address]   MRSA PCR Screening     Status: None   Collection Time: 10/03/19 11:07 PM   Specimen: Nasal Mucosa; Nasopharyngeal  Result Value Ref Range Status   MRSA by PCR NEGATIVE NEGATIVE Final    Comment:        The GeneXpert MRSA Assay (FDA approved for NASAL specimens only), is one component of a comprehensive MRSA colonization surveillance program. It is not intended to diagnose MRSA infection nor to guide or monitor treatment for MRSA infections. Performed at [facility], [address], [place], [place] [address]      Radiology Studies: ECHOCARDIOGRAM COMPLETE  Result Date: 10/06/2019    ECHOCARDIOGRAM REPORT   Patient Name:   [name] Date of Exam: 10/06/2019 Medical Rec #:  [id number]      Height:       60.0 in Accession #:    [id number]     Weight:       200.6 lb Date of Birth:  Dec 26, 1940       BSA:          1.869 m Patient Age:    78 years       BP:           110/88 mmHg Patient Gender: F              HR:           70 bpm. [facility] Procedure: 2D Echo, Cardiac Doppler and Color Doppler                            MODIFIED REPORT: This report was modified by Lennie Odor  MD on 10/06/2019 due to rvsp.  Indications:      Atrial Fibrillation 427.31 / I48.91  History:         Patient has no prior history of Echocardiogram examinations.                  Risk Factors:Hypertension, Diabetes and Dyslipidemia.                  Schizophrenia,Irregular heart beat (From Hx),GERD.  Sonographer:     Celesta GentileBernard White RCS Referring Phys:  [id number] Cleora FleetLANFORD L Alysah Carton Diagnosing Phys: Lennie OdorWesley O'Neal MD IMPRESSIONS  1. Left ventricular ejection fraction, by estimation, is 30 to 35%. The left ventricle has moderately decreased function. The left ventricle demonstrates global hypokinesis. The left ventricular internal cavity size was mildly dilated. Left ventricular diastolic parameters are consistent with Grade II diastolic dysfunction (pseudonormalization). Elevated left atrial pressure.  2. Right ventricular systolic function is normal. The right ventricular size is normal. There is normal pulmonary artery systolic pressure. The estimated right ventricular systolic pressure is 24.0 mmHg.  3. Left atrial size was mildly dilated.  4. The mitral valve is grossly normal. Mild to moderate mitral valve regurgitation. No evidence of mitral stenosis.  5. The aortic valve is tricuspid. Aortic valve regurgitation is trivial. No aortic stenosis is present.  6. The inferior vena cava is normal in size with greater than 50% respiratory variability, suggesting right atrial pressure of 3 mmHg. FINDINGS  Left Ventricle: Left ventricular ejection fraction, by estimation, is 30 to 35%. The left ventricle has moderately decreased function. The left ventricle demonstrates global hypokinesis. The left ventricular internal cavity size was mildly dilated. There is no left ventricular hypertrophy. Left ventricular diastolic parameters are consistent with Grade II diastolic dysfunction (pseudonormalization). Elevated left atrial pressure. Right Ventricle: The right ventricular size is normal. No increase in right ventricular wall thickness. Right ventricular systolic function  is normal. There is normal pulmonary artery systolic pressure. The tricuspid regurgitant velocity is 2.29 m/s, and  with an assumed right atrial pressure of 3 mmHg, the estimated right ventricular systolic pressure is 24.0 mmHg. Left Atrium: Left atrial size was mildly dilated. Right Atrium: Right atrial size was normal in size. Pericardium: There is no evidence of pericardial effusion. Mitral Valve: The mitral valve is grossly normal. Mild to moderate mitral valve regurgitation. No evidence of mitral valve stenosis. Tricuspid Valve: The tricuspid valve is grossly normal. Tricuspid valve regurgitation is mild . No evidence of tricuspid stenosis. Aortic Valve: The aortic valve is tricuspid. Aortic valve regurgitation is trivial. Aortic regurgitation PHT measures 671 msec. No aortic stenosis is present. Pulmonic Valve: The pulmonic valve was grossly normal. Pulmonic valve regurgitation is trivial. No evidence of pulmonic stenosis. Aorta: The aortic root is normal in size and structure. Venous: The inferior vena cava is normal in size with greater than 50% respiratory variability, suggesting right atrial pressure of 3 mmHg. IAS/Shunts: The atrial septum is grossly normal.  LEFT VENTRICLE PLAX 2D LVIDd:         5.66 cm     Diastology LVIDs:         4.61 cm     LV e' lateral:   8.38 cm/s LV PW:         1.07 cm     LV E/e' lateral: 10.1 LV IVS:        1.10 cm     LV e' medial:    4.68 cm/s LVOT diam:     2.00  cm     LV E/e' medial:  18.0 LV SV:         52 LV SV Index:   28 LVOT Area:     3.14 cm  LV Volumes (MOD) LV vol d, MOD A2C: 91.2 ml LV vol d, MOD A4C: 99.1 ml LV vol s, MOD A2C: 49.9 ml LV vol s, MOD A4C: 62.7 ml LV SV MOD A2C:     41.3 ml LV SV MOD A4C:     99.1 ml LV SV MOD BP:      37.9 ml RIGHT VENTRICLE RV S prime:     9.79 cm/s TAPSE (M-mode): 2.0 cm LEFT ATRIUM             Index       RIGHT ATRIUM           Index LA diam:        4.15 cm 2.22 cm/m  RA Area:     11.30 cm LA Vol (A2C):   65.1 ml 34.83 ml/m  RA Volume:   25.10 ml  13.43 ml/m LA Vol (A4C):   65.5 ml 35.04 ml/m LA Biplane Vol: 65.3 ml 34.94 ml/m  AORTIC VALVE LVOT Vmax:   99.40 cm/s LVOT Vmean:  64.000 cm/s LVOT VTI:    0.166 m AI PHT:      671 msec  AORTA Ao Root diam: 3.00 cm MITRAL VALVE                 TRICUSPID VALVE MV Area (PHT): 3.54 cm      TR Peak grad:   21.0 mmHg MV Decel Time: 214 msec      TR Vmax:        229.00 cm/s MR Peak grad:    91.4 mmHg MR Mean grad:    41.0 mmHg   SHUNTS MR Vmax:         478.00 cm/s Systemic VTI:  0.17 m MR Vmean:        287.0 cm/s  Systemic Diam: 2.00 cm MR PISA:         1.01 cm MR PISA Eff ROA: 7 mm MR PISA Radius:  0.40 cm MV E velocity: 84.30 cm/s MV A velocity: 76.40 cm/s MV E/A ratio:  1.10 Lennie Odor MD Electronically signed by Lennie Odor MD Signature Date/Time: 10/06/2019/4:54:39 PM    Final (Updated)    Scheduled Meds: . apixaban  5 mg Oral BID  . Chlorhexidine Gluconate Cloth  6 each Topical Daily  . diltiazem  30 mg Oral Q6H  . docusate sodium  200 mg Oral Daily  . insulin aspart  0-15 Units Subcutaneous TID WC  . latanoprost  1 drop Both Eyes QHS  . loratadine  10 mg Oral Daily  . metoprolol tartrate  100 mg Oral BID  . OLANZapine  10 mg Oral QHS  . pantoprazole  40 mg Oral Daily  . risperiDONE  1 mg Oral QHS  . senna-docusate  2 tablet Oral QHS  . traZODone  50 mg Oral QHS  . Vitamin D (Ergocalciferol)  50,000 Units Oral Q Mon   Continuous Infusions: . sodium chloride 10 mL/hr at 10/07/19 0700    LOS: 3 days   Critical Care Procedure Note Authorized and Performed by: Maryln Manuel MD  Total Critical Care time:  30 minutes  Due to a high probability of clinically significant, life threatening deterioration, the patient required my highest level of preparedness to intervene emergently and I personally spent  this critical care time directly and personally managing the patient.  This critical care time included obtaining a history; examining the patient, pulse oximetry;  ordering and review of studies; arranging urgent treatment with development of a management plan; evaluation of patient's response of treatment; frequent reassessment; and discussions with other providers.  This critical care time was performed to assess and manage the high probability of imminent and life threatening deterioration that could result in multi-organ failure.  It was exclusive of separately billable procedures and treating other patients and teaching time.    Standley Dakins, MD How to contact the [facility] Attending or Consulting provider 7A - 7P or covering provider during after hours 7P -7A, for this patient?  1. Check the care team in [facility] and look for a) attending/consulting TRH provider listed and b) the [facility] team listed 2. Log into [email or web address] and use 's universal password to access. If you do not have the password, please contact the hospital operator. 3. Locate the [facility] provider you are looking for under [facility] and page to a number that you can be directly reached. 4. If you still have difficulty reaching the provider, please page the [facility] (Director on Call) for the Hospitalists listed on amion for assistance.  10/07/2019, 1:09 PM

## 2019-10-07 NOTE — Progress Notes (Addendum)
   10/07/19 2223  Provider Notification  Provider Name/Title Doreatha Martin  Date Provider Notified 10/07/19  Time Provider Notified 2223  Notification Type Page  Notification Reason Other (Comment) (refusing tele monitor, delusional state of mind)  Response Other (Comment) (waiting)   Patient thinks that the wiring of the tele monitor is a snake.

## 2019-10-07 NOTE — Care Management Important Message (Signed)
Important Message  Patient Details  Name: Maria Pace MRN: [id number] Date of Birth: 15-Sep-1940   Medicare Important Message Given:  Yes     Corey Harold 10/07/2019, 4:30 PM

## 2019-10-07 NOTE — TOC Progression Note (Signed)
Transition of Care [facility]) - Progression Note    Patient Details  Name: Maria Pace MRN: [id number] Date of Birth: 1940/04/13  Transition of Care [facility]) CM/SW Contact  Karn Cassis, [place] Phone Number: 10/07/2019, 2:49 PM  Clinical Narrative:  Per MD, pt is medically stable. Awaiting TTS assessment for recommendations. TOC will continue to follow.       Expected Discharge Plan: Rest Home Barriers to Discharge: Continued Medical Work up  Expected Discharge Plan and Services Expected Discharge Plan: Rest Home In-house Referral: Clinical Social Work     Living arrangements for the past 2 months: Group Home                                       Social Determinants of Health (SDOH) Interventions    Readmission Risk Interventions No flowsheet data found.

## 2019-10-08 LAB — CBC
HCT: 33.7 % — ABNORMAL LOW (ref 36.0–46.0)
Hemoglobin: 10.4 g/dL — ABNORMAL LOW (ref 12.0–15.0)
MCH: 27.2 pg (ref 26.0–34.0)
MCHC: 30.9 g/dL (ref 30.0–36.0)
MCV: 88.2 fL (ref 80.0–100.0)
Platelets: 285 10*3/uL (ref 150–400)
RBC: 3.82 MIL/uL — ABNORMAL LOW (ref 3.87–5.11)
RDW: 14.3 % (ref 11.5–15.5)
WBC: 11.9 10*3/uL — ABNORMAL HIGH (ref 4.0–10.5)
nRBC: 0 % (ref 0.0–0.2)

## 2019-10-08 LAB — URINE CULTURE: Culture: NO GROWTH

## 2019-10-08 LAB — MAGNESIUM: Magnesium: 1.5 mg/dL — ABNORMAL LOW (ref 1.7–2.4)

## 2019-10-08 LAB — BASIC METABOLIC PANEL
Anion gap: 10 (ref 5–15)
BUN: 14 mg/dL (ref 8–23)
CO2: 24 mmol/L (ref 22–32)
Calcium: 9.4 mg/dL (ref 8.9–10.3)
Chloride: 100 mmol/L (ref 98–111)
Creatinine, Ser: 0.93 mg/dL (ref 0.44–1.00)
GFR calc Af Amer: >60 mL/min (ref >60)
GFR calc non Af Amer: 60 mL/min — ABNORMAL LOW (ref >60)
Glucose, Bld: 211 mg/dL — ABNORMAL HIGH (ref 70–99)
Potassium: 4 mmol/L (ref 3.5–5.1)
Sodium: 134 mmol/L — ABNORMAL LOW (ref 135–145)

## 2019-10-08 LAB — GLUCOSE, CAPILLARY
Glucose-Capillary: 219 mg/dL — ABNORMAL HIGH (ref 70–99)
Glucose-Capillary: 130 mg/dL — ABNORMAL HIGH (ref 70–99)
Glucose-Capillary: 269 mg/dL — ABNORMAL HIGH (ref 70–99)
Glucose-Capillary: 169 mg/dL — ABNORMAL HIGH (ref 70–99)

## 2019-10-08 MED ORDER — MAGNESIUM SULFATE 2 GM/50ML IV SOLN
2.0000 g | Freq: Once | INTRAVENOUS | Status: AC
  Administered 2019-10-08: 2 g via INTRAVENOUS
  Filled 2019-10-08: qty 50

## 2019-10-08 MED ORDER — HYDRALAZINE HCL 10 MG PO TABS
10.0000 mg | ORAL_TABLET | Freq: Three times a day (TID) | ORAL | Status: DC
  Administered 2019-10-08 – 2019-10-09 (×2): 10 mg via ORAL
  Filled 2019-10-08 (×3): qty 1

## 2019-10-08 NOTE — Care Management (Signed)
Writer referred patient to the following facilities: [facility] Details  Fax          [address], [place] [address]     Internal comment    [facility] Details  Fax        [address] [place], [place] [address]     Internal comment    [facility] Details  Fax        [address] [place] [address]     Internal comment    [facility] Details  Fax        [address], [place] [address]     Internal comment    [facility] Details  Fax        [address], [place] [address]     Internal comment    [facility] Details  Fax        [address] [place] [address]     Internal comment

## 2019-10-08 NOTE — TOC Initial Note (Signed)
Transition of Care [facility]) - Initial/Assessment Note    Patient Details  Name: Maria Pace MRN: [id number] Date of Birth: [birth year]  Transition of Care [facility]) CM/SW Contact:    Karn Cassis, [name] Phone Number: 10/08/2019, 3:43 PM  Clinical Narrative:   [name] called TTS to make sure they were aware that pt is medically stable for d/c and awaiting TTS evaluation. Sarah with TTS indicates she will notify counselors. TOC to follow.                 Expected Discharge Plan: Rest Home Barriers to Discharge: Continued Medical Work up   Patient Goals and CMS Choice Patient states their goals for this hospitalization and ongoing recovery are:: return to [facility]      Expected Discharge Plan and Services Expected Discharge Plan: Rest Home In-house Referral: Clinical Social Work     Living arrangements for the past 2 months: Group Home                                      Prior Living Arrangements/Services Living arrangements for the past 2 months: Group Home Lives with:: Facility Resident Patient language and need for interpreter reviewed:: Yes Do you feel safe going back to the place where you live?: Yes      Need for Family Participation in Patient Care: No (Comment) Care giver support system in place?: Yes (comment) Current home services: DME (walker) Criminal Activity/Legal Involvement Pertinent to Current Situation/Hospitalization: No - Comment as needed  Activities of Daily Living Home Assistive Devices/Equipment: None ADL Screening (condition at time of admission) Patient's cognitive ability adequate to safely complete daily activities?: Yes Is the patient deaf or have difficulty hearing?: No Does the patient have difficulty seeing, even when wearing glasses/contacts?: No Does the patient have difficulty concentrating, remembering, or making decisions?: Yes Patient able to express need for assistance with ADLs?: Yes Does the patient have difficulty  dressing or bathing?: No Independently performs ADLs?: No Communication: Independent Dressing (OT): Needs assistance Is this a change from baseline?: Pre-admission baseline Grooming: Needs assistance Is this a change from baseline?: Pre-admission baseline Feeding: Independent Bathing: Needs assistance Is this a change from baseline?: Pre-admission baseline Toileting: Independent In/Out Bed: Independent Walks in Home: Independent Does the patient have difficulty walking or climbing stairs?: No Weakness of Legs: None Weakness of Arms/Hands: None  Permission Sought/Granted Permission sought to share information with : Facility Industrial/product designer granted to share information with : Yes, Verbal Permission Granted     Permission granted to share info w AGENCY: [facility]  Permission granted to share info w Relationship: facility     Emotional Assessment Appearance:: Appears stated age Attitude/Demeanor/Rapport: Unable to Assess Affect (typically observed): Unable to Assess Orientation: : Oriented to Self Alcohol / Substance Use: Not Applicable    Admission diagnosis:  Hyponatremia [E87.1] Metabolic acidosis [E87.2] AKI (acute kidney injury) (HCC) [N17.9] Schizophrenia, acute (HCC) [F23] ARF (acute renal failure) (HCC) [N17.9] Patient Active Problem List   Diagnosis Date Noted  . ARF (acute renal failure) (HCC) 10/04/2019  . Elevated CK 10/03/2019  . SVT (supraventricular tachycardia) (HCC) 10/03/2019  . GERD (gastroesophageal reflux disease)   . Schizophrenia (HCC) 11/15/2017  . Hypotension 02/20/2017  . Severe dehydration 02/20/2017  . AKI (acute kidney injury) (HCC) 02/20/2017  . Constipation 02/20/2017  . Lactic acidosis 02/20/2017  . Urinary retention 11/10/2016  . Hypertension 11/10/2016  .  Type 2 diabetes mellitus (HCC) 11/10/2016  . Hyperlipidemia 11/10/2016  . Hyponatremia 11/10/2016  . SBO (small bowel obstruction) (HCC) 11/03/2015   PCP:   Avon Gully, MD Pharmacy:   [facility] [place] - [address] [place] [address] Phone: [phone] Fax: [phone]     Social Determinants of Health (SDOH) Interventions    Readmission Risk Interventions No flowsheet data found.

## 2019-10-08 NOTE — Plan of Care (Signed)

## 2019-10-08 NOTE — Progress Notes (Signed)
PROGRESS NOTE   [name]  QIO:[id number] DOB: 06/16/1940 DOA: 10/03/2019 PCP: Maria Pace, Tesfaye, MD   No chief complaint on file.  Brief Admission History:  79 y.o. female with medical history significant of bipolar 1 disorder, schizophrenia, essential hypertension, GERD, hyperlipidemia, history of irregular heartbeat, type 2 diabetes mellitus who is brought from her assisted living facility due to becoming confused, locking herself in her room, then being found lying down on the floor with a broken bed, the TV knocked off the TV stand and the patient stating that everything in the room that had color needed to be removed by the staff because anything with color contain the devil.  The staff from the facility suspects that the patient has not been taking her medications.  She has some trouble elaborating about what has happened over the past few days.  It seems that she has not been eating or drinking properly.  She seems to be in no distress at the moment and she is able to answer simple questions and denies having headache, chest pain, dyspnea, abdominal or joint pain at this time.  Assessment & Plan:   Principal Problem:   AKI (acute kidney injury) (HCC) Active Problems:   Hypertension   Type 2 diabetes mellitus (HCC)   Hyperlipidemia   Hyponatremia   Schizophrenia (HCC)   GERD (gastroesophageal reflux disease)   Elevated CK   SVT (supraventricular tachycardia) (HCC)   ARF (acute renal failure) (HCC)  1. AKI - RESOLVED.  Suspect was prerenal as she admits she has not been eating and drinking well over last several days possibly due to mental breakdown, she is responding to IV fluids. Follow creatinine.  IMPROVING.  2. Hyponatremia - suspect from dehydration, continue hydration and follow.  IMPROVED.  3. Type 2 DM - continue SSI coverage, carb modified diet and CBG testing.  STABLE 4. Paroxysmal Atrial Fibrillation with RVR - Has been difficult to control.  It started with SVT and then  she had brief episodes of Afib RVR.  This is better controlled with metoprolol to 100 mg BID, and cardizem CD 120 mg daily, replacing Mg, apixaban for anticoagulation, monitor for bleeding complications.   Echo below, TSH 1.481.  5. Schizophrenia - resumed on home meds.  IVC was rescinded 7/10 by Maria Pace. Maria Thumma MD.  Consulted TTS 7/12.  PT IS MEDICALLY STABLE. Awaiting TTS evaluation for disposition.  6. Hypomagnesemia -REPLETED.   7. GERD - stable on protonix for GI protection.  8. Vitamin D deficiency - moderately severe, I have added Drisdol 50,000 IU caps weekly.  9. Essential hypertension - BP poorly controlled, added losartan and aldactone.  10. Hypomagnesemia - IV replacement ordered, recheck Mg in AM.  11. Elevated CK - continue IV fluid hydration and follow CK level.  RESOLVED.  12. Chronic combined systolic and diastolic heart failure - Echo reveals EF 30-35%.  Her volume status appears normal at this time. Added losartan, she is already on metoprolol.  Added aldactone.  She will need outpatient cardiology follow up.    ECHOCARDIOGRAM IMPRESSIONS  1. Left ventricular ejection fraction, by estimation, is 30 to 35%. The  left ventricle has moderately decreased function. The left ventricle  demonstrates global hypokinesis. The left ventricular internal cavity size  was mildly dilated. Left ventricular  diastolic parameters are consistent with Grade II diastolic dysfunction  (pseudonormalization). Elevated left atrial pressure.  2. Right ventricular systolic function is normal. The right ventricular  size is normal. There is normal pulmonary artery  systolic pressure. The  estimated right ventricular systolic pressure is 24.0 mmHg.  3. Left atrial size was mildly dilated.  4. The mitral valve is grossly normal. Mild to moderate mitral valve  regurgitation. No evidence of mitral stenosis.  5. The aortic valve is tricuspid. Aortic valve regurgitation is trivial.  No aortic stenosis is  present.  6. The inferior vena cava is normal in size with greater than 50%  respiratory variability, suggesting right atrial pressure of 3 mmHg.    DVT prophylaxis: enoxaparin  Code Status: full  Family Communication: sister Bunny at bedside 10/04/19, 10/06/10, 7/12 Disposition: return to ALF versus inpatient psych admission  Status is: Inpatient  Remains inpatient appropriate because:IV treatments appropriate due to intensity of illness or inability to take PO.  Awaiting TTS consult for behavioral health evaluation     Dispo: The patient is from: ALF              Anticipated d/c is to: ALF versus inpatient behavioral health               Anticipated d/c date is: 1 days              Patient currently is medically stable to d/c.  Consultants:     Procedures:     Antimicrobials:   Subjective: Pt was sedated with lorazepam this morning and now sleeping but arousable.   Objective: Vitals:   10/07/19 2042 10/08/19 0049 10/08/19 0448 10/08/19 1300  BP: (!) 162/93 (!) 173/96 (!) 141/75 123/85  Pulse: 99 (!) 110 76 93  Resp: 20 20 20 18   Temp: 98 F (36.7 C) 98.3 F (36.8 C) 98.5 F (36.9 C) 98.3 F (36.8 C)  TempSrc: Oral Oral Oral Oral  SpO2: 95% 95% 97% 96%  Weight:      Height:        Intake/Output Summary (Last 24 hours) at 10/08/2019 1522 Last data filed at 10/08/2019 1300 Gross per 24 hour  Intake 1235.38 ml  Output 3020 ml  Net -1784.62 ml   Filed Weights   10/05/19 0511 10/06/19 0500 10/07/19 0530  Weight: 90.1 kg 91 kg 88.3 kg    Examination:  General exam: Appears calm and comfortable, a little agitation, (+)active hallucinations and delusions, did not verbalize delusions at this time.   Respiratory system: Clear to auscultation. Respiratory effort normal. Cardiovascular system: normal S1 & S2 heard. Irregularly irregular.  No JVD, murmurs, rubs, gallops or clicks. No pedal edema. Gastrointestinal system: Abdomen is nondistended, soft and nontender.  No organomegaly or masses felt. Normal bowel sounds heard. Central nervous system: Alert and oriented. No focal neurological deficits. Extremities: Symmetric 5 x 5 power. Skin: No rashes, lesions or ulcers Psychiatry: Poor Judgement and insight with delusions and hallucinations. Mood & affect flat.   Data Reviewed: I have personally reviewed following labs and imaging studies  CBC: Recent Labs  Lab 10/03/19 1807 10/04/19 0448 10/06/19 0534 10/07/19 0423 10/08/19 0543  WBC 13.1* 12.9* 8.9 10.8* 11.9*  HGB 10.6* 8.7* 9.4* 9.8* 10.4*  HCT 32.8* 27.8* 29.8* 30.3* 33.7*  MCV 85.4 87.1 88.4 86.6 88.2  PLT 298 214 254 274 285    Basic Metabolic Panel: Recent Labs  Lab 10/04/19 0448 10/05/19 0430 10/06/19 0534 10/07/19 0423 10/08/19 0543  NA 130* 133* 133* 134* 134*  K 3.5 3.9 3.9 4.0 4.0  CL 100 102 101 101 100  CO2 22 24 24 24 24   GLUCOSE 139* 113* 126* 138* 211*  BUN 21  16 9 9 14   CREATININE 1.56* 1.11* 0.92 0.91 0.93  CALCIUM 7.9* 8.5* 8.7* 9.3 9.4  MG  --  1.3* 1.3* 1.8 1.5*    GFR: Estimated Creatinine Clearance: 50.9 mL/min (by C-G formula based on SCr of 0.93 mg/dL).  Liver Function Tests: Recent Labs  Lab 10/03/19 1807  AST 24  ALT 18  ALKPHOS 81  BILITOT 0.7  PROT 8.1  ALBUMIN 4.3  4.3    CBG: Recent Labs  Lab 10/07/19 1137 10/07/19 1546 10/07/19 2044 10/08/19 0750 10/08/19 1104  GLUCAP 209* 139* 149* 219* 130*    Recent Results (from the past 240 hour(s))  SARS Coronavirus 2 by RT PCR (hospital order, performed in [facility] hospital lab) Nasopharyngeal Nasopharyngeal Swab     Status: None   Collection Time: 10/03/19  8:36 PM   Specimen: Nasopharyngeal Swab  Result Value Ref Range Status   SARS Coronavirus 2 NEGATIVE NEGATIVE Final    Comment: (NOTE) SARS-CoV-2 target nucleic acids are NOT DETECTED.  The SARS-CoV-2 RNA is generally detectable in upper and lower respiratory specimens during the acute phase of infection. The  lowest concentration of SARS-CoV-2 viral copies this assay can detect is 250 copies / mL. A negative result does not preclude SARS-CoV-2 infection and should not be used as the sole basis for treatment or other patient management decisions.  A negative result may occur with improper specimen collection / handling, submission of specimen other than nasopharyngeal swab, presence of viral mutation(s) within the areas targeted by this assay, and inadequate number of viral copies (<250 copies / mL). A negative result must be combined with clinical observations, patient history, and epidemiological information.  Fact Sheet for Patients:   12/04/19  Fact Sheet for Healthcare Providers: BoilerBrush.com.cy  This test is not yet approved or  cleared by the https://pope.com/ FDA and has been authorized for detection and/or diagnosis of SARS-CoV-2 by FDA under an Emergency Use Authorization (EUA).  This EUA will remain in effect (meaning this test can be used) for the duration of the COVID-19 declaration under Section 564(b)(1) of the Act, 21 U.S.C. section 360bbb-3(b)(1), unless the authorization is terminated or revoked sooner.  Performed at [facility], [address] [place], [place]   MRSA PCR Screening     Status: None   Collection Time: 10/03/19 11:07 PM   Specimen: Nasal Mucosa; Nasopharyngeal  Result Value Ref Range Status   MRSA by PCR NEGATIVE NEGATIVE Final    Comment:        The GeneXpert MRSA Assay (FDA approved for NASAL specimens only), is one component of a comprehensive MRSA colonization surveillance program. It is not intended to diagnose MRSA infection nor to guide or monitor treatment for MRSA infections. Performed at [facility], [address], [place], [place]   Culture, Urine     Status: None   Collection Time: 10/06/19  4:00 PM   Specimen: Urine, Clean Catch  Result Value Ref Range  Status   Specimen Description   Final    URINE, CLEAN CATCH Performed at [facility], [address], [place], [place]    Special Requests   Final    NONE Performed at [facility], [address] [place], [place]    Culture   Final    NO GROWTH Performed at [facility], [address], [place], [place]    Report Status 10/08/2019 FINAL  Final     Radiology Studies:  No results found. Scheduled Meds: . apixaban  5 mg Oral BID  . Chlorhexidine Gluconate Cloth  6 each Topical Daily  . diltiazem  120 mg Oral Daily  . docusate sodium  200 mg Oral Daily  . hydrALAZINE  10 mg Oral Q8H  . insulin aspart  0-15 Units Subcutaneous TID WC  . latanoprost  1 drop Both Eyes QHS  . loratadine  10 mg Oral Daily  . losartan  25 mg Oral Daily  . metoprolol tartrate  100 mg Oral BID  . OLANZapine  10 mg Oral QHS  . pantoprazole  40 mg Oral Daily  . risperiDONE  1 mg Oral QHS  . senna-docusate  2 tablet Oral QHS  . spironolactone  12.5 mg Oral Daily  . traZODone  50 mg Oral QHS  . Vitamin D (Ergocalciferol)  50,000 Units Oral Q Mon   Continuous Infusions: . sodium chloride Stopped (10/07/19 1632)    LOS: 4 days   Standley Dakins, MD How to contact the [facility] Attending or Consulting provider 7A - 7P or covering provider during after hours 7P -7A, for this patient?  1. Check the care team in [facility] and look for a) attending/consulting TRH provider listed and b) the [facility] team listed 2. Log into [email or web address] and use [facility]'s universal password to access. If you do not have the password, please contact the hospital operator. 3. Locate the [facility] provider you are looking for under [facility] and page to a number that you can be directly reached. 4. If you still have difficulty reaching the provider, please page the [facility] (Director on Call) for the Hospitalists listed on amion for assistance.  10/08/2019, 3:22 PM
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
## 2019-10-09 ENCOUNTER — Inpatient Hospital Stay (HOSPITAL_COMMUNITY): Payer: Medicare Other

## 2019-10-09 DIAGNOSIS — I5042 Chronic combined systolic (congestive) and diastolic (congestive) heart failure: Secondary | ICD-10-CM

## 2019-10-09 DIAGNOSIS — F203 Undifferentiated schizophrenia: Secondary | ICD-10-CM

## 2019-10-09 LAB — BASIC METABOLIC PANEL
Anion gap: 10 (ref 5–15)
BUN: 23 mg/dL (ref 8–23)
CO2: 24 mmol/L (ref 22–32)
Calcium: 9.1 mg/dL (ref 8.9–10.3)
Chloride: 98 mmol/L (ref 98–111)
Creatinine, Ser: 1.74 mg/dL — ABNORMAL HIGH (ref 0.44–1.00)
GFR calc Af Amer: 32 mL/min — ABNORMAL LOW (ref >60)
GFR calc non Af Amer: 28 mL/min — ABNORMAL LOW (ref >60)
Glucose, Bld: 149 mg/dL — ABNORMAL HIGH (ref 70–99)
Potassium: 4.4 mmol/L (ref 3.5–5.1)
Sodium: 132 mmol/L — ABNORMAL LOW (ref 135–145)

## 2019-10-09 LAB — URINALYSIS, COMPLETE (UACMP) WITH MICROSCOPIC
Bacteria, UA: NONE SEEN
Bilirubin Urine: NEGATIVE
Glucose, UA: NEGATIVE mg/dL
Hgb urine dipstick: NEGATIVE
Ketones, ur: NEGATIVE mg/dL
Leukocytes,Ua: NEGATIVE
Nitrite: NEGATIVE
Protein, ur: NEGATIVE mg/dL
Specific Gravity, Urine: 1.009 (ref 1.005–1.030)
pH: 6 (ref 5.0–8.0)

## 2019-10-09 LAB — GLUCOSE, CAPILLARY
Glucose-Capillary: 139 mg/dL — ABNORMAL HIGH (ref 70–99)
Glucose-Capillary: 111 mg/dL — ABNORMAL HIGH (ref 70–99)
Glucose-Capillary: 132 mg/dL — ABNORMAL HIGH (ref 70–99)
Glucose-Capillary: 138 mg/dL — ABNORMAL HIGH (ref 70–99)
Glucose-Capillary: 117 mg/dL — ABNORMAL HIGH (ref 70–99)

## 2019-10-09 LAB — CBC
HCT: 31.5 % — ABNORMAL LOW (ref 36.0–46.0)
Hemoglobin: 9.8 g/dL — ABNORMAL LOW (ref 12.0–15.0)
MCH: 27.4 pg (ref 26.0–34.0)
MCHC: 31.1 g/dL (ref 30.0–36.0)
MCV: 88 fL (ref 80.0–100.0)
Platelets: 285 10*3/uL (ref 150–400)
RBC: 3.58 MIL/uL — ABNORMAL LOW (ref 3.87–5.11)
RDW: 14.3 % (ref 11.5–15.5)
WBC: 14 10*3/uL — ABNORMAL HIGH (ref 4.0–10.5)
nRBC: 0 % (ref 0.0–0.2)

## 2019-10-09 LAB — CK: Total CK: 288 U/L — ABNORMAL HIGH (ref 38–234)

## 2019-10-09 LAB — MAGNESIUM: Magnesium: 2.1 mg/dL (ref 1.7–2.4)

## 2019-10-09 LAB — AMMONIA: Ammonia: <9 umol/L — ABNORMAL LOW (ref 9–35)

## 2019-10-09 MED ORDER — CYANOCOBALAMIN 1000 MCG/ML IJ SOLN
1000.0000 ug | Freq: Once | INTRAMUSCULAR | Status: AC
  Administered 2019-10-09: 1000 ug via INTRAMUSCULAR
  Filled 2019-10-09: qty 1

## 2019-10-09 MED ORDER — METOPROLOL TARTRATE 50 MG PO TABS
25.0000 mg | ORAL_TABLET | Freq: Two times a day (BID) | ORAL | Status: DC
  Administered 2019-10-09 – 2019-10-11 (×5): 25 mg via ORAL
  Filled 2019-10-09 (×4): qty 1

## 2019-10-09 MED ORDER — CARVEDILOL 3.125 MG PO TABS: 3.1250 mg | ORAL_TABLET | Freq: Two times a day (BID) | ORAL | Status: DC

## 2019-10-09 MED ORDER — SODIUM CHLORIDE 0.9 % IV SOLN: INTRAVENOUS | Status: AC

## 2019-10-09 MED ORDER — SODIUM CHLORIDE 0.9 % IV SOLN: INTRAVENOUS | Status: DC

## 2019-10-09 MED ORDER — VITAMIN B-12 100 MCG PO TABS
500.0000 ug | ORAL_TABLET | Freq: Every day | ORAL | Status: DC
  Administered 2019-10-10 – 2019-10-11 (×2): 500 ug via ORAL
  Filled 2019-10-09 (×2): qty 5

## 2019-10-09 NOTE — Evaluation (Signed)
Physical Therapy Evaluation Patient Details Name: Maria Pace MRN: [id number] DOB: 1940-09-28 Today's Date: 10/09/2019   History of Present Illness  Maria Pace is a 79 y.o. female with medical history significant of bipolar 1 disorder, schizophrenia, essential hypertension, GERD, hyperlipidemia, history of irregular heartbeat, type 2 diabetes mellitus who is brought from her assisted living facility due to becoming confused, locking herself in her room, then being found lying down on the floor with a broken bed, the TV knocked off the TV stand and the patient stating that everything in the room that had color needed to be removed by the staff because anything with color contain the devil.  The staff from the facility suspects that the patient has not been taking her medications.  She has some trouble elaborating about what has happened over the past few days.  It seems that she has not been eating or drinking properly.  She seems to be in no distress at the moment and she is able to answer simple questions and denies having headache, chest pain, dyspnea, abdominal or joint pain at this time.    Clinical Impression  Patient functioning near baseline for functional mobility and gait, requires encouragement to participate, required tactile cueing for patient to initiate movement, after a few minutes patient became verbal and cooperated appropriately and able to ambulate in hallway without loss of balance using RW.  Patient tolerated sitting up in chair after therapy - RN aware.  Patient will benefit from continued physical therapy in hospital and recommended venue below to increase strength, balance, endurance for safe ADLs and gait.       Follow Up Recommendations Home health PT;Supervision for mobility/OOB;Supervision - Intermittent    Equipment Recommendations  None recommended by PT    Recommendations for Other Services       Precautions / Restrictions Precautions Precautions:  Fall Restrictions Weight Bearing Restrictions: No      Mobility  Bed Mobility Overal bed mobility: Needs Assistance Bed Mobility: Supine to Sit     Supine to sit: Min assist     General bed mobility comments: labored movement, increased time  Transfers Overall transfer level: Needs assistance Equipment used: Rolling walker (2 wheeled);None Transfers: Sit to/from [employer] to Stand: Min guard;Min assist Stand pivot transfers: Min guard;Min assist       General transfer comment: patient has difficulty completing sit to stands without AD due to BLE weakness, required RW for safety  Ambulation/Gait Ambulation/Gait assistance: Supervision;Min guard Gait Distance (Feet): 75 Feet Assistive device: Rolling walker (2 wheeled) Gait Pattern/deviations: Decreased step length - right;Decreased step length - left;Decreased stride length Gait velocity: decreased   General Gait Details: slightly labored cadence without loss of balance, limited due to fatigue  Stairs            Wheelchair Mobility    Modified Rankin (Stroke Patients Only)       Balance Overall balance assessment: Needs assistance Sitting-balance support: Feet supported;No upper extremity supported Sitting balance-Leahy Scale: Good Sitting balance - Comments: seated at EOB   Standing balance support: During functional activity;No upper extremity supported Standing balance-Leahy Scale: Poor Standing balance comment: fair/poor without AD, fair/good using RW                             Pertinent Vitals/Pain Pain Assessment: No/denies pain    Home Living Family/patient expects to be discharged to:: Group home  Prior Function Level of Independence: Needs assistance   Gait / Transfers Assistance Needed: Patient ambulates with rollator walker  ADL's / Homemaking Assistance Needed: assisted by group home staff        Hand Dominance    Dominant Hand: Left    Extremity/Trunk Assessment   Upper Extremity Assessment Upper Extremity Assessment: Overall WFL for tasks assessed    Lower Extremity Assessment Lower Extremity Assessment: Generalized weakness    Cervical / Trunk Assessment Cervical / Trunk Assessment: Normal  Communication   Communication: No difficulties  Cognition Arousal/Alertness: Awake/alert Behavior During Therapy: WFL for tasks assessed/performed Overall Cognitive Status: History of cognitive impairments - at baseline                                        General Comments      Exercises     Assessment/Plan    PT Assessment Patient needs continued PT services  PT Problem List Decreased strength;Decreased activity tolerance;Decreased balance;Decreased mobility       PT Treatment Interventions Balance training;Gait training;Stair training;Functional mobility training;Therapeutic activities;Therapeutic exercise;Patient/family education    PT Goals (Current goals can be found in the Care Plan section)  Acute Rehab PT Goals Patient Stated Goal: return home with group home staff to assist PT Goal Formulation: With patient Time For Goal Achievement: 10/16/19 Potential to Achieve Goals: Good    Frequency Min 3X/week   Barriers to discharge        Co-evaluation               AM-PAC PT "6 Clicks" Mobility  Outcome Measure Help needed turning from your back to your side while in a flat bed without using bedrails?: A Little Help needed moving from lying on your back to sitting on the side of a flat bed without using bedrails?: A Little Help needed moving to and from a bed to a chair (including a wheelchair)?: A Little Help needed standing up from a chair using your arms (e.g., wheelchair or bedside chair)?: A Little Help needed to walk in hospital room?: A Little Help needed climbing 3-5 steps with a railing? : A Lot 6 Click Score: 17    End of Session    Activity Tolerance: Patient tolerated treatment well;Patient limited by fatigue Patient left: in chair;with call bell/phone within reach Nurse Communication: Mobility status PT Visit Diagnosis: Unsteadiness on feet (R26.81);Other abnormalities of gait and mobility (R26.89);Muscle weakness (generalized) (M62.81)    Time: 7654-6503 PT Time Calculation (min) (ACUTE ONLY): 20 min   Charges:   PT Evaluation $PT Eval Moderate Complexity: 1 Mod PT Treatments $Therapeutic Activity: 8-22 mins        2:29 PM, 10/09/19 Ocie Bob, MPT Physical Therapist with [facility] [phone] office [phone] mobile phone

## 2019-10-09 NOTE — Plan of Care (Signed)
  Problem: Acute Rehab PT Goals(only PT should resolve) Goal: Pt Will Go Supine/Side To Sit Outcome: Progressing Flowsheets (Taken 10/09/2019 1430) Pt will go Supine/Side to Sit: with supervision Goal: Patient Will Transfer Sit To/From Stand Outcome: Progressing Flowsheets (Taken 10/09/2019 1430) Patient will transfer sit to/from stand: with supervision Goal: Pt Will Transfer Bed To Chair/Chair To Bed Outcome: Progressing Flowsheets (Taken 10/09/2019 1430) Pt will Transfer Bed to Chair/Chair to Bed: with supervision Goal: Pt Will Ambulate Outcome: Progressing Flowsheets (Taken 10/09/2019 1430) Pt will Ambulate:  100 feet  with supervision  with rolling walker   2:31 PM, 10/09/19 Ocie Bob, MPT Physical Therapist with [facility] [phone] office [phone] mobile phone

## 2019-10-09 NOTE — TOC Progression Note (Signed)
Transition of Care [facility]) - Progression Note    Patient Details  Name: Maria Pace MRN: [id number] Date of Birth: 1940-10-12  Transition of Care [facility]) CM/SW Contact  Maria Pace, [place] Phone Number: 10/09/2019, 7:45 AM  Clinical Narrative:  [name] aware recommendation is for inpatient gero-psych. [name] updated Maria Pace at [place]. Per notes, pt's sister was present for assessment. TOC will continue to follow.     Expected Discharge Plan: Rest Home Barriers to Discharge: Continued Medical Work up  Expected Discharge Plan and Services Expected Discharge Plan: Rest Home In-house Referral: Clinical Social Work     Living arrangements for the past 2 months: Group Home                                       Social Determinants of Health (SDOH) Interventions    Readmission Risk Interventions No flowsheet data found.

## 2019-10-09 NOTE — BH Assessment (Addendum)
Per Rhea Pink, Patient accepted to [facility] in [place]. Upon review of patient's medical documentation, patient is not medically cleared. This information was obtained by the Hospitalists' documentation noted in EPIC: "Anticipated d/c is to: [place]. Anticipated d/c date is: 1-2 days if mental status and Na and renal function improves. Patient currently is not medically stable to d/c". Amerie from [facility] states that patient's bed will be held for 48 hrs. She asked that someone call 10/10/2019 to provide updates on patient's medical status/clearance. The number to the facility is [phone].  This clinician tried call patient's nurse. Phone rang several times.  However, no answer. Will attempt to message nurse via secure chat.

## 2019-10-09 NOTE — Care Management Important Message (Signed)
Important Message  Patient Details  Name: Maria Pace MRN: [id number] Date of Birth: 1940/10/17   Medicare Important Message Given:  Yes     Corey Harold 10/09/2019, 3:21 PM

## 2019-10-09 NOTE — Progress Notes (Signed)
In/Out cath performed. Output 1100. Patient tolerated just fine

## 2019-10-09 NOTE — BH Assessment (Signed)
Per Caryn Bee, NP the patient is recommended for inpatient gero-psych treatment.  Patient has been referred to the following facilities for gero-psychiatric services:   [facility]  [facility]  [facility]  [facility]  [facility]  [facility]  [facility]    Disposition CSW/ TTS will continue to follow for potential placement.    Baldo Daub, MSW, [name] Clinical Social Worker [facility]

## 2019-10-09 NOTE — Progress Notes (Signed)
PROGRESS NOTE  Maria Pace PTW:[id number] DOB: [birth year] DOA: 10/03/2019 PCP: Avon Gully, MD  Brief History:  79 y.o.femalewith medical history significant ofbipolar 1 disorder, schizophrenia, essential hypertension, GERD, hyperlipidemia, history of irregular heartbeat, type 2 diabetes mellitus who is brought from her assisted living facility due to becoming confused, locking herself in her room, then being found lying down on the floor with a broken bed, the TV knocked off the TV stand and the patient stating that everything in the roomthat hadcolor needed to be removed by the staffbecause anything with colorcontain the devil. The staff from the facility suspects that the patient has not been taking her medications. She has some trouble elaborating about what has happened over the past few days. It seems that she has not been eating or drinking properly. She seems to be in no distress at the moment and she is able to answer simple questions and denies having headache, chest pain, dyspnea, abdominal or joint pain at this time.  Assessment/Plan: Acute metabolic encephalopathy -Multifactorial including hyponatremia, acute kidney injury, medication induced and low B12 -supplement B12 -difficult to ascertain true baseline as patient "plays possum" -pt refuses to speak and interact, but has been seen to move all 4 extremities antigravity and wakes up when staff leaves the room -check ABG -MR brain  Acute kidney injury -Baseline creatinine 1.0-1.3 -Serum creatinine peaked 2.30 -Secondary to volume depletion -Initially placed on IV fluids -10/09/2019 serum creatinine rising again--likely secondary to hemodynamic changes and volume depletion -Renal ultrasound  Paroxysmal atrial fibrillation -Discontinue diltiazem in the setting of suppressed EF -10/06/2019 echo EF 30-35%, grade 2 DD, mild to moderate MR, mild TR -Restart metoprolol at lower dose due to soft BPs -continue  apixaban -TSH 1.481  Hyponatremia -Review of medical records shows that the patient has a history of chronic hyponatremia with sodium ranging 128-131  -secondary to volume depletion and poor solute intake -Restart IV fluids  Essential HTN -soft BPs currently -D/c losartan, hydralazine, diltiazem, spironolactone -decrease dose of metoprolol  Chronic combined systolic and diastolic CHF -10/06/19 Echo EF 70-01%, G2DD, mild TR, Mild to mod MR -continue metoprolol -appears euvolemic  DM2 -10/03/19 A1C--7.8 -continue novolog sliding scale  Hypomagnesemia -repleted  Schizophrenia -continue zyprexa, risperdal, trazodone  Vitamin D deficiency -continue drisdol     Status is: Inpatient  Remains inpatient appropriate because:Altered mental status Remains encephalopathic  Dispo: The patient is from: ALF              Anticipated d/c is to: Geri-psych              Anticipated d/c date is: 1-2 days if mental status and Na and renal function improves              Patient currently is not medically stable to d/c.        Family Communication:  no Family at bedside  Consultants:  TTS  Code Status:  FULL  DVT Prophylaxis: apixaban   Procedures: As Listed in Progress Note Above  Antibiotics: None       Subjective: Pt refuses to wake up or follow commands.  She is seen by staff to wake up when they leave the room.  She refuses to speak or follow commands.  ROS not possible  Objective: Vitals:   10/08/19 1300 10/08/19 1948 10/09/19 0437 10/09/19 0654  BP: 123/85 130/76 115/71 (!) 87/56  Pulse: 93 98 86 60  Resp: 18 16 14  20  Temp: 98.3 F (36.8 C) 98.8 F (37.1 C) 98 F (36.7 C)   TempSrc: Oral Oral Oral   SpO2: 96% 93% 100% 98%  Weight:      Height:        Intake/Output Summary (Last 24 hours) at 10/09/2019 0955 Last data filed at 10/09/2019 0610 Gross per 24 hour  Intake 1220 ml  Output --  Net 1220 ml   Weight change:  Exam:   General:  Pt  gimaces to protopathic stimuli, does not follows commands appropriately, not in acute distress  HEENT: No icterus, No thrush, No neck mass, /AT  Cardiovascular: RRR, S1/S2, no rubs, no gallops  Respiratory: bibasilar rales. No wheeze  Abdomen: Soft/+BS, non tender, non distended, no guarding  Extremities: No edema, No lymphangitis, No petechiae, No rashes, no synovitis   Data Reviewed: I have personally reviewed following labs and imaging studies Basic Metabolic Panel: Recent Labs  Lab 10/05/19 0430 10/06/19 0534 10/07/19 0423 10/08/19 0543 10/09/19 0544  NA 133* 133* 134* 134* 132*  K 3.9 3.9 4.0 4.0 4.4  CL 102 101 101 100 98  CO2 24 24 24 24 24   GLUCOSE 113* 126* 138* 211* 149*  BUN 16 9 9 14 23   CREATININE 1.11* 0.92 0.91 0.93 1.74*  CALCIUM 8.5* 8.7* 9.3 9.4 9.1  MG 1.3* 1.3* 1.8 1.5* 2.1   Liver Function Tests: Recent Labs  Lab 10/03/19 1807  AST 24  ALT 18  ALKPHOS 81  BILITOT 0.7  PROT 8.1  ALBUMIN 4.3  4.3   No results for input(s): LIPASE, AMYLASE in the last 168 hours. No results for input(s): AMMONIA in the last 168 hours. Coagulation Profile: No results for input(s): INR, PROTIME in the last 168 hours. CBC: Recent Labs  Lab 10/04/19 0448 10/06/19 0534 10/07/19 0423 10/08/19 0543 10/09/19 0544  WBC 12.9* 8.9 10.8* 11.9* 14.0*  HGB 8.7* 9.4* 9.8* 10.4* 9.8*  HCT 27.8* 29.8* 30.3* 33.7* 31.5*  MCV 87.1 88.4 86.6 88.2 88.0  PLT 214 254 274 285 285   Cardiac Enzymes: Recent Labs  Lab 10/03/19 1807 10/04/19 0448 10/05/19 0430  CKTOTAL 381* 390* 286*   BNP: Invalid input(s): POCBNP CBG: Recent Labs  Lab 10/08/19 0750 10/08/19 1104 10/08/19 1617 10/08/19 2116 10/09/19 0744  GLUCAP 219* 130* 269* 169* 139*   HbA1C: No results for input(s): HGBA1C in the last 72 hours. Urine analysis:    Component Value Date/Time   COLORURINE STRAW (A) 10/03/2019 2053   APPEARANCEUR CLEAR 10/03/2019 2053   APPEARANCEUR Clear 10/28/2013  0457   LABSPEC 1.004 (L) 10/03/2019 2053   LABSPEC 1.005 10/28/2013 0457   PHURINE 6.0 10/03/2019 2053   GLUCOSEU NEGATIVE 10/03/2019 2053   GLUCOSEU 50 mg/dL 78/29/562108/05/2013 30860457   HGBUR MODERATE (A) 10/03/2019 2053   BILIRUBINUR NEGATIVE 10/03/2019 2053   BILIRUBINUR Negative 10/28/2013 0457   KETONESUR NEGATIVE 10/03/2019 2053   PROTEINUR NEGATIVE 10/03/2019 2053   NITRITE POSITIVE (A) 10/03/2019 2053   LEUKOCYTESUR TRACE (A) 10/03/2019 2053   LEUKOCYTESUR 3+ 10/28/2013 0457   Sepsis Labs: @LABRCNTIP (procalcitonin:4,lacticidven:4) ) Recent Results (from the past 240 hour(s))  SARS Coronavirus 2 by RT PCR (hospital order, [facility]) Nasopharyngeal Nasopharyngeal Swab     Status: None   Collection Time: 10/03/19  8:36 PM   Specimen: Nasopharyngeal Swab  Result Value Ref Range Status   SARS Coronavirus 2 NEGATIVE NEGATIVE Final    Comment: (NOTE) SARS-CoV-2 target nucleic acids are NOT DETECTED.  The SARS-CoV-2 RNA is generally detectable in upper and lower respiratory specimens during the acute phase of infection. The lowest concentration of SARS-CoV-2 viral copies this assay can detect is 250 copies / mL. A negative result does not preclude SARS-CoV-2 infection and should not be used as the sole basis for treatment or other patient management decisions.  A negative result may occur with improper specimen collection / handling, submission of specimen other than nasopharyngeal swab, presence of viral mutation(s) within the areas targeted by this assay, and inadequate number of viral copies (<250 copies / mL). A negative result must be combined with clinical observations, patient history, and epidemiological information.  Fact Sheet for Patients:   BoilerBrush.com.cy  Fact Sheet for Healthcare Providers: https://pope.com/  This test is not yet approved or  cleared by the [place] FDA and has been  authorized for detection and/or diagnosis of SARS-CoV-2 by FDA under an Emergency Use Authorization (EUA).  This EUA will remain in effect (meaning this test can be used) for the duration of the COVID-19 declaration under Section 564(b)(1) of the Act, 21 U.S.C. section 360bbb-3(b)(1), unless the authorization is terminated or revoked sooner.  Performed at [facility], [address] [place], [place] [address]   MRSA PCR Screening     Status: None   Collection Time: 10/03/19 11:07 PM   Specimen: Nasal Mucosa; Nasopharyngeal  Result Value Ref Range Status   MRSA by PCR NEGATIVE NEGATIVE Final    Comment:        The GeneXpert MRSA Assay (FDA approved for NASAL specimens only), is one component of a comprehensive MRSA colonization surveillance program. It is not intended to diagnose MRSA infection nor to guide or monitor treatment for MRSA infections. Performed at [facility], [address] [place], [place] [address]   Culture, Urine     Status: None   Collection Time: 10/06/19  4:00 PM   Specimen: Urine, Clean Catch  Result Value Ref Range Status   Specimen Description   Final    URINE, CLEAN CATCH Performed at [facility], [address], [place], [place] [address]    Special Requests   Final    NONE Performed at [facility], [address] [place], [place] [address]    Culture   Final    NO GROWTH Performed at [facility], [address] [place], [place] [address]    Report Status 10/08/2019 FINAL  Final     Scheduled Meds: . apixaban  5 mg Oral BID  . carvedilol  3.125 mg Oral BID WC  . Chlorhexidine Gluconate Cloth  6 each Topical Daily  . docusate sodium  200 mg Oral Daily  . insulin aspart  0-15 Units Subcutaneous TID WC  . latanoprost  1 drop Both Eyes QHS  . loratadine  10 mg Oral Daily  . OLANZapine  10 mg Oral QHS  . pantoprazole  40 mg Oral Daily  . risperiDONE  1 mg Oral QHS  . senna-docusate  2 tablet Oral QHS  . traZODone  50 mg Oral QHS   . Vitamin D (Ergocalciferol)  50,000 Units Oral Q Mon   Continuous Infusions: . sodium chloride Stopped (10/07/19 1632)    Procedures/Studies: DG Chest 1 View  Result Date: 10/04/2019 CLINICAL DATA:  Leukocytosis. EXAM: CHEST  1 VIEW COMPARISON:  Chest x-ray 06/13/2018. FINDINGS: Mediastinum hilar structures normal. Low lung volumes. Mild right base subsegmental atelectasis. No pleural effusion or pneumothorax. Degenerative changes scoliosis thoracic spine. IMPRESSION: Low  lung volumes with mild right base subsegmental atelectasis. Electronically Signed   By: Maisie Fus  Register   On: 10/04/2019 10:27   CT HEAD WO CONTRAST  Result Date: 10/04/2019 CLINICAL DATA:  Encephalopathy. EXAM: CT HEAD WITHOUT CONTRAST TECHNIQUE: Contiguous axial images were obtained from the base of the skull through the vertex without intravenous contrast. COMPARISON:  Head CT 10/28/2013, report from head CT 04/02/2018 (images unavailable). FINDINGS: Brain: Mild generalized parenchymal atrophy. No hyperdense vessel. No demarcated cortical infarct. No extra-axial fluid collection. No evidence of intracranial mass. No midline shift. Vascular: No hyperdense vessel. Skull: Normal. Negative for fracture or focal lesion. Sinuses/Orbits: Visualized orbits show no acute finding. Small right maxillary sinus mucous retention cyst. Mild ethmoid sinus mucosal thickening. No significant mastoid effusion. IMPRESSION: No CT evidence of acute intracranial abnormality. Mild generalized parenchymal atrophy, stable. Mild ethmoid sinus mucosal thickening. Small right maxillary sinus mucous retention cyst. Electronically Signed   By: Jackey Loge DO   On: 10/04/2019 11:32   ECHOCARDIOGRAM COMPLETE  Result Date: 10/06/2019    ECHOCARDIOGRAM REPORT   Patient Name:   [name] Date of Exam: 10/06/2019 Medical Rec #:  [id number]      Height:       60.0 in Accession #:    [id number]     Weight:       200.6 lb Date of Birth:  08/24/1940       BSA:           1.869 m Patient Age:    78 years       BP:           110/88 mmHg Patient Gender: F              HR:           70 bpm. [facility] Procedure: 2D Echo, Cardiac Doppler and Color Doppler                            MODIFIED REPORT: This report was modified by Lennie Odor MD on 10/06/2019 due to rvsp.  Indications:     Atrial Fibrillation 427.31 / I48.91  History:         Patient has no prior history of Echocardiogram examinations.                  Risk Factors:Hypertension, Diabetes and Dyslipidemia.                  Schizophrenia,Irregular heart beat (From Hx),GERD.  Sonographer:     Celesta Gentile RCS Referring Phys:  [id number] Cleora Fleet Diagnosing Phys: Lennie Odor MD IMPRESSIONS  1. Left ventricular ejection fraction, by estimation, is 30 to 35%. The left ventricle has moderately decreased function. The left ventricle demonstrates global hypokinesis. The left ventricular internal cavity size was mildly dilated. Left ventricular diastolic parameters are consistent with Grade II diastolic dysfunction (pseudonormalization). Elevated left atrial pressure.  2. Right ventricular systolic function is normal. The right ventricular size is normal. There is normal pulmonary artery systolic pressure. The estimated right ventricular systolic pressure is 24.0 mmHg.  3. Left atrial size was mildly dilated.  4. The mitral valve is grossly normal. Mild to moderate mitral valve regurgitation. No evidence of mitral stenosis.  5. The aortic valve is tricuspid. Aortic valve regurgitation is trivial. No aortic stenosis is present.  6. The inferior vena cava is normal in size with greater than 50%  respiratory variability, suggesting right atrial pressure of 3 mmHg. FINDINGS  Left Ventricle: Left ventricular ejection fraction, by estimation, is 30 to 35%. The left ventricle has moderately decreased function. The left ventricle demonstrates global hypokinesis. The left ventricular internal cavity size was mildly  dilated. There is no left ventricular hypertrophy. Left ventricular diastolic parameters are consistent with Grade II diastolic dysfunction (pseudonormalization). Elevated left atrial pressure. Right Ventricle: The right ventricular size is normal. No increase in right ventricular wall thickness. Right ventricular systolic function is normal. There is normal pulmonary artery systolic pressure. The tricuspid regurgitant velocity is 2.29 m/s, and  with an assumed right atrial pressure of 3 mmHg, the estimated right ventricular systolic pressure is 24.0 mmHg. Left Atrium: Left atrial size was mildly dilated. Right Atrium: Right atrial size was normal in size. Pericardium: There is no evidence of pericardial effusion. Mitral Valve: The mitral valve is grossly normal. Mild to moderate mitral valve regurgitation. No evidence of mitral valve stenosis. Tricuspid Valve: The tricuspid valve is grossly normal. Tricuspid valve regurgitation is mild . No evidence of tricuspid stenosis. Aortic Valve: The aortic valve is tricuspid. Aortic valve regurgitation is trivial. Aortic regurgitation PHT measures 671 msec. No aortic stenosis is present. Pulmonic Valve: The pulmonic valve was grossly normal. Pulmonic valve regurgitation is trivial. No evidence of pulmonic stenosis. Aorta: The aortic root is normal in size and structure. Venous: The inferior vena cava is normal in size with greater than 50% respiratory variability, suggesting right atrial pressure of 3 mmHg. IAS/Shunts: The atrial septum is grossly normal.  LEFT VENTRICLE PLAX 2D LVIDd:         5.66 cm     Diastology LVIDs:         4.61 cm     LV e' lateral:   8.38 cm/s LV PW:         1.07 cm     LV E/e' lateral: 10.1 LV IVS:        1.10 cm     LV e' medial:    4.68 cm/s LVOT diam:     2.00 cm     LV E/e' medial:  18.0 LV SV:         52 LV SV Index:   28 LVOT Area:     3.14 cm  LV Volumes (MOD) LV vol d, MOD A2C: 91.2 ml LV vol d, MOD A4C: 99.1 ml LV vol s, MOD A2C: 49.9 ml  LV vol s, MOD A4C: 62.7 ml LV SV MOD A2C:     41.3 ml LV SV MOD A4C:     99.1 ml LV SV MOD BP:      37.9 ml RIGHT VENTRICLE RV S prime:     9.79 cm/s TAPSE (M-mode): 2.0 cm LEFT ATRIUM             Index       RIGHT ATRIUM           Index LA diam:        4.15 cm 2.22 cm/m  RA Area:     11.30 cm LA Vol (A2C):   65.1 ml 34.83 ml/m RA Volume:   25.10 ml  13.43 ml/m LA Vol (A4C):   65.5 ml 35.04 ml/m LA Biplane Vol: 65.3 ml 34.94 ml/m  AORTIC VALVE LVOT Vmax:   99.40 cm/s LVOT Vmean:  64.000 cm/s LVOT VTI:    0.166 m AI PHT:      671 msec  AORTA Ao Root diam: 3.00  cm MITRAL VALVE                 TRICUSPID VALVE MV Area (PHT): 3.54 cm      TR Peak grad:   21.0 mmHg MV Decel Time: 214 msec      TR Vmax:        229.00 cm/s MR Peak grad:    91.4 mmHg MR Mean grad:    41.0 mmHg   SHUNTS MR Vmax:         478.00 cm/s Systemic VTI:  0.17 m MR Vmean:        287.0 cm/s  Systemic Diam: 2.00 cm MR PISA:         1.01 cm MR PISA Eff ROA: 7 mm MR PISA Radius:  0.40 cm MV E velocity: 84.30 cm/s MV A velocity: 76.40 cm/s MV E/A ratio:  1.10 Lennie Odor MD Electronically signed by Lennie Odor MD Signature Date/Time: 10/06/2019/4:54:39 PM    Final (Updated)     Catarina Hartshorn, DO  [facility]  If 7PM-7AM, please contact night-coverage [email or web address] Password [facility] 10/09/2019, 9:55 AM   LOS: 5 days

## 2019-10-10 ENCOUNTER — Inpatient Hospital Stay (HOSPITAL_COMMUNITY): Payer: Medicare Other

## 2019-10-10 LAB — GLUCOSE, CAPILLARY
Glucose-Capillary: 125 mg/dL — ABNORMAL HIGH (ref 70–99)
Glucose-Capillary: 252 mg/dL — ABNORMAL HIGH (ref 70–99)
Glucose-Capillary: 108 mg/dL — ABNORMAL HIGH (ref 70–99)
Glucose-Capillary: 147 mg/dL — ABNORMAL HIGH (ref 70–99)

## 2019-10-10 LAB — BASIC METABOLIC PANEL
Anion gap: 7 (ref 5–15)
BUN: 22 mg/dL (ref 8–23)
CO2: 24 mmol/L (ref 22–32)
Calcium: 8.8 mg/dL — ABNORMAL LOW (ref 8.9–10.3)
Chloride: 103 mmol/L (ref 98–111)
Creatinine, Ser: 1.24 mg/dL — ABNORMAL HIGH (ref 0.44–1.00)
GFR calc Af Amer: 49 mL/min — ABNORMAL LOW (ref >60)
GFR calc non Af Amer: 42 mL/min — ABNORMAL LOW (ref >60)
Glucose, Bld: 124 mg/dL — ABNORMAL HIGH (ref 70–99)
Potassium: 4.2 mmol/L (ref 3.5–5.1)
Sodium: 134 mmol/L — ABNORMAL LOW (ref 135–145)

## 2019-10-10 LAB — CBC
HCT: 28.8 % — ABNORMAL LOW (ref 36.0–46.0)
Hemoglobin: 9 g/dL — ABNORMAL LOW (ref 12.0–15.0)
MCH: 27.5 pg (ref 26.0–34.0)
MCHC: 31.3 g/dL (ref 30.0–36.0)
MCV: 88.1 fL (ref 80.0–100.0)
Platelets: 266 10*3/uL (ref 150–400)
RBC: 3.27 MIL/uL — ABNORMAL LOW (ref 3.87–5.11)
RDW: 14.4 % (ref 11.5–15.5)
WBC: 10.3 10*3/uL (ref 4.0–10.5)
nRBC: 0 % (ref 0.0–0.2)

## 2019-10-10 LAB — LIPID PANEL
Cholesterol: 168 mg/dL (ref 0–200)
HDL: 37 mg/dL — ABNORMAL LOW (ref >40)
LDL Cholesterol: 120 mg/dL — ABNORMAL HIGH (ref 0–99)
Total CHOL/HDL Ratio: 4.5 RATIO
Triglycerides: 54 mg/dL (ref <150)
VLDL: 11 mg/dL (ref 0–40)

## 2019-10-10 LAB — MAGNESIUM: Magnesium: 1.8 mg/dL (ref 1.7–2.4)

## 2019-10-10 MED ORDER — CYANOCOBALAMIN 500 MCG PO TABS: 500.0000 ug | ORAL_TABLET | Freq: Every day | ORAL | Status: DC

## 2019-10-10 MED ORDER — VITAMIN D (ERGOCALCIFEROL) 1.25 MG (50000 UNIT) PO CAPS: 50000.0000 [IU] | ORAL_CAPSULE | ORAL | Status: DC

## 2019-10-10 MED ORDER — ATORVASTATIN CALCIUM 20 MG PO TABS: 20.0000 mg | ORAL_TABLET | Freq: Every day | ORAL | Status: DC

## 2019-10-10 MED ORDER — METOPROLOL TARTRATE 25 MG PO TABS: 25.0000 mg | ORAL_TABLET | Freq: Two times a day (BID) | ORAL | Status: DC

## 2019-10-10 MED ORDER — LISINOPRIL 2.5 MG PO TABS: 20.0000 mg | ORAL_TABLET | Freq: Every day | ORAL | Status: DC

## 2019-10-10 MED ORDER — ATORVASTATIN CALCIUM 20 MG PO TABS
20.0000 mg | ORAL_TABLET | Freq: Every day | ORAL | Status: DC
  Administered 2019-10-10 – 2019-10-11 (×2): 20 mg via ORAL
  Filled 2019-10-10 (×2): qty 1

## 2019-10-10 MED ORDER — APIXABAN 5 MG PO TABS: 5.0000 mg | ORAL_TABLET | Freq: Two times a day (BID) | ORAL | Status: DC

## 2019-10-10 MED ORDER — LISINOPRIL 2.5 MG PO TABS: 2.5000 mg | ORAL_TABLET | Freq: Every day | ORAL | Status: DC

## 2019-10-10 MED ORDER — ATORVASTATIN CALCIUM 40 MG PO TABS: 40.0000 mg | ORAL_TABLET | Freq: Every day | ORAL | Status: DC

## 2019-10-10 NOTE — TOC Progression Note (Signed)
Transition of Care [facility]) - Progression Note    Patient Details  Name: Maria Pace MRN: [id number] Date of Birth: 12/20/1940  [facility] CM/SW Contact  Elliot Gault, [name] Phone Number: 10/10/2019, 11:55 AM  Clinical Narrative:     TOC following. Per MD, pt stable for dc. Notes indicate pt should have bed available at [facility]. TTS disposition team working on transfer of pt. TOC will be available if needed.  Expected Discharge Plan: Rest Home Barriers to Discharge: Continued Medical Work up  Expected Discharge Plan and Services Expected Discharge Plan: Rest Home In-house Referral: Clinical Social Work     Living arrangements for the past 2 months: Group Home Expected Discharge Date: 10/10/19                                     Social Determinants of Health (SDOH) Interventions    Readmission Risk Interventions No flowsheet data found.

## 2019-10-10 NOTE — Discharge Summary (Signed)
Physician Discharge Summary  Maria Pace MPN:[id number] DOB: 1940/06/02 DOA: 10/03/2019  PCP: Avon Gully, MD  Admit date: 10/03/2019 Discharge date: 10/10/2019  Admitted From: ALF Disposition:  [facility]    Recommendations for Outpatient Follow-up:  1. Follow up with PCP in 1-2 weeks 2. Please obtain BMP/CBC in one week   Home Health: [facility]  Discharge Condition: Stable CODE STATUS: FULL Diet recommendation: Heart Healthy    Brief/Interim Summary: 79 y.o.femalewith medical history significant ofbipolar 1 disorder, schizophrenia, essential hypertension, GERD, hyperlipidemia, history of irregular heartbeat, type 2 diabetes mellitus who is brought from her assisted living facility due to becoming confused, locking herself in her room, then being found lying down on the floor with a broken bed, the TV knocked off the TV stand and the patient stating that everything in the roomthat hadcolor needed to be removed by the staffbecause anything with colorcontain the devil. The staff from the facility suspects that the patient has not been taking her medications. She has some trouble elaborating about what has happened over the past few days. It seems that she has not been eating or drinking properly. She seems to be in no distress at the moment and she is able to answer simple questions and denies having headache, chest pain, dyspnea, abdominal or joint pain at this time.  Discharge Diagnoses:   Acute metabolic encephalopathy -Multifactorial including hyponatremia, acute kidney injury, medication induced and low B12 -supplement B12 - patient "plays possum" intermittently -pt refuses to speak and interact, but has been seen to move all 4 extremities antigravity and wakes up when staff leaves the room -later in day 10/09/19 patient seen walking halls with PT without difficulty and interactive and A&O x3 -MR brain--punctate acute/early subacute infarct R-frontal  lobe/precentral gyrus -10/10/19--A&O x 3 and following commands  Acute ischemic stroke -PT/OT evaluation-->[facility] -Speech therapy eval -CT brain--neg acute -MRI brain---punctate acute/early subacute infarct R-frontal lobe/precentral gyrus -MRA brain--no LVO -Carotid Duplex--no hemodynamically significant stenosis -Echo--EF 30-35%, G2DD, mild TR, mild to mod MR -LDL--120 -HbA1C--6.3 -Antiplatelet--none-->using apixaban -discussed with neurology, Dr. Leone Haven need to transfer to [facility].  Continue apixaban.  Adding ASA at this time will increase risk of bleed unless pt previously on it for CAD.  Acute kidney injury -Baseline creatinine 1.0-1.3 -Serum creatinine peaked 2.30 -Secondary to volume depletion -Initially placed on IV fluids -improved with IVF -serum creatinine 1.24 on day of d/c -Renal ultrasound--neg for hydronephrosis  Paroxysmal atrial fibrillation -Discontinue diltiazem in the setting of suppressed EF -10/06/2019 echo EF 30-35%, grade 2 DD, mild to moderate MR, mild TR -Restart metoprolol at lower dose due to soft BPs -continue apixaban -rate controlled -TSH 1.481  Hyponatremia -Review of medical records shows that the patient has a history of chronic hyponatremia with sodium ranging 128-131  -secondary to volume depletion, HCTZ and poor solute intake -Restart IV fluids-->improved -Na 134 on day of d/c  Essential HTN -D/c losartan, hydralazine, diltiazem, spironolactone -decrease dose of metoprolol  Chronic combined systolic and diastolic CHF -10/06/19 Echo EF 00-86%, G2DD, mild TR, Mild to mod MR -continue metoprolol -appears euvolemic -start low dose lisinopril after d/c -repeat BMP in one week  DM2 -10/03/19 A1C--7.8 -continue novolog sliding scale -restart oral agents at time of d/c  Hypomagnesemia -repleted  Schizophrenia -continue zyprexa, risperdal, trazodone  Vitamin D deficiency -continue drisdol  Mixed  hyperlipidemia -increased lipitor to 40 mg daily  Discharge Instructions   Allergies as of 10/10/2019      Reactions   Haloperidol Lactate  Other (See Comments)   Couldn't urinate anymore      Medication List    STOP taking these medications   bisacodyl 10 MG suppository Commonly known as: Dulcolax   hydrochlorothiazide 12.5 MG capsule Commonly known as: MICROZIDE     TAKE these medications   acetaminophen 500 MG tablet Commonly known as: TYLENOL Take 500 mg by mouth every 8 (eight) hours as needed for mild pain or moderate pain.   apixaban 5 MG Tabs tablet Commonly known as: ELIQUIS Take 1 tablet (5 mg total) by mouth 2 (two) times daily.   atorvastatin 40 MG tablet Commonly known as: LIPITOR Take 1 tablet (40 mg total) by mouth daily. What changed:   medication strength  how much to take   docusate sodium 100 MG capsule Commonly known as: COLACE Take 200 mg by mouth daily.   latanoprost 0.005 % ophthalmic solution Commonly known as: XALATAN Place 1 drop into both eyes at bedtime.   linagliptin 5 MG Tabs tablet Commonly known as: TRADJENTA Take 5 mg by mouth daily.   lisinopril 2.5 MG tablet Commonly known as: ZESTRIL Take 1 tablet (2.5 mg total) by mouth daily. Start taking on: October 12, 2019 What changed:   medication strength  how much to take  These instructions start on October 12, 2019. If you are unsure what to do until then, ask your doctor or other care provider.   loratadine 10 MG tablet Commonly known as: CLARITIN Take 10 mg by mouth daily.   metFORMIN 500 MG tablet Commonly known as: GLUCOPHAGE Take 500 mg by mouth 2 (two) times daily with a meal.   metoprolol tartrate 25 MG tablet Commonly known as: LOPRESSOR Take 1 tablet (25 mg total) by mouth 2 (two) times daily. What changed:   medication strength  how much to take   OLANZapine 10 MG tablet Commonly known as: ZYPREXA Take 1 tablet (10 mg total) by mouth at bedtime.    pantoprazole 40 MG tablet Commonly known as: PROTONIX Take 40 mg by mouth daily.   polyethylene glycol powder 17 GM/SCOOP powder Commonly known as: GLYCOLAX/MIRALAX Take 17 g by mouth daily as needed for mild constipation or moderate constipation.   risperiDONE 1 MG tablet Commonly known as: RISPERDAL Take 1 mg by mouth at bedtime.   senna-docusate 8.6-50 MG tablet Commonly known as: Senokot-S Take 2 tablets by mouth at bedtime.   traZODone 50 MG tablet Commonly known as: DESYREL Take 1 tablet (50 mg total) by mouth at bedtime.   Vicks VapoRub 4.7-1.2-2.6 % Oint Apply 1 application topically daily. Apply once to toenails daily   vitamin B-12 500 MCG tablet Commonly known as: CYANOCOBALAMIN Take 1 tablet (500 mcg total) by mouth daily.   Vitamin D (Ergocalciferol) 1.25 MG (50000 UNIT) Caps capsule Commonly known as: DRISDOL Take 1 capsule (50,000 Units total) by mouth every Monday. Start taking on: October 14, 2019       Allergies  Allergen Reactions  . Haloperidol Lactate Other (See Comments)    Couldn't urinate anymore    Consultations:  psychiatry   Procedures/Studies: DG Chest 1 View  Result Date: 10/04/2019 CLINICAL DATA:  Leukocytosis. EXAM: CHEST  1 VIEW COMPARISON:  Chest x-ray 06/13/2018. FINDINGS: Mediastinum hilar structures normal. Low lung volumes. Mild right base subsegmental atelectasis. No pleural effusion or pneumothorax. Degenerative changes scoliosis thoracic spine. IMPRESSION: Low lung volumes with mild right base subsegmental atelectasis. Electronically Signed   By: Maisie Fus  Register   On: 10/04/2019 10:27  CT HEAD WO CONTRAST  Result Date: 10/04/2019 CLINICAL DATA:  Encephalopathy. EXAM: CT HEAD WITHOUT CONTRAST TECHNIQUE: Contiguous axial images were obtained from the base of the skull through the vertex without intravenous contrast. COMPARISON:  Head CT 10/28/2013, report from head CT 04/02/2018 (images unavailable). FINDINGS: Brain: Mild  generalized parenchymal atrophy. No hyperdense vessel. No demarcated cortical infarct. No extra-axial fluid collection. No evidence of intracranial mass. No midline shift. Vascular: No hyperdense vessel. Skull: Normal. Negative for fracture or focal lesion. Sinuses/Orbits: Visualized orbits show no acute finding. Small right maxillary sinus mucous retention cyst. Mild ethmoid sinus mucosal thickening. No significant mastoid effusion. IMPRESSION: No CT evidence of acute intracranial abnormality. Mild generalized parenchymal atrophy, stable. Mild ethmoid sinus mucosal thickening. Small right maxillary sinus mucous retention cyst. Electronically Signed   By: Jackey Loge DO   On: 10/04/2019 11:32   MR BRAIN WO CONTRAST  Result Date: 10/09/2019 CLINICAL DATA:  Encephalopathy. Additional history provided: Confusion. EXAM: MRI HEAD WITHOUT CONTRAST TECHNIQUE: Multiplanar, multiecho pulse sequences of the brain and surrounding structures were obtained without intravenous contrast. COMPARISON:  Head CT 10/04/2019, head CT 10/28/2013 FINDINGS: Brain: The patient was unable to tolerate the full examination. As a result, the routine axial T1 weighted sequence and coronal T2 weighted sequence could not be obtained. There is motion degradation of the acquired sequences. Most notably, there is mild-to-moderate motion degradation of the axial T2/FLAIR sequence. This limits evaluation. Mild generalized parenchymal atrophy. There is a punctate focus of restricted diffusion within the right frontal lobe precentral gyrus (series 4, image 96) (series 5, image 41). Minimal ill-defined and scattered T2/FLAIR hyperintensity within the cerebral white matter is nonspecific, but likely reflects minimal changes of chronic small vessel ischemia. No evidence of intracranial mass. No chronic intracranial blood products. No extra-axial fluid collection. No midline shift. Vascular: Expected proximal arterial flow voids. Skull and upper  cervical spine: No focal marrow lesion. Sinuses/Orbits: Visualized orbits show no acute finding. Mild ethmoid and right maxillary sinus mucosal thickening. No significant mastoid effusion. IMPRESSION: Prematurely terminated and motion degraded examination as described. The diffusion-weighted imaging is of good quality however. Punctate acute/early subacute infarct within the right frontal lobe precentral gyrus. Mild generalized parenchymal atrophy and chronic small vessel ischemic disease. Mild ethmoid and right maxillary sinus mucosal thickening. Electronically Signed   By: Jackey Loge DO   On: 10/09/2019 14:42   US RENAL  Result Date: 10/09/2019 CLINICAL DATA:  Acute renal insufficiency EXAM: RENAL / URINARY TRACT ULTRASOUND COMPLETE COMPARISON:  02/22/2017 FINDINGS: Right Kidney: Renal measurements: 10.8 x 5.2 x 5.0 cm = volume: 148 mL. There is a 1.9 x 1.9 by 1.4 cm complex cyst lower pole right kidney, incompletely characterized by this study. Dedicated renal MRI recommended on a nonemergent basis. Right renal cortical echotexture is unremarkable. Left Kidney: Renal measurements: 10.9 x 4.9 x 4.9 cm = volume: 136 mL. Echogenicity within normal limits. No mass or hydronephrosis visualized. Bladder: Appears normal for degree of bladder distention. Other: None. IMPRESSION: 1. 1.9 cm complex right renal cyst. Dedicated renal MRI recommended for complete characterization. 2. Otherwise unremarkable appearance of the kidneys. Electronically Signed   By: Sharlet Salina M.D.   On: 10/09/2019 16:03   ECHOCARDIOGRAM COMPLETE  Result Date: 10/06/2019    ECHOCARDIOGRAM REPORT   Patient Name:   [name] Date of Exam: 10/06/2019 Medical Rec #:  [id number]      Height:       60.0 in Accession #:  [id number]     Weight:       200.6 lb Date of Birth:  08-26-1940       BSA:          1.869 m Patient Age:    78 years       BP:           110/88 mmHg Patient Gender: F              HR:           70 bpm. [facility] Procedure: 2D Echo, Cardiac Doppler and Color Doppler                            MODIFIED REPORT: This report was modified by Lennie Odor MD on 10/06/2019 due to rvsp.  Indications:     Atrial Fibrillation 427.31 / I48.91  History:         Patient has no prior history of Echocardiogram examinations.                  Risk Factors:Hypertension, Diabetes and Dyslipidemia.                  Schizophrenia,Irregular heart beat (From Hx),GERD.  Sonographer:     Celesta Gentile RCS Referring Phys:  [id number] Cleora Fleet Diagnosing Phys: Lennie Odor MD IMPRESSIONS  1. Left ventricular ejection fraction, by estimation, is 30 to 35%. The left ventricle has moderately decreased function. The left ventricle demonstrates global hypokinesis. The left ventricular internal cavity size was mildly dilated. Left ventricular diastolic parameters are consistent with Grade II diastolic dysfunction (pseudonormalization). Elevated left atrial pressure.  2. Right ventricular systolic function is normal. The right ventricular size is normal. There is normal pulmonary artery systolic pressure. The estimated right ventricular systolic pressure is 24.0 mmHg.  3. Left atrial size was mildly dilated.  4. The mitral valve is grossly normal. Mild to moderate mitral valve regurgitation. No evidence of mitral stenosis.  5. The aortic valve is tricuspid. Aortic valve regurgitation is trivial. No aortic stenosis is present.  6. The inferior vena cava is normal in size with greater than 50% respiratory variability, suggesting right atrial pressure of 3 mmHg. FINDINGS  Left Ventricle: Left ventricular ejection fraction, by estimation, is 30 to 35%. The left ventricle has moderately decreased function. The left ventricle demonstrates global hypokinesis. The left ventricular internal cavity size was mildly dilated. There is no left ventricular hypertrophy. Left ventricular diastolic parameters are consistent with Grade II diastolic dysfunction  (pseudonormalization). Elevated left atrial pressure. Right Ventricle: The right ventricular size is normal. No increase in right ventricular wall thickness. Right ventricular systolic function is normal. There is normal pulmonary artery systolic pressure. The tricuspid regurgitant velocity is 2.29 m/s, and  with an assumed right atrial pressure of 3 mmHg, the estimated right ventricular systolic pressure is 24.0 mmHg. Left Atrium: Left atrial size was mildly dilated. Right Atrium: Right atrial size was normal in size. Pericardium: There is no evidence of pericardial effusion. Mitral Valve: The mitral valve is grossly normal. Mild to moderate mitral valve regurgitation. No evidence of mitral valve stenosis. Tricuspid Valve: The tricuspid valve is grossly normal. Tricuspid valve regurgitation is mild . No evidence of tricuspid stenosis. Aortic Valve: The aortic valve is tricuspid. Aortic valve regurgitation is trivial. Aortic regurgitation PHT measures 671 msec. No aortic stenosis is present. Pulmonic Valve: The pulmonic valve was grossly normal. Pulmonic  valve regurgitation is trivial. No evidence of pulmonic stenosis. Aorta: The aortic root is normal in size and structure. Venous: The inferior vena cava is normal in size with greater than 50% respiratory variability, suggesting right atrial pressure of 3 mmHg. IAS/Shunts: The atrial septum is grossly normal.  LEFT VENTRICLE PLAX 2D LVIDd:         5.66 cm     Diastology LVIDs:         4.61 cm     LV e' lateral:   8.38 cm/s LV PW:         1.07 cm     LV E/e' lateral: 10.1 LV IVS:        1.10 cm     LV e' medial:    4.68 cm/s LVOT diam:     2.00 cm     LV E/e' medial:  18.0 LV SV:         52 LV SV Index:   28 LVOT Area:     3.14 cm  LV Volumes (MOD) LV vol d, MOD A2C: 91.2 ml LV vol d, MOD A4C: 99.1 ml LV vol s, MOD A2C: 49.9 ml LV vol s, MOD A4C: 62.7 ml LV SV MOD A2C:     41.3 ml LV SV MOD A4C:     99.1 ml LV SV MOD BP:      37.9 ml RIGHT VENTRICLE RV S prime:      9.79 cm/s TAPSE (M-mode): 2.0 cm LEFT ATRIUM             Index       RIGHT ATRIUM           Index LA diam:        4.15 cm 2.22 cm/m  RA Area:     11.30 cm LA Vol (A2C):   65.1 ml 34.83 ml/m RA Volume:   25.10 ml  13.43 ml/m LA Vol (A4C):   65.5 ml 35.04 ml/m LA Biplane Vol: 65.3 ml 34.94 ml/m  AORTIC VALVE LVOT Vmax:   99.40 cm/s LVOT Vmean:  64.000 cm/s LVOT VTI:    0.166 m AI PHT:      671 msec  AORTA Ao Root diam: 3.00 cm MITRAL VALVE                 TRICUSPID VALVE MV Area (PHT): 3.54 cm      TR Peak grad:   21.0 mmHg MV Decel Time: 214 msec      TR Vmax:        229.00 cm/s MR Peak grad:    91.4 mmHg MR Mean grad:    41.0 mmHg   SHUNTS MR Vmax:         478.00 cm/s Systemic VTI:  0.17 m MR Vmean:        287.0 cm/s  Systemic Diam: 2.00 cm MR PISA:         1.01 cm MR PISA Eff ROA: 7 mm MR PISA Radius:  0.40 cm MV E velocity: 84.30 cm/s MV A velocity: 76.40 cm/s MV E/A ratio:  1.10 Lennie Odor MD Electronically signed by Lennie Odor MD Signature Date/Time: 10/06/2019/4:54:39 PM    Final (Updated)         Discharge Exam: Vitals:   10/09/19 2135 10/10/19 0510  BP: 138/79 114/63  Pulse: (!) 108 (!) 53  Resp: 20 18  Temp: 99 F (37.2 C) 98.3 F (36.8 C)  SpO2: 97% 93%   Vitals:   10/09/19  0654 10/09/19 1554 10/09/19 2135 10/10/19 0510  BP: (!) 87/56 125/76 138/79 114/63  Pulse: 60 83 (!) 108 (!) 53  Resp: 20 15 20 18   Temp:  98.1 F (36.7 C) 99 F (37.2 C) 98.3 F (36.8 C)  TempSrc:   Oral Oral  SpO2: 98% 98% 97% 93%  Weight:      Height:        General: Pt is alert, awake, not in acute distress Cardiovascular: RRR, S1/S2 +, no rubs, no gallops Respiratory: CTA bilaterally, no wheezing, no rhonchi Abdominal: Soft, NT, ND, bowel sounds + Extremities: no edema, no cyanosis Neuro:  CN II-XII intact, strength 4/5 in RUE, RLE, strength 4/5 LUE, LLE; sensation intact bilateral; no dysmetria; babinski equivocal    The results of significant diagnostics from this  hospitalization (including imaging, microbiology, ancillary and laboratory) are listed below for reference.    Significant Diagnostic Studies: DG Chest 1 View  Result Date: 10/04/2019 CLINICAL DATA:  Leukocytosis. EXAM: CHEST  1 VIEW COMPARISON:  Chest x-ray 06/13/2018. FINDINGS: Mediastinum hilar structures normal. Low lung volumes. Mild right base subsegmental atelectasis. No pleural effusion or pneumothorax. Degenerative changes scoliosis thoracic spine. IMPRESSION: Low lung volumes with mild right base subsegmental atelectasis. Electronically Signed   By: Maisie Fushomas  Register   On: 10/04/2019 10:27   CT HEAD WO CONTRAST  Result Date: 10/04/2019 CLINICAL DATA:  Encephalopathy. EXAM: CT HEAD WITHOUT CONTRAST TECHNIQUE: Contiguous axial images were obtained from the base of the skull through the vertex without intravenous contrast. COMPARISON:  Head CT 10/28/2013, report from head CT 04/02/2018 (images unavailable). FINDINGS: Brain: Mild generalized parenchymal atrophy. No hyperdense vessel. No demarcated cortical infarct. No extra-axial fluid collection. No evidence of intracranial mass. No midline shift. Vascular: No hyperdense vessel. Skull: Normal. Negative for fracture or focal lesion. Sinuses/Orbits: Visualized orbits show no acute finding. Small right maxillary sinus mucous retention cyst. Mild ethmoid sinus mucosal thickening. No significant mastoid effusion. IMPRESSION: No CT evidence of acute intracranial abnormality. Mild generalized parenchymal atrophy, stable. Mild ethmoid sinus mucosal thickening. Small right maxillary sinus mucous retention cyst. Electronically Signed   By: Jackey LogeKyle  Golden DO   On: 10/04/2019 11:32   MR BRAIN WO CONTRAST  Result Date: 10/09/2019 CLINICAL DATA:  Encephalopathy. Additional history provided: Confusion. EXAM: MRI HEAD WITHOUT CONTRAST TECHNIQUE: Multiplanar, multiecho pulse sequences of the brain and surrounding structures were obtained without intravenous contrast.  COMPARISON:  Head CT 10/04/2019, head CT 10/28/2013 FINDINGS: Brain: The patient was unable to tolerate the full examination. As a result, the routine axial T1 weighted sequence and coronal T2 weighted sequence could not be obtained. There is motion degradation of the acquired sequences. Most notably, there is mild-to-moderate motion degradation of the axial T2/FLAIR sequence. This limits evaluation. Mild generalized parenchymal atrophy. There is a punctate focus of restricted diffusion within the right frontal lobe precentral gyrus (series 4, image 96) (series 5, image 41). Minimal ill-defined and scattered T2/FLAIR hyperintensity within the cerebral white matter is nonspecific, but likely reflects minimal changes of chronic small vessel ischemia. No evidence of intracranial mass. No chronic intracranial blood products. No extra-axial fluid collection. No midline shift. Vascular: Expected proximal arterial flow voids. Skull and upper cervical spine: No focal marrow lesion. Sinuses/Orbits: Visualized orbits show no acute finding. Mild ethmoid and right maxillary sinus mucosal thickening. No significant mastoid effusion. IMPRESSION: Prematurely terminated and motion degraded examination as described. The diffusion-weighted imaging is of good quality however. Punctate acute/early subacute infarct within the right frontal lobe precentral  gyrus. Mild generalized parenchymal atrophy and chronic small vessel ischemic disease. Mild ethmoid and right maxillary sinus mucosal thickening. Electronically Signed   By: Jackey Loge DO   On: 10/09/2019 14:42   US RENAL  Result Date: 10/09/2019 CLINICAL DATA:  Acute renal insufficiency EXAM: RENAL / URINARY TRACT ULTRASOUND COMPLETE COMPARISON:  02/22/2017 FINDINGS: Right Kidney: Renal measurements: 10.8 x 5.2 x 5.0 cm = volume: 148 mL. There is a 1.9 x 1.9 by 1.4 cm complex cyst lower pole right kidney, incompletely characterized by this study. Dedicated renal MRI recommended  on a nonemergent basis. Right renal cortical echotexture is unremarkable. Left Kidney: Renal measurements: 10.9 x 4.9 x 4.9 cm = volume: 136 mL. Echogenicity within normal limits. No mass or hydronephrosis visualized. Bladder: Appears normal for degree of bladder distention. Other: None. IMPRESSION: 1. 1.9 cm complex right renal cyst. Dedicated renal MRI recommended for complete characterization. 2. Otherwise unremarkable appearance of the kidneys. Electronically Signed   By: Sharlet Salina M.D.   On: 10/09/2019 16:03   ECHOCARDIOGRAM COMPLETE  Result Date: 10/06/2019    ECHOCARDIOGRAM REPORT   Patient Name:   [name] Date of Exam: 10/06/2019 Medical Rec #:  [id number]      Height:       60.0 in Accession #:    [id number]     Weight:       200.6 lb Date of Birth:  04-09-1940       BSA:          1.869 m Patient Age:    78 years       BP:           110/88 mmHg Patient Gender: F              HR:           70 bpm. [facility] Procedure: 2D Echo, Cardiac Doppler and Color Doppler                            MODIFIED REPORT: This report was modified by Lennie Odor MD on 10/06/2019 due to rvsp.  Indications:     Atrial Fibrillation 427.31 / I48.91  History:         Patient has no prior history of Echocardiogram examinations.                  Risk Factors:Hypertension, Diabetes and Dyslipidemia.                  Schizophrenia,Irregular heart beat (From Hx),GERD.  Sonographer:     Celesta Gentile RCS Referring Phys:  [id number] Cleora Fleet Diagnosing Phys: Lennie Odor MD IMPRESSIONS  1. Left ventricular ejection fraction, by estimation, is 30 to 35%. The left ventricle has moderately decreased function. The left ventricle demonstrates global hypokinesis. The left ventricular internal cavity size was mildly dilated. Left ventricular diastolic parameters are consistent with Grade II diastolic dysfunction (pseudonormalization). Elevated left atrial pressure.  2. Right ventricular systolic function is  normal. The right ventricular size is normal. There is normal pulmonary artery systolic pressure. The estimated right ventricular systolic pressure is 24.0 mmHg.  3. Left atrial size was mildly dilated.  4. The mitral valve is grossly normal. Mild to moderate mitral valve regurgitation. No evidence of mitral stenosis.  5. The aortic valve is tricuspid. Aortic valve regurgitation is trivial. No aortic stenosis is present.  6. The inferior vena cava is  normal in size with greater than 50% respiratory variability, suggesting right atrial pressure of 3 mmHg. FINDINGS  Left Ventricle: Left ventricular ejection fraction, by estimation, is 30 to 35%. The left ventricle has moderately decreased function. The left ventricle demonstrates global hypokinesis. The left ventricular internal cavity size was mildly dilated. There is no left ventricular hypertrophy. Left ventricular diastolic parameters are consistent with Grade II diastolic dysfunction (pseudonormalization). Elevated left atrial pressure. Right Ventricle: The right ventricular size is normal. No increase in right ventricular wall thickness. Right ventricular systolic function is normal. There is normal pulmonary artery systolic pressure. The tricuspid regurgitant velocity is 2.29 m/s, and  with an assumed right atrial pressure of 3 mmHg, the estimated right ventricular systolic pressure is 24.0 mmHg. Left Atrium: Left atrial size was mildly dilated. Right Atrium: Right atrial size was normal in size. Pericardium: There is no evidence of pericardial effusion. Mitral Valve: The mitral valve is grossly normal. Mild to moderate mitral valve regurgitation. No evidence of mitral valve stenosis. Tricuspid Valve: The tricuspid valve is grossly normal. Tricuspid valve regurgitation is mild . No evidence of tricuspid stenosis. Aortic Valve: The aortic valve is tricuspid. Aortic valve regurgitation is trivial. Aortic regurgitation PHT measures 671 msec. No aortic stenosis is  present. Pulmonic Valve: The pulmonic valve was grossly normal. Pulmonic valve regurgitation is trivial. No evidence of pulmonic stenosis. Aorta: The aortic root is normal in size and structure. Venous: The inferior vena cava is normal in size with greater than 50% respiratory variability, suggesting right atrial pressure of 3 mmHg. IAS/Shunts: The atrial septum is grossly normal.  LEFT VENTRICLE PLAX 2D LVIDd:         5.66 cm     Diastology LVIDs:         4.61 cm     LV e' lateral:   8.38 cm/s LV PW:         1.07 cm     LV E/e' lateral: 10.1 LV IVS:        1.10 cm     LV e' medial:    4.68 cm/s LVOT diam:     2.00 cm     LV E/e' medial:  18.0 LV SV:         52 LV SV Index:   28 LVOT Area:     3.14 cm  LV Volumes (MOD) LV vol d, MOD A2C: 91.2 ml LV vol d, MOD A4C: 99.1 ml LV vol s, MOD A2C: 49.9 ml LV vol s, MOD A4C: 62.7 ml LV SV MOD A2C:     41.3 ml LV SV MOD A4C:     99.1 ml LV SV MOD BP:      37.9 ml RIGHT VENTRICLE RV S prime:     9.79 cm/s TAPSE (M-mode): 2.0 cm LEFT ATRIUM             Index       RIGHT ATRIUM           Index LA diam:        4.15 cm 2.22 cm/m  RA Area:     11.30 cm LA Vol (A2C):   65.1 ml 34.83 ml/m RA Volume:   25.10 ml  13.43 ml/m LA Vol (A4C):   65.5 ml 35.04 ml/m LA Biplane Vol: 65.3 ml 34.94 ml/m  AORTIC VALVE LVOT Vmax:   99.40 cm/s LVOT Vmean:  64.000 cm/s LVOT VTI:    0.166 m AI PHT:      671 msec  AORTA Ao Root diam: 3.00 cm MITRAL VALVE                 TRICUSPID VALVE MV Area (PHT): 3.54 cm      TR Peak grad:   21.0 mmHg MV Decel Time: 214 msec      TR Vmax:        229.00 cm/s MR Peak grad:    91.4 mmHg MR Mean grad:    41.0 mmHg   SHUNTS MR Vmax:         478.00 cm/s Systemic VTI:  0.17 m MR Vmean:        287.0 cm/s  Systemic Diam: 2.00 cm MR PISA:         1.01 cm MR PISA Eff ROA: 7 mm MR PISA Radius:  0.40 cm MV E velocity: 84.30 cm/s MV A velocity: 76.40 cm/s MV E/A ratio:  1.10 Lennie Odor MD Electronically signed by Lennie Odor MD Signature Date/Time:  10/06/2019/4:54:39 PM    Final (Updated)      Microbiology: Recent Results (from the past 240 hour(s))  SARS Coronavirus 2 by RT PCR (hospital order, performed in [facility] hospital lab) Nasopharyngeal Nasopharyngeal Swab     Status: None   Collection Time: 10/03/19  8:36 PM   Specimen: Nasopharyngeal Swab  Result Value Ref Range Status   SARS Coronavirus 2 NEGATIVE NEGATIVE Final    Comment: (NOTE) SARS-CoV-2 target nucleic acids are NOT DETECTED.  The SARS-CoV-2 RNA is generally detectable in upper and lower respiratory specimens during the acute phase of infection. The lowest concentration of SARS-CoV-2 viral copies this assay can detect is 250 copies / mL. A negative result does not preclude SARS-CoV-2 infection and should not be used as the sole basis for treatment or other patient management decisions.  A negative result may occur with improper specimen collection / handling, submission of specimen other than nasopharyngeal swab, presence of viral mutation(s) within the areas targeted by this assay, and inadequate number of viral copies (<250 copies / mL). A negative result must be combined with clinical observations, patient history, and epidemiological information.  Fact Sheet for Patients:   BoilerBrush.com.cy  Fact Sheet for Healthcare Providers: https://pope.com/  This test is not yet approved or  cleared by the [place] FDA and has been authorized for detection and/or diagnosis of SARS-CoV-2 by FDA under an Emergency Use Authorization (EUA).  This EUA will remain in effect (meaning this test can be used) for the duration of the COVID-19 declaration under Section 564(b)(1) of the Act, 21 U.S.C. section 360bbb-3(b)(1), unless the authorization is terminated or revoked sooner.  Performed at [facility], [address] [place], [place] [address]   MRSA PCR Screening     Status: None   Collection Time: 10/03/19  11:07 PM   Specimen: Nasal Mucosa; Nasopharyngeal  Result Value Ref Range Status   MRSA by PCR NEGATIVE NEGATIVE Final    Comment:        The GeneXpert MRSA Assay (FDA approved for NASAL specimens only), is one component of a comprehensive MRSA colonization surveillance program. It is not intended to diagnose MRSA infection nor to guide or monitor treatment for MRSA infections. Performed at [facility], [address], [place], [place] [address]   Culture, Urine     Status: None   Collection Time: 10/06/19  4:00 PM   Specimen: Urine, Clean Catch  Result Value Ref Range Status   Specimen Description   Final    URINE,  CLEAN CATCH Performed at [facility], [address], [place], [place] [address]    Special Requests   Final    NONE Performed at [facility], [address] [place], [place] [address]    Culture   Final    NO GROWTH Performed at [facility], [address] [place], [place] [address]    Report Status 10/08/2019 FINAL  Final     Labs: Basic Metabolic Panel: Recent Labs  Lab 10/06/19 0534 10/06/19 0534 10/07/19 0423 10/07/19 0423 10/08/19 0543 10/08/19 0543 10/09/19 0544 10/10/19 0642  NA 133*  --  134*  --  134*  --  132* 134*  K 3.9   < > 4.0   < > 4.0   < > 4.4 4.2  CL 101  --  101  --  100  --  98 103  CO2 24  --  24  --  24  --  24 24  GLUCOSE 126*  --  138*  --  211*  --  149* 124*  BUN 9  --  9  --  14  --  23 22  CREATININE 0.92  --  0.91  --  0.93  --  1.74* 1.24*  CALCIUM 8.7*  --  9.3  --  9.4  --  9.1 8.8*  MG 1.3*  --  1.8  --  1.5*  --  2.1 1.8   < > = values in this interval not displayed.   Liver Function Tests: Recent Labs  Lab 10/03/19 1807  AST 24  ALT 18  ALKPHOS 81  BILITOT 0.7  PROT 8.1  ALBUMIN 4.3  4.3   No results for input(s): LIPASE, AMYLASE in the last 168 hours. Recent Labs  Lab 10/09/19 1053  AMMONIA <9*   CBC: Recent Labs  Lab 10/06/19 0534 10/07/19 0423 10/08/19 0543 10/09/19 0544  10/10/19 0642  WBC 8.9 10.8* 11.9* 14.0* 10.3  HGB 9.4* 9.8* 10.4* 9.8* 9.0*  HCT 29.8* 30.3* 33.7* 31.5* 28.8*  MCV 88.4 86.6 88.2 88.0 88.1  PLT 254 274 285 285 266   Cardiac Enzymes: Recent Labs  Lab 10/03/19 1807 10/04/19 0448 10/05/19 0430 10/09/19 1053  CKTOTAL 381* 390* 286* 288*   BNP: Invalid input(s): POCBNP CBG: Recent Labs  Lab 10/09/19 1033 10/09/19 1129 10/09/19 1641 10/09/19 2132 10/10/19 0753  GLUCAP 111* 132* 138* 117* 125*    Time coordinating discharge:  36 minutes  Signed:  Catarina Hartshorn, DO [facility] Pager: [phone] 10/10/2019, 8:23 AM

## 2019-10-10 NOTE — Progress Notes (Signed)
Physical Therapy Treatment Patient Details Name: [name] MRN: [id number] DOB: 1941/01/03 Today's Date: 10/10/2019    History of Present Illness Maria Pace is a 79 y.o. female with medical history significant of bipolar 1 disorder, schizophrenia, essential hypertension, GERD, hyperlipidemia, history of irregular heartbeat, type 2 diabetes mellitus who is brought from her assisted living facility due to becoming confused, locking herself in her room, then being found lying down on the floor with a broken bed, the TV knocked off the TV stand and the patient stating that everything in the room that had color needed to be removed by the staff because anything with color contain the devil.  The staff from the facility suspects that the patient has not been taking her medications.  She has some trouble elaborating about what has happened over the past few days.  It seems that she has not been eating or drinking properly.  She seems to be in no distress at the moment and she is able to answer simple questions and denies having headache, chest pain, dyspnea, abdominal or joint pain at this time.    PT Comments    Patient demonstrates slightly increased endurance for ambulation in room/hallway without loss of balance, good return for completing BLE ROM/strengthening exercises with verbal cues and occasional demonstration and tolerated staying up in chair after therapy.  Patient will benefit from continued physical therapy in hospital and recommended venue below to increase strength, balance, endurance for safe ADLs and gait.    Follow Up Recommendations  Home health PT;Supervision for mobility/OOB;Supervision - Intermittent     Equipment Recommendations  None recommended by PT    Recommendations for Other Services       Precautions / Restrictions Precautions Precautions: Fall Restrictions Weight Bearing Restrictions: No    Mobility  Bed Mobility               General bed  mobility comments: Patient presents up in chair (assisted by nursing staff)  Transfers Overall transfer level: Needs assistance Equipment used: Rolling walker (2 wheeled);None Transfers: Sit to/from [employer] to Stand: Min guard Stand pivot transfers: Min guard       General transfer comment: requires less assistance for sit to stands and transfers  Ambulation/Gait Ambulation/Gait assistance: Supervision;Min guard Gait Distance (Feet): 85 Feet Assistive device: Rolling walker (2 wheeled) Gait Pattern/deviations: Decreased step length - right;Decreased step length - left;Decreased stride length Gait velocity: decreased   General Gait Details: slightly increased endurance/distance for ambulation demonstrating slow slightly labored cadence without loss of balance, limited secondary to fatigue   Stairs             Wheelchair Mobility    Modified Rankin (Stroke Patients Only)       Balance Overall balance assessment: Needs assistance Sitting-balance support: Feet supported;No upper extremity supported Sitting balance-Leahy Scale: Good Sitting balance - Comments: seated at EOB   Standing balance support: During functional activity;Bilateral upper extremity supported Standing balance-Leahy Scale: Fair Standing balance comment: using RW                            Cognition Arousal/Alertness: Awake/alert Behavior During Therapy: WFL for tasks assessed/performed Overall Cognitive Status: History of cognitive impairments - at baseline  Exercises General Exercises - Lower Extremity Long Arc Quad: Seated;AROM;Strengthening;Both;15 reps Hip Flexion/Marching: Seated;AROM;Strengthening;Both;15 reps Toe Raises: Seated;AROM;Strengthening;Both;15 reps Heel Raises: Seated;AROM;Strengthening;Both;15 reps    General Comments        Pertinent Vitals/Pain Pain Assessment: No/denies pain     Home Living                      Prior Function            PT Goals (current goals can now be found in the care plan section) Acute Rehab PT Goals Patient Stated Goal: return home with group home staff to assist PT Goal Formulation: With patient Time For Goal Achievement: 10/16/19 Potential to Achieve Goals: Good Progress towards PT goals: Progressing toward goals    Frequency    Min 3X/week      PT Plan Current plan remains appropriate    Co-evaluation              AM-PAC PT "6 Clicks" Mobility   Outcome Measure  Help needed turning from your back to your side while in a flat bed without using bedrails?: A Little Help needed moving from lying on your back to sitting on the side of a flat bed without using bedrails?: A Little Help needed moving to and from a bed to a chair (including a wheelchair)?: A Little Help needed standing up from a chair using your arms (e.g., wheelchair or bedside chair)?: A Little Help needed to walk in hospital room?: A Little Help needed climbing 3-5 steps with a railing? : A Little 6 Click Score: 18    End of Session   Activity Tolerance: Patient tolerated treatment well;Patient limited by fatigue Patient left: in chair;with call bell/phone within reach;with chair alarm set Nurse Communication: Mobility status PT Visit Diagnosis: Unsteadiness on feet (R26.81);Other abnormalities of gait and mobility (R26.89);Muscle weakness (generalized) (M62.81)     Time: 1224-8250 PT Time Calculation (min) (ACUTE ONLY): 22 min  Charges:  $Gait Training: 8-22 mins $Therapeutic Exercise: 8-22 mins                     12:22 PM, 10/10/19 Maria Pace, MPT Physical Therapist with [facility] [phone] office [phone] mobile phone

## 2019-10-10 NOTE — TOC Progression Note (Signed)
Transition of Care [facility]) - Progression Note    Patient Details  Name: Maria Pace MRN: [id number] Date of Birth: 04/21/1940  Transition of Care [facility]) CM/SW Contact  Elliot Gault, [name] Phone Number: 10/10/2019, 4:58 PM  Clinical Narrative:     Faxed requested clinical to the [facility] earlier today. Received call from [facility] in Admissions and then spoke with Manager, Velna Hatchet, regarding their need for new IVC on patient before they can accept.   Updated MD. Working on new IVC. Manager, Velna Hatchet, stated that they will hold bed for pt to transfer tomorrow.  Faxed documents to the Magistrate. Will follow up to make sure they received it.   TOC will follow.  Expected Discharge Plan: Rest Home Barriers to Discharge: Continued Medical Work up  Expected Discharge Plan and Services Expected Discharge Plan: Rest Home In-house Referral: Clinical Social Work     Living arrangements for the past 2 months: Group Home Expected Discharge Date: 10/10/19                                     Social Determinants of Health (SDOH) Interventions    Readmission Risk Interventions No flowsheet data found.

## 2019-10-10 NOTE — BH Assessment (Addendum)
Per Elliot Gault, [name] the patient has been medically cleared for discharge to gero-psychiatric treatment services.   The patient has a bed PENDING at [facility].   According to [facility] in admissions, [facility] is requesting documentation confirming the patient is medically cleared and her IVC paperwork. Mathis Fare reports once that information is received and reviewed, he will call TTS with the patient's bed acceptance information.   Once information is received from [facility] ED, CSW will fax information to [facility] at [phone].   Disposition/TTS will continue to follow.     Baldo Daub, MSW, [name] Clinical Social Worker [facility]

## 2019-10-10 NOTE — BH Assessment (Signed)
CSW contacted [facility] to determine if there were any updates regarding the patient's pending bed.   Elliot Gault, [name] faxed the patient's requested documentation to [facility] for further review.   CSW was forwarded to a voicemail and left a HIPAA compliant voicemail requesting a call back with updates.    TTS/Disposition will continue to follow.    Baldo Daub, MSW, [name] Clinical Social Worker [facility]

## 2019-10-11 DIAGNOSIS — E1121 Type 2 diabetes mellitus with diabetic nephropathy: Secondary | ICD-10-CM | POA: Diagnosis not present

## 2019-10-11 DIAGNOSIS — Z130 Encounter for screening for diseases of the blood and blood-forming organs and certain disorders involving the immune mechanism: Secondary | ICD-10-CM | POA: Diagnosis not present

## 2019-10-11 DIAGNOSIS — Z13818 Encounter for screening for other digestive system disorders: Secondary | ICD-10-CM | POA: Diagnosis not present

## 2019-10-11 DIAGNOSIS — E8881 Metabolic syndrome: Secondary | ICD-10-CM | POA: Diagnosis not present

## 2019-10-11 DIAGNOSIS — F23 Brief psychotic disorder: Secondary | ICD-10-CM | POA: Diagnosis not present

## 2019-10-11 DIAGNOSIS — R8290 Unspecified abnormal findings in urine: Secondary | ICD-10-CM | POA: Diagnosis not present

## 2019-10-11 DIAGNOSIS — E860 Dehydration: Secondary | ICD-10-CM | POA: Diagnosis not present

## 2019-10-11 DIAGNOSIS — R946 Abnormal results of thyroid function studies: Secondary | ICD-10-CM | POA: Diagnosis not present

## 2019-10-11 DIAGNOSIS — E11620 Type 2 diabetes mellitus with diabetic dermatitis: Secondary | ICD-10-CM | POA: Diagnosis not present

## 2019-10-11 DIAGNOSIS — Z5181 Encounter for therapeutic drug level monitoring: Secondary | ICD-10-CM | POA: Diagnosis not present

## 2019-10-11 DIAGNOSIS — E871 Hypo-osmolality and hyponatremia: Secondary | ICD-10-CM | POA: Diagnosis not present

## 2019-10-11 DIAGNOSIS — R2689 Other abnormalities of gait and mobility: Secondary | ICD-10-CM | POA: Diagnosis not present

## 2019-10-11 DIAGNOSIS — I959 Hypotension, unspecified: Secondary | ICD-10-CM | POA: Diagnosis not present

## 2019-10-11 DIAGNOSIS — F0390 Unspecified dementia without behavioral disturbance: Secondary | ICD-10-CM | POA: Diagnosis present

## 2019-10-11 DIAGNOSIS — A539 Syphilis, unspecified: Secondary | ICD-10-CM | POA: Diagnosis not present

## 2019-10-11 DIAGNOSIS — M791 Myalgia, unspecified site: Secondary | ICD-10-CM | POA: Diagnosis not present

## 2019-10-11 DIAGNOSIS — G4709 Other insomnia: Secondary | ICD-10-CM | POA: Diagnosis not present

## 2019-10-11 DIAGNOSIS — F0391 Unspecified dementia with behavioral disturbance: Secondary | ICD-10-CM | POA: Diagnosis not present

## 2019-10-11 DIAGNOSIS — R21 Rash and other nonspecific skin eruption: Secondary | ICD-10-CM | POA: Diagnosis not present

## 2019-10-11 DIAGNOSIS — F29 Unspecified psychosis not due to a substance or known physiological condition: Secondary | ICD-10-CM | POA: Diagnosis present

## 2019-10-11 DIAGNOSIS — Z79899 Other long term (current) drug therapy: Secondary | ICD-10-CM | POA: Diagnosis not present

## 2019-10-11 DIAGNOSIS — R5383 Other fatigue: Secondary | ICD-10-CM | POA: Diagnosis not present

## 2019-10-11 DIAGNOSIS — I10 Essential (primary) hypertension: Secondary | ICD-10-CM | POA: Diagnosis not present

## 2019-10-11 DIAGNOSIS — F209 Schizophrenia, unspecified: Secondary | ICD-10-CM | POA: Diagnosis present

## 2019-10-11 DIAGNOSIS — I471 Supraventricular tachycardia: Secondary | ICD-10-CM | POA: Diagnosis not present

## 2019-10-11 DIAGNOSIS — R238 Other skin changes: Secondary | ICD-10-CM | POA: Diagnosis not present

## 2019-10-11 DIAGNOSIS — E785 Hyperlipidemia, unspecified: Secondary | ICD-10-CM | POA: Diagnosis not present

## 2019-10-11 DIAGNOSIS — Z202 Contact with and (suspected) exposure to infections with a predominantly sexual mode of transmission: Secondary | ICD-10-CM | POA: Diagnosis not present

## 2019-10-11 DIAGNOSIS — R7309 Other abnormal glucose: Secondary | ICD-10-CM | POA: Diagnosis not present

## 2019-10-11 DIAGNOSIS — N179 Acute kidney failure, unspecified: Secondary | ICD-10-CM | POA: Diagnosis not present

## 2019-10-11 DIAGNOSIS — D510 Vitamin B12 deficiency anemia due to intrinsic factor deficiency: Secondary | ICD-10-CM | POA: Diagnosis not present

## 2019-10-11 DIAGNOSIS — Z13228 Encounter for screening for other metabolic disorders: Secondary | ICD-10-CM | POA: Diagnosis not present

## 2019-10-11 DIAGNOSIS — Z113 Encounter for screening for infections with a predominantly sexual mode of transmission: Secondary | ICD-10-CM | POA: Diagnosis not present

## 2019-10-11 DIAGNOSIS — Z712 Person consulting for explanation of examination or test findings: Secondary | ICD-10-CM | POA: Diagnosis not present

## 2019-10-11 DIAGNOSIS — E669 Obesity, unspecified: Secondary | ICD-10-CM | POA: Diagnosis present

## 2019-10-11 DIAGNOSIS — E559 Vitamin D deficiency, unspecified: Secondary | ICD-10-CM | POA: Diagnosis not present

## 2019-10-11 DIAGNOSIS — J9691 Respiratory failure, unspecified with hypoxia: Secondary | ICD-10-CM | POA: Diagnosis not present

## 2019-10-11 DIAGNOSIS — E039 Hypothyroidism, unspecified: Secondary | ICD-10-CM | POA: Diagnosis not present

## 2019-10-11 DIAGNOSIS — Z131 Encounter for screening for diabetes mellitus: Secondary | ICD-10-CM | POA: Diagnosis not present

## 2019-10-11 DIAGNOSIS — Z114 Encounter for screening for human immunodeficiency virus [HIV]: Secondary | ICD-10-CM | POA: Diagnosis not present

## 2019-10-11 DIAGNOSIS — Z1159 Encounter for screening for other viral diseases: Secondary | ICD-10-CM | POA: Diagnosis not present

## 2019-10-11 DIAGNOSIS — K5909 Other constipation: Secondary | ICD-10-CM | POA: Diagnosis not present

## 2019-10-11 DIAGNOSIS — Z13220 Encounter for screening for lipoid disorders: Secondary | ICD-10-CM | POA: Diagnosis not present

## 2019-10-11 DIAGNOSIS — N390 Urinary tract infection, site not specified: Secondary | ICD-10-CM | POA: Diagnosis not present

## 2019-10-11 DIAGNOSIS — Z20822 Contact with and (suspected) exposure to covid-19: Secondary | ICD-10-CM | POA: Diagnosis not present

## 2019-10-11 DIAGNOSIS — E119 Type 2 diabetes mellitus without complications: Secondary | ICD-10-CM | POA: Diagnosis not present

## 2019-10-11 DIAGNOSIS — I5042 Chronic combined systolic (congestive) and diastolic (congestive) heart failure: Secondary | ICD-10-CM | POA: Diagnosis not present

## 2019-10-11 LAB — URINE CULTURE: Culture: NO GROWTH

## 2019-10-11 LAB — GLUCOSE, CAPILLARY: Glucose-Capillary: 138 mg/dL — ABNORMAL HIGH (ref 70–99)

## 2019-10-11 NOTE — TOC Transition Note (Signed)
Transition of Care [facility]) - CM/SW Discharge Note   Patient Details  Name: Maria Pace MRN: [id number] Date of Birth: 08-17-1940  Transition of Care [facility]) CM/SW Contact:  Elliot Gault, [name] Phone Number: 10/11/2019, 9:47 AM   Clinical Narrative:     New IVC completed last night. Faxed paperwork to [facility] this AM. Pt is accepted and can transfer today. Accepting MD is Dr. [name]. Pt going to [place] [facility]. Number for report is [phone].  Updated RN who will call report. Updated pt's sister. Updated pt and provided her with her copy of the IVC order.  Will call [facility] for transport.  There are no other TOC needs for dc.   Final next level of care: Psychiatric Hospital Barriers to Discharge: Barriers Resolved   Patient Goals and CMS Choice Patient states their goals for this hospitalization and ongoing recovery are:: return to [facility]      Discharge Placement                       Discharge Plan and Services In-house Referral: Clinical Social Work                                   Social Determinants of Health (SDOH) Interventions     Readmission Risk Interventions No flowsheet data found.

## 2019-10-11 NOTE — Discharge Instructions (Signed)

## 2019-10-11 NOTE — Progress Notes (Signed)
PROGRESS NOTE  Maria Pace PJK:[id number] DOB: 1940/09/07 DOA: 10/03/2019 PCP: Avon Gully, MD  Brief History:  79 y.o.femalewith medical history significant ofbipolar 1 disorder, schizophrenia, essential hypertension, GERD, hyperlipidemia, history of irregular heartbeat, type 2 diabetes mellitus who is brought from her assisted living facility due to becoming confused, locking herself in her room, then being found lying down on the floor with a broken bed, the TV knocked off the TV stand and the patient stating that everything in the roomthat hadcolor needed to be removed by the staffbecause anything with colorcontain the devil. The staff from the facility suspects that the patient has not been taking her medications. She has some trouble elaborating about what has happened over the past few days. It seems that she has not been eating or drinking properly. She seems to be in no distress at the moment and she is able to answer simple questions and denies having headache, chest pain, dyspnea, abdominal or joint pain at this time.  As her metabolic derangements were addressed (AKI, low Na stroke), her mental status overall improved.  On the day of d/c, her caretaker from her group home stated that the patient's mental status had improved and was near baseline.  Assessment/Plan: Acute metabolic encephalopathy -Multifactorial including hyponatremia, acute kidney injury, medication inducedand low B12 -supplement B12 - patient "plays possum" intermittently -pt refuses to speak and interact, but has been seen to move all 4 extremities antigravity and wakes up when staff leaves the room -later in day 10/09/19 patient seen walking halls with PT without difficulty and interactive and A&O x3 -MR brain--punctate acute/early subacute infarct R-frontal lobe/precentral gyrus -10/10/19--A&O x 3 and following commands -10/11/19-care taker at bedside--mental status improved  Acute ischemic  stroke -PT/OT evaluation-->HHPT -Speech therapy eval -CT brain--neg acute -MRI brain---punctate acute/early subacute infarct R-frontal lobe/precentral gyrus -MRA brain--no LVO -Carotid Duplex--no hemodynamically significant stenosis -Echo--EF 30-35%, G2DD, mild TR, mild to mod MR -LDL--120 -HbA1C--6.3 -Antiplatelet--none-->using apixaban -discussed with neurology, Dr. Leone Haven need to transfer to [facility].  Continue apixaban.  Adding ASA at this time will increase risk of bleed unless pt previously on it for CAD.  Acute kidney injury -Baseline creatinine 1.0-1.3 -Serum creatinine peaked 2.30 -Secondary to volume depletion -Initially placed on IV fluids -improved with IVF -serum creatinine 1.24 on day of d/c -Renal ultrasound--neg for hydronephrosis  Paroxysmal atrial fibrillation -Discontinue diltiazem in the setting of suppressed EF -10/06/2019 echo EF 30-35%, grade 2 DD, mild to moderate MR, mild TR -Restart metoprololat lower dose due to soft BPs -continue apixaban -rate controlled -TSH 1.481  Hyponatremia -Review of medical records shows that the patient has a history of chronic hyponatremia with sodium ranging 128-131 -secondary to volume depletion, HCTZ and poor solute intake -Restart IV fluids-->improved -Na 134 on day of d/c  Essential HTN -D/c losartan, hydralazine, diltiazem, spironolactone -decrease dose of metoprolol  Chronic combined systolic and diastolic CHF -10/06/19 Echo EF 80-99%, G2DD, mild TR, Mild to mod MR -continue metoprolol -appears euvolemic -start low dose lisinopril after d/c -repeat BMP in one week  DM2 -10/03/19 A1C--7.8 -continue novolog sliding scale -restart oral agents at time of d/c  Hypomagnesemia -repleted  Schizophrenia -continue zyprexa, risperdal, trazodone  Vitamin D deficiency -continue drisdol  Mixed hyperlipidemia -increased lipitor to 40 mg daily    Status is: Inpatient  Remains inpatient  appropriate because:Altered mental status   Dispo: The patient is from: ALF  Anticipated d/c is to: Behavioral [facility]              Anticipated d/c date is: 10/11/19              Patient currently is medically stable to d/c.        Family Communication:   Care taker updated at bedside  Consultants:  psychiatry  Code Status:  FULL  DVT Prophylaxis:  apixaban   Procedures: As Listed in Progress Note Above  Antibiotics: None       Subjective:  Patient denies fevers, chills, headache, chest pain, dyspnea, nausea, vomiting, diarrhea, abdominal pain, dysuria, hematuria, hematochezia, and melena.  Objective: Vitals:   10/10/19 1439 10/10/19 2043 10/11/19 0436 10/11/19 1047  BP: 105/69 135/80 119/63 (!) 146/81  Pulse: 97 (!) 102 79 87  Resp: 20 16 16 16   Temp: 98.3 F (36.8 C) 97.9 F (36.6 C) 98.1 F (36.7 C)   TempSrc:  Oral Oral   SpO2: 98% 99% 100% 95%  Weight:      Height:        Intake/Output Summary (Last 24 hours) at 10/11/2019 1103 Last data filed at 10/11/2019 10/13/2019 Gross per 24 hour  Intake --  Output 100 ml  Net -100 ml   Weight change:  Exam:   General:  Pt is alert, follows commands appropriately, not in acute distress  HEENT: No icterus, No thrush, No neck mass, [place]/AT  Cardiovascular: RRR, S1/S2, no rubs, no gallops  Respiratory:bibasilar crackles. No wheeze  Abdomen: Soft/+BS, non tender, non distended, no guarding  Extremities: Non pitting edema, No lymphangitis, No petechiae, No rashes, no synovitis   Data Reviewed: I have personally reviewed following labs and imaging studies Basic Metabolic Panel: Recent Labs  Lab 10/06/19 0534 10/07/19 0423 10/08/19 0543 10/09/19 0544 10/10/19 0642  NA 133* 134* 134* 132* 134*  K 3.9 4.0 4.0 4.4 4.2  CL 101 101 100 98 103  CO2 24 24 24 24 24   GLUCOSE 126* 138* 211* 149* 124*  BUN 9 9 14 23 22   CREATININE 0.92 0.91 0.93 1.74* 1.24*  CALCIUM 8.7* 9.3 9.4 9.1 8.8*  MG  1.3* 1.8 1.5* 2.1 1.8   Liver Function Tests: No results for input(s): AST, ALT, ALKPHOS, BILITOT, PROT, ALBUMIN in the last 168 hours. No results for input(s): LIPASE, AMYLASE in the last 168 hours. Recent Labs  Lab 10/09/19 1053  AMMONIA <9*   Coagulation Profile: No results for input(s): INR, PROTIME in the last 168 hours. CBC: Recent Labs  Lab 10/06/19 0534 10/07/19 0423 10/08/19 0543 10/09/19 0544 10/10/19 0642  WBC 8.9 10.8* 11.9* 14.0* 10.3  HGB 9.4* 9.8* 10.4* 9.8* 9.0*  HCT 29.8* 30.3* 33.7* 31.5* 28.8*  MCV 88.4 86.6 88.2 88.0 88.1  PLT 254 274 285 285 266   Cardiac Enzymes: Recent Labs  Lab 10/05/19 0430 10/09/19 1053  CKTOTAL 286* 288*   BNP: Invalid input(s): POCBNP CBG: Recent Labs  Lab 10/10/19 0753 10/10/19 1128 10/10/19 1642 10/10/19 2122 10/11/19 0811  GLUCAP 125* 252* 108* 147* 138*   HbA1C: No results for input(s): HGBA1C in the last 72 hours. Urine analysis:    Component Value Date/Time   COLORURINE YELLOW 10/09/2019 1832   APPEARANCEUR CLEAR 10/09/2019 1832   APPEARANCEUR Clear 10/28/2013 0457   LABSPEC 1.009 10/09/2019 1832   LABSPEC 1.005 10/28/2013 0457   PHURINE 6.0 10/09/2019 1832   GLUCOSEU NEGATIVE 10/09/2019 1832   GLUCOSEU 50 mg/dL 12/28/2013 10/11/2019   HGBUR NEGATIVE 10/09/2019 1832  BILIRUBINUR NEGATIVE 10/09/2019 1832   BILIRUBINUR Negative 10/28/2013 0457   KETONESUR NEGATIVE 10/09/2019 1832   PROTEINUR NEGATIVE 10/09/2019 1832   NITRITE NEGATIVE 10/09/2019 1832   LEUKOCYTESUR NEGATIVE 10/09/2019 1832   LEUKOCYTESUR 3+ 10/28/2013 0457   Sepsis Labs: @LABRCNTIP (procalcitonin:4,lacticidven:4) ) Recent Results (from the past 240 hour(s))  SARS Coronavirus 2 by RT PCR (hospital order, performed in [facility] lab) Nasopharyngeal Nasopharyngeal Swab     Status: None   Collection Time: 10/03/19  8:36 PM   Specimen: Nasopharyngeal Swab  Result Value Ref Range Status   SARS Coronavirus 2 NEGATIVE NEGATIVE  Final    Comment: (NOTE) SARS-CoV-2 target nucleic acids are NOT DETECTED.  The SARS-CoV-2 RNA is generally detectable in upper and lower respiratory specimens during the acute phase of infection. The lowest concentration of SARS-CoV-2 viral copies this assay can detect is 250 copies / mL. A negative result does not preclude SARS-CoV-2 infection and should not be used as the sole basis for treatment or other patient management decisions.  A negative result may occur with improper specimen collection / handling, submission of specimen other than nasopharyngeal swab, presence of viral mutation(s) within the areas targeted by this assay, and inadequate number of viral copies (<250 copies / mL). A negative result must be combined with clinical observations, patient history, and epidemiological information.  Fact Sheet for Patients:   BoilerBrush.com.cyhttps://www.fda.gov/media/136312/download  Fact Sheet for Healthcare Providers: https://pope.com/https://www.fda.gov/media/136313/download  This test is not yet approved or  cleared by the [place] FDA and has been authorized for detection and/or diagnosis of SARS-CoV-2 by FDA under an Emergency Use Authorization (EUA).  This EUA will remain in effect (meaning this test can be used) for the duration of the COVID-19 declaration under Section 564(b)(1) of the Act, 21 U.S.C. section 360bbb-3(b)(1), unless the authorization is terminated or revoked sooner.  Performed at [facility], [address] [place], [place] [address]   MRSA PCR Screening     Status: None   Collection Time: 10/03/19 11:07 PM   Specimen: Nasal Mucosa; Nasopharyngeal  Result Value Ref Range Status   MRSA by PCR NEGATIVE NEGATIVE Final    Comment:        The GeneXpert MRSA Assay (FDA approved for NASAL specimens only), is one component of a comprehensive MRSA colonization surveillance program. It is not intended to diagnose MRSA infection nor to guide or monitor treatment for MRSA  infections. Performed at [facility], [address] [place], [place] [address]   Culture, Urine     Status: None   Collection Time: 10/06/19  4:00 PM   Specimen: Urine, Clean Catch  Result Value Ref Range Status   Specimen Description   Final    URINE, CLEAN CATCH Performed at [facility], [address] [place], [place] [address]    Special Requests   Final    NONE Performed at [facility], [address] [place], [place] [address]    Culture   Final    NO GROWTH Performed at [facility], [address] [place], [place] [address]    Report Status 10/08/2019 FINAL  Final  Culture, Urine     Status: None   Collection Time: 10/09/19  6:32 PM   Specimen: Urine, Random  Result Value Ref Range Status   Specimen Description   Final    URINE, RANDOM Performed at [facility], [address] [place], [place] [address]    Special Requests   Final    NONE  Performed at [facility], [address], [place], [place] [address]    Culture   Final    NO GROWTH Performed at [facility], [address], [place], [place] [address]    Report Status 10/11/2019 FINAL  Final     Scheduled Meds: . apixaban  5 mg Oral BID  . atorvastatin  20 mg Oral Daily  . Chlorhexidine Gluconate Cloth  6 each Topical Daily  . docusate sodium  200 mg Oral Daily  . insulin aspart  0-15 Units Subcutaneous TID WC  . latanoprost  1 drop Both Eyes QHS  . metoprolol tartrate  25 mg Oral BID  . OLANZapine  10 mg Oral QHS  . pantoprazole  40 mg Oral Daily  . risperiDONE  1 mg Oral QHS  . senna-docusate  2 tablet Oral QHS  . traZODone  50 mg Oral QHS  . vitamin B-12  500 mcg Oral Daily  . Vitamin D (Ergocalciferol)  50,000 Units Oral Q Mon   Continuous Infusions:  Procedures/Studies: DG Chest 1 View  Result Date: 10/04/2019 CLINICAL DATA:  Leukocytosis. EXAM: CHEST  1 VIEW COMPARISON:  Chest x-ray 06/13/2018. FINDINGS: Mediastinum hilar structures normal. Low lung volumes. Mild right  base subsegmental atelectasis. No pleural effusion or pneumothorax. Degenerative changes scoliosis thoracic spine. IMPRESSION: Low lung volumes with mild right base subsegmental atelectasis. Electronically Signed   By: Maisie Fus  Register   On: 10/04/2019 10:27   CT HEAD WO CONTRAST  Result Date: 10/04/2019 CLINICAL DATA:  Encephalopathy. EXAM: CT HEAD WITHOUT CONTRAST TECHNIQUE: Contiguous axial images were obtained from the base of the skull through the vertex without intravenous contrast. COMPARISON:  Head CT 10/28/2013, report from head CT 04/02/2018 (images unavailable). FINDINGS: Brain: Mild generalized parenchymal atrophy. No hyperdense vessel. No demarcated cortical infarct. No extra-axial fluid collection. No evidence of intracranial mass. No midline shift. Vascular: No hyperdense vessel. Skull: Normal. Negative for fracture or focal lesion. Sinuses/Orbits: Visualized orbits show no acute finding. Small right maxillary sinus mucous retention cyst. Mild ethmoid sinus mucosal thickening. No significant mastoid effusion. IMPRESSION: No CT evidence of acute intracranial abnormality. Mild generalized parenchymal atrophy, stable. Mild ethmoid sinus mucosal thickening. Small right maxillary sinus mucous retention cyst. Electronically Signed   By: Jackey Loge DO   On: 10/04/2019 11:32   MR ANGIO HEAD WO CONTRAST  Result Date: 10/10/2019 CLINICAL DATA:  Stroke on MRI EXAM: MRA HEAD WITHOUT CONTRAST TECHNIQUE: Angiographic images of the Circle of Willis were obtained using MRA technique without intravenous contrast. COMPARISON:  None. FINDINGS: Intracranial internal carotid arteries are patent. Middle and anterior cerebral arteries are patent. Intracranial vertebral arteries, basilar artery, posterior cerebral arteries are patent. There is no significant stenosis or aneurysm. IMPRESSION: No proximal intracranial vessel occlusion or significant stenosis. Electronically Signed   By: Guadlupe Spanish M.D.   On:  10/10/2019 09:50   MR BRAIN WO CONTRAST  Result Date: 10/09/2019 CLINICAL DATA:  Encephalopathy. Additional history provided: Confusion. EXAM: MRI HEAD WITHOUT CONTRAST TECHNIQUE: Multiplanar, multiecho pulse sequences of the brain and surrounding structures were obtained without intravenous contrast. COMPARISON:  Head CT 10/04/2019, head CT 10/28/2013 FINDINGS: Brain: The patient was unable to tolerate the full examination. As a result, the routine axial T1 weighted sequence and coronal T2 weighted sequence could not be obtained. There is motion degradation of the acquired sequences. Most notably, there is mild-to-moderate motion degradation of the axial T2/FLAIR sequence. This limits evaluation. Mild generalized parenchymal atrophy. There is a punctate focus  of restricted diffusion within the right frontal lobe precentral gyrus (series 4, image 96) (series 5, image 41). Minimal ill-defined and scattered T2/FLAIR hyperintensity within the cerebral white matter is nonspecific, but likely reflects minimal changes of chronic small vessel ischemia. No evidence of intracranial mass. No chronic intracranial blood products. No extra-axial fluid collection. No midline shift. Vascular: Expected proximal arterial flow voids. Skull and upper cervical spine: No focal marrow lesion. Sinuses/Orbits: Visualized orbits show no acute finding. Mild ethmoid and right maxillary sinus mucosal thickening. No significant mastoid effusion. IMPRESSION: Prematurely terminated and motion degraded examination as described. The diffusion-weighted imaging is of good quality however. Punctate acute/early subacute infarct within the right frontal lobe precentral gyrus. Mild generalized parenchymal atrophy and chronic small vessel ischemic disease. Mild ethmoid and right maxillary sinus mucosal thickening. Electronically Signed   By: Jackey Loge DO   On: 10/09/2019 14:42   US RENAL  Result Date: 10/09/2019 CLINICAL DATA:  Acute renal  insufficiency EXAM: RENAL / URINARY TRACT ULTRASOUND COMPLETE COMPARISON:  02/22/2017 FINDINGS: Right Kidney: Renal measurements: 10.8 x 5.2 x 5.0 cm = volume: 148 mL. There is a 1.9 x 1.9 by 1.4 cm complex cyst lower pole right kidney, incompletely characterized by this study. Dedicated renal MRI recommended on a nonemergent basis. Right renal cortical echotexture is unremarkable. Left Kidney: Renal measurements: 10.9 x 4.9 x 4.9 cm = volume: 136 mL. Echogenicity within normal limits. No mass or hydronephrosis visualized. Bladder: Appears normal for degree of bladder distention. Other: None. IMPRESSION: 1. 1.9 cm complex right renal cyst. Dedicated renal MRI recommended for complete characterization. 2. Otherwise unremarkable appearance of the kidneys. Electronically Signed   By: Sharlet Salina M.D.   On: 10/09/2019 16:03   US Carotid Bilateral  Result Date: 10/10/2019 CLINICAL DATA:  Stroke. History of hypertension, hyperlipidemia and diabetes. EXAM: BILATERAL CAROTID DUPLEX ULTRASOUND TECHNIQUE: Wallace Cullens scale imaging, color Doppler and duplex ultrasound were performed of bilateral carotid and vertebral arteries in the neck. COMPARISON:  None. FINDINGS: Criteria: Quantification of carotid stenosis is based on velocity parameters that correlate the residual internal carotid diameter with NASCET-based stenosis levels, using the diameter of the distal internal carotid lumen as the denominator for stenosis measurement. The following velocity measurements were obtained: RIGHT ICA: 90/13 cm/sec CCA: 104/21 cm/sec SYSTOLIC ICA/CCA RATIO:  0.9 ECA: 112 cm/sec LEFT ICA: 98/25 cm/sec CCA: 88/20 cm/sec SYSTOLIC ICA/CCA RATIO:  0.1 ECA: 73 cm/sec RIGHT CAROTID ARTERY: There is a minimal amount of eccentric echogenic plaque within the right carotid bulb (image 16), extending to involve the origin and proximal aspects of the right internal carotid artery (image 24), not resulting in elevated peak systolic velocities within the  interrogated course of the right internal carotid artery to suggest a hemodynamically significant stenosis. Borderline elevated peak systolic velocity within the mid aspect of the right internal carotid artery is felt to be factitiously elevated due to sampling at a location of turbulent flow. RIGHT VERTEBRAL ARTERY:  Antegrade Flow LEFT CAROTID ARTERY: There is a minimal amount of eccentric echogenic plaque within left carotid bulb (image 50), not resulting in elevated peak systolic velocities within the interrogated course of the left internal carotid artery to suggest a hemodynamically significant stenosis. LEFT VERTEBRAL ARTERY:  Antegrade flow IMPRESSION: Minimal amount of bilateral atherosclerotic plaque, right subjectively greater than left, not resulting in a hemodynamically significant stenosis within either internal carotid artery. Electronically Signed   By: Simonne Come M.D.   On: 10/10/2019 09:20   ECHOCARDIOGRAM COMPLETE  Result Date: 10/06/2019    ECHOCARDIOGRAM REPORT   Patient Name:   [name] Date of Exam: 10/06/2019 Medical Rec #:  [id number]      Height:       60.0 in Accession #:    [id number]     Weight:       200.6 lb Date of Birth:  Jun 14, 1940       BSA:          1.869 m Patient Age:    78 years       BP:           110/88 mmHg Patient Gender: F              HR:           70 bpm. [facility] Procedure: 2D Echo, Cardiac Doppler and Color Doppler                            MODIFIED REPORT: This report was modified by Lennie Odor MD on 10/06/2019 due to rvsp.  Indications:     Atrial Fibrillation 427.31 / I48.91  History:         Patient has no prior history of Echocardiogram examinations.                  Risk Factors:Hypertension, Diabetes and Dyslipidemia.                  Schizophrenia,Irregular heart beat (From Hx),GERD.  Sonographer:     Celesta Gentile RCS Referring Phys:  [id number] Cleora Fleet Diagnosing Phys: Lennie Odor MD IMPRESSIONS  1. Left ventricular  ejection fraction, by estimation, is 30 to 35%. The left ventricle has moderately decreased function. The left ventricle demonstrates global hypokinesis. The left ventricular internal cavity size was mildly dilated. Left ventricular diastolic parameters are consistent with Grade II diastolic dysfunction (pseudonormalization). Elevated left atrial pressure.  2. Right ventricular systolic function is normal. The right ventricular size is normal. There is normal pulmonary artery systolic pressure. The estimated right ventricular systolic pressure is 24.0 mmHg.  3. Left atrial size was mildly dilated.  4. The mitral valve is grossly normal. Mild to moderate mitral valve regurgitation. No evidence of mitral stenosis.  5. The aortic valve is tricuspid. Aortic valve regurgitation is trivial. No aortic stenosis is present.  6. The inferior vena cava is normal in size with greater than 50% respiratory variability, suggesting right atrial pressure of 3 mmHg. FINDINGS  Left Ventricle: Left ventricular ejection fraction, by estimation, is 30 to 35%. The left ventricle has moderately decreased function. The left ventricle demonstrates global hypokinesis. The left ventricular internal cavity size was mildly dilated. There is no left ventricular hypertrophy. Left ventricular diastolic parameters are consistent with Grade II diastolic dysfunction (pseudonormalization). Elevated left atrial pressure. Right Ventricle: The right ventricular size is normal. No increase in right ventricular wall thickness. Right ventricular systolic function is normal. There is normal pulmonary artery systolic pressure. The tricuspid regurgitant velocity is 2.29 m/s, and  with an assumed right atrial pressure of 3 mmHg, the estimated right ventricular systolic pressure is 24.0 mmHg. Left Atrium: Left atrial size was mildly dilated. Right Atrium: Right atrial size was normal in size. Pericardium: There is no evidence of pericardial effusion. Mitral  Valve: The mitral valve is grossly normal. Mild to moderate mitral valve regurgitation. No evidence of mitral valve stenosis. Tricuspid Valve: The tricuspid valve is  grossly normal. Tricuspid valve regurgitation is mild . No evidence of tricuspid stenosis. Aortic Valve: The aortic valve is tricuspid. Aortic valve regurgitation is trivial. Aortic regurgitation PHT measures 671 msec. No aortic stenosis is present. Pulmonic Valve: The pulmonic valve was grossly normal. Pulmonic valve regurgitation is trivial. No evidence of pulmonic stenosis. Aorta: The aortic root is normal in size and structure. Venous: The inferior vena cava is normal in size with greater than 50% respiratory variability, suggesting right atrial pressure of 3 mmHg. IAS/Shunts: The atrial septum is grossly normal.  LEFT VENTRICLE PLAX 2D LVIDd:         5.66 cm     Diastology LVIDs:         4.61 cm     LV e' lateral:   8.38 cm/s LV PW:         1.07 cm     LV E/e' lateral: 10.1 LV IVS:        1.10 cm     LV e' medial:    4.68 cm/s LVOT diam:     2.00 cm     LV E/e' medial:  18.0 LV SV:         52 LV SV Index:   28 LVOT Area:     3.14 cm  LV Volumes (MOD) LV vol d, MOD A2C: 91.2 ml LV vol d, MOD A4C: 99.1 ml LV vol s, MOD A2C: 49.9 ml LV vol s, MOD A4C: 62.7 ml LV SV MOD A2C:     41.3 ml LV SV MOD A4C:     99.1 ml LV SV MOD BP:      37.9 ml RIGHT VENTRICLE RV S prime:     9.79 cm/s TAPSE (M-mode): 2.0 cm LEFT ATRIUM             Index       RIGHT ATRIUM           Index LA diam:        4.15 cm 2.22 cm/m  RA Area:     11.30 cm LA Vol (A2C):   65.1 ml 34.83 ml/m RA Volume:   25.10 ml  13.43 ml/m LA Vol (A4C):   65.5 ml 35.04 ml/m LA Biplane Vol: 65.3 ml 34.94 ml/m  AORTIC VALVE LVOT Vmax:   99.40 cm/s LVOT Vmean:  64.000 cm/s LVOT VTI:    0.166 m AI PHT:      671 msec  AORTA Ao Root diam: 3.00 cm MITRAL VALVE                 TRICUSPID VALVE MV Area (PHT): 3.54 cm      TR Peak grad:   21.0 mmHg MV Decel Time: 214 msec      TR Vmax:        229.00 cm/s  MR Peak grad:    91.4 mmHg MR Mean grad:    41.0 mmHg   SHUNTS MR Vmax:         478.00 cm/s Systemic VTI:  0.17 m MR Vmean:        287.0 cm/s  Systemic Diam: 2.00 cm MR PISA:         1.01 cm MR PISA Eff ROA: 7 mm MR PISA Radius:  0.40 cm MV E velocity: 84.30 cm/s MV A velocity: 76.40 cm/s MV E/A ratio:  1.10 Lennie Odor MD Electronically signed by Lennie Odor MD Signature Date/Time: 10/06/2019/4:54:39 PM    Final (Updated)     Catarina Hartshorn,  DO  [facility] Hospitalists  If 7PM-7AM, please contact night-coverage [email or web address] Password [email or web address] 10/11/2019, 11:03 AM   LOS: 7 days

## 2019-10-30 NOTE — Progress Notes (Deleted)
BH MD/PA/NP OP Progress Note  10/30/2019 4:29 PM Maria Pace  MRN:  [id number]  Chief Complaint:  HPI:  - Patient went to ED for "becoming confused, locking herself in her room, then being found lying down on the floor with a broken bed, the TV knocked off the TV stand and the patient stating that everything in the roomthat hadcolor needed to be removed by the staffbecause anything with colorcontain the devil. " She was found to have hyponatremia, AKI.   Olanzapine reduced  Visit Diagnosis: No diagnosis found.  Past Psychiatric History: Please see initial evaluation for full details. I have reviewed the history. No updates at this time.     Past Medical History:  Past Medical History:  Diagnosis Date  . Bipolar 1 disorder (HCC)   . Essential hypertension   . GERD (gastroesophageal reflux disease)   . Hyperlipidemia   . Irregular heart beat   . Schizophrenia (HCC)   . Type 2 diabetes mellitus (HCC)     Past Surgical History:  Procedure Laterality Date  . YAG LASER APPLICATION Left 01/06/2015   Procedure: YAG LASER APPLICATION;  Surgeon: Jethro Bolus, MD;  Location: [facility];  Service: Ophthalmology;  Laterality: Left;    Family Psychiatric History: Please see initial evaluation for full details. I have reviewed the history. No updates at this time.     Family History:  Family History  Problem Relation Age of Onset  . Hypertension Father   . Diabetes type II Father     Social History:  Social History   Socioeconomic History  . Marital status: Single    Spouse name: Not on file  . Number of children: Not on file  . Years of education: Not on file  . Highest education level: Not on file  Occupational History  . Not on file  Tobacco Use  . Smoking status: Never Smoker  . Smokeless tobacco: Never Used  Vaping Use  . Vaping Use: Never used  Substance and Sexual Activity  . Alcohol use: No  . Drug use: No  . Sexual activity: Never  Other Topics Concern  .  Not on file  Social History Narrative  . Not on file   Social Determinants of Health   Financial Resource Strain:   . Difficulty of Paying Living Expenses:   Food Insecurity:   . Worried About Programme researcher, broadcasting/film/video in the Last Year:   . Barista in the Last Year:   Transportation Needs:   . Freight forwarder (Medical):   Marland Kitchen Lack of Transportation (Non-Medical):   Physical Activity:   . Days of Exercise per Week:   . Minutes of Exercise per Session:   Stress:   . Feeling of Stress :   Social Connections:   . Frequency of Communication with Friends and Family:   . Frequency of Social Gatherings with Friends and Family:   . Attends Religious Services:   . Active Member of Clubs or Organizations:   . Attends Banker Meetings:   Marland Kitchen Marital Status:     Allergies:  Allergies  Allergen Reactions  . Haloperidol Lactate Other (See Comments)    Couldn't urinate anymore    Metabolic Disorder Labs: Lab Results  Component Value Date   HGBA1C 6.3 (H) 10/03/2019   MPG 134.11 10/03/2019   No results found for: PROLACTIN Lab Results  Component Value Date   CHOL 168 10/10/2019   TRIG 54 10/10/2019   HDL 37 (  L) 10/10/2019   CHOLHDL 4.5 10/10/2019   VLDL 11 10/10/2019   LDLCALC 120 (H) 10/10/2019   LDLCALC 99 10/06/2019   Lab Results  Component Value Date   TSH 1.481 10/06/2019   TSH 2.634 05/18/2017    Therapeutic Level Labs: No results found for: LITHIUM Lab Results  Component Value Date   VALPROATE 64 06/13/2018   VALPROATE 86.5 02/15/2018   No components found for:  CBMZ  Current Medications: Current Outpatient Medications  Medication Sig Dispense Refill  . acetaminophen (TYLENOL) 500 MG tablet Take 500 mg by mouth every 8 (eight) hours as needed for mild pain or moderate pain.    Marland Kitchen apixaban (ELIQUIS) 5 MG TABS tablet Take 1 tablet (5 mg total) by mouth 2 (two) times daily. 60 tablet   . atorvastatin (LIPITOR) 40 MG tablet Take 1 tablet (40  mg total) by mouth daily.    . Camphor-Eucalyptus-Menthol (VICKS VAPORUB) 4.7-1.2-2.6 % OINT Apply 1 application topically daily. Apply once to toenails daily    . docusate sodium (COLACE) 100 MG capsule Take 200 mg by mouth daily.    Marland Kitchen latanoprost (XALATAN) 0.005 % ophthalmic solution Place 1 drop into both eyes at bedtime.    Marland Kitchen linagliptin (TRADJENTA) 5 MG TABS tablet Take 5 mg by mouth daily.    Marland Kitchen lisinopril (ZESTRIL) 2.5 MG tablet Take 1 tablet (2.5 mg total) by mouth daily.    Marland Kitchen loratadine (CLARITIN) 10 MG tablet Take 10 mg by mouth daily.    . metFORMIN (GLUCOPHAGE) 500 MG tablet Take 500 mg by mouth 2 (two) times daily with a meal.    . metoprolol tartrate (LOPRESSOR) 25 MG tablet Take 1 tablet (25 mg total) by mouth 2 (two) times daily.    Marland Kitchen OLANZapine (ZYPREXA) 10 MG tablet Take 1 tablet (10 mg total) by mouth at bedtime. 90 tablet 0  . pantoprazole (PROTONIX) 40 MG tablet Take 40 mg by mouth daily.    . polyethylene glycol powder (GLYCOLAX/MIRALAX) powder Take 17 g by mouth daily as needed for mild constipation or moderate constipation.    . risperiDONE (RISPERDAL) 1 MG tablet Take 1 mg by mouth at bedtime.    . senna-docusate (SENOKOT-S) 8.6-50 MG tablet Take 2 tablets by mouth at bedtime. 60 tablet 0  . traZODone (DESYREL) 50 MG tablet Take 1 tablet (50 mg total) by mouth at bedtime. 90 tablet 0  . vitamin B-12 (CYANOCOBALAMIN) 500 MCG tablet Take 1 tablet (500 mcg total) by mouth daily.    . Vitamin D, Ergocalciferol, (DRISDOL) 1.25 MG (50000 UNIT) CAPS capsule Take 1 capsule (50,000 Units total) by mouth every Monday. 5 capsule    No current facility-administered medications for this visit.     Musculoskeletal: Strength & Muscle Tone: N/A Gait & Station: N/A Patient leans: N/A  Psychiatric Specialty Exam: Review of Systems  There were no vitals taken for this visit.There is no height or weight on file to calculate BMI.  General Appearance: {Appearance:22683}  Eye Contact:   {BHH EYE CONTACT:22684}  Speech:  Clear and Coherent  Volume:  Normal  Mood:  {BHH MOOD:22306}  Affect:  {Affect (PAA):22687}  Thought Process:  Coherent  Orientation:  Full (Time, Place, and Person)  Thought Content: Logical   Suicidal Thoughts:  {ST/HT (PAA):22692}  Homicidal Thoughts:  {ST/HT (PAA):22692}  Memory:  Immediate;   Good  Judgement:  {Judgement (PAA):22694}  Insight:  {Insight (PAA):22695}  Psychomotor Activity:  Normal  Concentration:  Concentration: Good and Attention Span:  Good  Recall:  Good  Fund of Knowledge: Good  Language: Good  Akathisia:  No  Handed:  Right  AIMS (if indicated): not done  Assets:  Communication Skills Desire for Improvement  ADL's:  Intact  Cognition: WNL  Sleep:  {BHH GOOD/FAIR/POOR:22877}   Screenings:   Assessment and Plan:  Maria Pace is a 79 y.o. year old female with a history of schizophrenia,hypertension, type II diabetes, who presents for follow up appointment for below.     # Schizophrenia There has been no significant behavior issues since the last visit. No tremors since tapering off risperidone (used to be on 2 mg BID).  Will taper down olanzapine for schizophrenia given no significant change since uptitration of this medication. The staff agrees to continue work on behavioral modification.  Discussed potential metabolic side effect and EPS.  Will continue trazodone for insomnia.   # Cognitive deficits She does have cognitive deficits as characterized by Moca.We will continue to monitor.  Plan 1. Decrease olanzapine 10 mg at night  2. Please send lab result to our office (lipid, glucose) 3. Continue Trazodone 50 mg at night 4.[facility],Fax: [phone] 5. Next appointment:8/10 at 1 PM for 20 mins, video [phone]  The patient demonstrates the following risk factors for suicide: Chronic risk factors for suicide include:psychiatric disorder ofschizophrenia. Acute  risk factorsfor suicide include: unemployment. Protective factorsfor this patient include: positive social support. Considering these factors, the overall suicide risk at this point appears to below. Patientisappropriate for outpatient follow up.   [name], MD 10/30/2019, 4:29 PM

## 2019-11-05 ENCOUNTER — Other Ambulatory Visit: Payer: Self-pay

## 2019-11-05 ENCOUNTER — Telehealth (HOSPITAL_COMMUNITY): Payer: Medicare Other | Admitting: Psychiatry

## 2019-11-05 ENCOUNTER — Telehealth (HOSPITAL_COMMUNITY): Payer: Self-pay | Admitting: Psychiatry

## 2019-11-05 NOTE — Telephone Encounter (Signed)
Called for her appointment today. [name], at [facility] care answered the phone. She does not know where [name] is since she was discharged from the hospital .Although [name] tried to contact the nursing home where [name] might be, she has not heard back from yet. [name] agrees to contact our office for follow up when [name] is back.

## 2019-11-11 DIAGNOSIS — I10 Essential (primary) hypertension: Secondary | ICD-10-CM | POA: Diagnosis not present

## 2019-11-11 DIAGNOSIS — E119 Type 2 diabetes mellitus without complications: Secondary | ICD-10-CM | POA: Diagnosis not present

## 2019-11-11 DIAGNOSIS — F315 Bipolar disorder, current episode depressed, severe, with psychotic features: Secondary | ICD-10-CM | POA: Diagnosis not present

## 2019-11-25 ENCOUNTER — Other Ambulatory Visit: Payer: Self-pay

## 2019-11-25 ENCOUNTER — Encounter (HOSPITAL_COMMUNITY): Payer: Self-pay | Admitting: Psychiatry

## 2019-11-25 ENCOUNTER — Telehealth (INDEPENDENT_AMBULATORY_CARE_PROVIDER_SITE_OTHER): Payer: Medicare Other | Admitting: Psychiatry

## 2019-11-25 DIAGNOSIS — F209 Schizophrenia, unspecified: Secondary | ICD-10-CM | POA: Diagnosis not present

## 2019-11-25 DIAGNOSIS — R419 Unspecified symptoms and signs involving cognitive functions and awareness: Secondary | ICD-10-CM | POA: Diagnosis not present

## 2019-11-25 DIAGNOSIS — G4700 Insomnia, unspecified: Secondary | ICD-10-CM | POA: Diagnosis not present

## 2019-11-25 NOTE — Patient Instructions (Signed)
1. Continue olanzapine 20 mg at night 2. Continue zolpidem 5 mg at night 3. Next appointment- 10/11 at 11:30  4.[facility],Fax: [phone]

## 2019-11-25 NOTE — Progress Notes (Signed)
Virtual Visit via Video Note  I connected with Maria Pace on 11/25/19 at 10:00 AM EDT by a video enabled telemedicine application and verified that I am speaking with the correct person using two identifiers.   I discussed the limitations of evaluation and management by telemedicine and the availability of in person appointments. The patient expressed understanding and agreed to proceed.     I discussed the assessment and treatment plan with the patient. The patient was provided an opportunity to ask questions and all were answered. The patient agreed with the plan and demonstrated an understanding of the instructions.   The patient was advised to call back or seek an in-person evaluation if the symptoms worsen or if the condition fails to improve as anticipated.  Location: patient- group home, provider- office    I provided 15 minutes of non-face-to-face time during this encounter.   Maria Hotter, MD    [facility] MD/PA/NP OP Progress Note  11/25/2019 10:25 AM Maria Pace  MRN:  [id number]  Chief Complaint:  Chief Complaint    Follow-up; Schizophrenia     HPI:  - Patient went to ED for "becoming confused, locking herself in her room, then being found lying down on the floor with a broken bed, the TV knocked off the TV stand and the patient stating that everything in the roomthat hadcolor needed to be removed by the staffbecause anything with colorcontain the devil. " She was found to have hyponatremia, AKI.   This is a follow-up appointment for schizophrenia.  She states that she has been doing well.  She could not elaborate the reason she was admitted to the hospital  ("I needed to go there") except that she states she was in the hospital. She has been doing well since discharge.  She sleeps well.  She reports good appetite.  She denies feeling depressed.  She denies SI, HI.  She denies hallucinations or paranoia.  She denies any SI.Marland Kitchen    The staff presents to the  appointment.  Maria Pace went to the hospital due to "mental breakdown."  The staff also agreed that it occurred in the context of medical condition.  She has been doing well since discharge.  She tends to keep herself in the room.  She shuts down her door as she does not want to be bothered.  She spends time doing crochet.  The staff denies any behavioral concern otherwise.  She takes medication regularly.  She sleeps well at night.  She eats well.    Visit Diagnosis:    ICD-10-CM   1. Schizophrenia, unspecified type (HCC)  F20.9   2. Neurocognitive disorder  R41.9     Past Psychiatric History: Please see initial evaluation for full details. I have reviewed the history. No updates at this time.     Past Medical History:  Past Medical History:  Diagnosis Date  . Bipolar 1 disorder (HCC)   . Essential hypertension   . GERD (gastroesophageal reflux disease)   . Hyperlipidemia   . Irregular heart beat   . Schizophrenia (HCC)   . Type 2 diabetes mellitus (HCC)     Past Surgical History:  Procedure Laterality Date  . YAG LASER APPLICATION Left 01/06/2015   Procedure: YAG LASER APPLICATION;  Surgeon: Jethro Bolus, MD;  Location: [facility];  Service: Ophthalmology;  Laterality: Left;    Family Psychiatric History: Please see initial evaluation for full details. I have reviewed the history. No updates at this time.  Family History:  Family History  Problem Relation Age of Onset  . Hypertension Father   . Diabetes type II Father     Social History:  Social History   Socioeconomic History  . Marital status: Single    Spouse name: Not on file  . Number of children: Not on file  . Years of education: Not on file  . Highest education level: Not on file  Occupational History  . Not on file  Tobacco Use  . Smoking status: Never Smoker  . Smokeless tobacco: Never Used  Vaping Use  . Vaping Use: Never used  Substance and Sexual Activity  . Alcohol use: No  . Drug use: No  .  Sexual activity: Never  Other Topics Concern  . Not on file  Social History Narrative  . Not on file   Social Determinants of Health   Financial Resource Strain:   . Difficulty of Paying Living Expenses: Not on file  Food Insecurity:   . Worried About Programme researcher, broadcasting/film/videounning Out of Food in the Last Year: Not on file  . Ran Out of Food in the Last Year: Not on file  Transportation Needs:   . Lack of Transportation (Medical): Not on file  . Lack of Transportation (Non-Medical): Not on file  Physical Activity:   . Days of Exercise per Week: Not on file  . Minutes of Exercise per Session: Not on file  Stress:   . Feeling of Stress : Not on file  Social Connections:   . Frequency of Communication with Friends and Family: Not on file  . Frequency of Social Gatherings with Friends and Family: Not on file  . Attends Religious Services: Not on file  . Active Member of Clubs or Organizations: Not on file  . Attends BankerClub or Organization Meetings: Not on file  . Marital Status: Not on file    Allergies:  Allergies  Allergen Reactions  . Haloperidol Lactate Other (See Comments)    Couldn't urinate anymore    Metabolic Disorder Labs: Lab Results  Component Value Date   HGBA1C 6.3 (H) 10/03/2019   MPG 134.11 10/03/2019   No results found for: PROLACTIN Lab Results  Component Value Date   CHOL 168 10/10/2019   TRIG 54 10/10/2019   HDL 37 (L) 10/10/2019   CHOLHDL 4.5 10/10/2019   VLDL 11 10/10/2019   LDLCALC 120 (H) 10/10/2019   LDLCALC 99 10/06/2019   Lab Results  Component Value Date   TSH 1.481 10/06/2019   TSH 2.634 05/18/2017    Therapeutic Level Labs: No results found for: LITHIUM Lab Results  Component Value Date   VALPROATE 64 06/13/2018   VALPROATE 86.5 02/15/2018   No components found for:  CBMZ  Current Medications: Current Outpatient Medications  Medication Sig Dispense Refill  . acetaminophen (TYLENOL) 500 MG tablet Take 500 mg by mouth every 8 (eight) hours as  needed for mild pain or moderate pain.    Marland Kitchen. apixaban (ELIQUIS) 5 MG TABS tablet Take 1 tablet (5 mg total) by mouth 2 (two) times daily. 60 tablet   . atorvastatin (LIPITOR) 40 MG tablet Take 1 tablet (40 mg total) by mouth daily.    . Camphor-Eucalyptus-Menthol (VICKS VAPORUB) 4.7-1.2-2.6 % OINT Apply 1 application topically daily. Apply once to toenails daily    . docusate sodium (COLACE) 100 MG capsule Take 200 mg by mouth daily.    Marland Kitchen. latanoprost (XALATAN) 0.005 % ophthalmic solution Place 1 drop into both eyes at bedtime.    .Marland Kitchen  linagliptin (TRADJENTA) 5 MG TABS tablet Take 5 mg by mouth daily.    Marland Kitchen lisinopril (ZESTRIL) 2.5 MG tablet Take 1 tablet (2.5 mg total) by mouth daily.    Marland Kitchen loratadine (CLARITIN) 10 MG tablet Take 10 mg by mouth daily.    . metFORMIN (GLUCOPHAGE) 500 MG tablet Take 500 mg by mouth 2 (two) times daily with a meal.    . metoprolol tartrate (LOPRESSOR) 25 MG tablet Take 1 tablet (25 mg total) by mouth 2 (two) times daily.    Marland Kitchen OLANZapine (ZYPREXA) 10 MG tablet Take 1 tablet (10 mg total) by mouth at bedtime. 90 tablet 0  . pantoprazole (PROTONIX) 40 MG tablet Take 40 mg by mouth daily.    . polyethylene glycol powder (GLYCOLAX/MIRALAX) powder Take 17 g by mouth daily as needed for mild constipation or moderate constipation.    . senna-docusate (SENOKOT-S) 8.6-50 MG tablet Take 2 tablets by mouth at bedtime. 60 tablet 0  . vitamin B-12 (CYANOCOBALAMIN) 500 MCG tablet Take 1 tablet (500 mcg total) by mouth daily.    . Vitamin D, Ergocalciferol, (DRISDOL) 1.25 MG (50000 UNIT) CAPS capsule Take 1 capsule (50,000 Units total) by mouth every Monday. 5 capsule    No current facility-administered medications for this visit.     Musculoskeletal: Strength & Muscle Tone: N/A Gait & Station: N/A Patient leans: N/A  Psychiatric Specialty Exam: Review of Systems  Psychiatric/Behavioral: Negative for agitation, behavioral problems, confusion, decreased concentration, dysphoric  mood, hallucinations, self-injury, sleep disturbance and suicidal ideas. The patient is not nervous/anxious and is not hyperactive.   All other systems reviewed and are negative.   There were no vitals taken for this visit.There is no height or weight on file to calculate BMI.  General Appearance: Fairly Groomed  Eye Contact:  Good  Speech:  Clear and Coherent  Volume:  Normal  Mood:  good  Affect:  Appropriate, Congruent and slightly restricted  Thought Process:  Irrelevant at times  Orientation:  Full (Time, Place, and Person)  Thought Content: Logical   Suicidal Thoughts:  No  Homicidal Thoughts:  No  Memory:  Immediate;   Poor  Judgement:  Fair  Insight:  Shallow  Psychomotor Activity:  Normal  Concentration:  Concentration: Good and Attention Span: Good  Recall:  [employer] of Knowledge: Fair  Language: Good  Akathisia:  No  Handed:  Right  AIMS (if indicated): not done  Assets:  Social Support  ADL's:  Intact  Cognition: WNL  Sleep:  Good   Screenings:   Assessment and Plan:  JASIMINE SIMMS is a 79 y.o. year old female with a history of  schizophrenia,hypertension, type II diabetes, who presents for follow up appointment for below.   1. Schizophrenia, unspecified type (HCC) She was admitted due to altered mental status in the context of hyponatremia and AKI.  Her olanzapine was reportedly uptitrated since this recent admission.  Will obtain records for collateral.  Will continue current dose of olanzapine at this time to target schizophrenia.  Will consider tapering it down in the future to avoid metabolic side effect and EPS, and increased mortality given her cognitive deficits.   # Insomnia Will continue Ambien at this time for insomnia. Will consider switching back to Trazodone at the next encounter after obtaining collaterals.   2. Neurocognitive disorder She does have cognitive deficits as characterized by Moca.We will continue to monitor.  Plan 1.  Continue olanzapine 20 mg at night- pharmacy will contact us  if she needs refill 2. Continue zolpidem 5 mg at night-  pharmacy will contact us if she needs refill 3. Next appointment- 10/11 at 11:30 for 30 mins, video [phone] 4.[facility],Fax: [phone] - Family care to mail recent admission record from the hospital  The patient demonstrates the following risk factors for suicide: Chronic risk factors for suicide include:psychiatric disorder ofschizophrenia. Acute risk factorsfor suicide include: unemployment. Protective factorsfor this patient include: positive social support. Considering these factors, the overall suicide risk at this point appears to below. Patientisappropriate for outpatient follow up.  Maria Hotter, MD 11/25/2019, 10:25 AM

## 2019-11-27 ENCOUNTER — Telehealth (HOSPITAL_COMMUNITY): Payer: Self-pay | Admitting: Psychiatry

## 2019-11-27 NOTE — Addendum Note (Signed)
Addended by: [name] on: 11/27/2019 09:36 AM   Modules accepted: Orders

## 2019-11-27 NOTE — Telephone Encounter (Signed)
Reviewed [facility] discharge note, dated 8/11. Discharge diagnosis: Major neurocognitive disorder, psychosis. No detailed hospital course was documented.

## 2019-12-12 DIAGNOSIS — I10 Essential (primary) hypertension: Secondary | ICD-10-CM | POA: Diagnosis not present

## 2019-12-12 DIAGNOSIS — E1165 Type 2 diabetes mellitus with hyperglycemia: Secondary | ICD-10-CM | POA: Diagnosis not present

## 2019-12-17 DIAGNOSIS — E1142 Type 2 diabetes mellitus with diabetic polyneuropathy: Secondary | ICD-10-CM | POA: Diagnosis not present

## 2019-12-17 DIAGNOSIS — I739 Peripheral vascular disease, unspecified: Secondary | ICD-10-CM | POA: Diagnosis not present

## 2019-12-17 DIAGNOSIS — B351 Tinea unguium: Secondary | ICD-10-CM | POA: Diagnosis not present

## 2020-01-01 NOTE — Progress Notes (Signed)
Virtual Visit via Video Note  I connected with Maria Pace on 01/06/20 at 11:30 AM EDT by a video enabled telemedicine application and verified that I am speaking with the correct person using two identifiers.   I discussed the limitations of evaluation and management by telemedicine and the availability of in person appointments. The patient expressed understanding and agreed to proceed.   I discussed the assessment and treatment plan with the patient. The patient was provided an opportunity to ask questions and all were answered. The patient agreed with the plan and demonstrated an understanding of the instructions.   The patient was advised to call back or seek an in-person evaluation if the symptoms worsen or if the condition fails to improve as anticipated.  Location: patient- home, provider- office   I provided 15 minutes of non-face-to-face time during this encounter.   [name], MD    [facility] MD/PA/NP OP Progress Note  01/06/2020 12:01 PM Maria Pace  MRN:  [id number]  Chief Complaint:  Chief Complaint    Follow-up; Schizophrenia     HPI:  This is a follow-up appointment for schizophrenia   She states that she has been doing well.  She has been busy fixing her hair.  She enjoys doing crochet, and drawing pictures.  She states that [facility], referring that [facility] are other people.  She denies insomnia.  She has good appetite.  She denies feeling depressed.  She denies AH, VH, paranoia.  Although she does not have any friends at the group home, she thinks people are treating her well.   Ms. Maisie Fus, and Maria Pace are present to the interview.  Maria Pace has been doing very well since the last visit.  She takes medication regularly.  No safety or behavioral concerns.  When it is discussed to taper down olanzapine, both of them has concerns about this due to the recent patient behavior, which led her to the hospital.    Employment:  unemployed. used to work for [employer], 20's Household:  [facility] Marital status: single Number of children: 0  Visit Diagnosis:    ICD-10-CM   1. Schizophrenia, unspecified type (HCC)  F20.9     Past Psychiatric History: Please see initial evaluation for full details. I have reviewed the history. No updates at this time.     Past Medical History:  Past Medical History:  Diagnosis Date  . Bipolar 1 disorder (HCC)   . Essential hypertension   . GERD (gastroesophageal reflux disease)   . Hyperlipidemia   . Irregular heart beat   . Schizophrenia (HCC)   . Type 2 diabetes mellitus (HCC)     Past Surgical History:  Procedure Laterality Date  . YAG LASER APPLICATION Left 01/06/2015   Procedure: YAG LASER APPLICATION;  Surgeon: Maria Bolus, MD;  Location: [facility];  Service: Ophthalmology;  Laterality: Left;    Family Psychiatric History: Please see initial evaluation for full details. I have reviewed the history. No updates at this time.     Family History:  Family History  Problem Relation Age of Onset  . Hypertension Father   . Diabetes type II Father     Social History:  Social History   Socioeconomic History  . Marital status: Single    Spouse name: Not on file  . Number of children: Not on file  . Years of education: Not on file  . Highest education level: Not on file  Occupational  History  . Not on file  Tobacco Use  . Smoking status: Never Smoker  . Smokeless tobacco: Never Used  Vaping Use  . Vaping Use: Never used  Substance and Sexual Activity  . Alcohol use: No  . Drug use: No  . Sexual activity: Never  Other Topics Concern  . Not on file  Social History Narrative  . Not on file   Social Determinants of Health   Financial Resource Strain:   . Difficulty of Paying Living Expenses: Not on file  Food Insecurity:   . Worried About Programme researcher, broadcasting/film/video in the Last Year: Not on file  . Ran Out of Food in the Last Year: Not on file   Transportation Needs:   . Lack of Transportation (Medical): Not on file  . Lack of Transportation (Non-Medical): Not on file  Physical Activity:   . Days of Exercise per Week: Not on file  . Minutes of Exercise per Session: Not on file  Stress:   . Feeling of Stress : Not on file  Social Connections:   . Frequency of Communication with Friends and Family: Not on file  . Frequency of Social Gatherings with Friends and Family: Not on file  . Attends Religious Services: Not on file  . Active Member of Clubs or Organizations: Not on file  . Attends Banker Meetings: Not on file  . Marital Status: Not on file    Allergies:  Allergies  Allergen Reactions  . Haloperidol Lactate Other (See Comments)    Couldn't urinate anymore    Metabolic Disorder Labs: Lab Results  Component Value Date   HGBA1C 6.3 (H) 10/03/2019   MPG 134.11 10/03/2019   No results found for: PROLACTIN Lab Results  Component Value Date   CHOL 168 10/10/2019   TRIG 54 10/10/2019   HDL 37 (L) 10/10/2019   CHOLHDL 4.5 10/10/2019   VLDL 11 10/10/2019   LDLCALC 120 (H) 10/10/2019   LDLCALC 99 10/06/2019   Lab Results  Component Value Date   TSH 1.481 10/06/2019   TSH 2.634 05/18/2017    Therapeutic Level Labs: No results found for: LITHIUM Lab Results  Component Value Date   VALPROATE 64 06/13/2018   VALPROATE 86.5 02/15/2018   No components found for:  CBMZ  Current Medications: Current Outpatient Medications  Medication Sig Dispense Refill  . apixaban (ELIQUIS) 5 MG TABS tablet Take 1 tablet (5 mg total) by mouth 2 (two) times daily. 60 tablet   . Camphor-Eucalyptus-Menthol (VICKS VAPORUB) 4.7-1.2-2.6 % OINT Apply 1 application topically daily. Apply once to toenails daily    . cholecalciferol (VITAMIN D3) 10 MCG (400 UNIT) TABS tablet Take 400 Units by mouth.    . docusate sodium (COLACE) 100 MG capsule Take 200 mg by mouth daily.    Marland Kitchen gabapentin (NEURONTIN) 400 MG capsule Take  400 mg by mouth at bedtime.    . insulin glargine (LANTUS) 100 UNIT/ML injection Inject 10 Units into the skin daily.    Marland Kitchen latanoprost (XALATAN) 0.005 % ophthalmic solution Place 1 drop into both eyes at bedtime.    Marland Kitchen lisinopril (ZESTRIL) 2.5 MG tablet Take 1 tablet (2.5 mg total) by mouth daily.    Marland Kitchen loratadine (CLARITIN) 10 MG tablet Take 10 mg by mouth daily.    Marland Kitchen loratadine (CLARITIN) 10 MG tablet Take 10 mg by mouth daily.    . metFORMIN (GLUCOPHAGE) 500 MG tablet Take 500 mg by mouth 2 (two) times daily with a  meal.    . metoprolol tartrate (LOPRESSOR) 25 MG tablet Take 1 tablet (25 mg total) by mouth 2 (two) times daily.    . Multiple Vitamin (MULTIVITAMIN) tablet Take 1 tablet by mouth daily.    Marland Kitchen. OLANZapine (ZYPREXA) 10 MG tablet Take 1 tablet (10 mg total) by mouth at bedtime. 90 tablet 0  . pantoprazole (PROTONIX) 40 MG tablet Take 40 mg by mouth daily.    . polyethylene glycol powder (GLYCOLAX/MIRALAX) powder Take 17 g by mouth daily as needed for mild constipation or moderate constipation.    . senna-docusate (SENOKOT-S) 8.6-50 MG tablet Take 2 tablets by mouth at bedtime. 60 tablet 0  . simvastatin (ZOCOR) 80 MG tablet Take 80 mg by mouth daily.    . sitaGLIPtin (JANUVIA) 25 MG tablet Take 25 mg by mouth daily.    . vitamin B-12 (CYANOCOBALAMIN) 500 MCG tablet Take 1 tablet (500 mcg total) by mouth daily.    . Vitamin D, Ergocalciferol, (DRISDOL) 1.25 MG (50000 UNIT) CAPS capsule Take 1 capsule (50,000 Units total) by mouth every Monday. 5 capsule   . zolpidem (AMBIEN) 5 MG tablet Take 5 mg by mouth at bedtime as needed for sleep.     No current facility-administered medications for this visit.     Musculoskeletal: Strength & Muscle Tone: N/A Gait & Station: N/A Patient leans: N/A  Psychiatric Specialty Exam: Review of Systems  Psychiatric/Behavioral: Negative for agitation, behavioral problems, confusion, decreased concentration, dysphoric mood, hallucinations,  self-injury, sleep disturbance and suicidal ideas. The patient is not nervous/anxious and is not hyperactive.   All other systems reviewed and are negative.   There were no vitals taken for this visit.There is no height or weight on file to calculate BMI.  General Appearance: Fairly Groomed  Eye Contact:  Good  Speech:  Clear and Coherent  Volume:  Normal  Mood:  good  Affect:  Appropriate, Congruent and Restricted  Thought Process:  Coherent  Orientation:  Full (Time, Place, and Person)  Thought Content: Logical   Suicidal Thoughts:  No  Homicidal Thoughts:  No  Memory:  Immediate;   Good  Judgement:  Good  Insight:  Fair  Psychomotor Activity:  Normal  Concentration:  Concentration: Good and Attention Span: Good  Recall:  Good  Fund of Knowledge: Good  Language: Good  Akathisia:  No  Handed:  Right  AIMS (if indicated): not done  Assets:  Communication Skills Desire for Improvement  ADL's:  Intact  Cognition: WNL  Sleep:  Good   Screenings:   Assessment and Plan:  Maria PopDorothy A Pace is a 79 y.o. year old female with a history of schizophrenia,hypertension, type II diabetes, who presents for follow up appointment for below.   1. Schizophrenia, unspecified type (HCC) There has been no significant behavioral concerns since the last visit.  Noted that her olanzapine dose was uptitrated when she was admitted for altered mental status in the context of hyponatremia and AKI.  Although it was discussed with the staff to taper her down olanzapine to avoid potential long-term side effect, the staff has strong preference to stay on the current dose of medication.  Will continue current dose at this time to target schizophrenia.  Noted that there is a concern of her having neurocognitive disorder; will discuss again given its potential risk of increased mortality.   2. Neurocognitive disorder She does have cognitive deficits as characterized by [place].We will continue to  monitor.  Plan I have reviewed and updated plans  as below 1. Continue olanzapine 20 mg at night (- pharmacy will contact us if she needs refill) 2. Continue Zolpidem 5 mg at night - ordered by PCP 3. Next appointment-  1/10 at 11 AM for 30 mins, [phone] - The staff wants her sister to be explained about her medication. They are advised to have the sister contact our office to obtain consent form 4.[facility],Fax: [phone]  The patient demonstrates the following risk factors for suicide: Chronic risk factors for suicide include:psychiatric disorder ofschizophrenia. Acute risk factorsfor suicide include: unemployment. Protective factorsfor this patient include: positive social support. Considering these factors, the overall suicide risk at this point appears to below. Patientisappropriate for outpatient follow up.   [name], MD 01/06/2020, 12:01 PM

## 2020-01-06 ENCOUNTER — Encounter (HOSPITAL_COMMUNITY): Payer: Self-pay | Admitting: Psychiatry

## 2020-01-06 ENCOUNTER — Telehealth (INDEPENDENT_AMBULATORY_CARE_PROVIDER_SITE_OTHER): Payer: Medicare Other | Admitting: Psychiatry

## 2020-01-06 ENCOUNTER — Other Ambulatory Visit: Payer: Self-pay

## 2020-01-06 DIAGNOSIS — F209 Schizophrenia, unspecified: Secondary | ICD-10-CM | POA: Diagnosis not present

## 2020-01-06 DIAGNOSIS — F315 Bipolar disorder, current episode depressed, severe, with psychotic features: Secondary | ICD-10-CM | POA: Diagnosis not present

## 2020-01-06 DIAGNOSIS — E119 Type 2 diabetes mellitus without complications: Secondary | ICD-10-CM | POA: Diagnosis not present

## 2020-01-06 DIAGNOSIS — F203 Undifferentiated schizophrenia: Secondary | ICD-10-CM | POA: Diagnosis not present

## 2020-01-06 DIAGNOSIS — Z23 Encounter for immunization: Secondary | ICD-10-CM | POA: Diagnosis not present

## 2020-01-06 DIAGNOSIS — I10 Essential (primary) hypertension: Secondary | ICD-10-CM | POA: Diagnosis not present

## 2020-01-06 NOTE — Patient Instructions (Signed)
1. Continue olanzapine 20 mg at night 2. Continue Zolpidem 5 mg at night  3. Next appointment-  1/10 at 11 AM 4.[facility],Fax: [phone]

## 2020-02-06 DIAGNOSIS — I10 Essential (primary) hypertension: Secondary | ICD-10-CM | POA: Diagnosis not present

## 2020-02-06 DIAGNOSIS — E1165 Type 2 diabetes mellitus with hyperglycemia: Secondary | ICD-10-CM | POA: Diagnosis not present

## 2020-03-03 DIAGNOSIS — E1142 Type 2 diabetes mellitus with diabetic polyneuropathy: Secondary | ICD-10-CM | POA: Diagnosis not present

## 2020-03-03 DIAGNOSIS — I739 Peripheral vascular disease, unspecified: Secondary | ICD-10-CM | POA: Diagnosis not present

## 2020-03-03 DIAGNOSIS — B351 Tinea unguium: Secondary | ICD-10-CM | POA: Diagnosis not present

## 2020-03-07 DIAGNOSIS — I10 Essential (primary) hypertension: Secondary | ICD-10-CM | POA: Diagnosis not present

## 2020-03-07 DIAGNOSIS — E1165 Type 2 diabetes mellitus with hyperglycemia: Secondary | ICD-10-CM | POA: Diagnosis not present

## 2020-03-16 DIAGNOSIS — Z961 Presence of intraocular lens: Secondary | ICD-10-CM | POA: Diagnosis not present

## 2020-03-16 DIAGNOSIS — H401131 Primary open-angle glaucoma, bilateral, mild stage: Secondary | ICD-10-CM | POA: Diagnosis not present

## 2020-03-16 DIAGNOSIS — E119 Type 2 diabetes mellitus without complications: Secondary | ICD-10-CM | POA: Diagnosis not present

## 2020-03-30 NOTE — Progress Notes (Signed)
Virtual Visit via Video Note  I connected with Maria Pace on 04/06/20 at 11:00 AM EST by a video enabled telemedicine application and verified that I am speaking with the correct person using two identifiers.  Location: Patient: Maria Pace Provider: office Persons participated in the visit- patient, provider   I discussed the limitations of evaluation and management by telemedicine and the availability of in person appointments. The patient expressed understanding and agreed to proceed.   I discussed the assessment and treatment plan with the patient. The patient was provided an opportunity to ask questions and all were answered. The patient agreed with the plan and demonstrated an understanding of the instructions.   The patient was advised to call back or seek an in-person evaluation if the symptoms worsen or if the condition fails to improve as anticipated.  I provided 15 minutes of non-face-to-face time during this encounter.   Norman Clay, MD    [facility] MD/PA/NP OP Progress Note  04/06/2020 12:06 PM Maria Pace  MRN:  [id number]  Chief Complaint:  Chief Complaint    Other; Follow-up     HPI:  This is a follow up appointment for schizophrenia.  She states that she has been sick.  She states that she is not having any flu or COVID.  When she is asked the episode of her not allowing others to come into her room, she agrees that she talks with lord. She feels safe in the facility, and she denies any concern.  She states that she made 3 clothes, and stretch dress from [place].  when she is asked about her medication, she talks about "sugar pill. " She denies paranoia. She denies AH, VH, ideas of reference.    Maria Pace presents to the interview.  Maria Pace has been doing very well. However, she does not allow other people in her room, stating that she is talking to [place]. She does have any other behavioral issues. No safety concern. The staff is hoping for  [facility]. Maria Pace does crochet, and had made three dresses. She sleeps well, and has good appetite. She takes medication regularly.    Employment: unemployed.used to work for [employer], 20's Household:  [facility] Marital status: single Number of children: 0  Visit Diagnosis:    ICD-10-CM   1. Schizophrenia, unspecified type ([place])  F20.9   2. Neurocognitive disorder  R41.9     Past Psychiatric History: Please see initial evaluation for full details. I have reviewed the history. No updates at this time.     Past Medical History:  Past Medical History:  Diagnosis Date  . Bipolar 1 disorder ([place])   . Essential hypertension   . GERD (gastroesophageal reflux disease)   . Hyperlipidemia   . Irregular heart beat   . Schizophrenia ([place])   . Type 2 diabetes mellitus ([place])     Past Surgical History:  Procedure Laterality Date  . YAG LASER APPLICATION Left 86/76/7209   Procedure: YAG LASER APPLICATION;  Surgeon: Rutherford Guys, MD;  Location: [facility];  Service: Ophthalmology;  Laterality: Left;    Family Psychiatric History: Please see initial evaluation for full details. I have reviewed the history. No updates at this time.     Family History:  Family History  Problem Relation Age of Onset  . Hypertension Father   . Diabetes type II Father     Social History:  Social History   Socioeconomic History  .  Marital status: Single    Spouse name: Not on file  . Number of children: Not on file  . Years of education: Not on file  . Highest education level: Not on file  Occupational History  . Not on file  Tobacco Use  . Smoking status: Never Smoker  . Smokeless tobacco: Never Used  Vaping Use  . Vaping Use: Never used  Substance and Sexual Activity  . Alcohol use: No  . Drug use: No  . Sexual activity: Never  Other Topics Concern  . Not on file  Social History Narrative  . Not on file   Social Determinants of Health    Financial Resource Strain: Not on file  Food Insecurity: Not on file  Transportation Needs: Not on file  Physical Activity: Not on file  Stress: Not on file  Social Connections: Not on file    Allergies:  Allergies  Allergen Reactions  . Haloperidol Lactate Other (See Comments)    Couldn't urinate anymore    Metabolic Disorder Labs: Lab Results  Component Value Date   HGBA1C 6.3 (H) 10/03/2019   MPG 134.11 10/03/2019   No results found for: PROLACTIN Lab Results  Component Value Date   CHOL 168 10/10/2019   TRIG 54 10/10/2019   HDL 37 (L) 10/10/2019   CHOLHDL 4.5 10/10/2019   VLDL 11 10/10/2019   LDLCALC 120 (H) 10/10/2019   LDLCALC 99 10/06/2019   Lab Results  Component Value Date   TSH 1.481 10/06/2019   TSH 2.634 05/18/2017    Therapeutic Level Labs: No results found for: LITHIUM Lab Results  Component Value Date   VALPROATE 64 06/13/2018   VALPROATE 86.5 02/15/2018   No components found for:  CBMZ  Current Medications: Current Outpatient Medications  Medication Sig Dispense Refill  . apixaban (ELIQUIS) 5 MG TABS tablet Take 1 tablet (5 mg total) by mouth 2 (two) times daily. 60 tablet   . Camphor-Eucalyptus-Menthol (VICKS VAPORUB) 4.7-1.2-2.6 % OINT Apply 1 application topically daily. Apply once to toenails daily    . cholecalciferol (VITAMIN D3) 10 MCG (400 UNIT) TABS tablet Take 400 Units by mouth.    . docusate sodium (COLACE) 100 MG capsule Take 200 mg by mouth daily.    Marland Kitchen gabapentin (NEURONTIN) 400 MG capsule Take 400 mg by mouth at bedtime.    . insulin glargine (LANTUS) 100 UNIT/ML injection Inject 10 Units into the skin daily.    Marland Kitchen latanoprost (XALATAN) 0.005 % ophthalmic solution Place 1 drop into both eyes at bedtime.    Marland Kitchen lisinopril (ZESTRIL) 2.5 MG tablet Take 1 tablet (2.5 mg total) by mouth daily.    Marland Kitchen loratadine (CLARITIN) 10 MG tablet Take 10 mg by mouth daily.    Marland Kitchen loratadine (CLARITIN) 10 MG tablet Take 10 mg by mouth daily.    .  metFORMIN (GLUCOPHAGE) 500 MG tablet Take 500 mg by mouth 2 (two) times daily with a meal.    . metoprolol tartrate (LOPRESSOR) 25 MG tablet Take 1 tablet (25 mg total) by mouth 2 (two) times daily.    . Multiple Vitamin (MULTIVITAMIN) tablet Take 1 tablet by mouth daily.    Marland Kitchen OLANZapine (ZYPREXA) 10 MG tablet Take 1 tablet (10 mg total) by mouth at bedtime. 90 tablet 0  . pantoprazole (PROTONIX) 40 MG tablet Take 40 mg by mouth daily.    . polyethylene glycol powder (GLYCOLAX/MIRALAX) powder Take 17 g by mouth daily as needed for mild constipation or moderate constipation.    Marland Kitchen  senna-docusate (SENOKOT-S) 8.6-50 MG tablet Take 2 tablets by mouth at bedtime. 60 tablet 0  . simvastatin (ZOCOR) 80 MG tablet Take 80 mg by mouth daily.    . sitaGLIPtin (JANUVIA) 25 MG tablet Take 25 mg by mouth daily.    . vitamin B-12 (CYANOCOBALAMIN) 500 MCG tablet Take 1 tablet (500 mcg total) by mouth daily.    . Vitamin D, Ergocalciferol, (DRISDOL) 1.25 MG (50000 UNIT) CAPS capsule Take 1 capsule (50,000 Units total) by mouth every Monday. 5 capsule   . zolpidem (AMBIEN) 5 MG tablet Take 5 mg by mouth at bedtime as needed for sleep.     No current facility-administered medications for this visit.     Musculoskeletal: Strength & Muscle Tone: N/A Gait & Station: N/A Patient leans: N/A  Psychiatric Specialty Exam: Review of Systems  Psychiatric/Behavioral: Negative for agitation, behavioral problems, confusion, decreased concentration, dysphoric mood, hallucinations, self-injury, sleep disturbance and suicidal ideas. The patient is not nervous/anxious and is not hyperactive.   All other systems reviewed and are negative.   There were no vitals taken for this visit.There is no height or weight on file to calculate BMI.  General Appearance: Fairly Groomed  Eye Contact:  Good  Speech:  Clear and Coherent  Volume:  Normal  Mood:  good  Affect:  Appropriate, Congruent and calm  Thought Process:  Coherent,  irrelevant at times  Orientation:  Full (Time, Place, and Person) except date  Thought Content: Logical   Suicidal Thoughts:  No  Homicidal Thoughts:  No  Memory:  Immediate;   Good  Judgement:  Fair  Insight:  Shallow  Psychomotor Activity:  Normal  Concentration:  Concentration: Good and Attention Span: Good  Recall:  Good  Fund of Knowledge: Good  Language: Good  Akathisia:  No  Handed:  Right  AIMS (if indicated): not done  Assets:  Communication Skills Desire for Improvement  ADL's:  Intact  Cognition: WNL  Sleep:  Good   Screenings:   Assessment and Plan:  CLARITA MCELVAIN is a 80 y.o. year old female with a history of  schizophrenia,hypertension, type II diabetes , who presents for follow up appointment for below.   1. Schizophrenia, unspecified type (HCC) She occasionally isolates herself due to her speaking to lord, there has been no other significant behavior issues since the last visit.  We will continue current dose of olanzapine to target schizophrenia.  Noted that she has been on the current dose of olanzapine since she was admitted for altered mental status in the context of hyponatremia/AKI. The staff has a strong preference to continue the current dose despite its potential risk of increased mortality for patient was neurocognitive disorder.  We will continue to monitor EPS, metabolic side effect.   # Insomnia She has good benefit from Ambien.  We will continue current dose with her insomnia.  Discussed potential risk of drowsiness.   2. Neurocognitive disorder She does have cognitive deficits as characterized by [place].  We will continue to monitor.   Plan I have reviewed and updated plans as below 1. Continue olanzapine 20 mg at night (- pharmacy will contact us if she needs refill) 2. Continue Zolpidem 5 mg at night as needed for sleep- the staff will contact us when they need a refill 3. Next appointment- 4/11 at 11 AM for 30 mins, video [phone] [facility].   4.[facility],Fax: [phone]  The patient demonstrates the following risk factors for suicide: Chronic risk  factors for suicide include:psychiatric disorder ofschizophrenia. Acute risk factorsfor suicide include: unemployment. Protective factorsfor this patient include: positive social support. Considering these factors, the overall suicide risk at this point appears to below. Patientisappropriate for outpatient follow up.  [name], MD 04/06/2020, 12:06 PM

## 2020-04-06 ENCOUNTER — Other Ambulatory Visit: Payer: Self-pay

## 2020-04-06 ENCOUNTER — Telehealth (HOSPITAL_COMMUNITY): Payer: Medicare Other | Admitting: Psychiatry

## 2020-04-06 ENCOUNTER — Encounter: Payer: Self-pay | Admitting: Psychiatry

## 2020-04-06 ENCOUNTER — Telehealth (INDEPENDENT_AMBULATORY_CARE_PROVIDER_SITE_OTHER): Payer: Medicare Other | Admitting: Psychiatry

## 2020-04-06 DIAGNOSIS — F209 Schizophrenia, unspecified: Secondary | ICD-10-CM

## 2020-04-06 DIAGNOSIS — R419 Unspecified symptoms and signs involving cognitive functions and awareness: Secondary | ICD-10-CM

## 2020-04-06 DIAGNOSIS — G4700 Insomnia, unspecified: Secondary | ICD-10-CM | POA: Diagnosis not present

## 2020-04-08 DIAGNOSIS — F315 Bipolar disorder, current episode depressed, severe, with psychotic features: Secondary | ICD-10-CM | POA: Diagnosis not present

## 2020-04-08 DIAGNOSIS — E1165 Type 2 diabetes mellitus with hyperglycemia: Secondary | ICD-10-CM | POA: Diagnosis not present

## 2020-04-21 DIAGNOSIS — Z23 Encounter for immunization: Secondary | ICD-10-CM | POA: Diagnosis not present

## 2020-05-09 DIAGNOSIS — E119 Type 2 diabetes mellitus without complications: Secondary | ICD-10-CM | POA: Diagnosis not present

## 2020-05-09 DIAGNOSIS — I10 Essential (primary) hypertension: Secondary | ICD-10-CM | POA: Diagnosis not present

## 2020-05-12 DIAGNOSIS — E1142 Type 2 diabetes mellitus with diabetic polyneuropathy: Secondary | ICD-10-CM | POA: Diagnosis not present

## 2020-05-12 DIAGNOSIS — I739 Peripheral vascular disease, unspecified: Secondary | ICD-10-CM | POA: Diagnosis not present

## 2020-05-12 DIAGNOSIS — B351 Tinea unguium: Secondary | ICD-10-CM | POA: Diagnosis not present

## 2020-06-07 DIAGNOSIS — E119 Type 2 diabetes mellitus without complications: Secondary | ICD-10-CM | POA: Diagnosis not present

## 2020-06-07 DIAGNOSIS — I10 Essential (primary) hypertension: Secondary | ICD-10-CM | POA: Diagnosis not present

## 2020-06-29 DIAGNOSIS — Z23 Encounter for immunization: Secondary | ICD-10-CM | POA: Diagnosis not present

## 2020-06-29 DIAGNOSIS — F315 Bipolar disorder, current episode depressed, severe, with psychotic features: Secondary | ICD-10-CM | POA: Diagnosis not present

## 2020-06-29 DIAGNOSIS — Z0001 Encounter for general adult medical examination with abnormal findings: Secondary | ICD-10-CM | POA: Diagnosis not present

## 2020-06-29 DIAGNOSIS — E559 Vitamin D deficiency, unspecified: Secondary | ICD-10-CM | POA: Diagnosis not present

## 2020-06-29 DIAGNOSIS — Z1389 Encounter for screening for other disorder: Secondary | ICD-10-CM | POA: Diagnosis not present

## 2020-06-29 DIAGNOSIS — Z1331 Encounter for screening for depression: Secondary | ICD-10-CM | POA: Diagnosis not present

## 2020-06-29 DIAGNOSIS — Z6839 Body mass index (BMI) 39.0-39.9, adult: Secondary | ICD-10-CM | POA: Diagnosis not present

## 2020-06-29 DIAGNOSIS — Z79899 Other long term (current) drug therapy: Secondary | ICD-10-CM | POA: Diagnosis not present

## 2020-06-29 DIAGNOSIS — E119 Type 2 diabetes mellitus without complications: Secondary | ICD-10-CM | POA: Diagnosis not present

## 2020-06-29 DIAGNOSIS — I10 Essential (primary) hypertension: Secondary | ICD-10-CM | POA: Diagnosis not present

## 2020-06-29 NOTE — Progress Notes (Deleted)
BH MD/PA/NP OP Progress Note  06/29/2020 11:16 AM Maria Pace  MRN:  [id number]  Chief Complaint:  HPI: *** Visit Diagnosis: No diagnosis found.  Past Psychiatric History: Please see initial evaluation for full details. I have reviewed the history. No updates at this time.     Past Medical History:  Past Medical History:  Diagnosis Date  . Bipolar 1 disorder (HCC)   . Essential hypertension   . GERD (gastroesophageal reflux disease)   . Hyperlipidemia   . Irregular heart beat   . Schizophrenia (HCC)   . Type 2 diabetes mellitus (HCC)     Past Surgical History:  Procedure Laterality Date  . YAG LASER APPLICATION Left 01/06/2015   Procedure: YAG LASER APPLICATION;  Surgeon: Jethro Bolus, MD;  Location: [facility];  Service: Ophthalmology;  Laterality: Left;    Family Psychiatric History: Please see initial evaluation for full details. I have reviewed the history. No updates at this time.     Family History:  Family History  Problem Relation Age of Onset  . Hypertension Father   . Diabetes type II Father     Social History:  Social History   Socioeconomic History  . Marital status: Single    Spouse name: Not on file  . Number of children: Not on file  . Years of education: Not on file  . Highest education level: Not on file  Occupational History  . Not on file  Tobacco Use  . Smoking status: Never Smoker  . Smokeless tobacco: Never Used  Vaping Use  . Vaping Use: Never used  Substance and Sexual Activity  . Alcohol use: No  . Drug use: No  . Sexual activity: Never  Other Topics Concern  . Not on file  Social History Narrative  . Not on file   Social Determinants of Health   Financial Resource Strain: Not on file  Food Insecurity: Not on file  Transportation Needs: Not on file  Physical Activity: Not on file  Stress: Not on file  Social Connections: Not on file    Allergies:  Allergies  Allergen Reactions  . Haloperidol Lactate Other (See  Comments)    Couldn't urinate anymore    Metabolic Disorder Labs: Lab Results  Component Value Date   HGBA1C 6.3 (H) 10/03/2019   MPG 134.11 10/03/2019   No results found for: PROLACTIN Lab Results  Component Value Date   CHOL 168 10/10/2019   TRIG 54 10/10/2019   HDL 37 (L) 10/10/2019   CHOLHDL 4.5 10/10/2019   VLDL 11 10/10/2019   LDLCALC 120 (H) 10/10/2019   LDLCALC 99 10/06/2019   Lab Results  Component Value Date   TSH 1.481 10/06/2019   TSH 2.634 05/18/2017    Therapeutic Level Labs: No results found for: LITHIUM Lab Results  Component Value Date   VALPROATE 64 06/13/2018   VALPROATE 86.5 02/15/2018   No components found for:  CBMZ  Current Medications: Current Outpatient Medications  Medication Sig Dispense Refill  . apixaban (ELIQUIS) 5 MG TABS tablet Take 1 tablet (5 mg total) by mouth 2 (two) times daily. 60 tablet   . Camphor-Eucalyptus-Menthol (VICKS VAPORUB) 4.7-1.2-2.6 % OINT Apply 1 application topically daily. Apply once to toenails daily    . cholecalciferol (VITAMIN D3) 10 MCG (400 UNIT) TABS tablet Take 400 Units by mouth.    . docusate sodium (COLACE) 100 MG capsule Take 200 mg by mouth daily.    Marland Kitchen gabapentin (NEURONTIN) 400 MG capsule Take 400  mg by mouth at bedtime.    . insulin glargine (LANTUS) 100 UNIT/ML injection Inject 10 Units into the skin daily.    Marland Kitchen latanoprost (XALATAN) 0.005 % ophthalmic solution Place 1 drop into both eyes at bedtime.    Marland Kitchen lisinopril (ZESTRIL) 2.5 MG tablet Take 1 tablet (2.5 mg total) by mouth daily.    Marland Kitchen loratadine (CLARITIN) 10 MG tablet Take 10 mg by mouth daily.    Marland Kitchen loratadine (CLARITIN) 10 MG tablet Take 10 mg by mouth daily.    . metFORMIN (GLUCOPHAGE) 500 MG tablet Take 500 mg by mouth 2 (two) times daily with a meal.    . metoprolol tartrate (LOPRESSOR) 25 MG tablet Take 1 tablet (25 mg total) by mouth 2 (two) times daily.    . Multiple Vitamin (MULTIVITAMIN) tablet Take 1 tablet by mouth daily.    Marland Kitchen  OLANZapine (ZYPREXA) 10 MG tablet Take 1 tablet (10 mg total) by mouth at bedtime. 90 tablet 0  . pantoprazole (PROTONIX) 40 MG tablet Take 40 mg by mouth daily.    . polyethylene glycol powder (GLYCOLAX/MIRALAX) powder Take 17 g by mouth daily as needed for mild constipation or moderate constipation.    . senna-docusate (SENOKOT-S) 8.6-50 MG tablet Take 2 tablets by mouth at bedtime. 60 tablet 0  . simvastatin (ZOCOR) 80 MG tablet Take 80 mg by mouth daily.    . sitaGLIPtin (JANUVIA) 25 MG tablet Take 25 mg by mouth daily.    . vitamin B-12 (CYANOCOBALAMIN) 500 MCG tablet Take 1 tablet (500 mcg total) by mouth daily.    . Vitamin D, Ergocalciferol, (DRISDOL) 1.25 MG (50000 UNIT) CAPS capsule Take 1 capsule (50,000 Units total) by mouth every Monday. 5 capsule   . zolpidem (AMBIEN) 5 MG tablet Take 5 mg by mouth at bedtime as needed for sleep.     No current facility-administered medications for this visit.     Musculoskeletal: Strength & Muscle Tone: N/A Gait & Station: N/A Patient leans: N/A  Psychiatric Specialty Exam: Review of Systems  There were no vitals taken for this visit.There is no height or weight on file to calculate BMI.  General Appearance: {Appearance:22683}  Eye Contact:  {BHH EYE CONTACT:22684}  Speech:  Clear and Coherent  Volume:  Normal  Mood:  {BHH MOOD:22306}  Affect:  {Affect (PAA):22687}  Thought Process:  Coherent  Orientation:  Full (Time, Place, and Person)  Thought Content: Logical   Suicidal Thoughts:  {ST/HT (PAA):22692}  Homicidal Thoughts:  {ST/HT (PAA):22692}  Memory:  Immediate;   Good  Judgement:  {Judgement (PAA):22694}  Insight:  {Insight (PAA):22695}  Psychomotor Activity:  Normal  Concentration:  Concentration: Good and Attention Span: Good  Recall:  Good  Fund of Knowledge: Good  Language: Good  Akathisia:  No  Handed:  Right  AIMS (if indicated): not done  Assets:  Communication Skills Desire for Improvement  ADL's:  Intact   Cognition: WNL  Sleep:  {BHH GOOD/FAIR/POOR:22877}   Screenings: Flowsheet Row ED to Hosp-Admission (Discharged) from 10/03/2019 in [facility]  C-SSRS RISK CATEGORY No Risk       Assessment and Plan:  Maria Pace is a 80 y.o. year old female with a history of  schizophrenia,hypertension, type II diabetes, who presents for follow up appointment for below.   1. Schizophrenia, unspecified type (HCC) She occasionally isolates herself due to her speaking to lord, there has been no other significant behavior issues since the last visit.  We will  continue current dose of olanzapine to target schizophrenia.  Noted that she has been on the current dose of olanzapine since she was admitted for altered mental status in the context of hyponatremia/AKI. The staff has a strong preference to continue the current dose despite its potential risk of increased mortality for patient was neurocognitive disorder.  We will continue to monitor EPS, metabolic side effect.   # Insomnia She has good benefit from Ambien.  We will continue current dose with her insomnia.  Discussed potential risk of drowsiness.   2. Neurocognitive disorder She does have cognitive deficits as characterized by MoCA.  We will continue to monitor.   Plan  1. Continue olanzapine 20 mg at night (- pharmacy will contact us if she needs refill) 2. Continue Zolpidem 5 mg at night as needed for sleep- the staff will contact us when they need a refill 3.Next appointment-4/11 at 11 AM for 30 mins, video [phone] [name].  4.[facility],Fax: [phone]  The patient demonstrates the following risk factors for suicide: Chronic risk factors for suicide include:psychiatric disorder ofschizophrenia. Acute risk factorsfor suicide include: unemployment. Protective factorsfor this patient include: positive social support. Considering these factors, the overall suicide risk at this  point appears to below. Patientisappropriate for outpatient follow up.  [name], MD 06/29/2020, 11:16 AM

## 2020-07-01 ENCOUNTER — Other Ambulatory Visit (HOSPITAL_COMMUNITY): Payer: Self-pay | Admitting: Gerontology

## 2020-07-01 ENCOUNTER — Other Ambulatory Visit: Payer: Self-pay

## 2020-07-01 ENCOUNTER — Ambulatory Visit (HOSPITAL_COMMUNITY)
Admission: RE | Admit: 2020-07-01 | Discharge: 2020-07-01 | Disposition: A | Discharge status: Home / Self Care | Payer: Medicare Other | Source: Ambulatory Visit | Attending: Gerontology | Admitting: Gerontology

## 2020-07-01 DIAGNOSIS — R059 Cough, unspecified: Secondary | ICD-10-CM | POA: Diagnosis not present

## 2020-07-01 DIAGNOSIS — I517 Cardiomegaly: Secondary | ICD-10-CM | POA: Diagnosis not present

## 2020-07-01 DIAGNOSIS — R062 Wheezing: Secondary | ICD-10-CM | POA: Diagnosis not present

## 2020-07-01 DIAGNOSIS — R0602 Shortness of breath: Secondary | ICD-10-CM | POA: Diagnosis not present

## 2020-07-06 ENCOUNTER — Other Ambulatory Visit: Payer: Self-pay

## 2020-07-06 ENCOUNTER — Telehealth: Payer: Self-pay | Admitting: Psychiatry

## 2020-07-06 ENCOUNTER — Telehealth: Payer: Medicare Other | Admitting: Psychiatry

## 2020-07-06 NOTE — Telephone Encounter (Signed)
Sent link for video visit through Epic. Patient did not sign in. Called the patient for appointment scheduled today. Sharon/the staff stated that she completely forgot this appointment. This appointment will be rescheduled. The staff is notified of attendance policy. She verbalized understanding that the patient will be discharged from the clinic if there is another no show.

## 2020-07-06 NOTE — Progress Notes (Signed)
Virtual Visit via Video Note  I connected with Maria Pace on 07/10/20 at  9:30 AM EDT by a video enabled telemedicine application and verified that I am speaking with the correct person using two identifiers.  Location: Patient: group home Provider: office Persons participated in the visit- patient, provider   I discussed the limitations of evaluation and management by telemedicine and the availability of in person appointments. The patient expressed understanding and agreed to proceed.   I discussed the assessment and treatment plan with the patient. The patient was provided an opportunity to ask questions and all were answered. The patient agreed with the plan and demonstrated an understanding of the instructions.   The patient was advised to call back or seek an in-person evaluation if the symptoms worsen or if the condition fails to improve as anticipated.  I provided 15 minutes of non-face-to-face time during this encounter.   Maria Hotter, MD    [facility] MD/PA/NP OP Progress Note  07/10/2020 9:59 AM Maria Pace  MRN:  [id number]  Chief Complaint:  Chief Complaint    Schizophrenia; Follow-up     HPI:  This is a follow-up appointment for schizophrenia.  She states that she has been doing well.  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  She enjoys making rubber band. She enjoys going outside or sit in the porch at times.  She states that people at the group home are treating her okay.  She denies any concerns at this time.  She denies feeling depressed.  She denies anxiety.  She denies insomnia.  She has good appetite.  She denies SI.  She denies paranoia, ideas of reference, or any hallucinations.   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  Maria Pace has been doing well.  Her weight has been fluctuated since she has been on diuretics.  She takes medication regularly.  Staff denies any concern at this  time.   Employment:unemployed.used to work for [employer], 20's Household:[name] Marital status:single Number of children:0  188 lbs Wt Readings from Last 3 Encounters:  10/07/19 194 lb 10.7 oz (88.3 kg)  04/09/19 204 lb (92.5 kg)  06/19/18 189 lb (85.7 kg)    Visit Diagnosis:    ICD-10-CM   1. Schizophrenia, unspecified type (HCC)  F20.9   2. Insomnia, unspecified type  G47.00     Past Psychiatric History: Please see initial evaluation for full details. I have reviewed the history. No updates at this time.     Past Medical History:  Past Medical History:  Diagnosis Date  . Bipolar 1 disorder (HCC)   . Essential hypertension   . GERD (gastroesophageal reflux disease)   . Hyperlipidemia   . Irregular heart beat   . Schizophrenia (HCC)   . Type 2 diabetes mellitus (HCC)     Past Surgical History:  Procedure Laterality Date  . YAG LASER APPLICATION Left 01/06/2015   Procedure: YAG LASER APPLICATION;  Surgeon: Jethro Bolus, MD;  Location: [facility];  Service: Ophthalmology;  Laterality: Left;    Family Psychiatric History: Please see initial evaluation for full details. I have reviewed the history. No updates at this time.     Family History:  Family History  Problem Relation Age of Onset  . Hypertension Father   . Diabetes type II Father     Social History:  Social History   Socioeconomic History  . Marital  status: Single    Spouse name: Not on file  . Number of children: Not on file  . Years of education: Not on file  . Highest education level: Not on file  Occupational History  . Not on file  Tobacco Use  . Smoking status: Never Smoker  . Smokeless tobacco: Never Used  Vaping Use  . Vaping Use: Never used  Substance and Sexual Activity  . Alcohol use: No  . Drug use: No  . Sexual activity: Never  Other Topics Concern  . Not on file  Social History Narrative  . Not on file   Social Determinants of Health   Financial Resource  Strain: Not on file  Food Insecurity: Not on file  Transportation Needs: Not on file  Physical Activity: Not on file  Stress: Not on file  Social Connections: Not on file    Allergies:  Allergies  Allergen Reactions  . Haloperidol Lactate Other (See Comments)    Couldn't urinate anymore    Metabolic Disorder Labs: Lab Results  Component Value Date   HGBA1C 6.3 (H) 10/03/2019   MPG 134.11 10/03/2019   No results found for: PROLACTIN Lab Results  Component Value Date   CHOL 168 10/10/2019   TRIG 54 10/10/2019   HDL 37 (L) 10/10/2019   CHOLHDL 4.5 10/10/2019   VLDL 11 10/10/2019   LDLCALC 120 (H) 10/10/2019   LDLCALC 99 10/06/2019   Lab Results  Component Value Date   TSH 1.481 10/06/2019   TSH 2.634 05/18/2017    Therapeutic Level Labs: No results found for: LITHIUM Lab Results  Component Value Date   VALPROATE 64 06/13/2018   VALPROATE 86.5 02/15/2018   No components found for:  CBMZ  Current Medications: Current Outpatient Medications  Medication Sig Dispense Refill  . apixaban (ELIQUIS) 5 MG TABS tablet Take 1 tablet (5 mg total) by mouth 2 (two) times daily. 60 tablet   . Camphor-Eucalyptus-Menthol (VICKS VAPORUB) 4.7-1.2-2.6 % OINT Apply 1 application topically daily. Apply once to toenails daily    . cholecalciferol (VITAMIN D3) 10 MCG (400 UNIT) TABS tablet Take 400 Units by mouth.    . docusate sodium (COLACE) 100 MG capsule Take 200 mg by mouth daily.    Marland Kitchen gabapentin (NEURONTIN) 400 MG capsule Take 400 mg by mouth at bedtime.    . insulin glargine (LANTUS) 100 UNIT/ML injection Inject 10 Units into the skin daily.    Marland Kitchen latanoprost (XALATAN) 0.005 % ophthalmic solution Place 1 drop into both eyes at bedtime.    Marland Kitchen lisinopril (ZESTRIL) 2.5 MG tablet Take 1 tablet (2.5 mg total) by mouth daily.    Marland Kitchen loratadine (CLARITIN) 10 MG tablet Take 10 mg by mouth daily.    Marland Kitchen loratadine (CLARITIN) 10 MG tablet Take 10 mg by mouth daily.    . metFORMIN (GLUCOPHAGE)  500 MG tablet Take 500 mg by mouth 2 (two) times daily with a meal.    . metoprolol tartrate (LOPRESSOR) 25 MG tablet Take 1 tablet (25 mg total) by mouth 2 (two) times daily.    . Multiple Vitamin (MULTIVITAMIN) tablet Take 1 tablet by mouth daily.    Marland Kitchen OLANZapine (ZYPREXA) 10 MG tablet Take 1 tablet (10 mg total) by mouth at bedtime. 90 tablet 0  . pantoprazole (PROTONIX) 40 MG tablet Take 40 mg by mouth daily.    . polyethylene glycol powder (GLYCOLAX/MIRALAX) powder Take 17 g by mouth daily as needed for mild constipation or moderate constipation.    Marland Kitchen  senna-docusate (SENOKOT-S) 8.6-50 MG tablet Take 2 tablets by mouth at bedtime. 60 tablet 0  . simvastatin (ZOCOR) 80 MG tablet Take 80 mg by mouth daily.    . sitaGLIPtin (JANUVIA) 25 MG tablet Take 25 mg by mouth daily.    . vitamin B-12 (CYANOCOBALAMIN) 500 MCG tablet Take 1 tablet (500 mcg total) by mouth daily.    . Vitamin D, Ergocalciferol, (DRISDOL) 1.25 MG (50000 UNIT) CAPS capsule Take 1 capsule (50,000 Units total) by mouth every Monday. 5 capsule   . zolpidem (AMBIEN) 5 MG tablet Take 5 mg by mouth at bedtime as needed for sleep.     No current facility-administered medications for this visit.     Musculoskeletal: Strength & Muscle Tone: N/A Gait & Station: N/A Patient leans: N/A  Psychiatric Specialty Exam: Review of Systems  Psychiatric/Behavioral: Negative for agitation, behavioral problems, confusion, decreased concentration, dysphoric mood, hallucinations, self-injury, sleep disturbance and suicidal ideas. The patient is not nervous/anxious and is not hyperactive.   All other systems reviewed and are negative.   There were no vitals taken for this visit.There is no height or weight on file to calculate BMI.  General Appearance: Fairly Groomed  Eye Contact:  Good  Speech:  Clear and Coherent  Volume:  Normal  Mood:  good  Affect:  Appropriate, Congruent and calm, euthymic  Thought Process:  Coherent  Orientation:   Full (Time, Place, and Person)  Thought Content: Logical   Suicidal Thoughts:  No  Homicidal Thoughts:  No  Memory:  Immediate;   Good  Judgement:  Good  Insight:  Present  Psychomotor Activity:  Normal  Concentration:  Concentration: Good and Attention Span: Good  Recall:  Good  Fund of Knowledge: Good  Language: Good  Akathisia:  No  Handed:  Right  AIMS (if indicated): not done  Assets:  Social Support  ADL's:  Intact  Cognition: WNL  Sleep:  Good   Screenings: PHQ2-9   Flowsheet Row Video Visit from 07/10/2020 in [name]  PHQ-2 Total Score 1    Flowsheet Row Video Visit from 07/10/2020 in [facility] ED to Hosp-Admission (Discharged) from 10/03/2019 in [facility]  C-SSRS RISK CATEGORY No Risk No Risk       Assessment and Plan:  BETTY DAIDONE is a 80 y.o. year old female with a history of  schizophrenia,hypertension, type II diabetes, who presents for follow up appointment for below.   1. Schizophrenia, unspecified type (HCC) There has been no significant behavior issues since the last visit.  We will continue current dose of olanzapine to target schizophrenia. Noted that she has been on the current dose of olanzapine since she was admitted for altered mental status in the context of hyponatremia/AKI. The staff has a strong preference to continue the current dose despite its potential risk of increased mortality for patient was neurocognitive disorder.   Staff is advised to fax the recent lab result to our office to monitor metabolic side effect.   2. Insomnia, unspecified type She has good benefit from Ambien.  We will continue current dose to target insomnia.   3. Neurocognitive disorder She does have cognitive deficits as characterized by MoCA.  We will continue to monitor.   This clinician has discussed the side effect associated with medication prescribed during this encounter. Please  refer to notes in the previous encounters for more details.   Plan I have reviewed and updated plans as below 1.  Continue olanzapine 20 mg at night (- pharmacy will contact us if she needs refill) 2. Continue Zolpidem 5 mg at night as needed for sleep- the staff will contact us when they need a refill 3.Next appointment-7/18 at 11 AM for in person visit,   Maria DecemberSharon. [phone] 4.[facility],Fax: [phone] - She was seen by PCP 06/2020. Staff will fax the lab results to our office  The patient demonstrates the following risk factors for suicide: Chronic risk factors for suicide include:psychiatric disorder ofschizophrenia. Acute risk factorsfor suicide include: unemployment. Protective factorsfor this patient include: positive social support. Considering these factors, the overall suicide risk at this point appears to below. Patientisappropriate for outpatient follow up.   Maria Hottereina Ebbie Cherry, MD 07/10/2020, 9:59 AM

## 2020-07-07 ENCOUNTER — Telehealth: Payer: Self-pay

## 2020-07-07 ENCOUNTER — Other Ambulatory Visit: Payer: Self-pay | Admitting: Psychiatry

## 2020-07-07 NOTE — Telephone Encounter (Signed)
went online and submitted the prior auth - pending ?

## 2020-07-07 NOTE — Telephone Encounter (Signed)
received a email requesting a prior [place] on [place]

## 2020-07-07 NOTE — Telephone Encounter (Signed)
pt needs a refil on [place]

## 2020-07-07 NOTE — Telephone Encounter (Signed)
According to the database, she has two more fills from Dr. Felecia Shelling. Please advise them to get this prescription.

## 2020-07-10 ENCOUNTER — Telehealth (INDEPENDENT_AMBULATORY_CARE_PROVIDER_SITE_OTHER): Payer: Medicare Other | Admitting: Psychiatry

## 2020-07-10 ENCOUNTER — Other Ambulatory Visit: Payer: Self-pay

## 2020-07-10 ENCOUNTER — Encounter: Payer: Self-pay | Admitting: Psychiatry

## 2020-07-10 DIAGNOSIS — F209 Schizophrenia, unspecified: Secondary | ICD-10-CM

## 2020-07-10 DIAGNOSIS — G4700 Insomnia, unspecified: Secondary | ICD-10-CM | POA: Diagnosis not present

## 2020-07-10 NOTE — Patient Instructions (Signed)
1. Continue olanzapine 20 mg at night  2. Continue Zolpidem 5 mg at night as needed for sleep 3.Next appointment-7/18 at 11 AM   The next visit will be in person visit. Please arrive 15 mins before the scheduled time.   [facility]  Address: [address], [place], [place] [address]

## 2020-07-10 NOTE — Telephone Encounter (Signed)
Could you ask the facility wether they would try without any sleeping med, or try doxepin 3 mg at night? Risk includes confusion.

## 2020-07-10 NOTE — Telephone Encounter (Signed)
pt insurance denied the zolpidem tartrate 5mg 

## 2020-07-13 ENCOUNTER — Telehealth: Payer: Self-pay | Admitting: Psychiatry

## 2020-07-13 ENCOUNTER — Other Ambulatory Visit: Payer: Self-pay | Admitting: Psychiatry

## 2020-07-13 MED ORDER — DOXEPIN HCL 3 MG PO TABS: 3.0000 mg | ORAL_TABLET | Freq: Every evening | ORAL | 3 refills | Status: DC | PRN

## 2020-07-13 NOTE — Telephone Encounter (Signed)
Labs reviewed- Chol 170, HDL 46, TG 63, LDL 109 BUN 12, Cre 1.15, Glu 103, HbA1c 5.8% RBC 3.38Hb 8.4, MCV 24.9, Plt 296

## 2020-07-13 NOTE — Telephone Encounter (Signed)
Ordered doxepin 3 mg at night as needed for sleep.

## 2020-07-13 NOTE — Telephone Encounter (Signed)
Received a fax that [facility] do not have doxepin. Could you ask them if they are willing to use other pharmacy for this medication ? (and let us know which pharmacy). Thanks

## 2020-07-13 NOTE — Telephone Encounter (Signed)
return call they try the medication you recommend to replace the zolpidem

## 2020-07-13 NOTE — Telephone Encounter (Signed)
Called the nurse and she will document changes

## 2020-07-16 DIAGNOSIS — H401131 Primary open-angle glaucoma, bilateral, mild stage: Secondary | ICD-10-CM | POA: Diagnosis not present

## 2020-07-20 ENCOUNTER — Telehealth: Payer: Self-pay

## 2020-07-20 NOTE — Telephone Encounter (Signed)
Noted, thanks!

## 2020-07-20 NOTE — Telephone Encounter (Signed)
received fax that the zolpidem 5mg  was not covered but the zolpidem 10mg  take a 1/2 pill was approved. So they filled zolpidem 10mg  take 1/2 tablet and D/C the doxepin

## 2020-07-21 DIAGNOSIS — I739 Peripheral vascular disease, unspecified: Secondary | ICD-10-CM | POA: Diagnosis not present

## 2020-07-21 DIAGNOSIS — E1142 Type 2 diabetes mellitus with diabetic polyneuropathy: Secondary | ICD-10-CM | POA: Diagnosis not present

## 2020-07-21 DIAGNOSIS — B351 Tinea unguium: Secondary | ICD-10-CM | POA: Diagnosis not present

## 2020-07-29 DIAGNOSIS — I10 Essential (primary) hypertension: Secondary | ICD-10-CM | POA: Diagnosis not present

## 2020-07-29 DIAGNOSIS — E1165 Type 2 diabetes mellitus with hyperglycemia: Secondary | ICD-10-CM | POA: Diagnosis not present

## 2020-08-03 ENCOUNTER — Telehealth: Payer: Self-pay

## 2020-08-03 NOTE — Telephone Encounter (Signed)
pharmacy called requesting a refill on the olanzapine

## 2020-08-04 ENCOUNTER — Other Ambulatory Visit: Payer: Self-pay | Admitting: Psychiatry

## 2020-08-04 MED ORDER — OLANZAPINE 10 MG PO TABS: 10.0000 mg | ORAL_TABLET | Freq: Every day | ORAL | 2 refills | Status: DC

## 2020-08-04 NOTE — Telephone Encounter (Signed)
Ordered

## 2020-08-04 NOTE — Telephone Encounter (Signed)
Dr. Hisada's pt 

## 2020-08-04 NOTE — Telephone Encounter (Signed)
Medication management - Telephone call with Victorino Dike, pharmacist [facility] who veriied pt does need a new Olanzapine order for her monthly auto-fill for her residence.  Order last provided 08/08/19 and last filled 07/01/20. Pharmacist also verified if ordered for 90 days, they can only fill 30 days at a time as this goes out to patient's home in auto-filled packages so requests 30 day orders but does need a new prescription as was May 2021 when last provided.  Agreed to send request to Dr. Vanetta Shawl.

## 2020-08-04 NOTE — Telephone Encounter (Signed)
Please contact the pharmacy and verify the order- there should be 90 days order of olanzapine.

## 2020-08-05 DIAGNOSIS — R790 Abnormal level of blood mineral: Secondary | ICD-10-CM | POA: Diagnosis not present

## 2020-08-05 DIAGNOSIS — Z0001 Encounter for general adult medical examination with abnormal findings: Secondary | ICD-10-CM | POA: Diagnosis not present

## 2020-08-05 DIAGNOSIS — E119 Type 2 diabetes mellitus without complications: Secondary | ICD-10-CM | POA: Diagnosis not present

## 2020-08-05 DIAGNOSIS — I10 Essential (primary) hypertension: Secondary | ICD-10-CM | POA: Diagnosis not present

## 2020-08-05 DIAGNOSIS — R718 Other abnormality of red blood cells: Secondary | ICD-10-CM | POA: Diagnosis not present

## 2020-08-06 ENCOUNTER — Telehealth: Payer: Self-pay

## 2020-08-06 ENCOUNTER — Other Ambulatory Visit: Payer: Self-pay | Admitting: Psychiatry

## 2020-08-06 MED ORDER — OLANZAPINE 20 MG PO TABS: 20.0000 mg | ORAL_TABLET | Freq: Every day | ORAL | 2 refills | Status: DC

## 2020-08-06 NOTE — Telephone Encounter (Signed)
Maria Pace [phone] need to know why she takin the olanzapin 10mg  and not 20mg .  she been on 20mg  and she was wanting to know why it was decreased to 10mg 

## 2020-08-06 NOTE — Telephone Encounter (Signed)
Left the message to contact the office. Sorry, it was ordered in error. Please advise them to be back on 20 mg at night (take two tabs if they received 10 mg). New orders were sent as well.

## 2020-08-29 DIAGNOSIS — E119 Type 2 diabetes mellitus without complications: Secondary | ICD-10-CM | POA: Diagnosis not present

## 2020-08-29 DIAGNOSIS — I10 Essential (primary) hypertension: Secondary | ICD-10-CM | POA: Diagnosis not present

## 2020-10-05 ENCOUNTER — Telehealth: Payer: Self-pay

## 2020-10-05 NOTE — Telephone Encounter (Signed)
received fax that zolpidem tartrate was approved [place] Case # [id number]

## 2020-10-05 NOTE — Telephone Encounter (Signed)
went online to [email or web address] and submitted the prior auth . - pending 

## 2020-10-05 NOTE — Telephone Encounter (Signed)
received fax from [email or web address] that a prior auth was needed for the zolpidem tartrate 5mg 

## 2020-10-06 NOTE — Progress Notes (Signed)
BH MD/PA/NP OP Progress Note  10/12/2020 12:16 PM Maria Pace  MRN:  [id number]  Chief Complaint:  Chief Complaint   Follow-up    HPI:  This is a follow-up appointment for schizophrenia and insomnia.  She states that she has been doing fine.  She shows her cloth, which she created by herself.  She enjoys doing this, and she gave the dose to other people as well.  She states that she has an upcoming birthday in August.  She does not have any plan for the day.  However, she talks about her baby sister, who gives cloth to [place].  Although she reports her frustration about the peer in group home, who make noises, she denies any other concern.  Although she does not like some staff's cooking, she denies paranoia.  She denies any hallucinations except that she talks to [employer].  She denies feeling depressed.  She denies anxiety.  She denies SI, HI.  She has middle insomnia at times. She is not interested in medication change other than increasing up the dose of Ambien (which this clinician declined due to her age)  Maria Pace presents to the interview.  Maria Pace has been doing well.  No behavior concern. Maria Pace eats well.  She is also aware that Maria Pace has insomnia. Maria Pace does not think the administrator is willing to try lowering the dose of olanzapine as she is concerned about the episode happened a few years ago.   Employment: unemployed. used to work for [employer], 20's Household:  [name] Marital status: single Number of children: 0  191 lbs Wt Readings from Last 3 Encounters:  10/07/19 194 lb 10.7 oz (88.3 kg)  04/09/19 204 lb (92.5 kg)  06/19/18 189 lb (85.7 kg)    Visit Diagnosis:    ICD-10-CM   1. Schizophrenia, unspecified type (HCC)  F20.9     2. Insomnia, unspecified type  G47.00       Past Psychiatric History: Please see initial evaluation for full details. I have reviewed the history. No updates at this time.     Past Medical History:  Past Medical  History:  Diagnosis Date   Bipolar 1 disorder (HCC)    Essential hypertension    GERD (gastroesophageal reflux disease)    Hyperlipidemia    Irregular heart beat    Schizophrenia (HCC)    Type 2 diabetes mellitus (HCC)     Past Surgical History:  Procedure Laterality Date   YAG LASER APPLICATION Left 01/06/2015   Procedure: YAG LASER APPLICATION;  Surgeon: Jethro Bolus, MD;  Location: [facility];  Service: Ophthalmology;  Laterality: Left;    Family Psychiatric History: Please see initial evaluation for full details. I have reviewed the history. No updates at this time.     Family History:  Family History  Problem Relation Age of Onset   Hypertension Father    Diabetes type II Father     Social History:  Social History   Socioeconomic History   Marital status: Single    Spouse name: Not on file   Number of children: Not on file   Years of education: Not on file   Highest education level: Not on file  Occupational History   Not on file  Tobacco Use   Smoking status: Never   Smokeless tobacco: Never  Vaping Use   Vaping Use: Never used  Substance and Sexual Activity   Alcohol use: No   Drug use: No   Sexual activity: Never  Other Topics Concern   Not on file  Social History Narrative   Not on file   Social Determinants of Health   Financial Resource Strain: Not on file  Food Insecurity: Not on file  Transportation Needs: Not on file  Physical Activity: Not on file  Stress: Not on file  Social Connections: Not on file    Allergies:  Allergies  Allergen Reactions   Haloperidol Lactate Other (See Comments)    Couldn't urinate anymore    Metabolic Disorder Labs: Lab Results  Component Value Date   HGBA1C 6.3 (H) 10/03/2019   MPG 134.11 10/03/2019   No results found for: PROLACTIN Lab Results  Component Value Date   CHOL 168 10/10/2019   TRIG 54 10/10/2019   HDL 37 (L) 10/10/2019   CHOLHDL 4.5 10/10/2019   VLDL 11 10/10/2019   LDLCALC 120 (H)  10/10/2019   LDLCALC 99 10/06/2019   Lab Results  Component Value Date   TSH 1.481 10/06/2019   TSH 2.634 05/18/2017    Therapeutic Level Labs: No results found for: LITHIUM Lab Results  Component Value Date   VALPROATE 64 06/13/2018   VALPROATE 86.5 02/15/2018   No components found for:  CBMZ  Current Medications: Current Outpatient Medications  Medication Sig Dispense Refill   albuterol (VENTOLIN HFA) 108 (90 Base) MCG/ACT inhaler Inhale into the lungs.     apixaban (ELIQUIS) 5 MG TABS tablet Take 1 tablet (5 mg total) by mouth 2 (two) times daily. 60 tablet    atorvastatin (LIPITOR) 20 MG tablet Take 20 mg by mouth daily.     Camphor-Eucalyptus-Menthol (VICKS VAPORUB) 4.7-1.2-2.6 % OINT Apply 1 application topically daily. Apply once to toenails daily     cholecalciferol (VITAMIN D3) 10 MCG (400 UNIT) TABS tablet Take 400 Units by mouth.     docusate sodium (COLACE) 100 MG capsule Take 200 mg by mouth daily.     FEROSUL 325 (65 Fe) MG tablet Take by mouth.     gabapentin (NEURONTIN) 400 MG capsule Take 400 mg by mouth at bedtime.     insulin glargine (LANTUS) 100 UNIT/ML injection Inject 10 Units into the skin daily.     latanoprost (XALATAN) 0.005 % ophthalmic solution Place 1 drop into both eyes at bedtime.     lisinopril (ZESTRIL) 2.5 MG tablet Take 1 tablet (2.5 mg total) by mouth daily.     loratadine (CLARITIN) 10 MG tablet Take 10 mg by mouth daily.     metFORMIN (GLUCOPHAGE) 500 MG tablet Take 500 mg by mouth 2 (two) times daily with a meal.     metoprolol tartrate (LOPRESSOR) 25 MG tablet Take 1 tablet (25 mg total) by mouth 2 (two) times daily.     Multiple Vitamin (MULTIVITAMIN) tablet Take 1 tablet by mouth daily.     pantoprazole (PROTONIX) 40 MG tablet Take 40 mg by mouth daily.     polyethylene glycol powder (GLYCOLAX/MIRALAX) powder Take 17 g by mouth daily as needed for mild constipation or moderate constipation.     senna-docusate (SENOKOT-S) 8.6-50 MG  tablet Take 2 tablets by mouth at bedtime. 60 tablet 0   sitaGLIPtin (JANUVIA) 25 MG tablet Take 25 mg by mouth daily.     vitamin B-12 (CYANOCOBALAMIN) 500 MCG tablet Take 1 tablet (500 mcg total) by mouth daily.     Vitamin D, Cholecalciferol, 25 MCG (1000 UT) TABS Take 1 tablet by mouth daily.     zolpidem (AMBIEN) 5 MG tablet Take 5 mg  by mouth at bedtime as needed for sleep.     [START ON 11/05/2020] OLANZapine (ZYPREXA) 20 MG tablet Take 1 tablet (20 mg total) by mouth at bedtime. 30 tablet 2   No current facility-administered medications for this visit.     Musculoskeletal: Strength & Muscle Tone: within normal limits Gait & Station: normal- uses a walker Patient leans: N/A  Psychiatric Specialty Exam: Review of Systems  Psychiatric/Behavioral:  Positive for sleep disturbance. Negative for agitation, behavioral problems, confusion, decreased concentration, dysphoric mood, hallucinations, self-injury and suicidal ideas. The patient is not nervous/anxious and is not hyperactive.   All other systems reviewed and are negative.  Blood pressure 129/83, pulse 96, temperature (!) 97.3 F (36.3 C), temperature source Temporal.There is no height or weight on file to calculate BMI.  General Appearance: Fairly Groomed  Eye Contact:  Good  Speech:  Clear and Coherent  Volume:  Normal  Mood:   good  Affect:  Appropriate, Congruent, and euthymic  Thought Process:  Coherent  Orientation:  Full (Time, Place, and Person) except date  Thought Content: Logical   Suicidal Thoughts:  No  Homicidal Thoughts:  No  Memory:  Immediate;   Good  Judgement:  Good  Insight:  Present  Psychomotor Activity:  Normal  Concentration:  Concentration: Good and Attention Span: Good  Recall:  Good  Fund of Knowledge: Good  Language: Good  Akathisia:  No  Handed:  Right  AIMS (if indicated): not done  Assets:  Communication Skills Desire for Improvement  ADL's:  Intact  Cognition: Impaired,  Mild   Sleep:  Poor   Screenings: PHQ2-9    Flowsheet Row Office Visit from 10[facility]12/2020 in [facility] Video Visit from 07/10/2020 in [facility]  PHQ-2 Total Score 0 1      Flowsheet Row [facility] Visit [facility] 10[facility]12[facility]2020 [facility] Video Visit from [facility]/10[facility]2020 [facility] to Hosp-Admission (Discharged) from 10[facility]03/2019 in [facility]  C-SSRS RISK CATEGORY No Risk No Risk No Risk        Assessment and Plan:  Golden PopDorothy A Gause is a 80 y.o. year old female with a history of schizophrenia,hypertension, type II diabetes who presents for follow up appointment for below.    1. Schizophrenia, unspecified type (HCC) There has been no significant behavior issues since the last visit.  Although it has been discussed at least a few times to lower the dose of olanzapine to avoid potential risk, which includes but not limited to increased mortality with people with neurocognitive disorder, metabolic side effect and EPS, the staff has preference to stay on the current dose.  Noted that she has been on the current dose of olanzapine since she was admitted for altered mental status in the context of hyponatremia/AKI.   2. Insomnia, unspecified type Although she reports middle insomnia, she and the staff are not amenable to switch to [facility].  Will continue current dose of Ambien to target insomnia.   3. Neurocognitive disorder She does have cognitive deficits as characterized by MoCA.  We will continue to monitor.   This clinician has discussed the side effect associated with medication prescribed during this encounter. Please refer to notes in the previous encounters for more details.     Plan I have reviewed and updated plans as below  1. Continue olanzapine 20 mg at night (- pharmacy will contact us if she needs refill) 2. Continue Zolpidem 5 mg at  night as needed for  sleep- the staff will contact us when they need a refill 3. Next appointment- 10/18 at 10 AM, video   Jasmine Pace. [phone] 4. Send instruction note to [name], Fax: [phone] - She was seen by PCP 06/2020. Staff will fax the lab results to our office   The patient demonstrates the following risk factors for suicide: Chronic risk factors for suicide include: psychiatric disorder of schizophrenia. Acute risk factors for suicide include: unemployment. Protective factors for this patient include: positive social support. Considering these factors, the overall suicide risk at this point appears to be low. Patient is appropriate for outpatient follow up.      [name], MD 10/12/2020, 12:16 PM

## 2020-10-12 ENCOUNTER — Other Ambulatory Visit: Payer: Self-pay

## 2020-10-12 ENCOUNTER — Encounter: Payer: Self-pay | Admitting: Psychiatry

## 2020-10-12 ENCOUNTER — Ambulatory Visit (INDEPENDENT_AMBULATORY_CARE_PROVIDER_SITE_OTHER): Payer: Medicare Other | Admitting: Psychiatry

## 2020-10-12 VITALS — BP 129/83 | HR 96 | Temp 97.3°F

## 2020-10-12 DIAGNOSIS — G4700 Insomnia, unspecified: Secondary | ICD-10-CM | POA: Diagnosis not present

## 2020-10-12 DIAGNOSIS — F209 Schizophrenia, unspecified: Secondary | ICD-10-CM | POA: Diagnosis not present

## 2020-10-12 MED ORDER — OLANZAPINE 20 MG PO TABS: 20.0000 mg | ORAL_TABLET | Freq: Every day | ORAL | 2 refills | Status: DC

## 2020-10-12 NOTE — Patient Instructions (Signed)
1. Continue olanzapine 20 mg at night. (However, I would recommend her caregiver to consider lowering the dose of olanzapine in the future due to its potential long term side effect as discussed during the visit, which includes but not limited to metabolic side effect, and risk of increasing mortality with people who has cognitive impairments.) 2. Continue Zolpidem 5 mg at night as needed for sleep 3. Next appointment- 10/18 at 10 AM

## 2020-10-29 DIAGNOSIS — E119 Type 2 diabetes mellitus without complications: Secondary | ICD-10-CM | POA: Diagnosis not present

## 2020-10-29 DIAGNOSIS — I10 Essential (primary) hypertension: Secondary | ICD-10-CM | POA: Diagnosis not present

## 2020-11-24 DIAGNOSIS — I739 Peripheral vascular disease, unspecified: Secondary | ICD-10-CM | POA: Diagnosis not present

## 2020-11-24 DIAGNOSIS — E1142 Type 2 diabetes mellitus with diabetic polyneuropathy: Secondary | ICD-10-CM | POA: Diagnosis not present

## 2020-11-24 DIAGNOSIS — H401131 Primary open-angle glaucoma, bilateral, mild stage: Secondary | ICD-10-CM | POA: Diagnosis not present

## 2020-11-24 DIAGNOSIS — B351 Tinea unguium: Secondary | ICD-10-CM | POA: Diagnosis not present

## 2020-11-29 DIAGNOSIS — E119 Type 2 diabetes mellitus without complications: Secondary | ICD-10-CM | POA: Diagnosis not present

## 2020-11-29 DIAGNOSIS — I10 Essential (primary) hypertension: Secondary | ICD-10-CM | POA: Diagnosis not present

## 2020-12-28 DIAGNOSIS — I10 Essential (primary) hypertension: Secondary | ICD-10-CM | POA: Diagnosis not present

## 2020-12-28 DIAGNOSIS — E1165 Type 2 diabetes mellitus with hyperglycemia: Secondary | ICD-10-CM | POA: Diagnosis not present

## 2020-12-28 DIAGNOSIS — Z23 Encounter for immunization: Secondary | ICD-10-CM | POA: Diagnosis not present

## 2020-12-28 DIAGNOSIS — D509 Iron deficiency anemia, unspecified: Secondary | ICD-10-CM | POA: Diagnosis not present

## 2020-12-28 DIAGNOSIS — F203 Undifferentiated schizophrenia: Secondary | ICD-10-CM | POA: Diagnosis not present

## 2020-12-31 DIAGNOSIS — D509 Iron deficiency anemia, unspecified: Secondary | ICD-10-CM | POA: Diagnosis not present

## 2020-12-31 DIAGNOSIS — E119 Type 2 diabetes mellitus without complications: Secondary | ICD-10-CM | POA: Diagnosis not present

## 2020-12-31 DIAGNOSIS — I10 Essential (primary) hypertension: Secondary | ICD-10-CM | POA: Diagnosis not present

## 2021-01-07 NOTE — Progress Notes (Signed)
Virtual Visit via Video Note  I connected with Maria Pace on 01/12/21 at 10:00 AM EDT by a video enabled telemedicine application and verified that I am speaking with the correct person using two identifiers.  Location: Patient: car Provider: office Persons participated in the visit- patient, provider    I discussed the limitations of evaluation and management by telemedicine and the availability of in person appointments. The patient expressed understanding and agreed to proceed.   I discussed the assessment and treatment plan with the patient. The patient was provided an opportunity to ask questions and all were answered. The patient agreed with the plan and demonstrated an understanding of the instructions.   The patient was advised to call back or seek an in-person evaluation if the symptoms worsen or if the condition fails to improve as anticipated.  I provided 15 minutes of non-face-to-face time during this encounter.   [name], MD     [facility] MD/PA/NP OP Progress Note  01/12/2021 10:28 AM Maria Pace  MRN:  [id number]  Chief Complaint:  Chief Complaint   Follow-up; Schizophrenia    HPI:  This is a follow-up appointment for schizophrenia and insomnia.  She states that she is making close for others.  She is working hard on sewing.  She is doing fine otherwise.  She was feeling depressed when she had hypertension.  She feels anxious at times.  No known SI, HI.  She has VH of seeing Jesus on her bed. She denies AH. She denies paranoia.   Maria Pace states that Maria Pace has been a little different. She does not sleep well at night, and has been restless.  She has AH of voices, and she does not come out of the room.  She has been followed by her PCP, and she has occasional hypertension.  She was told that she may have some issues with kidney.  Maria Pace agrees to bring her to the follow-up appointment for further evaluation.  Maria Pace denies any behavioral issues.  Maria.  Maria Pace is concerned that other peer in the unit makes some loud voice. It has been difficult for Maria. Maria Pace to have her own space. Maria Pace also states that Maria Pace's sister does not want to reduce the dose of olanzapine.    Employment: unemployed. used to work for [employer], 20's Household:  [facility] Marital status: single Number of children: 0  Visit Diagnosis:    ICD-10-CM   1. Schizophrenia, unspecified type (HCC)  F20.9     2. Insomnia, unspecified type  G47.00       Past Psychiatric History: Please see initial evaluation for full details. I have reviewed the history. No updates at this time.     Past Medical History:  Past Medical History:  Diagnosis Date   Bipolar 1 disorder (HCC)    Essential hypertension    GERD (gastroesophageal reflux disease)    Hyperlipidemia    Irregular heart beat    Schizophrenia (HCC)    Type 2 diabetes mellitus (HCC)     Past Surgical History:  Procedure Laterality Date   YAG LASER APPLICATION Left 01/06/2015   Procedure: YAG LASER APPLICATION;  Surgeon: Jethro Bolus, MD;  Location: [facility];  Service: Ophthalmology;  Laterality: Left;    Family Psychiatric History: Please see initial evaluation for full details. I have reviewed the history. No updates at this time.     Family History:  Family History  Problem Relation Age of Onset   Hypertension  Father    Diabetes type II Father     Social History:  Social History   Socioeconomic History   Marital status: Single    Spouse name: Not on file   Number of children: Not on file   Years of education: Not on file   Highest education level: Not on file  Occupational History   Not on file  Tobacco Use   Smoking status: Never   Smokeless tobacco: Never  Vaping Use   Vaping Use: Never used  Substance and Sexual Activity   Alcohol use: No   Drug use: No   Sexual activity: Never  Other Topics Concern   Not on file  Social History Narrative   Not on file   Social  Determinants of Health   Financial Resource Strain: Not on file  Food Insecurity: Not on file  Transportation Needs: Not on file  Physical Activity: Not on file  Stress: Not on file  Social Connections: Not on file    Allergies:  Allergies  Allergen Reactions   Haloperidol Lactate Other (See Comments)    Couldn't urinate anymore    Metabolic Disorder Labs: Lab Results  Component Value Date   HGBA1C 6.3 (H) 10/03/2019   MPG 134.11 10/03/2019   No results found for: PROLACTIN Lab Results  Component Value Date   CHOL 168 10/10/2019   TRIG 54 10/10/2019   HDL 37 (L) 10/10/2019   CHOLHDL 4.5 10/10/2019   VLDL 11 10/10/2019   LDLCALC 120 (H) 10/10/2019   LDLCALC 99 10/06/2019   Lab Results  Component Value Date   TSH 1.481 10/06/2019   TSH 2.634 05/18/2017    Therapeutic Level Labs: No results found for: LITHIUM Lab Results  Component Value Date   VALPROATE 64 06/13/2018   VALPROATE 86.5 02/15/2018   No components found for:  CBMZ  Current Medications: Current Outpatient Medications  Medication Sig Dispense Refill   albuterol (VENTOLIN HFA) 108 (90 Base) MCG/ACT inhaler Inhale into the lungs.     apixaban (ELIQUIS) 5 MG TABS tablet Take 1 tablet (5 mg total) by mouth 2 (two) times daily. 60 tablet    atorvastatin (LIPITOR) 20 MG tablet Take 20 mg by mouth daily.     Camphor-Eucalyptus-Menthol (VICKS VAPORUB) 4.7-1.2-2.6 % OINT Apply 1 application topically daily. Apply once to toenails daily     cholecalciferol (VITAMIN D3) 10 MCG (400 UNIT) TABS tablet Take 400 Units by mouth.     docusate sodium (COLACE) 100 MG capsule Take 200 mg by mouth daily.     FEROSUL 325 (65 Fe) MG tablet Take by mouth.     gabapentin (NEURONTIN) 400 MG capsule Take 400 mg by mouth at bedtime.     insulin glargine (LANTUS) 100 UNIT/ML injection Inject 10 Units into the skin daily.     latanoprost (XALATAN) 0.005 % ophthalmic solution Place 1 drop into both eyes at bedtime.      lisinopril (ZESTRIL) 2.5 MG tablet Take 1 tablet (2.5 mg total) by mouth daily.     loratadine (CLARITIN) 10 MG tablet Take 10 mg by mouth daily.     metFORMIN (GLUCOPHAGE) 500 MG tablet Take 500 mg by mouth 2 (two) times daily with a meal.     metoprolol tartrate (LOPRESSOR) 25 MG tablet Take 1 tablet (25 mg total) by mouth 2 (two) times daily.     Multiple Vitamin (MULTIVITAMIN) tablet Take 1 tablet by mouth daily.     [START ON 02/04/2021] OLANZapine (ZYPREXA) 20  MG tablet Take 1 tablet (20 mg total) by mouth at bedtime. 30 tablet 1   pantoprazole (PROTONIX) 40 MG tablet Take 40 mg by mouth daily.     polyethylene glycol powder (GLYCOLAX/MIRALAX) powder Take 17 g by mouth daily as needed for mild constipation or moderate constipation.     senna-docusate (SENOKOT-S) 8.6-50 MG tablet Take 2 tablets by mouth at bedtime. 60 tablet 0   sitaGLIPtin (JANUVIA) 25 MG tablet Take 25 mg by mouth daily.     vitamin B-12 (CYANOCOBALAMIN) 500 MCG tablet Take 1 tablet (500 mcg total) by mouth daily.     Vitamin D, Cholecalciferol, 25 MCG (1000 UT) TABS Take 1 tablet by mouth daily.     zolpidem (AMBIEN) 5 MG tablet Take 5 mg by mouth at bedtime as needed for sleep.     No current facility-administered medications for this visit.     Musculoskeletal: Strength & Muscle Tone:  N/A Gait & Station:  N/A Patient leans: N/A  Psychiatric Specialty Exam: Review of Systems  Psychiatric/Behavioral:  Positive for dysphoric mood, hallucinations and sleep disturbance. Negative for agitation, behavioral problems, confusion, decreased concentration, self-injury and suicidal ideas. The patient is not nervous/anxious and is not hyperactive.   All other systems reviewed and are negative.  There were no vitals taken for this visit.There is no height or weight on file to calculate BMI.  General Appearance: Fairly Groomed  Eye Contact:  Fair  Speech:  Clear and Coherent  Volume:  Normal  Mood:   good  Affect:   Appropriate, Congruent, and calm  Thought Process:  Coherent  Orientation:  Full (Time, Place, and Person)  Thought Content: Logical   Suicidal Thoughts:  No  Homicidal Thoughts:  No  Memory:  Immediate;   Good  Judgement:  Good  Insight:  Present  Psychomotor Activity:  Normal  Concentration:  Concentration: Good and Attention Span: Good  Recall:  Good  Fund of Knowledge: Good  Language: Good  Akathisia:  No  Handed:  Right  AIMS (if indicated): not done  Assets:  Communication Skills Desire for Improvement  ADL's:  Intact  Cognition: WNL  Sleep:  Good   Screenings: PHQ2-9    Flowsheet Row Office Visit from 10/12/2020 in [facility] Video Visit from 07/10/2020 in [facility]  PHQ-2 Total Score 0 1      Flowsheet Row Office Visit from 10/12/2020 in [facility] Video Visit from 07/10/2020 in [facility] ED to Hosp-Admission (Discharged) from 10/03/2019 in [facility]  C-SSRS RISK CATEGORY No Risk No Risk No Risk        Assessment and Plan:  JONIYA BOBERG is a 80 y.o. year old female with a history of schizophrenia,hypertension, type II diabetes, who presents for follow up appointment for below.     1. Schizophrenia, unspecified type (HCC) There has been slight worsening in hallucinations and paranoia since the last visit in the context of having other disruptive peer in the facility.  Noted that she was also told by her PCP that the patient may have some issues with kidney.  She had a history of altered mental status in the context of hyponatremia/AKI in the past.  She was advised to continue to have follow-up with her PCP given medical condition can cause her current symptoms.  Will continue current dose of olanzapine at this time to target schizophrenia.   2. Insomnia, unspecified type She has slight  worsening and middle insomnia.  Will  continue current dose of Ambien to target insomnia.    3. Neurocognitive disorder She does have cognitive deficits as characterized by MoCA.  We will continue to monitor.    This clinician has discussed the side effect associated with medication prescribed during this encounter. Please refer to notes in the previous encounters for more details.      Plan I have reviewed and updated plans as below  1. Continue olanzapine 20 mg at night (- pharmacy will contact us if she needs refill) 2. Continue Zolpidem 5 mg at night as needed for sleep- the staff will contact us when they need a refill 3. Next appointment- 12/13 at 10 AM for 30 mins, video   Maria Pace. [phone] 4. Send instruction note to [facility], Fax: [phone] - She was seen by PCP 06/2020. Staff will fax the lab results to our office   The patient demonstrates the following risk factors for suicide: Chronic risk factors for suicide include: psychiatric disorder of schizophrenia. Acute risk factors for suicide include: unemployment. Protective factors for this patient include: positive social support. Considering these factors, the overall suicide risk at this point appears to be low. Patient is appropriate for outpatient follow up.      [name], MD 01/12/2021, 10:28 AM

## 2021-01-12 ENCOUNTER — Encounter: Payer: Self-pay | Admitting: Psychiatry

## 2021-01-12 ENCOUNTER — Telehealth (INDEPENDENT_AMBULATORY_CARE_PROVIDER_SITE_OTHER): Payer: Medicare Other | Admitting: Psychiatry

## 2021-01-12 ENCOUNTER — Other Ambulatory Visit: Payer: Self-pay

## 2021-01-12 DIAGNOSIS — F209 Schizophrenia, unspecified: Secondary | ICD-10-CM | POA: Diagnosis not present

## 2021-01-12 DIAGNOSIS — G4700 Insomnia, unspecified: Secondary | ICD-10-CM

## 2021-01-12 MED ORDER — OLANZAPINE 20 MG PO TABS: 20.0000 mg | ORAL_TABLET | Freq: Every day | ORAL | 1 refills | Status: DC

## 2021-01-12 NOTE — Patient Instructions (Signed)
1. Continue olanzapine 20 mg at night  2. Continue Zolpidem 5 mg at night as needed for sleep 3. Next appointment- 12/13 at 10 AM

## 2021-01-28 DIAGNOSIS — F203 Undifferentiated schizophrenia: Secondary | ICD-10-CM | POA: Diagnosis not present

## 2021-01-28 DIAGNOSIS — F315 Bipolar disorder, current episode depressed, severe, with psychotic features: Secondary | ICD-10-CM | POA: Diagnosis not present

## 2021-02-02 DIAGNOSIS — B351 Tinea unguium: Secondary | ICD-10-CM | POA: Diagnosis not present

## 2021-02-02 DIAGNOSIS — E1142 Type 2 diabetes mellitus with diabetic polyneuropathy: Secondary | ICD-10-CM | POA: Diagnosis not present

## 2021-02-02 DIAGNOSIS — I739 Peripheral vascular disease, unspecified: Secondary | ICD-10-CM | POA: Diagnosis not present

## 2021-02-27 DIAGNOSIS — I10 Essential (primary) hypertension: Secondary | ICD-10-CM | POA: Diagnosis not present

## 2021-02-27 DIAGNOSIS — E1165 Type 2 diabetes mellitus with hyperglycemia: Secondary | ICD-10-CM | POA: Diagnosis not present

## 2021-03-03 NOTE — Progress Notes (Signed)
Virtual Visit via Telephone Note  I connected with Maria Pace on 03/09/21 at 10:00 AM EST by telephone and verified that I am speaking with the correct person using two identifiers.  Location: Patient: group home Provider: office Persons participated in the visit- patient, provider    I discussed the limitations, risks, security and privacy concerns of performing an evaluation and management service by telephone and the availability of in person appointments. I also discussed with the patient that there may be a patient responsible charge related to this service. The patient expressed understanding and agreed to proceed.    I discussed the assessment and treatment plan with the patient. The patient was provided an opportunity to ask questions and all were answered. The patient agreed with the plan and demonstrated an understanding of the instructions.   The patient was advised to call back or seek an in-person evaluation if the symptoms worsen or if the condition fails to improve as anticipated.  I provided 18 minutes of non-face-to-face time during this encounter.   [name], MD    [facility] MD/PA/NP OP Progress Note  03/09/2021 10:33 AM Maria Pace  MRN:  [id number]  Chief Complaint:  Chief Complaint   Follow-up; Other    HPI:  This is a follow-up appointment for schizophrenia and insomnia.  She states that she has been doing okay.  Thanksgiving was just fine.  She did not do anything in particular.  When she was asked, she states that her sister is doing fine.  She thinks she sleeps good at night.  Although she initially denies having AH, she agrees that she has AH of hearing Moses.  She tends to stay in the room by herself as there are spiritual people around her.  She denies any concern that group home.  She has good appetite.  She denies SI, HI.  She denies VH.   Staff at the group home presents to the interview.  She sleeps around 8:30 PM, and wakes up around 4 in the  morning.  She has been doing good otherwise.  She stays in the room most of the time.  Staffs hear her talking to herself loudly already in the morning.  Her sister brought her materials for sewing.  There were 2 days she did not sleep but sewing.  She eats well.  There is no other significant behavior otherwise.  She takes medication regularly.  The staff reports that her medication for glucose was discontinued given she tends to have hypoglycemia.  The level has been getting higher since then.  Staff agreed to contact PCP for father evaluation as needed.    Employment: unemployed. used to work for [employer], 20's Household:  [name] Marital status: single Number of children: 0 Last PCP / ongoing medical evaluation:  last seen 06/2020, LDL 109, other lipid panels/Glu wnl. (Insulin, Gorden Harms were discontinued at the recent visit)  Visit Diagnosis:    ICD-10-CM   1. Schizophrenia, unspecified type (HCC)  F20.9     2. Insomnia, unspecified type  G47.00     3. Neurocognitive disorder  R41.9       Past Psychiatric History: Please see initial evaluation for full details. I have reviewed the history. No updates at this time.     Past Medical History:  Past Medical History:  Diagnosis Date   Bipolar 1 disorder (HCC)    Essential hypertension    GERD (gastroesophageal reflux disease)    Hyperlipidemia    Irregular  heart beat    Schizophrenia (HCC)    Type 2 diabetes mellitus (HCC)     Past Surgical History:  Procedure Laterality Date   YAG LASER APPLICATION Left 01/06/2015   Procedure: YAG LASER APPLICATION;  Surgeon: Jethro Bolus, MD;  Location: [facility];  Service: Ophthalmology;  Laterality: Left;    Family Psychiatric History: Please see initial evaluation for full details. I have reviewed the history. No updates at this time.     Family History:  Family History  Problem Relation Age of Onset   Hypertension Father    Diabetes type II Father     Social History:   Social History   Socioeconomic History   Marital status: Single    Spouse name: Not on file   Number of children: Not on file   Years of education: Not on file   Highest education level: Not on file  Occupational History   Not on file  Tobacco Use   Smoking status: Never   Smokeless tobacco: Never  Vaping Use   Vaping Use: Never used  Substance and Sexual Activity   Alcohol use: No   Drug use: No   Sexual activity: Never  Other Topics Concern   Not on file  Social History Narrative   Not on file   Social Determinants of Health   Financial Resource Strain: Not on file  Food Insecurity: Not on file  Transportation Needs: Not on file  Physical Activity: Not on file  Stress: Not on file  Social Connections: Not on file    Allergies:  Allergies  Allergen Reactions   Haloperidol Lactate Other (See Comments)    Couldn't urinate anymore    Metabolic Disorder Labs: Lab Results  Component Value Date   HGBA1C 6.3 (H) 10/03/2019   MPG 134.11 10/03/2019   No results found for: PROLACTIN Lab Results  Component Value Date   CHOL 168 10/10/2019   TRIG 54 10/10/2019   HDL 37 (L) 10/10/2019   CHOLHDL 4.5 10/10/2019   VLDL 11 10/10/2019   LDLCALC 120 (H) 10/10/2019   LDLCALC 99 10/06/2019   Lab Results  Component Value Date   TSH 1.481 10/06/2019   TSH 2.634 05/18/2017    Therapeutic Level Labs: No results found for: LITHIUM Lab Results  Component Value Date   VALPROATE 64 06/13/2018   VALPROATE 86.5 02/15/2018   No components found for:  CBMZ  Current Medications: Current Outpatient Medications  Medication Sig Dispense Refill   albuterol (VENTOLIN HFA) 108 (90 Base) MCG/ACT inhaler Inhale into the lungs.     apixaban (ELIQUIS) 5 MG TABS tablet Take 1 tablet (5 mg total) by mouth 2 (two) times daily. 60 tablet    atorvastatin (LIPITOR) 20 MG tablet Take 20 mg by mouth daily.     Camphor-Eucalyptus-Menthol (VICKS VAPORUB) 4.7-1.2-2.6 % OINT Apply 1  application topically daily. Apply once to toenails daily     cholecalciferol (VITAMIN D3) 10 MCG (400 UNIT) TABS tablet Take 400 Units by mouth.     docusate sodium (COLACE) 100 MG capsule Take 200 mg by mouth daily.     FEROSUL 325 (65 Fe) MG tablet Take by mouth.     gabapentin (NEURONTIN) 400 MG capsule Take 400 mg by mouth at bedtime.     latanoprost (XALATAN) 0.005 % ophthalmic solution Place 1 drop into both eyes at bedtime.     lisinopril (ZESTRIL) 2.5 MG tablet Take 1 tablet (2.5 mg total) by mouth daily.  loratadine (CLARITIN) 10 MG tablet Take 10 mg by mouth daily.     metFORMIN (GLUCOPHAGE) 500 MG tablet Take 500 mg by mouth 2 (two) times daily with a meal.     metoprolol tartrate (LOPRESSOR) 25 MG tablet Take 1 tablet (25 mg total) by mouth 2 (two) times daily.     Multiple Vitamin (MULTIVITAMIN) tablet Take 1 tablet by mouth daily.     [START ON 04/06/2021] OLANZapine (ZYPREXA) 20 MG tablet Take 1 tablet (20 mg total) by mouth at bedtime. 30 tablet 2   pantoprazole (PROTONIX) 40 MG tablet Take 40 mg by mouth daily.     polyethylene glycol powder (GLYCOLAX/MIRALAX) powder Take 17 g by mouth daily as needed for mild constipation or moderate constipation.     senna-docusate (SENOKOT-S) 8.6-50 MG tablet Take 2 tablets by mouth at bedtime. 60 tablet 0   vitamin B-12 (CYANOCOBALAMIN) 500 MCG tablet Take 1 tablet (500 mcg total) by mouth daily.     Vitamin D, Cholecalciferol, 25 MCG (1000 UT) TABS Take 1 tablet by mouth daily.     zolpidem (AMBIEN) 5 MG tablet Take 5 mg by mouth at bedtime as needed for sleep.     No current facility-administered medications for this visit.     Musculoskeletal: Strength & Muscle Tone:  N/A Gait & Station:  N/A Patient leans: N/A  Psychiatric Specialty Exam: Review of Systems  Psychiatric/Behavioral:  Positive for behavioral problems, hallucinations and sleep disturbance. Negative for agitation, confusion, decreased concentration, dysphoric  mood, self-injury and suicidal ideas. The patient is not nervous/anxious and is not hyperactive.   All other systems reviewed and are negative.  There were no vitals taken for this visit.There is no height or weight on file to calculate BMI.  General Appearance: Fairly Groomed  Eye Contact:  Good  Speech:  Clear and Coherent  Volume:  Normal  Mood:   fine  Affect:  Appropriate, Congruent, and calm  Thought Process:  Coherent  Orientation:  Full (Time, Place, and Person)  Thought Content: Logical   Suicidal Thoughts:  No  Homicidal Thoughts:  No  Memory:  Immediate;   Good  Judgement:  Fair  Insight:  Shallow  Psychomotor Activity:  Normal  Concentration:  Concentration: Good and Attention Span: Good  Recall:  Good  Fund of Knowledge: Good  Language: Good  Akathisia:  No  Handed:  Right  AIMS (if indicated): not done  Assets:  Communication Skills Desire for Improvement  ADL's:  Intact  Cognition: WNL  Sleep:  Poor   Screenings: PHQ2-9    Flowsheet Row Office Visit from 10/12/2020 in [facility] Video Visit from 07/10/2020 in [facility]  PHQ-2 Total Score 0 1      Flowsheet Row Office Visit from 10/12/2020 in [facility] Video Visit from 07/10/2020 in [facility] ED to Hosp-Admission (Discharged) from 10/03/2019 in [facility]  C-SSRS RISK CATEGORY No Risk No Risk No Risk        Assessment and Plan:  DIAMANTINA EDINGER is a 80 y.o. year old female with a history of schizophrenia,hypertension, type II diabetes, who presents for follow up appointment for below.   1. Schizophrenia, unspecified type (HCC) There has been overall improvement in behavioral symptoms since the last visit, although she continues to have hallucinations at baseline.  We will continue current dose of olanzapine to target schizophrenia.  Noted that she had a history of  altered  mental status in the context of hyponatremia/HPI in the past.  She reportedly has fluctuating in glucose; the staff was advised to contact with PCP as needed.   2. Insomnia, unspecified type Although she has already morning awakening, she sleeps 8 hours on most days.  Will continue current dose of Ambien to target insomnia.  Noted that although this medication is to be avoided in geriatric population, she has not had any side effect with good benefit.  Will continue to monitor.   3. Neurocognitive disorder Unchanged.  She does have cognitive deficits as characterized by MoCA.  We will continue to monitor.   This clinician has discussed the side effect associated with medication prescribed during this encounter. Please refer to notes in the previous encounters for more details.    Plan  1. Continue olanzapine 20 mg at night (- pharmacy will contact us if she needs refill) 2. Continue Zolpidem 5 mg at night as needed for sleep- the staff will contact us when they need a refill 3. Next appointment-  3/14 at 10 AM for 30 mins, video [name]. [phone] 4. Send instruction note to [name], Fax: [phone]   The patient demonstrates the following risk factors for suicide: Chronic risk factors for suicide include: psychiatric disorder of schizophrenia. Acute risk factors for suicide include: unemployment. Protective factors for this patient include: positive social support. Considering these factors, the overall suicide risk at this point appears to be low. Patient is appropriate for outpatient follow up.      [name], MD 03/09/2021, 10:33 AM

## 2021-03-09 ENCOUNTER — Encounter: Payer: Self-pay | Admitting: Psychiatry

## 2021-03-09 ENCOUNTER — Telehealth (INDEPENDENT_AMBULATORY_CARE_PROVIDER_SITE_OTHER): Payer: Medicare Other | Admitting: Psychiatry

## 2021-03-09 ENCOUNTER — Other Ambulatory Visit: Payer: Self-pay

## 2021-03-09 DIAGNOSIS — G4700 Insomnia, unspecified: Secondary | ICD-10-CM

## 2021-03-09 DIAGNOSIS — R419 Unspecified symptoms and signs involving cognitive functions and awareness: Secondary | ICD-10-CM

## 2021-03-09 DIAGNOSIS — F209 Schizophrenia, unspecified: Secondary | ICD-10-CM

## 2021-03-09 MED ORDER — OLANZAPINE 20 MG PO TABS: 20.0000 mg | ORAL_TABLET | Freq: Every day | ORAL | 2 refills | Status: DC

## 2021-03-09 NOTE — Patient Instructions (Signed)
1. Continue olanzapine 20 mg at night  2. Continue Zolpidem 5 mg at night as needed for sleep 3. Next appointment-  3/14 at 10 AM

## 2021-03-30 DIAGNOSIS — H401131 Primary open-angle glaucoma, bilateral, mild stage: Secondary | ICD-10-CM | POA: Diagnosis not present

## 2021-03-30 DIAGNOSIS — I10 Essential (primary) hypertension: Secondary | ICD-10-CM | POA: Diagnosis not present

## 2021-03-30 DIAGNOSIS — Z961 Presence of intraocular lens: Secondary | ICD-10-CM | POA: Diagnosis not present

## 2021-03-30 DIAGNOSIS — F203 Undifferentiated schizophrenia: Secondary | ICD-10-CM | POA: Diagnosis not present

## 2021-03-30 DIAGNOSIS — E119 Type 2 diabetes mellitus without complications: Secondary | ICD-10-CM | POA: Diagnosis not present

## 2021-04-13 DIAGNOSIS — E1142 Type 2 diabetes mellitus with diabetic polyneuropathy: Secondary | ICD-10-CM | POA: Diagnosis not present

## 2021-04-13 DIAGNOSIS — B351 Tinea unguium: Secondary | ICD-10-CM | POA: Diagnosis not present

## 2021-04-13 DIAGNOSIS — I739 Peripheral vascular disease, unspecified: Secondary | ICD-10-CM | POA: Diagnosis not present

## 2021-04-30 DIAGNOSIS — I10 Essential (primary) hypertension: Secondary | ICD-10-CM | POA: Diagnosis not present

## 2021-04-30 DIAGNOSIS — E119 Type 2 diabetes mellitus without complications: Secondary | ICD-10-CM | POA: Diagnosis not present

## 2021-06-07 NOTE — Progress Notes (Unsigned)
BH MD/PA/NP OP Progress Note  06/07/2021 8:20 AM Maria Pace  MRN:  [id number]  Chief Complaint: No chief complaint on file.  HPI: *** Visit Diagnosis: No diagnosis found.  Past Psychiatric History: Please see initial evaluation for full details. I have reviewed the history. No updates at this time.     Past Medical History:  Past Medical History:  Diagnosis Date   Bipolar 1 disorder (HCC)    Essential hypertension    GERD (gastroesophageal reflux disease)    Hyperlipidemia    Irregular heart beat    Schizophrenia (HCC)    Type 2 diabetes mellitus (HCC)     Past Surgical History:  Procedure Laterality Date   YAG LASER APPLICATION Left 01/06/2015   Procedure: YAG LASER APPLICATION;  Surgeon: Jethro Bolus, MD;  Location: [facility];  Service: Ophthalmology;  Laterality: Left;    Family Psychiatric History: Please see initial evaluation for full details. I have reviewed the history. No updates at this time.     Family History:  Family History  Problem Relation Age of Onset   Hypertension Father    Diabetes type II Father     Social History:  Social History   Socioeconomic History   Marital status: Single    Spouse name: Not on file   Number of children: Not on file   Years of education: Not on file   Highest education level: Not on file  Occupational History   Not on file  Tobacco Use   Smoking status: Never   Smokeless tobacco: Never  Vaping Use   Vaping Use: Never used  Substance and Sexual Activity   Alcohol use: No   Drug use: No   Sexual activity: Never  Other Topics Concern   Not on file  Social History Narrative   Not on file   Social Determinants of Health   Financial Resource Strain: Not on file  Food Insecurity: Not on file  Transportation Needs: Not on file  Physical Activity: Not on file  Stress: Not on file  Social Connections: Not on file    Allergies:  Allergies  Allergen Reactions   Haloperidol Lactate Other (See Comments)     Couldn't urinate anymore    Metabolic Disorder Labs: Lab Results  Component Value Date   HGBA1C 6.3 (H) 10/03/2019   MPG 134.11 10/03/2019   No results found for: PROLACTIN Lab Results  Component Value Date   CHOL 168 10/10/2019   TRIG 54 10/10/2019   HDL 37 (L) 10/10/2019   CHOLHDL 4.5 10/10/2019   VLDL 11 10/10/2019   LDLCALC 120 (H) 10/10/2019   LDLCALC 99 10/06/2019   Lab Results  Component Value Date   TSH 1.481 10/06/2019   TSH 2.634 05/18/2017    Therapeutic Level Labs: No results found for: LITHIUM Lab Results  Component Value Date   VALPROATE 64 06/13/2018   VALPROATE 86.5 02/15/2018   No components found for:  CBMZ  Current Medications: Current Outpatient Medications  Medication Sig Dispense Refill   albuterol (VENTOLIN HFA) 108 (90 Base) MCG/ACT inhaler Inhale into the lungs.     apixaban (ELIQUIS) 5 MG TABS tablet Take 1 tablet (5 mg total) by mouth 2 (two) times daily. 60 tablet    atorvastatin (LIPITOR) 20 MG tablet Take 20 mg by mouth daily.     Camphor-Eucalyptus-Menthol (VICKS VAPORUB) 4.7-1.2-2.6 % OINT Apply 1 application topically daily. Apply once to toenails daily     cholecalciferol (VITAMIN D3) 10 MCG (400 UNIT)  TABS tablet Take 400 Units by mouth.     docusate sodium (COLACE) 100 MG capsule Take 200 mg by mouth daily.     FEROSUL 325 (65 Fe) MG tablet Take by mouth.     gabapentin (NEURONTIN) 400 MG capsule Take 400 mg by mouth at bedtime.     latanoprost (XALATAN) 0.005 % ophthalmic solution Place 1 drop into both eyes at bedtime.     lisinopril (ZESTRIL) 2.5 MG tablet Take 1 tablet (2.5 mg total) by mouth daily.     loratadine (CLARITIN) 10 MG tablet Take 10 mg by mouth daily.     metFORMIN (GLUCOPHAGE) 500 MG tablet Take 500 mg by mouth 2 (two) times daily with a meal.     metoprolol tartrate (LOPRESSOR) 25 MG tablet Take 1 tablet (25 mg total) by mouth 2 (two) times daily.     Multiple Vitamin (MULTIVITAMIN) tablet Take 1 tablet by mouth  daily.     OLANZapine (ZYPREXA) 20 MG tablet Take 1 tablet (20 mg total) by mouth at bedtime. 30 tablet 2   pantoprazole (PROTONIX) 40 MG tablet Take 40 mg by mouth daily.     polyethylene glycol powder (GLYCOLAX/MIRALAX) powder Take 17 g by mouth daily as needed for mild constipation or moderate constipation.     senna-docusate (SENOKOT-S) 8.6-50 MG tablet Take 2 tablets by mouth at bedtime. 60 tablet 0   vitamin B-12 (CYANOCOBALAMIN) 500 MCG tablet Take 1 tablet (500 mcg total) by mouth daily.     Vitamin D, Cholecalciferol, 25 MCG (1000 UT) TABS Take 1 tablet by mouth daily.     zolpidem (AMBIEN) 5 MG tablet Take 5 mg by mouth at bedtime as needed for sleep.     No current facility-administered medications for this visit.     Musculoskeletal: Strength & Muscle Tone:  N/A Gait & Station:  N/A Patient leans: N/A  Psychiatric Specialty Exam: Review of Systems  There were no vitals taken for this visit.There is no height or weight on file to calculate BMI.  General Appearance: {Appearance:22683}  Eye Contact:  {B[facility] EYE CONTACT:22684}  Speech:  Clear and Coherent  Volume:  Normal  Mood:  {BHH MOOD:22306}  Affect:  {Affect (PAA):22687}  Thought Process:  Coherent  Orientation:  Full (Time, Place, and Person)  Thought Content: Logical   Suicidal Thoughts:  {ST/HT (PAA):22692}  Homicidal Thoughts:  {ST/HT (PAA):22692}  Memory:  Immediate;   Good  Judgement:  {Judgement (PAA):22694}  Insight:  {Insight (PAA):22695}  Psychomotor Activity:  Normal  Concentration:  Concentration: Good and Attention Span: Good  Recall:  Good  Fund of Knowledge: Good  Language: Good  Akathisia:  No  Handed:  Right  AIMS (if indicated): not done  Assets:  Communication Skills Desire for Improvement  ADL's:  Intact  Cognition: WNL  Sleep:  {B[facility] GOOD/FAIR/POOR:22877}   Screenings: PHQ2-9    Flowsheet Row [facility] Video Visit from  [facility]  PHQ-2 Total Score 0 1      Flowsheet Row [facility] Video Visit from 07/10/2020 [facility] to Hosp-Admission (Discharged) from 10/03/2019 in [facility]  C-SSRS RISK CATEGORY No Risk No Risk No Risk        Assessment and Plan:  Maria Pace is a 81 y.o. year old female with a history of schizophrenia,hypertension, type II diabetes, who presents for  follow up appointment for below.     1. Schizophrenia, unspecified type (HCC) There has been overall improvement in behavioral symptoms since the last visit, although she continues to have hallucinations at baseline.  We will continue current dose of olanzapine to target schizophrenia.  Noted that she had a history of altered mental status in the context of hyponatremia/HPI in the past.  She reportedly has fluctuating in glucose; the staff was advised to contact with PCP as needed.    2. Insomnia, unspecified type Although she has already morning awakening, she sleeps 8 hours on most days.  Will continue current dose of Ambien to target insomnia.  Noted that although this medication is to be avoided in geriatric population, she has not had any side effect with good benefit.  Will continue to monitor.    3. Neurocognitive disorder Unchanged.  She does have cognitive deficits as characterized by MoCA.  We will continue to monitor.    This clinician has discussed the side effect associated with medication prescribed during this encounter. Please refer to notes in the previous encounters for more details.     Plan   1. Continue olanzapine 20 mg at night (- pharmacy will contact us if she needs refill) 2. Continue Zolpidem 5 mg at night as needed for sleep- the staff will contact us when they need a refill 3. Next appointment-  3/14 at 10 AM for 30 mins, video [name].  [phone] 4. Send instruction note to [name], Fax: [phone]   The patient demonstrates the following risk factors for suicide: Chronic risk factors for suicide include: psychiatric disorder of schizophrenia. Acute risk factors for suicide include: unemployment. Protective factors for this patient include: positive social support. Considering these factors, the overall suicide risk at this point appears to be low. Patient is appropriate for outpatient follow up.  Collaboration of Care: Collaboration of Care: {BH OP Collaboration of Care:21014065}  Patient/Guardian was advised Release of Information must be obtained prior to any record release in order to collaborate their care with an outside provider. Patient/Guardian was advised if they have not already done so to contact the registration department to sign all necessary forms in order for [place] to release information regarding their care.   Consent: Patient/Guardian gives verbal consent for treatment and assignment of benefits for services provided during this visit. Patient/Guardian expressed understanding and agreed to proceed.    [name], MD 06/07/2021, 8:20 AM

## 2021-06-08 ENCOUNTER — Other Ambulatory Visit: Payer: Self-pay

## 2021-06-08 ENCOUNTER — Telehealth (INDEPENDENT_AMBULATORY_CARE_PROVIDER_SITE_OTHER): Payer: Medicare Other | Admitting: Psychiatry

## 2021-06-08 ENCOUNTER — Encounter: Payer: Self-pay | Admitting: Psychiatry

## 2021-06-08 DIAGNOSIS — F209 Schizophrenia, unspecified: Secondary | ICD-10-CM | POA: Diagnosis not present

## 2021-06-08 DIAGNOSIS — R419 Unspecified symptoms and signs involving cognitive functions and awareness: Secondary | ICD-10-CM

## 2021-06-08 DIAGNOSIS — G4700 Insomnia, unspecified: Secondary | ICD-10-CM | POA: Diagnosis not present

## 2021-06-08 NOTE — Patient Instructions (Addendum)
Continue olanzapine 20 mg at night ?Start melatonin 3 mg at night, 2 hours before going to bed ?Next appointment: 6/12 at 10 AM, in person ? ?The next visit will be in person visit. Please arrive 15 mins before the scheduled time.  ? ?[facility]  ?Address: [address], [place], [place] [address]   ? ? ?

## 2021-07-01 ENCOUNTER — Other Ambulatory Visit: Payer: Self-pay | Admitting: Psychiatry

## 2021-07-13 ENCOUNTER — Telehealth: Payer: Self-pay | Admitting: Psychiatry
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
## 2021-07-24 NOTE — Progress Notes (Signed)
Virtual Visit via Video Note ? ?I connected with Maria Pace on 07/27/21 at 11:30 AM EDT by a video enabled telemedicine application and verified that I am speaking with the correct person using two identifiers. ? ?Location: ?Patient: group home ?Provider: office ?Persons participated in the visit- patient, provider, staff/caregiver at group home ?  ?I discussed the limitations of evaluation and management by telemedicine and the availability of in person appointments. The patient expressed understanding and agreed to proceed. ?  ?I discussed the assessment and treatment plan with the patient. The patient was provided an opportunity to ask questions and all were answered. The patient agreed with the plan and demonstrated an understanding of the instructions. ?  ?The patient was advised to call back or seek an in-person evaluation if the symptoms worsen or if the condition fails to improve as anticipated. ? ?I provided 12 minutes of non-face-to-face time during this encounter. ? ? ?Maria Clay, MD ? ? ? ?[place] MD/PA/NP OP Progress Note ? ?07/27/2021 12:10 PM ?Maria Pace  ?MRN:  [id number] ? ?Chief Complaint:  ?Chief Complaint  ?Patient presents with  ? Follow-up  ? Other  ? ?HPI:  ?This is a follow-up appointment for schizophrenia and insomnia.  ?This appointment was made sooner due to the request from the administration at the group home.  ? ?The patient is a poor historian.  Majority of the history is obtained from [name] at the group home. ? ?Maria Pace states that Maria Pace was angry and yelling at others 2 weeks ago when they had a new resident.  She thought this person was from the street.  She was reassured that this was not was coming from the other hospital. (Maria Pace does not know the details as she was not at the scent).  Although she tends to get mad when things do not go as she wishes, it has ben her baseline.  [name] has been getting better lately. Admin was wondering if she could get  back on Depakote. [place] talks to [employer].  She has occasional insomnia.  She eats well.  She takes medication regularly.  No known behavioral issues otherwise.  ? ?[name] feels comfortable with this new resident. When she was asked about the incident, she states that "Maria Pace (another resident, who deceased in 16-Jun-2022)." She denies paranoia. She denies feeling depressed or anxiety.  She denies SI, HI, hallucinations.  ? ?Visit Diagnosis:  ?  ICD-10-CM   ?1. Schizophrenia, unspecified type ([place])  F20.9   ?  ?2. Insomnia, unspecified type  G47.00   ?  ?3. Neurocognitive disorder  R41.9   ?  ? ? ?Past Psychiatric History: Please see initial evaluation for full details. I have reviewed the history. No updates at this time.  ?  ? ?Past Medical History:  ?Past Medical History:  ?Diagnosis Date  ? Bipolar 1 disorder ([place])   ? Essential hypertension   ? GERD (gastroesophageal reflux disease)   ? Hyperlipidemia   ? Irregular heart beat   ? Schizophrenia ([place])   ? Type 2 diabetes mellitus ([place])   ?  ?Past Surgical History:  ?Procedure Laterality Date  ? YAG LASER APPLICATION Left [id number]  ? Procedure: YAG LASER APPLICATION;  Surgeon: Rutherford Guys, MD;  Location: [facility];  Service: Ophthalmology;  Laterality: Left;  ? ? ?Family Psychiatric History: Please see initial evaluation for full details. I have reviewed the history. No updates at this time.  ?  ? ?Family History:  ?  Family History  ?Problem Relation Age of Onset  ? Hypertension Father   ? Diabetes type II Father   ? ? ?Social History:  ?Social History  ? ?Socioeconomic History  ? Marital status: Single  ?  Spouse name: Not on file  ? Number of children: Not on file  ? Years of education: Not on file  ? Highest education level: Not on file  ?Occupational History  ? Not on file  ?Tobacco Use  ? Smoking status: Never  ? Smokeless tobacco: Never  ?Vaping Use  ? Vaping Use: Never used  ?Substance and Sexual Activity  ? Alcohol use: No  ? Drug use: No  ?  Sexual activity: Never  ?Other Topics Concern  ? Not on file  ?Social History Narrative  ? Not on file  ? ?Social Determinants of Health  ? ?Financial Resource Strain: Not on file  ?Food Insecurity: Not on file  ?Transportation Needs: Not on file  ?Physical Activity: Not on file  ?Stress: Not on file  ?Social Connections: Not on file  ? ? ?Allergies:  ?Allergies  ?Allergen Reactions  ? Haloperidol Lactate Other (See Comments)  ?  Couldn't urinate anymore  ? ? ?Metabolic Disorder Labs: ?Lab Results  ?Component Value Date  ? HGBA1C 6.3 (H) 10/03/2019  ? MPG 134.11 10/03/2019  ? ?No results found for: PROLACTIN ?Lab Results  ?Component Value Date  ? CHOL 168 10/10/2019  ? TRIG 54 10/10/2019  ? HDL 37 (L) 10/10/2019  ? CHOLHDL 4.5 10/10/2019  ? VLDL 11 10/10/2019  ? LDLCALC 120 (H) 10/10/2019  ? [place] 99 10/06/2019  ? ?Lab Results  ?Component Value Date  ? TSH 1.481 10/06/2019  ? TSH 2.634 05/18/2017  ? ? ?Therapeutic Level Labs: ?No results found for: LITHIUM ?Lab Results  ?Component Value Date  ? VALPROATE 64 06/13/2018  ? VALPROATE 86.5 02/15/2018  ? ?No components found for:  CBMZ ? ?Current Medications: ?Current Outpatient Medications  ?Medication Sig Dispense Refill  ? albuterol (VENTOLIN HFA) 108 (90 Base) MCG/ACT inhaler Inhale into the lungs.    ? apixaban (ELIQUIS) 5 MG TABS tablet Take 1 tablet (5 mg total) by mouth 2 (two) times daily. 60 tablet   ? atorvastatin (LIPITOR) 20 MG tablet Take 20 mg by mouth daily.    ? Camphor-Eucalyptus-Menthol (VICKS VAPORUB) 4.7-1.2-2.6 % OINT Apply 1 application topically daily. Apply once to toenails daily    ? cholecalciferol (VITAMIN D3) 10 MCG (400 UNIT) TABS tablet Take 400 Units by mouth.    ? docusate sodium (COLACE) 100 MG capsule Take 200 mg by mouth daily.    ? FEROSUL 325 (65 Fe) MG tablet Take by mouth.    ? gabapentin (NEURONTIN) 400 MG capsule Take 400 mg by mouth at bedtime.    ? latanoprost (XALATAN) 0.005 % ophthalmic solution Place 1 drop into both eyes  at bedtime.    ? lisinopril (ZESTRIL) 2.5 MG tablet Take 1 tablet (2.5 mg total) by mouth daily.    ? loratadine (CLARITIN) 10 MG tablet Take 10 mg by mouth daily.    ? metFORMIN (GLUCOPHAGE) 500 MG tablet Take 500 mg by mouth 2 (two) times daily with a meal.    ? metoprolol tartrate (LOPRESSOR) 25 MG tablet Take 1 tablet (25 mg total) by mouth 2 (two) times daily.    ? Multiple Vitamin (MULTIVITAMIN) tablet Take 1 tablet by mouth daily.    ? OLANZapine (ZYPREXA) 20 MG tablet Take 1 tablet (20 mg total) by  mouth at bedtime. 30 tablet 2  ? pantoprazole (PROTONIX) 40 MG tablet Take 40 mg by mouth daily.    ? polyethylene glycol powder (GLYCOLAX/MIRALAX) powder Take 17 g by mouth daily as needed for mild constipation or moderate constipation.    ? senna-docusate (SENOKOT-S) 8.6-50 MG tablet Take 2 tablets by mouth at bedtime. 60 tablet 0  ? vitamin B-12 (CYANOCOBALAMIN) 500 MCG tablet Take 1 tablet (500 mcg total) by mouth daily.    ? Vitamin D, Cholecalciferol, 25 MCG (1000 UT) TABS Take 1 tablet by mouth daily.    ? zolpidem (AMBIEN) 5 MG tablet Take 5 mg by mouth at bedtime as needed for sleep.    ? ?No current facility-administered medications for this visit.  ? ? ? ?Musculoskeletal: ?Strength & Muscle Tone:  N/A ?Gait & Station:  N/A ?Patient leans: N/A ? ?Psychiatric Specialty Exam: ?Review of Systems  ?Psychiatric/Behavioral:  Positive for sleep disturbance. Negative for agitation, behavioral problems, confusion, decreased concentration, dysphoric mood, hallucinations, self-injury and suicidal ideas. The patient is not nervous/anxious and is not hyperactive.   ?All other systems reviewed and are negative.  ?There were no vitals taken for this visit.There is no height or weight on file to calculate BMI.  ?General Appearance: Fairly Groomed  ?Eye Contact:  Good  ?Speech:  Clear and Coherent  ?Volume:  Normal  ?Mood:   good  ?Affect:  Appropriate, Congruent, and Restricted  ?Thought Process:  Disorganized   ?Orientation:  Other:  oriented to self  ?Thought Content: Logical   ?Suicidal Thoughts:  No  ?Homicidal Thoughts:  No  ?Memory:  Immediate;   Fair  ?Judgement:  Fair  ?Insight:  Shallow  ?Psychomotor Activity:  N

## 2021-07-27 ENCOUNTER — Encounter: Payer: Self-pay | Admitting: Psychiatry

## 2021-07-27 ENCOUNTER — Telehealth (INDEPENDENT_AMBULATORY_CARE_PROVIDER_SITE_OTHER): Payer: Medicare (Managed Care) | Admitting: Psychiatry

## 2021-07-27 DIAGNOSIS — R419 Unspecified symptoms and signs involving cognitive functions and awareness: Secondary | ICD-10-CM | POA: Diagnosis not present

## 2021-07-27 DIAGNOSIS — F209 Schizophrenia, unspecified: Secondary | ICD-10-CM

## 2021-07-27 DIAGNOSIS — G4700 Insomnia, unspecified: Secondary | ICD-10-CM

## 2021-09-01 NOTE — Progress Notes (Signed)
[facility] MD/PA/NP OP Progress Note  09/06/2021 10:48 AM Maria Pace  MRN:  [id number]  Chief Complaint:  Chief Complaint  Patient presents with  . Follow-up   HPI:  This is a follow-up appointment for schizophrenia and a neurocognitive disorder.  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  She states that she has been doing fine. She brought two drawings to this Clinical research associate. She spends time doing drawing.  Although she is doing good at group home, she talks about another residents, who does not like her cloths as it it associated with death (she denies that she was told this way by this resident).  She sleeps well.  She has good appetite.  She denies SI, HI.  She denies any hallucinations.   [name] states that she cuts her hair with finger file, stating that jesus cut her hair.  She states that she hears Fonnie Birkenhead.  She tends to stay in the room most of the time, and wakes up out in the morning.  She tends to disturb other residents.   She went to other lady's room and asks to show her clothes.  She tends to be a picky eater. No known SI/HI or behavioral aggression.  She takes medication regularly.   Functional Status Instrumental Activities of Daily Living (IADLs):  Maria Pace is independent in the following:  Requires assistance with the following: managing finances, medications, driving  Activities of Daily Living (ADLs):  Maria Pace is independent in the following:  feeding, continence, walking (walker), grooming Requires assistance with the following: bathing,  toileting (occasionally have incontinence)   Wt Readings from Last 3 Encounters:  09/06/21 194 lb 3.2 oz (88.1 kg)  10/07/19 194 lb 10.7 oz (88.3 kg)  04/09/19 204 lb (92.5 kg)      Visit Diagnosis:    ICD-10-CM   1. Schizophrenia, unspecified type ([facility])  F20.9     2. Neurocognitive disorder  R41.9     3. Insomnia, unspecified type  G47.00       Past Psychiatric History: Please see initial  evaluation for full details. I have reviewed the history. No updates at this time.     Past Medical History:  Past Medical History:  Diagnosis Date  . Bipolar 1 disorder ([facility])   . Essential hypertension   . GERD (gastroesophageal reflux disease)   . Hyperlipidemia   . Irregular heart beat   . Schizophrenia ([facility])   . Type 2 diabetes mellitus ([facility])     Past Surgical History:  Procedure Laterality Date  . YAG LASER APPLICATION Left 01/06/2015   Procedure: YAG LASER APPLICATION;  Surgeon: Jethro Bolus, MD;  Location: [facility];  Service: Ophthalmology;  Laterality: Left;    Family Psychiatric History: Please see initial evaluation for full details. I have reviewed the history. No updates at this time.     Family History:  Family History  Problem Relation Age of Onset  . Hypertension Father   . Diabetes type II Father     Social History:  Social History   Socioeconomic History  . Marital status: Single    Spouse name: Not on file  . Number of children: Not on file  . Years of education: Not on file  . Highest education level: Not on file  Occupational History  . Not on file  Tobacco Use  . Smoking status: Never  . Smokeless tobacco: Never  Vaping Use  . Vaping Use: Never used  Substance and Sexual Activity  . Alcohol use: No  . Drug use: No  . Sexual activity: Never  Other Topics Concern  . Not on file  Social History Narrative  . Not on file   Social Determinants of Health   Financial Resource Strain: Not on file  Food Insecurity: Not on file  Transportation Needs: Not on file  Physical Activity: Not on file  Stress: Not on file  Social Connections: Not on file    Allergies:  Allergies  Allergen Reactions  . Haloperidol Lactate Other (See Comments)    Couldn't urinate anymore    Metabolic Disorder Labs: Lab Results  Component Value Date   HGBA1C 6.3 (H) 10/03/2019   MPG 134.11 10/03/2019   No results found for: "PROLACTIN" Lab Results   Component Value Date   CHOL 168 10/10/2019   TRIG 54 10/10/2019   HDL 37 (L) 10/10/2019   CHOLHDL 4.5 10/10/2019   VLDL 11 10/10/2019   LDLCALC 120 (H) 10/10/2019   LDLCALC 99 10/06/2019   Lab Results  Component Value Date   TSH 1.481 10/06/2019   TSH 2.634 05/18/2017    Therapeutic Level Labs: No results found for: "LITHIUM" Lab Results  Component Value Date   VALPROATE 64 06/13/2018   VALPROATE 86.5 02/15/2018   No results found for: "CBMZ"  Current Medications: Current Outpatient Medications  Medication Sig Dispense Refill  . albuterol (VENTOLIN HFA) 108 (90 Base) MCG/ACT inhaler Inhale into the lungs.    Marland Kitchen apixaban (ELIQUIS) 5 MG TABS tablet Take 1 tablet (5 mg total) by mouth 2 (two) times daily. 60 tablet   . atorvastatin (LIPITOR) 20 MG tablet Take 20 mg by mouth daily.    . Camphor-Eucalyptus-Menthol (VICKS VAPORUB) 4.7-1.2-2.6 % OINT Apply 1 application topically daily. Apply once to toenails daily    . cholecalciferol (VITAMIN D3) 10 MCG (400 UNIT) TABS tablet Take 400 Units by mouth.    . docusate sodium (COLACE) 100 MG capsule Take 200 mg by mouth daily.    . FEROSUL 325 (65 Fe) MG tablet Take by mouth.    . furosemide (LASIX) 20 MG tablet Take 20 mg by mouth.    . gabapentin (NEURONTIN) 400 MG capsule Take 400 mg by mouth at bedtime.    Marland Kitchen latanoprost (XALATAN) 0.005 % ophthalmic solution Place 1 drop into both eyes at bedtime.    Marland Kitchen lisinopril (ZESTRIL) 2.5 MG tablet Take 1 tablet (2.5 mg total) by mouth daily.    Marland Kitchen loratadine (CLARITIN) 10 MG tablet Take 10 mg by mouth daily.    . metFORMIN (GLUCOPHAGE) 500 MG tablet Take 500 mg by mouth 2 (two) times daily with a meal.    . metoprolol tartrate (LOPRESSOR) 25 MG tablet Take 1 tablet (25 mg total) by mouth 2 (two) times daily.    . Multiple Vitamin (MULTIVITAMIN) tablet Take 1 tablet by mouth daily.    . pantoprazole (PROTONIX) 40 MG tablet Take 40 mg by mouth daily.    . polyethylene glycol powder  (GLYCOLAX/MIRALAX) powder Take 17 g by mouth daily as needed for mild constipation or moderate constipation.    . senna-docusate (SENOKOT-S) 8.6-50 MG tablet Take 2 tablets by mouth at bedtime. 60 tablet 0  . vitamin B-12 (CYANOCOBALAMIN) 500 MCG tablet Take 1 tablet (500 mcg total) by mouth daily.    Marland Kitchen zolpidem (AMBIEN) 5 MG tablet Take 5 mg by mouth at bedtime as needed for sleep.    Melene Muller ON 09/30/2021] OLANZapine (ZYPREXA)  20 MG tablet Take 1 tablet (20 mg total) by mouth at bedtime. 30 tablet 2   No current facility-administered medications for this visit.     Musculoskeletal: Strength & Muscle Tone: within normal limits Gait & Station: normal Patient leans: N/A  Psychiatric Specialty Exam: Review of Systems  Psychiatric/Behavioral:  Positive for behavioral problems, hallucinations and sleep disturbance. Negative for agitation, confusion, decreased concentration, dysphoric mood, self-injury and suicidal ideas. The patient is nervous/anxious. The patient is not hyperactive.   All other systems reviewed and are negative.  Blood pressure 132/84, pulse 99, temperature 98.1 F (36.7 C), temperature source Temporal, weight 194 lb 3.2 oz (88.1 kg).Body mass index is 37.93 kg/m.  General Appearance: Fairly Groomed  Eye Contact:  Good  Speech:  Clear and Coherent  Volume:  Normal  Mood:   good  Affect:  Appropriate, Congruent, and slightly restricted  Thought Process:  Coherent  Orientation:  Full (Time, Place, and Person)  Thought Content: Logical   Suicidal Thoughts:  No  Homicidal Thoughts:  No  Memory:  Immediate;   Good  Judgement:  Fair  Insight:  Present  Psychomotor Activity:  Normal  Concentration:  Concentration: Fair and Attention Span: Fair  Recall:  Poor  Fund of Knowledge: Good  Language: Good  Akathisia:  No  Handed:  Right  AIMS (if indicated): not done  Assets:  Social Support  ADL's:  Impaired  Cognition: Impaired,  Moderate  Sleep:  Fair    Screenings: Equities trader Office Visit from 09/06/2021 in [facility] Office Visit from 10/12/2020 in [facility] Video Visit from 07/10/2020 in [facility]  PHQ-2 Total Score 3 0 1  PHQ-9 Total Score 14 -- --      Flowsheet Row Office Visit from 09/06/2021 in [facility] Office Visit from 10/12/2020 in [facility] Video Visit from 07/10/2020 in [facility]  C-SSRS RISK CATEGORY Error: Q3, 4, or 5 should not be populated when Q2 is No No Risk No Risk        Assessment and Plan:  LEONA ALEN is a 81 y.o. year old female with a history of schizophrenia, neurocognitive disorder, hypertension, type II diabetes, who presents for follow up appointment for below.   1. Schizophrenia, unspecified type (HCC) 2. Neurocognitive disorder She continues to have occasional behavioral disturbances of becoming loud in the context of having any worse since being in a group home, and has occasional hallucinations, sundowning episode.  Will continue current dose of olanzapine at this time to target schizophrenia/neuropsychiatric symptoms.  Staff was advised to continue to work on behavioral modification/maintaining a routine. Noted that although she used to be on Depakote, which was discontinued a few years ago due to concern of drowsiness.   3. Insomnia, unspecified type She has occasional sundowning episode.  Will continue melatonin for this.   This clinician has discussed the side effect associated with medication prescribed during this encounter. Please refer to notes in the previous encounters for more details.     Plan Continue olanzapine 20 mg at night (- pharmacy will contact us if she needs refills Continue melatonin 3 mg at night, 2 hours before going to bed Next appointment: 9/7 at 11:30 for 30 mins, video  Maria Pace. [phone]) (She is on Ambien 5 mg)    The patient demonstrates the following risk factors for suicide: Chronic risk factors for suicide include: psychiatric disorder of  schizophrenia. Acute risk factors for suicide include: unemployment. Protective factors for this patient include: positive social support. Considering these factors, the overall suicide risk at this point appears to be low. Patient is appropriate for outpatient follow up.      Collaboration of Care: Collaboration of Care: Other N/A  Patient/Guardian was advised Release of Information must be obtained prior to any record release in order to collaborate their care with an outside provider. Patient/Guardian was advised if they have not already done so to contact the registration department to sign all necessary forms in order for us to release information regarding their care.   Consent: Patient/Guardian gives verbal consent for treatment and assignment of benefits for services provided during this visit. Patient/Guardian expressed understanding and agreed to proceed.    Neysa Hottereina Senita Corredor, MD 09/06/2021, 10:48 AM

## 2021-09-06 ENCOUNTER — Ambulatory Visit (INDEPENDENT_AMBULATORY_CARE_PROVIDER_SITE_OTHER): Payer: Medicare (Managed Care) | Admitting: Psychiatry

## 2021-09-06 ENCOUNTER — Encounter: Payer: Self-pay | Admitting: Psychiatry

## 2021-09-06 VITALS — BP 132/84 | HR 99 | Temp 98.1°F | Wt 194.2 lb

## 2021-09-06 DIAGNOSIS — R419 Unspecified symptoms and signs involving cognitive functions and awareness: Secondary | ICD-10-CM

## 2021-09-06 DIAGNOSIS — F209 Schizophrenia, unspecified: Secondary | ICD-10-CM

## 2021-09-06 DIAGNOSIS — G4700 Insomnia, unspecified: Secondary | ICD-10-CM | POA: Diagnosis not present

## 2021-09-06 MED ORDER — OLANZAPINE 20 MG PO TABS: 20.0000 mg | ORAL_TABLET | Freq: Every day | ORAL | 2 refills | Status: DC

## 2021-09-06 NOTE — Patient Instructions (Signed)
Continue olanzapine 20 mg at night Continue melatonin 3 mg at night, 2 hours before going to bed Next appointment: 9/7 at 11:30

## 2021-12-01 NOTE — Progress Notes (Signed)
Virtual Visit via Video Note  I connected with Maria Pace on 12/02/21 at 11:30 AM EDT by a video enabled telemedicine application and verified that I am speaking with the correct person using two identifiers.  Location: Patient: group home Provider: office Persons participated in the visit- patient, provider    I discussed the limitations of evaluation and management by telemedicine and the availability of in person appointments. The patient expressed understanding and agreed to proceed.    I discussed the assessment and treatment plan with the patient. The patient was provided an opportunity to ask questions and all were answered. The patient agreed with the plan and demonstrated an understanding of the instructions.   The patient was advised to call back or seek an in-person evaluation if the symptoms worsen or if the condition fails to improve as anticipated.  I provided 15 minutes of non-face-to-face time during this encounter.   [name], MD    [facility] MD/PA/NP OP Progress Note  12/02/2021 12:29 PM Maria Pace  MRN:  [id number]  Chief Complaint:  Chief Complaint  Patient presents with   Follow-up   Other   HPI:  This is a follow-up appointment for schizophrenia, cognitive disorder and insomnia.  She states that she has been doing well.  She enjoys drawing, and is planning to do crochet.  She states that her mood has been good. She denies AH,VH, She denies SI/HI. She occasionally interrupts the conversation while Maria Pace is trying to talk about how Maria Pace is doing.   Maria Pace, the staff presents to the interview.  Maria Pace has been negative towards the staff and other peers.  She is "talking junk," although there is no physical aggression.  She occasionally gets up at night, and talking by herself in the morning.  Maria Pace states that Maria Pace said that the church (where Maria Pace goes) does not like black people, although Maria Pace does not think they would say this kinds of thing.   Maria Pace has started to have a roommate since July, and Maria Pace criticizes this roommate. Maria Pace eats fair. She takes medication regularly.  Maria Pace has an upcoming appointment with her PCP, and they are planning evaluate to evaluate for tachycardia.  Functional Status Instrumental Activities of Daily Living (IADLs):  Maria Pace is independent in the following:  Requires assistance with the following: managing finances, medications, driving  Activities of Daily Living (ADLs):  Maria Pace is independent in the following:  feeding, continence, walking (walker), grooming Requires assistance with the following: bathing,  toileting (occasionally have incontinence)   Visit Diagnosis:    ICD-10-CM   1. Schizophrenia, unspecified type (HCC)  F20.9     2. Neurocognitive disorder  R41.9     3. Insomnia, unspecified type  G47.00       Past Psychiatric History: Please see initial evaluation for full details. I have reviewed the history. No updates at this time.     Past Medical History:  Past Medical History:  Diagnosis Date   Bipolar 1 disorder (HCC)    Essential hypertension    GERD (gastroesophageal reflux disease)    Hyperlipidemia    Irregular heart beat    Schizophrenia (HCC)    Type 2 diabetes mellitus (HCC)     Past Surgical History:  Procedure Laterality Date   YAG LASER APPLICATION Left 01/06/2015   Procedure: YAG LASER APPLICATION;  Surgeon: Jethro Bolus, MD;  Location: [facility];  Service: Ophthalmology;  Laterality: Left;    Family Psychiatric History: Please see  initial evaluation for full details. I have reviewed the history. No updates at this time.     Family History:  Family History  Problem Relation Age of Onset   Hypertension Father    Diabetes type II Father     Social History:  Social History   Socioeconomic History   Marital status: Single    Spouse name: Not on file   Number of children: Not on file   Years of education: Not on file    Highest education level: Not on file  Occupational History   Not on file  Tobacco Use   Smoking status: Never   Smokeless tobacco: Never  Vaping Use   Vaping Use: Never used  Substance and Sexual Activity   Alcohol use: No   Drug use: No   Sexual activity: Never  Other Topics Concern   Not on file  Social History Narrative   Not on file   Social Determinants of Health   Financial Resource Strain: Not on file  Food Insecurity: Not on file  Transportation Needs: Not on file  Physical Activity: Not on file  Stress: Not on file  Social Connections: Not on file    Allergies:  Allergies  Allergen Reactions   Haloperidol Lactate Other (See Comments)    Couldn't urinate anymore    Metabolic Disorder Labs: Lab Results  Component Value Date   HGBA1C 6.3 (H) 10/03/2019   MPG 134.11 10/03/2019   No results found for: "PROLACTIN" Lab Results  Component Value Date   CHOL 168 10/10/2019   TRIG 54 10/10/2019   HDL 37 (L) 10/10/2019   CHOLHDL 4.5 10/10/2019   VLDL 11 10/10/2019   LDLCALC 120 (H) 10/10/2019   LDLCALC 99 10/06/2019   Lab Results  Component Value Date   TSH 1.481 10/06/2019   TSH 2.634 05/18/2017    Therapeutic Level Labs: No results found for: "LITHIUM" Lab Results  Component Value Date   VALPROATE 64 06/13/2018   VALPROATE 86.5 02/15/2018   No results found for: "CBMZ"  Current Medications: Current Outpatient Medications  Medication Sig Dispense Refill   albuterol (VENTOLIN HFA) 108 (90 Base) MCG/ACT inhaler Inhale into the lungs.     apixaban (ELIQUIS) 5 MG TABS tablet Take 1 tablet (5 mg total) by mouth 2 (two) times daily. 60 tablet    atorvastatin (LIPITOR) 20 MG tablet Take 20 mg by mouth daily.     Camphor-Eucalyptus-Menthol (VICKS VAPORUB) 4.7-1.2-2.6 % OINT Apply 1 application topically daily. Apply once to toenails daily     cholecalciferol (VITAMIN D3) 10 MCG (400 UNIT) TABS tablet Take 400 Units by mouth.     docusate sodium (COLACE)  100 MG capsule Take 200 mg by mouth daily.     FEROSUL 325 (65 Fe) MG tablet Take by mouth.     furosemide (LASIX) 20 MG tablet Take 20 mg by mouth.     gabapentin (NEURONTIN) 400 MG capsule Take 400 mg by mouth at bedtime.     latanoprost (XALATAN) 0.005 % ophthalmic solution Place 1 drop into both eyes at bedtime.     lisinopril (ZESTRIL) 2.5 MG tablet Take 1 tablet (2.5 mg total) by mouth daily.     loratadine (CLARITIN) 10 MG tablet Take 10 mg by mouth daily.     metFORMIN (GLUCOPHAGE) 500 MG tablet Take 500 mg by mouth 2 (two) times daily with a meal.     metoprolol tartrate (LOPRESSOR) 25 MG tablet Take 1 tablet (25 mg total) by  mouth 2 (two) times daily.     Multiple Vitamin (MULTIVITAMIN) tablet Take 1 tablet by mouth daily.     [START ON 12/30/2021] OLANZapine (ZYPREXA) 20 MG tablet Take 1 tablet (20 mg total) by mouth at bedtime. 30 tablet 2   pantoprazole (PROTONIX) 40 MG tablet Take 40 mg by mouth daily.     polyethylene glycol powder (GLYCOLAX/MIRALAX) powder Take 17 g by mouth daily as needed for mild constipation or moderate constipation.     senna-docusate (SENOKOT-S) 8.6-50 MG tablet Take 2 tablets by mouth at bedtime. 60 tablet 0   vitamin B-12 (CYANOCOBALAMIN) 500 MCG tablet Take 1 tablet (500 mcg total) by mouth daily.     zolpidem (AMBIEN) 5 MG tablet Take 5 mg by mouth at bedtime as needed for sleep.     No current facility-administered medications for this visit.     Musculoskeletal: Strength & Muscle Tone:  N/A Gait & Station:  N/A Patient leans: N/A  Psychiatric Specialty Exam: Review of Systems  Psychiatric/Behavioral:  Positive for hallucinations and sleep disturbance. Negative for agitation, behavioral problems, confusion, decreased concentration, dysphoric mood, self-injury and suicidal ideas. The patient is not nervous/anxious and is not hyperactive.   All other systems reviewed and are negative.   There were no vitals taken for this visit.There is no  height or weight on file to calculate BMI.  General Appearance: Fairly Groomed  Eye Contact:  Fair  Speech:  Clear and Coherent  Volume:  Normal  Mood:   good  Affect:  Appropriate, Congruent, and euthymic  Thought Process:  Coherent  Orientation:  Full (Time, Place, and Person)  Thought Content: Illogical   Suicidal Thoughts:  No  Homicidal Thoughts:  No  Memory:  Immediate;   Good  Judgement:  Fair  Insight:  Shallow  Psychomotor Activity:  Normal  Concentration:  Concentration: Good and Attention Span: Good  Recall:  Good  Fund of Knowledge: Good  Language: Good  Akathisia:  No  Handed:  Right  AIMS (if indicated): not done  Assets:  Communication Skills Desire for Improvement  ADL's:  Intact  Cognition: WNL  Sleep:  Poor   Screenings: PHQ2-9    Flowsheet Row Office Visit from 09/06/2021 in [facility] Office Visit from 10/12/2020 in [facility] Video Visit from 07/10/2020 in [facility]  PHQ-2 Total Score 3 0 1  PHQ-9 Total Score 14 -- --      Flowsheet Row Office Visit from 09/06/2021 in [facility] Office Visit from 10/12/2020 in [facility] Video Visit from 07/10/2020 in [facility]  C-SSRS RISK CATEGORY Error: Q3, 4, or 5 should not be populated when Q2 is No No Risk No Risk        Assessment and Plan:  Maria Pace is a 81 y.o. year old female with a history of schizophrenia, neurocognitive disorder, hypertension, type II diabetes, who presents for follow up appointment for below.    1. Schizophrenia, unspecified type (HCC) 2. Neurocognitive disorder Although she continues to have occasional behavior disturbances of becoming loud, and sundowning episode , it has been occurring in the context of new roommate at the group home.  Will continue current dose of olanzapine to target  schizophrenia/neuropsychiatric symptoms. The staff was advised to continue to work on behavioral modification/maintaining a routine as much as possible. Noted that although she used to be on Depakote, which was discontinued a few years ago due  to concern of drowsiness.   3. Insomnia, unspecified type She continues to have occasional sundowning episode.  Will continue melatonin for this.  Noted that she was also on Ambien, by her PCP.    Plan Continue olanzapine 20 mg at night (- pharmacy will contact us if she needs refills Continue melatonin 3 mg at night, 2 hours before going to bed Next appointment: 12/7 at 11:30 for 30 mins, video Maria Pace. [phone]) (She is on Ambien 5 mg)    The patient demonstrates the following risk factors for suicide: Chronic risk factors for suicide include: psychiatric disorder of schizophrenia. Acute risk factors for suicide include: unemployment. Protective factors for this patient include: positive social support. Considering these factors, the overall suicide risk at this point appears to be low. Patient is appropriate for outpatient follow up.  This clinician has discussed the side effect associated with medication prescribed during this encounter. Please refer to notes in the previous encounters for more details.       Collaboration of Care: Collaboration of Care: Other N/A  Patient/Guardian was advised Release of Information must be obtained prior to any record release in order to collaborate their care with an outside provider. Patient/Guardian was advised if they have not already done so to contact the registration department to sign all necessary forms in order for [place] to release information regarding their care.   Consent: Patient/Guardian gives verbal consent for treatment and assignment of benefits for services provided during this visit. Patient/Guardian expressed understanding and agreed to proceed.    [name], MD 12/02/2021, 12:29 PM

## 2021-12-02 ENCOUNTER — Telehealth (INDEPENDENT_AMBULATORY_CARE_PROVIDER_SITE_OTHER): Payer: Medicare (Managed Care) | Admitting: Psychiatry

## 2021-12-02 ENCOUNTER — Encounter: Payer: Self-pay | Admitting: Psychiatry

## 2021-12-02 DIAGNOSIS — F209 Schizophrenia, unspecified: Secondary | ICD-10-CM

## 2021-12-02 DIAGNOSIS — G4700 Insomnia, unspecified: Secondary | ICD-10-CM | POA: Diagnosis not present

## 2021-12-02 DIAGNOSIS — R419 Unspecified symptoms and signs involving cognitive functions and awareness: Secondary | ICD-10-CM

## 2021-12-02 MED ORDER — OLANZAPINE 20 MG PO TABS: 20.0000 mg | ORAL_TABLET | Freq: Every day | ORAL | 2 refills | Status: DC

## 2021-12-16 ENCOUNTER — Telehealth: Payer: Self-pay

## 2021-12-16 NOTE — Telephone Encounter (Signed)
Please let me know once you get more information .

## 2021-12-16 NOTE — Telephone Encounter (Signed)
Ivin Booty called to report that patients behavioral is out of control called to get more info no answer at either number left voicemail to return call to office.

## 2021-12-22 ENCOUNTER — Telehealth: Payer: Self-pay | Admitting: Psychiatry

## 2021-12-22 NOTE — Telephone Encounter (Signed)
Resident Home, med tech, [name], calling per her Administrator, concerned about behavior of Maria Pace. She had called 12-16-21 and left message, Ivin Booty states she is no better. Still not sleeping, talking out of her head she states. Waiting on call from physician for results from labs concerned about a UTI.  Please call and advise.

## 2021-12-22 NOTE — Telephone Encounter (Signed)
Discussed with s. ThomasControl and instrumentation engineer.  She states that Ms. Burnley had "melt down" at doctor's office. She is refusing showering, hearing jesus.  There is no safety concern to others or herself.  The staff is wondering if she may have a UTI.  Although she was seen by her provider last week, she was reportedly checked blood test only.  Discussed the following.  -Contact her PCP to rule out any medical condition contributing to her mental state, including UTI - Next appointment 10/6 at 9:30, video.

## 2021-12-29 NOTE — Progress Notes (Signed)
Virtual Visit via Video Note  I connected with Maria Pace on 12/31/21 at  9:30 AM EDT by a video enabled telemedicine application and verified that I am speaking with the correct person using two identifiers.  Location: Patient: group home Provider: office Persons participated in the visit- patient, provider    I discussed the limitations of evaluation and management by telemedicine and the availability of in person appointments. The patient expressed understanding and agreed to proceed.     I discussed the assessment and treatment plan with the patient. The patient was provided an opportunity to ask questions and all were answered. The patient agreed with the plan and demonstrated an understanding of the instructions.   The patient was advised to call back or seek an in-person evaluation if the symptoms worsen or if the condition fails to improve as anticipated.  I provided 21 minutes of non-face-to-face time during this encounter.   Norman Clay, MD    [facility] MD/PA/NP OP Progress Note  12/31/2021 10:09 AM Maria Pace  MRN:  [id number]  Chief Complaint:  Chief Complaint  Patient presents with   Follow-up   Schizophrenia   HPI:  This is a follow-up appointment for schizophrenia and neurocognitive disorder.  This appointment was made sooner due to concern from the staff.   Maria Pace and other staff at the group home presents to the interview first due to Maria Pace becoming frustrated when they were talking about her at the last visit.  Maria Pace has not been sleeping at night.  She appears to be afraid to sleep.  She does not trust Maria Pace anymore. Maria Pace thinks that somebody is trying to do something to her. She hears helicopters sound.  She states that Jesus get to see the police, although there were nobody outside.  She tends to be irritable and push her walker against the door, though there has been no aggression to people. Maria Pace thinks that she is different  from how she used to be.  She appears to like her roommate.  She takes medication regularly.  She eats well.  She has not been seen by her PCP since September.  Urinalysis has not been done yet.  Maria Pace agrees to contact her PCP for follow up visit.   Maria Pace presents to the visit. When she was initially asked her name for verification, she becomes argumentative with the staff. She states that she does not have last name. She states that she has been doing okay. She gets along well with other staff. Although she initially states that she sleeps good at night, she later states that she is awake at night. She hears voice of "God, father and son, but not the evil thing. " She denies SI, HI.    Visit Diagnosis:    ICD-10-CM   1. Schizophrenia, unspecified type ([place])  F20.9 Urinalysis    2. Neurocognitive disorder  R41.9     3. Insomnia, unspecified type  G47.00       Past Psychiatric History: Please see initial evaluation for full details. I have reviewed the history. No updates at this time.     Past Medical History:  Past Medical History:  Diagnosis Date   Bipolar 1 disorder ([place])    Essential hypertension    GERD (gastroesophageal reflux disease)    Hyperlipidemia    Irregular heart beat    Schizophrenia (HCC)    Type 2 diabetes mellitus ([place])     Past Surgical History:  Procedure Laterality Date   YAG LASER APPLICATION Left 01/06/2015   Procedure: YAG LASER APPLICATION;  Surgeon: Jethro Bolus, MD;  Location: [facility];  Service: Ophthalmology;  Laterality: Left;    Family Psychiatric History: Please see initial evaluation for full details. I have reviewed the history. No updates at this time.     Family History:  Family History  Problem Relation Age of Onset   Hypertension Father    Diabetes type II Father     Social History:  Social History   Socioeconomic History   Marital status: Single    Spouse name: Not on file   Number of children: Not on file   Years  of education: Not on file   Highest education level: Not on file  Occupational History   Not on file  Tobacco Use   Smoking status: Never   Smokeless tobacco: Never  Vaping Use   Vaping Use: Never used  Substance and Sexual Activity   Alcohol use: No   Drug use: No   Sexual activity: Never  Other Topics Concern   Not on file  Social History Narrative   Not on file   Social Determinants of Health   Financial Resource Strain: Not on file  Food Insecurity: Not on file  Transportation Needs: Not on file  Physical Activity: Not on file  Stress: Not on file  Social Connections: Not on file    Allergies:  Allergies  Allergen Reactions   Haloperidol Lactate Other (See Comments)    Couldn't urinate anymore    Metabolic Disorder Labs: Lab Results  Component Value Date   HGBA1C 6.3 (H) 10/03/2019   MPG 134.11 10/03/2019   No results found for: "PROLACTIN" Lab Results  Component Value Date   CHOL 168 10/10/2019   TRIG 54 10/10/2019   HDL 37 (L) 10/10/2019   CHOLHDL 4.5 10/10/2019   VLDL 11 10/10/2019   LDLCALC 120 (H) 10/10/2019   LDLCALC 99 10/06/2019   Lab Results  Component Value Date   TSH 1.481 10/06/2019   TSH 2.634 05/18/2017    Therapeutic Level Labs: No results found for: "LITHIUM" Lab Results  Component Value Date   VALPROATE 64 06/13/2018   VALPROATE 86.5 02/15/2018   No results found for: "CBMZ"  Current Medications: Current Outpatient Medications  Medication Sig Dispense Refill   OLANZapine (ZYPREXA) 2.5 MG tablet Take 1 tablet (2.5 mg total) by mouth at bedtime. Total of 22.5 mg at night. Take along with 20 mg tab 30 tablet 0   albuterol (VENTOLIN HFA) 108 (90 Base) MCG/ACT inhaler Inhale into the lungs.     apixaban (ELIQUIS) 5 MG TABS tablet Take 1 tablet (5 mg total) by mouth 2 (two) times daily. 60 tablet    atorvastatin (LIPITOR) 20 MG tablet Take 20 mg by mouth daily.     Camphor-Eucalyptus-Menthol (VICKS VAPORUB) 4.7-1.2-2.6 % OINT  Apply 1 application topically daily. Apply once to toenails daily     cholecalciferol (VITAMIN D3) 10 MCG (400 UNIT) TABS tablet Take 400 Units by mouth.     docusate sodium (COLACE) 100 MG capsule Take 200 mg by mouth daily.     FEROSUL 325 (65 Fe) MG tablet Take by mouth.     furosemide (LASIX) 20 MG tablet Take 20 mg by mouth.     gabapentin (NEURONTIN) 400 MG capsule Take 400 mg by mouth at bedtime.     latanoprost (XALATAN) 0.005 % ophthalmic solution Place 1 drop into both eyes at bedtime.  lisinopril (ZESTRIL) 2.5 MG tablet Take 1 tablet (2.5 mg total) by mouth daily.     loratadine (CLARITIN) 10 MG tablet Take 10 mg by mouth daily.     metFORMIN (GLUCOPHAGE) 500 MG tablet Take 500 mg by mouth 2 (two) times daily with a meal.     metoprolol tartrate (LOPRESSOR) 25 MG tablet Take 1 tablet (25 mg total) by mouth 2 (two) times daily.     Multiple Vitamin (MULTIVITAMIN) tablet Take 1 tablet by mouth daily.     OLANZapine (ZYPREXA) 20 MG tablet Take 1 tablet (20 mg total) by mouth at bedtime. 30 tablet 2   pantoprazole (PROTONIX) 40 MG tablet Take 40 mg by mouth daily.     polyethylene glycol powder (GLYCOLAX/MIRALAX) powder Take 17 g by mouth daily as needed for mild constipation or moderate constipation.     senna-docusate (SENOKOT-S) 8.6-50 MG tablet Take 2 tablets by mouth at bedtime. 60 tablet 0   vitamin B-12 (CYANOCOBALAMIN) 500 MCG tablet Take 1 tablet (500 mcg total) by mouth daily.     zolpidem (AMBIEN) 5 MG tablet Take 5 mg by mouth at bedtime as needed for sleep.     No current facility-administered medications for this visit.     Musculoskeletal: Strength & Muscle Tone:  N/A Gait & Station:  N/A Patient leans: N/A  Psychiatric Specialty Exam: Review of Systems  Psychiatric/Behavioral:  Positive for hallucinations and sleep disturbance. Negative for agitation, behavioral problems, confusion, decreased concentration, dysphoric mood, self-injury and suicidal ideas. The  patient is not nervous/anxious and is not hyperactive.   All other systems reviewed and are negative.   There were no vitals taken for this visit.There is no height or weight on file to calculate BMI.  General Appearance: Fairly Groomed  Eye Contact:  Good  Speech:  Clear and Coherent  Volume:  Normal  Mood:   good  Affect:  Appropriate, Congruent, and Restricted  Thought Process:  Disorganized  Orientation:  Full (Time, Place, and Person) except her name  Thought Content: Hallucinations: Auditory and Paranoid Ideation   Suicidal Thoughts:  No  Homicidal Thoughts:  No  Memory:  Immediate;   Fair  Judgement:  Impaired  Insight:  Lacking  Psychomotor Activity:  Normal  Concentration:  Concentration: Good and Attention Span: Good  Recall:  Poor  Fund of Knowledge: Good  Language: Good  Akathisia:  No  Handed:  Right  AIMS (if indicated): not done  Assets:  Communication Skills Desire for Improvement  ADL's:  Intact  Cognition: WNL  Sleep:  Poor   Screenings: PHQ2-9    Flowsheet Row Office Visit from 09/06/2021 in [facility] Office Visit from 10/12/2020 in [facility] Video Visit from 07/10/2020 in [facility]  PHQ-2 Total Score 3 0 1  PHQ-9 Total Score 14 -- --      Flowsheet Row Office Visit from 09/06/2021 in [facility] Office Visit from 10/12/2020 in [facility] Video Visit from 07/10/2020 in [facility]  C-SSRS RISK CATEGORY Error: Q3, 4, or 5 should not be populated when Q2 is No No Risk No Risk        Assessment and Plan:  MANA HABERL is a 81 y.o. year old female with a history of schizophrenia, neurocognitive disorder, hypertension, type II diabetes who presents for follow up appointment for below.    1. Schizophrenia, unspecified type (HCC) 2. Neurocognitive disorder 3. Insomnia,  unspecified  type Exam is notable for disorganization, and the staff reports worsening in sundowning episode, hallucinations over the past few weeks, which is a significant change from her baseline.  There has been a change in the environment, which includes having a new roommate at the group home.  The staff was advised for the patient to be evaluated by PCP again to rule out any medical health issues.  They were advised to send the blood test result.  Will obtain urinalysis given it has not been done.  Will uptitrate olanzapine in the meantime to target schizophrenia/neuropsychiatric symptoms.  Discussed potential risk of drowsiness.  Although there is an increased mortality from this medication, the benefit of treating her current condition outweighs the risk.  The staff was advised to continue to work on behavior modification as much as possible. Noted that although she used to be on Depakote, which was discontinued a few years ago due to concern of drowsiness.    Plan Increase olanzapine 22.5 mg at night (- pharmacy will contact us if she needs refills Discontinue melatonin Next appointment: 11/3 at 11 30, 12/7 at 11:30 for 30 mins, video Maria Pace. [phone]) -  (She is on Ambien 5 mg)    The patient demonstrates the following risk factors for suicide: Chronic risk factors for suicide include: psychiatric disorder of schizophrenia. Acute risk factors for suicide include: unemployment. Protective factors for this patient include: positive social support. Considering these factors, the overall suicide risk at this point appears to be low. Patient is appropriate for outpatient follow up.        Collaboration of Care: Collaboration of Care: Other reviewed notes in Epic  Patient/Guardian was advised Release of Information must be obtained prior to any record release in order to collaborate their care with an outside provider. Patient/Guardian was advised if they have not already done so to contact  the registration department to sign all necessary forms in order for [place] to release information regarding their care.   Consent: Patient/Guardian gives verbal consent for treatment and assignment of benefits for services provided during this visit. Patient/Guardian expressed understanding and agreed to proceed.    [name], MD 12/31/2021, 10:09 AM

## 2021-12-31 ENCOUNTER — Encounter: Payer: Self-pay | Admitting: Psychiatry

## 2021-12-31 ENCOUNTER — Telehealth (INDEPENDENT_AMBULATORY_CARE_PROVIDER_SITE_OTHER): Payer: Medicare (Managed Care) | Admitting: Psychiatry

## 2021-12-31 DIAGNOSIS — F209 Schizophrenia, unspecified: Secondary | ICD-10-CM | POA: Diagnosis not present

## 2021-12-31 DIAGNOSIS — G4700 Insomnia, unspecified: Secondary | ICD-10-CM

## 2021-12-31 DIAGNOSIS — R419 Unspecified symptoms and signs involving cognitive functions and awareness: Secondary | ICD-10-CM

## 2021-12-31 MED ORDER — OLANZAPINE 2.5 MG PO TABS: 2.5000 mg | ORAL_TABLET | Freq: Every day | ORAL | 0 refills | Status: DC

## 2021-12-31 NOTE — Patient Instructions (Signed)
Increase olanzapine 22.5 mg at night  Discontinue melatonin Next appointment: 11/3 at 36 30, video

## 2022-01-03 ENCOUNTER — Telehealth: Payer: Self-pay | Admitting: Psychiatry

## 2022-01-03 NOTE — Telephone Encounter (Signed)
Reviewed labs, ordered in 11/2021 by Dr. Legrand Rams.   WBC 11, Hb 10.6 (10.1 in 05/2021), ANC 8151,  BMP BUN 45, Cre 1.20 (1.23 in 05/2021), otherwise wnl  No result of u/a in Sept 2023.

## 2022-01-07 ENCOUNTER — Telehealth: Payer: Self-pay | Admitting: Psychiatry

## 2022-01-07 LAB — URINALYSIS
Bilirubin, UA: NEGATIVE
Glucose, UA: NEGATIVE
Ketones, UA: NEGATIVE
Leukocytes,UA: NEGATIVE
Nitrite, UA: NEGATIVE
Protein,UA: NEGATIVE
RBC, UA: NEGATIVE
Specific Gravity, UA: 1.01 (ref 1.005–1.030)
Urobilinogen, Ur: 0.2 mg/dL (ref 0.2–1.0)
pH, UA: 6.5 (ref 5.0–7.5)

## 2022-01-07 NOTE — Telephone Encounter (Signed)
UA - result reviewed 01/06/2022- normal

## 2022-01-07 NOTE — Telephone Encounter (Signed)
tried both phone numbers they both have been disconnected.

## 2022-01-10 NOTE — Progress Notes (Signed)
Please notify the facility that urine tests are within normal range.

## 2022-01-17 ENCOUNTER — Telehealth: Payer: Self-pay | Admitting: Psychiatry

## 2022-01-17 NOTE — Telephone Encounter (Signed)
Maria Pace from care home called wanting results of test. I gave to her, also updated phone numbers, stated she will wait for virtual appointment to discuss her moods still the same.

## 2022-01-27 ENCOUNTER — Telehealth: Payer: Self-pay | Admitting: Psychiatry

## 2022-01-27 NOTE — Progress Notes (Signed)
Virtual Visit via Video Note  I connected with Maria Pace on 01/28/22 at 11:30 AM EDT by a video enabled telemedicine application and verified that I am speaking with the correct person using two identifiers.  Location: Patient: home Provider: office Persons participated in the visit- patient, provider    I discussed the limitations of evaluation and management by telemedicine and the availability of in person appointments. The patient expressed understanding and agreed to proceed.    I discussed the assessment and treatment plan with the patient. The patient was provided an opportunity to ask questions and all were answered. The patient agreed with the plan and demonstrated an understanding of the instructions.   The patient was advised to call back or seek an in-person evaluation if the symptoms worsen or if the condition fails to improve as anticipated.  I provided 20 minutes of non-face-to-face time during this encounter.   [name], MD    [facility] MD/PA/NP OP Progress Note  01/28/2022 12:16 PM Maria Pace  MRN:  [id number]  Chief Complaint:  Chief Complaint  Patient presents with   Follow-up   HPI:  This is a follow-up appointment for schizoaffective disorder and neurocognitive disorder.  Berdella states that she has been doing fine.  She denies any issues with her roommate.  She has been doing crochet.  Things are "just fine."  She denies any issues with the staffs or other residents.  She has occasional insomnia.  She denies feeling depressed or anxiety.  She denies SI, HI, hallucinations.   Maria Pace, the staff presents to the interview.  Salisa tried to awake at the lady in the middle of the night.  She appears to be not sleeping well at times.  She is also accusing others of stealing.  She tends to put stuff away, and does not remember this.  She goes back and apologized after she is informed.  There has been at least a few occasions of her insisting to leave when she  does not get things right away as she wishes. Maria Pace think she is manipulating.  She will not be able to physically leave, and there is no safety concerns around her or others. She takes medication regularly, and eats well.     Visit Diagnosis:    ICD-10-CM   1. Schizophrenia, unspecified type (HCC)  F20.9     2. Neurocognitive disorder  R41.9     3. Insomnia, unspecified type  G47.00       Past Psychiatric History: Please see initial evaluation for full details. I have reviewed the history. No updates at this time.     Past Medical History:  Past Medical History:  Diagnosis Date   Bipolar 1 disorder (HCC)    Essential hypertension    GERD (gastroesophageal reflux disease)    Hyperlipidemia    Irregular heart beat    Schizophrenia (HCC)    Type 2 diabetes mellitus (HCC)     Past Surgical History:  Procedure Laterality Date   YAG LASER APPLICATION Left 01/06/2015   Procedure: YAG LASER APPLICATION;  Surgeon: Jethro Bolus, MD;  Location: [facility];  Service: Ophthalmology;  Laterality: Left;    Family Psychiatric History: Please see initial evaluation for full details. I have reviewed the history. No updates at this time.     Family History:  Family History  Problem Relation Age of Onset   Hypertension Father    Diabetes type II Father     Social History:  Social History  Socioeconomic History   Marital status: Single    Spouse name: Not on file   Number of children: Not on file   Years of education: Not on file   Highest education level: Not on file  Occupational History   Not on file  Tobacco Use   Smoking status: Never   Smokeless tobacco: Never  Vaping Use   Vaping Use: Never used  Substance and Sexual Activity   Alcohol use: No   Drug use: No   Sexual activity: Never  Other Topics Concern   Not on file  Social History Narrative   Not on file   Social Determinants of Health   Financial Resource Strain: Not on file  Food Insecurity: Not on file   Transportation Needs: Not on file  Physical Activity: Not on file  Stress: Not on file  Social Connections: Not on file    Allergies:  Allergies  Allergen Reactions   Haloperidol Lactate Other (See Comments)    Couldn't urinate anymore    Metabolic Disorder Labs: Lab Results  Component Value Date   HGBA1C 6.3 (H) 10/03/2019   MPG 134.11 10/03/2019   No results found for: "PROLACTIN" Lab Results  Component Value Date   CHOL 168 10/10/2019   TRIG 54 10/10/2019   HDL 37 (L) 10/10/2019   CHOLHDL 4.5 10/10/2019   VLDL 11 10/10/2019   LDLCALC 120 (H) 10/10/2019   LDLCALC 99 10/06/2019   Lab Results  Component Value Date   TSH 1.481 10/06/2019   TSH 2.634 05/18/2017    Therapeutic Level Labs: No results found for: "LITHIUM" Lab Results  Component Value Date   VALPROATE 64 06/13/2018   VALPROATE 86.5 02/15/2018   No results found for: "CBMZ"  Current Medications: Current Outpatient Medications  Medication Sig Dispense Refill   albuterol (VENTOLIN HFA) 108 (90 Base) MCG/ACT inhaler Inhale into the lungs.     apixaban (ELIQUIS) 5 MG TABS tablet Take 1 tablet (5 mg total) by mouth 2 (two) times daily. 60 tablet    atorvastatin (LIPITOR) 20 MG tablet Take 20 mg by mouth daily.     Camphor-Eucalyptus-Menthol (VICKS VAPORUB) 4.7-1.2-2.6 % OINT Apply 1 application topically daily. Apply once to toenails daily     cholecalciferol (VITAMIN D3) 10 MCG (400 UNIT) TABS tablet Take 400 Units by mouth.     docusate sodium (COLACE) 100 MG capsule Take 200 mg by mouth daily.     FEROSUL 325 (65 Fe) MG tablet Take by mouth.     furosemide (LASIX) 20 MG tablet Take 20 mg by mouth.     gabapentin (NEURONTIN) 400 MG capsule Take 400 mg by mouth at bedtime.     latanoprost (XALATAN) 0.005 % ophthalmic solution Place 1 drop into both eyes at bedtime.     lisinopril (ZESTRIL) 2.5 MG tablet Take 1 tablet (2.5 mg total) by mouth daily.     loratadine (CLARITIN) 10 MG tablet Take 10 mg by  mouth daily.     metFORMIN (GLUCOPHAGE) 500 MG tablet Take 500 mg by mouth 2 (two) times daily with a meal.     metoprolol tartrate (LOPRESSOR) 25 MG tablet Take 1 tablet (25 mg total) by mouth 2 (two) times daily.     Multiple Vitamin (MULTIVITAMIN) tablet Take 1 tablet by mouth daily.     OLANZapine (ZYPREXA) 20 MG tablet Take 1 tablet (20 mg total) by mouth at bedtime. 30 tablet 2   pantoprazole (PROTONIX) 40 MG tablet Take 40 mg by  mouth daily.     polyethylene glycol powder (GLYCOLAX/MIRALAX) powder Take 17 g by mouth daily as needed for mild constipation or moderate constipation.     senna-docusate (SENOKOT-S) 8.6-50 MG tablet Take 2 tablets by mouth at bedtime. 60 tablet 0   vitamin B-12 (CYANOCOBALAMIN) 500 MCG tablet Take 1 tablet (500 mcg total) by mouth daily.     zolpidem (AMBIEN) 5 MG tablet Take 5 mg by mouth at bedtime as needed for sleep.     No current facility-administered medications for this visit.     Musculoskeletal: Strength & Muscle Tone:  N/A Gait & Station:  N/A Patient leans: N/A  Psychiatric Specialty Exam: Review of Systems  Psychiatric/Behavioral:  Positive for behavioral problems, hallucinations and sleep disturbance. Negative for agitation, confusion, decreased concentration, dysphoric mood, self-injury and suicidal ideas. The patient is not nervous/anxious and is not hyperactive.   All other systems reviewed and are negative.   There were no vitals taken for this visit.There is no height or weight on file to calculate BMI.  General Appearance: Fairly Groomed  Eye Contact:  Good  Speech:  Clear and Coherent  Volume:  Normal  Mood:   good  Affect:  Appropriate and Congruent  Thought Process:  Coherent  Orientation:  Full (Time, Place, and Person)  Thought Content: Logical   Suicidal Thoughts:  No  Homicidal Thoughts:  No  Memory:  Immediate;   Good  Judgement:  Fair  Insight:  Shallow  Psychomotor Activity:  Normal  Concentration:   Concentration: Good and Attention Span: Good  Recall:  Poor  Fund of Knowledge: Good  Language: Good  Akathisia:  No  Handed:  Right  AIMS (if indicated): not done  Assets:  Communication Skills Desire for Improvement  ADL's:  Intact  Cognition: Impaired,  Moderate  Sleep:  Poor   Screenings: PHQ2-9    Flowsheet Row Office Visit from 09/06/2021 in [facility] Office Visit from 10/12/2020 in [facility] Video Visit from 07/10/2020 in [facility]  PHQ-2 Total Score 3 0 1  PHQ-9 Total Score 14 -- --      Flowsheet Row Office Visit from 09/06/2021 in [facility] Office Visit from 10/12/2020 in [facility] Video Visit from 07/10/2020 in [facility]  C-SSRS RISK CATEGORY Error: Q3, 4, or 5 should not be populated when Q2 is No No Risk No Risk        Assessment and Plan:  ALAURA SCHIPPERS is a 81 y.o. year old female with a history of schizophrenia, neurocognitive disorder, hypertension, type II diabetes, who presents for follow up appointment for below.    1. Schizophrenia, unspecified type (HCC) 2. Neurocognitive disorder 3. Insomnia, unspecified type Has been overall improvement in his organization, sundowning episode, although she continues to demonstrate some behavior issues at group home along with mild sundowning episode.  This recent exacerbation occurred in the context of changing the environment of having a new roommate, although she denies any concern about this.  She was seen by her PCP; the staff agrees to fax the note to the office for review.  UTI is ruled out based on the urinalysis.  Will do further uptitration of olanzapine to target behavior issues, insomnia.  Discussed potential metabolic side effect, EPS, drowsiness.  It is also discussed regarding increased mortality for people with dementia. The staff  was advised again to continue to work on behavioral modification. Noted that although  she used to be on Depakote, which was discontinued a few years ago due to concern of drowsiness.    Plan Increase olanzapine 25 mg at night  Next appointment: 12/7 at 11:30, and 1/3 at 10 AM for 30 mins, video Maria Pace. [phone]) The staff will fax the PCP note to the office  (She is on Ambien 5 mg)    The patient demonstrates the following risk factors for suicide: Chronic risk factors for suicide include: psychiatric disorder of schizophrenia. Acute risk factors for suicide include: unemployment. Protective factors for this patient include: positive social support. Considering these factors, the overall suicide risk at this point appears to be low. Patient is appropriate for outpatient follow up.          Collaboration of Care: Collaboration of Care: Other reviewed notes in Epic  Patient/Guardian was advised Release of Information must be obtained prior to any record release in order to collaborate their care with an outside provider. Patient/Guardian was advised if they have not already done so to contact the registration department to sign all necessary forms in order for [place] to release information regarding their care.   Consent: Patient/Guardian gives verbal consent for treatment and assignment of benefits for services provided during this visit. Patient/Guardian expressed understanding and agreed to proceed.    [name], MD 01/28/2022, 12:16 PM

## 2022-01-28 ENCOUNTER — Telehealth (INDEPENDENT_AMBULATORY_CARE_PROVIDER_SITE_OTHER): Payer: Medicare (Managed Care) | Admitting: Psychiatry

## 2022-01-28 ENCOUNTER — Encounter: Payer: Self-pay | Admitting: Psychiatry

## 2022-01-28 DIAGNOSIS — R419 Unspecified symptoms and signs involving cognitive functions and awareness: Secondary | ICD-10-CM

## 2022-01-28 DIAGNOSIS — F209 Schizophrenia, unspecified: Secondary | ICD-10-CM

## 2022-01-28 DIAGNOSIS — G4700 Insomnia, unspecified: Secondary | ICD-10-CM

## 2022-01-28 MED ORDER — OLANZAPINE 5 MG PO TABS: 5.0000 mg | ORAL_TABLET | Freq: Every day | ORAL | 1 refills | Status: DC

## 2022-01-28 NOTE — Patient Instructions (Signed)
  Increase olanzapine 25 mg at night  Next appointment: 12/7 at 11:30, and 1/3 at 10 AM

## 2022-01-31 NOTE — Telephone Encounter (Signed)
Called care home to change virtual appointment to in person. Ivin Booty stated that would be fine. Also wanted Dr to know she started the 5 mg on Friday, so total of 25 mg of what she was already on. Ivin Booty could not recollect name of the medication when I asked.

## 2022-01-31 NOTE — Telephone Encounter (Signed)
Noted, thanks!

## 2022-03-01 NOTE — Progress Notes (Deleted)
BH MD/PA/NP OP Progress Note  03/01/2022 10:21 AM Maria Pace  MRN:  [id number]  Chief Complaint: No chief complaint on file.  HPI: *** Visit Diagnosis: No diagnosis found.  Past Psychiatric History: Please see initial evaluation for full details. I have reviewed the history. No updates at this time.     Past Medical History:  Past Medical History:  Diagnosis Date   Bipolar 1 disorder (HCC)    Essential hypertension    GERD (gastroesophageal reflux disease)    Hyperlipidemia    Irregular heart beat    Schizophrenia (HCC)    Type 2 diabetes mellitus (HCC)     Past Surgical History:  Procedure Laterality Date   YAG LASER APPLICATION Left 01/06/2015   Procedure: YAG LASER APPLICATION;  Surgeon: Jethro Bolus, MD;  Location: [facility];  Service: Ophthalmology;  Laterality: Left;    Family Psychiatric History: Please see initial evaluation for full details. I have reviewed the history. No updates at this time.     Family History:  Family History  Problem Relation Age of Onset   Hypertension Father    Diabetes type II Father     Social History:  Social History   Socioeconomic History   Marital status: Single    Spouse name: Not on file   Number of children: Not on file   Years of education: Not on file   Highest education level: Not on file  Occupational History   Not on file  Tobacco Use   Smoking status: Never   Smokeless tobacco: Never  Vaping Use   Vaping Use: Never used  Substance and Sexual Activity   Alcohol use: No   Drug use: No   Sexual activity: Never  Other Topics Concern   Not on file  Social History Narrative   Not on file   Social Determinants of Health   Financial Resource Strain: Not on file  Food Insecurity: Not on file  Transportation Needs: Not on file  Physical Activity: Not on file  Stress: Not on file  Social Connections: Not on file    Allergies:  Allergies  Allergen Reactions   Haloperidol Lactate Other (See Comments)     Couldn't urinate anymore    Metabolic Disorder Labs: Lab Results  Component Value Date   HGBA1C 6.3 (H) 10/03/2019   MPG 134.11 10/03/2019   No results found for: "PROLACTIN" Lab Results  Component Value Date   CHOL 168 10/10/2019   TRIG 54 10/10/2019   HDL 37 (L) 10/10/2019   CHOLHDL 4.5 10/10/2019   VLDL 11 10/10/2019   LDLCALC 120 (H) 10/10/2019   LDLCALC 99 10/06/2019   Lab Results  Component Value Date   TSH 1.481 10/06/2019   TSH 2.634 05/18/2017    Therapeutic Level Labs: No results found for: "LITHIUM" Lab Results  Component Value Date   VALPROATE 64 06/13/2018   VALPROATE 86.5 02/15/2018   No results found for: "CBMZ"  Current Medications: Current Outpatient Medications  Medication Sig Dispense Refill   albuterol (VENTOLIN HFA) 108 (90 Base) MCG/ACT inhaler Inhale into the lungs.     apixaban (ELIQUIS) 5 MG TABS tablet Take 1 tablet (5 mg total) by mouth 2 (two) times daily. 60 tablet    atorvastatin (LIPITOR) 20 MG tablet Take 20 mg by mouth daily.     Camphor-Eucalyptus-Menthol (VICKS VAPORUB) 4.7-1.2-2.6 % OINT Apply 1 application topically daily. Apply once to toenails daily     cholecalciferol (VITAMIN D3) 10 MCG (400 UNIT) TABS  tablet Take 400 Units by mouth.     docusate sodium (COLACE) 100 MG capsule Take 200 mg by mouth daily.     FEROSUL 325 (65 Fe) MG tablet Take by mouth.     furosemide (LASIX) 20 MG tablet Take 20 mg by mouth.     gabapentin (NEURONTIN) 400 MG capsule Take 400 mg by mouth at bedtime.     latanoprost (XALATAN) 0.005 % ophthalmic solution Place 1 drop into both eyes at bedtime.     lisinopril (ZESTRIL) 2.5 MG tablet Take 1 tablet (2.5 mg total) by mouth daily.     loratadine (CLARITIN) 10 MG tablet Take 10 mg by mouth daily.     metFORMIN (GLUCOPHAGE) 500 MG tablet Take 500 mg by mouth 2 (two) times daily with a meal.     metoprolol tartrate (LOPRESSOR) 25 MG tablet Take 1 tablet (25 mg total) by mouth 2 (two) times daily.      Multiple Vitamin (MULTIVITAMIN) tablet Take 1 tablet by mouth daily.     OLANZapine (ZYPREXA) 20 MG tablet Take 1 tablet (20 mg total) by mouth at bedtime. 30 tablet 2   OLANZapine (ZYPREXA) 5 MG tablet Take 1 tablet (5 mg total) by mouth at bedtime. 30 tablet 1   pantoprazole (PROTONIX) 40 MG tablet Take 40 mg by mouth daily.     polyethylene glycol powder (GLYCOLAX/MIRALAX) powder Take 17 g by mouth daily as needed for mild constipation or moderate constipation.     senna-docusate (SENOKOT-S) 8.6-50 MG tablet Take 2 tablets by mouth at bedtime. 60 tablet 0   vitamin B-12 (CYANOCOBALAMIN) 500 MCG tablet Take 1 tablet (500 mcg total) by mouth daily.     zolpidem (AMBIEN) 5 MG tablet Take 5 mg by mouth at bedtime as needed for sleep.     No current facility-administered medications for this visit.     Musculoskeletal: Strength & Muscle Tone: within normal limits Gait & Station: normal Patient leans: N/A  Psychiatric Specialty Exam: Review of Systems  There were no vitals taken for this visit.There is no height or weight on file to calculate BMI.  General Appearance: {Appearance:22683}  Eye Contact:  {BHH EYE CONTACT:22684}  Speech:  Clear and Coherent  Volume:  Normal  Mood:  {BHH MOOD:22306}  Affect:  {Affect (PAA):22687}  Thought Process:  Coherent  Orientation:  Full (Time, Place, and Person)  Thought Content: Logical   Suicidal Thoughts:  {ST/HT (PAA):22692}  Homicidal Thoughts:  {ST/HT (PAA):22692}  Memory:  Immediate;   Good  Judgement:  {Judgement (PAA):22694}  Insight:  {Insight (PAA):22695}  Psychomotor Activity:  Normal  Concentration:  Concentration: Good and Attention Span: Good  Recall:  Good  Fund of Knowledge: Good  Language: Good  Akathisia:  No  Handed:  Right  AIMS (if indicated): not done  Assets:  Communication Skills Desire for Improvement  ADL's:  Intact  Cognition: WNL  Sleep:  {B[facility] GOOD/FAIR/POOR:22877}   Screenings: PHQ2-9    Flowsheet Row  Office Visit from 09/06/2021 in [facility] Office Visit from 10/12/2020 in [facility] Video Visit from 07/10/2020 in [facility]  PHQ-2 Total Score 3 0 1  PHQ-9 Total Score 14 -- --      Flowsheet Row Office Visit from 09/06/2021 in [facility] Office Visit from 10/12/2020 in [facility] Video Visit from 07/10/2020 in [facility]  C-SSRS RISK CATEGORY Error: Q3, 4, or 5 should not be populated  when Q2 is No No Risk No Risk        Assessment and Plan:  Maria Pace is a 81 y.o. year old female with a history of schizophrenia, neurocognitive disorder, hypertension, type II diabetes, who presents for follow up appointment for below.   1. Schizophrenia, unspecified type (HCC) 2. Neurocognitive disorder 3. Insomnia, unspecified type Has been overall improvement in his organization, sundowning episode, although she continues to demonstrate some behavior issues at group home along with mild sundowning episode.  This recent exacerbation occurred in the context of changing the environment of having a new roommate, although she denies any concern about this.  She was seen by her PCP; the staff agrees to fax the note to the office for review.  UTI is ruled out based on the urinalysis.  Will do further uptitration of olanzapine to target behavior issues, insomnia.  Discussed potential metabolic side effect, EPS, drowsiness.  It is also discussed regarding increased mortality for people with dementia. The staff was advised again to continue to work on behavioral modification. Noted that although she used to be on Depakote, which was discontinued a few years ago due to concern of drowsiness.    Plan Increase olanzapine 25 mg at night  Next appointment: 12/7 at 11:30, and 1/3 at 10 AM for 30 mins, video [name]. [phone]) The staff  will fax the PCP note to the office   (She is on Ambien 5 mg)    The patient demonstrates the following risk factors for suicide: Chronic risk factors for suicide include: psychiatric disorder of schizophrenia. Acute risk factors for suicide include: unemployment. Protective factors for this patient include: positive social support. Considering these factors, the overall suicide risk at this point appears to be low. Patient is appropriate for outpatient follow up.       Collaboration of Care: Collaboration of Care: {BH OP Collaboration of Care:21014065}  Patient/Guardian was advised Release of Information must be obtained prior to any record release in order to collaborate their care with an outside provider. Patient/Guardian was advised if they have not already done so to contact the registration department to sign all necessary forms in order for [place] to release information regarding their care.   Consent: Patient/Guardian gives verbal consent for treatment and assignment of benefits for services provided during this visit. Patient/Guardian expressed understanding and agreed to proceed.    [name], MD 03/01/2022, 10:21 AM

## 2022-03-03 ENCOUNTER — Ambulatory Visit: Payer: No Typology Code available for payment source | Admitting: Psychiatry

## 2022-03-03 NOTE — Progress Notes (Addendum)
BH MD/PA/NP OP Progress Note  03/07/2022 11:57 AM Maria Pace  MRN:  [id number]  Chief Complaint:  Chief Complaint  Patient presents with   Follow-up   HPI:  This is a follow-up appointment for schizophrenia, neurocognitive disorder and insomnia.  She states that she has been doing good.  She does not knit any more as she is not interested.  She had a fair Thanksgiving.  She denied seeing her family.  When her sister mentioned that her sister visited her, she states that "they are not my sister." She talks about Jesus.  She later states that she did see one of her sisters.  She reports fair relationship with her roommate, although this roommate sometimes gets on her nerves. The patient has mood symptoms as in PHQ-9/GAD-7. She denies SI, HI, hallucinations.  She occasionally feels that people are trying to get her.  She also feels tired of nursing home, although she denies any concern about the staff. (Talks about she wants to live on her own, and "Jesus is on my side.") She denies chest pain, palpitation.   Maria Pace, the staff presents to the visit.  Maria Pace has been taking care of Maria Pace since around [employer] that Maria Pace does not sleep at night.  She hears her talking by herself.  She thinks Maria Pace has been doing well with the roommate.  Although Maria Pace makes a comment such as "you don't like me," she is amenable to the help when offered. There was an incident of her accusing other roommate, stating that this person hid her socks.  She was able to find it later.  Maria Pace denies any aggression or safety concerns.  She takes medication regularly, and eats well.    Functional Status Instrumental Activities of Daily Living (IADLs):  Maria Pace is independent in the following:  Requires assistance with the following: managing finances, medications, driving  Activities of Daily Living (ADLs):  Maria Pace is independent in the following:  feeding, continence, walking  (walker), grooming Requires assistance with the following: bathing,  toileting (occasionally have incontinence)     Wt Readings from Last 3 Encounters:  03/07/22 194 lb (88 kg)  09/06/21 194 lb 3.2 oz (88.1 kg)  10/07/19 194 lb 10.7 oz (88.3 kg)    Visit Diagnosis:    ICD-10-CM   1. Schizophrenia, unspecified type (HCC)  F20.9 EKG 12-Lead    2. Neurocognitive disorder  R41.9     3. Insomnia, unspecified type  G47.00       Past Psychiatric History: Please see initial evaluation for full details. I have reviewed the history. No updates at this time.     Past Medical History:  Past Medical History:  Diagnosis Date   Bipolar 1 disorder (HCC)    Essential hypertension    GERD (gastroesophageal reflux disease)    Hyperlipidemia    Irregular heart beat    Schizophrenia (HCC)    Type 2 diabetes mellitus (HCC)     Past Surgical History:  Procedure Laterality Date   YAG LASER APPLICATION Left 01/06/2015   Procedure: YAG LASER APPLICATION;  Surgeon: Jethro Bolus, MD;  Location: [facility];  Service: Ophthalmology;  Laterality: Left;    Family Psychiatric History: Please see initial evaluation for full details. I have reviewed the history. No updates at this time.     Family History:  Family History  Problem Relation Age of Onset   Hypertension Father    Diabetes type II Father     Social  History:  Social History   Socioeconomic History   Marital status: Single    Spouse name: Not on file   Number of children: Not on file   Years of education: Not on file   Highest education level: Not on file  Occupational History   Not on file  Tobacco Use   Smoking status: Never   Smokeless tobacco: Never  Vaping Use   Vaping Use: Never used  Substance and Sexual Activity   Alcohol use: No   Drug use: No   Sexual activity: Never  Other Topics Concern   Not on file  Social History Narrative   Not on file   Social Determinants of Health   Financial Resource Strain: Not on  file  Food Insecurity: Not on file  Transportation Needs: Not on file  Physical Activity: Not on file  Stress: Not on file  Social Connections: Not on file    Allergies:  Allergies  Allergen Reactions   Haloperidol Lactate Other (See Comments)    Couldn't urinate anymore    Metabolic Disorder Labs: Lab Results  Component Value Date   HGBA1C 6.3 (H) 10/03/2019   MPG 134.11 10/03/2019   No results found for: "PROLACTIN" Lab Results  Component Value Date   CHOL 168 10/10/2019   TRIG 54 10/10/2019   HDL 37 (L) 10/10/2019   CHOLHDL 4.5 10/10/2019   VLDL 11 10/10/2019   LDLCALC 120 (H) 10/10/2019   LDLCALC 99 10/06/2019   Lab Results  Component Value Date   TSH 1.481 10/06/2019   TSH 2.634 05/18/2017    Therapeutic Level Labs: No results found for: "LITHIUM" Lab Results  Component Value Date   VALPROATE 64 06/13/2018   VALPROATE 86.5 02/15/2018   No results found for: "CBMZ"  Current Medications: Current Outpatient Medications  Medication Sig Dispense Refill   albuterol (VENTOLIN HFA) 108 (90 Base) MCG/ACT inhaler Inhale into the lungs.     apixaban (ELIQUIS) 5 MG TABS tablet Take 1 tablet (5 mg total) by mouth 2 (two) times daily. 60 tablet    atorvastatin (LIPITOR) 20 MG tablet Take 20 mg by mouth daily.     Camphor-Eucalyptus-Menthol (VICKS VAPORUB) 4.7-1.2-2.6 % OINT Apply 1 application topically daily. Apply once to toenails daily     cholecalciferol (VITAMIN D3) 10 MCG (400 UNIT) TABS tablet Take 400 Units by mouth.     docusate sodium (COLACE) 100 MG capsule Take 200 mg by mouth daily.     FEROSUL 325 (65 Fe) MG tablet Take by mouth.     furosemide (LASIX) 20 MG tablet Take 20 mg by mouth.     gabapentin (NEURONTIN) 400 MG capsule Take 400 mg by mouth at bedtime.     latanoprost (XALATAN) 0.005 % ophthalmic solution Place 1 drop into both eyes at bedtime.     lisinopril (ZESTRIL) 2.5 MG tablet Take 1 tablet (2.5 mg total) by mouth daily.     loratadine  (CLARITIN) 10 MG tablet Take 10 mg by mouth daily.     metFORMIN (GLUCOPHAGE) 500 MG tablet Take 500 mg by mouth 2 (two) times daily with a meal.     metoprolol tartrate (LOPRESSOR) 25 MG tablet Take 1 tablet (25 mg total) by mouth 2 (two) times daily.     Multiple Vitamin (MULTIVITAMIN) tablet Take 1 tablet by mouth daily.     OLANZapine (ZYPREXA) 20 MG tablet Take 1 tablet (20 mg total) by mouth at bedtime. 30 tablet 2   OLANZapine (ZYPREXA) 5  MG tablet Take 1 tablet (5 mg total) by mouth at bedtime. 30 tablet 1   pantoprazole (PROTONIX) 40 MG tablet Take 40 mg by mouth daily.     polyethylene glycol powder (GLYCOLAX/MIRALAX) powder Take 17 g by mouth daily as needed for mild constipation or moderate constipation.     senna-docusate (SENOKOT-S) 8.6-50 MG tablet Take 2 tablets by mouth at bedtime. 60 tablet 0   vitamin B-12 (CYANOCOBALAMIN) 500 MCG tablet Take 1 tablet (500 mcg total) by mouth daily.     zolpidem (AMBIEN) 5 MG tablet Take 5 mg by mouth at bedtime as needed for sleep.     No current facility-administered medications for this visit.     Musculoskeletal: Strength & Muscle Tone: within normal limits Gait & Station:  (uses a walker) Patient leans: N/A  Psychiatric Specialty Exam: Review of Systems  Psychiatric/Behavioral:  Positive for dysphoric mood and sleep disturbance. Negative for behavioral problems, confusion, decreased concentration, hallucinations, self-injury and suicidal ideas. The patient is nervous/anxious. The patient is not hyperactive.   All other systems reviewed and are negative.   Blood pressure 113/74, pulse 88, temperature 98.1 F (36.7 C), temperature source Oral, height 5' (1.524 m), weight 194 lb (88 kg).Body mass index is 37.89 kg/m.  General Appearance: Fairly Groomed  Eye Contact:  Good  Speech:  Clear and Coherent  Volume:  Normal  Mood:   okay  Affect:  Appropriate, Congruent, and Restricted  Thought Process:  Irrelevant  Orientation:   Full (Time, Place, and Person) except the date  Thought Content: Rumination   Suicidal Thoughts:  No  Homicidal Thoughts:  No  Memory:  Immediate;   Poor  Judgement:  Fair  Insight:  Lacking  Psychomotor Activity:  Normal, Normal tone, no rigidity, no resting/postural tremors, no tardive dyskinesia    Concentration:  Concentration: Fair and Attention Span: Fair  Recall:  Poor  Fund of Knowledge: Fair  Language: Fair  Akathisia:  No  Handed:  Right  AIMS (if indicated): not done  Assets:  Social Support  ADL's:  Impaired  Cognition: Impaired,  Mild  Sleep:  Poor   Screenings: Equities traderHQ2-9    Flowsheet Row [facility] Video Visit [facility]/10[facility]  PHQ-2 Total Score 3 0 1  PHQ-9 Total Score 14 -- --      Flowsheet Row [facility] Office [facility]/12[facility]2020 [facility] Video Visit from 07/10/2020 in [facility]  C-SSRS RISK CATEGORY Error: Q3, 4, or 5 should not be populated when Q2 is No No Risk No Risk        Assessment and Plan:  Maria Pace is a 81 y.o. year old female with a history of schizophrenia, neurocognitive disorder, hypertension, type II diabetes, who presents for follow up appointment for below.   1. Schizophrenia, unspecified type (HCC) 3. Insomnia, unspecified type Although exam is notable for disorganized process with rumination, her caregiver denies any significant behavior issues at group home except insomnia at night, which coincided with uptitration of olanzapine.  Will continue current dose of olanzapine at this time to target schizophrenia, neuropsychiatry symptoms of paranoia, insomnia.  Discussed potential metabolic side effect, drowsiness, EPS, increased mortality  for people with dementia, and QTc prolongation.  Will obtain EKG. Noted that although she used to be on Depakote, which  was discontinued a few years ago due to concern of drowsiness.   2. Neurocognitive disorder She has neurocognitive deficits as evidenced by mini cog.  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 MRI in 2021 showed punctate acute/early subacute infarct within the right frontal lobe precentral gyrus, and mild generalized parenchymal atrophy and chronic small vessel ischemic disease.  Will not start medication for dementia given risk of polypharmacy outweighs its benefit.     Plan Continue olanzapine 25 mg at night  Obtain EKG Next appointment: 1/3 at 10 AM for 30 mins, video Maria Pace. [phone])   (She is on Ambien 5 mg)    The patient demonstrates the following risk factors for suicide: Chronic risk factors for suicide include: psychiatric disorder of schizophrenia. Acute risk factors for suicide include: unemployment. Protective factors for this patient include: positive social support. Considering these factors, the overall suicide risk at this point appears to be low. Patient is appropriate for outpatient follow up.           Collaboration of Care: Collaboration of Care: Other reviewed notes in Epic  Patient/Guardian was advised Release of Information must be obtained prior to any record release in order to collaborate their care with an outside provider. Patient/Guardian was advised if they have not already done so to contact the registration department to sign all necessary forms in order for [place] to release information regarding their care.   Consent: Patient/Guardian gives verbal consent for treatment and assignment of benefits for services provided during this visit. Patient/Guardian expressed understanding and agreed to proceed.    Maria Hotter, MD 03/07/2022, 11:57 AM

## 2022-03-07 ENCOUNTER — Encounter: Payer: Self-pay | Admitting: Psychiatry

## 2022-03-07 ENCOUNTER — Ambulatory Visit
Admission: RE | Admit: 2022-03-07 | Discharge: 2022-03-07 | Disposition: A | Discharge status: Home / Self Care | Payer: Medicare (Managed Care) | Source: Ambulatory Visit | Attending: Internal Medicine | Admitting: Internal Medicine

## 2022-03-07 ENCOUNTER — Ambulatory Visit (INDEPENDENT_AMBULATORY_CARE_PROVIDER_SITE_OTHER): Payer: No Typology Code available for payment source | Admitting: Psychiatry

## 2022-03-07 VITALS — BP 113/74 | HR 88 | Temp 98.1°F | Ht 60.0 in | Wt 194.0 lb

## 2022-03-07 DIAGNOSIS — I447 Left bundle-branch block, unspecified: Secondary | ICD-10-CM | POA: Insufficient documentation

## 2022-03-07 DIAGNOSIS — R419 Unspecified symptoms and signs involving cognitive functions and awareness: Secondary | ICD-10-CM

## 2022-03-07 DIAGNOSIS — G4700 Insomnia, unspecified: Secondary | ICD-10-CM

## 2022-03-07 DIAGNOSIS — F209 Schizophrenia, unspecified: Secondary | ICD-10-CM

## 2022-03-07 NOTE — Patient Instructions (Signed)
Continue olanzapine 25 mg at night  Obtain EKG Next appointment: 1/3 at 10 AM

## 2022-03-08 ENCOUNTER — Telehealth: Payer: No Typology Code available for payment source | Admitting: Psychiatry

## 2022-03-17 NOTE — Progress Notes (Signed)
Virtual Visit via Video Note  I connected with Maria Pace on 03/30/22 at 10:00 AM EST by a video enabled telemedicine application and verified that I am speaking with the correct person using two identifiers.  Location: Patient: home Provider: office Persons participated in the visit- patient, provider, care staff at the facility   I discussed the limitations of evaluation and management by telemedicine and the availability of in person appointments. The patient expressed understanding and agreed to proceed.   I discussed the assessment and treatment plan with the patient. The patient was provided an opportunity to ask questions and all were answered. The patient agreed with the plan and demonstrated an understanding of the instructions.   The patient was advised to call back or seek an in-person evaluation if the symptoms worsen or if the condition fails to improve as anticipated.  I provided 30 minutes of non-face-to-face time during this encounter.  The duration of the time spent on the following activities on the date of the encounter was 30 minutes.   Preparing to see the patient (e.g., review of test, records)  Obtaining and/or reviewing separately obtained history  Counseling and educating the patient/family/caregiver  Ordering medications, tests, or procedures  Documenting clinical information in the electronic or paper health record  Independently interpreting results of tests/labs and communication of results to the family or caregiver  [name], MD     [facility] MD/PA/NP OP Progress Note  03/30/2022 10:36 AM Maria Pace  MRN:  [id number]  Chief Complaint:  Chief Complaint  Patient presents with   Follow-up   HPI:  This is a follow-up appointment for schizophrenia and neurocognitive disorder.  She states that she would like to be called Maria Pace moving forward.  She has been doing crochet, and has made a whole dress.  She is hoping to make another few.  When  she was asked about Christmas, she states that it was good.  When she was asked about her siblings, she states that she is white, and not black.  She continues to state that God is her family, and Jesus is her husband.  She sleeps well.  She does not feel depressed or anxious.  She denies SI, HI, hallucinations.  She denies paranoia, and feels comfortable at the group home.   [name] at the group home presents to the visit.  She states that there has been no change lately.  It has been difficult to redirect Maria Pace. She tends to get mad  and upset when others are using something, and she wants things back.  There is no aggression. She has good appetite, and sleeps good on most of the time.  She tends to talk to herself.  There is no safety concern or wandering. [name] feels comfortable to stay on the current dose of medication.   Single, no children. She was born and grew up close to [facility]. She has six siblings Work: unemployed. used to work for [employer], 20's   Functional Status Instrumental Activities of Daily Living (IADLs):  Maria Pace is independent in the following:  Requires assistance with the following: managing finances, medications, driving  Activities of Daily Living (ADLs):  Maria Pace is independent in the following:  feeding, continence, walking (walker), grooming Requires assistance with the following: bathing,  toileting (occasionally have incontinence)     Visit Diagnosis:    ICD-10-CM   1. Schizophrenia, unspecified type (HCC)  F20.9     2. Neurocognitive disorder  R41.9       Past Psychiatric History: Please see initial evaluation for full details. I have reviewed the history. No updates at this time.     Past Medical History:  Past Medical History:  Diagnosis Date   Bipolar 1 disorder (HCC)    Essential hypertension    GERD (gastroesophageal reflux disease)    Hyperlipidemia    Irregular heart beat    Schizophrenia (HCC)    Type 2 diabetes  mellitus (HCC)     Past Surgical History:  Procedure Laterality Date   YAG LASER APPLICATION Left 01/06/2015   Procedure: YAG LASER APPLICATION;  Surgeon: Jethro BolusMark Shapiro, MD;  Location: [facility];  Service: Ophthalmology;  Laterality: Left;    Family Psychiatric History: Please see initial evaluation for full details. I have reviewed the history. No updates at this time.     Family History:  Family History  Problem Relation Age of Onset   Hypertension Father    Diabetes type II Father     Social History:  Social History   Socioeconomic History   Marital status: Single    Spouse name: Not on file   Number of children: Not on file   Years of education: Not on file   Highest education level: Not on file  Occupational History   Not on file  Tobacco Use   Smoking status: Never   Smokeless tobacco: Never  Vaping Use   Vaping Use: Never used  Substance and Sexual Activity   Alcohol use: No   Drug use: No   Sexual activity: Never  Other Topics Concern   Not on file  Social History Narrative   Not on file   Social Determinants of Health   Financial Resource Strain: Not on file  Food Insecurity: Not on file  Transportation Needs: Not on file  Physical Activity: Not on file  Stress: Not on file  Social Connections: Not on file    Allergies:  Allergies  Allergen Reactions   Haloperidol Lactate Other (See Comments)    Couldn't urinate anymore    Metabolic Disorder Labs: Lab Results  Component Value Date   HGBA1C 6.3 (H) 10/03/2019   MPG 134.11 10/03/2019   No results found for: "PROLACTIN" Lab Results  Component Value Date   CHOL 168 10/10/2019   TRIG 54 10/10/2019   HDL 37 (L) 10/10/2019   CHOLHDL 4.5 10/10/2019   VLDL 11 10/10/2019   LDLCALC 120 (H) 10/10/2019   LDLCALC 99 10/06/2019   Lab Results  Component Value Date   TSH 1.481 10/06/2019   TSH 2.634 05/18/2017    Therapeutic Level Labs: No results found for: "LITHIUM" Lab Results  Component  Value Date   VALPROATE 64 06/13/2018   VALPROATE 86.5 02/15/2018   No results found for: "CBMZ"  Current Medications: Current Outpatient Medications  Medication Sig Dispense Refill   albuterol (VENTOLIN HFA) 108 (90 Base) MCG/ACT inhaler Inhale into the lungs.     apixaban (ELIQUIS) 5 MG TABS tablet Take 1 tablet (5 mg total) by mouth 2 (two) times daily. 60 tablet    atorvastatin (LIPITOR) 20 MG tablet Take 20 mg by mouth daily.     Camphor-Eucalyptus-Menthol (VICKS VAPORUB) 4.7-1.2-2.6 % OINT Apply 1 application topically daily. Apply once to toenails daily     cholecalciferol (VITAMIN D3) 10 MCG (400 UNIT) TABS tablet Take 400 Units by mouth.     docusate sodium (COLACE) 100 MG capsule Take 200 mg by mouth daily.  FEROSUL 325 (65 Fe) MG tablet Take by mouth.     furosemide (LASIX) 20 MG tablet Take 20 mg by mouth.     gabapentin (NEURONTIN) 400 MG capsule Take 400 mg by mouth at bedtime.     latanoprost (XALATAN) 0.005 % ophthalmic solution Place 1 drop into both eyes at bedtime.     lisinopril (ZESTRIL) 2.5 MG tablet Take 1 tablet (2.5 mg total) by mouth daily.     loratadine (CLARITIN) 10 MG tablet Take 10 mg by mouth daily.     metFORMIN (GLUCOPHAGE) 500 MG tablet Take 500 mg by mouth 2 (two) times daily with a meal.     metoprolol tartrate (LOPRESSOR) 25 MG tablet Take 1 tablet (25 mg total) by mouth 2 (two) times daily.     Multiple Vitamin (MULTIVITAMIN) tablet Take 1 tablet by mouth daily.     OLANZapine (ZYPREXA) 20 MG tablet Take 1 tablet (20 mg total) by mouth at bedtime. Total of 25 mg at night. Take along with 20 mg tab 30 tablet 1   OLANZapine (ZYPREXA) 5 MG tablet Take 1 tablet (5 mg total) by mouth at bedtime. Total of 25 mg at night. Take along with 20 mg tab 30 tablet 1   pantoprazole (PROTONIX) 40 MG tablet Take 40 mg by mouth daily.     polyethylene glycol powder (GLYCOLAX/MIRALAX) powder Take 17 g by mouth daily as needed for mild constipation or moderate  constipation.     senna-docusate (SENOKOT-S) 8.6-50 MG tablet Take 2 tablets by mouth at bedtime. 60 tablet 0   vitamin B-12 (CYANOCOBALAMIN) 500 MCG tablet Take 1 tablet (500 mcg total) by mouth daily.     zolpidem (AMBIEN) 5 MG tablet Take 5 mg by mouth at bedtime as needed for sleep.     No current facility-administered medications for this visit.     Musculoskeletal: Strength & Muscle Tone:  N/A Gait & Station:  N/A Patient leans: N/A  Psychiatric Specialty Exam: Review of Systems  Psychiatric/Behavioral:  Negative for agitation, behavioral problems, confusion, decreased concentration, dysphoric mood, hallucinations, self-injury, sleep disturbance and suicidal ideas. The patient is not nervous/anxious and is not hyperactive.   All other systems reviewed and are negative.   There were no vitals taken for this visit.There is no height or weight on file to calculate BMI.  General Appearance: Fairly Groomed  Eye Contact:  Good  Speech:  Clear and Coherent  Volume:  Normal  Mood:   fine  Affect:  Appropriate, Congruent, and calm  Thought Process:  Irrelevant  Orientation:  Full (Time, Place, and Person)  Thought Content: Logical   Suicidal Thoughts:  No  Homicidal Thoughts:  No  Memory:  Immediate;   Good  Judgement:  Fair  Insight:  Shallow  Psychomotor Activity:  Normal  Concentration:  Concentration: Good and Attention Span: Good  Recall:  Good  Fund of Knowledge: Good  Language: Good  Akathisia:  No  Handed:  Right  AIMS (if indicated): not done  Assets:  Communication Skills Desire for Improvement  ADL's:  Intact  Cognition: WNL  Sleep:  Fair   Screenings: PHQ2-9    Flowsheet Row Office Visit from 09/06/2021 in [facility] Office Visit from 10/12/2020 in [name] Video Visit from 07/10/2020 in [facility]  PHQ-2 Total Score 3 0 1  PHQ-9 Total Score 14 -- --       Flowsheet Row Office Visit from 09/06/2021 in [facility] Office Visit from 10/12/2020 in [facility] Video Visit from 07/10/2020 in [facility]  C-SSRS RISK CATEGORY Error: Q3, 4, or 5 should not be populated when Q2 is No No Risk No Risk        Assessment and Plan:  Maria Pace is a 81 y.o. year old female with a history of schizophrenia, neurocognitive disorder, hypertension, type II diabetes, who presents for follow up appointment for below.   1. Schizophrenia, unspecified type (HCC) Although she continues to demonstrate occasional disorganized thought process with rumination, there has been no significant behavior issues at group home since uptitration of olanzapine.  Will continue current dose of olanzapine to target schizophrenia, neuropsychiatric symptoms of paranoia and insomnia.   2. Neurocognitive disorder Unchanged. She has neurocognitive deficits as evidenced by mini cog.  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 MRI in 2021 showed punctate acute/early subacute infarct within the right frontal lobe precentral gyrus, and mild generalized parenchymal atrophy and chronic small vessel ischemic disease. Folate, vitamin B 12 has not been checked for a few years per record. Will consider obtaining those if not done at  her PCP visit in March.  Will not start medication for dementia given risk of polypharmacy outweighs its benefit.      Plan Continue olanzapine 25 mg at night  EKG NSR, HR 96, QTc 467 msec Next appointment: 2/21 at 9 30 for 30 mins, video Maria Pace. [phone]) - on vitamin b 12, vitamin D3  - she will have pcp visit in March 2024. Will consider checking vitamin b12, folate if those are not done.  (She is on Ambien 5 mg)    The patient demonstrates the following risk factors for suicide:  Chronic risk factors for suicide include: psychiatric disorder of schizophrenia. Acute risk factors for suicide include: unemployment. Protective factors for this patient include: positive social support. Considering these factors, the overall suicide risk at this point appears to be low. Patient is appropriate for outpatient follow up.          Collaboration of Care: Collaboration of Care: Other reviewed notes in Epic  Patient/Guardian was advised Release of Information must be obtained prior to any record release in order to collaborate their care with an outside provider. Patient/Guardian was advised if they have not already done so to contact the registration department to sign all necessary forms in order for [place] to release information regarding their care.   Consent: Patient/Guardian gives verbal consent for treatment and assignment of benefits for services provided during this visit. Patient/Guardian expressed understanding and agreed to proceed.    [name], MD 03/30/2022, 10:36 AM

## 2022-03-30 ENCOUNTER — Telehealth (INDEPENDENT_AMBULATORY_CARE_PROVIDER_SITE_OTHER): Payer: Medicare (Managed Care) | Admitting: Psychiatry

## 2022-03-30 ENCOUNTER — Encounter: Payer: Self-pay | Admitting: Psychiatry

## 2022-03-30 DIAGNOSIS — F209 Schizophrenia, unspecified: Secondary | ICD-10-CM | POA: Diagnosis not present

## 2022-03-30 DIAGNOSIS — R419 Unspecified symptoms and signs involving cognitive functions and awareness: Secondary | ICD-10-CM

## 2022-03-30 MED ORDER — OLANZAPINE 5 MG PO TABS: 5.0000 mg | ORAL_TABLET | Freq: Every day | ORAL | 1 refills | Status: DC

## 2022-03-30 MED ORDER — OLANZAPINE 20 MG PO TABS: 20.0000 mg | ORAL_TABLET | Freq: Every day | ORAL | 1 refills | Status: DC

## 2022-05-15 NOTE — Progress Notes (Unsigned)
Virtual Visit via Video Note  I connected with Maria Pace on 05/18/22 at  9:30 AM EST by a video enabled telemedicine application and verified that I am speaking with the correct person using two identifiers.  Location: Patient: group home Provider: office Persons participated in the visit- patient, provider    I discussed the limitations of evaluation and management by telemedicine and the availability of in person appointments. The patient expressed understanding and agreed to proceed.   I discussed the assessment and treatment plan with the patient. The patient was provided an opportunity to ask questions and all were answered. The patient agreed with the plan and demonstrated an understanding of the instructions.   The patient was advised to call back or seek an in-person evaluation if the symptoms worsen or if the condition fails to improve as anticipated.  I provided 11 minutes of non-face-to-face time during this encounter.   Norman Clay, MD    [facility] MD/PA/NP OP Progress Note  05/18/2022 10:09 AM Maria Pace  MRN:  [id number]  Chief Complaint:  Chief Complaint  Patient presents with   Follow-up   HPI:  This is a follow-up appointment for schizophrenia and neurocognitive disorder.  She states that she has been doing okay.  She has started to do crochet (she has been doing this for many years).  She denies any concern at the group home.  And she thinks others are treating her good.  She denies feeling depressed or anxiety.  She denies SI, HI, hallucinations.   Maria Pace, her caregiver presents to the visit.  She states that [place] refused to eat food as she was concerned that people put poison in her food.  She later came back feeling hungry, and was able to eat.  She refuses to get up and go to the bathroom, and she had incontinence.  Maria Pace agrees to work on redirection and behavioral modification.  She denies any safety concern.  Maria Pace has been taking  medication regularly, and sleeps fair at night.     Single, no children. She was born and grew up close to [facility]. She has six siblings Work: unemployed. used to work for [employer], 20's     Visit Diagnosis:    ICD-10-CM   1. Schizophrenia, unspecified type ([place])  F20.9     2. Neurocognitive disorder  R41.9       Past Psychiatric History: Please see initial evaluation for full details. I have reviewed the history. No updates at this time.     Past Medical History:  Past Medical History:  Diagnosis Date   Bipolar 1 disorder ([place])    Essential hypertension    GERD (gastroesophageal reflux disease)    Hyperlipidemia    Irregular heart beat    Schizophrenia ([place])    Type 2 diabetes mellitus ([place])     Past Surgical History:  Procedure Laterality Date   YAG LASER APPLICATION Left [id number]   Procedure: YAG LASER APPLICATION;  Surgeon: Rutherford Guys, MD;  Location: [facility];  Service: Ophthalmology;  Laterality: Left;    Family Psychiatric History: Please see initial evaluation for full details. I have reviewed the history. No updates at this time.     Family History:  Family History  Problem Relation Age of Onset   Hypertension Father    Diabetes type II Father     Social History:  Social History   Socioeconomic History   Marital status: Single    Spouse name: Not on  file   Number of children: Not on file   Years of education: Not on file   Highest education level: Not on file  Occupational History   Not on file  Tobacco Use   Smoking status: Never   Smokeless tobacco: Never  Vaping Use   Vaping Use: Never used  Substance and Sexual Activity   Alcohol use: No   Drug use: No   Sexual activity: Never  Other Topics Concern   Not on file  Social History Narrative   Not on file   Social Determinants of Health   Financial Resource Strain: Not on file  Food Insecurity: Not on file  Transportation Needs: Not on file  Physical Activity: Not on file  Stress:  Not on file  Social Connections: Not on file    Allergies:  Allergies  Allergen Reactions   Haloperidol Lactate Other (See Comments)    Couldn't urinate anymore    Metabolic Disorder Labs: Lab Results  Component Value Date   HGBA1C 6.3 (H) 10/03/2019   MPG 134.11 10/03/2019   No results found for: "PROLACTIN" Lab Results  Component Value Date   CHOL 168 10/10/2019   TRIG 54 10/10/2019   HDL 37 (L) 10/10/2019   CHOLHDL 4.5 10/10/2019   VLDL 11 10/10/2019   LDLCALC 120 (H) 10/10/2019   LDLCALC 99 10/06/2019   Lab Results  Component Value Date   TSH 1.481 10/06/2019   TSH 2.634 05/18/2017    Therapeutic Level Labs: No results found for: "LITHIUM" Lab Results  Component Value Date   VALPROATE 64 06/13/2018   VALPROATE 86.5 02/15/2018   No results found for: "CBMZ"  Current Medications: Current Outpatient Medications  Medication Sig Dispense Refill   albuterol (VENTOLIN HFA) 108 (90 Base) MCG/ACT inhaler Inhale into the lungs.     apixaban (ELIQUIS) 5 MG TABS tablet Take 1 tablet (5 mg total) by mouth 2 (two) times daily. 60 tablet    atorvastatin (LIPITOR) 20 MG tablet Take 20 mg by mouth daily.     Camphor-Eucalyptus-Menthol (VICKS VAPORUB) 4.7-1.2-2.6 % OINT Apply 1 application topically daily. Apply once to toenails daily     cholecalciferol (VITAMIN D3) 10 MCG (400 UNIT) TABS tablet Take 400 Units by mouth.     docusate sodium (COLACE) 100 MG capsule Take 200 mg by mouth daily.     FEROSUL 325 (65 Fe) MG tablet Take by mouth.     furosemide (LASIX) 20 MG tablet Take 20 mg by mouth.     gabapentin (NEURONTIN) 400 MG capsule Take 400 mg by mouth at bedtime.     latanoprost (XALATAN) 0.005 % ophthalmic solution Place 1 drop into both eyes at bedtime.     lisinopril (ZESTRIL) 2.5 MG tablet Take 1 tablet (2.5 mg total) by mouth daily.     loratadine (CLARITIN) 10 MG tablet Take 10 mg by mouth daily.     metFORMIN (GLUCOPHAGE) 500 MG tablet Take 500 mg by mouth 2  (two) times daily with a meal.     metoprolol tartrate (LOPRESSOR) 25 MG tablet Take 1 tablet (25 mg total) by mouth 2 (two) times daily.     Multiple Vitamin (MULTIVITAMIN) tablet Take 1 tablet by mouth daily.     [START ON 05/29/2022] OLANZapine (ZYPREXA) 20 MG tablet Take 1 tablet (20 mg total) by mouth at bedtime. Total of 25 mg at night. Take along with 20 mg tab 30 tablet 1   [START ON 05/29/2022] OLANZapine (ZYPREXA) 5 MG tablet  Take 1 tablet (5 mg total) by mouth at bedtime. Total of 25 mg at night. Take along with 20 mg tab 30 tablet 1   pantoprazole (PROTONIX) 40 MG tablet Take 40 mg by mouth daily.     polyethylene glycol powder (GLYCOLAX/MIRALAX) powder Take 17 g by mouth daily as needed for mild constipation or moderate constipation.     senna-docusate (SENOKOT-S) 8.6-50 MG tablet Take 2 tablets by mouth at bedtime. 60 tablet 0   vitamin B-12 (CYANOCOBALAMIN) 500 MCG tablet Take 1 tablet (500 mcg total) by mouth daily.     zolpidem (AMBIEN) 5 MG tablet Take 5 mg by mouth at bedtime as needed for sleep.     No current facility-administered medications for this visit.     Musculoskeletal: Strength & Muscle Tone:  N/A Gait & Station:  N/A Patient leans: N/A  Psychiatric Specialty Exam: Review of Systems  Psychiatric/Behavioral:  Positive for behavioral problems. Negative for agitation, confusion, decreased concentration, dysphoric mood, hallucinations, self-injury, sleep disturbance and suicidal ideas. The patient is not nervous/anxious and is not hyperactive.   All other systems reviewed and are negative.   There were no vitals taken for this visit.There is no height or weight on file to calculate BMI.  General Appearance: Fairly Groomed  Eye Contact:  Good  Speech:  Clear and Coherent  Volume:  Normal  Mood:   okay  Affect:  Appropriate, Blunt, and Congruent  Thought Process:  Coherent  Orientation:  Full (Time, Place, and Person)  Thought Content: Paranoid Ideation    Suicidal Thoughts:  No  Homicidal Thoughts:  No  Memory:  Immediate;   Good  Judgement:  Fair  Insight:  Shallow  Psychomotor Activity:  Normal  Concentration:  Concentration: Good and Attention Span: Good  Recall:  Poor  Fund of Knowledge: Good  Language: Good  Akathisia:  No  Handed:  Right  AIMS (if indicated): not done  Assets:  Communication Skills Desire for Improvement  ADL's:  Intact  Cognition: WNL  Sleep:  Fair   Screenings: IT sales professional Office Visit from 09/06/2021 in [place] Office Visit from 10/12/2020 in [place] Video Visit from 07/10/2020 in [place]  PHQ-2 Total Score 3 0 1  PHQ-9 Total Score 14 -- --      [place] Office Visit from 09/06/2021 in [place] Office Visit from 10/12/2020 in [place] Video Visit from 07/10/2020 in [place] Error: Q3, 4, or 5 should not be populated when Q2 is No No Risk No Risk        Assessment and Plan:  Maria Pace is a 82 y.o. year old female with a history of schizophrenia, neurocognitive disorder, hypertension, type II diabetes, who presents for follow up appointment for below.   1. Schizophrenia, unspecified type ([place]) Acute stressors include:  Other stressors include:    History: dx with schizophrenia yearly in her life, [facility] in 10/2013. Per chart, ""She was petitioned by her family, her 15 year old aunt with whom she lives, for behaving strangely, talking to herself, throwing her medicines out, putting a spell on Dr. Leonides Schanz, who is a psychiatrist. In the Emergency Room, the patient has been talking constantly, apparently delusional, paranoid and grandiose, telling me that the [facility] cured her."  Originally on Depakote 750  mg  qhs, risperidone 2 mg BID, olanzapine 10 mg qhs, cogentin 1 mg BID There has been overall improvement in disorganized thought process and rumination.  Will continue current dose of olanzapine to target schizophrenia.   2. Neurocognitive disorder  Functional Status Instrumental Activities of Daily Living (IADLs):  ALLISSON ZIMNY is independent in the following:  Requires assistance with the following: managing finances, medications, driving Activities of Daily Living (ADLs):  JACLIN DENENBERG is independent in the following:  feeding, continence, walking (walker), grooming  Requires assistance with the following: bathing,  toileting (occasionally have incontinence)   Folate, Vtamin B12, TSH  Head MRI in 2021: punctate acute/early subacute infarct within the right frontal lobe precentral gyrus, and mild generalized parenchymal atrophy and chronic small vessel ischemic disease.   Neuropsych assessment: Mini cog- "cat" 1/3, clock- 1/3 (draw circle, put numbers of 12,5,10,13..), orientation 4/5 (except date) on 10/2017  Etiology: vascular, r/o alzheimer   Slight worsening in behavior disturbances, including resistance to care, although there is no concern of safety issues.  Will obtain vitamin B12 and folate.  Will continue current dose of olanzapine to target neuropsychiatric symptoms of paranoia and insomnia.  We've opted not to initiate medication for dementia due to the risk of polypharmacy outweighing its potential benefits.   Plan Continue olanzapine 25 mg at night  EKG NSR, HR 96, QTc 467 msec 02/2022 Obtain labs (vitamin B12, folate) Maria Pace will send a note from PCP after the visit in March Next appointment: 2/21 at 9 30 for 30 mins, video Maria Pace. [phone]) - on vitamin b 12, vitamin D3  - she will have pcp visit in March 2024. Will consider checking vitamin b12, folate if those are not done.   (She is on Ambien 5 mg)    The patient demonstrates the following risk factors for  suicide: Chronic risk factors for suicide include: psychiatric disorder of schizophrenia. Acute risk factors for suicide include: unemployment. Protective factors for this patient include: positive social support. Considering these factors, the overall suicide risk at this point appears to be low. Patient is appropriate for outpatient follow up.        Collaboration of Care: Collaboration of Care: Other reviewed notes in Epic  Patient/Guardian was advised Release of Information must be obtained prior to any record release in order to collaborate their care with an outside provider. Patient/Guardian was advised if they have not already done so to contact the registration department to sign all necessary forms in order for [place] to release information regarding their care.   Consent: Patient/Guardian gives verbal consent for treatment and assignment of benefits for services provided during this visit. Patient/Guardian expressed understanding and agreed to proceed.    Norman Clay, MD 05/18/2022, 10:09 AM

## 2022-05-18 ENCOUNTER — Telehealth (INDEPENDENT_AMBULATORY_CARE_PROVIDER_SITE_OTHER): Payer: Medicare (Managed Care) | Admitting: Psychiatry

## 2022-05-18 ENCOUNTER — Encounter: Payer: Self-pay | Admitting: Psychiatry

## 2022-05-18 DIAGNOSIS — R419 Unspecified symptoms and signs involving cognitive functions and awareness: Secondary | ICD-10-CM

## 2022-05-18 DIAGNOSIS — F209 Schizophrenia, unspecified: Secondary | ICD-10-CM | POA: Diagnosis not present

## 2022-05-18 MED ORDER — OLANZAPINE 5 MG PO TABS: 5.0000 mg | ORAL_TABLET | Freq: Every day | ORAL | 1 refills | Status: DC

## 2022-05-18 MED ORDER — OLANZAPINE 20 MG PO TABS: 20.0000 mg | ORAL_TABLET | Freq: Every day | ORAL | 1 refills | Status: DC

## 2022-07-09 NOTE — Progress Notes (Unsigned)
[facility] MD/PA/NP OP Progress Note  07/09/2022 3:21 PM Maria Pace  MRN:  [id number]  Chief Complaint: No chief complaint on file.  HPI: *** Labs,   Visit Diagnosis: No diagnosis found.  Past Psychiatric History: Please see initial evaluation for full details. I have reviewed the history. No updates at this time.     Past Medical History:  Past Medical History:  Diagnosis Date   Bipolar 1 disorder (HCC)    Essential hypertension    GERD (gastroesophageal reflux disease)    Hyperlipidemia    Irregular heart beat    Schizophrenia (HCC)    Type 2 diabetes mellitus (HCC)     Past Surgical History:  Procedure Laterality Date   YAG LASER APPLICATION Left 01/06/2015   Procedure: YAG LASER APPLICATION;  Surgeon: Jethro Bolus, MD;  Location: [facility];  Service: Ophthalmology;  Laterality: Left;    Family Psychiatric History: Please see initial evaluation for full details. I have reviewed the history. No updates at this time.     Family History:  Family History  Problem Relation Age of Onset   Hypertension Father    Diabetes type II Father     Social History:  Social History   Socioeconomic History   Marital status: Single    Spouse name: Not on file   Number of children: Not on file   Years of education: Not on file   Highest education level: Not on file  Occupational History   Not on file  Tobacco Use   Smoking status: Never   Smokeless tobacco: Never  Vaping Use   Vaping Use: Never used  Substance and Sexual Activity   Alcohol use: No   Drug use: No   Sexual activity: Never  Other Topics Concern   Not on file  Social History Narrative   Not on file   Social Determinants of Health   Financial Resource Strain: Not on file  Food Insecurity: Not on file  Transportation Needs: Not on file  Physical Activity: Not on file  Stress: Not on file  Social Connections: Not on file    Allergies:  Allergies  Allergen Reactions   Haloperidol Lactate Other (See  Comments)    Couldn't urinate anymore    Metabolic Disorder Labs: Lab Results  Component Value Date   HGBA1C 6.3 (H) 10/03/2019   MPG 134.11 10/03/2019   No results found for: "PROLACTIN" Lab Results  Component Value Date   CHOL 168 10/10/2019   TRIG 54 10/10/2019   HDL 37 (L) 10/10/2019   CHOLHDL 4.5 10/10/2019   VLDL 11 10/10/2019   LDLCALC 120 (H) 10/10/2019   LDLCALC 99 10/06/2019   Lab Results  Component Value Date   TSH 1.481 10/06/2019   TSH 2.634 05/18/2017    Therapeutic Level Labs: No results found for: "LITHIUM" Lab Results  Component Value Date   VALPROATE 64 06/13/2018   VALPROATE 86.5 02/15/2018   No results found for: "CBMZ"  Current Medications: Current Outpatient Medications  Medication Sig Dispense Refill   albuterol (VENTOLIN HFA) 108 (90 Base) MCG/ACT inhaler Inhale into the lungs.     apixaban (ELIQUIS) 5 MG TABS tablet Take 1 tablet (5 mg total) by mouth 2 (two) times daily. 60 tablet    atorvastatin (LIPITOR) 20 MG tablet Take 20 mg by mouth daily.     Camphor-Eucalyptus-Menthol (VICKS VAPORUB) 4.7-1.2-2.6 % OINT Apply 1 application topically daily. Apply once to toenails daily     cholecalciferol (VITAMIN D3) 10 MCG (  400 UNIT) TABS tablet Take 400 Units by mouth.     docusate sodium (COLACE) 100 MG capsule Take 200 mg by mouth daily.     FEROSUL 325 (65 Fe) MG tablet Take by mouth.     furosemide (LASIX) 20 MG tablet Take 20 mg by mouth.     gabapentin (NEURONTIN) 400 MG capsule Take 400 mg by mouth at bedtime.     latanoprost (XALATAN) 0.005 % ophthalmic solution Place 1 drop into both eyes at bedtime.     lisinopril (ZESTRIL) 2.5 MG tablet Take 1 tablet (2.5 mg total) by mouth daily.     loratadine (CLARITIN) 10 MG tablet Take 10 mg by mouth daily.     metFORMIN (GLUCOPHAGE) 500 MG tablet Take 500 mg by mouth 2 (two) times daily with a meal.     metoprolol tartrate (LOPRESSOR) 25 MG tablet Take 1 tablet (25 mg total) by mouth 2 (two) times  daily.     Multiple Vitamin (MULTIVITAMIN) tablet Take 1 tablet by mouth daily.     OLANZapine (ZYPREXA) 20 MG tablet Take 1 tablet (20 mg total) by mouth at bedtime. Total of 25 mg at night. Take along with 20 mg tab 30 tablet 1   OLANZapine (ZYPREXA) 5 MG tablet Take 1 tablet (5 mg total) by mouth at bedtime. Total of 25 mg at night. Take along with 20 mg tab 30 tablet 1   pantoprazole (PROTONIX) 40 MG tablet Take 40 mg by mouth daily.     polyethylene glycol powder (GLYCOLAX/MIRALAX) powder Take 17 g by mouth daily as needed for mild constipation or moderate constipation.     senna-docusate (SENOKOT-S) 8.6-50 MG tablet Take 2 tablets by mouth at bedtime. 60 tablet 0   vitamin B-12 (CYANOCOBALAMIN) 500 MCG tablet Take 1 tablet (500 mcg total) by mouth daily.     zolpidem (AMBIEN) 5 MG tablet Take 5 mg by mouth at bedtime as needed for sleep.     No current facility-administered medications for this visit.     Musculoskeletal: Strength & Muscle Tone:  N/A Gait & Station:  N/A Patient leans: N/A  Psychiatric Specialty Exam: Review of Systems  There were no vitals taken for this visit.There is no height or weight on file to calculate BMI.  General Appearance: {Appearance:22683}  Eye Contact:  {BHH EYE CONTACT:22684}  Speech:  Clear and Coherent  Volume:  Normal  Mood:  {BHH MOOD:22306}  Affect:  {Affect (PAA):22687}  Thought Process:  Coherent  Orientation:  Full (Time, Place, and Person)  Thought Content: Logical   Suicidal Thoughts:  {ST/HT (PAA):22692}  Homicidal Thoughts:  {ST/HT (PAA):22692}  Memory:  Immediate;   Good  Judgement:  {Judgement (PAA):22694}  Insight:  {Insight (PAA):22695}  Psychomotor Activity:  Normal  Concentration:  Concentration: Good and Attention Span: Good  Recall:  Good  Fund of Knowledge: Good  Language: Good  Akathisia:  No  Handed:  Right  AIMS (if indicated): not done  Assets:  Communication Skills Desire for Improvement  ADL's:  Intact   Cognition: WNL  Sleep:  {BHH GOOD/FAIR/POOR:22877}   Screenings: [employer] [facility] Office Visit from 09/06/2021 in [facility] Office Visit from 10/12/2020 in [facility] Video Visit from 07/10/2020 in [facility]  PHQ-2 Total Score 3 0 1  PHQ-9 Total Score 14 -- --      [facility]sheet Row Office Visit from 09/06/2021 in [facility]  [facility] Office Visit from 10/12/2020 in [facility] Video Visit from 07/10/2020 in [facility]  C-SSRS RISK CATEGORY Error: Q3, 4, or 5 should not be populated when Q2 is No No Risk No Risk        Assessment and Plan:  CLIO GERHART is a 82 y.o. year old female with a history of schizophrenia, neurocognitive disorder, hypertension, type II diabetes, who presents for follow up appointment for below.    1. Schizophrenia, unspecified type (HCC) Acute stressors include:  Other stressors include:    History: dx with schizophrenia in her early life, admitted to [facility] in 10/2013. Per chart, ""She was petitioned by her family, her 30 year old aunt with whom she lives, for behaving strangely, talking to herself, throwing her medicines out, putting a spell on Dr. Elesa Massed, who is a psychiatrist. In the Emergency Room, the patient has been talking constantly, apparently delusional, paranoid and grandiose, telling me that the [facility] cured her."  Originally on Depakote 750 mg qhs, risperidone 2 mg BID, olanzapine 10 mg qhs, cogentin 1 mg BID There has been overall improvement in disorganized thought process and rumination.  Will continue current dose of olanzapine to target schizophrenia.    2. Neurocognitive disorder      Functional Status IADL- Requires assistance with the following: managing finances, medications, driving ADL-  independent in the following:  feeding, continence, walking (walker), grooming Requires assistance with the following: bathing,  toileting (occasionally have incontinence)   Folate, Vtamin B12, TSH Head MRI in 2021: punctate acute/early subacute infarct within the right frontal lobe precentral gyrus, and mild generalized parenchymal atrophy and chronic small vessel ischemic disease.  Neuropsych assessment: Mini cog- "cat" 1/3, clock- 1/3 (draw circle, put numbers of 12,5,10,13..), orientation 4/5 (except date) on 10/2017 Etiology: vascular, r/o alzheimer   Slight worsening in behavior disturbances, including resistance to care, although there is no concern of safety issues.  Will obtain vitamin B12 and folate.  Will continue current dose of olanzapine to target neuropsychiatric symptoms of paranoia and insomnia.  We've opted not to initiate medication for dementia due to the risk of polypharmacy outweighing its potential benefits.   Plan Continue olanzapine 25 mg at night  EKG NSR, HR 96, QTc 467 msec 02/2022 Obtain labs (vitamin B12, folate) Ms. Todd will send a note from PCP after the visit in March Next appointment: 2/21 at 9 30 for 30 mins, video [name]. [phone]) - on vitamin b 12, vitamin D3  - she will have pcp visit in March 2024. Will consider checking vitamin b12, folate if those are not done.   (She is on Ambien 5 mg)    The patient demonstrates the following risk factors for suicide: Chronic risk factors for suicide include: psychiatric disorder of schizophrenia. Acute risk factors for suicide include: unemployment. Protective factors for this patient include: positive social support. Considering these factors, the overall suicide risk at this point appears to be low. Patient is appropriate for outpatient follow up.    Collaboration of Care: Collaboration of Care: {BH OP Collaboration of Care:21014065}  Patient/Guardian was advised Release of Information must be obtained  prior to any record release in order to collaborate their care with an outside provider. Patient/Guardian was advised if they have not already done so to contact the registration department to sign all necessary forms in order for [place] to release information regarding their care.   Consent: Patient/Guardian gives verbal  consent for treatment and assignment of benefits for services provided during this visit. Patient/Guardian expressed understanding and agreed to proceed.    [name], MD 07/09/2022, 3:21 PM

## 2022-07-12 ENCOUNTER — Emergency Department (HOSPITAL_COMMUNITY): Payer: Medicare (Managed Care)

## 2022-07-12 ENCOUNTER — Inpatient Hospital Stay (HOSPITAL_COMMUNITY)
Admission: EM | Admit: 2022-07-12 | Discharge: 2022-07-14 | DRG: 871 | Disposition: A | Discharge status: Home / Self Care | Payer: Medicare (Managed Care) | Source: Skilled Nursing Facility | Attending: Internal Medicine | Admitting: Internal Medicine

## 2022-07-12 ENCOUNTER — Encounter (HOSPITAL_COMMUNITY): Payer: Self-pay | Admitting: *Deleted

## 2022-07-12 ENCOUNTER — Other Ambulatory Visit: Payer: Self-pay

## 2022-07-12 DIAGNOSIS — I471 Supraventricular tachycardia, unspecified: Secondary | ICD-10-CM | POA: Diagnosis not present

## 2022-07-12 DIAGNOSIS — I130 Hypertensive heart and chronic kidney disease with heart failure and stage 1 through stage 4 chronic kidney disease, or unspecified chronic kidney disease: Secondary | ICD-10-CM | POA: Diagnosis present

## 2022-07-12 DIAGNOSIS — E1121 Type 2 diabetes mellitus with diabetic nephropathy: Secondary | ICD-10-CM

## 2022-07-12 DIAGNOSIS — Z7984 Long term (current) use of oral hypoglycemic drugs: Secondary | ICD-10-CM

## 2022-07-12 DIAGNOSIS — A419 Sepsis, unspecified organism: Secondary | ICD-10-CM

## 2022-07-12 DIAGNOSIS — I10 Essential (primary) hypertension: Secondary | ICD-10-CM | POA: Diagnosis present

## 2022-07-12 DIAGNOSIS — U071 COVID-19: Secondary | ICD-10-CM | POA: Diagnosis present

## 2022-07-12 DIAGNOSIS — E669 Obesity, unspecified: Secondary | ICD-10-CM | POA: Diagnosis present

## 2022-07-12 DIAGNOSIS — J159 Unspecified bacterial pneumonia: Secondary | ICD-10-CM | POA: Diagnosis present

## 2022-07-12 DIAGNOSIS — F319 Bipolar disorder, unspecified: Secondary | ICD-10-CM | POA: Diagnosis present

## 2022-07-12 DIAGNOSIS — E785 Hyperlipidemia, unspecified: Secondary | ICD-10-CM | POA: Diagnosis present

## 2022-07-12 DIAGNOSIS — Z8249 Family history of ischemic heart disease and other diseases of the circulatory system: Secondary | ICD-10-CM | POA: Diagnosis not present

## 2022-07-12 DIAGNOSIS — Z888 Allergy status to other drugs, medicaments and biological substances status: Secondary | ICD-10-CM

## 2022-07-12 DIAGNOSIS — E876 Hypokalemia: Secondary | ICD-10-CM | POA: Diagnosis present

## 2022-07-12 DIAGNOSIS — E871 Hypo-osmolality and hyponatremia: Secondary | ICD-10-CM | POA: Diagnosis present

## 2022-07-12 DIAGNOSIS — Z6839 Body mass index (BMI) 39.0-39.9, adult: Secondary | ICD-10-CM

## 2022-07-12 DIAGNOSIS — Z7901 Long term (current) use of anticoagulants: Secondary | ICD-10-CM | POA: Diagnosis not present

## 2022-07-12 DIAGNOSIS — N1832 Chronic kidney disease, stage 3b: Secondary | ICD-10-CM | POA: Diagnosis present

## 2022-07-12 DIAGNOSIS — Z79899 Other long term (current) drug therapy: Secondary | ICD-10-CM

## 2022-07-12 DIAGNOSIS — E119 Type 2 diabetes mellitus without complications: Secondary | ICD-10-CM

## 2022-07-12 DIAGNOSIS — Z833 Family history of diabetes mellitus: Secondary | ICD-10-CM | POA: Diagnosis not present

## 2022-07-12 DIAGNOSIS — A4189 Other specified sepsis: Secondary | ICD-10-CM | POA: Diagnosis present

## 2022-07-12 DIAGNOSIS — E1122 Type 2 diabetes mellitus with diabetic chronic kidney disease: Secondary | ICD-10-CM | POA: Diagnosis present

## 2022-07-12 DIAGNOSIS — I5042 Chronic combined systolic (congestive) and diastolic (congestive) heart failure: Secondary | ICD-10-CM | POA: Diagnosis present

## 2022-07-12 DIAGNOSIS — E860 Dehydration: Secondary | ICD-10-CM | POA: Diagnosis present

## 2022-07-12 DIAGNOSIS — R6520 Severe sepsis without septic shock: Secondary | ICD-10-CM

## 2022-07-12 DIAGNOSIS — K219 Gastro-esophageal reflux disease without esophagitis: Secondary | ICD-10-CM | POA: Diagnosis present

## 2022-07-12 LAB — URINALYSIS, W/ REFLEX TO CULTURE (INFECTION SUSPECTED)
Bilirubin Urine: NEGATIVE
Glucose, UA: NEGATIVE mg/dL
Hgb urine dipstick: NEGATIVE
Ketones, ur: NEGATIVE mg/dL
Nitrite: NEGATIVE
Protein, ur: NEGATIVE mg/dL
Specific Gravity, Urine: 1.011 (ref 1.005–1.030)
pH: 7 (ref 5.0–8.0)

## 2022-07-12 LAB — CBC WITH DIFFERENTIAL/PLATELET
Abs Immature Granulocytes: 0.04 10*3/uL (ref 0.00–0.07)
Basophils Absolute: 0 10*3/uL (ref 0.0–0.1)
Basophils Relative: 0 %
Eosinophils Absolute: 0 10*3/uL (ref 0.0–0.5)
Eosinophils Relative: 0 %
HCT: 31 % — ABNORMAL LOW (ref 36.0–46.0)
Hemoglobin: 10 g/dL — ABNORMAL LOW (ref 12.0–15.0)
Immature Granulocytes: 0 %
Lymphocytes Relative: 8 %
Lymphs Abs: 0.8 10*3/uL (ref 0.7–4.0)
MCH: 28.5 pg (ref 26.0–34.0)
MCHC: 32.3 g/dL (ref 30.0–36.0)
MCV: 88.3 fL (ref 80.0–100.0)
Monocytes Absolute: 1.2 10*3/uL — ABNORMAL HIGH (ref 0.1–1.0)
Monocytes Relative: 13 %
Neutro Abs: 7.7 10*3/uL (ref 1.7–7.7)
Neutrophils Relative %: 79 %
Platelets: 196 10*3/uL (ref 150–400)
RBC: 3.51 MIL/uL — ABNORMAL LOW (ref 3.87–5.11)
RDW: 15.4 % (ref 11.5–15.5)
WBC: 9.8 10*3/uL (ref 4.0–10.5)
nRBC: 0 % (ref 0.0–0.2)

## 2022-07-12 LAB — GLUCOSE, CAPILLARY
Glucose-Capillary: 104 mg/dL — ABNORMAL HIGH (ref 70–99)
Glucose-Capillary: 151 mg/dL — ABNORMAL HIGH (ref 70–99)

## 2022-07-12 LAB — PROCALCITONIN: Procalcitonin: 0.23 ng/mL

## 2022-07-12 LAB — MRSA NEXT GEN BY PCR, NASAL: MRSA by PCR Next Gen: NOT DETECTED

## 2022-07-12 LAB — CORTISOL: Cortisol, Plasma: 8 ug/dL

## 2022-07-12 LAB — RESP PANEL BY RT-PCR (RSV, FLU A&B, COVID)  RVPGX2
Influenza A by PCR: NEGATIVE
Influenza B by PCR: NEGATIVE
Resp Syncytial Virus by PCR: NEGATIVE
SARS Coronavirus 2 by RT PCR: POSITIVE — AB

## 2022-07-12 LAB — COMPREHENSIVE METABOLIC PANEL
ALT: 19 U/L (ref 0–44)
AST: 21 U/L (ref 15–41)
Albumin: 3.2 g/dL — ABNORMAL LOW (ref 3.5–5.0)
Alkaline Phosphatase: 62 U/L (ref 38–126)
Anion gap: 6 (ref 5–15)
BUN: 17 mg/dL (ref 8–23)
CO2: 21 mmol/L — ABNORMAL LOW (ref 22–32)
Calcium: 7.9 mg/dL — ABNORMAL LOW (ref 8.9–10.3)
Chloride: 104 mmol/L (ref 98–111)
Creatinine, Ser: 1.16 mg/dL — ABNORMAL HIGH (ref 0.44–1.00)
GFR, Estimated: 48 mL/min — ABNORMAL LOW (ref >60)
Glucose, Bld: 135 mg/dL — ABNORMAL HIGH (ref 70–99)
Potassium: 3.2 mmol/L — ABNORMAL LOW (ref 3.5–5.1)
Sodium: 131 mmol/L — ABNORMAL LOW (ref 135–145)
Total Bilirubin: 0.2 mg/dL — ABNORMAL LOW (ref 0.3–1.2)
Total Protein: 6.6 g/dL (ref 6.5–8.1)

## 2022-07-12 LAB — CULTURE, BLOOD (ROUTINE X 2)

## 2022-07-12 LAB — PROTIME-INR
INR: 1.3 — ABNORMAL HIGH (ref 0.8–1.2)
Prothrombin Time: 16.2 seconds — ABNORMAL HIGH (ref 11.4–15.2)

## 2022-07-12 LAB — HEMOGLOBIN A1C
Hgb A1c MFr Bld: 6.2 % — ABNORMAL HIGH (ref 4.8–5.6)
Mean Plasma Glucose: 131.24 mg/dL

## 2022-07-12 LAB — APTT: aPTT: 34 seconds (ref 24–36)

## 2022-07-12 LAB — LACTIC ACID, PLASMA: Lactic Acid, Venous: 1.5 mmol/L (ref 0.5–1.9)

## 2022-07-12 LAB — SEDIMENTATION RATE: Sed Rate: 44 mm/hr — ABNORMAL HIGH (ref 0–22)

## 2022-07-12 LAB — BRAIN NATRIURETIC PEPTIDE: B Natriuretic Peptide: 897 pg/mL — ABNORMAL HIGH (ref 0.0–100.0)

## 2022-07-12 MED ORDER — VANCOMYCIN HCL 500 MG/100ML IV SOLN
500.0000 mg | INTRAVENOUS | Status: DC
  Filled 2022-07-12 (×2): qty 100

## 2022-07-12 MED ORDER — APIXABAN 5 MG PO TABS
5.0000 mg | ORAL_TABLET | Freq: Two times a day (BID) | ORAL | Status: DC
  Administered 2022-07-12 – 2022-07-14 (×4): 5 mg via ORAL
  Filled 2022-07-12 (×4): qty 1

## 2022-07-12 MED ORDER — METOPROLOL TARTRATE 25 MG PO TABS
12.5000 mg | ORAL_TABLET | Freq: Two times a day (BID) | ORAL | Status: DC
  Administered 2022-07-12 – 2022-07-13 (×2): 12.5 mg via ORAL
  Filled 2022-07-12 (×2): qty 1

## 2022-07-12 MED ORDER — VITAMIN C 500 MG PO TABS
500.0000 mg | ORAL_TABLET | Freq: Every day | ORAL | Status: DC
  Administered 2022-07-12 – 2022-07-14 (×3): 500 mg via ORAL
  Filled 2022-07-12 (×3): qty 1

## 2022-07-12 MED ORDER — IPRATROPIUM-ALBUTEROL 0.5-2.5 (3) MG/3ML IN SOLN: 3.0000 mL | Freq: Four times a day (QID) | RESPIRATORY_TRACT | Status: DC | PRN

## 2022-07-12 MED ORDER — LACTATED RINGERS IV SOLN
INTRAVENOUS | Status: AC
  Administered 2022-07-12: 150 mL/h via INTRAVENOUS

## 2022-07-12 MED ORDER — INSULIN ASPART 100 UNIT/ML IJ SOLN
0.0000 [IU] | Freq: Every day | INTRAMUSCULAR | Status: DC
  Administered 2022-07-13: 3 [IU] via SUBCUTANEOUS

## 2022-07-12 MED ORDER — OLANZAPINE 5 MG PO TABS: 20.0000 mg | ORAL_TABLET | Freq: Every day | ORAL | Status: DC

## 2022-07-12 MED ORDER — SODIUM CHLORIDE 0.9 % IV SOLN
2.0000 g | Freq: Two times a day (BID) | INTRAVENOUS | Status: DC
  Administered 2022-07-12 – 2022-07-14 (×4): 2 g via INTRAVENOUS
  Filled 2022-07-12 (×4): qty 12.5

## 2022-07-12 MED ORDER — PREDNISONE 20 MG PO TABS: 50.0000 mg | ORAL_TABLET | Freq: Every day | ORAL | Status: DC

## 2022-07-12 MED ORDER — SODIUM CHLORIDE 0.9 % IV SOLN
2.0000 g | Freq: Once | INTRAVENOUS | Status: AC
  Administered 2022-07-12: 2 g via INTRAVENOUS
  Filled 2022-07-12: qty 12.5

## 2022-07-12 MED ORDER — OLANZAPINE 5 MG PO TABS: 5.0000 mg | ORAL_TABLET | Freq: Every day | ORAL | Status: DC

## 2022-07-12 MED ORDER — VANCOMYCIN HCL IN DEXTROSE 1-5 GM/200ML-% IV SOLN: 1000.0000 mg | Freq: Once | INTRAVENOUS | Status: DC

## 2022-07-12 MED ORDER — VANCOMYCIN HCL 2000 MG/400ML IV SOLN
2000.0000 mg | Freq: Once | INTRAVENOUS | Status: AC
  Administered 2022-07-12: 2000 mg via INTRAVENOUS
  Filled 2022-07-12: qty 400

## 2022-07-12 MED ORDER — BUDESONIDE 0.5 MG/2ML IN SUSP
0.5000 mg | Freq: Two times a day (BID) | RESPIRATORY_TRACT | Status: DC
  Administered 2022-07-12 – 2022-07-14 (×3): 0.5 mg via RESPIRATORY_TRACT
  Filled 2022-07-12 (×4): qty 2

## 2022-07-12 MED ORDER — OLANZAPINE 5 MG PO TABS
25.0000 mg | ORAL_TABLET | Freq: Every day | ORAL | Status: DC
  Administered 2022-07-12 – 2022-07-13 (×2): 25 mg via ORAL
  Filled 2022-07-12 (×2): qty 5

## 2022-07-12 MED ORDER — METOPROLOL TARTRATE 5 MG/5ML IV SOLN
5.0000 mg | Freq: Once | INTRAVENOUS | Status: DC
  Filled 2022-07-12: qty 5

## 2022-07-12 MED ORDER — PANTOPRAZOLE SODIUM 40 MG PO TBEC
40.0000 mg | DELAYED_RELEASE_TABLET | Freq: Every day | ORAL | Status: DC
  Administered 2022-07-12 – 2022-07-14 (×3): 40 mg via ORAL
  Filled 2022-07-12 (×3): qty 1

## 2022-07-12 MED ORDER — GABAPENTIN 400 MG PO CAPS
400.0000 mg | ORAL_CAPSULE | Freq: Every day | ORAL | Status: DC
  Administered 2022-07-12 – 2022-07-13 (×2): 400 mg via ORAL
  Filled 2022-07-12 (×2): qty 1

## 2022-07-12 MED ORDER — ATORVASTATIN CALCIUM 20 MG PO TABS
20.0000 mg | ORAL_TABLET | Freq: Every day | ORAL | Status: DC
  Administered 2022-07-12 – 2022-07-14 (×3): 20 mg via ORAL
  Filled 2022-07-12 (×3): qty 1

## 2022-07-12 MED ORDER — DOCUSATE SODIUM 100 MG PO CAPS
200.0000 mg | ORAL_CAPSULE | Freq: Every day | ORAL | Status: DC
  Administered 2022-07-12 – 2022-07-14 (×3): 200 mg via ORAL
  Filled 2022-07-12 (×3): qty 2

## 2022-07-12 MED ORDER — ADULT MULTIVITAMIN W/MINERALS CH
1.0000 | ORAL_TABLET | Freq: Every day | ORAL | Status: DC
  Administered 2022-07-12 – 2022-07-14 (×3): 1 via ORAL
  Filled 2022-07-12 (×3): qty 1

## 2022-07-12 MED ORDER — ZINC SULFATE 220 (50 ZN) MG PO CAPS
220.0000 mg | ORAL_CAPSULE | Freq: Every day | ORAL | Status: DC
  Administered 2022-07-12 – 2022-07-14 (×3): 220 mg via ORAL
  Filled 2022-07-12 (×3): qty 1

## 2022-07-12 MED ORDER — ACETAMINOPHEN 500 MG PO TABS
1000.0000 mg | ORAL_TABLET | Freq: Once | ORAL | Status: AC
  Administered 2022-07-12: 1000 mg via ORAL
  Filled 2022-07-12: qty 2

## 2022-07-12 MED ORDER — LACTATED RINGERS IV BOLUS (SEPSIS)
500.0000 mL | Freq: Once | INTRAVENOUS | Status: AC
  Administered 2022-07-12: 500 mL via INTRAVENOUS

## 2022-07-12 MED ORDER — NIRMATRELVIR/RITONAVIR (PAXLOVID)TABLET: 3.0000 | ORAL_TABLET | Freq: Two times a day (BID) | ORAL | Status: DC

## 2022-07-12 MED ORDER — METOPROLOL TARTRATE 25 MG PO TABS
25.0000 mg | ORAL_TABLET | Freq: Once | ORAL | Status: AC
  Administered 2022-07-12: 25 mg via ORAL
  Filled 2022-07-12: qty 1

## 2022-07-12 MED ORDER — LACTATED RINGERS IV BOLUS (SEPSIS)
1000.0000 mL | Freq: Once | INTRAVENOUS | Status: AC
  Administered 2022-07-12: 1000 mL via INTRAVENOUS

## 2022-07-12 MED ORDER — VANCOMYCIN HCL 1250 MG/250ML IV SOLN
1250.0000 mg | INTRAVENOUS | Status: DC
  Administered 2022-07-14: 1250 mg via INTRAVENOUS
  Filled 2022-07-12: qty 250

## 2022-07-12 MED ORDER — LATANOPROST 0.005 % OP SOLN
1.0000 [drp] | Freq: Every day | OPHTHALMIC | Status: DC
  Administered 2022-07-12 – 2022-07-13 (×2): 1 [drp] via OPHTHALMIC
  Filled 2022-07-12: qty 2.5

## 2022-07-12 MED ORDER — METRONIDAZOLE 500 MG/100ML IV SOLN
500.0000 mg | Freq: Once | INTRAVENOUS | Status: AC
  Administered 2022-07-12: 500 mg via INTRAVENOUS
  Filled 2022-07-12: qty 100

## 2022-07-12 MED ORDER — METHYLPREDNISOLONE SODIUM SUCC 125 MG IJ SOLR
0.5000 mg/kg | Freq: Two times a day (BID) | INTRAMUSCULAR | Status: DC
  Administered 2022-07-12 – 2022-07-14 (×4): 45.625 mg via INTRAVENOUS
  Filled 2022-07-12 (×4): qty 2

## 2022-07-12 MED ORDER — POLYETHYLENE GLYCOL 3350 17 G PO PACK: 17.0000 g | PACK | Freq: Every day | ORAL | Status: DC | PRN

## 2022-07-12 MED ORDER — INSULIN ASPART 100 UNIT/ML IJ SOLN
0.0000 [IU] | Freq: Three times a day (TID) | INTRAMUSCULAR | Status: DC
  Administered 2022-07-13: 3 [IU] via SUBCUTANEOUS
  Administered 2022-07-13: 1 [IU] via SUBCUTANEOUS
  Administered 2022-07-13 – 2022-07-14 (×2): 2 [IU] via SUBCUTANEOUS

## 2022-07-12 NOTE — Progress Notes (Addendum)
Pharmacy Antibiotic Note  Maria Pace is a 82 y.o. female admitted on 07/12/2022 with sepsis.  Pharmacy has been consulted for vancomycin and cefepime dosing.   Patient is Febrile (103.4), has pule rate of 146, RR 26, BP 118/59. WBC of 9.8 WNL.  Plan: Load with vancomycin 2g x 1 now then, Vancomycin 500 mg IV Q 24 hrs. Goal AUC 400-550. Expected AUC:~400 SCr used: 1.16  Cefepime: 2 g Q 12 hrs  Height: 5' (152.4 cm) Weight: 90.7 kg (200 lb) IBW/kg (Calculated) : 45.5  Temp (24hrs), Avg:101.7 F (38.7 C), Min:99.3 F (37.4 C), Max:103.4 F (39.7 C)  Recent Labs  Lab 07/12/22 0828  WBC 9.8  CREATININE 1.16*  LATICACIDVEN 1.5    Estimated Creatinine Clearance: 39.5 mL/min (A) (by C-G formula based on SCr of 1.16 mg/dL (H)).    Allergies  Allergen Reactions   Haloperidol Lactate Other (See Comments)    Couldn't urinate anymore    Antimicrobials this admission: Vancomycin 4/16 >>  Cefepime 4/16 >>  Flagyl X 1  Dose adjustments this admission: Vanc was dose adjusted based on CrCl of 39 ml/min and Q24 hr dosing interval was recommended.  Microbiology results: Cultures are pending.  Thank you for allowing pharmacy to be a part of this patient's care.  Clarisse Rodriges 07/12/2022 9:57 AM

## 2022-07-12 NOTE — Assessment & Plan Note (Signed)
Continue PPI ?

## 2022-07-12 NOTE — Assessment & Plan Note (Signed)
-  Holding oral hypoglycemic agents while inpatient -Update A1c -Sliding scale insulin has been ordered.

## 2022-07-12 NOTE — Assessment & Plan Note (Signed)
Continue statin. 

## 2022-07-12 NOTE — ED Triage Notes (Signed)
PT in via [facility] from [facility] [place]) c/o cough & cold x 2 days, n/v & generalized weakness, pt reported to be febrile & tachycardia pta, CBG 150, oral 102.7, A&O x4

## 2022-07-12 NOTE — Assessment & Plan Note (Signed)
-  Soft blood pressure on presentation -Will hold antihypertensive agents except for metoprolol. -Follow-up vital signs and maintain adequate/judicious hydration.

## 2022-07-12 NOTE — Assessment & Plan Note (Signed)
-  Update 2D echo -BNP was elevated -Follow daily weights and strict intake and output -No crackles appreciated on auscultation.

## 2022-07-12 NOTE — Assessment & Plan Note (Addendum)
-  Patient meeting sepsis at time of admission with source of infection pneumonia; concerning for COVID-19 and superimposed bacterial infection.  Patient with high-grade temperature, tachycardia, tachypnea and expressing low oxygen levels at home. -Positive COVID PCR in the ED -Of rest no hypoxia isolated in house. -Chest x-ray without frank infiltrates; but demonstrating diffuse bronchitic changes. -Follow inflammatory markers, no meeting criteria for remdesivir or Paxlovid at the moment; taking into consideration extensive symptomatic presentation will continue broad spectrum antibiotics and start steroids. -Check cortisol level. -As needed bronchodilators, mucolytic's and flutter valve has been ordered. -Continue vitamin C and zinc.

## 2022-07-12 NOTE — H&P (Addendum)
History and Physical    Patient: Maria Pace:[id number] DOB: 1940/08/07 DOA: 07/12/2022 DOS: the patient was seen and examined on 07/12/2022 PCP: Benetta Spar, MD  Patient coming from: Home  Chief Complaint:  Chief Complaint  Patient presents with   Cough   HPI: Maria Pace is a 82 y.o. female with medical history significant of bipolar disorder/schizophrenia, irregular heartbeat/SVT on chronic Eliquis, chronic systolic/diastolic heart failure, type 2 diabetes, hypertension, hyperlipidemia and GERD; who presented to the hospital secondary to general malaise, shortness of breath, coughing spells, fever and per patient reports oxygen desaturation with activity. Patient expressed symptoms have been present for the last 2-3 days and worsening.  She now also reported decreased in her appetite and also decreasing her urine output. Patient denies orthopnea, lower extremity swelling, chest pain, nausea, vomiting, hematuria, dysuria, focal weaknesses or any other complaints.  The ED workup positive for COVID-19 PCR, bronchitic changes on x-ray and high-grade temperature.  Patient was also tachycardic and tachypneic meeting criteria for sepsis.  Portable x-ray unrevealing of frank infiltrates or opacities, but suggesting bronchitic changes.  Cultures taken, fluid resuscitation initiated and [facility] contacted to place patient in the hospital for further evaluation and management.  Unable to provide Paxlovid as initially suggested given chronic use of Eliquis.  Review of Systems: As mentioned in the history of present illness. All other systems reviewed and are negative. Past Medical History:  Diagnosis Date   Bipolar 1 disorder    Essential hypertension    GERD (gastroesophageal reflux disease)    Hyperlipidemia    Irregular heart beat    Schizophrenia    Type 2 diabetes mellitus    Past Surgical History:  Procedure Laterality Date   YAG LASER APPLICATION Left 01/06/2015    Procedure: YAG LASER APPLICATION;  Surgeon: Jethro Bolus, MD;  Location: [facility];  Service: Ophthalmology;  Laterality: Left;   Social History:  reports that she has never smoked. She has never used smokeless tobacco. She reports that she does not drink alcohol and does not use drugs.  Allergies  Allergen Reactions   Haloperidol Lactate Other (See Comments)    Couldn't urinate anymore    Family History  Problem Relation Age of Onset   Hypertension Father    Diabetes type II Father     Prior to Admission medications   Medication Sig Start Date End Date Taking? Authorizing Provider  albuterol (VENTOLIN HFA) 108 (90 Base) MCG/ACT inhaler Inhale into the lungs. 06/29/20  Yes [provider]  apixaban (ELIQUIS) 5 MG TABS tablet Take 1 tablet (5 mg total) by mouth 2 (two) times daily. 10/10/19  Yes Tat, Onalee Hua, MD  atorvastatin (LIPITOR) 20 MG tablet Take 20 mg by mouth daily. 10/05/20  Yes [provider]  Camphor-Eucalyptus-Menthol (VICKS VAPORUB) 4.7-1.2-2.6 % OINT Apply 1 application topically daily. Apply once to toenails daily   Yes [provider]  cholecalciferol (VITAMIN D3) 10 MCG (400 UNIT) TABS tablet Take 400 Units by mouth.   Yes [provider]  docusate sodium (COLACE) 100 MG capsule Take 200 mg by mouth daily.   Yes [provider]  FEROSUL 325 (65 Fe) MG tablet Take 325 mg by mouth daily with breakfast. 10/05/20  Yes [provider]  gabapentin (NEURONTIN) 400 MG capsule Take 400 mg by mouth at bedtime.   Yes [provider]  latanoprost (XALATAN) 0.005 % ophthalmic solution Place 1 drop into both eyes at bedtime.   Yes [provider]  lisinopril (ZESTRIL) 2.5 MG tablet Take 1 tablet (2.5 mg total) by mouth daily. 10/12/19  Yes Tat, Onalee Hua, MD  loratadine (CLARITIN) 10 MG tablet Take 10 mg by mouth daily.   Yes [provider]  metFORMIN (GLUCOPHAGE) 500 MG tablet Take 500 mg by mouth 2 (two) times daily  with a meal.   Yes [provider]  metoprolol tartrate (LOPRESSOR) 25 MG tablet Take 1 tablet (25 mg total) by mouth 2 (two) times daily. 10/10/19  Yes Tat, Onalee Hua, MD  Multiple Vitamin (MULTIVITAMIN) tablet Take 1 tablet by mouth daily.   Yes [provider]  [facility] powder Apply 1 Application topically 3 (three) times daily. 06/28/22  Yes [provider]  OLANZapine (ZYPREXA) 20 MG tablet Take 1 tablet (20 mg total) by mouth at bedtime. Total of 25 mg at night. Take along with 20 mg tab 05/29/22 07/28/22 Yes Hisada, Barbee Cough, MD  OLANZapine (ZYPREXA) 5 MG tablet Take 1 tablet (5 mg total) by mouth at bedtime. Total of 25 mg at night. Take along with 20 mg tab 05/29/22 07/28/22 Yes Hisada, Barbee Cough, MD  pantoprazole (PROTONIX) 40 MG tablet Take 40 mg by mouth daily.   Yes [provider]  polyethylene glycol powder (GLYCOLAX/MIRALAX) powder Take 17 g by mouth daily as needed for mild constipation or moderate constipation.   Yes [provider]  senna-docusate (SENOKOT-S) 8.6-50 MG tablet Take 2 tablets by mouth at bedtime. 02/23/17  Yes Erick Blinks, MD  vitamin B-12 (CYANOCOBALAMIN) 500 MCG tablet Take 1 tablet (500 mcg total) by mouth daily. 10/10/19  Yes Tat, Onalee Hua, MD  zolpidem (AMBIEN) 5 MG tablet Take 5 mg by mouth at bedtime as needed for sleep.   Yes [provider]    Physical Exam: Vitals:   07/12/22 1230 07/12/22 1337 07/12/22 1354 07/12/22 1608  BP: (!) 102/58 (!) 84/56 (!) 98/58 112/72  Pulse: 93 94  95  Resp: 15 17    Temp:  98.7 F (37.1 C)  98.6 F (37 C)  TempSrc:  Oral  Oral  SpO2: 97% 100%  97%  Weight:      Height:  5' (1.524 m)     General exam: Alert, awake, oriented x 3; in mild distress, obese and ill-appearing. Respiratory system: No using accessory muscles, positive rhonchi, no crackles. Cardiovascular system: Tachycardic, no rubs, no gallops, unable to assess JVD with body habitus. Gastrointestinal system: Abdomen is  obese, nondistended, soft and nontender. No organomegaly or masses felt. Normal bowel sounds heard. Central nervous system: Alert and oriented. No focal neurological deficits. Extremities: No cyanosis or clubbing. Skin: No petechiae. Psychiatry: Judgement and insight appear normal. Mood & affect appropriate.   Data Reviewed: Lactic acid: 1.5 Comprehensive metabolic panel: Sodium 131, potassium 3.2, chloride 104, bicarb 21, BUN 17, creatinine 116 and GFR 48 CBC: White blood cells 9.8, hemoglobin 10 and platelet count 196 K -ESR: 44 Procalcitonin: 0.23  Assessment and Plan: * Sepsis due to COVID-19 -Patient meeting sepsis at time of admission with source of infection pneumonia; concerning for COVID-19 and superimposed bacterial infection.  Patient with high-grade temperature, tachycardia, tachypnea and expressing low oxygen levels at home. -Positive COVID PCR in the ED -Of rest no hypoxia isolated in house. -Chest x-ray without frank infiltrates; but demonstrating diffuse bronchitic changes. -Follow inflammatory markers, no meeting criteria for remdesivir or Paxlovid at the moment; taking into consideration extensive symptomatic presentation will continue broad spectrum antibiotics and start steroids. -Check cortisol level. -As needed bronchodilators, mucolytic's and  flutter valve has been ordered. -Continue vitamin C and zinc.    Chronic combined systolic and diastolic congestive heart failure -Update 2D echo -BNP was elevated -Follow daily weights and strict intake and output -No crackles appreciated on auscultation.  SVT (supraventricular tachycardia) -Tachycardia present at time of admission -Adequately subsided once metoprolol resumed. -Continue telemetry monitoring -Checking TSH -Continue home Eliquis.  GERD (gastroesophageal reflux disease) -Continue PPI.  Hyperlipidemia -Continue statin.  Type 2 diabetes mellitus -Holding oral hypoglycemic agents while  inpatient -Update A1c -Sliding scale insulin has been ordered.  Hypertension -Soft blood pressure on presentation -Will hold antihypertensive agents except for metoprolol. -Follow-up vital signs and maintain adequate/judicious hydration.  Type 2 diabetes mellitus with nephropathy -Continue to maintain adequate hydration -Follow renal function trend -Holding oral hypoglycemic agents while inpatient -Sliding scale insulin started -Modified carbohydrate diet ordered.  Class II obesity -Body mass index is 39.06 kg/m. -Low-calorie diet and portion control discussed with patient.  Hyponatremia/hypokalemia -In the setting of dehydration and acute pulmonary process from COVID -Continue adequate fluid resuscitation -Replete electrolytes -Check magnesium level -Follow electrolytes trend.  Chronic kidney disease a stage IIIb -Appears to be stable and at baseline currently -Minimize the use of contrast, nephrotoxic agents and avoid hypotension -Maintain adequate hydration -Follow renal function trend.   Advance Care Planning:   Code Status: Full Code   Consults: None  Family Communication: No family at bedside.  Severity of Illness: The appropriate patient status for this patient is INPATIENT. Inpatient status is judged to be reasonable and necessary in order to provide the required intensity of service to ensure the patient's safety. The patient's presenting symptoms, physical exam findings, and initial radiographic and laboratory data in the context of their chronic comorbidities is felt to place them at high risk for further clinical deterioration. Furthermore, it is not anticipated that the patient will be medically stable for discharge from the hospital within 2 midnights of admission.   * I certify that at the point of admission it is my clinical judgment that the patient will require inpatient hospital care spanning beyond 2 midnights from the point of admission due to high  intensity of service, high risk for further deterioration and high frequency of surveillance required.*  Author: Vassie Loll, MD 07/12/2022 5:00 PM  For on call review [email or web address].

## 2022-07-12 NOTE — Assessment & Plan Note (Signed)
-  Tachycardia present at time of admission -Adequately subsided once metoprolol resumed. -Continue telemetry monitoring -Checking TSH -Continue home Eliquis.

## 2022-07-12 NOTE — ED Notes (Signed)
MD at bedside aware of rectal temp and received EKG

## 2022-07-12 NOTE — Progress Notes (Signed)
Elink is following code sepsis 

## 2022-07-12 NOTE — ED Provider Notes (Signed)
[place] EMERGENCY DEPARTMENT AT [facility] Provider Note   CSN: [id number] Arrival date & time: 07/12/22  0747     History  Chief Complaint  Patient presents with   Cough    Maria Pace is a 82 y.o. female.  HPI Presents from nursing facility with concern for cough, congestion, fever.  Onset may have been 2 or 3 days ago.  Seemingly she is taking her medication as directed until today.  With progression of symptoms she was in here for evaluation.  Patient denies focal pain, does endorse generalized weakness, cough.  She is unaware of fever. History is obtained with the patient, and nursing home staff.     Home Medications Prior to Admission medications   Medication Sig Start Date End Date Taking? Authorizing Provider  albuterol (VENTOLIN HFA) 108 (90 Base) MCG/ACT inhaler Inhale into the lungs. 06/29/20   [provider]  apixaban (ELIQUIS) 5 MG TABS tablet Take 1 tablet (5 mg total) by mouth 2 (two) times daily. 10/10/19   Catarina Hartshorn, MD  atorvastatin (LIPITOR) 20 MG tablet Take 20 mg by mouth daily. 10/05/20   [provider]  Camphor-Eucalyptus-Menthol (VICKS VAPORUB) 4.7-1.2-2.6 % OINT Apply 1 application topically daily. Apply once to toenails daily    [provider]  cholecalciferol (VITAMIN D3) 10 MCG (400 UNIT) TABS tablet Take 400 Units by mouth.    [provider]  docusate sodium (COLACE) 100 MG capsule Take 200 mg by mouth daily.    [provider]  FEROSUL 325 (65 Fe) MG tablet Take by mouth. 10/05/20   [provider]  furosemide (LASIX) 20 MG tablet Take 20 mg by mouth.    [provider]  gabapentin (NEURONTIN) 400 MG capsule Take 400 mg by mouth at bedtime.    [provider]  latanoprost (XALATAN) 0.005 % ophthalmic solution Place 1 drop into both eyes at bedtime.    [provider]  lisinopril (ZESTRIL) 2.5 MG tablet Take 1 tablet (2.5 mg total) by mouth daily. 10/12/19    Catarina Hartshorn, MD  loratadine (CLARITIN) 10 MG tablet Take 10 mg by mouth daily.    [provider]  metFORMIN (GLUCOPHAGE) 500 MG tablet Take 500 mg by mouth 2 (two) times daily with a meal.    [provider]  metoprolol tartrate (LOPRESSOR) 25 MG tablet Take 1 tablet (25 mg total) by mouth 2 (two) times daily. 10/10/19   Catarina Hartshorn, MD  Multiple Vitamin (MULTIVITAMIN) tablet Take 1 tablet by mouth daily.    [provider]  OLANZapine (ZYPREXA) 20 MG tablet Take 1 tablet (20 mg total) by mouth at bedtime. Total of 25 mg at night. Take along with 20 mg tab 05/29/22 07/28/22  [name], MD  OLANZapine (ZYPREXA) 5 MG tablet Take 1 tablet (5 mg total) by mouth at bedtime. Total of 25 mg at night. Take along with 20 mg tab 05/29/22 07/28/22  [name], MD  pantoprazole (PROTONIX) 40 MG tablet Take 40 mg by mouth daily.    [provider]  polyethylene glycol powder (GLYCOLAX/MIRALAX) powder Take 17 g by mouth daily as needed for mild constipation or moderate constipation.    [provider]  senna-docusate (SENOKOT-S) 8.6-50 MG tablet Take 2 tablets by mouth at bedtime. 02/23/17   Erick Blinks, MD  vitamin B-12 (CYANOCOBALAMIN) 500 MCG tablet Take 1 tablet (500 mcg total) by mouth daily. 10/10/19   Catarina Hartshorn, MD  zolpidem (AMBIEN) 5 MG  tablet Take 5 mg by mouth at bedtime as needed for sleep.    [provider]      Allergies    Haloperidol lactate    Review of Systems   Review of Systems  All other systems reviewed and are negative.   Physical Exam Updated Vital Signs BP (!) 101/59   Pulse (!) 108   Temp 99.3 F (37.4 C) (Oral)   Resp 18   Ht 5' (1.524 m)   Wt 90.7 kg   SpO2 95%   BMI 39.06 kg/m  Physical Exam Vitals and nursing note reviewed.  Constitutional:      Appearance: She is well-developed. She is obese. She is ill-appearing.  HENT:     Head: Normocephalic and atraumatic.  Eyes:     Conjunctiva/sclera: Conjunctivae  normal.  Cardiovascular:     Rate and Rhythm: Regular rhythm. Tachycardia present.  Pulmonary:     Effort: Pulmonary effort is normal. No respiratory distress.     Breath sounds: Normal breath sounds. No stridor.  Abdominal:     General: There is no distension.     Tenderness: There is no abdominal tenderness. There is no guarding.  Skin:    General: Skin is warm.  Neurological:     Mental Status: She is alert and oriented to person, place, and time.     Cranial Nerves: No cranial nerve deficit.  Psychiatric:        Mood and Affect: Mood normal.     ED Results / Procedures / Treatments   Labs (all labs ordered are listed, but only abnormal results are displayed) Labs Reviewed  RESP PANEL BY RT-PCR (RSV, FLU A&B, COVID)  [id number] - Abnormal; Notable for the following components:      Result Value   SARS Coronavirus 2 by RT PCR POSITIVE (*)    All other components within normal limits  COMPREHENSIVE METABOLIC PANEL - Abnormal; Notable for the following components:   Sodium 131 (*)    Potassium 3.2 (*)    CO2 21 (*)    Glucose, Bld 135 (*)    Creatinine, Ser 1.16 (*)    Calcium 7.9 (*)    Albumin 3.2 (*)    Total Bilirubin 0.2 (*)    GFR, Estimated 48 (*)    All other components within normal limits  CBC WITH DIFFERENTIAL/PLATELET - Abnormal; Notable for the following components:   RBC 3.51 (*)    Hemoglobin 10.0 (*)    HCT 31.0 (*)    Monocytes Absolute 1.2 (*)    All other components within normal limits  PROTIME-INR - Abnormal; Notable for the following components:   Prothrombin Time 16.2 (*)    INR 1.3 (*)    All other components within normal limits  URINALYSIS, W/ REFLEX TO CULTURE (INFECTION SUSPECTED) - Abnormal; Notable for the following components:   Leukocytes,Ua SMALL (*)    Bacteria, UA RARE (*)    All other components within normal limits  BRAIN NATRIURETIC PEPTIDE - Abnormal; Notable for the following components:   B Natriuretic Peptide 897.0 (*)    All  other components within normal limits  CULTURE, BLOOD (ROUTINE X 2)  CULTURE, BLOOD (ROUTINE X 2)  LACTIC ACID, PLASMA  APTT  LACTIC ACID, PLASMA    EKG EKG Interpretation  Date/Time:  Tuesday July 12 2022 08:04:26 EDT Ventricular Rate:  161 PR Interval:  138 QRS Duration: 130 QT Interval:  324 QTC Calculation: 531 R Axis:   134 Text  Interpretation: Atrial fibrillation Abnormal ECG Confirmed by Gerhard Munch [phone]) on 07/12/2022 8:23:57 AM  Radiology DG Chest Port 1 View  Result Date: 07/12/2022 CLINICAL DATA:  Possible sepsis EXAM: PORTABLE CHEST 1 VIEW COMPARISON:  Chest radiograph 07/01/2020 FINDINGS: No pleural effusion. No pneumothorax. Cardiomegaly. No focal airspace opacity. No radiographically apparent displaced rib fractures. Visualized upper abdomen is unremarkable. Degenerative changes of the bilateral AC and glenohumeral joints. IMPRESSION: Cardiomegaly.  No focal airspace opacity. Electronically Signed   By: Lorenza Cambridge M.D.   On: 07/12/2022 08:16    Procedures Procedures    Medications Ordered in ED Medications  lactated ringers infusion (150 mL/hr Intravenous New Bag/Given 07/12/22 0944)  vancomycin (VANCOREADY) IVPB 2000 mg/400 mL (has no administration in time range)  metoprolol tartrate (LOPRESSOR) injection 5 mg (has no administration in time range)  metoprolol tartrate (LOPRESSOR) tablet 25 mg (has no administration in time range)  vancomycin (VANCOREADY) IVPB 500 mg/100 mL (has no administration in time range)  ceFEPIme (MAXIPIME) 2 g in sodium chloride 0.9 % 100 mL IVPB (has no administration in time range)  nirmatrelvir/ritonavir (PAXLOVID) 3 tablet (has no administration in time range)  lactated ringers bolus 1,000 mL (0 mLs Intravenous Stopped 07/12/22 0913)    And  lactated ringers bolus 500 mL (0 mLs Intravenous Stopped 07/12/22 1011)  ceFEPIme (MAXIPIME) 2 g in sodium chloride 0.9 % 100 mL IVPB (0 g Intravenous Stopped 07/12/22 0913)   metroNIDAZOLE (FLAGYL) IVPB 500 mg (500 mg Intravenous New Bag/Given 07/12/22 0913)  acetaminophen (TYLENOL) tablet 1,000 mg (1,000 mg Oral Given 07/12/22 0850)    ED Course/ Medical Decision Making/ A&P                             Medical Decision Making Adult female, multiple medical issues including schizophrenia, hypertension, diabetes presents from nursing facility with concern for fever, cough, congestion.  Patient meets SIRS criteria on arrival, received broad-spectrum antibiotics, fluid resuscitation.  On initial monitor the patient had episodes of SVT, interspersed with sinus rhythm, rate variable, 110, 160, abnormal Pulse ox 96% room air normal After resuscitation with fluids, antibiotics, provision of beta-blocker, patient's heart rate reduced and remained in the 105, 110 range, consistent with prior. Patient's evaluation here notable for COVID-positive status, labs otherwise generally unremarkable with no lactic acidosis, no leukocytosis, mild demonstration of renal dysfunction. Given concern for sepsis, COVID, the patient did require admission for further monitoring management.  Amount and/or Complexity of Data Reviewed External Data Reviewed: notes. Labs: ordered. Decision-making details documented in ED Course. Radiology: ordered and independent interpretation performed. Decision-making details documented in ED Course. ECG/medicine tests: ordered and independent interpretation performed. Decision-making details documented in ED Course.  Risk OTC drugs. Prescription drug management. Decision regarding hospitalization.  Critical Care Total time providing critical care: 40 minutes           Final Clinical Impression(s) / ED Diagnoses Final diagnoses:  COVID  Severe sepsis     Gerhard Munch, MD 07/12/22 1052

## 2022-07-13 ENCOUNTER — Inpatient Hospital Stay (HOSPITAL_COMMUNITY): Payer: Medicare (Managed Care)

## 2022-07-13 ENCOUNTER — Telehealth: Payer: Self-pay | Admitting: Psychiatry

## 2022-07-13 ENCOUNTER — Encounter: Payer: No Typology Code available for payment source | Admitting: Psychiatry

## 2022-07-13 DIAGNOSIS — I5042 Chronic combined systolic (congestive) and diastolic (congestive) heart failure: Secondary | ICD-10-CM

## 2022-07-13 DIAGNOSIS — U071 COVID-19: Secondary | ICD-10-CM | POA: Diagnosis not present

## 2022-07-13 DIAGNOSIS — A4189 Other specified sepsis: Secondary | ICD-10-CM | POA: Diagnosis not present

## 2022-07-13 LAB — ECHOCARDIOGRAM COMPLETE
Area-P 1/2: 4.68 cm2
Height: 60 in
MV M vel: 3.51 m/s
MV Peak grad: 49.3 mmHg
Radius: 0.4 cm
S' Lateral: 5.1 cm
Weight: 3200 oz

## 2022-07-13 LAB — COMPREHENSIVE METABOLIC PANEL
ALT: 20 U/L (ref 0–44)
AST: 33 U/L (ref 15–41)
Albumin: 3 g/dL — ABNORMAL LOW (ref 3.5–5.0)
Alkaline Phosphatase: 56 U/L (ref 38–126)
Anion gap: 9 (ref 5–15)
BUN: 17 mg/dL (ref 8–23)
CO2: 20 mmol/L — ABNORMAL LOW (ref 22–32)
Calcium: 8.1 mg/dL — ABNORMAL LOW (ref 8.9–10.3)
Chloride: 105 mmol/L (ref 98–111)
Creatinine, Ser: 0.96 mg/dL (ref 0.44–1.00)
GFR, Estimated: >60 mL/min (ref >60)
Glucose, Bld: 146 mg/dL — ABNORMAL HIGH (ref 70–99)
Potassium: 4.5 mmol/L (ref 3.5–5.1)
Sodium: 134 mmol/L — ABNORMAL LOW (ref 135–145)
Total Bilirubin: 1 mg/dL (ref 0.3–1.2)
Total Protein: 6.4 g/dL — ABNORMAL LOW (ref 6.5–8.1)

## 2022-07-13 LAB — CBC
HCT: 32.4 % — ABNORMAL LOW (ref 36.0–46.0)
Hemoglobin: 10 g/dL — ABNORMAL LOW (ref 12.0–15.0)
MCH: 28.3 pg (ref 26.0–34.0)
MCHC: 30.9 g/dL (ref 30.0–36.0)
MCV: 91.8 fL (ref 80.0–100.0)
Platelets: 138 10*3/uL — ABNORMAL LOW (ref 150–400)
RBC: 3.53 MIL/uL — ABNORMAL LOW (ref 3.87–5.11)
RDW: 15.7 % — ABNORMAL HIGH (ref 11.5–15.5)
WBC: 8.8 10*3/uL (ref 4.0–10.5)
nRBC: 0 % (ref 0.0–0.2)

## 2022-07-13 LAB — CULTURE, BLOOD (ROUTINE X 2)

## 2022-07-13 LAB — GLUCOSE, CAPILLARY
Glucose-Capillary: 134 mg/dL — ABNORMAL HIGH (ref 70–99)
Glucose-Capillary: 222 mg/dL — ABNORMAL HIGH (ref 70–99)
Glucose-Capillary: 156 mg/dL — ABNORMAL HIGH (ref 70–99)
Glucose-Capillary: 282 mg/dL — ABNORMAL HIGH (ref 70–99)

## 2022-07-13 LAB — C-REACTIVE PROTEIN: CRP: 5.3 mg/dL — ABNORMAL HIGH (ref <1.0)

## 2022-07-13 LAB — MAGNESIUM: Magnesium: 1.6 mg/dL — ABNORMAL LOW (ref 1.7–2.4)

## 2022-07-13 LAB — FERRITIN: Ferritin: 28 ng/mL (ref 11–307)

## 2022-07-13 LAB — TSH: TSH: 0.447 u[IU]/mL (ref 0.350–4.500)

## 2022-07-13 MED ORDER — METOPROLOL TARTRATE 25 MG PO TABS
25.0000 mg | ORAL_TABLET | Freq: Two times a day (BID) | ORAL | Status: DC
  Administered 2022-07-13 – 2022-07-14 (×2): 25 mg via ORAL
  Filled 2022-07-13 (×2): qty 1

## 2022-07-13 MED ORDER — METOPROLOL TARTRATE 25 MG PO TABS
12.5000 mg | ORAL_TABLET | Freq: Once | ORAL | Status: AC
  Administered 2022-07-13: 12.5 mg via ORAL
  Filled 2022-07-13: qty 1

## 2022-07-13 MED ORDER — ADENOSINE 6 MG/2ML IV SOLN: 6.0000 mg | Freq: Once | INTRAVENOUS | Status: DC

## 2022-07-13 MED ORDER — MAGNESIUM SULFATE 2 GM/50ML IV SOLN
2.0000 g | Freq: Once | INTRAVENOUS | Status: AC
  Administered 2022-07-13: 2 g via INTRAVENOUS

## 2022-07-13 MED ORDER — METOPROLOL TARTRATE 5 MG/5ML IV SOLN
5.0000 mg | INTRAVENOUS | Status: DC | PRN
  Administered 2022-07-13 (×3): 5 mg via INTRAVENOUS
  Filled 2022-07-13 (×3): qty 5

## 2022-07-13 NOTE — Progress Notes (Signed)
PROGRESS NOTE    Maria Pace  ZOX:[id number] DOB: 11-27-1940 DOA: 07/12/2022 PCP: Benetta Spar, MD   Brief Narrative:     Maria Pace is a 82 y.o. female with medical history significant of bipolar disorder/schizophrenia, irregular heartbeat/SVT on chronic Eliquis, chronic systolic/diastolic heart failure, type 2 diabetes, hypertension, hyperlipidemia and GERD; who presented to the hospital secondary to general malaise, shortness of breath, coughing spells, fever and per patient reports oxygen desaturation with activity.   Assessment & Plan:   Principal Problem:   Sepsis due to COVID-19 Active Problems:   Hypertension   Type 2 diabetes mellitus   Hyperlipidemia   GERD (gastroesophageal reflux disease)   SVT (supraventricular tachycardia)   Chronic combined systolic and diastolic congestive heart failure  Assessment and Plan:  Sepsis due to COVID-19 -Patient meeting sepsis at time of admission with source of infection pneumonia; concerning for COVID-19 and superimposed bacterial infection.  Patient with high-grade temperature, tachycardia, tachypnea and expressing low oxygen levels at home. -Positive COVID PCR in the ED -Of rest no hypoxia isolated in house. -Chest x-ray without frank infiltrates; but demonstrating diffuse bronchitic changes. -Follow inflammatory markers, no meeting criteria for remdesivir or Paxlovid at the moment; taking into consideration extensive symptomatic presentation will continue broad spectrum antibiotics and start steroids. -Check cortisol level. -As needed bronchodilators, mucolytic's and flutter valve has been ordered. -Continue vitamin C and zinc. -Ongoing fevers noted, continue to monitor       Chronic combined systolic and diastolic congestive heart failure -Update 2D echo -BNP was elevated -Follow daily weights and strict intake and output -No crackles appreciated on auscultation.   SVT (supraventricular  tachycardia) -Tachycardia present at time of admission -Continues to have periods of SVT, metoprolol increased to 25 mg twice daily -Continue telemetry monitoring -TSH 0.447 -Continue home Eliquis.   GERD (gastroesophageal reflux disease) -Continue PPI.   Hyperlipidemia -Continue statin.   Type 2 diabetes mellitus -Holding oral hypoglycemic agents while inpatient -Update A1c -Sliding scale insulin has been ordered.   Hypertension -Soft blood pressure on presentation -Will hold antihypertensive agents except for metoprolol. -Follow-up vital signs and maintain adequate/judicious hydration.   Type 2 diabetes mellitus with nephropathy -Continue to maintain adequate hydration -Follow renal function trend -Holding oral hypoglycemic agents while inpatient -Sliding scale insulin started -Modified carbohydrate diet ordered. -A1c 6.2%   Class II obesity -Body mass index is 39.06 kg/m. -Low-calorie diet and portion control discussed with patient.   Hyponatremia/hypokalemia -In the setting of dehydration and acute pulmonary process from COVID -Continue adequate fluid resuscitation -Replete electrolytes -Check magnesium level -Follow electrolytes trend.   Chronic kidney disease a stage IIIb -Appears to be stable and at baseline currently -Minimize the use of contrast, nephrotoxic agents and avoid hypotension -Maintain adequate hydration -Follow renal function trend.  Hypomagnesemia -Replete and follow in a.m.    DVT prophylaxis: Eliquis Code Status: Full Family Communication: None at bedside Disposition Plan:  Status is: Inpatient Remains inpatient appropriate because: Need for IV antibiotics.   Consultants:  None  Procedures:  None  Antimicrobials:  Anti-infectives (From admission, onward)    Start     Dose/Rate Route Frequency Ordered Stop   07/14/22 1000  vancomycin (VANCOREADY) IVPB 1250 mg/250 mL        1,250 mg 166.7 mL/hr over 90 Minutes Intravenous  Every 48 hours 07/12/22 1209     07/12/22 2200  ceFEPIme (MAXIPIME) 2 g in sodium chloride 0.9 % 100 mL IVPB  2 g 200 mL/hr over 30 Minutes Intravenous Every 12 hours 07/12/22 0947     07/12/22 1400  vancomycin (VANCOREADY) IVPB 500 mg/100 mL  Status:  Discontinued        500 mg 100 mL/hr over 60 Minutes Intravenous Every 24 hours 07/12/22 0947 07/12/22 1209   07/12/22 1100  nirmatrelvir/ritonavir (PAXLOVID) 3 tablet  Status:  Discontinued        3 tablet Oral 2 times daily 07/12/22 1047 07/12/22 1703   07/12/22 0830  vancomycin (VANCOREADY) IVPB 2000 mg/400 mL        2,000 mg 200 mL/hr over 120 Minutes Intravenous  Once 07/12/22 0822 07/12/22 1353   07/12/22 0815  ceFEPIme (MAXIPIME) 2 g in sodium chloride 0.9 % 100 mL IVPB        2 g 200 mL/hr over 30 Minutes Intravenous  Once 07/12/22 0804 07/12/22 0913   07/12/22 0815  metroNIDAZOLE (FLAGYL) IVPB 500 mg        500 mg 100 mL/hr over 60 Minutes Intravenous  Once 07/12/22 0804 07/12/22 1057   07/12/22 0815  vancomycin (VANCOCIN) IVPB 1000 mg/200 mL premix  Status:  Discontinued        1,000 mg 200 mL/hr over 60 Minutes Intravenous  Once 07/12/22 0804 07/12/22 1610      Subjective: Patient seen and evaluated today with no new acute complaints or concerns. No acute concerns or events noted overnight.  She denies any chest pain or palpitations.  Reports seeing some people at the corner of her room that are not there.  Objective: Vitals:   07/13/22 0012 07/13/22 0510 07/13/22 0811 07/13/22 1019  BP: 106/60 116/64  112/69  Pulse: 71 68 66 84  Resp: 16 18 (!) 22 18  Temp: 98.6 F (37 C) 98.3 F (36.8 C)  98.2 F (36.8 C)  TempSrc: Oral   Oral  SpO2: 98% 95% 96% 98%  Weight:      Height:        Intake/Output Summary (Last 24 hours) at 07/13/2022 1102 Last data filed at 07/13/2022 0900 Gross per 24 hour  Intake 2187.29 ml  Output 2800 ml  Net -612.71 ml   Filed Weights   07/12/22 0801  Weight: 90.7 kg     Examination:  General exam: Appears calm and comfortable  Respiratory system: Clear to auscultation. Respiratory effort normal. Cardiovascular system: S1 & S2 heard, RRR.  Gastrointestinal system: Abdomen is soft Central nervous system: Alert and awake Extremities: No edema Skin: No significant lesions noted Psychiatry: Flat affect.    Data Reviewed: I have personally reviewed following labs and imaging studies  CBC: Recent Labs  Lab 07/12/22 0828 07/13/22 0500  WBC 9.8 8.8  NEUTROABS 7.7  --   HGB 10.0* 10.0*  HCT 31.0* 32.4*  MCV 88.3 91.8  PLT 196 138*   Basic Metabolic Panel: Recent Labs  Lab 07/12/22 0828 07/13/22 0500  NA 131* 134*  K 3.2* 4.5  CL 104 105  CO2 21* 20*  GLUCOSE 135* 146*  BUN 17 17  CREATININE 1.16* 0.96  CALCIUM 7.9* 8.1*  MG  --  1.6*   GFR: Estimated Creatinine Clearance: 47.7 mL/min (by C-G formula based on SCr of 0.96 mg/dL). Liver Function Tests: Recent Labs  Lab 07/12/22 0828 07/13/22 0500  AST 21 33  ALT 19 20  ALKPHOS 62 56  BILITOT 0.2* 1.0  PROT 6.6 6.4*  ALBUMIN 3.2* 3.0*   No results for input(s): "LIPASE", "AMYLASE" in the last 168  hours. No results for input(s): "AMMONIA" in the last 168 hours. Coagulation Profile: Recent Labs  Lab 07/12/22 0828  INR 1.3*   Cardiac Enzymes: No results for input(s): "CKTOTAL", "CKMB", "CKMBINDEX", "TROPONINI" in the last 168 hours. BNP (last 3 results) No results for input(s): "PROBNP" in the last 8760 hours. HbA1C: Recent Labs    07/12/22 1424  HGBA1C 6.2*   CBG: Recent Labs  Lab 07/12/22 1603 07/12/22 2113 07/13/22 0801  GLUCAP 104* 151* 134*   Lipid Profile: No results for input(s): "CHOL", "HDL", "LDLCALC", "TRIG", "CHOLHDL", "LDLDIRECT" in the last 72 hours. Thyroid Function Tests: Recent Labs    07/13/22 0500  TSH 0.447   Anemia Panel: Recent Labs    07/13/22 0500  FERRITIN 28   Sepsis Labs: Recent Labs  Lab 07/12/22 0828 07/12/22 1515   PROCALCITON  --  0.23  LATICACIDVEN 1.5  --     Recent Results (from the past 240 hour(s))  Resp panel by RT-PCR (RSV, Flu A&B, Covid) Anterior Nasal Swab     Status: Abnormal   Collection Time: 07/12/22  8:10 AM   Specimen: Anterior Nasal Swab  Result Value Ref Range Status   SARS Coronavirus 2 by RT PCR POSITIVE (A) NEGATIVE Final    Comment: (NOTE) SARS-CoV-2 target nucleic acids are DETECTED.  The SARS-CoV-2 RNA is generally detectable in upper respiratory specimens during the acute phase of infection. Positive results are indicative of the presence of the identified virus, but do not rule out bacterial infection or co-infection with other pathogens not detected by the test. Clinical correlation with patient history and other diagnostic information is necessary to determine patient infection status. The expected result is Negative.  Fact Sheet for Patients: BloggerCourse.com  Fact Sheet for Healthcare Providers: SeriousBroker.it  This test is not yet approved or cleared by the [place] FDA and  has been authorized for detection and/or diagnosis of SARS-CoV-2 by FDA under an Emergency Use Authorization (EUA).  This EUA will remain in effect (meaning this test can be used) for the duration of  the COVID-19 declaration under Section 564(b)(1) of the A ct, 21 U.S.C. section 360bbb-3(b)(1), unless the authorization is terminated or revoked sooner.     Influenza A by PCR NEGATIVE NEGATIVE Final   Influenza B by PCR NEGATIVE NEGATIVE Final    Comment: (NOTE) The Xpert Xpress SARS-CoV-2/FLU/RSV plus assay is intended as an aid in the diagnosis of influenza from Nasopharyngeal swab specimens and should not be used as a sole basis for treatment. Nasal washings and aspirates are unacceptable for Xpert Xpress SARS-CoV-2/FLU/RSV testing.  Fact Sheet for Patients: BloggerCourse.com  Fact Sheet for  Healthcare Providers: SeriousBroker.it  This test is not yet approved or cleared by the [place] FDA and has been authorized for detection and/or diagnosis of SARS-CoV-2 by FDA under an Emergency Use Authorization (EUA). This EUA will remain in effect (meaning this test can be used) for the duration of the COVID-19 declaration under Section 564(b)(1) of the Act, 21 U.S.C. section 360bbb-3(b)(1), unless the authorization is terminated or revoked.     Resp Syncytial Virus by PCR NEGATIVE NEGATIVE Final    Comment: (NOTE) Fact Sheet for Patients: BloggerCourse.com  Fact Sheet for Healthcare Providers: SeriousBroker.it  This test is not yet approved or cleared by the [place] FDA and has been authorized for detection and/or diagnosis of SARS-CoV-2 by FDA under an Emergency Use Authorization (EUA). This EUA will remain in effect (meaning this test can be used) for the  duration of the COVID-19 declaration under Section 564(b)(1) of the Act, 21 U.S.C. section 360bbb-3(b)(1), unless the authorization is terminated or revoked.  Performed at [facility], [address] [place], [place] [address]   Blood Culture (routine x 2)     Status: None (Preliminary result)   Collection Time: 07/12/22  8:23 AM   Specimen: BLOOD LEFT HAND  Result Value Ref Range Status   Specimen Description BLOOD LEFT HAND  Final   Special Requests   Final    BOTTLES DRAWN AEROBIC AND ANAEROBIC Blood Culture results may not be optimal due to an excessive volume of blood received in culture bottles   Culture   Final    NO GROWTH < 24 HOURS Performed at [facility], [address], [place], [place] [address]    Report Status PENDING  Incomplete  Blood Culture (routine x 2)     Status: None (Preliminary result)   Collection Time: 07/12/22  8:23 AM   Specimen: BLOOD RIGHT ARM  Result Value Ref Range Status   Specimen Description  BLOOD RIGHT ARM  Final   Special Requests   Final    BOTTLES DRAWN AEROBIC AND ANAEROBIC Blood Culture adequate volume   Culture   Final    NO GROWTH < 24 HOURS Performed at [facility], [address] [place], [place] [address]    Report Status PENDING  Incomplete  MRSA Next Gen by PCR, Nasal     Status: None   Collection Time: 07/12/22  6:55 PM   Specimen: Nasal Mucosa; Nasal Swab  Result Value Ref Range Status   MRSA by PCR Next Gen NOT DETECTED NOT DETECTED Final    Comment: (NOTE) The GeneXpert MRSA Assay (FDA approved for NASAL specimens only), is one component of a comprehensive MRSA colonization surveillance program. It is not intended to diagnose MRSA infection nor to guide or monitor treatment for MRSA infections. Test performance is not FDA approved in patients less than 80 years old. Performed at [facility], [address] [place], [place] [address]          Radiology Studies: [facility] 1 View  Result Date: 07/12/2022 CLINICAL DATA:  Possible sepsis EXAM: PORTABLE CHEST 1 VIEW COMPARISON:  Chest radiograph 07/01/2020 FINDINGS: No pleural effusion. No pneumothorax. Cardiomegaly. No focal airspace opacity. No radiographically apparent displaced rib fractures. Visualized upper abdomen is unremarkable. Degenerative changes of the bilateral AC and glenohumeral joints. IMPRESSION: Cardiomegaly.  No focal airspace opacity. Electronically Signed   By: Lorenza Cambridge M.D.   On: 07/12/2022 08:16        Scheduled Meds:  apixaban  5 mg Oral BID   ascorbic acid  500 mg Oral Daily   atorvastatin  20 mg Oral Daily   budesonide (PULMICORT) nebulizer solution  0.5 mg Nebulization BID   docusate sodium  200 mg Oral Daily   gabapentin  400 mg Oral QHS   insulin aspart  0-5 Units Subcutaneous QHS   insulin aspart  0-9 Units Subcutaneous TID WC   latanoprost  1 drop Both Eyes QHS   methylPREDNISolone (SOLU-MEDROL) injection  0.5 mg/kg Intravenous Q12H   Followed by    Melene Muller ON 07/15/2022] predniSONE  50 mg Oral Daily   metoprolol tartrate  25 mg Oral BID   multivitamin with minerals  1 tablet Oral Daily   OLANZapine  25 mg Oral QHS   pantoprazole  40 mg Oral Daily   zinc sulfate  220 mg Oral Daily  Continuous Infusions:  ceFEPime (MAXIPIME) IV 2 g (07/12/22 2229)   magnesium sulfate bolus IVPB 2 g (07/13/22 1031)   [START ON 07/14/2022] vancomycin       LOS: 1 day    Time spent: 35 minutes    Jenyfer Trawick Hoover Brunette, DO [facility]  If 7PM-7AM, please contact night-coverage [email or web address] 07/13/2022, 11:02 AM

## 2022-07-13 NOTE — Progress Notes (Signed)
Pt is currently refusing her tele monitor and all meds.  She has been educated by myself and her RN several times about situation. MD is aware.

## 2022-07-13 NOTE — Progress Notes (Signed)
This encounter was created in error - please disregard.

## 2022-07-13 NOTE — Progress Notes (Signed)
   07/13/22 1019  Vitals  Temp 98.2 F (36.8 C)  Temp Source Oral  BP 112/69  MAP (mmHg) 83  BP Location Left Arm  BP Method Automatic  Patient Position (if appropriate) Lying  Pulse Rate 84  Pulse Rate Source Dinamap  Resp 18  Level of Consciousness  Level of Consciousness Alert  MEWS COLOR  MEWS Score Color Green  Oxygen Therapy  SpO2 98 %  O2 Device Room Air  MEWS Score  MEWS Temp 0  MEWS Systolic 0  MEWS Pulse 0  MEWS RR 0  MEWS LOC 0  MEWS Score 0  Provider Notification  Provider Name/Title Dr Margo Aye  Date Provider Notified 07/13/22  Time Provider Notified 1020  Method of Notification Page  Notification Reason Other (Comment) (HR 120*-150's SVT)  Provider response See new orders  Date of Provider Response 07/13/22  Time of Provider Response 1025

## 2022-07-13 NOTE — Telephone Encounter (Signed)
Sent a video visit link through Epic, but the patient didn't sign in. According to the chart, she was admitted yesterday. Will cancel the appointment this time.

## 2022-07-13 NOTE — Progress Notes (Signed)
After this RT had scanned patient and placed medicine in nebulizer cup and put on patient's face she took it off her face and said she wasn't going to take it. Will waste med. RN in room at this time.
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
## 2022-07-13 NOTE — TOC Progression Note (Signed)
  Transition of Care [facility]) Screening Note   Patient Details  Name: Maria Pace Date of Birth: January 11, 1941   Transition of Care [facility]) CM/SW Contact:    Leitha Bleak, RN Phone Number: 07/13/2022, 2:57 PM  Patient is from [facility] care home. Continuing medical workup.   Transition of Care [facility]) has reviewed patient and no TOC needs have been identified at this time. We will continue to monitor patient advancement through interdisciplinary progression rounds. If new patient transition needs arise, please place a TOC consult.    Expected Discharge Plan: Assisted Living Barriers to Discharge: Continued Medical Work up  Expected Discharge Plan and Services      Social Determinants of Health (SDOH) Interventions SDOH Screenings   Food Insecurity: No Food Insecurity (07/12/2022)  Housing: Low Risk  (07/12/2022)  Transportation Needs: No Transportation Needs (07/12/2022)  Utilities: Not At Risk (07/12/2022)  Depression (PHQ2-9): High Risk (09/06/2021)  Tobacco Use: Low Risk  (07/12/2022)    Readmission Risk Interventions     No data to display

## 2022-07-13 NOTE — Progress Notes (Signed)
Pharmacy Antibiotic Note  Maria Pace is a 82 y.o. female admitted on 07/12/2022 with sepsis.  Pharmacy has been consulted for vancomycin and cefepime dosing.   Patient is afebrile this morning, wbc normal at 8. Renal function normal. Covid positive. Cultures no growth.   Plan: Vanc  IV q48 hours - MRSA pcr negative could stop vancomycin and narrow treatment Cefepime: 2 g Q 12 hrs  Height: 5' (152.4 cm) Weight: 90.7 kg (200 lb) IBW/kg (Calculated) : 45.5  Temp (24hrs), Avg:98.6 F (37 C), Min:98.2 F (36.8 C), Max:99 F (37.2 C)  Recent Labs  Lab 07/12/22 0828 07/13/22 0500  WBC 9.8 8.8  CREATININE 1.16* 0.96  LATICACIDVEN 1.5  --      Estimated Creatinine Clearance: 47.7 mL/min (by C-G formula based on SCr of 0.96 mg/dL).    Allergies  Allergen Reactions   Haloperidol Lactate Other (See Comments)    Couldn't urinate anymore    Antimicrobials this admission: Vancomycin 4/16 >>  Cefepime 4/16 >>  Flagyl X 1   Microbiology results: 4/16 bld x 2: ngtd 4/16 MRSA pcr: neg 4/16 covid +  Thank you for allowing pharmacy to be a part of this patient's care.  Sheppard Coil PharmD., BCPS Clinical Pharmacist 07/13/2022 10:52 AM

## 2022-07-13 NOTE — Progress Notes (Signed)
Echocardiogram 2D Echocardiogram has been performed.  Maria Pace 07/13/2022, 9:28 AM

## 2022-07-13 NOTE — Progress Notes (Signed)
At approx. 1920: Tele called saying that pt.'s HR was up into the 180's. Went to assess pt., pt. Lying in bed with drawing on a piece of paper, calm. Asked pt. If she was having any chest pain or feeling okay, pt. Denies chest pain and any other symptoms. BP: 154/94 HR: 155 RR: 18 O2 95 RA, Temp: 98.4 oral, denies pain. Gave PRN lopressor IV, notified MD. Reassessed pt., HR still high at 148. Attempted to give another dose of IV lopressor, however, pt. Refused stating, "I don't want that, don't even come near me with that. It's too much." Assured patient and educated/showed pt. Medication and amount and explained what the medication is for. Pt. Finally agreed. While, this nurse was pushing med in IV, pt. Attempted to pull out IV, instructed pt,. To please not pull out IV because it is needed for medication. Pt. Left IV alone. After giving PRN IV lopressor, pt. Started pulling at telemetry leads stating, "I want these off, I don't want this on anymore, I don't like it." Pt. Educated on purpose of telemetry and pt. Agreed to keep it on. Charge nurse notified of situation. MD notified also. Tele called back again saying pt. Was off the monitor, went to assess pt., telemetry box turned off. Turned telemetry back and instructed pt. To please not turn monitor off that it is important for [place] to monitor HR and rhythm for abnormalities. Pt. Nodded her head in agreeance. Shortly after, was called again by tele stating that pt. Is showing all leads off. Went to assess pt., pt. Has taken all leads and box off and placed near the end of the bed. Pt. Stated, "I told you, I don't want that thing, I don't need it. " Pt. Educated and explained again the importance. MD notified, charge nurse notified. Charge nurse came to bedside to try to educate and explain to pt the reason for the monitor, however, pt. Still very adement that she doesn't want monitor on. MD requested for EKG, EKG attempted with charge nurse at bedside, pt. Giving  a lot of difficulty, not want nurses to do EKG. Pt. Was talked to and talked as to what the purpose of the EKG is, pt. finally Allowed EKG to be performed. MD notified. EKG in pt.'s chart.   Pt. Still currently refusing tele monitor.   Around 10:00pm: Went back in to administer medications. Pt. Wanted to be given a bath. Complete bath given, all bedding changed and gown. Asked pt. Again if nurse can place pt. On telemetry, pt. Agreed. Placed pt. Back on telemetry. HR 87. Charge nurse and MD notified. Pt. York Spaniel that she was scared of the telemetry monitor because she thought it was going to explode. Nurse provided educated regarding  monitor and was okay to allow nurse to place heart monitor on pt. All safety measures in place. Pt. Sitting in bed with paper, drawing. Pt. States she feels so much better now. Call bell and belongings within reach.

## 2022-07-13 NOTE — Progress Notes (Signed)
Patient hr 130-200's this evening, alarms going off in room was upset. B/P  114/93,Dr Sherryll Burger notified.New orders received ,and given , emotional support also provided to patient. Noises controlled and silenced in room, allowed patient to voice her concerns,patient became more relaxed, asked I could provide her with a pen and paper she stated" I love to draw", paper and pen provided. Plan of care on going.

## 2022-07-13 NOTE — Progress Notes (Signed)
   07/13/22 1512  Vitals  Temp 98.4 F (36.9 C)  Temp Source Oral  BP 114/83  MAP (mmHg) 93  BP Location Left Arm  BP Method Automatic  Patient Position (if appropriate) Lying  Pulse Rate (!) 140  Pulse Rate Source Dinamap  Resp 19  MEWS COLOR  MEWS Score Color Yellow  Oxygen Therapy  SpO2 98 %  O2 Device Room Air  MEWS Score  MEWS Temp 0  MEWS Systolic 0  MEWS Pulse 3  MEWS RR 0  MEWS LOC 0  MEWS Score 3  Provider Notification  Provider Name/Title Dr Sherryll Burger  Date Provider Notified 07/13/22  Time Provider Notified 1516  Method of Notification Page  Notification Reason Critical Result (HR 140'S)  Date Critical Result Received 07/13/22  Time Critical Result Received 1517  Date of Provider Response 07/13/22

## 2022-07-14 DIAGNOSIS — A4189 Other specified sepsis: Secondary | ICD-10-CM | POA: Diagnosis not present

## 2022-07-14 DIAGNOSIS — U071 COVID-19: Secondary | ICD-10-CM | POA: Diagnosis not present

## 2022-07-14 LAB — CBC
HCT: 33 % — ABNORMAL LOW (ref 36.0–46.0)
Hemoglobin: 10.5 g/dL — ABNORMAL LOW (ref 12.0–15.0)
MCH: 28.6 pg (ref 26.0–34.0)
MCHC: 31.8 g/dL (ref 30.0–36.0)
MCV: 89.9 fL (ref 80.0–100.0)
Platelets: 211 10*3/uL (ref 150–400)
RBC: 3.67 MIL/uL — ABNORMAL LOW (ref 3.87–5.11)
RDW: 15.7 % — ABNORMAL HIGH (ref 11.5–15.5)
WBC: 12.7 10*3/uL — ABNORMAL HIGH (ref 4.0–10.5)
nRBC: 0 % (ref 0.0–0.2)

## 2022-07-14 LAB — MAGNESIUM: Magnesium: 2 mg/dL (ref 1.7–2.4)

## 2022-07-14 LAB — COMPREHENSIVE METABOLIC PANEL
ALT: 25 U/L (ref 0–44)
AST: 26 U/L (ref 15–41)
Albumin: 3.1 g/dL — ABNORMAL LOW (ref 3.5–5.0)
Alkaline Phosphatase: 61 U/L (ref 38–126)
Anion gap: 10 (ref 5–15)
BUN: 18 mg/dL (ref 8–23)
CO2: 20 mmol/L — ABNORMAL LOW (ref 22–32)
Calcium: 8.3 mg/dL — ABNORMAL LOW (ref 8.9–10.3)
Chloride: 105 mmol/L (ref 98–111)
Creatinine, Ser: 0.94 mg/dL (ref 0.44–1.00)
GFR, Estimated: >60 mL/min (ref >60)
Glucose, Bld: 155 mg/dL — ABNORMAL HIGH (ref 70–99)
Potassium: 4 mmol/L (ref 3.5–5.1)
Sodium: 135 mmol/L (ref 135–145)
Total Bilirubin: <0.1 mg/dL — ABNORMAL LOW (ref 0.3–1.2)
Total Protein: 6.8 g/dL (ref 6.5–8.1)

## 2022-07-14 LAB — GLUCOSE, CAPILLARY
Glucose-Capillary: 157 mg/dL — ABNORMAL HIGH (ref 70–99)
Glucose-Capillary: 217 mg/dL — ABNORMAL HIGH (ref 70–99)

## 2022-07-14 LAB — C-REACTIVE PROTEIN: CRP: 2.1 mg/dL — ABNORMAL HIGH (ref <1.0)

## 2022-07-14 LAB — CULTURE, BLOOD (ROUTINE X 2)

## 2022-07-14 LAB — FERRITIN: Ferritin: 32 ng/mL (ref 11–307)

## 2022-07-14 MED ORDER — OLANZAPINE 20 MG PO TABS: 20.0000 mg | ORAL_TABLET | Freq: Every day | ORAL | 0 refills | Status: DC

## 2022-07-14 MED ORDER — AMOXICILLIN-POT CLAVULANATE 875-125 MG PO TABS: 1.0000 | ORAL_TABLET | Freq: Two times a day (BID) | ORAL | 0 refills | Status: AC

## 2022-07-14 MED ORDER — PREDNISONE 50 MG PO TABS: 50.0000 mg | ORAL_TABLET | Freq: Every day | ORAL | 0 refills | Status: AC

## 2022-07-14 MED ORDER — OLANZAPINE 5 MG PO TABS: 5.0000 mg | ORAL_TABLET | Freq: Every day | ORAL | 0 refills | Status: DC

## 2022-07-14 MED ORDER — ZINC SULFATE 220 (50 ZN) MG PO CAPS: 220.0000 mg | ORAL_CAPSULE | Freq: Every day | ORAL | 0 refills | Status: AC

## 2022-07-14 MED ORDER — ASCORBIC ACID 500 MG PO TABS: 500.0000 mg | ORAL_TABLET | Freq: Every day | ORAL | 0 refills | Status: AC

## 2022-07-14 MED ORDER — ZOLPIDEM TARTRATE 5 MG PO TABS: 5.0000 mg | ORAL_TABLET | Freq: Every evening | ORAL | 0 refills | Status: DC | PRN

## 2022-07-14 NOTE — Discharge Summary (Signed)
Physician Discharge Summary  Maria Pace ZOX:[id number] DOB: Nov 01, 1940 DOA: 07/12/2022  PCP: Maria Pace  Admit date: 07/12/2022  Discharge date: 07/14/2022  Admitted From:ALF  Disposition:  ALF  Recommendations for Outpatient Follow-up:  Follow up with PCP in 1-2 weeks Follow-up with cardiology outpatient regarding reduced LV function as well as persistent SVT with referral sent Continue on prednisone for 5 more days as well as Augmentin for 5 more days as prescribed Continue other home medications as prior  Home Health: None  Equipment/Devices: None  Discharge Condition:Stable  CODE STATUS: Full  Diet recommendation: Heart Healthy/carb modified  Brief/Interim Summary:  Maria Pace is a 82 y.o. female with medical history significant of bipolar disorder/schizophrenia, irregular heartbeat/SVT on chronic Eliquis, chronic systolic/diastolic heart failure, type 2 diabetes, hypertension, hyperlipidemia and GERD; who presented to the hospital secondary to general malaise, shortness of breath, coughing spells, fever and per patient reports oxygen desaturation with activity.  Patient was admitted for sepsis, present on admission due to COVID-19 infection with superimposed bacterial pneumonia.  She was started on treatment with IV antibiotics as well as steroids and did not meet criteria for remdesivir or Paxlovid.  She was noted to have some episodes of SVT during the course of her stay and this was asymptomatic.  She was increased back to her baseline level of home metoprolol and these episodes have now resolved and she is in stable condition for discharge.  Discharge Diagnoses:  Principal Problem:   Sepsis due to COVID-19 Active Problems:   Hypertension   Type 2 diabetes mellitus   Hyperlipidemia   GERD (gastroesophageal reflux disease)   SVT (supraventricular tachycardia)   Chronic combined systolic and diastolic congestive heart failure  Principal discharge  diagnosis: Sepsis, present on admission due to superimposed bacterial pneumonia with COVID-19 infection.  Recurrent SVT.  Discharge Instructions  Discharge Instructions     Ambulatory referral to Cardiology   Complete by: As directed    Diet - low sodium heart healthy   Complete by: As directed    Increase activity slowly   Complete by: As directed       Allergies as of 07/14/2022       Reactions   Haloperidol Lactate Other (See Comments)   Couldn't urinate anymore        Medication List     TAKE these medications    albuterol 108 (90 Base) MCG/ACT inhaler Commonly known as: VENTOLIN HFA Inhale into the lungs.   amoxicillin-clavulanate 875-125 MG tablet Commonly known as: AUGMENTIN Take 1 tablet by mouth 2 (two) times daily for 5 days.   apixaban 5 MG Tabs tablet Commonly known as: ELIQUIS Take 1 tablet (5 mg total) by mouth 2 (two) times daily.   ascorbic acid 500 MG tablet Commonly known as: VITAMIN C Take 1 tablet (500 mg total) by mouth daily. Start taking on: July 15, 2022   atorvastatin 20 MG tablet Commonly known as: LIPITOR Take 20 mg by mouth daily.   cholecalciferol 10 MCG (400 UNIT) Tabs tablet Commonly known as: VITAMIN D3 Take 400 Units by mouth.   cyanocobalamin 500 MCG tablet Commonly known as: VITAMIN B12 Take 1 tablet (500 mcg total) by mouth daily.   docusate sodium 100 MG capsule Commonly known as: COLACE Take 200 mg by mouth daily.   FeroSul 325 (65 FE) MG tablet Generic drug: ferrous sulfate Take 325 mg by mouth daily with breakfast.   gabapentin 400 MG capsule Commonly known as: NEURONTIN Take  400 mg by mouth at bedtime.   latanoprost 0.005 % ophthalmic solution Commonly known as: XALATAN Place 1 drop into both eyes at bedtime.   lisinopril 2.5 MG tablet Commonly known as: ZESTRIL Take 1 tablet (2.5 mg total) by mouth daily.   loratadine 10 MG tablet Commonly known as: CLARITIN Take 10 mg by mouth daily.    metFORMIN 500 MG tablet Commonly known as: GLUCOPHAGE Take 500 mg by mouth 2 (two) times daily with a meal.   metoprolol tartrate 25 MG tablet Commonly known as: LOPRESSOR Take 1 tablet (25 mg total) by mouth 2 (two) times daily.   multivitamin tablet Take 1 tablet by mouth daily.   Nyamyc powder Generic drug: nystatin Apply 1 Application topically 3 (three) times daily.   OLANZapine 20 MG tablet Commonly known as: ZYPREXA Take 1 tablet (20 mg total) by mouth at bedtime. Total of 25 mg at night. Take along with 20 mg tab   OLANZapine 5 MG tablet Commonly known as: ZYPREXA Take 1 tablet (5 mg total) by mouth at bedtime. Total of 25 mg at night. Take along with 20 mg tab   pantoprazole 40 MG tablet Commonly known as: PROTONIX Take 40 mg by mouth daily.   polyethylene glycol powder 17 GM/SCOOP powder Commonly known as: GLYCOLAX/MIRALAX Take 17 g by mouth daily as needed for mild constipation or moderate constipation.   predniSONE 50 MG tablet Commonly known as: DELTASONE Take 1 tablet (50 mg total) by mouth daily for 5 days. Start taking on: July 15, 2022   senna-docusate 8.6-50 MG tablet Commonly known as: Senokot-S Take 2 tablets by mouth at bedtime.   Vicks VapoRub 4.7-1.2-2.6 % Oint Apply 1 application topically daily. Apply once to toenails daily   zinc sulfate 220 (50 Zn) MG capsule Take 1 capsule (220 mg total) by mouth daily. Start taking on: July 15, 2022   zolpidem 5 MG tablet Commonly known as: AMBIEN Take 1 tablet (5 mg total) by mouth at bedtime as needed for sleep.        Follow-up Information     Maria Pace. Schedule an appointment as soon as possible for a visit in 1 week(s).   Specialty: Internal Medicine Contact information: [address] [place] [address] [phone]                Allergies  Allergen Reactions   Haloperidol Lactate Other (See Comments)    Couldn't urinate anymore     Consultations: None   Procedures/Studies: ECHOCARDIOGRAM COMPLETE  Result Date: 07/13/2022    ECHOCARDIOGRAM REPORT   Patient Name:   Maria Pace Date of Exam: 07/13/2022 Medical Rec #:  [id number]      Height:       60.0 in Accession #:    [id number]     Weight:       200.0 lb Date of Birth:  11-20-1940       BSA:          1.867 m Patient Age:    81 years       BP:           116/64 mmHg Patient Gender: F              HR:           78 bpm. [facility] Procedure: 2D Echo, Color Doppler and Cardiac Doppler Indications:    Congestive Heart Failure I50.9  History:  Patient has prior history of Echocardiogram examinations, most                 recent 10/06/2019. CHF, Sepsis due to covid,                 Arrythmias:Tachycardia, Signs/Symptoms:Altered Mental Status;                 Risk Factors:Diabetes, Hypertension and Dyslipidemia.  Sonographer:    Aron Baba Referring Phys: [id number] CARLOS MADERA  Sonographer Comments: Image acquisition challenging due to patient body habitus and Image acquisition challenging due to respiratory motion. IMPRESSIONS  1. Left ventricular ejection fraction, by estimation, is 30 to 35%. The left ventricle has moderately decreased function. The left ventricle demonstrates regional wall motion abnormalities (see scoring diagram/findings for description). The left ventricular internal cavity size was mildly to moderately dilated. Left ventricular diastolic parameters are indeterminate.  2. Right ventricular systolic function is normal. The right ventricular size is normal. There is severely elevated pulmonary artery systolic pressure. The estimated right ventricular systolic pressure is 67.6 mmHg.  3. Left atrial size was mildly dilated.  4. The mitral valve is degenerative. Mild to moderate mitral valve regurgitation.  5. The aortic valve is tricuspid. Aortic valve regurgitation is trivial.  6. The inferior vena cava is dilated in size with >50% respiratory  variability, suggesting right atrial pressure of 8 mmHg. Comparison(s): Prior images reviewed side by side. LVEF similar range to prior study, 30-35%. Wall motion abnormalities more prominent, although in association with LBBB as well. Estimated RVSP has increased. FINDINGS  Left Ventricle: Left ventricular ejection fraction, by estimation, is 30 to 35%. The left ventricle has moderately decreased function. The left ventricle demonstrates regional wall motion abnormalities. The left ventricular internal cavity size was mildly to moderately dilated. There is no left ventricular hypertrophy. Abnormal (paradoxical) septal motion, consistent with left bundle branch block. Left ventricular diastolic parameters are indeterminate.  LV Wall Scoring: The entire septum, apical inferior segment, and apex are akinetic. The entire anterior wall, antero-lateral wall, mid and distal lateral wall, and inferior wall are hypokinetic. The basal inferolateral segment is normal. Right Ventricle: The right ventricular size is normal. No increase in right ventricular wall thickness. Right ventricular systolic function is normal. There is severely elevated pulmonary artery systolic pressure. The tricuspid regurgitant velocity is 3.86 m/s, and with an assumed right atrial pressure of 8 mmHg, the estimated right ventricular systolic pressure is 67.6 mmHg. Left Atrium: Left atrial size was mildly dilated. Right Atrium: Right atrial size was normal in size. Pericardium: There is no evidence of pericardial effusion. Mitral Valve: The mitral valve is degenerative in appearance. There is mild calcification of the mitral valve leaflet(s). Mild mitral annular calcification. Mild to moderate mitral valve regurgitation, with posteriorly-directed jet. Tricuspid Valve: The tricuspid valve is grossly normal. Tricuspid valve regurgitation is mild. Aortic Valve: The aortic valve is tricuspid. Aortic valve regurgitation is trivial. Pulmonic Valve: The  pulmonic valve was grossly normal. Pulmonic valve regurgitation is trivial. Aorta: The aortic root is normal in size and structure. Venous: The inferior vena cava is dilated in size with greater than 50% respiratory variability, suggesting right atrial pressure of 8 mmHg. IAS/Shunts: No atrial level shunt detected by color flow Doppler.  LEFT VENTRICLE PLAX 2D LVIDd:         6.00 cm   Diastology LVIDs:         5.10 cm   LV e' medial:    5.55 cm/s LV  PW:         1.40 cm   LV E/e' medial:  18.4 LV IVS:        0.80 cm   LV e' lateral:   9.83 cm/s LVOT diam:     1.70 cm   LV E/e' lateral: 10.4 LV SV:         30 LV SV Index:   16 LVOT Area:     2.27 cm  RIGHT VENTRICLE RV S prime:     8.15 cm/s TAPSE (M-mode): 1.6 cm LEFT ATRIUM             Index        RIGHT ATRIUM           Index LA diam:        5.60 cm 3.00 cm/m   RA Area:     10.50 cm LA Vol (A2C):   82.9 ml 44.41 ml/m  RA Volume:   19.50 ml  10.45 ml/m LA Vol (A4C):   56.4 ml 30.22 ml/m LA Biplane Vol: 70.5 ml 37.77 ml/m  AORTIC VALVE             PULMONIC VALVE LVOT Vmax:   82.70 cm/s  PR End Diast Vel: 7.51 msec LVOT Vmean:  52.700 cm/s LVOT VTI:    0.133 m  AORTA Ao Root diam: 3.00 cm Ao Asc diam:  3.20 cm MITRAL VALVE                 TRICUSPID VALVE MV Area (PHT): 4.68 cm      TR Peak grad:   59.6 mmHg MV Decel Time: 162 msec      TR Vmax:        386.00 cm/s MR Peak grad:   49.3 mmHg MR Vmax:        351.00 cm/s  SHUNTS MR PISA:        1.01 cm     Systemic VTI:  0.13 m MR PISA Radius: 0.40 cm      Systemic Diam: 1.70 cm MV E velocity: 102.00 cm/s Nona Dell Pace Electronically signed by Nona Dell Pace Signature Date/Time: 07/13/2022/11:44:14 AM    Final    DG Chest Port 1 View  Result Date: 07/12/2022 CLINICAL DATA:  Possible sepsis EXAM: PORTABLE CHEST 1 VIEW COMPARISON:  Chest radiograph 07/01/2020 FINDINGS: No pleural effusion. No pneumothorax. Cardiomegaly. No focal airspace opacity. No radiographically apparent displaced rib fractures.  Visualized upper abdomen is unremarkable. Degenerative changes of the bilateral AC and glenohumeral joints. IMPRESSION: Cardiomegaly.  No focal airspace opacity. Electronically Signed   By: Lorenza Cambridge M.D.   On: 07/12/2022 08:16     Discharge Exam: Vitals:   07/14/22 0225 07/14/22 0804  BP: 137/81   Pulse: 79 73  Resp: 18 17  Temp: 97.9 F (36.6 C)   SpO2: 98% 98%   Vitals:   07/13/22 2206 07/13/22 2325 07/14/22 0225 07/14/22 0804  BP: 133/72  137/81   Pulse: 96 87 79 73  Resp: 20  18 17   Temp: 98.2 F (36.8 C)  97.9 F (36.6 C)   TempSrc: Oral  Oral   SpO2: 98%  98% 98%  Weight:      Height:        General: Pt is alert, awake, not in acute distress Cardiovascular: RRR, S1/S2 +, no rubs, no gallops Respiratory: CTA bilaterally, no wheezing, no rhonchi Abdominal: Soft, NT, ND, bowel sounds + Extremities: no edema, no cyanosis  The results of significant diagnostics from this hospitalization (including imaging, microbiology, ancillary and laboratory) are listed below for reference.     Microbiology: Recent Results (from the past 240 hour(s))  Resp panel by RT-PCR (RSV, Flu A&B, Covid) Anterior Nasal Swab     Status: Abnormal   Collection Time: 07/12/22  8:10 AM   Specimen: Anterior Nasal Swab  Result Value Ref Range Status   SARS Coronavirus 2 by RT PCR POSITIVE (A) NEGATIVE Final    Comment: (NOTE) SARS-CoV-2 target nucleic acids are DETECTED.  The SARS-CoV-2 RNA is generally detectable in upper respiratory specimens during the acute phase of infection. Positive results are indicative of the presence of the identified virus, but do not rule out bacterial infection or co-infection with other pathogens not detected by the test. Clinical correlation with patient history and other diagnostic information is necessary to determine patient infection status. The expected result is Negative.  Fact Sheet for  Patients: BloggerCourse.com  Fact Sheet for Healthcare Providers: SeriousBroker.it  This test is not yet approved or cleared by the [place] FDA and  has been authorized for detection and/or diagnosis of SARS-CoV-2 by FDA under an Emergency Use Authorization (EUA).  This EUA will remain in effect (meaning this test can be used) for the duration of  the COVID-19 declaration under Section 564(b)(1) of the A ct, 21 U.S.C. section 360bbb-3(b)(1), unless the authorization is terminated or revoked sooner.     Influenza A by PCR NEGATIVE NEGATIVE Final   Influenza B by PCR NEGATIVE NEGATIVE Final    Comment: (NOTE) The Xpert Xpress SARS-CoV-2/FLU/RSV plus assay is intended as an aid in the diagnosis of influenza from Nasopharyngeal swab specimens and should not be used as a sole basis for treatment. Nasal washings and aspirates are unacceptable for Xpert Xpress SARS-CoV-2/FLU/RSV testing.  Fact Sheet for Patients: BloggerCourse.com  Fact Sheet for Healthcare Providers: SeriousBroker.it  This test is not yet approved or cleared by the [place] FDA and has been authorized for detection and/or diagnosis of SARS-CoV-2 by FDA under an Emergency Use Authorization (EUA). This EUA will remain in effect (meaning this test can be used) for the duration of the COVID-19 declaration under Section 564(b)(1) of the Act, 21 U.S.C. section 360bbb-3(b)(1), unless the authorization is terminated or revoked.     Resp Syncytial Virus by PCR NEGATIVE NEGATIVE Final    Comment: (NOTE) Fact Sheet for Patients: BloggerCourse.com  Fact Sheet for Healthcare Providers: SeriousBroker.it  This test is not yet approved or cleared by the [place] FDA and has been authorized for detection and/or diagnosis of SARS-CoV-2 by FDA under an Emergency Use  Authorization (EUA). This EUA will remain in effect (meaning this test can be used) for the duration of the COVID-19 declaration under Section 564(b)(1) of the Act, 21 U.S.C. section 360bbb-3(b)(1), unless the authorization is terminated or revoked.  Performed at [facility], [address], [place], [place] [address]   Blood Culture (routine x 2)     Status: None (Preliminary result)   Collection Time: 07/12/22  8:23 AM   Specimen: BLOOD LEFT HAND  Result Value Ref Range Status   Specimen Description BLOOD LEFT HAND  Final   Special Requests   Final    BOTTLES DRAWN AEROBIC AND ANAEROBIC Blood Culture results may not be optimal due to an excessive volume of blood received in culture bottles   Culture   Final    NO GROWTH 2 DAYS Performed at [facility], [address] [place], [place] [address]    Report Status PENDING  Incomplete  Blood Culture (routine x 2)     Status: None (Preliminary result)   Collection Time: 07/12/22  8:23 AM   Specimen: BLOOD RIGHT ARM  Result Value Ref Range Status   Specimen Description BLOOD RIGHT ARM  Final   Special Requests   Final    BOTTLES DRAWN AEROBIC AND ANAEROBIC Blood Culture adequate volume   Culture   Final    NO GROWTH 2 DAYS Performed at [facility], [address], [place], [place] [address]    Report Status PENDING  Incomplete  MRSA Next Gen by PCR, Nasal     Status: None   Collection Time: 07/12/22  6:55 PM   Specimen: Nasal Mucosa; Nasal Swab  Result Value Ref Range Status   MRSA by PCR Next Gen NOT DETECTED NOT DETECTED Final    Comment: (NOTE) The GeneXpert MRSA Assay (FDA approved for NASAL specimens only), is one component of a comprehensive MRSA colonization surveillance program. It is not intended to diagnose MRSA infection nor to guide or monitor treatment for MRSA infections. Test performance is not FDA approved in patients less than 60 years old. Performed at [facility], [address] [place], [place] [address]       Labs: BNP (last 3 results) Recent Labs    07/12/22 0825  BNP 897.0*   Basic Metabolic Panel: Recent Labs  Lab 07/12/22 0828 07/13/22 0500 07/14/22 0735  NA 131* 134* 135  K 3.2* 4.5 4.0  CL 104 105 105  CO2 21* 20* 20*  GLUCOSE 135* 146* 155*  BUN CREATININE 1.16* 0.96 0.94  CALCIUM 7.9* 8.1* 8.3*  MG  --  1.6* 2.0   Liver Function Tests: Recent Labs  Lab 07/12/22 0828 07/13/22 0500 07/14/22 0735  AST 21 33 26  ALT ALKPHOS 62 56 61  BILITOT 0.2* 1.0 <0.1*  PROT 6.6 6.4* 6.8  ALBUMIN 3.2* 3.0* 3.1*   No results for input(s): "LIPASE", "AMYLASE" in the last 168 hours. No results for input(s): "AMMONIA" in the last 168 hours. CBC: Recent Labs  Lab 07/12/22 0828 07/13/22 0500 07/14/22 0615  WBC 9.8 8.8 12.7*  NEUTROABS 7.7  --   --   HGB 10.0* 10.0* 10.5*  HCT 31.0* 32.4* 33.0*  MCV 88.3 91.8 89.9  PLT 196 138* 211   Cardiac Enzymes: No results for input(s): "CKTOTAL", "CKMB", "CKMBINDEX", "TROPONINI" in the last 168 hours. BNP: Invalid input(s): "POCBNP" CBG: Recent Labs  Lab 07/13/22 0801 07/13/22 1225 07/13/22 1628 07/13/22 2210 07/14/22 0839  GLUCAP 134* 222* 156* 282* 157*   D-Dimer No results for input(s): "DDIMER" in the last 72 hours. Hgb A1c Recent Labs    07/12/22 1424  HGBA1C 6.2*   Lipid Profile No results for input(s): "CHOL", "HDL", "LDLCALC", "TRIG", "CHOLHDL", "LDLDIRECT" in the last 72 hours. Thyroid function studies Recent Labs    07/13/22 0500  TSH 0.447   Anemia work up Recent Labs    07/13/22 0500 07/14/22 0514  FERRITIN 28 32   Urinalysis    Component Value Date/Time   COLORURINE YELLOW 07/12/2022 0924   APPEARANCEUR CLEAR 07/12/2022 0924   APPEARANCEUR Clear 01/06/2022 1100   LABSPEC 1.011 07/12/2022 0924   LABSPEC 1.005 10/28/2013 0457   PHURINE 7.0 07/12/2022 0924   GLUCOSEU NEGATIVE 07/12/2022 0924   GLUCOSEU 50 mg/dL 65/78/4696 2952   HGBUR NEGATIVE 07/12/2022 0924    BILIRUBINUR  NEGATIVE 07/12/2022 0924   BILIRUBINUR Negative 01/06/2022 1100   BILIRUBINUR Negative 10/28/2013 0457   KETONESUR NEGATIVE 07/12/2022 0924   PROTEINUR NEGATIVE 07/12/2022 0924   NITRITE NEGATIVE 07/12/2022 0924   LEUKOCYTESUR SMALL (A) 07/12/2022 0924   LEUKOCYTESUR 3+ 10/28/2013 0457   Sepsis Labs Recent Labs  Lab 07/12/22 0828 07/13/22 0500 07/14/22 0615  WBC 9.8 8.8 12.7*   Microbiology Recent Results (from the past 240 hour(s))  Resp panel by RT-PCR (RSV, Flu A&B, Covid) Anterior Nasal Swab     Status: Abnormal   Collection Time: 07/12/22  8:10 AM   Specimen: Anterior Nasal Swab  Result Value Ref Range Status   SARS Coronavirus 2 by RT PCR POSITIVE (A) NEGATIVE Final    Comment: (NOTE) SARS-CoV-2 target nucleic acids are DETECTED.  The SARS-CoV-2 RNA is generally detectable in upper respiratory specimens during the acute phase of infection. Positive results are indicative of the presence of the identified virus, but do not rule out bacterial infection or co-infection with other pathogens not detected by the test. Clinical correlation with patient history and other diagnostic information is necessary to determine patient infection status. The expected result is Negative.  Fact Sheet for Patients: BloggerCourse.com  Fact Sheet for Healthcare Providers: SeriousBroker.it  This test is not yet approved or cleared by the [place] FDA and  has been authorized for detection and/or diagnosis of SARS-CoV-2 by FDA under an Emergency Use Authorization (EUA).  This EUA will remain in effect (meaning this test can be used) for the duration of  the COVID-19 declaration under Section 564(b)(1) of the A ct, 21 U.S.C. section 360bbb-3(b)(1), unless the authorization is terminated or revoked sooner.     Influenza A by PCR NEGATIVE NEGATIVE Final   Influenza B by PCR NEGATIVE NEGATIVE Final    Comment: (NOTE) The  Xpert Xpress SARS-CoV-2/FLU/RSV plus assay is intended as an aid in the diagnosis of influenza from Nasopharyngeal swab specimens and should not be used as a sole basis for treatment. Nasal washings and aspirates are unacceptable for Xpert Xpress SARS-CoV-2/FLU/RSV testing.  Fact Sheet for Patients: BloggerCourse.com  Fact Sheet for Healthcare Providers: SeriousBroker.it  This test is not yet approved or cleared by the [place] FDA and has been authorized for detection and/or diagnosis of SARS-CoV-2 by FDA under an Emergency Use Authorization (EUA). This EUA will remain in effect (meaning this test can be used) for the duration of the COVID-19 declaration under Section 564(b)(1) of the Act, 21 U.S.C. section 360bbb-3(b)(1), unless the authorization is terminated or revoked.     Resp Syncytial Virus by PCR NEGATIVE NEGATIVE Final    Comment: (NOTE) Fact Sheet for Patients: BloggerCourse.com  Fact Sheet for Healthcare Providers: SeriousBroker.it  This test is not yet approved or cleared by the [place] FDA and has been authorized for detection and/or diagnosis of SARS-CoV-2 by FDA under an Emergency Use Authorization (EUA). This EUA will remain in effect (meaning this test can be used) for the duration of the COVID-19 declaration under Section 564(b)(1) of the Act, 21 U.S.C. section 360bbb-3(b)(1), unless the authorization is terminated or revoked.  Performed at [facility], [address], [place], [place] [address]   Blood Culture (routine x 2)     Status: None (Preliminary result)   Collection Time: 07/12/22  8:23 AM   Specimen: BLOOD LEFT HAND  Result Value Ref Range Status   Specimen Description BLOOD LEFT HAND  Final   Special Requests   Final  BOTTLES DRAWN AEROBIC AND ANAEROBIC Blood Culture results may not be optimal due to an excessive volume of blood  received in culture bottles   Culture   Final    NO GROWTH 2 DAYS Performed at [facility], [address] [place], [place] [address]    Report Status PENDING  Incomplete  Blood Culture (routine x 2)     Status: None (Preliminary result)   Collection Time: 07/12/22  8:23 AM   Specimen: BLOOD RIGHT ARM  Result Value Ref Range Status   Specimen Description BLOOD RIGHT ARM  Final   Special Requests   Final    BOTTLES DRAWN AEROBIC AND ANAEROBIC Blood Culture adequate volume   Culture   Final    NO GROWTH 2 DAYS Performed at [facility], [address] [place], [place] [address]    Report Status PENDING  Incomplete  MRSA Next Gen by PCR, Nasal     Status: None   Collection Time: 07/12/22  6:55 PM   Specimen: Nasal Mucosa; Nasal Swab  Result Value Ref Range Status   MRSA by PCR Next Gen NOT DETECTED NOT DETECTED Final    Comment: (NOTE) The GeneXpert MRSA Assay (FDA approved for NASAL specimens only), is one component of a comprehensive MRSA colonization surveillance program. It is not intended to diagnose MRSA infection nor to guide or monitor treatment for MRSA infections. Test performance is not FDA approved in patients less than 15 years old. Performed at [facility], [address] [place], [place] [address]      Time coordinating discharge: 35 minutes  SIGNED:   Erick Blinks, DO [facility] 07/14/2022, 10:43 AM  If 7PM-7AM, please contact night-coverage [email or web address]

## 2022-07-14 NOTE — [place] FL2 (Signed)
[employer] MEDICAID FL2 LEVEL OF CARE FORM     IDENTIFICATION  Patient Name: Maria Pace Birthdate: 09/10/1940 Sex: female Admission Date (Current Location): 07/12/2022  [place] and [place] Number:  Aaron Edelman [id number] Q Facility and Address:  [facility],  [address] [place] [address]      Provider Number: [id number]  Attending Physician Name and Address:  Erick Blinks, DO  Relative Name and Phone Number:       Current Level of Care: Hospital Recommended Level of Care: [facility] Prior Approval Number:    Date Approved/Denied:   PASRR Number:    Discharge Plan: Other (Comment) [facility])    Current Diagnoses: Patient Active Problem List   Diagnosis Date Noted   Sepsis due to COVID-19 07/12/2022   Chronic combined systolic and diastolic congestive heart failure    ARF (acute renal failure) 10/04/2019   Elevated CK 10/03/2019   SVT (supraventricular tachycardia) 10/03/2019   GERD (gastroesophageal reflux disease)    Schizophrenia 11/15/2017   Hypotension 02/20/2017   Severe dehydration 02/20/2017   AKI (acute kidney injury) 02/20/2017   Constipation 02/20/2017   Lactic acidosis 02/20/2017   Urinary retention 11/10/2016   Hypertension 11/10/2016   Type 2 diabetes mellitus 11/10/2016   Hyperlipidemia 11/10/2016   Hyponatremia 11/10/2016   SBO (small bowel obstruction) 11/03/2015    Orientation RESPIRATION BLADDER Height & Weight     Self, Time, Situation, Place  Normal Incontinent Weight: 90.7 kg Height:  5' (152.4 cm)  BEHAVIORAL SYMPTOMS/MOOD NEUROLOGICAL BOWEL NUTRITION STATUS      Incontinent Diet (Heart healthy/carb modified)  AMBULATORY STATUS COMMUNICATION OF NEEDS Skin   Limited Assist Verbally Normal                       Personal Care Assistance Level of Assistance  Bathing, Feeding, Dressing Bathing Assistance: Limited assistance Feeding assistance: Limited assistance Dressing Assistance: Limited assistance      Functional Limitations Info  Sight, Hearing, Speech Sight Info: Adequate Hearing Info: Adequate Speech Info: Adequate    SPECIAL CARE FACTORS FREQUENCY                       Contractures Contractures Info: Not present    Additional Factors Info  Code Status, Allergies, Psychotropic, Isolation Precautions Code Status Info: Full code Allergies Info: Haloperidol Lactate Psychotropic Info: Zyprexa, Ambien   Isolation Precautions Info: Airborne/contact precautions    COVID+ 07/12/22     Current Medications (07/14/2022):  This is the current hospital active medication list Current Facility-Administered Medications  Medication Dose Route Frequency Provider Last Rate Last Admin   apixaban (ELIQUIS) tablet 5 mg  5 mg Oral BID Vassie Loll, MD   5 mg at 07/14/22 9562   ascorbic acid (VITAMIN C) tablet 500 mg  500 mg Oral Daily Vassie Loll, MD   500 mg at 07/14/22 1308   atorvastatin (LIPITOR) tablet 20 mg  20 mg Oral Daily Vassie Loll, MD   20 mg at 07/14/22 0812   budesonide (PULMICORT) nebulizer solution 0.5 mg  0.5 mg Nebulization BID Vassie Loll, MD   0.5 mg at 07/14/22 0804   ceFEPIme (MAXIPIME) 2 g in sodium chloride 0.9 % 100 mL IVPB  2 g Intravenous Q12H Earnie Larsson, RPH 200 mL/hr at 07/14/22 0817 2 g at 07/14/22 0817   docusate sodium (COLACE) capsule 200 mg  200 mg Oral Daily Vassie Loll, MD   200 mg at 07/14/22  1478   gabapentin (NEURONTIN) capsule 400 mg  400 mg Oral QHS Vassie Loll, MD   400 mg at 07/13/22 2221   insulin aspart (novoLOG) injection 0-5 Units  0-5 Units Subcutaneous QHS Vassie Loll, MD   3 Units at 07/13/22 2221   insulin aspart (novoLOG) injection 0-9 Units  0-9 Units Subcutaneous TID WC Vassie Loll, MD   2 Units at 07/14/22 0843   ipratropium-albuterol (DUONEB) 0.5-2.5 (3) MG/3ML nebulizer solution 3 mL  3 mL Nebulization Q6H PRN Vassie Loll, MD       latanoprost (XALATAN) 0.005 % ophthalmic solution 1 drop  1 drop Both Eyes  QHS Vassie Loll, MD   1 drop at 07/13/22 2222   methylPREDNISolone sodium succinate (SOLU-MEDROL) 125 mg/2 mL injection 45.625 mg  0.5 mg/kg Intravenous Lawerance Cruel, MD   45.625 mg at 07/14/22 2956   Followed by   Melene Muller ON 07/15/2022] predniSONE (DELTASONE) tablet 50 mg  50 mg Oral Daily Vassie Loll, MD       metoprolol tartrate (LOPRESSOR) injection 5 mg  5 mg Intravenous Q5 min PRN Sherryll Burger, Pratik D, DO   5 mg at 07/13/22 2009   metoprolol tartrate (LOPRESSOR) tablet 25 mg  25 mg Oral BID Sherryll Burger, Pratik D, DO   25 mg at 07/14/22 2130   multivitamin with minerals tablet 1 tablet  1 tablet Oral Daily Vassie Loll, MD   1 tablet at 07/14/22 0812   OLANZapine (ZYPREXA) tablet 25 mg  25 mg Oral QHS Vassie Loll, MD   25 mg at 07/13/22 2221   pantoprazole (PROTONIX) EC tablet 40 mg  40 mg Oral Daily Vassie Loll, MD   40 mg at 07/14/22 8657   polyethylene glycol (MIRALAX / GLYCOLAX) packet 17 g  17 g Oral Daily PRN Vassie Loll, MD       vancomycin (VANCOREADY) IVPB 1250 mg/250 mL  1,250 mg Intravenous Q48H Gerhard Munch, MD 166.7 mL/hr at 07/14/22 0936 1,250 mg at 07/14/22 0936   zinc sulfate capsule 220 mg  220 mg Oral Daily Vassie Loll, MD   220 mg at 07/14/22 8469     Discharge Medications:  Medication List       TAKE these medications     albuterol 108 (90 Base) MCG/ACT inhaler Commonly known as: VENTOLIN HFA Inhale into the lungs.    amoxicillin-clavulanate 875-125 MG tablet Commonly known as: AUGMENTIN Take 1 tablet by mouth 2 (two) times daily for 5 days.    apixaban 5 MG Tabs tablet Commonly known as: ELIQUIS Take 1 tablet (5 mg total) by mouth 2 (two) times daily.    ascorbic acid 500 MG tablet Commonly known as: VITAMIN C Take 1 tablet (500 mg total) by mouth daily. Start taking on: July 15, 2022    atorvastatin 20 MG tablet Commonly known as: LIPITOR Take 20 mg by mouth daily.    cholecalciferol 10 MCG (400 UNIT) Tabs tablet Commonly known  as: VITAMIN D3 Take 400 Units by mouth.    cyanocobalamin 500 MCG tablet Commonly known as: VITAMIN B12 Take 1 tablet (500 mcg total) by mouth daily.    docusate sodium 100 MG capsule Commonly known as: COLACE Take 200 mg by mouth daily.    FeroSul 325 (65 FE) MG tablet Generic drug: ferrous sulfate Take 325 mg by mouth daily with breakfast.    gabapentin 400 MG capsule Commonly known as: NEURONTIN Take 400 mg by mouth at bedtime.    latanoprost 0.005 % ophthalmic solution Commonly  known as: XALATAN Place 1 drop into both eyes at bedtime.    lisinopril 2.5 MG tablet Commonly known as: ZESTRIL Take 1 tablet (2.5 mg total) by mouth daily.    loratadine 10 MG tablet Commonly known as: CLARITIN Take 10 mg by mouth daily.    metFORMIN 500 MG tablet Commonly known as: GLUCOPHAGE Take 500 mg by mouth 2 (two) times daily with a meal.    metoprolol tartrate 25 MG tablet Commonly known as: LOPRESSOR Take 1 tablet (25 mg total) by mouth 2 (two) times daily.    multivitamin tablet Take 1 tablet by mouth daily.    Nyamyc powder Generic drug: nystatin Apply 1 Application topically 3 (three) times daily.    OLANZapine 20 MG tablet Commonly known as: ZYPREXA Take 1 tablet (20 mg total) by mouth at bedtime. Total of 25 mg at night. Take along with 20 mg tab    OLANZapine 5 MG tablet Commonly known as: ZYPREXA Take 1 tablet (5 mg total) by mouth at bedtime. Total of 25 mg at night. Take along with 20 mg tab    pantoprazole 40 MG tablet Commonly known as: PROTONIX Take 40 mg by mouth daily.    polyethylene glycol powder 17 GM/SCOOP powder Commonly known as: GLYCOLAX/MIRALAX Take 17 g by mouth daily as needed for mild constipation or moderate constipation.    predniSONE 50 MG tablet Commonly known as: DELTASONE Take 1 tablet (50 mg total) by mouth daily for 5 days. Start taking on: July 15, 2022    senna-docusate 8.6-50 MG tablet Commonly known as: Senokot-S Take 2  tablets by mouth at bedtime.    Vicks VapoRub 4.7-1.2-2.6 % Oint Apply 1 application topically daily. Apply once to toenails daily    zinc sulfate 220 (50 Zn) MG capsule Take 1 capsule (220 mg total) by mouth daily. Start taking on: July 15, 2022    zolpidem 5 MG tablet Commonly known as: AMBIEN Take 1 tablet (5 mg total) by mouth at bedtime as needed for sleep.           Relevant Imaging Results:  Relevant Lab Results:   Additional Information    Leitha Bleak, RN

## 2022-07-14 NOTE — TOC Transition Note (Signed)
Transition of Care [facility]) - CM/SW Discharge Note   Patient Details  Name: [name] MRN: [id number] Date of Birth: [birth year]  Transition of Care [facility]) CM/SW Contact:  Leitha Bleak, RN Phone Number: 07/14/2022, 11:16 AM   Clinical Narrative:  Patient medically ready to discharge. CM call [facility] owner of [facility]. She will send someone to transport patient at 1PM. Team updated. FL2 completed and Discharge packet printed for RN to give to driver for Family care home staff.     Final next level of care: Other (comment) (Family Care Home) Barriers to Discharge: Barriers Resolved   Patient Goals and CMS Choice CMS Medicare.gov Compare Post Acute Care list provided to:: Patient Represenative (must comment)    Discharge Placement   Discharge Plan and Services Additional resources added to the After Visit Summary for         Social Determinants of Health (SDOH) Interventions SDOH Screenings   Food Insecurity: No Food Insecurity (07/12/2022)  Housing: Low Risk  (07/12/2022)  Transportation Needs: No Transportation Needs (07/12/2022)  Utilities: Not At Risk (07/12/2022)  Depression (PHQ2-9): High Risk (09/06/2021)  Tobacco Use: Low Risk  (07/12/2022)     Readmission Risk Interventions    07/14/2022   11:16 AM  Readmission Risk Prevention Plan  Transportation Screening Complete  PCP or Specialist Appt within 5-7 Days Not Complete  Home Care Screening Complete  Medication Review (RN CM) Complete

## 2022-07-14 NOTE — Progress Notes (Signed)
   07/13/22 1920  Assess: MEWS Score  Temp 98.4 F (36.9 C)  BP (!) 154/94  MAP (mmHg) 110  Pulse Rate (!) 155  Resp 18  Level of Consciousness Alert  SpO2 96 %  O2 Device Room Air  Assess: MEWS Score  MEWS Temp 0  MEWS Systolic 0  MEWS Pulse 3  MEWS RR 0  MEWS LOC 0  MEWS Score 3  MEWS Score Color Yellow  Assess: if the MEWS score is Yellow or Red  Were vital signs taken at a resting state? Yes  Focused Assessment No change from prior assessment  Does the patient meet 2 or more of the SIRS criteria? Yes  Does the patient have a confirmed or suspected source of infection? Yes  Provider and Rapid Response Notified? No  MEWS guidelines implemented  No, previously yellow, continue vital signs every 4 hours  Notify: Charge Nurse/RN  Name of Charge Nurse/RN Notified  Kennith Center, RN)  Provider Notification  Provider Name/Title  (Dr. Thomes Dinning)  Date Provider Notified 07/13/22  Time Provider Notified 1922  Method of Notification Page (Secure chat)  Notification Reason Other (Comment) (Yellow mews and HR 155)  Provider response Other (Comment) (Continue to monitor/get EKG)  Date of Provider Response 07/13/22  Time of Provider Response 2006  Assess: SIRS CRITERIA  SIRS Temperature  0  SIRS Pulse 1  SIRS Respirations  0  SIRS WBC 0  SIRS Score Sum  1

## 2022-07-16 LAB — CULTURE, BLOOD (ROUTINE X 2)
Culture: NO GROWTH
Culture: NO GROWTH

## 2022-07-17 LAB — CULTURE, BLOOD (ROUTINE X 2): Special Requests: ADEQUATE

## 2022-07-27 ENCOUNTER — Ambulatory Visit: Payer: Medicare (Managed Care) | Attending: Internal Medicine | Admitting: Internal Medicine

## 2022-07-27 ENCOUNTER — Encounter: Payer: Self-pay | Admitting: Internal Medicine

## 2022-07-27 ENCOUNTER — Ambulatory Visit (INDEPENDENT_AMBULATORY_CARE_PROVIDER_SITE_OTHER): Payer: Medicare (Managed Care)

## 2022-07-27 VITALS — BP 118/62 | HR 81 | Ht 60.0 in | Wt 181.2 lb

## 2022-07-27 DIAGNOSIS — I471 Supraventricular tachycardia, unspecified: Secondary | ICD-10-CM | POA: Diagnosis not present

## 2022-07-27 MED ORDER — SPIRONOLACTONE 25 MG PO TABS: 25.0000 mg | ORAL_TABLET | Freq: Every day | ORAL | 3 refills | Status: DC

## 2022-07-27 MED ORDER — SACUBITRIL-VALSARTAN 24-26 MG PO TABS: 1.0000 | ORAL_TABLET | Freq: Two times a day (BID) | ORAL | 11 refills | Status: DC

## 2022-07-27 NOTE — Progress Notes (Signed)
Cardiology Office Note  Date: 07/27/2022   ID: Maria Pace, DOB [birth year], MRN [id number]  PCP:  Maria Spar, MD  Cardiologist:  Marjo Bicker, MD Electrophysiologist:  None   Reason for Office Visit: Evaluation of systolic heart failure at the request of Dr. Sherryll Burger   History of Present Illness: Maria Pace is a 82 y.o. female known to have chronic systolic heart failure with LVEF 30 to 35% and RWMA, HTN, DM 2, HLD, history of SVT was referred to cardiology clinic for evaluation of systolic heart failure.  Lives in a facility, accompanied by caretaker.  Patient was initially referred to cardiology clinic in 2021 January for abnormal EKG. it was noted that she had atrial tachycardia and medical management was recommended.  She was lost to follow-up and presented again to cardiology clinic for management of chronic systolic heart failure with LVEF 30 to 35% with RWMA. Patient was admitted recently in 06/2022 with COVID-19 infection and had paroxysms of SVT.  I reviewed the cardiac monitoring strips from the hospitalization that showed SVT, atrial tachycardia and possibly atrial flutter (per the scanned report made this difficult to interpret).  She denies having any palpitations, angina, DOE (walks only from the bedroom to the living room and does not do much), syncope and leg swelling.  Past Medical History:  Diagnosis Date   Bipolar 1 disorder (HCC)    Essential hypertension    GERD (gastroesophageal reflux disease)    Hyperlipidemia    Irregular heart beat    Schizophrenia (HCC)    Type 2 diabetes mellitus (HCC)     Past Surgical History:  Procedure Laterality Date   YAG LASER APPLICATION Left 01/06/2015   Procedure: YAG LASER APPLICATION;  Surgeon: Jethro Bolus, MD;  Location: [facility];  Service: Ophthalmology;  Laterality: Left;    Current Outpatient Medications  Medication Sig Dispense Refill   albuterol (VENTOLIN HFA) 108 (90 Base) MCG/ACT inhaler Inhale  into the lungs.     apixaban (ELIQUIS) 5 MG TABS tablet Take 1 tablet (5 mg total) by mouth 2 (two) times daily. 60 tablet    atorvastatin (LIPITOR) 20 MG tablet Take 20 mg by mouth daily.     Camphor-Eucalyptus-Menthol (VICKS VAPORUB) 4.7-1.2-2.6 % OINT Apply 1 application topically daily. Apply once to toenails daily     cholecalciferol (VITAMIN D3) 10 MCG (400 UNIT) TABS tablet Take 400 Units by mouth.     docusate sodium (COLACE) 100 MG capsule Take 200 mg by mouth daily.     FEROSUL 325 (65 Fe) MG tablet Take 325 mg by mouth daily with breakfast.     gabapentin (NEURONTIN) 400 MG capsule Take 400 mg by mouth at bedtime.     latanoprost (XALATAN) 0.005 % ophthalmic solution Place 1 drop into both eyes at bedtime.     loratadine (CLARITIN) 10 MG tablet Take 10 mg by mouth daily.     metFORMIN (GLUCOPHAGE) 500 MG tablet Take 500 mg by mouth 2 (two) times daily with a meal.     metoprolol tartrate (LOPRESSOR) 25 MG tablet Take 1 tablet (25 mg total) by mouth 2 (two) times daily.     Multiple Vitamin (MULTIVITAMIN) tablet Take 1 tablet by mouth daily.     NYAMYC powder Apply 1 Application topically 3 (three) times daily.     OLANZapine (ZYPREXA) 20 MG tablet Take 1 tablet (20 mg total) by mouth at bedtime. Total of 25 mg at night. Take along with 20 mg tab  30 tablet 0   OLANZapine (ZYPREXA) 5 MG tablet Take 1 tablet (5 mg total) by mouth at bedtime. Total of 25 mg at night. Take along with 20 mg tab 30 tablet 0   pantoprazole (PROTONIX) 40 MG tablet Take 40 mg by mouth daily.     sacubitril-valsartan (ENTRESTO) 24-26 MG Take 1 tablet by mouth 2 (two) times daily. 60 tablet 11   senna-docusate (SENOKOT-S) 8.6-50 MG tablet Take 2 tablets by mouth at bedtime. 60 tablet 0   spironolactone (ALDACTONE) 25 MG tablet Take 1 tablet (25 mg total) by mouth daily. 90 tablet 3   vitamin B-12 (CYANOCOBALAMIN) 500 MCG tablet Take 1 tablet (500 mcg total) by mouth daily.     zolpidem (AMBIEN) 5 MG tablet Take 1  tablet (5 mg total) by mouth at bedtime as needed for sleep. 10 tablet 0   ascorbic acid (VITAMIN C) 500 MG tablet Take 1 tablet (500 mg total) by mouth daily. (Patient not taking: Reported on 07/27/2022) 30 tablet 0   polyethylene glycol powder (GLYCOLAX/MIRALAX) powder Take 17 g by mouth daily as needed for mild constipation or moderate constipation. (Patient not taking: Reported on 07/27/2022)     zinc sulfate 220 (50 Zn) MG capsule Take 1 capsule (220 mg total) by mouth daily. (Patient not taking: Reported on 07/27/2022) 30 capsule 0   No current facility-administered medications for this visit.   Allergies:  Haloperidol lactate   Social History: The patient  reports that she has never smoked. She has never used smokeless tobacco. She reports that she does not drink alcohol and does not use drugs.   Family History: The patient's family history includes Diabetes type II in her father; Hypertension in her father.   ROS:  Please see the history of present illness. Otherwise, complete review of systems is positive for none.  All other systems are reviewed and negative.   Physical Exam: VS:  BP 118/62   Pulse 81   Ht 5' (1.524 m)   Wt 181 lb 3.2 oz (82.2 kg)   SpO2 97%   BMI 35.39 kg/m , BMI Body mass index is 35.39 kg/m.  Wt Readings from Last 3 Encounters:  07/27/22 181 lb 3.2 oz (82.2 kg)  07/12/22 200 lb (90.7 kg)  10/07/19 194 lb 10.7 oz (88.3 kg)    General: Patient appears comfortable at rest. HEENT: Conjunctiva and lids normal, oropharynx clear with moist mucosa. Neck: Supple, no elevated JVP or carotid bruits, no thyromegaly. Lungs: Clear to auscultation, nonlabored breathing at rest. Cardiac: Regular rate and rhythm, no S3 or significant systolic murmur, no pericardial rub. Abdomen: Soft, nontender, no hepatomegaly, bowel sounds present, no guarding or rebound. Extremities: No pitting edema, distal pulses 2+. Skin: Warm and dry. Musculoskeletal: No  kyphosis. Neuropsychiatric: Alert and oriented x3, affect grossly appropriate.  Recent Labwork: 07/12/2022: B Natriuretic Peptide 897.0 07/13/2022: TSH 0.447 07/14/2022: ALT 25; AST 26; BUN 18; Creatinine, Ser 0.94; Hemoglobin 10.5; Magnesium 2.0; Platelets 211; Potassium 4.0; Sodium 135     Component Value Date/Time   CHOL 168 10/10/2019 0701   CHOL 204 (H) 10/30/2013 0501   TRIG 54 10/10/2019 0701   TRIG 240 (H) 10/30/2013 0501   HDL 37 (L) 10/10/2019 0701   HDL 38 (L) 10/30/2013 0501   CHOLHDL 4.5 10/10/2019 0701   VLDL 11 10/10/2019 0701   VLDL 48 (H) 10/30/2013 0501   LDLCALC 120 (H) 10/10/2019 0701   LDLCALC 118 (H) 10/30/2013 0501    Other Studies  Reviewed Today: Echocardiogram in 07/13/2022 LVEF 30 to 35% with RWMA Indeterminate LV diastolic parameters RV systolic function is normal LA mildly dilated Mild to moderate MR  Assessment and Plan: Patient is a 82 year old F known to have chronic systolic heart failure with LVEF 30 to 35% and RWMA, HTN, DM 2, HLD, history of SVT was referred to cardiology clinic for evaluation of systolic heart failure.  # History of SVT -Event monitor from 03/2019 showed atrial tachycardia for which medical management was recommended. I reviewed the cardiac monitoring strips from the hospitalization in April 2024 that showed SVT and not atrial fibrillation. There was a possibility of atrial flutter but the baseline artifact made it difficult to interpret. I will obtain 2-week event monitor for any intermittent atrial arrhythmias. Eliquis 5 mg twice daily was continued chronically due to history of possible atrial fibrillation or atrial flutter. Continue metoprolol tartrate 25 mg twice daily.  # Chronic systolic heart failure LVEF 30-35% with RWMA -Patient has no symptoms of DOE or angina.  She does not do much at home, walks only from bedroom to the living room. Continue metoprolol titrate 25 mg twice daily, switch lisinopril to Entresto 24-26 mg  twice daily (Entresto will need to be started 2 days after discontinuing lisinopril), start spironolactone 25 mg once daily (will need to be started after 5 days).  Obtain BMP in 10 days. -After GDMT initiation, will discuss about performing CTA cardiac in the next clinic visit for ischemia evaluation.  # HTN: Continue GDMT as stated above # HLD: Continue atorvastatin 20 mg nightly   I have spent a total of 45 minutes with patient reviewing chart, EKGs, labs and examining patient as well as establishing an assessment and plan that was discussed with the patient.  > 50% of time was spent in direct patient care.    Medication Adjustments/Labs and Tests Ordered: Current medicines are reviewed at length with the patient today.  Concerns regarding medicines are outlined above.   Tests Ordered: Orders Placed This Encounter  Procedures   Basic metabolic panel   LONG TERM MONITOR (3-14 DAYS)    Medication Changes: Meds ordered this encounter  Medications   sacubitril-valsartan (ENTRESTO) 24-26 MG    Sig: Take 1 tablet by mouth 2 (two) times daily.    Dispense:  60 tablet    Refill:  11   spironolactone (ALDACTONE) 25 MG tablet    Sig: Take 1 tablet (25 mg total) by mouth daily.    Dispense:  90 tablet    Refill:  3    Disposition:  Follow up  3 months  Signed, Ludwika Rodd Verne Spurr, MD, 07/27/2022 12:15 PM    [facility] [address] [place], [place] [address]

## 2022-07-27 NOTE — Patient Instructions (Signed)
Medication Instructions:  Your physician has recommended you make the following change in your medication:   -Stop Lisinopril  - Start Entresto 24-26 mg tablets twice daily- Start 2 days after stopping Lisinopril -Start Spironolactone 25 mg tablets once daily- Start 5 days after stopping Lisinopril  *If you need a refill on your cardiac medications before your next appointment, please call your pharmacy*   Lab Work: In 10 days: -BMET  If you have labs (blood work) drawn today and your tests are completely normal, you will receive your results only by: MyChart Message (if you have MyChart) OR A paper copy in the mail If you have any lab test that is abnormal or we need to change your treatment, we will call you to review the results.   Testing/Procedures: 14 day Zio   Follow-Up: At [facility], you and your health needs are our priority.  As part of our continuing mission to provide you with exceptional heart care, we have created designated Provider Care Teams.  These Care Teams include your primary Cardiologist (physician) and Advanced Practice Providers (APPs -  Physician Assistants and Nurse Practitioners) who all work together to provide you with the care you need, when you need it.  We recommend signing up for the patient portal called "MyChart".  Sign up information is provided on this After Visit Summary.  MyChart is used to connect with patients for Virtual Visits (Telemedicine).  Patients are able to view lab/test results, encounter notes, upcoming appointments, etc.  Non-urgent messages can be sent to your provider as well.   To learn more about what you can do with MyChart, go to [email or web address].    Your next appointment:   3 month(s)  Provider:   Luane School, MD    Other Instructions

## 2022-08-04 NOTE — Progress Notes (Addendum)
Virtual Visit via Video Note  I connected with [name] on 08/05/22 at  9:00 AM EDT by a video enabled telemedicine application and verified that I am speaking with the correct person using two identifiers.  Location: Patient: home Provider: office Persons participated in the visit- patient, provider    I discussed the limitations of evaluation and management by telemedicine and the availability of in person appointments. The patient expressed understanding and agreed to proceed.    I discussed the assessment and treatment plan with the patient. The patient was provided an opportunity to ask questions and all were answered. The patient agreed with the plan and demonstrated an understanding of the instructions.   The patient was advised to call back or seek an in-person evaluation if the symptoms worsen or if the condition fails to improve as anticipated.  I provided 20 minutes of non-face-to-face time during this encounter.   [name], MD   [facility] MD/PA/NP OP Progress Note  08/05/2022 9:38 AM [name]  MRN:  [id number]  Chief Complaint:  Chief Complaint  Patient presents with   Follow-up   HPI:  According to the chart review, the following events have occurred since the last visit: The patient was seen by cardiologist after being admitted to sepsis due to COVID "-Event monitor from 03/2019 showed atrial tachycardia for which medical management was recommended. I reviewed the cardiac monitoring strips from the hospitalization in April 2024 that showed SVT and not atrial fibrillation. There was a possibility of atrial flutter but the baseline artifact made it difficult to interpret. I will obtain 2-week event monitor for any intermittent atrial arrhythmias. Eliquis 5 mg twice daily was continued chronically due to history of possible atrial fibrillation or atrial flutter. Continue metoprolol tartrate 25 mg twice daily. "  This is a follow-up appointment for  schizophrenia and cognitive disorder.  The patient is a poor historian.  She states that she does not know when she was asked about the recent hospitalization.  However, she later states that she had COVID.  She talks about her recent, named Martie Lee, stating issues about her, then states that she is okay. She talks about lord, and others doing something. She dreamed about a dog, and ruminates on episodes about this dog.   [name], the staff presents to the visit.  Darlen does not sleep at night at all.  She has conversation with other resident, who wakes up early in the morning to go to dialysis.  Jazzel talks about the other resident, stating that he wants to drink her pee. Drothoy is picky about what she eats. She has hallucinations. No violence. She takes medication regularly. Although [name] asks if she can be added medication for dementia, she agrees to hold it due to its potential risk on cardiac condition.   Single, no children. She was born and grew up close to [facility]. She has six siblings Work: unemployed. used to work for [employer], 20's  Visit Diagnosis:    ICD-10-CM   1. Schizophrenia, unspecified type (HCC)  F20.9 Valproic acid level    CBC    Hepatic function panel    2. Neurocognitive disorder  R41.9 Vitamin B12    Folate      Past Psychiatric History: Please see initial evaluation for full details. I have reviewed the history. No updates at this time.     Past Medical History:  Past Medical History:  Diagnosis Date   Bipolar 1 disorder (HCC)    Essential  hypertension    GERD (gastroesophageal reflux disease)    Hyperlipidemia    Irregular heart beat    Schizophrenia (HCC)    Type 2 diabetes mellitus (HCC)     Past Surgical History:  Procedure Laterality Date   YAG LASER APPLICATION Left 01/06/2015   Procedure: YAG LASER APPLICATION;  Surgeon: Jethro Bolus, MD;  Location: [facility];  Service: Ophthalmology;  Laterality: Left;    Family Psychiatric History: Please  see initial evaluation for full details. I have reviewed the history. No updates at this time.     Family History:  Family History  Problem Relation Age of Onset   Hypertension Father    Diabetes type II Father     Social History:  Social History   Socioeconomic History   Marital status: Single    Spouse name: Not on file   Number of children: Not on file   Years of education: Not on file   Highest education level: Not on file  Occupational History   Not on file  Tobacco Use   Smoking status: Never   Smokeless tobacco: Never  Vaping Use   Vaping Use: Never used  Substance and Sexual Activity   Alcohol use: No   Drug use: No   Sexual activity: Never  Other Topics Concern   Not on file  Social History Narrative   Not on file   Social Determinants of Health   Financial Resource Strain: Not on file  Food Insecurity: No Food Insecurity (07/12/2022)   Hunger Vital Sign    Worried About Running Out of Food in the Last Year: Never true    Ran Out of Food in the Last Year: Never true  Transportation Needs: No Transportation Needs (07/12/2022)   PRAPARE - Administrator, Civil Service (Medical): No    Lack of Transportation (Non-Medical): No  Physical Activity: Not on file  Stress: Not on file  Social Connections: Not on file    Allergies:  Allergies  Allergen Reactions   Haloperidol Lactate Other (See Comments)    Couldn't urinate anymore    Metabolic Disorder Labs: Lab Results  Component Value Date   HGBA1C 6.2 (H) 07/12/2022   MPG 131.24 07/12/2022   MPG 134.11 10/03/2019   No results found for: "PROLACTIN" Lab Results  Component Value Date   CHOL 168 10/10/2019   TRIG 54 10/10/2019   HDL 37 (L) 10/10/2019   CHOLHDL 4.5 10/10/2019   VLDL 11 10/10/2019   LDLCALC 120 (H) 10/10/2019   LDLCALC 99 10/06/2019   Lab Results  Component Value Date   TSH 0.447 07/13/2022   TSH 1.481 10/06/2019    Therapeutic Level Labs: No results found for:  "LITHIUM" Lab Results  Component Value Date   VALPROATE 64 06/13/2018   VALPROATE 86.5 02/15/2018   No results found for: "CBMZ"  Current Medications: Current Outpatient Medications  Medication Sig Dispense Refill   divalproex (DEPAKOTE ER) 250 MG 24 hr tablet Take 1 tablet (250 mg total) by mouth daily. 30 tablet 1   albuterol (VENTOLIN HFA) 108 (90 Base) MCG/ACT inhaler Inhale into the lungs.     apixaban (ELIQUIS) 5 MG TABS tablet Take 1 tablet (5 mg total) by mouth 2 (two) times daily. 60 tablet    ascorbic acid (VITAMIN C) 500 MG tablet Take 1 tablet (500 mg total) by mouth daily. (Patient not taking: Reported on 07/27/2022) 30 tablet 0   atorvastatin (LIPITOR) 20 MG tablet Take 20 mg by  mouth daily.     Camphor-Eucalyptus-Menthol (VICKS VAPORUB) 4.7-1.2-2.6 % OINT Apply 1 application topically daily. Apply once to toenails daily     cholecalciferol (VITAMIN D3) 10 MCG (400 UNIT) TABS tablet Take 400 Units by mouth.     docusate sodium (COLACE) 100 MG capsule Take 200 mg by mouth daily.     FEROSUL 325 (65 Fe) MG tablet Take 325 mg by mouth daily with breakfast.     gabapentin (NEURONTIN) 400 MG capsule Take 400 mg by mouth at bedtime.     latanoprost (XALATAN) 0.005 % ophthalmic solution Place 1 drop into both eyes at bedtime.     loratadine (CLARITIN) 10 MG tablet Take 10 mg by mouth daily.     metFORMIN (GLUCOPHAGE) 500 MG tablet Take 500 mg by mouth 2 (two) times daily with a meal.     metoprolol tartrate (LOPRESSOR) 25 MG tablet Take 1 tablet (25 mg total) by mouth 2 (two) times daily.     Multiple Vitamin (MULTIVITAMIN) tablet Take 1 tablet by mouth daily.     NYAMYC powder Apply 1 Application topically 3 (three) times daily.     OLANZapine (ZYPREXA) 20 MG tablet Take 1 tablet (20 mg total) by mouth at bedtime. Total of 25 mg at night. Take along with 20 mg tab 30 tablet 0   OLANZapine (ZYPREXA) 5 MG tablet Take 1 tablet (5 mg total) by mouth at bedtime. Total of 25 mg at night.  Take along with 20 mg tab 30 tablet 0   pantoprazole (PROTONIX) 40 MG tablet Take 40 mg by mouth daily.     polyethylene glycol powder (GLYCOLAX/MIRALAX) powder Take 17 g by mouth daily as needed for mild constipation or moderate constipation. (Patient not taking: Reported on 07/27/2022)     sacubitril-valsartan (ENTRESTO) 24-26 MG Take 1 tablet by mouth 2 (two) times daily. 60 tablet 11   senna-docusate (SENOKOT-S) 8.6-50 MG tablet Take 2 tablets by mouth at bedtime. 60 tablet 0   spironolactone (ALDACTONE) 25 MG tablet Take 1 tablet (25 mg total) by mouth daily. 90 tablet 3   vitamin B-12 (CYANOCOBALAMIN) 500 MCG tablet Take 1 tablet (500 mcg total) by mouth daily.     zinc sulfate 220 (50 Zn) MG capsule Take 1 capsule (220 mg total) by mouth daily. (Patient not taking: Reported on 07/27/2022) 30 capsule 0   zolpidem (AMBIEN) 5 MG tablet Take 1 tablet (5 mg total) by mouth at bedtime as needed for sleep. 10 tablet 0   No current facility-administered medications for this visit.     Musculoskeletal: Strength & Muscle Tone:  N/A Gait & Station:  N/A Patient leans: N/A  Psychiatric Specialty Exam: Review of Systems  Psychiatric/Behavioral:  Positive for behavioral problems, confusion, hallucinations and sleep disturbance. Negative for agitation, decreased concentration, dysphoric mood, self-injury and suicidal ideas. The patient is not nervous/anxious and is not hyperactive.   All other systems reviewed and are negative.   There were no vitals taken for this visit.There is no height or weight on file to calculate BMI.  General Appearance: Fairly Groomed  Eye Contact:  Good  Speech:  Garbled  Volume:  Normal  Mood:   good  Affect:  Appropriate, Congruent, and calm  Thought Process:  Irrelevant  Orientation:  Full (Time, Place, and Person)  Thought Content: Logical   Suicidal Thoughts:  No  Homicidal Thoughts:  No  Memory:  Remote;   Poor  Judgement:  Fair  Insight:  Shallow  Psychomotor Activity:  Normal  Concentration:  Concentration: Good and Attention Span: Good  Recall:  Poor  Fund of Knowledge: Good  Language: Good  Akathisia:  No  Handed:  Right  AIMS (if indicated): not done  Assets:  Social Support  ADL's:  Impaired  Cognition: Impaired,  Mild  Sleep:  Poor   Screenings: Equities trader Office Visit from 09/06/2021 in [facility] Office Visit from 10/12/2020 in [facility] Video Visit from 07/10/2020 in [facility]  PHQ-2 Total Score 3 0 1  PHQ-9 Total Score 14 -- --      Flowsheet Row ED to Hosp-Admission (Discharged) from 07/12/2022 in [facility] Office Visit from 09/06/2021 in [name] Office Visit from 10/12/2020 in [facility]  C-SSRS RISK CATEGORY No Risk Error: Q3, 4, or 5 should not be populated when Q2 is No No Risk        Assessment and Plan:  SHANOAH BUENDIA is a 82 y.o. year old female with a history of schizophrenia, neurocognitive disorder, hypertension, type II diabetes, who presents for follow up appointment for below.   1. Schizophrenia, unspecified type (HCC) Acute stressors include:  Other stressors include:    History: dx with schizophrenia yearly in her life, [facility] in 10/2013. Per chart, ""She was petitioned by her family, her 72 year old aunt with whom she lives, for behaving strangely, talking to herself, throwing her medicines out, putting a spell on Dr. Elesa Massed, who is a psychiatrist. In the Emergency Room, the patient has been talking constantly, apparently delusional, paranoid and grandiose, telling me that the [facility] cured her."  Originally on Depakote 750 mg qhs, risperidone 2 mg BID, olanzapine 10 mg qhs, cogentin 1 mg BID Exam is notable for disorganized thought process, and the staff  reports worsening in behavioral disturbances.  Given her recent cardiac condition, will not plan to uptitrate olanzapine at this time to mitigate the risk.  Will start Depakote for off label use for agitation.  Discussed potential risk of drowsiness.   2. Neurocognitive disorder                Functional Status Instrumental Activities of Daily Living (IADLs):  PENNIE SHAW is independent in the following:  Requires assistance with the following: managing finances, medications, driving Activities of Daily Living (ADLs):  AKEILA YAROSH is independent in the following:  feeding, continence, walking (walker), grooming                Requires assistance with the following: bathing,  toileting (occasionally have incontinence)                 Folate, Vtamin B12, TSH                Head MRI in 2021: punctate acute/early subacute infarct within the right frontal lobe precentral gyrus, and mild generalized parenchymal atrophy and chronic small vessel ischemic disease.                 Neuropsych assessment: Mini cog- "cat" 1/3, clock- 1/3 (draw circle, put numbers of 12,5,10,13..), orientation 4/5 (except date) on 10/2017                Etiology: vascular, r/o alzheimer    Worsening in behavior disturbances and insomnia.  Will start Depakote as described above.  Will  obtain labs to rule out medical health issues contributing to this. Will continue current dose of olanzapine to target neuropsychiatric symptoms of paranoia and insomnia.  We've opted not to initiate medication for dementia due to the risk of polypharmacy outweighing its potential benefits.    Plan Continue olanzapine 25 mg at night  EKG NSR, HR 96, QTc 467 msec 02/2022 Start Depakote ER 250 mg at night  Obtain labs (valproic acid, CBC, LFT vitamin B12, folate) Ms. Manesha will send a note from PCP after the visit in March Next appointment: 6/12, video [name]. [phone]) - on vitamin b 12, vitamin D3  - she will have pcp visit in  March 2024. Will consider checking vitamin b12, folate if those are not done.   (She is on Ambien 5 mg)    The patient demonstrates the following risk factors for suicide: Chronic risk factors for suicide include: psychiatric disorder of schizophrenia. Acute risk factors for suicide include: unemployment. Protective factors for this patient include: positive social support. Considering these factors, the overall suicide risk at this point appears to be low. Patient is appropriate for outpatient follow up.    Collaboration of Care: Collaboration of Care: Other reviewed notes in Epic  Patient/Guardian was advised Release of Information must be obtained prior to any record release in order to collaborate their care with an outside provider. Patient/Guardian was advised if they have not already done so to contact the registration department to sign all necessary forms in order for [place] to release information regarding their care.   Consent: Patient/Guardian gives verbal consent for treatment and assignment of benefits for services provided during this visit. Patient/Guardian expressed understanding and agreed to proceed.    [name], MD 08/05/2022, 9:38 AM

## 2022-08-05 ENCOUNTER — Encounter: Payer: Self-pay | Admitting: Psychiatry

## 2022-08-05 ENCOUNTER — Telehealth (INDEPENDENT_AMBULATORY_CARE_PROVIDER_SITE_OTHER): Payer: Medicare (Managed Care) | Admitting: Psychiatry

## 2022-08-05 DIAGNOSIS — F209 Schizophrenia, unspecified: Secondary | ICD-10-CM

## 2022-08-05 DIAGNOSIS — R419 Unspecified symptoms and signs involving cognitive functions and awareness: Secondary | ICD-10-CM | POA: Diagnosis not present

## 2022-08-05 MED ORDER — DIVALPROEX SODIUM ER 250 MG PO TB24: 250.0000 mg | ORAL_TABLET | Freq: Every day | ORAL | 1 refills | Status: DC

## 2022-08-12 ENCOUNTER — Telehealth: Payer: Self-pay

## 2022-08-12 LAB — HEPATIC FUNCTION PANEL
ALT: 14 IU/L (ref 0–32)
AST: 17 IU/L (ref 0–40)
Albumin: 4 g/dL (ref 3.8–4.8)
Alkaline Phosphatase: 101 IU/L (ref 44–121)
Bilirubin Total: <0.2 mg/dL (ref 0.0–1.2)
Bilirubin, Direct: <0.1 mg/dL (ref 0.00–0.40)
Total Protein: 6.4 g/dL (ref 6.0–8.5)

## 2022-08-12 LAB — CBC
Hematocrit: 30.7 % — ABNORMAL LOW (ref 34.0–46.6)
Hemoglobin: 10.2 g/dL — ABNORMAL LOW (ref 11.1–15.9)
MCH: 28.3 pg (ref 26.6–33.0)
MCHC: 33.2 g/dL (ref 31.5–35.7)
MCV: 85 fL (ref 79–97)
Platelets: 278 10*3/uL (ref 150–450)
RBC: 3.61 x10E6/uL — ABNORMAL LOW (ref 3.77–5.28)
RDW: 13.4 % (ref 11.7–15.4)
WBC: 9.1 10*3/uL (ref 3.4–10.8)

## 2022-08-12 LAB — VITAMIN B12: Vitamin B-12: >2000 pg/mL — ABNORMAL HIGH (ref 232–1245)

## 2022-08-12 LAB — FOLATE: Folate: >20 ng/mL (ref >3.0)

## 2022-08-12 LAB — VALPROIC ACID LEVEL: Valproic Acid Lvl: 20 ug/mL — ABNORMAL LOW (ref 50–100)

## 2022-08-12 NOTE — Progress Notes (Signed)
Called the facility spoke to [place] informed her of the patients lab results she voiced understanding

## 2022-08-12 NOTE — Telephone Encounter (Signed)
care giver notified or test resutls and recommendation of following up with PCP

## 2022-08-12 NOTE — Telephone Encounter (Signed)
-----   Message from [name], MD sent at 08/12/2022  7:45 AM EDT ----- Please advise the facility that she has anemia. I recommend she follow up with her PCP for these issues. Additionally, I suggest she continues the current dose of Depakote at this time. Other labs are within acceptable range.

## 2022-08-12 NOTE — Progress Notes (Signed)
Please advise the facility that she has anemia. I recommend she follow up with her PCP for these issues. Additionally, I suggest she continues the current dose of Depakote at this time. Other labs are within acceptable range.

## 2022-08-15 NOTE — Patient Instructions (Addendum)
Continue olanzapine 25 mg at night  Start Depakote ER 250 mg at night  Obtain labs (valproic acid, CBC, LFT vitamin B12, folate) Please send a note from recent PCP visit

## 2022-08-17 ENCOUNTER — Encounter: Payer: Self-pay | Admitting: Internal Medicine

## 2022-08-17 ENCOUNTER — Telehealth: Payer: Self-pay | Admitting: Internal Medicine

## 2022-08-17 NOTE — Telephone Encounter (Signed)
[employer] is calling to report abnormal results.   Call transferred to LPN.

## 2022-08-17 NOTE — Telephone Encounter (Signed)
Received a call from [place] with [employer]. Pt had 402 episodes of SVT longest being 3 min. Max hr of 240 and average. Faxed copy sent to the office.  Will send to DOD for review.

## 2022-08-25 ENCOUNTER — Other Ambulatory Visit: Payer: Self-pay | Admitting: Psychiatry

## 2022-09-04 NOTE — Progress Notes (Unsigned)
Virtual Visit via Video Note  I connected with Maria Pace on 09/07/22 at  3:00 PM EDT by a video enabled telemedicine application and verified that I am speaking with the correct person using two identifiers.  Location: Patient: home Provider: office Persons participated in the visit- patient, provider    I discussed the limitations of evaluation and management by telemedicine and the availability of in person appointments. The patient expressed understanding and agreed to proceed.    I discussed the assessment and treatment plan with the patient. The patient was provided an opportunity to ask questions and all were answered. The patient agreed with the plan and demonstrated an understanding of the instructions.   The patient was advised to call back or seek an in-person evaluation if the symptoms worsen or if the condition fails to improve as anticipated.  I provided 15 minutes of non-face-to-face time during this encounter.   Maria Hotter, MD    [facility] MD/PA/NP OP Progress Note  09/07/2022 3:36 PM Maria Pace  MRN:  [id number]  Chief Complaint:  Chief Complaint  Patient presents with   Follow-up   HPI:  This is a follow-up appointment for schizophrenia and neurocognitive disorder.  She states that she has been doing.  She does not do crocheting anymore, although she does not know why.  She does more drawing during the day.  She believes her mood has been pretty stable.  She states that she does not sleep as she is a spirit.  She hears God, father and son.  She denies any issues with other residents or staff.  She denies paranoia.  She denies SI, HI.   [name] presents to the interview.  She has been sleeping well since starting Depakote.  She continues to not getting along with the other residents.  She occasionally health that some resident is stealing her items when she lost something.  There is no aggression or safety concern.  She feels comfortable for her to stay on  the current medication regimen as it is.   Single, no children. She was born and grew up close to [facility]. She has six siblings Work: unemployed. used to work for [employer], 20's   Visit Diagnosis:    ICD-10-CM   1. Schizophrenia, unspecified type (HCC)  F20.9     2. Neurocognitive disorder  R41.9       Past Psychiatric History: Please see initial evaluation for full details. I have reviewed the history. No updates at this time.     Past Medical History:  Past Medical History:  Diagnosis Date   Bipolar 1 disorder (HCC)    Essential hypertension    GERD (gastroesophageal reflux disease)    Hyperlipidemia    Irregular heart beat    Schizophrenia (HCC)    Type 2 diabetes mellitus (HCC)     Past Surgical History:  Procedure Laterality Date   YAG LASER APPLICATION Left 01/06/2015   Procedure: YAG LASER APPLICATION;  Surgeon: Jethro Bolus, MD;  Location: [facility];  Service: Ophthalmology;  Laterality: Left;    Family Psychiatric History: Please see initial evaluation for full details. I have reviewed the history. No updates at this time.     Family History:  Family History  Problem Relation Age of Onset   Hypertension Father    Diabetes type II Father     Social History:  Social History   Socioeconomic History   Marital status: Single    Spouse name: Not on file  Number of children: Not on file   Years of education: Not on file   Highest education level: Not on file  Occupational History   Not on file  Tobacco Use   Smoking status: Never   Smokeless tobacco: Never  Vaping Use   Vaping Use: Never used  Substance and Sexual Activity   Alcohol use: No   Drug use: No   Sexual activity: Never  Other Topics Concern   Not on file  Social History Narrative   Not on file   Social Determinants of Health   Financial Resource Strain: Not on file  Food Insecurity: No Food Insecurity (07/12/2022)   Hunger Vital Sign    Worried About Running Out of Food in the Last  Year: Never true    Ran Out of Food in the Last Year: Never true  Transportation Needs: No Transportation Needs (07/12/2022)   PRAPARE - Administrator, Civil Service (Medical): No    Lack of Transportation (Non-Medical): No  Physical Activity: Not on file  Stress: Not on file  Social Connections: Not on file    Allergies:  Allergies  Allergen Reactions   Haloperidol Lactate Other (See Comments)    Couldn't urinate anymore    Metabolic Disorder Labs: Lab Results  Component Value Date   HGBA1C 6.2 (H) 07/12/2022   MPG 131.24 07/12/2022   MPG 134.11 10/03/2019   No results found for: "PROLACTIN" Lab Results  Component Value Date   CHOL 168 10/10/2019   TRIG 54 10/10/2019   HDL 37 (L) 10/10/2019   CHOLHDL 4.5 10/10/2019   VLDL 11 10/10/2019   LDLCALC 120 (H) 10/10/2019   LDLCALC 99 10/06/2019   Lab Results  Component Value Date   TSH 0.447 07/13/2022   TSH 1.481 10/06/2019    Therapeutic Level Labs: No results found for: "LITHIUM" Lab Results  Component Value Date   VALPROATE 20 (L) 08/11/2022   VALPROATE 64 06/13/2018   No results found for: "CBMZ"  Current Medications: Current Outpatient Medications  Medication Sig Dispense Refill   albuterol (VENTOLIN HFA) 108 (90 Base) MCG/ACT inhaler Inhale into the lungs.     apixaban (ELIQUIS) 5 MG TABS tablet Take 1 tablet (5 mg total) by mouth 2 (two) times daily. 60 tablet    atorvastatin (LIPITOR) 20 MG tablet Take 20 mg by mouth daily.     Camphor-Eucalyptus-Menthol (VICKS VAPORUB) 4.7-1.2-2.6 % OINT Apply 1 application topically daily. Apply once to toenails daily     cholecalciferol (VITAMIN D3) 10 MCG (400 UNIT) TABS tablet Take 400 Units by mouth.     [START ON 10/04/2022] divalproex (DEPAKOTE ER) 250 MG 24 hr tablet Take 1 tablet (250 mg total) by mouth daily. 30 tablet 1   docusate sodium (COLACE) 100 MG capsule Take 200 mg by mouth daily.     FEROSUL 325 (65 Fe) MG tablet Take 325 mg by mouth 2  (two) times daily with a meal.     gabapentin (NEURONTIN) 400 MG capsule Take 400 mg by mouth at bedtime.     latanoprost (XALATAN) 0.005 % ophthalmic solution Place 1 drop into both eyes at bedtime.     loratadine (CLARITIN) 10 MG tablet Take 10 mg by mouth daily.     metFORMIN (GLUCOPHAGE) 500 MG tablet Take 500 mg by mouth 2 (two) times daily with a meal.     metoprolol tartrate (LOPRESSOR) 25 MG tablet Take 1 tablet (25 mg total) by mouth 2 (two) times daily.  Multiple Vitamin (MULTIVITAMIN) tablet Take 1 tablet by mouth daily.     NYAMYC powder Apply 1 Application topically 3 (three) times daily.     [START ON 09/12/2022] OLANZapine (ZYPREXA) 20 MG tablet Take 1 tablet (20 mg total) by mouth at bedtime. Total of 25 mg at night. Take along with 20 mg tab 30 tablet 1   [START ON 09/12/2022] OLANZapine (ZYPREXA) 5 MG tablet Take 1 tablet (5 mg total) by mouth at bedtime. Total of 25 mg at night. Take along with 20 mg tab 30 tablet 1   pantoprazole (PROTONIX) 40 MG tablet Take 40 mg by mouth daily.     polyethylene glycol powder (GLYCOLAX/MIRALAX) powder Take 17 g by mouth daily as needed for mild constipation or moderate constipation. (Patient not taking: Reported on 07/27/2022)     sacubitril-valsartan (ENTRESTO) 24-26 MG Take 1 tablet by mouth 2 (two) times daily. 60 tablet 11   senna-docusate (SENOKOT-S) 8.6-50 MG tablet Take 2 tablets by mouth at bedtime. 60 tablet 0   spironolactone (ALDACTONE) 25 MG tablet Take 1 tablet (25 mg total) by mouth daily. 90 tablet 3   vitamin B-12 (CYANOCOBALAMIN) 500 MCG tablet Take 1 tablet (500 mcg total) by mouth daily.     zolpidem (AMBIEN) 5 MG tablet Take 1 tablet (5 mg total) by mouth at bedtime as needed for sleep. 10 tablet 0   No current facility-administered medications for this visit.     Musculoskeletal: Strength & Muscle Tone:  N/A Gait & Station:  N/A Patient leans: N/A  Psychiatric Specialty Exam: Review of Systems   Psychiatric/Behavioral:  Positive for behavioral problems and hallucinations. Negative for agitation, confusion, decreased concentration, dysphoric mood, self-injury, sleep disturbance and suicidal ideas. The patient is not nervous/anxious and is not hyperactive.   All other systems reviewed and are negative.   There were no vitals taken for this visit.There is no height or weight on file to calculate BMI.  General Appearance: Fairly Groomed  Eye Contact:  Good  Speech:  Clear and Coherent  Volume:  Normal  Mood:   fine  Affect:  Appropriate, Congruent, and Restricted  Thought Process:  Coherent and Disorganized  Orientation:  Full (Time, Place, and Person)  Thought Content: Logical and Paranoid Ideation   Suicidal Thoughts:  No  Homicidal Thoughts:  No  Memory:  Immediate;   Fair  Judgement:  Fair  Insight:  Shallow  Psychomotor Activity:  Normal  Concentration:  Concentration: Good and Attention Span: Good  Recall:  Poor  Fund of Knowledge: Good  Language: Good  Akathisia:  No  Handed:  Right  AIMS (if indicated): not done  Assets:  Communication Skills Desire for Improvement  ADL's:  Impaired  Cognition: WNL  Sleep:  Fair   Screenings: Equities trader Office Visit from 09/06/2021 in [facility] Office Visit from 10/12/2020 in [facility] Video Visit from 07/10/2020 in [facility]  PHQ-2 Total Score 3 0 1  PHQ-9 Total Score 14 -- --      Flowsheet Row ED to Hosp-Admission (Discharged) from 07/12/2022 in [facility] Office Visit from 09/06/2021 in [facility] Office Visit from 10/12/2020 in [facility]  C-SSRS RISK CATEGORY No Risk Error: Q3, 4, or 5 should not be populated when Q2 is No No Risk        Assessment and  Plan:  Maria Pace is  a 82 y.o. year old female with a history of schizophrenia, neurocognitive disorder, hypertension, type II diabetes, who presents for follow up appointment for below.   1. Schizophrenia, unspecified type (HCC) Acute stressors include:  Other stressors include:    History: dx with schizophrenia yearly in her life, [facility] in 10/2013. Per chart, ""She was petitioned by her family, her 46 year old aunt with whom she lives, for behaving strangely, talking to herself, throwing her medicines out, putting a spell on Dr. Elesa Massed, who is a psychiatrist. In the Emergency Room, the patient has been talking constantly, apparently delusional, paranoid and grandiose, telling me that the [facility] cured her."  Originally on Depakote 750 mg qhs, risperidone 2 mg BID, olanzapine 10 mg qhs, cogentin 1 mg BID Although she continues to have occasional disorganized thought process, there has been overall improvement in behavior issues since starting depakote.  Will continue current dose of olanzapine to target schizophrenia, and Depakote for mood dysregulation. The clinician has discussed the side effects associated with the medication prescribed during this encounter. Please refer to the notes from previous encounters for further details.   2. Neurocognitive disorder Instrumental Activities of Daily Living (IADLs):  Requires assistance with the following: managing finances, medications, driving Activities of Daily Living (ADLs):   independent in the following:  feeding, continence, walking (walker), grooming Requires assistance with the following: bathing,  toileting (occasionally have incontinence)    Folate, Vtamin B12, TSH wnl 4-07/2022   Head MRI in 2021: punctate acute/early subacute infarct within the right frontal lobe precentral gyrus, and mild generalized parenchymal atrophy and chronic small vessel ischemic disease.    Neuropsych assessment: Mini cog- "cat" 1/3, clock- 1/3 (draw circle, put numbers of 12,5,10,13..),  orientation 4/5 (except date) on 10/2017   Etiology: vascular, r/o alzheimer     Significant improvement in nighttime behavior, and behavior disturbances since starting Depakote.  Will continue current dose to target neuropsychiatric symptoms. We've opted not to initiate medication for dementia due to the risk of polypharmacy outweighing its potential benefits.   Plan Continue olanzapine 25 mg at night  EKG NSR, HR 96, QTc 467 msec 02/2022 Continue Depakote ER 250 mg at night  Obtain ROI to get record from PCP for review Next appointment: 8/8 at 11 am, video Maria Pace. [phone]) - on vitamin b 12, vitamin D3  - she will have pcp visit in March 2024. Will consider checking vitamin b12, folate if those are not done.   (She is on Ambien 5 mg)    The patient demonstrates the following risk factors for suicide: Chronic risk factors for suicide include: psychiatric disorder of schizophrenia. Acute risk factors for suicide include: unemployment. Protective factors for this patient include: positive social support. Considering these factors, the overall suicide risk at this point appears to be low. Patient is appropriate for outpatient follow up.   Collaboration of Care: Collaboration of Care: Other reviewed notes in Epic  Patient/Guardian was advised Release of Information must be obtained prior to any record release in order to collaborate their care with an outside provider. Patient/Guardian was advised if they have not already done so to contact the registration department to sign all necessary forms in order for [place] to release information regarding their care.   Consent: Patient/Guardian gives verbal consent for treatment and assignment of benefits for services provided during this visit. Patient/Guardian expressed understanding and agreed to proceed.    Maria Hotter, MD 09/07/2022, 3:36 PM

## 2022-09-07 ENCOUNTER — Telehealth (INDEPENDENT_AMBULATORY_CARE_PROVIDER_SITE_OTHER): Payer: Medicare (Managed Care) | Admitting: Psychiatry

## 2022-09-07 ENCOUNTER — Encounter: Payer: Self-pay | Admitting: Psychiatry

## 2022-09-07 DIAGNOSIS — R419 Unspecified symptoms and signs involving cognitive functions and awareness: Secondary | ICD-10-CM | POA: Diagnosis not present

## 2022-09-07 DIAGNOSIS — F209 Schizophrenia, unspecified: Secondary | ICD-10-CM

## 2022-09-07 MED ORDER — OLANZAPINE 5 MG PO TABS: 5.0000 mg | ORAL_TABLET | Freq: Every day | ORAL | 1 refills | Status: DC

## 2022-09-07 MED ORDER — OLANZAPINE 20 MG PO TABS: 20.0000 mg | ORAL_TABLET | Freq: Every day | ORAL | 1 refills | Status: DC

## 2022-09-07 MED ORDER — DIVALPROEX SODIUM ER 250 MG PO TB24: 250.0000 mg | ORAL_TABLET | Freq: Every day | ORAL | 1 refills | Status: DC

## 2022-09-26 ENCOUNTER — Telehealth: Payer: Self-pay | Admitting: Psychiatry

## 2022-09-26 NOTE — Telephone Encounter (Signed)
Reviewed a note from Dr. Felecia Shelling, documented on 05/2022. This will be scanned. There are no recent labs available for vitamin B12 or folate.

## 2022-10-29 NOTE — Progress Notes (Unsigned)
Virtual Visit via Video Note  I connected with Maria Pace on 11/03/22 at 11:00 AM EDT by a video enabled telemedicine application and verified that I am speaking with the correct person using two identifiers.  Location: Patient: group home Provider: office Persons participated in the visit- patient, provider    I discussed the limitations of evaluation and management by telemedicine and the availability of in person appointments. The patient expressed understanding and agreed to proceed.   I discussed the assessment and treatment plan with the patient. The patient was provided an opportunity to ask questions and all were answered. The patient agreed with the plan and demonstrated an understanding of the instructions.   The patient was advised to call back or seek an in-person evaluation if the symptoms worsen or if the condition fails to improve as anticipated.  I provided 20 minutes of non-face-to-face time during this encounter.   [name], MD    [facility] MD/PA/NP OP Progress Note  11/03/2022 11:41 AM Maria Pace  MRN:  [id number]  Chief Complaint:  Chief Complaint  Patient presents with   Follow-up   HPI:  This is a follow-up appointment for schizophrenia and neurocognitive disorder.   According to the chart review, the following events have occurred since the last visit: The patient was seen by cardiology for HF and Afib. Amiodarone was added, and entresto was uptitrated.  She states that she has been doing all right.  She wants to do a drawing.  However, she later states that she has been doing a drawing.  She is not doing crochet anymore as it is too much.  And she later states that she is doing crochet.  She states that there has been some misunderstanding with the staff.  When she was asked to elaborate, she states that it is complicated.  She denies hallucinations except she hears God's voice.  Although Maria Pace is the only one to help her, other staff does not listen  to her.  She has insomnia.  She has decrease in appetite.  She denies SI, HI.   Maria Pace presents to the visit.  She states that Maria Pace does not get along with others.  Although her sister tried to have a birthday party for her, she told her that she does not want to have.  However, when the day got close, she requested to have it.  She told others that they do not want her there, and she is better of dead.  She did not act on thoughts. Maria Pace also accuses other for stealing things from her, although she later finds those items.  She does not get along with her roommate, who stays together for 1 year.  There was a time she jumped on to [facility] when she was upset. The staffs are trying to redirect her. She has occasional insomnia.  She sees and hears dead people.    Visit Diagnosis:    ICD-10-CM   1. Schizophrenia, unspecified type (HCC)  F20.9     2. Neurocognitive disorder  R41.9       Past Psychiatric History: Please see initial evaluation for full details. I have reviewed the history. No updates at this time.     Past Medical History:  Past Medical History:  Diagnosis Date   Bipolar 1 disorder (HCC)    Essential hypertension    GERD (gastroesophageal reflux disease)    Hyperlipidemia    Irregular heart beat    Schizophrenia (HCC)    Type  2 diabetes mellitus [facility])     Past Surgical History:  Procedure Laterality Date   YAG LASER APPLICATION Left 01/06/2015   Procedure: YAG LASER APPLICATION;  Surgeon: Jethro Bolus, MD;  Location: [facility];  Service: Ophthalmology;  Laterality: Left;    Family Psychiatric History: Please see initial evaluation for full details. I have reviewed the history. No updates at this time.     Family History:  Family History  Problem Relation Age of Onset   Hypertension Father    Diabetes type II Father     Social History:  Social History   Socioeconomic History   Marital status: Single    Spouse name: Not on file   Number of children: Not  on file   Years of education: Not on file   Highest education level: Not on file  Occupational History   Not on file  Tobacco Use   Smoking status: Never   Smokeless tobacco: Never  Vaping Use   Vaping status: Never Used  Substance and Sexual Activity   Alcohol use: No   Drug use: No   Sexual activity: Never  Other Topics Concern   Not on file  Social History Narrative   Not on file   Social Determinants of Health   Financial Resource Strain: Not on file  Food Insecurity: No Food Insecurity (07/12/2022)   Hunger Vital Sign    Worried About Running Out of Food in the Last Year: Never true    Ran Out of Food in the Last Year: Never true  Transportation Needs: No Transportation Needs (07/12/2022)   PRAPARE - Administrator, Civil Service (Medical): No    Lack of Transportation (Non-Medical): No  Physical Activity: Not on file  Stress: Not on file  Social Connections: Not on file    Allergies:  Allergies  Allergen Reactions   Haloperidol Lactate Other (See Comments)    Couldn't urinate anymore    Metabolic Disorder Labs: Lab Results  Component Value Date   HGBA1C 6.2 (H) 07/12/2022   MPG 131.24 07/12/2022   MPG 134.11 10/03/2019   No results found for: "PROLACTIN" Lab Results  Component Value Date   CHOL 168 10/10/2019   TRIG 54 10/10/2019   HDL 37 (L) 10/10/2019   CHOLHDL 4.5 10/10/2019   VLDL 11 10/10/2019   LDLCALC 120 (H) 10/10/2019   LDLCALC 99 10/06/2019   Lab Results  Component Value Date   TSH 3.383 11/02/2022   TSH 0.447 07/13/2022    Therapeutic Level Labs: No results found for: "LITHIUM" Lab Results  Component Value Date   VALPROATE 20 (L) 08/11/2022   VALPROATE 64 06/13/2018   No results found for: "CBMZ"  Current Medications: Current Outpatient Medications  Medication Sig Dispense Refill   albuterol (VENTOLIN HFA) 108 (90 Base) MCG/ACT inhaler Inhale 1 puff into the lungs every 6 (six) hours as needed.     amiodarone  (PACERONE) 200 MG tablet Take 1 tablet (200 mg total) by mouth 2 (two) times daily for 21 days, THEN 1 tablet (200 mg total) daily. 60 tablet 6   apixaban (ELIQUIS) 5 MG TABS tablet Take 1 tablet (5 mg total) by mouth 2 (two) times daily. 60 tablet    ascorbic acid (VITAMIN C) 500 MG tablet Take 500 mg by mouth daily.     atorvastatin (LIPITOR) 20 MG tablet Take 20 mg by mouth daily.     Camphor-Eucalyptus-Menthol (VICKS VAPORUB) 4.7-1.2-2.6 % OINT Apply 1 application topically daily. Apply  once to toenails daily     cholecalciferol (VITAMIN D3) 10 MCG (400 UNIT) TABS tablet Take 1,000 Units by mouth daily.     [START ON 12/03/2022] divalproex (DEPAKOTE ER) 250 MG 24 hr tablet Take 1 tablet (250 mg total) by mouth daily. 30 tablet 2   docusate sodium (COLACE) 100 MG capsule Take 200 mg by mouth daily.     FEROSUL 325 (65 Fe) MG tablet Take 325 mg by mouth 2 (two) times daily with a meal.     gabapentin (NEURONTIN) 400 MG capsule Take 400 mg by mouth at bedtime.     latanoprost (XALATAN) 0.005 % ophthalmic solution Place 1 drop into both eyes at bedtime.     loratadine (CLARITIN) 10 MG tablet Take 10 mg by mouth daily.     metFORMIN (GLUCOPHAGE) 500 MG tablet Take 500 mg by mouth 2 (two) times daily with a meal.     metoprolol tartrate (LOPRESSOR) 25 MG tablet Take 1 tablet (25 mg total) by mouth 2 (two) times daily.     Multiple Vitamin (MULTIVITAMIN) tablet Take 1 tablet by mouth daily.     NYAMYC powder Apply 1 Application topically 3 (three) times daily.     [START ON 11/11/2022] OLANZapine (ZYPREXA) 20 MG tablet Take 1 tablet (20 mg total) by mouth at bedtime. Total of 25 mg at night. Take along with 20 mg tab 30 tablet 3   [START ON 11/11/2022] OLANZapine (ZYPREXA) 5 MG tablet Take 1 tablet (5 mg total) by mouth at bedtime. Total of 25 mg at night. Take along with 20 mg tab 30 tablet 3   pantoprazole (PROTONIX) 40 MG tablet Take 40 mg by mouth daily.     polyethylene glycol powder  (GLYCOLAX/MIRALAX) powder Take 17 g by mouth daily as needed for mild constipation or moderate constipation. (Patient not taking: Reported on 07/27/2022)     sacubitril-valsartan (ENTRESTO) 49-51 MG Take 1 tablet by mouth 2 (two) times daily. 60 tablet 6   senna-docusate (SENOKOT-S) 8.6-50 MG tablet Take 2 tablets by mouth at bedtime. 60 tablet 0   spironolactone (ALDACTONE) 25 MG tablet Take 1 tablet (25 mg total) by mouth daily. 90 tablet 3   vitamin B-12 (CYANOCOBALAMIN) 500 MCG tablet Take 1 tablet (500 mcg total) by mouth daily.     zinc sulfate 220 (50 Zn) MG capsule Take 220 mg by mouth daily.     zolpidem (AMBIEN) 5 MG tablet Take 1 tablet (5 mg total) by mouth at bedtime as needed for sleep. 10 tablet 0   No current facility-administered medications for this visit.     Musculoskeletal: Strength & Muscle Tone:  N/A Gait & Station:  N/A Patient leans: N/A  Psychiatric Specialty Exam: Review of Systems  Psychiatric/Behavioral:  Positive for behavioral problems and sleep disturbance. Negative for agitation, confusion, decreased concentration, dysphoric mood, hallucinations, self-injury and suicidal ideas. The patient is not nervous/anxious and is not hyperactive.   All other systems reviewed and are negative.   There were no vitals taken for this visit.There is no height or weight on file to calculate BMI.  General Appearance: Fairly Groomed  Eye Contact:  Good  Speech:  Clear and Coherent  Volume:  Normal  Mood:   fine  Affect:  Appropriate, Congruent, and Restricted  Thought Process:  Irrelevant  Orientation:  Full (Time, Place, and Person)  Thought Content: Paranoid Ideation and      Suicidal Thoughts:  No  Homicidal Thoughts:  No  Memory:  Immediate;   Good  Judgement:  Fair  Insight:  Lacking  Psychomotor Activity:  Normal  Concentration:  Concentration: Fair and Attention Span: Fair  Recall:  Poor  Fund of Knowledge: Good  Language: Good  Akathisia:  No  Handed:   Right  AIMS (if indicated): not done  Assets:  Social Support  ADL's:  Impaired  Cognition: Impaired,  Moderate  Sleep:  Fair   Screenings: Equities trader Office Visit from 09/06/2021 in [facility] Office Visit from 10/12/2020 in [facility] Video Visit from 07/10/2020 in [facility]  PHQ-2 Total Score 3 0 1  PHQ-9 Total Score 14 -- --      Flowsheet Row ED to Hosp-Admission (Discharged) from 07/12/2022 in [facility] Office Visit from 09/06/2021 in [facility] Office Visit from 10/12/2020 in [facility]  C-SSRS RISK CATEGORY No Risk Error: Q3, 4, or 5 should not be populated when Q2 is No No Risk        Assessment and Plan:  KAMYLAH MANZO is a 82 y.o. year old female with a history of schizophrenia, neurocognitive disorder, A fib, systolic HF, hypertension, type II diabetes, who presents for follow up appointment for below.    1. Schizophrenia, unspecified type (HCC) Acute stressors include:  Other stressors include:    History: dx with schizophrenia yearly in her life, [facility] in 10/2013. Per chart, ""She was petitioned by her family, her 23 year old aunt with whom she lives, for behaving strangely, talking to herself, throwing her medicines out, putting a spell on Dr. Elesa Massed, who is a psychiatrist. In the Emergency Room, the patient has been talking constantly, apparently delusional, paranoid and grandiose, telling me that the [facility] cured her."  Originally on Depakote 750 mg qhs, risperidone 2 mg BID, olanzapine 10 mg qhs, cogentin 1 mg BID She continues to demonstrate disorganized thought processes.  She has ongoing behavioral issues, and reportedly have hallucinations of dead people.  Although she may benefit from a higher dose of olanzapine, will not do  uptitration at this time continue to work on behavior modification given concern of her cardiac condition/risk of QTc prolongation.  The staff expressed understanding of this.  Will continue current dose of olanzapine to target schizophrenia, along with Depakote for mood dysregulation. The clinician has discussed the side effects associated with the medication prescribed during this encounter. Please refer to the notes from previous encounters for further details.   2. Neurocognitive disorder Instrumental Activities of Daily Living (IADLs):  Requires assistance with the following: managing finances, medications, driving Activities of Daily Living (ADLs):   independent in the following:  feeding, continence, walking (walker), grooming Requires assistance with the following: bathing,  toileting (occasionally have incontinence)    Folate, Vtamin B12, TSH wnl 4-07/2022   Head MRI in 2021: punctate acute/early subacute infarct within the right frontal lobe precentral gyrus, and mild generalized parenchymal atrophy and chronic small vessel ischemic disease.    Neuropsych assessment: Mini cog- "cat" 1/3, clock- 1/3 (draw circle, put numbers of 12,5,10,13..), orientation 4/5 (except date) on 10/2017   Etiology: vascular, r/o alzheimer     She started to have occasional behavior disturbances since the last visit.  Will continue current dose of Depakote at this time to mitigate possible side effects.  This medication can be up titrated in the future.  She had significant improvement in nighttime behavior, and behavior disturbances. We've opted not to initiate medication for dementia due to the risk of polypharmacy outweighing its potential benefits.  Plan Continue olanzapine 25 mg at night  EKG  HR 71, NSR with PAC, QTc 458 msec 02/2022 Continue Depakote ER 250 mg at night  Obtain ROI to get record from PCP for review Plan to obtain labs at the next visit. Last checked in 06/2022 Next appointment: 10/16 at 11  am, video Maria Pace. [phone]) - on vitamin b 12, vitamin D3  - she will have pcp visit in March 2024. Will consider checking vitamin b12, folate if those are not done.   (She is on Ambien 5 mg)    The patient demonstrates the following risk factors for suicide: Chronic risk factors for suicide include: psychiatric disorder of schizophrenia. Acute risk factors for suicide include: unemployment. Protective factors for this patient include: positive social support. Considering these factors, the overall suicide risk at this point appears to be low. Patient is appropriate for outpatient follow up.    Collaboration of Care: Collaboration of Care: Other reviewed notes in Epic  Patient/Guardian was advised Release of Information must be obtained prior to any record release in order to collaborate their care with an outside provider. Patient/Guardian was advised if they have not already done so to contact the registration department to sign all necessary forms in order for [place] to release information regarding their care.   Consent: Patient/Guardian gives verbal consent for treatment and assignment of benefits for services provided during this visit. Patient/Guardian expressed understanding and agreed to proceed.    [name], MD 11/03/2022, 11:41 AM

## 2022-11-02 ENCOUNTER — Encounter: Payer: Self-pay | Admitting: Internal Medicine

## 2022-11-02 ENCOUNTER — Ambulatory Visit (INDEPENDENT_AMBULATORY_CARE_PROVIDER_SITE_OTHER): Payer: Medicare (Managed Care) | Admitting: Internal Medicine

## 2022-11-02 ENCOUNTER — Other Ambulatory Visit (HOSPITAL_COMMUNITY)
Admission: RE | Admit: 2022-11-02 | Discharge: 2022-11-02 | Disposition: A | Discharge status: Home / Self Care | Payer: Medicare (Managed Care) | Source: Ambulatory Visit | Attending: Internal Medicine | Admitting: Internal Medicine

## 2022-11-02 VITALS — BP 120/70 | HR 70 | Ht 60.0 in | Wt 182.6 lb

## 2022-11-02 DIAGNOSIS — Z79899 Other long term (current) drug therapy: Secondary | ICD-10-CM | POA: Diagnosis present

## 2022-11-02 DIAGNOSIS — I471 Supraventricular tachycardia, unspecified: Secondary | ICD-10-CM

## 2022-11-02 LAB — TSH: TSH: 3.383 u[IU]/mL (ref 0.350–4.500)

## 2022-11-02 MED ORDER — AMIODARONE HCL 200 MG PO TABS: ORAL_TABLET | ORAL | 6 refills | Status: DC

## 2022-11-02 MED ORDER — ENTRESTO 49-51 MG PO TABS: 1.0000 | ORAL_TABLET | Freq: Two times a day (BID) | ORAL | 6 refills | Status: DC

## 2022-11-02 NOTE — Progress Notes (Signed)
Cardiology Office Note  Date: 11/02/2022   ID: Maria Pace, DOB [birth year], MRN [id number]  PCP:  Benetta Spar, MD  Cardiologist:  Marjo Bicker, MD Electrophysiologist:  None    History of Present Illness: Maria Pace is a 82 y.o. female known to have chronic systolic heart failure with LVEF 30 to 35% and RWMA, history of SVT, HTN, DM 2, HLD,  Lives in a facility, accompanied by caretaker.  Patient was initially referred to cardiology clinic in 2021 January for abnormal EKG. it was noted that she had atrial tachycardia and medical management was recommended.  She was lost to follow-up and referred back to cardiology clinic for management of chronic systolic heart failure with LVEF 30 to 35% with RWMA. Patient was admitted recently in 06/2022 with COVID-19 infection and had paroxysms of SVT.  I reviewed the cardiac monitoring strips from the hospitalization that showed SVT, atrial tachycardia and possibly atrial flutter (per the scanned report made this difficult to interpret). She underwent event monitor in 5/24 that showed 402 runs of SVT, fastest lasting 3 minutes 40 seconds and the longest lasting 28 minutes 45 seconds. Not all the strips are uploaded but the SVT runs appear to be atrial fibrillation. No patient triggered events were noted. There was also 2.2% PVC burden.  She is here for follow-up visit, accompanied by caretaker. She does not have any symptoms of angina, DOE, palpitations, fatigue, syncope, dizziness or leg swelling.  Patient has her sister listed as next of kin but I asked the caretaker to clarify this next time I see the patient.  Does not want any aggressive interventions at this time.    Past Medical History:  Diagnosis Date   Bipolar 1 disorder (HCC)    Essential hypertension    GERD (gastroesophageal reflux disease)    Hyperlipidemia    Irregular heart beat    Schizophrenia (HCC)    Type 2 diabetes mellitus (HCC)     Past Surgical  History:  Procedure Laterality Date   YAG LASER APPLICATION Left 01/06/2015   Procedure: YAG LASER APPLICATION;  Surgeon: Jethro Bolus, MD;  Location: [facility];  Service: Ophthalmology;  Laterality: Left;    Current Outpatient Medications  Medication Sig Dispense Refill   albuterol (VENTOLIN HFA) 108 (90 Base) MCG/ACT inhaler Inhale 1 puff into the lungs every 6 (six) hours as needed.     apixaban (ELIQUIS) 5 MG TABS tablet Take 1 tablet (5 mg total) by mouth 2 (two) times daily. 60 tablet    ascorbic acid (VITAMIN C) 500 MG tablet Take 500 mg by mouth daily.     atorvastatin (LIPITOR) 20 MG tablet Take 20 mg by mouth daily.     Camphor-Eucalyptus-Menthol (VICKS VAPORUB) 4.7-1.2-2.6 % OINT Apply 1 application topically daily. Apply once to toenails daily     cholecalciferol (VITAMIN D3) 10 MCG (400 UNIT) TABS tablet Take 1,000 Units by mouth daily.     divalproex (DEPAKOTE ER) 250 MG 24 hr tablet Take 1 tablet (250 mg total) by mouth daily. 30 tablet 1   docusate sodium (COLACE) 100 MG capsule Take 200 mg by mouth daily.     FEROSUL 325 (65 Fe) MG tablet Take 325 mg by mouth 2 (two) times daily with a meal.     gabapentin (NEURONTIN) 400 MG capsule Take 400 mg by mouth at bedtime.     latanoprost (XALATAN) 0.005 % ophthalmic solution Place 1 drop into both eyes at bedtime.  loratadine (CLARITIN) 10 MG tablet Take 10 mg by mouth daily.     metFORMIN (GLUCOPHAGE) 500 MG tablet Take 500 mg by mouth 2 (two) times daily with a meal.     metoprolol tartrate (LOPRESSOR) 25 MG tablet Take 1 tablet (25 mg total) by mouth 2 (two) times daily.     Multiple Vitamin (MULTIVITAMIN) tablet Take 1 tablet by mouth daily.     NYAMYC powder Apply 1 Application topically 3 (three) times daily.     OLANZapine (ZYPREXA) 20 MG tablet Take 1 tablet (20 mg total) by mouth at bedtime. Total of 25 mg at night. Take along with 20 mg tab 30 tablet 1   OLANZapine (ZYPREXA) 5 MG tablet Take 1 tablet (5 mg total) by mouth  at bedtime. Total of 25 mg at night. Take along with 20 mg tab 30 tablet 1   pantoprazole (PROTONIX) 40 MG tablet Take 40 mg by mouth daily.     sacubitril-valsartan (ENTRESTO) 24-26 MG Take 1 tablet by mouth 2 (two) times daily. 60 tablet 11   senna-docusate (SENOKOT-S) 8.6-50 MG tablet Take 2 tablets by mouth at bedtime. 60 tablet 0   spironolactone (ALDACTONE) 25 MG tablet Take 1 tablet (25 mg total) by mouth daily. 90 tablet 3   vitamin B-12 (CYANOCOBALAMIN) 500 MCG tablet Take 1 tablet (500 mcg total) by mouth daily.     zinc sulfate 220 (50 Zn) MG capsule Take 220 mg by mouth daily.     zolpidem (AMBIEN) 5 MG tablet Take 1 tablet (5 mg total) by mouth at bedtime as needed for sleep. 10 tablet 0   polyethylene glycol powder (GLYCOLAX/MIRALAX) powder Take 17 g by mouth daily as needed for mild constipation or moderate constipation. (Patient not taking: Reported on 07/27/2022)     No current facility-administered medications for this visit.   Allergies:  Haloperidol lactate   Social History: The patient  reports that she has never smoked. She has never used smokeless tobacco. She reports that she does not drink alcohol and does not use drugs.   Family History: The patient's family history includes Diabetes type II in her father; Hypertension in her father.   ROS:  Please see the history of present illness. Otherwise, complete review of systems is positive for none.  All other systems are reviewed and negative.   Physical Exam: VS:  BP 120/70   Pulse 70   Ht 5' (1.524 m)   Wt 182 lb 9.6 oz (82.8 kg)   SpO2 99%   BMI 35.66 kg/m , BMI Body mass index is 35.66 kg/m.  Wt Readings from Last 3 Encounters:  11/02/22 182 lb 9.6 oz (82.8 kg)  07/27/22 181 lb 3.2 oz (82.2 kg)  07/12/22 200 lb (90.7 kg)    General: Patient appears comfortable at rest. HEENT: Conjunctiva and lids normal, oropharynx clear with moist mucosa. Neck: Supple, no elevated JVP or carotid bruits, no  thyromegaly. Lungs: Clear to auscultation, nonlabored breathing at rest. Cardiac: Regular rate and rhythm, no S3 or significant systolic murmur, no pericardial rub. Abdomen: Soft, nontender, no hepatomegaly, bowel sounds present, no guarding or rebound. Extremities: No pitting edema, distal pulses 2+. Skin: Warm and dry. Musculoskeletal: No kyphosis. Neuropsychiatric: Alert and oriented x3, affect grossly appropriate.  Recent Labwork: 07/12/2022: B Natriuretic Peptide 897.0 07/13/2022: TSH 0.447 07/14/2022: BUN 18; Creatinine, Ser 0.94; Magnesium 2.0; Potassium 4.0; Sodium 135 08/11/2022: ALT 14; AST 17; Hemoglobin 10.2; Platelets 278     Component Value Date/Time  CHOL 168 10/10/2019 0701   CHOL 204 (H) 10/30/2013 0501   TRIG 54 10/10/2019 0701   TRIG 240 (H) 10/30/2013 0501   HDL 37 (L) 10/10/2019 0701   HDL 38 (L) 10/30/2013 0501   CHOLHDL 4.5 10/10/2019 0701   VLDL 11 10/10/2019 0701   VLDL 48 (H) 10/30/2013 0501   LDLCALC 120 (H) 10/10/2019 0701   LDLCALC 118 (H) 10/30/2013 0501    Other Studies Reviewed Today: Echocardiogram in 07/13/2022 LVEF 30 to 35% with RWMA Indeterminate LV diastolic parameters RV systolic function is normal LA mildly dilated Mild to moderate MR  Assessment and Plan: Patient is a 82 year old F known to have chronic systolic heart failure with LVEF 30 to 35% and RWMA, paroxysmal A-fib, HTN, DM 2, HLD, history of SVT is here for follow-up visit.   # Paroxysmal A-fib -Event monitor from 03/2019 showed atrial tachycardia for which medical management was recommended. I reviewed the cardiac monitoring strips from the hospitalization in April 2024 that showed SVT and not atrial fibrillation. There was a possibility of atrial flutter but the baseline artifact made it difficult to interpret. However event monitor from 5/24 showed 408 SVT runs, some of them are atrial fibrillation. I will continue metoprolol tartrate 25 mg twice daily and Eliquis 5 mg twice  daily.  No risk of falls.  EKG today showed NSR however due to intermittent paroxysmal A-fib with RVR, will start amiodarone 200 mg twice daily for 3 weeks followed by amiodarone 200 mg once daily.  Obtain baseline TSH and TSH in 3 months. -Will obtain 2-week event monitor in 6 months to quantify the burden of SVT/atrial fibrillation.  # Chronic systolic heart failure LVEF 30-35% with RWMA -Patient has no symptoms of DOE or angina.  She does not do much at home, walks only from bedroom to the living room. Patient does not prefer any procedures at this time.  Will defer ischemia evaluation unless she presents with ACS.  Her sister is listed as POA (according to the caretaker) but I do not have documentation of the same. -Continue GDMT with metoprolol tartrate 25 mg twice daily, increase Entresto from 24-26 mg to 49-51 mg twice daily, continue spironolactone 25 mg once daily.  Obtain BMP in 5 days.  Echocardiogram will be repeated in the next clinic visit (after event monitor is resulted).  # HTN: Continue GDMT as stated above # HLD: Continue atorvastatin 20 mg nightly   I have spent a total of 30 minutes with patient reviewing chart, EKGs, labs and examining patient as well as establishing an assessment and plan that was discussed with the patient.  > 50% of time was spent in direct patient care.    Medication Adjustments/Labs and Tests Ordered: Current medicines are reviewed at length with the patient today.  Concerns regarding medicines are outlined above.   Tests Ordered: Orders Placed This Encounter  Procedures   TSH   Basic metabolic panel   TSH   EKG 12-Lead     Medication Changes: Meds ordered this encounter  Medications   sacubitril-valsartan (ENTRESTO) 49-51 MG    Sig: Take 1 tablet by mouth 2 (two) times daily.    Dispense:  60 tablet    Refill:  6    11/02/2022 dose increase   amiodarone (PACERONE) 200 MG tablet    Sig: Take 1 tablet (200 mg total) by mouth 2 (two) times  daily for 21 days, THEN 1 tablet (200 mg total) daily.    Dispense:  60 tablet    Refill:  6    11/02/2022 NEW      Disposition:  Follow up  8 months  Signed,  Verne Spurr, MD, 11/02/2022 11:44 AM     [facility] [address], [place], [place] [address]

## 2022-11-02 NOTE — Patient Instructions (Addendum)
Medication Instructions:  Your physician has recommended you make the following change in your medication:  Increase entresto to 49/51 mg twice daily Start amiodarone 200 mg twice daily for 3 weeks, then reduce to 200 mg daily Continue other medications as prescribed  Labwork: TSH today [facility] TSH in 3 months [facility] BMET in 5 days (11/07/2022) [facility] Non-fasting lab work  Testing/Procedures: Your physician has recommended that you wear a Zio XT monitor in 6 months around 05/05/2023 This monitor will be mailed to your home address   This monitor is a medical device that records the heart's electrical activity. Doctors most often use these monitors to diagnose arrhythmias. Arrhythmias are problems with the speed or rhythm of the heartbeat. The monitor is a small device applied to your chest. You can wear one while you do your normal daily activities. While wearing this monitor if you have any symptoms to push the button and record what you felt. Once you have worn this monitor for the period of time provider prescribed (for 14 days), you will return the monitor device in the postage paid box. Once it is returned they will download the data collected and provide [place] with a report which the provider will then review and we will call you with those results. Important tips:  Avoid showering during the first 24 hours of wearing the monitor. Avoid excessive sweating to help maximize wear time. Do not submerge the device, no hot tubs, and no swimming pools. Keep any lotions or oils away from the patch. After 24 hours you may shower with the patch on. Take brief showers with your back facing the shower head.  Do not remove patch once it has been placed because that will interrupt data and decrease adhesive wear time. Push the button when you have any symptoms and write down what you were feeling. Once you have completed wearing your monitor, remove and place into box which has  postage paid and place in your outgoing mailbox.  If for some reason you have misplaced your box then call our office and we can provide another box and/or mail it off for you.  Follow-Up: Your physician recommends that you schedule a follow-up appointment in: 8 months  Any Other Special Instructions Will Be Listed Below (If Applicable).  If you need a refill on your cardiac medications before your next appointment, please call your pharmacy.

## 2022-11-03 ENCOUNTER — Telehealth (INDEPENDENT_AMBULATORY_CARE_PROVIDER_SITE_OTHER): Payer: Medicare (Managed Care) | Admitting: Psychiatry

## 2022-11-03 ENCOUNTER — Encounter: Payer: Self-pay | Admitting: Psychiatry

## 2022-11-03 DIAGNOSIS — R419 Unspecified symptoms and signs involving cognitive functions and awareness: Secondary | ICD-10-CM | POA: Diagnosis not present

## 2022-11-03 DIAGNOSIS — F209 Schizophrenia, unspecified: Secondary | ICD-10-CM | POA: Diagnosis not present

## 2022-11-03 MED ORDER — DIVALPROEX SODIUM ER 250 MG PO TB24: 250.0000 mg | ORAL_TABLET | Freq: Every day | ORAL | 2 refills | Status: DC

## 2022-11-03 MED ORDER — OLANZAPINE 5 MG PO TABS: 5.0000 mg | ORAL_TABLET | Freq: Every day | ORAL | 3 refills | Status: DC

## 2022-11-03 MED ORDER — OLANZAPINE 20 MG PO TABS: 20.0000 mg | ORAL_TABLET | Freq: Every day | ORAL | 3 refills | Status: DC

## 2022-11-07 ENCOUNTER — Telehealth (HOSPITAL_COMMUNITY): Payer: Self-pay

## 2022-11-07 ENCOUNTER — Other Ambulatory Visit: Payer: Self-pay | Admitting: Psychiatry

## 2022-11-07 NOTE — Telephone Encounter (Signed)
Decline- I have never prescribed that medication to her.

## 2022-11-07 NOTE — Telephone Encounter (Signed)
the pharmacy called and stated that they sent a request and has not heard back.  pt needs refill on the gabapentin. pt last seen on 8-8 next appt 10-16

## 2022-11-08 NOTE — Telephone Encounter (Signed)
I would not order that medication as I would advise against it considering her current condition.

## 2022-11-08 NOTE — Telephone Encounter (Signed)
when i called back to notify they state that the patient needs a refill on the ambien 5mg  that Dr. Vanetta Shawl is suppose to be taking over that medication

## 2022-11-08 NOTE — Telephone Encounter (Signed)
left message notifiying pt of dr. Vanetta Shawl will not prescribed and that they need to call pcp

## 2022-11-09 ENCOUNTER — Other Ambulatory Visit (HOSPITAL_COMMUNITY)
Admission: RE | Admit: 2022-11-09 | Discharge: 2022-11-09 | Disposition: A | Discharge status: Home / Self Care | Payer: Medicare (Managed Care) | Source: Ambulatory Visit | Attending: Internal Medicine | Admitting: Internal Medicine

## 2022-11-09 DIAGNOSIS — I471 Supraventricular tachycardia, unspecified: Secondary | ICD-10-CM | POA: Insufficient documentation

## 2022-11-09 DIAGNOSIS — Z79899 Other long term (current) drug therapy: Secondary | ICD-10-CM | POA: Insufficient documentation

## 2022-11-09 LAB — BASIC METABOLIC PANEL
Anion gap: 11 (ref 5–15)
BUN: 21 mg/dL (ref 8–23)
CO2: 21 mmol/L — ABNORMAL LOW (ref 22–32)
Calcium: 8.6 mg/dL — ABNORMAL LOW (ref 8.9–10.3)
Chloride: 96 mmol/L — ABNORMAL LOW (ref 98–111)
Creatinine, Ser: 1.41 mg/dL — ABNORMAL HIGH (ref 0.44–1.00)
GFR, Estimated: 38 mL/min — ABNORMAL LOW (ref >60)
Glucose, Bld: 108 mg/dL — ABNORMAL HIGH (ref 70–99)
Potassium: 4.6 mmol/L (ref 3.5–5.1)
Sodium: 128 mmol/L — ABNORMAL LOW (ref 135–145)

## 2022-11-09 LAB — TSH: TSH: 5.83 u[IU]/mL — ABNORMAL HIGH (ref 0.350–4.500)

## 2022-11-10 ENCOUNTER — Telehealth: Payer: Self-pay

## 2022-11-10 DIAGNOSIS — R7989 Other specified abnormal findings of blood chemistry: Secondary | ICD-10-CM

## 2022-11-10 MED ORDER — SACUBITRIL-VALSARTAN 24-26 MG PO TABS: 1.0000 | ORAL_TABLET | Freq: Two times a day (BID) | ORAL | 11 refills | Status: DC

## 2022-11-10 NOTE — Telephone Encounter (Signed)
Spoke to [place] at [facility] who verbalized understanding.

## 2022-11-10 NOTE — Telephone Encounter (Signed)
-----   Message from Vishnu P Mallipeddi sent at 11/10/2022  9:00 AM EDT ----- TSH is mildly elevated, obtain free T3 and T4.  On BMP, serum creatinine is mildly elevated probably due to dose increase of Entresto.  Will cut back on the Entresto dose to 24-26 mg twice daily.  Repeat BMP in 5 days.
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
## 2023-01-10 NOTE — Addendum Note (Signed)
Addended by: Kerney Elbe on: 01/10/2023 10:45 AM   Modules accepted: Orders

## 2023-01-11 ENCOUNTER — Telehealth (INDEPENDENT_AMBULATORY_CARE_PROVIDER_SITE_OTHER): Payer: Medicare (Managed Care) | Admitting: Psychiatry

## 2023-01-11 ENCOUNTER — Encounter: Payer: Self-pay | Admitting: Psychiatry

## 2023-01-11 DIAGNOSIS — F209 Schizophrenia, unspecified: Secondary | ICD-10-CM | POA: Diagnosis not present

## 2023-01-11 DIAGNOSIS — R419 Unspecified symptoms and signs involving cognitive functions and awareness: Secondary | ICD-10-CM

## 2023-01-11 DIAGNOSIS — Z79899 Other long term (current) drug therapy: Secondary | ICD-10-CM | POA: Diagnosis not present

## 2023-01-11 LAB — BASIC METABOLIC PANEL
BUN/Creatinine Ratio: 16 (ref 12–28)
BUN: 20 mg/dL (ref 8–27)
CO2: 19 mmol/L — ABNORMAL LOW (ref 20–29)
Calcium: 9.1 mg/dL (ref 8.7–10.3)
Chloride: 97 mmol/L (ref 96–106)
Creatinine, Ser: 1.27 mg/dL — ABNORMAL HIGH (ref 0.57–1.00)
Glucose: 61 mg/dL — ABNORMAL LOW (ref 70–99)
Potassium: 5.5 mmol/L — ABNORMAL HIGH (ref 3.5–5.2)
Sodium: 133 mmol/L — ABNORMAL LOW (ref 134–144)
eGFR: 43 mL/min/{1.73_m2} — ABNORMAL LOW (ref >59)

## 2023-01-11 LAB — T3, FREE: T3, Free: 1.5 pg/mL — ABNORMAL LOW (ref 2.0–4.4)

## 2023-01-11 LAB — T4: T4, Total: 9.5 ug/dL (ref 4.5–12.0)

## 2023-01-11 NOTE — Patient Instructions (Addendum)
Continue olanzapine 25 mg at night   Continue Depakote ER 250 mg at night  Obtain labs- LFT, VPA Next appointment: 1/8 at 10 30

## 2023-01-13 ENCOUNTER — Telehealth: Payer: Self-pay | Admitting: Internal Medicine

## 2023-01-13 DIAGNOSIS — Z79899 Other long term (current) drug therapy: Secondary | ICD-10-CM

## 2023-01-13 LAB — HEPATIC FUNCTION PANEL
ALT: 17 [IU]/L (ref 0–32)
AST: 19 [IU]/L (ref 0–40)
Albumin: 4.1 g/dL (ref 3.8–4.8)
Alkaline Phosphatase: 86 [IU]/L (ref 44–121)
Bilirubin Total: <0.2 mg/dL (ref 0.0–1.2)
Bilirubin, Direct: <0.1 mg/dL (ref 0.00–0.40)
Total Protein: 6.6 g/dL (ref 6.0–8.5)

## 2023-01-13 LAB — VALPROIC ACID LEVEL: Valproic Acid Lvl: 25 ug/mL — ABNORMAL LOW (ref 50–100)

## 2023-01-13 NOTE — Progress Notes (Signed)
Please advise the caregiver that although the Depakote level is in the low range, I recommend she continues with the current dose as it has been effective. Her liver function is within the normal range.

## 2023-01-13 NOTE — Progress Notes (Signed)
Called to inform of lab results spoke to caregiver she voiced understanding

## 2023-01-13 NOTE — Telephone Encounter (Signed)
Pt's caretaker notified and verbalized understanding.

## 2023-01-13 NOTE — Telephone Encounter (Signed)
Pt returning cal regarding results. Please advise

## 2023-01-13 NOTE — Telephone Encounter (Signed)
  Corrie Dandy called back to provide fax# [phone]. She said, to send lab result to this fax number

## 2023-01-13 NOTE — Telephone Encounter (Signed)
Lab results faxed to care facility

## 2023-01-13 NOTE — Telephone Encounter (Signed)
-----   Message from Vishnu P Mallipeddi sent at 01/13/2023 12:48 PM EDT ----- Serum K 5.5 Mildly elevated. Repeat BMP in one week. Not on K supplements. Free T3 mildly low and TSH mildly elevated. Follow up with PCP for thyroid labs management. Repeat TSH, free T3 and free T4 in 3 months prior to the next clinic visit.

## 2023-01-18 ENCOUNTER — Other Ambulatory Visit: Payer: Self-pay

## 2023-01-18 DIAGNOSIS — I471 Supraventricular tachycardia, unspecified: Secondary | ICD-10-CM

## 2023-01-18 DIAGNOSIS — Z79899 Other long term (current) drug therapy: Secondary | ICD-10-CM

## 2023-01-19 LAB — BASIC METABOLIC PANEL
BUN/Creatinine Ratio: 19 (ref 12–28)
BUN: 24 mg/dL (ref 8–27)
CO2: 20 mmol/L (ref 20–29)
Calcium: 8.8 mg/dL (ref 8.7–10.3)
Chloride: 97 mmol/L (ref 96–106)
Creatinine, Ser: 1.28 mg/dL — ABNORMAL HIGH (ref 0.57–1.00)
Glucose: 80 mg/dL (ref 70–99)
Potassium: 5.3 mmol/L — ABNORMAL HIGH (ref 3.5–5.2)
Sodium: 134 mmol/L (ref 134–144)
eGFR: 42 mL/min/{1.73_m2} — ABNORMAL LOW (ref >59)

## 2023-01-20 ENCOUNTER — Telehealth: Payer: Self-pay

## 2023-01-20 DIAGNOSIS — R7989 Other specified abnormal findings of blood chemistry: Secondary | ICD-10-CM

## 2023-01-20 DIAGNOSIS — Z79899 Other long term (current) drug therapy: Secondary | ICD-10-CM

## 2023-01-20 MED ORDER — SPIRONOLACTONE 25 MG PO TABS: 12.5000 mg | ORAL_TABLET | Freq: Every day | ORAL | 3 refills | Status: DC

## 2023-01-20 NOTE — Telephone Encounter (Signed)
-----   Message from [facility] sent at 01/20/2023 12:20 PM EDT -----  ----- Message ----- From: Marjo Bicker, MD Sent: 01/20/2023  12:13 PM EDT To: Roseanne Reno, CMA  Cut back on the dose of spironolactone from 25 mg to 12.5 mg once daily and repeat BMP in 5 days.

## 2023-01-20 NOTE — Telephone Encounter (Signed)
Spoke with Manon Hilding office administrator and Care taker Benjamine Mola. Advised of changes to medication faxed BMET to be completed in a week to [phone]. Verbalized understanding.

## 2023-01-26 LAB — BASIC METABOLIC PANEL
BUN/Creatinine Ratio: 16 (ref 12–28)
BUN: 23 mg/dL (ref 8–27)
CO2: 19 mmol/L — ABNORMAL LOW (ref 20–29)
Calcium: 9.1 mg/dL (ref 8.7–10.3)
Chloride: 99 mmol/L (ref 96–106)
Creatinine, Ser: 1.46 mg/dL — ABNORMAL HIGH (ref 0.57–1.00)
Glucose: 96 mg/dL (ref 70–99)
Potassium: 4.8 mmol/L (ref 3.5–5.2)
Sodium: 136 mmol/L (ref 134–144)
eGFR: 36 mL/min/{1.73_m2} — ABNORMAL LOW (ref >59)

## 2023-02-27 ENCOUNTER — Other Ambulatory Visit: Payer: Self-pay | Admitting: Psychiatry

## 2023-04-01 NOTE — Progress Notes (Signed)
 Virtual Visit via Video Note  I connected with Naomie DELENA Hurst on 04/05/23 at 10:30 AM EST by a video enabled telemedicine application and verified that I am speaking with the correct person using two identifiers.  Location: Patient: home Provider: office Persons participated in the visit- patient, provider    I discussed the limitations of evaluation and management by telemedicine and the availability of in person appointments. The patient expressed understanding and agreed to proceed.    I discussed the assessment and treatment plan with the patient. The patient was provided an opportunity to ask questions and all were answered. The patient agreed with the plan and demonstrated an understanding of the instructions.   The patient was advised to call back or seek an in-person evaluation if the symptoms worsen or if the condition fails to improve as anticipated.  I provided 25 minutes of non-face-to-face time during this encounter.   Katheren Sleet, MD    [facility] MD/PA/NP OP Progress Note  04/05/2023 11:00 AM STATIA BURDICK  MRN:  [id number]  Chief Complaint:  Chief Complaint  Patient presents with   Follow-up   HPI:  This is a follow-up appointment for schizophrenia, neurocognitive disorder.  She states that she has been doing fine.  She has been doing crochet.  She has made a dress and hat.  She thinks the people at group home has been treating her okay.  Her roommate is talking through the night, which affects her sleep at night.  She then talks about the shoes.  She states that she has diabetes and wants to have a pair of shoes.  She has good appetite.  She denies feeling depressed or anxiety.  She denies hallucinations.  She denies SI, HI.   Reena, the staff presents at the visit.  There has been no change since the last visit.  Although she has been a picky eater, she denies concern about this. (During this conversation, Marnee states that they don't want her to be skinny) There  has been a teacher, adult education, although the structure has not changed otherwise.  Although Zamiah sometimes make a comments such as the staff doe not like her, and she has made up her mind on certain things, she is usually redirectable afterward.  No aggression or no other concern.  She agrees with the plan as outlined below.   Visit Diagnosis:    ICD-10-CM   1. Schizophrenia, unspecified type (HCC)  F20.9     2. Neurocognitive disorder  R41.9     3. High risk medication use  Z79.899 Valproic  acid level    Comprehensive metabolic panel    CBC      Past Psychiatric History: Please see initial evaluation for full details. I have reviewed the history. No updates at this time.     Past Medical History:  Past Medical History:  Diagnosis Date   Bipolar 1 disorder (HCC)    Essential hypertension    GERD (gastroesophageal reflux disease)    Hyperlipidemia    Irregular heart beat    Schizophrenia (HCC)    Type 2 diabetes mellitus (HCC)     Past Surgical History:  Procedure Laterality Date   YAG LASER APPLICATION Left 01/06/2015   Procedure: YAG LASER APPLICATION;  Surgeon: Oneil Platts, MD;  Location: [facility];  Service: Ophthalmology;  Laterality: Left;    Family Psychiatric History: Please see initial evaluation for full details. I have reviewed the history. No updates at this time.     Family  History:  Family History  Problem Relation Age of Onset   Hypertension Father    Diabetes type II Father     Social History:  Social History   Socioeconomic History   Marital status: Single    Spouse name: Not on file   Number of children: Not on file   Years of education: Not on file   Highest education level: Not on file  Occupational History   Not on file  Tobacco Use   Smoking status: Never   Smokeless tobacco: Never  Vaping Use   Vaping status: Never Used  Substance and Sexual Activity   Alcohol  use: No   Drug use: No   Sexual activity: Never  Other Topics Concern    Not on file  Social History Narrative   Not on file   Social Drivers of Health   Financial Resource Strain: Not on file  Food Insecurity: No Food Insecurity (07/12/2022)   Hunger Vital Sign    Worried About Running Out of Food in the Last Year: Never true    Ran Out of Food in the Last Year: Never true  Transportation Needs: No Transportation Needs (07/12/2022)   PRAPARE - Administrator, Civil Service (Medical): No    Lack of Transportation (Non-Medical): No  Physical Activity: Not on file  Stress: Not on file  Social Connections: Not on file    Allergies:  Allergies  Allergen Reactions   Haloperidol Lactate Other (See Comments)    Couldn't urinate anymore    Metabolic Disorder Labs: Lab Results  Component Value Date   HGBA1C 6.2 (H) 07/12/2022   MPG 131.24 07/12/2022   MPG 134.11 10/03/2019   No results found for: PROLACTIN Lab Results  Component Value Date   CHOL 168 10/10/2019   TRIG 54 10/10/2019   HDL 37 (L) 10/10/2019   CHOLHDL 4.5 10/10/2019   VLDL 11 10/10/2019   LDLCALC 120 (H) 10/10/2019   LDLCALC 99 10/06/2019   Lab Results  Component Value Date   TSH 5.830 (H) 11/09/2022   TSH 3.383 11/02/2022    Therapeutic Level Labs: No results found for: LITHIUM  Lab Results  Component Value Date   VALPROATE 25 (L) 01/12/2023   VALPROATE 20 (L) 08/11/2022   No results found for: CBMZ  Current Medications: Current Outpatient Medications  Medication Sig Dispense Refill   albuterol  (VENTOLIN  HFA) 108 (90 Base) MCG/ACT inhaler Inhale 1 puff into the lungs every 6 (six) hours as needed.     amiodarone  (PACERONE ) 200 MG tablet Take 1 tablet (200 mg total) by mouth 2 (two) times daily for 21 days, THEN 1 tablet (200 mg total) daily. 60 tablet 6   apixaban  (ELIQUIS ) 5 MG TABS tablet Take 1 tablet (5 mg total) by mouth 2 (two) times daily. 60 tablet    ascorbic acid  (VITAMIN C ) 500 MG tablet Take 500 mg by mouth daily.     atorvastatin  (LIPITOR)  20 MG tablet Take 20 mg by mouth daily.     Camphor-Eucalyptus-Menthol (VICKS VAPORUB) 4.7-1.2-2.6 % OINT Apply 1 application topically daily. Apply once to toenails daily     cholecalciferol  (VITAMIN D3) 10 MCG (400 UNIT) TABS tablet Take 1,000 Units by mouth daily.     divalproex  (DEPAKOTE  ER) 250 MG 24 hr tablet Take 1 tablet (250 mg total) by mouth daily. 30 tablet 3   docusate sodium  (COLACE) 100 MG capsule Take 200 mg by mouth daily.     FEROSUL 325 (65 Fe)  MG tablet Take 325 mg by mouth 2 (two) times daily with a meal.     gabapentin  (NEURONTIN ) 400 MG capsule Take 400 mg by mouth at bedtime.     latanoprost  (XALATAN ) 0.005 % ophthalmic solution Place 1 drop into both eyes at bedtime.     loratadine  (CLARITIN ) 10 MG tablet Take 10 mg by mouth daily.     metFORMIN (GLUCOPHAGE) 500 MG tablet Take 500 mg by mouth 2 (two) times daily with a meal.     metoprolol  tartrate (LOPRESSOR ) 25 MG tablet Take 1 tablet (25 mg total) by mouth 2 (two) times daily.     Multiple Vitamin (MULTIVITAMIN) tablet Take 1 tablet by mouth daily.     NYAMYC  powder Apply 1 Application topically 3 (three) times daily.     OLANZapine  (ZYPREXA ) 20 MG tablet Take 1 tablet (20 mg total) by mouth at bedtime. Total of 25 mg at night. Take along with 20 mg tab 30 tablet 3   OLANZapine  (ZYPREXA ) 5 MG tablet Take 1 tablet (5 mg total) by mouth at bedtime. Total of 25 mg at night. Take along with 20 mg tab 30 tablet 3   pantoprazole  (PROTONIX ) 40 MG tablet Take 40 mg by mouth daily.     polyethylene glycol powder (GLYCOLAX /MIRALAX ) powder Take 17 g by mouth daily as needed for mild constipation or moderate constipation. (Patient not taking: Reported on 07/27/2022)     sacubitril -valsartan  (ENTRESTO ) 24-26 MG Take 1 tablet by mouth 2 (two) times daily. 60 tablet 11   senna-docusate (SENOKOT-S) 8.6-50 MG tablet Take 2 tablets by mouth at bedtime. 60 tablet 0   spironolactone  (ALDACTONE ) 25 MG tablet Take 0.5 tablets (12.5 mg total)  by mouth daily. 90 tablet 3   vitamin B-12 (CYANOCOBALAMIN ) 500 MCG tablet Take 1 tablet (500 mcg total) by mouth daily.     zinc  sulfate 220 (50 Zn) MG capsule Take 220 mg by mouth daily.     No current facility-administered medications for this visit.     Musculoskeletal: Strength & Muscle Tone:  N/A Gait & Station:  N/A Patient leans: N/A  Psychiatric Specialty Exam: Review of Systems  Psychiatric/Behavioral:  Positive for hallucinations. Negative for agitation, behavioral problems, confusion, decreased concentration, dysphoric mood, self-injury, sleep disturbance and suicidal ideas. The patient is not nervous/anxious and is not hyperactive.   All other systems reviewed and are negative.   There were no vitals taken for this visit.There is no height or weight on file to calculate BMI.  General Appearance: Well Groomed  Eye Contact:  Good  Speech:  Clear and Coherent  Volume:  Normal  Mood:   fine  Affect:  Appropriate, Congruent, and Full Range  Thought Process:  Coherent and Disorganized  Orientation:  Full (Time, Place, and Person)  Thought Content: Logical   Suicidal Thoughts:  No  Homicidal Thoughts:  No  Memory:  Immediate;   Good  Judgement:  Good  Insight:  Present  Psychomotor Activity:  Normal  Concentration:  Concentration: Good and Attention Span: Good  Recall:  Fair  Fund of Knowledge: Good  Language: Good  Akathisia:  No  Handed:  Right  AIMS (if indicated): not done  Assets:  Social Support  ADL's:  Intact  Cognition: WNL  Sleep:  Fair   Screenings: PHQ2-9    Flowsheet Row Office Visit from 09/06/2021 in [facility] Office Visit from 10/12/2020 in [facility] Video Visit from 07/10/2020 in  [facility]  PHQ-2 Total Score 3 0 1  PHQ-9 Total Score 14 -- --      Flowsheet Row ED to Hosp-Admission (Discharged) from 07/12/2022 in  [facility] Office Visit from 09/06/2021 in [facility] Office Visit from 10/12/2020 in [facility]  C-SSRS RISK CATEGORY No Risk Error: Q3, 4, or 5 should not be populated when Q2 is No No Risk        Assessment and Plan:  SATOYA FEELEY is a 83 y.o. year old female with a history of schizophrenia, neurocognitive disorder, A fib, systolic HF, hypertension, type II diabetes, who presents for follow up appointment for below.   1. Schizophrenia, unspecified type (HCC) Acute stressors include:  Other stressors include:    History: dx with schizophrenia yearly in her life, [facility] in 10/2013. Per chart, She was petitioned by her family, her 24 year old aunt with whom she lives, for behaving strangely, talking to herself, throwing her medicines out, putting a spell on Dr. Neomi, who is a psychiatrist. In the Emergency Room, the patient has been talking constantly, apparently delusional, paranoid and grandiose, telling me that the [facility] cured her.  Originally on Depakote  750 mg qhs, risperidone  2 mg BID, olanzapine  10 mg qhs, cogentin  1 mg BID Although she demonstrates occasional disorganized thought process and has hallucinations, she is calm during the visit, and the caregiver denies any significant behavior issues since the last visit.  Will continue current dose of olanzapine  to target schizophrenia, along with Depakote  for mood dysregulation.   2. Neurocognitive disorder Instrumental Activities of Daily Living (IADLs):  Requires assistance with the following: managing finances, medications, driving Activities of Daily Living (ADLs):   independent in the following:  feeding, continence, walking (walker), grooming Requires assistance with the following: bathing,  toileting (occasionally have incontinence)    Folate, Vtamin B12, TSH wnl 4-07/2022   Head MRI in 2021: punctate acute/early subacute  infarct within the right frontal lobe precentral gyrus, and mild generalized parenchymal atrophy and chronic small vessel ischemic disease.    Neuropsych assessment: Mini cog- cat 1/3, clock- 1/3 (draw circle, put numbers of 12,5,10,13..), orientation 4/5 (except date) on 10/2017   Etiology: vascular, r/o alzheimer     Stable.  Will continue Depakote  for behavioral disturbances at night given it has been effective.  Previously discussed regarding the risk of olanzapine  for people with neurocognitive disorder.  Please see more details in the previous note. we've opted not to initiate medication for dementia due to the risk of polypharmacy outweighing its potential benefits.  3. High risk medication use Obtain labs for monitoring as she is on Depakote .     Plan Continue olanzapine  25 mg at night  EKG  HR 71, NSR with PAC, QTc 458 msec 10/2022 Continue Depakote  ER 250 mg at night  VPA 25, LFT wnl in 12/2022,  Obtain labs- CBC, CMP, VPA Next appointment: 4/10 at 11 am, IP Gennie. [phone]) - on vitamin b 12, vitamin D3  - she will have pcp visit in March 2024. Will consider checking vitamin b12, folate if those are not done.   (She is on Ambien  5 mg)    The patient demonstrates the following risk factors for suicide: Chronic risk factors for suicide include: psychiatric disorder of schizophrenia. Acute risk factors for suicide include: unemployment. Protective factors for this patient include: positive social support. Considering these factors, the overall suicide  risk at this point appears to be low. Patient is appropriate for outpatient follow up.      Collaboration of Care: Collaboration of Care: Other reviewed notes in Epic  Patient/Guardian was advised Release of Information must be obtained prior to any record release in order to collaborate their care with an outside provider. Patient/Guardian was advised if they have not already done so to contact the registration department to sign  all necessary forms in order for us  to release information regarding their care.   Consent: Patient/Guardian gives verbal consent for treatment and assignment of benefits for services provided during this visit. Patient/Guardian expressed understanding and agreed to proceed.    Katheren Sleet, MD 04/05/2023, 11:00 AM

## 2023-04-05 ENCOUNTER — Encounter: Payer: Self-pay | Admitting: Psychiatry

## 2023-04-05 ENCOUNTER — Telehealth: Payer: 59 | Admitting: Psychiatry

## 2023-04-05 DIAGNOSIS — Z79899 Other long term (current) drug therapy: Secondary | ICD-10-CM | POA: Diagnosis not present

## 2023-04-05 DIAGNOSIS — R419 Unspecified symptoms and signs involving cognitive functions and awareness: Secondary | ICD-10-CM | POA: Diagnosis not present

## 2023-04-05 DIAGNOSIS — F209 Schizophrenia, unspecified: Secondary | ICD-10-CM | POA: Diagnosis not present

## 2023-04-05 MED ORDER — OLANZAPINE 5 MG PO TABS: 5.0000 mg | ORAL_TABLET | Freq: Every day | ORAL | 3 refills | Status: AC

## 2023-04-05 MED ORDER — OLANZAPINE 20 MG PO TABS: 20.0000 mg | ORAL_TABLET | Freq: Every day | ORAL | 3 refills | Status: AC

## 2023-04-05 NOTE — Patient Instructions (Signed)
 Continue olanzapine 25 mg at night   Continue Depakote ER 250 mg at night   Obtain labs- CBC, CMP, VPA Next appointment: 4/10 at 11 am

## 2023-05-01 ENCOUNTER — Other Ambulatory Visit: Payer: Self-pay

## 2023-05-01 ENCOUNTER — Other Ambulatory Visit: Payer: Medicaid Other

## 2023-05-01 DIAGNOSIS — I4891 Unspecified atrial fibrillation: Secondary | ICD-10-CM

## 2023-05-04 ENCOUNTER — Ambulatory Visit (INDEPENDENT_AMBULATORY_CARE_PROVIDER_SITE_OTHER): Payer: 59 | Admitting: Podiatry

## 2023-05-04 ENCOUNTER — Encounter: Payer: Self-pay | Admitting: Podiatry

## 2023-05-04 DIAGNOSIS — M79675 Pain in left toe(s): Secondary | ICD-10-CM | POA: Diagnosis not present

## 2023-05-04 DIAGNOSIS — M79674 Pain in right toe(s): Secondary | ICD-10-CM | POA: Diagnosis not present

## 2023-05-04 DIAGNOSIS — B351 Tinea unguium: Secondary | ICD-10-CM | POA: Diagnosis not present

## 2023-05-04 NOTE — Progress Notes (Signed)
  Subjective:  Patient ID: Maria Pace, female    DOB: Jan 02, 1941,  MRN: [id number]  Chief Complaint  Patient presents with   Nail Problem    Patient is here for nail trim and Callouses cut     83 y.o. female presents with the above complaint. History confirmed with patient.   Objective:  Physical Exam: warm, good capillary refill, no trophic changes or ulcerative lesions, normal DP and PT pulses, normal sensory exam, and no calluses are noted on exam. Left Foot: dystrophic yellowed discolored nail plates with subungual debris Right Foot: dystrophic yellowed discolored nail plates with subungual debris  Assessment:   1. Pain due to onychomycosis of toenails of both feet      Plan:  Patient was evaluated and treated and all questions answered.  Discussed the etiology and treatment options for the condition in detail with the patient.  Recommended debridement of the nails today. Sharp and mechanical debridement performed of all painful and mycotic nails today. Nails debrided in length and thickness using a nail nipper to level of comfort. Discussed treatment options including appropriate shoe gear. Follow up as needed for painful nails.    Return in about 3 months (around 08/01/2023) for at risk diabetic foot care.

## 2023-06-01 ENCOUNTER — Ambulatory Visit: Payer: 59

## 2023-06-01 NOTE — Progress Notes (Signed)
 Patient presents to the office today for diabetic shoe and insole measuring.  Patient was measured with brannock device to determine size and width for 1 pair of extra depth shoes and foam casted for 3 pair of insoles.   Documentation of medical necessity will be sent to patient's treating diabetic doctor to verify and sign.   Patient's diabetic provider: Rush Farmer NP Caregiver taking ppw to [facility] office and will have MD sign   Shoes and insoles will be ordered at that time and patient will be notified for an appointment for fitting when they arrive.   Shoe size (per patient): 9 Shoe choice:   9WD Shoe size ordered: A700W / A830W  Ppw / ABN signed

## 2023-06-02 ENCOUNTER — Emergency Department (HOSPITAL_COMMUNITY)

## 2023-06-02 ENCOUNTER — Emergency Department (HOSPITAL_COMMUNITY)
Admission: EM | Admit: 2023-06-02 | Discharge: 2023-06-03 | Disposition: A | Discharge status: Home / Self Care | Attending: Emergency Medicine | Admitting: Emergency Medicine

## 2023-06-02 ENCOUNTER — Other Ambulatory Visit: Payer: Self-pay

## 2023-06-02 DIAGNOSIS — Z79899 Other long term (current) drug therapy: Secondary | ICD-10-CM | POA: Diagnosis not present

## 2023-06-02 DIAGNOSIS — Z7984 Long term (current) use of oral hypoglycemic drugs: Secondary | ICD-10-CM | POA: Insufficient documentation

## 2023-06-02 DIAGNOSIS — W19XXXA Unspecified fall, initial encounter: Secondary | ICD-10-CM | POA: Insufficient documentation

## 2023-06-02 DIAGNOSIS — M25569 Pain in unspecified knee: Secondary | ICD-10-CM

## 2023-06-02 DIAGNOSIS — M25562 Pain in left knee: Secondary | ICD-10-CM | POA: Insufficient documentation

## 2023-06-02 DIAGNOSIS — E119 Type 2 diabetes mellitus without complications: Secondary | ICD-10-CM | POA: Insufficient documentation

## 2023-06-02 DIAGNOSIS — I10 Essential (primary) hypertension: Secondary | ICD-10-CM | POA: Insufficient documentation

## 2023-06-02 DIAGNOSIS — Z7901 Long term (current) use of anticoagulants: Secondary | ICD-10-CM | POA: Insufficient documentation

## 2023-06-02 MED ORDER — ACETAMINOPHEN 500 MG PO TABS
1000.0000 mg | ORAL_TABLET | Freq: Once | ORAL | Status: AC
  Administered 2023-06-02: 1000 mg via ORAL
  Filled 2023-06-02: qty 2

## 2023-06-02 NOTE — ED Notes (Signed)
Patient laying in bed talking to self.

## 2023-06-02 NOTE — ED Notes (Signed)
 Lab at bedside

## 2023-06-02 NOTE — ED Triage Notes (Signed)
 Pt reports she missed the car seat and fell on the back of her calf. She is complaining of right leg hurts. Pt is difficult to understand.

## 2023-06-02 NOTE — ED Provider Notes (Signed)
 [place] EMERGENCY DEPARTMENT AT [facility] Provider Note   CSN: [id number] Arrival date & time: 06/02/23  1453     History {Add pertinent medical, surgical, social history, OB history to HPI:1} Chief Complaint  Patient presents with   Weakness   Fall    Maria Pace is a 83 y.o. female patient with past medical history of hypertension, hyperlipidemia, diabetes, schizophrenia, on Eliquis presenting to emergency room with fall.  Patient reports that she was trying to get into her vehicle when she missed the car seat falling backward and landing onto her calf.  She reports her knee was twisted when it fell and now she is having posterior knee pain she reports that since the accident she has not been able to ambulate.  She did not hit her head or lose consciousness.  She does not have any neck pain.  She is not having any back pain.  She is moving upper extremities without any difficulty, can move lower extremities but significant discomfort with ROM of right leg.  Denies any chest pain, shortness of breath, abdominal pain nausea vomiting or diarrhea.   Weakness Fall       Home Medications Prior to Admission medications   Medication Sig Start Date End Date Taking? Authorizing Provider  albuterol (VENTOLIN HFA) 108 (90 Base) MCG/ACT inhaler Inhale 1 puff into the lungs every 6 (six) hours as needed. 06/29/20   [provider]  amiodarone (PACERONE) 200 MG tablet Take 1 tablet (200 mg total) by mouth 2 (two) times daily for 21 days, THEN 1 tablet (200 mg total) daily. 11/02/22 11/23/23  Mallipeddi, Vishnu P, MD  apixaban (ELIQUIS) 5 MG TABS tablet Take 1 tablet (5 mg total) by mouth 2 (two) times daily. 10/10/19   Catarina Hartshorn, MD  ascorbic acid (VITAMIN C) 500 MG tablet Take 500 mg by mouth daily.    [provider]  atorvastatin (LIPITOR) 20 MG tablet Take 20 mg by mouth daily. 10/05/20   [provider]  Camphor-Eucalyptus-Menthol (VICKS VAPORUB)  4.7-1.2-2.6 % OINT Apply 1 application topically daily. Apply once to toenails daily    [provider]  cholecalciferol (VITAMIN D3) 10 MCG (400 UNIT) TABS tablet Take 1,000 Units by mouth daily.    [provider]  divalproex (DEPAKOTE ER) 250 MG 24 hr tablet Take 1 tablet (250 mg total) by mouth daily. 03/03/23 07/01/23  [name], MD  docusate sodium (COLACE) 100 MG capsule Take 200 mg by mouth daily.    [provider]  FEROSUL 325 (65 Fe) MG tablet Take 325 mg by mouth 2 (two) times daily with a meal. 10/05/20   [provider]  gabapentin (NEURONTIN) 400 MG capsule Take 400 mg by mouth at bedtime.    [provider]  latanoprost (XALATAN) 0.005 % ophthalmic solution Place 1 drop into both eyes at bedtime.    [provider]  loratadine (CLARITIN) 10 MG tablet Take 10 mg by mouth daily.    [provider]  metFORMIN (GLUCOPHAGE) 500 MG tablet Take 500 mg by mouth 2 (two) times daily with a meal.    [provider]  metoprolol tartrate (LOPRESSOR) 25 MG tablet Take 1 tablet (25 mg total) by mouth 2 (two) times daily. 10/10/19   Catarina Hartshorn, MD  Multiple Vitamin (MULTIVITAMIN) tablet Take 1 tablet by mouth daily.    [provider]  [facility] powder Apply 1 Application topically 3 (three) times daily. 06/28/22   [provider]  OLANZapine (ZYPREXA) 20 MG tablet Take 1 tablet (20 mg total) by mouth at bedtime. Total of 25 mg at night. Take along with 20 mg tab 04/05/23 08/03/23  [name], MD  OLANZapine (ZYPREXA) 5 MG tablet Take 1 tablet (5 mg total) by mouth at bedtime. Total of 25 mg at night. Take along with 20 mg tab 04/05/23 08/03/23  [name], MD  pantoprazole (PROTONIX) 40 MG tablet Take 40 mg by mouth daily.    [provider]  polyethylene glycol powder (GLYCOLAX/MIRALAX) powder Take 17 g by mouth daily as needed for mild constipation or moderate constipation.    [provider]   sacubitril-valsartan (ENTRESTO) 24-26 MG Take 1 tablet by mouth 2 (two) times daily. 11/10/22   Mallipeddi, Vishnu P, MD  senna-docusate (SENOKOT-S) 8.6-50 MG tablet Take 2 tablets by mouth at bedtime. 02/23/17   Erick Blinks, MD  spironolactone (ALDACTONE) 25 MG tablet Take 0.5 tablets (12.5 mg total) by mouth daily. 01/20/23 04/20/23  Mallipeddi, Vishnu P, MD  vitamin B-12 (CYANOCOBALAMIN) 500 MCG tablet Take 1 tablet (500 mcg total) by mouth daily. 10/10/19   Catarina Hartshorn, MD  zinc sulfate 220 (50 Zn) MG capsule Take 220 mg by mouth daily.    [provider]      Allergies    Haloperidol lactate    Review of Systems   Review of Systems  Neurological:  Positive for weakness.    Physical Exam Updated Vital Signs BP (!) 129/56 (BP Location: Left Arm)   Pulse 89   Temp 98.6 F (37 C) (Oral)   Resp 16   Ht 5' (1.524 m)   Wt 88 kg   SpO2 96%   BMI 37.89 kg/m  Physical Exam Vitals and nursing note reviewed.  Constitutional:      General: She is not in acute distress.    Appearance: She is not toxic-appearing.  HENT:     Head: Normocephalic and atraumatic.  Eyes:     General: No scleral icterus.    Conjunctiva/sclera: Conjunctivae normal.  Cardiovascular:     Rate and Rhythm: Normal rate and regular rhythm.     Pulses: Normal pulses.     Heart sounds: Normal heart sounds.  Pulmonary:     Effort: Pulmonary effort is normal. No respiratory distress.     Breath sounds: Normal breath sounds.  Abdominal:     General: Abdomen is flat. Bowel sounds are normal.     Palpations: Abdomen is soft.     Tenderness: There is no abdominal tenderness.  Musculoskeletal:     Right lower leg: No edema.     Left lower leg: No edema.     Comments: Tenderness to palpation over posterior knee and medial joint line.  No obvious swelling or deformity.  Patient able to initiate flexion but does not have good range of motion secondary to pain.  Skin:    General: Skin is warm and dry.      Findings: No lesion.  Neurological:     General: No focal deficit present.     Mental Status: She is alert and oriented to person, place, and time. Mental status is at baseline.     ED Results / Procedures / Treatments   Labs (all labs ordered are listed, but only abnormal results are displayed) Labs Reviewed - No data to display  EKG None  Radiology No results found.  Procedures Procedures  {Document cardiac monitor, telemetry assessment procedure when appropriate:1}  Medications  Ordered in ED Medications  acetaminophen (TYLENOL) tablet 1,000 mg (1,000 mg Oral Given 06/02/23 2056)    ED Course/ Medical Decision Making/ A&P   {   Click here for ABCD2, HEART and other calculatorsREFRESH Note before signing :1}                              Medical Decision Making Amount and/or Complexity of Data Reviewed Labs: ordered. Radiology: ordered.  Risk OTC drugs.   This patient presents to the ED for concern of right knee pain, this involves an extensive number of treatment options, and is a complaint that carries with it a high risk of complications and morbidity.  The differential diagnosis includes fracture, dislocation, subluxation, cellulitis, deep     Imaging Studies ordered:  I ordered imaging studies including x-ray of right knee, CT right knee  I independently visualized and interpreted imaging which showed Pending.  I agree with the radiologist interpretation     Problem List / ED Course / Critical interventions / Medication management  Reporting to emergency room with complaint of fall patient reports that she was trying to get into a vehicle when she fell landing on her leg.  She thinks she pulled a muscle.  She reports she has significant posterior knee pain that started after fall.  She is neurovascularly intact.  No obvious deformity noted.  No signs of infection.  She is not having any hip pain and is otherwise moving upper extremities without difficulty.   She is on Eliquis however when she fell she did not hit her head will injure anything else.  Since she did not hit her head and has no loss of consciousness or confusion do not feel imaging of head and neck are necessary.  She has no cervical midline tenderness on exam.  No chest pain and no abdominal tenderness thus I do not feel these need imaging either.  Hemodynamically stable and well-appearing.  I did review x-ray of knee do not see any acute abnormality.  Given the fact that she is not unable to ambulate much make sure not missing fracture which is why obtain CT of right knee. I ordered medication including Tylenol  Reevaluation of the patient after these medicines showed that the patient improved I have reviewed the patients home medicines and have made adjustments as needed   Plan  Sign out to Dr Pilar Plate dispo is pending imaging.   {Document critical care time when appropriate:1} {Document review of labs and clinical decision tools ie heart score, Chads2Vasc2 etc:1}  {Document your independent review of radiology images, and any outside records:1} {Document your discussion with family members, caretakers, and with consultants:1} {Document social determinants of health affecting pt's care:1} {Document your decision making why or why not admission, treatments were needed:1} Final Clinical Impression(s) / ED Diagnoses Final diagnoses:  None    Rx / DC Orders ED Discharge Orders     None

## 2023-06-02 NOTE — ED Provider Notes (Signed)
  Provider Note MRN:  [id number]  Arrival date & time: 06/03/23    ED Course and Medical Decision Making  Assumed care of patient at sign-out or upon transfer.  Knee pain with struggle ambulating due to the pain awaiting image.  Seems to be an isolated trauma, anticoagulated but did not hit her head.  12 AM update: Imaging does not reveal acute fracture, patient has arthritis to the knee.  On my exam patient denies pain, sitting comfortably.  Appropriate for discharge.  Procedures  Final Clinical Impressions(s) / ED Diagnoses     ICD-10-CM   1. Acute knee pain, unspecified laterality  M25.569       ED Discharge Orders     None         Discharge Instructions      You were evaluated in the Emergency Department and after careful evaluation, we did not find any emergent condition requiring admission or further testing in the hospital.  Your exam/testing today is overall reassuring.  Your x-ray/CT scan does not show any broken bones, you have arthritis in your knee.  Recommend Tylenol at home for discomfort, follow-up with orthopedic specialist if not getting better.  Please return to the Emergency Department if you experience any worsening of your condition.   Thank you for allowing [place] to be a part of your care.      Elmer Sow. Pilar Plate, MD [facility] Emergency Medicine [facility] [email or web address]    Sabas Sous, MD 06/03/23 [phone]

## 2023-06-03 LAB — BASIC METABOLIC PANEL
Anion gap: 8 (ref 5–15)
BUN: 31 mg/dL — ABNORMAL HIGH (ref 8–23)
CO2: 23 mmol/L (ref 22–32)
Calcium: 8.8 mg/dL — ABNORMAL LOW (ref 8.9–10.3)
Chloride: 101 mmol/L (ref 98–111)
Creatinine, Ser: 1.57 mg/dL — ABNORMAL HIGH (ref 0.44–1.00)
GFR, Estimated: 33 mL/min — ABNORMAL LOW (ref >60)
Glucose, Bld: 114 mg/dL — ABNORMAL HIGH (ref 70–99)
Potassium: 4.8 mmol/L (ref 3.5–5.1)
Sodium: 132 mmol/L — ABNORMAL LOW (ref 135–145)

## 2023-06-03 LAB — CBC
HCT: 28.4 % — ABNORMAL LOW (ref 36.0–46.0)
Hemoglobin: 9.1 g/dL — ABNORMAL LOW (ref 12.0–15.0)
MCH: 29.8 pg (ref 26.0–34.0)
MCHC: 32 g/dL (ref 30.0–36.0)
MCV: 93.1 fL (ref 80.0–100.0)
Platelets: 226 10*3/uL (ref 150–400)
RBC: 3.05 MIL/uL — ABNORMAL LOW (ref 3.87–5.11)
RDW: 16.1 % — ABNORMAL HIGH (ref 11.5–15.5)
WBC: 9.4 10*3/uL (ref 4.0–10.5)
nRBC: 0 % (ref 0.0–0.2)

## 2023-06-03 NOTE — ED Notes (Signed)
Patient able to stand with standby assistance.

## 2023-06-03 NOTE — ED Notes (Signed)
 Talked to [facility] about patient discharge.

## 2023-06-03 NOTE — Discharge Instructions (Signed)
 You were evaluated in the Emergency Department and after careful evaluation, we did not find any emergent condition requiring admission or further testing in the hospital.  Your exam/testing today is overall reassuring.  Your x-ray/CT scan does not show any broken bones, you have arthritis in your knee.  Recommend Tylenol at home for discomfort, follow-up with orthopedic specialist if not getting better.  Please return to the Emergency Department if you experience any worsening of your condition.   Thank you for allowing [place] to be a part of your care.

## 2023-07-01 NOTE — Progress Notes (Unsigned)
 No show

## 2023-07-06 ENCOUNTER — Ambulatory Visit (INDEPENDENT_AMBULATORY_CARE_PROVIDER_SITE_OTHER): Payer: Self-pay | Admitting: Psychiatry

## 2023-07-06 ENCOUNTER — Telehealth: Payer: Self-pay | Admitting: Psychiatry

## 2023-07-06 DIAGNOSIS — Z91199 Patient's noncompliance with other medical treatment and regimen due to unspecified reason: Secondary | ICD-10-CM

## 2023-07-06 NOTE — Telephone Encounter (Signed)
 This Clinical research associate contacted the patient after she did not show up for her scheduled appointment. According to Maria Pace she has established care with a new psychiatrist and no longer requires further appointments or medication refills.

## 2023-08-01 ENCOUNTER — Ambulatory Visit: Payer: 59 | Admitting: Podiatry

## 2023-08-30 ENCOUNTER — Encounter: Payer: Self-pay | Admitting: Internal Medicine

## 2023-08-30 ENCOUNTER — Ambulatory Visit: Attending: Internal Medicine | Admitting: Internal Medicine

## 2023-08-30 VITALS — BP 118/72 | HR 74

## 2023-08-30 DIAGNOSIS — I480 Paroxysmal atrial fibrillation: Secondary | ICD-10-CM

## 2023-08-30 DIAGNOSIS — I4891 Unspecified atrial fibrillation: Secondary | ICD-10-CM

## 2023-08-30 DIAGNOSIS — I5022 Chronic systolic (congestive) heart failure: Secondary | ICD-10-CM | POA: Diagnosis not present

## 2023-08-30 DIAGNOSIS — Z7901 Long term (current) use of anticoagulants: Secondary | ICD-10-CM | POA: Diagnosis not present

## 2023-08-30 DIAGNOSIS — Z9229 Personal history of other drug therapy: Secondary | ICD-10-CM

## 2023-08-30 NOTE — Patient Instructions (Signed)
 Medication Instructions:  Your physician recommends that you continue on your current medications as directed. Please refer to the Current Medication list given to you today.  *If you need a refill on your cardiac medications before your next appointment, please call your pharmacy*  Lab Work: TSH  If you have labs (blood work) drawn today and your tests are completely normal, you will receive your results only by: MyChart Message (if you have MyChart) OR A paper copy in the mail If you have any lab test that is abnormal or we need to change your treatment, we will call you to review the results.  Testing/Procedures: Your physician has requested that you have an echocardiogram. Echocardiography is a painless test that uses sound waves to create images of your heart. It provides your doctor with information about the size and shape of your heart and how well your heart's chambers and valves are working. This procedure takes approximately one hour. There are no restrictions for this procedure. Please do NOT wear cologne, perfume, aftershave, or lotions (deodorant is allowed). Please arrive 15 minutes prior to your appointment time.  Please note: We ask at that you not bring children with you during ultrasound (echo/ vascular) testing. Due to room size and safety concerns, children are not allowed in the ultrasound rooms during exams. Our front office staff cannot provide observation of children in our lobby area while testing is being conducted. An adult accompanying a patient to their appointment will only be allowed in the ultrasound room at the discretion of the ultrasound technician under special circumstances. We apologize for any inconvenience.   Your physician has recommended that you have a pulmonary function test. Pulmonary Function Tests are a group of tests that measure how well air moves in and out of your lungs.   Follow-Up: At [facility], you and your health needs are  our priority.  As part of our continuing mission to provide you with exceptional heart care, our providers are all part of one team.  This team includes your primary Cardiologist (physician) and Advanced Practice Providers or APPs (Physician Assistants and Nurse Practitioners) who all work together to provide you with the care you need, when you need it.  Your next appointment:   1 year(s)  Provider:   You may see Vishnu P Mallipeddi, MD or one of the following Advanced Practice Providers on your designated Care Team:   [place], PA-C  [place], [place] Theotis Flake, [place]     We recommend signing up for the patient portal called "MyChart".  Sign up information is provided on this After Visit Summary.  MyChart is used to connect with patients for Virtual Visits (Telemedicine).  Patients are able to view lab/test results, encounter notes, upcoming appointments, etc.  Non-urgent messages can be sent to your provider as well.   To learn more about what you can do with MyChart, go to [email or web address].   Other Instructions

## 2023-08-30 NOTE — Progress Notes (Unsigned)
 Cardiology Office Note  Date: 08/30/2023   ID: Maria Pace, DOB 1940-10-14, MRN [id number]  PCP:  Natha Bair, NP  Cardiologist:  Lasalle Pointer, MD Electrophysiologist:  None    History of Present Illness: Maria Pace is a 83 y.o. female known to have chronic systolic heart failure with LVEF 30 to 35% and RWMA, history of SVT, HTN, DM 2, HLD,  Lives in a facility, accompanied by caretaker.  Patient was initially referred to cardiology clinic in 2021 January for abnormal EKG. it was noted that she had atrial tachycardia and medical management was recommended.  She was lost to follow-up and referred back to cardiology clinic for management of chronic systolic heart failure with LVEF 30 to 35% with RWMA. Patient was admitted recently in 06/2022 with COVID-19 infection and had paroxysms of SVT.  I reviewed the cardiac monitoring strips from the hospitalization that showed SVT, atrial tachycardia and possibly atrial flutter (per the scanned report made this difficult to interpret). She underwent event monitor in 5/24 that showed 402 runs of SVT, fastest lasting 3 minutes 40 seconds and the longest lasting 28 minutes 45 seconds. Not all the strips are uploaded but the SVT runs appear to be atrial fibrillation. No patient triggered events were noted. There was also 2.2% PVC burden.  She is here for follow-up visit, accompanied by caretaker. She does not have any symptoms of angina, DOE, palpitations, fatigue, syncope, dizziness or leg swelling.  Patient has her sister listed as next of kin but I asked the caretaker to clarify this next time I see the patient.  Does not want any aggressive interventions at this time.    Past Medical History:  Diagnosis Date   Bipolar 1 disorder (HCC)    Essential hypertension    GERD (gastroesophageal reflux disease)    Hyperlipidemia    Irregular heart beat    Schizophrenia (HCC)    Type 2 diabetes mellitus (HCC)     Past Surgical History:   Procedure Laterality Date   YAG LASER APPLICATION Left 01/06/2015   Procedure: YAG LASER APPLICATION;  Surgeon: Albert Huff, MD;  Location: [facility];  Service: Ophthalmology;  Laterality: Left;    Current Outpatient Medications  Medication Sig Dispense Refill   albuterol  (VENTOLIN  HFA) 108 (90 Base) MCG/ACT inhaler Inhale 1 puff into the lungs every 6 (six) hours as needed.     amiodarone  (PACERONE ) 200 MG tablet Take 1 tablet (200 mg total) by mouth 2 (two) times daily for 21 days, THEN 1 tablet (200 mg total) daily. 60 tablet 6   apixaban  (ELIQUIS ) 5 MG TABS tablet Take 1 tablet (5 mg total) by mouth 2 (two) times daily. 60 tablet    ascorbic acid  (VITAMIN C ) 500 MG tablet Take 500 mg by mouth daily.     atorvastatin  (LIPITOR) 20 MG tablet Take 20 mg by mouth daily.     Camphor-Eucalyptus-Menthol (VICKS VAPORUB) 4.7-1.2-2.6 % OINT Apply 1 application topically daily. Apply once to toenails daily     cholecalciferol (VITAMIN D3) 10 MCG (400 UNIT) TABS tablet Take 1,000 Units by mouth daily.     docusate sodium  (COLACE) 100 MG capsule Take 200 mg by mouth daily.     FEROSUL 325 (65 Fe) MG tablet Take 325 mg by mouth 2 (two) times daily with a meal.     gabapentin  (NEURONTIN ) 400 MG capsule Take 400 mg by mouth at bedtime.     latanoprost  (XALATAN ) 0.005 % ophthalmic solution Place 1  drop into both eyes at bedtime.     loratadine  (CLARITIN ) 10 MG tablet Take 10 mg by mouth daily.     metFORMIN (GLUCOPHAGE) 500 MG tablet Take 500 mg by mouth 2 (two) times daily with a meal.     metoprolol  tartrate (LOPRESSOR ) 25 MG tablet Take 1 tablet (25 mg total) by mouth 2 (two) times daily.     Multiple Vitamin (MULTIVITAMIN) tablet Take 1 tablet by mouth daily.     NYAMYC  powder Apply 1 Application topically 3 (three) times daily.     pantoprazole  (PROTONIX ) 40 MG tablet Take 40 mg by mouth daily.     polyethylene glycol powder (GLYCOLAX /MIRALAX ) powder Take 17 g by mouth daily as needed for mild  constipation or moderate constipation.     sacubitril -valsartan  (ENTRESTO ) 24-26 MG Take 1 tablet by mouth 2 (two) times daily. 60 tablet 11   senna-docusate (SENOKOT-S) 8.6-50 MG tablet Take 2 tablets by mouth at bedtime. 60 tablet 0   vitamin B-12 (CYANOCOBALAMIN ) 500 MCG tablet Take 1 tablet (500 mcg total) by mouth daily.     zinc  sulfate 220 (50 Zn) MG capsule Take 220 mg by mouth daily.     divalproex  (DEPAKOTE  ER) 250 MG 24 hr tablet Take 1 tablet (250 mg total) by mouth daily. 30 tablet 3   OLANZapine  (ZYPREXA ) 20 MG tablet Take 1 tablet (20 mg total) by mouth at bedtime. Total of 25 mg at night. Take along with 20 mg tab 30 tablet 3   OLANZapine  (ZYPREXA ) 5 MG tablet Take 1 tablet (5 mg total) by mouth at bedtime. Total of 25 mg at night. Take along with 20 mg tab 30 tablet 3   spironolactone  (ALDACTONE ) 25 MG tablet Take 0.5 tablets (12.5 mg total) by mouth daily. 90 tablet 3   No current facility-administered medications for this visit.   Allergies:  Haloperidol lactate   Social History: The patient  reports that she has never smoked. She has never used smokeless tobacco. She reports that she does not drink alcohol  and does not use drugs.   Family History: The patient's family history includes Diabetes type II in her father; Hypertension in her father.   ROS:  Please see the history of present illness. Otherwise, complete review of systems is positive for none.  All other systems are reviewed and negative.   Physical Exam: VS:  BP 118/72 (BP Location: Left Arm, Patient Position: Sitting)   Pulse 74   SpO2 93% , BMI There is no height or weight on file to calculate BMI.  Wt Readings from Last 3 Encounters:  06/02/23 194 lb (88 kg)  11/02/22 182 lb 9.6 oz (82.8 kg)  07/27/22 181 lb 3.2 oz (82.2 kg)    General: Patient appears comfortable at rest. HEENT: Conjunctiva and lids normal, oropharynx clear with moist mucosa. Neck: Supple, no elevated JVP or carotid bruits, no  thyromegaly. Lungs: Clear to auscultation, nonlabored breathing at rest. Cardiac: Regular rate and rhythm, no S3 or significant systolic murmur, no pericardial rub. Abdomen: Soft, nontender, no hepatomegaly, bowel sounds present, no guarding or rebound. Extremities: No pitting edema, distal pulses 2+. Skin: Warm and dry. Musculoskeletal: No kyphosis. Neuropsychiatric: Alert and oriented x3, affect grossly appropriate.  Recent Labwork: 11/09/2022: TSH 5.830 01/12/2023: ALT 17; AST 19 06/02/2023: BUN 31; Creatinine, Ser 1.57; Hemoglobin 9.1; Platelets 226; Potassium 4.8; Sodium 132     Component Value Date/Time   CHOL 168 10/10/2019 0701   CHOL 204 (H) 10/30/2013 0501  TRIG 54 10/10/2019 0701   TRIG 240 (H) 10/30/2013 0501   HDL 37 (L) 10/10/2019 0701   HDL 38 (L) 10/30/2013 0501   CHOLHDL 4.5 10/10/2019 0701   VLDL 11 10/10/2019 0701   VLDL 48 (H) 10/30/2013 0501   LDLCALC 120 (H) 10/10/2019 0701   LDLCALC 118 (H) 10/30/2013 0501    Other Studies Reviewed Today: Echocardiogram in 07/13/2022 LVEF 30 to 35% with RWMA Indeterminate LV diastolic parameters RV systolic function is normal LA mildly dilated Mild to moderate MR  Assessment and Plan: Patient is a 83 year old F known to have chronic systolic heart failure with LVEF 30 to 35% and RWMA, paroxysmal A-fib, HTN, DM 2, HLD, history of SVT is here for follow-up visit.  # Paroxysmal A-fib -Event monitor from 03/2019 showed atrial tachycardia for which medical management was recommended. I reviewed the cardiac monitoring strips from the hospitalization in April 2024 that showed SVT and not atrial fibrillation. There was a possibility of atrial flutter but the baseline artifact made it difficult to interpret. However event monitor from 5/24 showed 408 SVT runs, some of them are atrial fibrillation. I will continue metoprolol  tartrate 25 mg twice daily and Eliquis  5 mg twice daily.  No risk of falls.  EKG today showed NSR however due  to intermittent paroxysmal A-fib with RVR, will start amiodarone  200 mg twice daily for 3 weeks followed by amiodarone  200 mg once daily.  Obtain baseline TSH and TSH in 3 months. -Will obtain 2-week event monitor in 6 months to quantify the burden of SVT/atrial fibrillation.  # Chronic systolic heart failure LVEF 30-35% with RWMA -Patient has no symptoms of DOE or angina.  She does not do much at home, walks only from bedroom to the living room. Patient does not prefer any procedures at this time.  Will defer ischemia evaluation unless she presents with ACS.  Her sister is listed as POA (according to the caretaker) but I do not have documentation of the same. -Continue GDMT with metoprolol  tartrate 25 mg twice daily, increase Entresto  from 24-26 mg to 49-51 mg twice daily, continue spironolactone  25 mg once daily.  Obtain BMP in 5 days.  Echocardiogram will be repeated in the next clinic visit (after event monitor is resulted).  # HTN: Continue GDMT as stated above # HLD: Continue atorvastatin  20 mg nightly   I have spent a total of 30 minutes with patient reviewing chart, EKGs, labs and examining patient as well as establishing an assessment and plan that was discussed with the patient.  > 50% of time was spent in direct patient care.    Medication Adjustments/Labs and Tests Ordered: Current medicines are reviewed at length with the patient today.  Concerns regarding medicines are outlined above.   Tests Ordered: No orders of the defined types were placed in this encounter.    Medication Changes: No orders of the defined types were placed in this encounter.     Disposition:  Follow up 8 months  Signed, Koltan Portocarrero Beauford Bounds, MD, 08/30/2023 11:59 AM    [facility] at [name] [address], [place], [place] [address]

## 2023-09-01 DIAGNOSIS — Z7901 Long term (current) use of anticoagulants: Secondary | ICD-10-CM | POA: Insufficient documentation

## 2023-09-01 DIAGNOSIS — I480 Paroxysmal atrial fibrillation: Secondary | ICD-10-CM | POA: Insufficient documentation

## 2023-09-26 ENCOUNTER — Ambulatory Visit (HOSPITAL_COMMUNITY)
Admission: RE | Admit: 2023-09-26 | Discharge: 2023-09-26 | Disposition: A | Discharge status: Home / Self Care | Source: Ambulatory Visit | Attending: Internal Medicine | Admitting: Internal Medicine

## 2023-09-26 DIAGNOSIS — Z9229 Personal history of other drug therapy: Secondary | ICD-10-CM | POA: Diagnosis present

## 2023-09-26 DIAGNOSIS — I5022 Chronic systolic (congestive) heart failure: Secondary | ICD-10-CM | POA: Insufficient documentation

## 2023-09-26 LAB — PULMONARY FUNCTION TEST
DL/VA % pred: 126 %
DL/VA: 5.39 ml/min/mmHg/L
DLCO unc % pred: 79 %
DLCO unc: 12.73 ml/min/mmHg
FEF 25-75 Post: 1.24 L/s
FEF 25-75 Pre: 1.09 L/s
FEF2575-%Change-Post: 13 %
FEF2575-%Pred-Post: 104 %
FEF2575-%Pred-Pre: 92 %
FEV1-%Change-Post: 0 %
FEV1-%Pred-Post: 91 %
FEV1-%Pred-Pre: 90 %
FEV1-Post: 1.43 L
FEV1-Pre: 1.42 L
FEV1FVC-%Change-Post: -1 %
FEV1FVC-%Pred-Pre: 99 %
FEV6-%Change-Post: 2 %
FEV6-%Pred-Post: 98 %
FEV6-%Pred-Pre: 96 %
FEV6-Post: 1.98 L
FEV6-Pre: 1.93 L
FEV6FVC-%Change-Post: 0 %
FEV6FVC-%Pred-Post: 106 %
FEV6FVC-%Pred-Pre: 105 %
FVC-%Change-Post: 2 %
FVC-%Pred-Post: 92 %
FVC-%Pred-Pre: 90 %
FVC-Post: 1.98 L
FVC-Pre: 1.94 L
Post FEV1/FVC ratio: 72 %
Post FEV6/FVC ratio: 100 %
Pre FEV1/FVC ratio: 73 %
Pre FEV6/FVC Ratio: 100 %

## 2023-09-26 LAB — ECHOCARDIOGRAM COMPLETE
AR max vel: 1.55 cm2
AV Area VTI: 1.18 cm2
AV Area mean vel: 1.34 cm2
AV Mean grad: 5 mmHg
AV Peak grad: 8.8 mmHg
Ao pk vel: 1.48 m/s
Area-P 1/2: 4.31 cm2
MV M vel: 4.43 m/s
MV Peak grad: 78.5 mmHg
Radius: 0.5 cm
S' Lateral: 4.6 cm

## 2023-09-26 MED ORDER — ALBUTEROL SULFATE (2.5 MG/3ML) 0.083% IN NEBU
2.5000 mg | INHALATION_SOLUTION | Freq: Once | RESPIRATORY_TRACT | Status: AC
  Administered 2023-09-26: 2.5 mg via RESPIRATORY_TRACT

## 2023-09-26 NOTE — Progress Notes (Signed)
*  PRELIMINARY RESULTS* Echocardiogram 2D Echocardiogram has been performed.  Maria Pace 09/26/2023, 4:04 PM

## 2023-10-02 ENCOUNTER — Ambulatory Visit: Payer: Self-pay | Admitting: Internal Medicine

## 2023-10-20 ENCOUNTER — Emergency Department (HOSPITAL_COMMUNITY)

## 2023-10-20 ENCOUNTER — Encounter (HOSPITAL_COMMUNITY): Payer: Self-pay

## 2023-10-20 ENCOUNTER — Other Ambulatory Visit: Payer: Self-pay

## 2023-10-20 ENCOUNTER — Observation Stay (HOSPITAL_COMMUNITY)
Admission: EM | Admit: 2023-10-20 | Discharge: 2023-10-26 | Disposition: A | Discharge status: Home / Self Care | Source: Skilled Nursing Facility | Attending: Internal Medicine | Admitting: Internal Medicine

## 2023-10-20 DIAGNOSIS — Z7901 Long term (current) use of anticoagulants: Secondary | ICD-10-CM | POA: Insufficient documentation

## 2023-10-20 DIAGNOSIS — I130 Hypertensive heart and chronic kidney disease with heart failure and stage 1 through stage 4 chronic kidney disease, or unspecified chronic kidney disease: Secondary | ICD-10-CM | POA: Diagnosis not present

## 2023-10-20 DIAGNOSIS — D62 Acute posthemorrhagic anemia: Secondary | ICD-10-CM | POA: Diagnosis not present

## 2023-10-20 DIAGNOSIS — E782 Mixed hyperlipidemia: Secondary | ICD-10-CM | POA: Insufficient documentation

## 2023-10-20 DIAGNOSIS — K921 Melena: Secondary | ICD-10-CM | POA: Diagnosis not present

## 2023-10-20 DIAGNOSIS — F209 Schizophrenia, unspecified: Secondary | ICD-10-CM | POA: Insufficient documentation

## 2023-10-20 DIAGNOSIS — I5022 Chronic systolic (congestive) heart failure: Secondary | ICD-10-CM | POA: Insufficient documentation

## 2023-10-20 DIAGNOSIS — I480 Paroxysmal atrial fibrillation: Secondary | ICD-10-CM | POA: Diagnosis not present

## 2023-10-20 DIAGNOSIS — N1832 Chronic kidney disease, stage 3b: Secondary | ICD-10-CM | POA: Diagnosis not present

## 2023-10-20 DIAGNOSIS — E1122 Type 2 diabetes mellitus with diabetic chronic kidney disease: Secondary | ICD-10-CM | POA: Insufficient documentation

## 2023-10-20 DIAGNOSIS — D649 Anemia, unspecified: Principal | ICD-10-CM

## 2023-10-20 DIAGNOSIS — R799 Abnormal finding of blood chemistry, unspecified: Secondary | ICD-10-CM | POA: Diagnosis present

## 2023-10-20 DIAGNOSIS — Z794 Long term (current) use of insulin: Secondary | ICD-10-CM | POA: Diagnosis not present

## 2023-10-20 LAB — CBC WITH DIFFERENTIAL/PLATELET
Abs Immature Granulocytes: 0.07 K/uL (ref 0.00–0.07)
Basophils Absolute: 0 K/uL (ref 0.0–0.1)
Basophils Relative: 0 %
Eosinophils Absolute: 0.2 K/uL (ref 0.0–0.5)
Eosinophils Relative: 2 %
HCT: 21.1 % — ABNORMAL LOW (ref 36.0–46.0)
Hemoglobin: 6.6 g/dL — CL (ref 12.0–15.0)
Immature Granulocytes: 1 %
Lymphocytes Relative: 18 %
Lymphs Abs: 1.6 K/uL (ref 0.7–4.0)
MCH: 30.7 pg (ref 26.0–34.0)
MCHC: 31.3 g/dL (ref 30.0–36.0)
MCV: 98.1 fL (ref 80.0–100.0)
Monocytes Absolute: 0.8 K/uL (ref 0.1–1.0)
Monocytes Relative: 10 %
Neutro Abs: 6 K/uL (ref 1.7–7.7)
Neutrophils Relative %: 69 %
Platelets: 240 K/uL (ref 150–400)
RBC: 2.15 MIL/uL — ABNORMAL LOW (ref 3.87–5.11)
RDW: 18.1 % — ABNORMAL HIGH (ref 11.5–15.5)
WBC: 8.7 K/uL (ref 4.0–10.5)
nRBC: 0.6 % — ABNORMAL HIGH (ref 0.0–0.2)

## 2023-10-20 LAB — RETICULOCYTES
Immature Retic Fract: 33.8 % — ABNORMAL HIGH (ref 2.3–15.9)
RBC.: 2.15 MIL/uL — ABNORMAL LOW (ref 3.87–5.11)
Retic Count, Absolute: 112.9 K/uL (ref 19.0–186.0)
Retic Ct Pct: 5.3 % — ABNORMAL HIGH (ref 0.4–3.1)

## 2023-10-20 LAB — HEMOGLOBIN AND HEMATOCRIT, BLOOD
HCT: 27.8 % — ABNORMAL LOW (ref 36.0–46.0)
Hemoglobin: 8.8 g/dL — ABNORMAL LOW (ref 12.0–15.0)

## 2023-10-20 LAB — BASIC METABOLIC PANEL WITH GFR
Anion gap: 12 (ref 5–15)
BUN: 29 mg/dL — ABNORMAL HIGH (ref 8–23)
CO2: 20 mmol/L — ABNORMAL LOW (ref 22–32)
Calcium: 8.4 mg/dL — ABNORMAL LOW (ref 8.9–10.3)
Chloride: 105 mmol/L (ref 98–111)
Creatinine, Ser: 1.69 mg/dL — ABNORMAL HIGH (ref 0.44–1.00)
GFR, Estimated: 30 mL/min — ABNORMAL LOW (ref >60)
Glucose, Bld: 129 mg/dL — ABNORMAL HIGH (ref 70–99)
Potassium: 4.4 mmol/L (ref 3.5–5.1)
Sodium: 137 mmol/L (ref 135–145)

## 2023-10-20 LAB — IRON AND TIBC
Iron: 26 ug/dL — ABNORMAL LOW (ref 28–170)
Saturation Ratios: 7 % — ABNORMAL LOW (ref 10.4–31.8)
TIBC: 353 ug/dL (ref 250–450)
UIBC: 327 ug/dL

## 2023-10-20 LAB — ABO/RH: ABO/RH(D): O POS

## 2023-10-20 LAB — FOLATE: Folate: >40 ng/mL (ref >5.9)

## 2023-10-20 LAB — PREPARE RBC (CROSSMATCH)

## 2023-10-20 LAB — BRAIN NATRIURETIC PEPTIDE: B Natriuretic Peptide: 228 pg/mL — ABNORMAL HIGH (ref 0.0–100.0)

## 2023-10-20 LAB — VITAMIN B12: Vitamin B-12: 2177 pg/mL — ABNORMAL HIGH (ref 180–914)

## 2023-10-20 LAB — FERRITIN: Ferritin: 10 ng/mL — ABNORMAL LOW (ref 11–307)

## 2023-10-20 MED ORDER — VITAMIN C 500 MG PO TABS
500.0000 mg | ORAL_TABLET | Freq: Every day | ORAL | Status: DC
  Administered 2023-10-20 – 2023-10-26 (×7): 500 mg via ORAL
  Filled 2023-10-20 (×7): qty 1

## 2023-10-20 MED ORDER — LORATADINE 10 MG PO TABS
10.0000 mg | ORAL_TABLET | Freq: Every day | ORAL | Status: DC
  Administered 2023-10-20 – 2023-10-26 (×7): 10 mg via ORAL
  Filled 2023-10-20 (×7): qty 1

## 2023-10-20 MED ORDER — VITAMIN D 25 MCG (1000 UNIT) PO TABS
1000.0000 [IU] | ORAL_TABLET | Freq: Every day | ORAL | Status: DC
  Administered 2023-10-21 – 2023-10-26 (×6): 1000 [IU] via ORAL
  Filled 2023-10-20 (×6): qty 1

## 2023-10-20 MED ORDER — SPIRONOLACTONE 12.5 MG HALF TABLET: 12.5000 mg | ORAL_TABLET | Freq: Every day | ORAL | Status: DC

## 2023-10-20 MED ORDER — POLYETHYLENE GLYCOL 3350 17 GM/SCOOP PO POWD: 17.0000 g | Freq: Every day | ORAL | Status: DC | PRN

## 2023-10-20 MED ORDER — SENNOSIDES-DOCUSATE SODIUM 8.6-50 MG PO TABS
2.0000 | ORAL_TABLET | Freq: Every day | ORAL | Status: DC
  Administered 2023-10-20 – 2023-10-25 (×6): 2 via ORAL
  Filled 2023-10-20 (×6): qty 2

## 2023-10-20 MED ORDER — METOPROLOL TARTRATE 25 MG PO TABS
25.0000 mg | ORAL_TABLET | Freq: Two times a day (BID) | ORAL | Status: DC
  Administered 2023-10-20: 25 mg via ORAL
  Filled 2023-10-20: qty 1

## 2023-10-20 MED ORDER — FERROUS SULFATE 325 (65 FE) MG PO TABS
325.0000 mg | ORAL_TABLET | Freq: Two times a day (BID) | ORAL | Status: DC
  Administered 2023-10-21 – 2023-10-26 (×10): 325 mg via ORAL
  Filled 2023-10-20 (×10): qty 1

## 2023-10-20 MED ORDER — ATORVASTATIN CALCIUM 20 MG PO TABS
20.0000 mg | ORAL_TABLET | Freq: Every day | ORAL | Status: DC
  Administered 2023-10-21 – 2023-10-26 (×6): 20 mg via ORAL
  Filled 2023-10-20 (×6): qty 1

## 2023-10-20 MED ORDER — OLANZAPINE 5 MG PO TABS
20.0000 mg | ORAL_TABLET | Freq: Every day | ORAL | Status: DC
  Administered 2023-10-20 – 2023-10-25 (×6): 20 mg via ORAL
  Filled 2023-10-20 (×6): qty 4

## 2023-10-20 MED ORDER — PANTOPRAZOLE SODIUM 40 MG IV SOLR
40.0000 mg | Freq: Two times a day (BID) | INTRAVENOUS | Status: DC
  Administered 2023-10-20 – 2023-10-25 (×11): 40 mg via INTRAVENOUS
  Filled 2023-10-20 (×12): qty 10

## 2023-10-20 MED ORDER — ONDANSETRON HCL 4 MG PO TABS: 4.0000 mg | ORAL_TABLET | Freq: Four times a day (QID) | ORAL | Status: DC | PRN

## 2023-10-20 MED ORDER — GABAPENTIN 400 MG PO CAPS
400.0000 mg | ORAL_CAPSULE | Freq: Every day | ORAL | Status: DC
  Administered 2023-10-20 – 2023-10-25 (×6): 400 mg via ORAL
  Filled 2023-10-20 (×6): qty 1

## 2023-10-20 MED ORDER — LATANOPROST 0.005 % OP SOLN
1.0000 [drp] | Freq: Every day | OPHTHALMIC | Status: DC
  Administered 2023-10-20 – 2023-10-25 (×6): 1 [drp] via OPHTHALMIC
  Filled 2023-10-20 (×2): qty 2.5

## 2023-10-20 MED ORDER — ONDANSETRON HCL 4 MG/2ML IJ SOLN: 4.0000 mg | Freq: Four times a day (QID) | INTRAMUSCULAR | Status: DC | PRN

## 2023-10-20 MED ORDER — ADULT MULTIVITAMIN W/MINERALS CH
1.0000 | ORAL_TABLET | Freq: Every day | ORAL | Status: DC
  Administered 2023-10-20 – 2023-10-26 (×7): 1 via ORAL
  Filled 2023-10-20 (×7): qty 1

## 2023-10-20 MED ORDER — ACETAMINOPHEN 325 MG PO TABS: 650.0000 mg | ORAL_TABLET | Freq: Four times a day (QID) | ORAL | Status: DC | PRN

## 2023-10-20 MED ORDER — SODIUM CHLORIDE 0.9% IV SOLUTION: Freq: Once | INTRAVENOUS | Status: AC

## 2023-10-20 MED ORDER — FUROSEMIDE 10 MG/ML IJ SOLN
40.0000 mg | Freq: Once | INTRAMUSCULAR | Status: AC
  Administered 2023-10-20: 40 mg via INTRAVENOUS
  Filled 2023-10-20: qty 4

## 2023-10-20 MED ORDER — VITAMIN B-12 100 MCG PO TABS
500.0000 ug | ORAL_TABLET | Freq: Every day | ORAL | Status: DC
  Administered 2023-10-20 – 2023-10-26 (×7): 500 ug via ORAL
  Filled 2023-10-20 (×7): qty 5

## 2023-10-20 MED ORDER — AMIODARONE HCL 200 MG PO TABS
200.0000 mg | ORAL_TABLET | Freq: Every day | ORAL | Status: DC
  Administered 2023-10-20 – 2023-10-26 (×7): 200 mg via ORAL
  Filled 2023-10-20 (×7): qty 1

## 2023-10-20 MED ORDER — OLANZAPINE 5 MG PO TABS
5.0000 mg | ORAL_TABLET | Freq: Every day | ORAL | Status: DC
  Administered 2023-10-20 – 2023-10-25 (×6): 5 mg via ORAL
  Filled 2023-10-20 (×7): qty 1

## 2023-10-20 MED ORDER — PANTOPRAZOLE SODIUM 40 MG IV SOLR
80.0000 mg | Freq: Once | INTRAVENOUS | Status: AC
  Administered 2023-10-26: 80 mg via INTRAVENOUS

## 2023-10-20 MED ORDER — SACUBITRIL-VALSARTAN 24-26 MG PO TABS
1.0000 | ORAL_TABLET | Freq: Two times a day (BID) | ORAL | Status: DC
  Administered 2023-10-20 – 2023-10-25 (×10): 1 via ORAL
  Filled 2023-10-20 (×10): qty 1

## 2023-10-20 MED ORDER — DIVALPROEX SODIUM ER 250 MG PO TB24
250.0000 mg | ORAL_TABLET | Freq: Every day | ORAL | Status: DC
  Administered 2023-10-20 – 2023-10-26 (×7): 250 mg via ORAL
  Filled 2023-10-20 (×11): qty 1

## 2023-10-20 MED ORDER — CHLORHEXIDINE GLUCONATE CLOTH 2 % EX PADS
6.0000 | MEDICATED_PAD | Freq: Every day | CUTANEOUS | Status: DC
  Administered 2023-10-20 – 2023-10-24 (×5): 6 via TOPICAL

## 2023-10-20 MED ORDER — ACETAMINOPHEN 650 MG RE SUPP: 650.0000 mg | Freq: Four times a day (QID) | RECTAL | Status: DC | PRN

## 2023-10-20 NOTE — ED Triage Notes (Signed)
 Pt presents with low hgb.

## 2023-10-20 NOTE — ED Notes (Signed)
 Patient is from [facility], Randie Minerva runs the facility [phone].

## 2023-10-20 NOTE — ED Notes (Signed)
 Attempted to call report. Per floor, they're at capacity and not able to take patient at time.

## 2023-10-20 NOTE — Plan of Care (Signed)

## 2023-10-20 NOTE — ED Notes (Signed)
 ED TO INPATIENT HANDOFF REPORT  ED Nurse Name and Phone #: [phone]- [place]inda  S Name/Age/Gender Maria Pace 83 y.o. female Room/Bed: APA18/APA18  Code Status   Code Status: Prior  Home/SNF/Other [name]  Patient oriented to: self, place, time, and situation Is this baseline? Yes   Triage Complete: Triage complete  Chief Complaint Acute blood loss anemia [D62]  Triage Note Pt presents with low hgb.   Allergies Allergies  Allergen Reactions   Haloperidol Lactate Other (See Comments)    Couldn't urinate anymore    Level of Care/Admitting Diagnosis ED Disposition     ED Disposition  Admit   Condition  --   Comment  Hospital Area: [facility] [[id number]]  Level of Care: Telemetry [5]  Covid Evaluation: Asymptomatic - no recent exposure (last 10 days) testing not required  Diagnosis: Acute blood loss anemia [820537]  Admitting Physician: TAT, DAVID [[id number]]  Attending Physician: TAT, DAVID [[id number]]  For patients discharging to extended facilities (i.e. SNF, AL, group homes or LTAC) initiate:: Discharge to SNF/Facility Placement COVID-19 Lab Testing Protocol          B Medical/Surgery History Past Medical History:  Diagnosis Date   Bipolar 1 disorder (HCC)    Essential hypertension    GERD (gastroesophageal reflux disease)    Hyperlipidemia    Irregular heart beat    Schizophrenia (HCC)    Type 2 diabetes mellitus (HCC)    Past Surgical History:  Procedure Laterality Date   YAG LASER APPLICATION Left 01/06/2015   Procedure: YAG LASER APPLICATION;  Surgeon: Oneil Platts, MD;  Location: [facility];  Service: Ophthalmology;  Laterality: Left;     A IV Location/Drains/Wounds Patient Lines/Drains/Airways Status     Active Line/Drains/Airways     Name Placement date Placement time Site Days   Peripheral IV 10/20/23 20 G 1 Right Antecubital 10/20/23  1510  Antecubital  less than 1            Intake/Output Last 24  hours  Intake/Output Summary (Last 24 hours) at 10/20/2023 1711 Last data filed at 10/20/2023 1701 Gross per 24 hour  Intake 0 ml  Output --  Net 0 ml    Labs/Imaging Results for orders placed or performed during the hospital encounter of 10/20/23 (from the past 48 hours)  ABO/Rh     Status: None   Collection Time: 10/20/23  3:05 PM  Result Value Ref Range   ABO/RH(D)      O POS Performed at [facility], [address], [place], [place] [address]   CBC with Differential     Status: Abnormal   Collection Time: 10/20/23  3:07 PM  Result Value Ref Range   WBC 8.7 4.0 - 10.5 K/uL   RBC 2.15 (L) 3.87 - 5.11 MIL/uL   Hemoglobin 6.6 (LL) 12.0 - 15.0 g/dL    Comment: REPEATED TO VERIFY THIS CRITICAL RESULT HAS VERIFIED AND BEEN CALLED TO J MARK,RN BY MARIE KELLY ON 07 25 2025 AT 1543, AND HAS BEEN READ BACK. RESULTS REPEATED TO VERIFY    HCT 21.1 (L) 36.0 - 46.0 %   MCV 98.1 80.0 - 100.0 fL   MCH 30.7 26.0 - 34.0 pg   MCHC 31.3 30.0 - 36.0 g/dL   RDW 81.8 (H) 88.4 - 84.4 %   Platelets 240 150 - 400 K/uL   nRBC 0.6 (H) 0.0 - 0.2 %   Neutrophils Relative % 69 %   Neutro Abs 6.0 1.7 - 7.7  K/uL   Lymphocytes Relative 18 %   Lymphs Abs 1.6 0.7 - 4.0 K/uL   Monocytes Relative 10 %   Monocytes Absolute 0.8 0.1 - 1.0 K/uL   Eosinophils Relative 2 %   Eosinophils Absolute 0.2 0.0 - 0.5 K/uL   Basophils Relative 0 %   Basophils Absolute 0.0 0.0 - 0.1 K/uL   Immature Granulocytes 1 %   Abs Immature Granulocytes 0.07 0.00 - 0.07 K/uL    Comment: Performed at [facility], [address], [place], [place] [address]  Basic metabolic panel     Status: Abnormal   Collection Time: 10/20/23  3:07 PM  Result Value Ref Range   Sodium 137 135 - 145 mmol/L   Potassium 4.4 3.5 - 5.1 mmol/L   Chloride 105 98 - 111 mmol/L   CO2 20 (L) 22 - 32 mmol/L   Glucose, Bld 129 (H) 70 - 99 mg/dL    Comment: Glucose reference range applies only to samples taken after fasting for at least 8 hours.   BUN 29  (H) 8 - 23 mg/dL   Creatinine, Ser 8.30 (H) 0.44 - 1.00 mg/dL   Calcium  8.4 (L) 8.9 - 10.3 mg/dL   GFR, Estimated 30 (L) >60 mL/min    Comment: (NOTE) Calculated using the CKD-EPI Creatinine Equation (2021)    Anion gap 12 5 - 15    Comment: Performed at [facility], [address] [place], [place] [address]  Type and screen [facility]     Status: None (Preliminary result)   Collection Time: 10/20/23  3:07 PM  Result Value Ref Range   ABO/RH(D) O POS    Antibody Screen NEG    Sample Expiration 10/23/2023,2359    Unit Number [id number]    Blood Component Type RBC LR PHER1    Unit division 00    Status of Unit ISSUED    Transfusion Status OK TO TRANSFUSE    Crossmatch Result      Compatible Performed at [facility], [address], [place], [place] [address]   Reticulocytes     Status: Abnormal   Collection Time: 10/20/23  3:07 PM  Result Value Ref Range   Retic Ct Pct 5.3 (H) 0.4 - 3.1 %   RBC. 2.15 (L) 3.87 - 5.11 MIL/uL   Retic Count, Absolute 112.9 19.0 - 186.0 K/uL   Immature Retic Fract 33.8 (H) 2.3 - 15.9 %    Comment: Performed at [facility], [address], [place], [place] [address]  Brain natriuretic peptide     Status: Abnormal   Collection Time: 10/20/23  4:05 PM  Result Value Ref Range   B Natriuretic Peptide 228.0 (H) 0.0 - 100.0 pg/mL    Comment: Performed at [facility], [address] [place], [place] [address]  Prepare RBC (crossmatch)     Status: None   Collection Time: 10/20/23  4:05 PM  Result Value Ref Range   Order Confirmation      ORDER PROCESSED BY BLOOD BANK Performed at [facility], [address] [place], [place] [address]    No results found.  Pending Labs Unresulted Labs (From admission, onward)     Start     Ordered   10/20/23 1453  Vitamin B12  (Anemia Panel (PNL))  Once,   URGENT        10/20/23 1452   10/20/23 1453  Folate  (Anemia Panel (PNL))  Once,   URGENT        10/20/23  1452   10/20/23 1453  Iron and TIBC   (Anemia Panel (PNL))  Once,   URGENT        10/20/23 1452   10/20/23 1453  Ferritin  (Anemia Panel (PNL))  Once,   URGENT        10/20/23 1452            Vitals/Pain Today's Vitals   10/20/23 1631 10/20/23 1631 10/20/23 1645 10/20/23 1701  BP: 128/68  120/67 120/67  Pulse: 73  72 72  Resp: 18  17 17   Temp:  97.8 F (36.6 C)  97.8 F (36.6 C)  TempSrc:  Oral  Oral  SpO2: 95%  96%   Weight:      PainSc:        Isolation Precautions No active isolations  Medications Medications  pantoprazole  (PROTONIX ) injection 80 mg (has no administration in time range)  furosemide (LASIX) injection 40 mg (40 mg Intravenous Given 10/20/23 1637)  0.9 %  sodium chloride  infusion (Manually program via Guardrails IV Fluids) ( Intravenous New Bag/Given 10/20/23 1641)    Mobility walks with device     Focused Assessments GCS 15   R Recommendations: See Admitting Provider Note  Report given to:   Additional Notes: A&Ox3, pleasant, Dentures, swelling to bilateral legs, RA, Incontinent,

## 2023-10-20 NOTE — ED Notes (Signed)
 Report given to the ICU nurse at this time.

## 2023-10-20 NOTE — ED Provider Notes (Signed)
 [place] EMERGENCY DEPARTMENT AT [facility] Provider Note  CSN: [id number] Arrival date & time: 10/20/23 1435  Chief Complaint(s) Abnormal Lab  HPI Maria Pace is a 83 y.o. female history of bipolar disorder, schizophrenia, diabetes, CHF presenting to the emergency department with abnormal lab.  Patient reports that she is here because she was sent from where she lives.  She is not quite sure why she was sent here.  She says that she overall feels fine but sometimes when she stands up she feels weak in the knees.  Has not noticed any blood in her stool or black stool but reports she takes iron.  She thinks that her test may be off because I do not eat the right foods.  She denies any abdominal pain, nausea or vomiting.  Patient denies any other complaints   Past Medical History Past Medical History:  Diagnosis Date   Bipolar 1 disorder (HCC)    Essential hypertension    GERD (gastroesophageal reflux disease)    Hyperlipidemia    Irregular heart beat    Schizophrenia (HCC)    Type 2 diabetes mellitus (HCC)    Patient Active Problem List   Diagnosis Date Noted   Acute blood loss anemia 10/20/2023   Current use of long term anticoagulation 09/01/2023   PAF (paroxysmal atrial fibrillation) (HCC) 09/01/2023   Sepsis due to COVID-19 (HCC) 07/12/2022   Chronic combined systolic and diastolic congestive heart failure (HCC)    ARF (acute renal failure) (HCC) 10/04/2019   Elevated CK 10/03/2019   SVT (supraventricular tachycardia) (HCC) 10/03/2019   GERD (gastroesophageal reflux disease)    Schizophrenia (HCC) 11/15/2017   Hypotension 02/20/2017   Severe dehydration 02/20/2017   AKI (acute kidney injury) (HCC) 02/20/2017   Constipation 02/20/2017   Lactic acidosis 02/20/2017   Urinary retention 11/10/2016   Hypertension 11/10/2016   Type 2 diabetes mellitus (HCC) 11/10/2016   Hyperlipidemia 11/10/2016   Hyponatremia 11/10/2016   SBO (small bowel obstruction) (HCC)  11/03/2015   Home Medication(s) Prior to Admission medications   Medication Sig Start Date End Date Taking? Authorizing Provider  albuterol  (VENTOLIN  HFA) 108 (90 Base) MCG/ACT inhaler Inhale 1 puff into the lungs every 6 (six) hours as needed. 06/29/20   [provider]  amiodarone  (PACERONE ) 200 MG tablet Take 1 tablet (200 mg total) by mouth 2 (two) times daily for 21 days, THEN 1 tablet (200 mg total) daily. 11/02/22 11/23/23  Mallipeddi, Vishnu P, MD  apixaban  (ELIQUIS ) 5 MG TABS tablet Take 1 tablet (5 mg total) by mouth 2 (two) times daily. 10/10/19   Evonnie Lenis, MD  ascorbic acid  (VITAMIN C ) 500 MG tablet Take 500 mg by mouth daily.    [provider]  atorvastatin  (LIPITOR) 20 MG tablet Take 20 mg by mouth daily. 10/05/20   [provider]  Camphor-Eucalyptus-Menthol (VICKS VAPORUB) 4.7-1.2-2.6 % OINT Apply 1 application topically daily. Apply once to toenails daily    [provider]  cholecalciferol (VITAMIN D3) 10 MCG (400 UNIT) TABS tablet Take 1,000 Units by mouth daily.    [provider]  divalproex  (DEPAKOTE  ER) 250 MG 24 hr tablet Take 1 tablet (250 mg total) by mouth daily. 03/03/23 07/01/23  Vickey Mettle, MD  docusate sodium  (COLACE) 100 MG capsule Take 200 mg by mouth daily.    [provider]  FEROSUL 325 (65 Fe) MG tablet Take 325 mg by mouth 2 (two) times daily with a meal. 10/05/20   [provider]  gabapentin  (NEURONTIN ) 400 MG capsule Take 400 mg by mouth at bedtime.    [provider]  latanoprost  (XALATAN ) 0.005 % ophthalmic solution Place 1 drop into both eyes at bedtime.    [provider]  loratadine  (CLARITIN ) 10 MG tablet Take 10 mg by mouth daily.    [provider]  metFORMIN (GLUCOPHAGE) 500 MG tablet Take 500 mg by mouth 2 (two) times daily with a meal.    [provider]  metoprolol  tartrate (LOPRESSOR ) 25 MG tablet Take 1 tablet (25 mg total) by mouth 2 (two) times  daily. 10/10/19   Evonnie Lenis, MD  Multiple Vitamin (MULTIVITAMIN) tablet Take 1 tablet by mouth daily.    [provider]  NYAMYC  powder Apply 1 Application topically 3 (three) times daily. 06/28/22   [provider]  OLANZapine  (ZYPREXA ) 20 MG tablet Take 1 tablet (20 mg total) by mouth at bedtime. Total of 25 mg at night. Take along with 20 mg tab 04/05/23 08/03/23  Vickey Mettle, MD  OLANZapine  (ZYPREXA ) 5 MG tablet Take 1 tablet (5 mg total) by mouth at bedtime. Total of 25 mg at night. Take along with 20 mg tab 04/05/23 08/03/23  Vickey Mettle, MD  pantoprazole  (PROTONIX ) 40 MG tablet Take 40 mg by mouth daily.    [provider]  polyethylene glycol powder (GLYCOLAX /MIRALAX ) powder Take 17 g by mouth daily as needed for mild constipation or moderate constipation.    [provider]  sacubitril -valsartan  (ENTRESTO ) 24-26 MG Take 1 tablet by mouth 2 (two) times daily. 11/10/22   Mallipeddi, Vishnu P, MD  senna-docusate (SENOKOT-S) 8.6-50 MG tablet Take 2 tablets by mouth at bedtime. 02/23/17   Antoinette Doe, MD  spironolactone  (ALDACTONE ) 25 MG tablet Take 0.5 tablets (12.5 mg total) by mouth daily. 01/20/23 04/20/23  Mallipeddi, Vishnu P, MD  vitamin B-12 (CYANOCOBALAMIN ) 500 MCG tablet Take 1 tablet (500 mcg total) by mouth daily. 10/10/19   Evonnie Lenis, MD  zinc  sulfate 220 (50 Zn) MG capsule Take 220 mg by mouth daily.    [provider]                                                                                                                                    Past Surgical History Past Surgical History:  Procedure Laterality Date   YAG LASER APPLICATION Left 01/06/2015   Procedure: YAG LASER APPLICATION;  Surgeon: Oneil Platts, MD;  Location: [facility];  Service: Ophthalmology;  Laterality: Left;   Family History Family History  Problem Relation Age of Onset   Hypertension Father    Diabetes type II Father     Social History Social History    Tobacco Use   Smoking status: Never   Smokeless tobacco: Never  Vaping Use   Vaping status: Never Used  Substance Use Topics   Alcohol  use: No   Drug use: No   Allergies  Haloperidol lactate  Review of Systems Review of Systems  All other systems reviewed and are negative.   Physical Exam Vital Signs  I have reviewed the triage vital signs BP 120/67   Pulse 72   Temp 97.8 F (36.6 C) (Oral)   Resp 17   Wt 88 kg   SpO2 96%   BMI 37.89 kg/m  Physical Exam Vitals and nursing note reviewed.  Constitutional:      General: She is not in acute distress.    Appearance: She is well-developed.  HENT:     Head: Normocephalic and atraumatic.     Mouth/Throat:     Mouth: Mucous membranes are moist.  Eyes:     Pupils: Pupils are equal, round, and reactive to light.  Cardiovascular:     Rate and Rhythm: Normal rate and regular rhythm.     Heart sounds: No murmur heard. Pulmonary:     Effort: Pulmonary effort is normal. No respiratory distress.     Breath sounds: Normal breath sounds.  Abdominal:     General: Abdomen is flat.     Palpations: Abdomen is soft.     Tenderness: There is no abdominal tenderness.  Genitourinary:    Comments: Patient refused Musculoskeletal:        General: No tenderness.     Right lower leg: Edema present.     Left lower leg: Edema present.  Skin:    General: Skin is warm and dry.  Neurological:     General: No focal deficit present.     Mental Status: She is alert. Mental status is at baseline.  Psychiatric:        Mood and Affect: Mood normal.        Behavior: Behavior normal.     ED Results and Treatments Labs (all labs ordered are listed, but only abnormal results are displayed) Labs Reviewed  CBC WITH DIFFERENTIAL/PLATELET - Abnormal; Notable for the following components:      Result Value   RBC 2.15 (*)    Hemoglobin 6.6 (*)    HCT 21.1 (*)    RDW 18.1 (*)    nRBC 0.6 (*)    All other components within normal limits   BASIC METABOLIC PANEL WITH GFR - Abnormal; Notable for the following components:   CO2 20 (*)    Glucose, Bld 129 (*)    BUN 29 (*)    Creatinine, Ser 1.69 (*)    Calcium  8.4 (*)    GFR, Estimated 30 (*)    All other components within normal limits  RETICULOCYTES - Abnormal; Notable for the following components:   Retic Ct Pct 5.3 (*)    RBC. 2.15 (*)    Immature Retic Fract 33.8 (*)    All other components within normal limits  BRAIN NATRIURETIC PEPTIDE - Abnormal; Notable for the following components:   B Natriuretic Peptide 228.0 (*)    All other components within normal limits  VITAMIN B12  FOLATE  IRON AND TIBC  FERRITIN  POC OCCULT BLOOD, ED  TYPE AND SCREEN  ABO/RH  PREPARE RBC (CROSSMATCH)  Radiology No results found.  Pertinent labs & imaging results that were available during my care of the patient were reviewed by me and considered in my medical decision making (see MDM for details).  Medications Ordered in ED Medications  pantoprazole  (PROTONIX ) injection 80 mg (has no administration in time range)  furosemide (LASIX) injection 40 mg (40 mg Intravenous Given 10/20/23 1637)  0.9 %  sodium chloride  infusion (Manually program via Guardrails IV Fluids) ( Intravenous New Bag/Given 10/20/23 1641)                                                                                                                                     Procedures .Critical Care  Performed by: Francesca Elsie CROME, MD Authorized by: Francesca Elsie CROME, MD   Critical care provider statement:    Critical care time (minutes):  30   Critical care was necessary to treat or prevent imminent or life-threatening deterioration of the following conditions:  Circulatory failure   Critical care was time spent personally by me on the following activities:  Development of treatment plan  with patient or surrogate, discussions with consultants, evaluation of patient's response to treatment, examination of patient, ordering and review of laboratory studies, ordering and review of radiographic studies, ordering and performing treatments and interventions, pulse oximetry, re-evaluation of patient's condition and review of old charts   Care discussed with: admitting provider     (including critical care time)  Medical Decision Making / ED Course   MDM:  83 year old presenting to the emergency department abnormal lab.  Patient is overall well-appearing, vital signs reassuring.  No hypotension or tachycardia.  Does have some lower extremity edema with history of CHF.  Unclear cause of anemia, does seem significantly worse than prior.  Patient does have A-fib but is on chronic Eliquis , concern for possible GI source.  Denies other source.  Patient is refusing to have rectal exam performed but states she will try to give us  a stool sample.  She has a listed blood product refusal in the chart however today the patient reports she is very much willing to have a blood transfusion.  Patient will need to be admitted given unclear cause of anemia.  Given leg swelling will give dose of Lasix  Clinical Course as of 10/20/23 1703  Fri Oct 20, 2023  1701 Discussed with hospitalist Dr. Evonnie who will admit patient, request GI consult.  Discussed with GI, reports that it should be okay for patient to stay here, recommends PPI and clear liquid diet and they will consult. [WS]    Clinical Course User Index [WS] Francesca, Elsie CROME, MD     Additional history obtained: -Additional history obtained from ems -External records from outside source obtained and reviewed including: Chart review including previous notes, labs, imaging, consultation notes including prior hgb level    Lab Tests: -I ordered, reviewed, and interpreted labs.   The pertinent  results include:   Labs Reviewed  CBC WITH  DIFFERENTIAL/PLATELET - Abnormal; Notable for the following components:      Result Value   RBC 2.15 (*)    Hemoglobin 6.6 (*)    HCT 21.1 (*)    RDW 18.1 (*)    nRBC 0.6 (*)    All other components within normal limits  BASIC METABOLIC PANEL WITH GFR - Abnormal; Notable for the following components:   CO2 20 (*)    Glucose, Bld 129 (*)    BUN 29 (*)    Creatinine, Ser 1.69 (*)    Calcium  8.4 (*)    GFR, Estimated 30 (*)    All other components within normal limits  RETICULOCYTES - Abnormal; Notable for the following components:   Retic Ct Pct 5.3 (*)    RBC. 2.15 (*)    Immature Retic Fract 33.8 (*)    All other components within normal limits  BRAIN NATRIURETIC PEPTIDE - Abnormal; Notable for the following components:   B Natriuretic Peptide 228.0 (*)    All other components within normal limits  VITAMIN B12  FOLATE  IRON AND TIBC  FERRITIN  POC OCCULT BLOOD, ED  TYPE AND SCREEN  ABO/RH  PREPARE RBC (CROSSMATCH)    Notable for mild anemia, elevated BNP    Imaging Studies ordered: I ordered imaging studies including CXR On my interpretation imaging demonstrates no acute process I independently visualized and interpreted imaging. I agree with the radiologist interpretation   Medicines ordered and prescription drug management: Meds ordered this encounter  Medications   furosemide (LASIX) injection 40 mg   0.9 %  sodium chloride  infusion (Manually program via Guardrails IV Fluids)   pantoprazole  (PROTONIX ) injection 80 mg    -I have reviewed the patients home medicines and have made adjustments as needed  Social Determinants of Health:  Diagnosis or treatment significantly limited by social determinants of health: obesity   Reevaluation: After the interventions noted above, I reevaluated the patient and found that their symptoms have improved  Co morbidities that complicate the patient evaluation  Past Medical History:  Diagnosis Date   Bipolar 1 disorder  (HCC)    Essential hypertension    GERD (gastroesophageal reflux disease)    Hyperlipidemia    Irregular heart beat    Schizophrenia (HCC)    Type 2 diabetes mellitus (HCC)       Dispostion: Disposition decision including need for hospitalization was considered, and patient admitted to the hospital.    Final Clinical Impression(s) / ED Diagnoses Final diagnoses:  Symptomatic anemia     This chart was dictated using voice recognition software.  Despite best efforts to proofread,  errors can occur which can change the documentation meaning.    Francesca Elsie CROME, MD 10/20/23 [phone]

## 2023-10-20 NOTE — H&P (Signed)
 History and Physical    Patient: Maria Pace DOB: Dec 12, 1940 DOA: 10/20/2023 DOS: the patient was seen and examined on 10/20/2023 PCP: Wilmon Penton, NP  Patient coming from: Home  Chief Complaint:  Chief Complaint  Patient presents with   Abnormal Lab   HPI: Maria Pace is a 83 y/o female   with medical history significant of bipolar 1 disorder, schizophrenia, essential hypertension, GERD, hyperlipidemia, history of irregular heartbeat, type 2 diabetes mellitus, PAF on apixaban , HFrEF (EF 30-35%), HTN presenting with abnormal labs. The patient is difficult historian, but able to provide some basic history.  She states that she had some labs drawn on 10/19/2023.  She thinks it may have been ordered by her primary care provider.  She was contacted to go to emergency department because of abnormal values, particularly low blood count. The patient has been complaining of generalized weakness and feeling weak in the knees for about a week.  She denies any dizziness or syncope.  She denies any fevers, chills, headache, chest pain, shortness breath, coughing, hemoptysis, nausea, vomiting, diarrhea.  There is no hematochezia. She states that she has been having melena for about a week.  She states that she did not think too much of it because she has been on iron.  She denies any abdominal pain.  She is not sure if she has been taking any NSAIDs recently. In the ED, the patient was afebrile and hemodynamically stable with oxygen saturation 97% on room air.  WBC 8.7, hemoglobin 6.6, platelets 226.  Sodium 137, potassium 4.4, bicarbonate 20, serum creatinine 1.69.  The patient deferred rectal exam in the ED. 1 unit PRBC was ordered. GI was consulted and will consult after admission.   Review of Systems: As mentioned in the history of present illness. All other systems reviewed and are negative. Past Medical History:  Diagnosis Date   Bipolar 1 disorder (HCC)    Essential  hypertension    GERD (gastroesophageal reflux disease)    Hyperlipidemia    Irregular heart beat    Schizophrenia (HCC)    Type 2 diabetes mellitus (HCC)    Past Surgical History:  Procedure Laterality Date   YAG LASER APPLICATION Left 01/06/2015   Procedure: YAG LASER APPLICATION;  Surgeon: Oneil Platts, MD;  Location: [facility];  Service: Ophthalmology;  Laterality: Left;   Social History:  reports that she has never smoked. She has never used smokeless tobacco. She reports that she does not drink alcohol  and does not use drugs.  Allergies  Allergen Reactions   Haloperidol Lactate Other (See Comments)    Couldn't urinate anymore    Family History  Problem Relation Age of Onset   Hypertension Father    Diabetes type II Father     Prior to Admission medications   Medication Sig Start Date End Date Taking? Authorizing Provider  albuterol  (VENTOLIN  HFA) 108 (90 Base) MCG/ACT inhaler Inhale 1 puff into the lungs every 6 (six) hours as needed. 06/29/20   [provider]  amiodarone  (PACERONE ) 200 MG tablet Take 1 tablet (200 mg total) by mouth 2 (two) times daily for 21 days, THEN 1 tablet (200 mg total) daily. 11/02/22 11/23/23  Mallipeddi, Vishnu P, MD  apixaban  (ELIQUIS ) 5 MG TABS tablet Take 1 tablet (5 mg total) by mouth 2 (two) times daily. 10/10/19   Evonnie Lenis, MD  ascorbic acid  (VITAMIN C ) 500 MG tablet Take 500 mg by mouth daily.    [provider]  atorvastatin  (  LIPITOR) 20 MG tablet Take 20 mg by mouth daily. 10/05/20   [provider]  Camphor-Eucalyptus-Menthol (VICKS VAPORUB) 4.7-1.2-2.6 % OINT Apply 1 application topically daily. Apply once to toenails daily    [provider]  cholecalciferol (VITAMIN D3) 10 MCG (400 UNIT) TABS tablet Take 1,000 Units by mouth daily.    [provider]  divalproex  (DEPAKOTE  ER) 250 MG 24 hr tablet Take 1 tablet (250 mg total) by mouth daily. 03/03/23 07/01/23  Vickey Mettle, MD  docusate sodium  (COLACE)  100 MG capsule Take 200 mg by mouth daily.    [provider]  FEROSUL 325 (65 Fe) MG tablet Take 325 mg by mouth 2 (two) times daily with a meal. 10/05/20   [provider]  gabapentin  (NEURONTIN ) 400 MG capsule Take 400 mg by mouth at bedtime.    [provider]  latanoprost  (XALATAN ) 0.005 % ophthalmic solution Place 1 drop into both eyes at bedtime.    [provider]  loratadine  (CLARITIN ) 10 MG tablet Take 10 mg by mouth daily.    [provider]  metFORMIN (GLUCOPHAGE) 500 MG tablet Take 500 mg by mouth 2 (two) times daily with a meal.    [provider]  metoprolol  tartrate (LOPRESSOR ) 25 MG tablet Take 1 tablet (25 mg total) by mouth 2 (two) times daily. 10/10/19   Evonnie Lenis, MD  Multiple Vitamin (MULTIVITAMIN) tablet Take 1 tablet by mouth daily.    [provider]  NYAMYC  powder Apply 1 Application topically 3 (three) times daily. 06/28/22   [provider]  OLANZapine  (ZYPREXA ) 20 MG tablet Take 1 tablet (20 mg total) by mouth at bedtime. Total of 25 mg at night. Take along with 20 mg tab 04/05/23 08/03/23  Vickey Mettle, MD  OLANZapine  (ZYPREXA ) 5 MG tablet Take 1 tablet (5 mg total) by mouth at bedtime. Total of 25 mg at night. Take along with 20 mg tab 04/05/23 08/03/23  Vickey Mettle, MD  pantoprazole  (PROTONIX ) 40 MG tablet Take 40 mg by mouth daily.    [provider]  polyethylene glycol powder (GLYCOLAX /MIRALAX ) powder Take 17 g by mouth daily as needed for mild constipation or moderate constipation.    [provider]  sacubitril -valsartan  (ENTRESTO ) 24-26 MG Take 1 tablet by mouth 2 (two) times daily. 11/10/22   Mallipeddi, Vishnu P, MD  senna-docusate (SENOKOT-S) 8.6-50 MG tablet Take 2 tablets by mouth at bedtime. 02/23/17   Antoinette Doe, MD  spironolactone  (ALDACTONE ) 25 MG tablet Take 0.5 tablets (12.5 mg total) by mouth daily. 01/20/23 04/20/23  Mallipeddi, Vishnu P, MD  vitamin B-12  (CYANOCOBALAMIN ) 500 MCG tablet Take 1 tablet (500 mcg total) by mouth daily. 10/10/19   Evonnie Lenis, MD  zinc  sulfate 220 (50 Zn) MG capsule Take 220 mg by mouth daily.    [provider]    Physical Exam: Vitals:   10/20/23 1645 10/20/23 1700 10/20/23 1701 10/20/23 1720  BP: 120/67 139/68 120/67 (!) 132/99  Pulse: 72 73 72 73  Resp: 17 20 17 18   Temp:   97.8 F (36.6 C) 97.8 F (36.6 C)  TempSrc:   Oral Oral  SpO2: 96% 97%  99%  Weight:       GENERAL:  A&O x 3, NAD, well developed, cooperative, follows commands HEENT: [place]/AT, No thrush, No icterus, No oral ulcers Neck:  No neck mass, No meningismus, soft, supple CV: RRR, no S3, no S4, no rub, no JVD Lungs:  CTA, no wheeze, no  rhonchi, good air movement Abd: soft/NT +BS, nondistended Ext: 1 + LE edema, no lymphangitis, no cyanosis, no rashes Neuro:  CN II-XII intact, strength 4/5 in RUE, RLE, strength 4/5 LUE, LLE; sensation intact bilateral; no dysmetria; babinski equivocal  Data Reviewed: Data reviewed from above  Assessment and Plan: Symptomatic anemia/acute blood loss anemia -Patient presented with hemoglobin 6.6 - Baseline hemoglobin~10 - Patient endorses intermittent melena - Clear liquid diet - Start pantoprazole  - Hold apixaban  - I have discussed with GI, Dr. Lethia to consult  Chronic HFrEF - 09/26/2023 echo EF 30-35%, normal RVF, moderate MR - Patient has some signs of fluid overload - Furosemide 40 mg IV given in the ED - Review of the [facility] record shows that the patient is not on any furosemide at home - Continue Entresto , spironolactone , metoprolol  tartrate  Paroxysmal atrial fibrillation - Currently in sinus rhythm - Holding apixaban  - Continue metoprolol  - Continue amiodarone  -  event monitor from 5/24 showed 408 SVT runs, some of them are atrial fibrillation. Event monitor from February 2025 did not reveal any evidence of atrial fibrillation but significant PAC burden was noted although  she continues to be asymptomatic   Schizophrenia, unspecified type - Continue olanzapine  - Continue Depakote   CKD stage IIIb - Baseline creatinine 1.3-1.5 - Monitor BMP  Diabetes mellitus type 2, controlled - Check hemoglobin A1c - NovoLog  sliding scale - Holding metformin  Mixed hyperlipidemia - Continue statin    Advance Care Planning: FULL  Consults: GI  Family Communication: none present  Severity of Illness: The appropriate patient status for this patient is OBSERVATION. Observation status is judged to be reasonable and necessary in order to provide the required intensity of service to ensure the patient's safety. The patient's presenting symptoms, physical exam findings, and initial radiographic and laboratory data in the context of their medical condition is felt to place them at decreased risk for further clinical deterioration. Furthermore, it is anticipated that the patient will be medically stable for discharge from the hospital within 2 midnights of admission.   Author: Alm Schneider, MD 10/20/2023 5:44 PM  For on call review [email or web address].

## 2023-10-20 NOTE — Hospital Course (Addendum)
 83 y/o female   with medical history significant of bipolar 1 disorder, schizophrenia, essential hypertension, GERD, hyperlipidemia, history of irregular heartbeat, type 2 diabetes mellitus, PAF on apixaban , HFrEF (EF 30-35%), HTN presenting with abnormal labs. The patient is difficult historian, but able to provide some basic history.  She states that she had some labs drawn on 10/19/2023.  She thinks it may have been ordered by her primary care provider.  She was contacted to go to emergency department because of abnormal values, particularly low blood count. The patient has been complaining of generalized weakness and feeling weak in the knees for about a week.  She denies any dizziness or syncope.  She denies any fevers, chills, headache, chest pain, shortness breath, coughing, hemoptysis, nausea, vomiting, diarrhea.  There is no hematochezia. She states that she has been having melena for about a week.  She states that she did not think too much of it because she has been on iron.  She denies any abdominal pain.  She is not sure if she has been taking any NSAIDs recently. In the ED, the patient was afebrile and hemodynamically stable with oxygen saturation 97% on room air.  WBC 8.7, hemoglobin 6.6, platelets 226.  Sodium 137, potassium 4.4, bicarbonate 20, serum creatinine 1.69.  The patient deferred rectal exam in the ED. 1 unit PRBC was ordered. GI was consulted and will consult after admission.  GI initially plan for EGD and colonoscopy.  The patient initially agreed and signed consent.  The patient subsequently had some difficulty tolerating the colonoscopy preparation.  Out of her frustration, the patient decided that she did not want any further interventions and refused further EGD or colonoscopy.  The risks, benefits, and alternatives were discussed with the patient.  It was discussed with the patient that it was not safe to restart apixaban  without further clarifying source of bleeding.   Therefore, the patient will be discharged home with aspirin  in lieu of apixaban .  In the morning 10/24/23, patient decided she was agreeable to EGD.  GI plans for EGD on 10/25/23.  She continues to refuse colonoscopy.

## 2023-10-21 DIAGNOSIS — D62 Acute posthemorrhagic anemia: Secondary | ICD-10-CM | POA: Diagnosis not present

## 2023-10-21 DIAGNOSIS — Z7901 Long term (current) use of anticoagulants: Secondary | ICD-10-CM | POA: Diagnosis not present

## 2023-10-21 DIAGNOSIS — D649 Anemia, unspecified: Secondary | ICD-10-CM | POA: Diagnosis not present

## 2023-10-21 DIAGNOSIS — I5022 Chronic systolic (congestive) heart failure: Secondary | ICD-10-CM | POA: Diagnosis not present

## 2023-10-21 DIAGNOSIS — K921 Melena: Secondary | ICD-10-CM

## 2023-10-21 DIAGNOSIS — I480 Paroxysmal atrial fibrillation: Secondary | ICD-10-CM | POA: Diagnosis not present

## 2023-10-21 LAB — BPAM RBC
Blood Product Expiration Date: 202508202359
ISSUE DATE / TIME: 202507251653
Unit Type and Rh: 5100

## 2023-10-21 LAB — BASIC METABOLIC PANEL WITH GFR
Anion gap: 11 (ref 5–15)
BUN: 28 mg/dL — ABNORMAL HIGH (ref 8–23)
CO2: 24 mmol/L (ref 22–32)
Calcium: 8.7 mg/dL — ABNORMAL LOW (ref 8.9–10.3)
Chloride: 106 mmol/L (ref 98–111)
Creatinine, Ser: 1.54 mg/dL — ABNORMAL HIGH (ref 0.44–1.00)
GFR, Estimated: 34 mL/min — ABNORMAL LOW (ref >60)
Glucose, Bld: 72 mg/dL (ref 70–99)
Potassium: 4.1 mmol/L (ref 3.5–5.1)
Sodium: 141 mmol/L (ref 135–145)

## 2023-10-21 LAB — TYPE AND SCREEN
ABO/RH(D): O POS
Antibody Screen: NEGATIVE
Unit division: 0

## 2023-10-21 LAB — CBC
HCT: 27.7 % — ABNORMAL LOW (ref 36.0–46.0)
Hemoglobin: 8.7 g/dL — ABNORMAL LOW (ref 12.0–15.0)
MCH: 30.5 pg (ref 26.0–34.0)
MCHC: 31.4 g/dL (ref 30.0–36.0)
MCV: 97.2 fL (ref 80.0–100.0)
Platelets: 187 K/uL (ref 150–400)
RBC: 2.85 MIL/uL — ABNORMAL LOW (ref 3.87–5.11)
RDW: 18.9 % — ABNORMAL HIGH (ref 11.5–15.5)
WBC: 7.9 K/uL (ref 4.0–10.5)
nRBC: 0 % (ref 0.0–0.2)

## 2023-10-21 LAB — GLUCOSE, CAPILLARY
Glucose-Capillary: 66 mg/dL — ABNORMAL LOW (ref 70–99)
Glucose-Capillary: 102 mg/dL — ABNORMAL HIGH (ref 70–99)
Glucose-Capillary: 130 mg/dL — ABNORMAL HIGH (ref 70–99)
Glucose-Capillary: 74 mg/dL (ref 70–99)
Glucose-Capillary: 102 mg/dL — ABNORMAL HIGH (ref 70–99)

## 2023-10-21 LAB — MRSA NEXT GEN BY PCR, NASAL: MRSA by PCR Next Gen: NOT DETECTED

## 2023-10-21 LAB — HEMOGLOBIN A1C
Hgb A1c MFr Bld: 5.2 % (ref 4.8–5.6)
Mean Plasma Glucose: 102.54 mg/dL

## 2023-10-21 MED ORDER — INSULIN ASPART 100 UNIT/ML IJ SOLN: 0.0000 [IU] | Freq: Three times a day (TID) | INTRAMUSCULAR | Status: DC

## 2023-10-21 MED ORDER — ORAL CARE MOUTH RINSE: 15.0000 mL | OROMUCOSAL | Status: DC | PRN

## 2023-10-21 MED ORDER — SODIUM CHLORIDE 0.9 % IV BOLUS: 1000.0000 mL | Freq: Once | INTRAVENOUS | Status: DC

## 2023-10-21 MED ORDER — INSULIN ASPART 100 UNIT/ML IJ SOLN: 0.0000 [IU] | Freq: Every day | INTRAMUSCULAR | Status: DC

## 2023-10-21 MED ORDER — INSULIN ASPART 100 UNIT/ML IJ SOLN
0.0000 [IU] | Freq: Three times a day (TID) | INTRAMUSCULAR | Status: DC
  Administered 2023-10-21: 1 [IU] via SUBCUTANEOUS
  Administered 2023-10-22: 2 [IU] via SUBCUTANEOUS

## 2023-10-21 NOTE — Consult Note (Signed)
 Consulting  Provider: Dr. Francesca Primary Care Physician:  Gammon, Chrystal, NP Primary Gastroenterologist:  Dr. Cindie (previously unassigned)  Reason for Consultation: Acute on chronic anemia, melena  HPI:  Maria Pace is a 83 y.o. female with a past medical history of bipolar disorder, schizophrenia, GERD, dyslipidemia, paroxysmal atrial fibrillation on apixaban , HFrEF (EF 30-35%), HTN, who presented to [facility], ER yesterday afternoon with abnormal blood work.  Patient currently resides at [facility] facility.  Patient is a difficult historian given her underlying psychiatric disorders.  She was reportedly contacted by her PCP and told to go to the emergency room for further evaluation due to worsening anemia.  Does note that she has been having some generalized weakness/fatigue.  Also reports to me that she has been having dark stools.  She did not think much of this as she is chronically on oral iron.  Denies any hematochezia.  She is unsure about NSAID use.  In the ER found to have hemoglobin of 6.6, has improved to 8.7 today after infusion of PRBCs.  I discussed with RN, no bowel movements today.  Hemoccult pending.  Patient is unsure if she has ever had a colonoscopy or upper endoscopy.  Does note some mild epigastric abdominal pain which is intermittent.  Past Medical History:  Diagnosis Date   Bipolar 1 disorder (HCC)    Essential hypertension    GERD (gastroesophageal reflux disease)    Hyperlipidemia    Irregular heart beat    Schizophrenia (HCC)    Type 2 diabetes mellitus (HCC)     Past Surgical History:  Procedure Laterality Date   YAG LASER APPLICATION Left 01/06/2015   Procedure: YAG LASER APPLICATION;  Surgeon: Oneil Platts, MD;  Location: [facility];  Service: Ophthalmology;  Laterality: Left;    Prior to Admission medications   Medication Sig Start Date End Date Taking? Authorizing Provider  albuterol  (VENTOLIN  HFA) 108 (90 Base) MCG/ACT  inhaler Inhale 1 puff into the lungs every 6 (six) hours as needed. 06/29/20   [provider]  amiodarone  (PACERONE ) 200 MG tablet Take 1 tablet (200 mg total) by mouth 2 (two) times daily for 21 days, THEN 1 tablet (200 mg total) daily. 11/02/22 11/23/23  Mallipeddi, Vishnu P, MD  apixaban  (ELIQUIS ) 5 MG TABS tablet Take 1 tablet (5 mg total) by mouth 2 (two) times daily. 10/10/19   Evonnie Lenis, MD  ascorbic acid  (VITAMIN C ) 500 MG tablet Take 500 mg by mouth daily.    [provider]  atorvastatin  (LIPITOR) 20 MG tablet Take 20 mg by mouth daily. 10/05/20   [provider]  Camphor-Eucalyptus-Menthol (VICKS VAPORUB) 4.7-1.2-2.6 % OINT Apply 1 application topically daily. Apply once to toenails daily    [provider]  cholecalciferol  (VITAMIN D3) 10 MCG (400 UNIT) TABS tablet Take 1,000 Units by mouth daily.    [provider]  divalproex  (DEPAKOTE  ER) 250 MG 24 hr tablet Take 1 tablet (250 mg total) by mouth daily. 03/03/23 07/01/23  Vickey Mettle, MD  docusate sodium  (COLACE) 100 MG capsule Take 200 mg by mouth daily.    [provider]  FEROSUL 325 (65 Fe) MG tablet Take 325 mg by mouth 2 (two) times daily with a meal. 10/05/20   [provider]  gabapentin  (NEURONTIN ) 400 MG capsule Take 400 mg by mouth at bedtime.    [provider]  latanoprost  (XALATAN ) 0.005 % ophthalmic solution Place 1 drop into both eyes at bedtime.  [provider]  loratadine  (CLARITIN ) 10 MG tablet Take 10 mg by mouth daily.    [provider]  metFORMIN (GLUCOPHAGE) 500 MG tablet Take 500 mg by mouth 2 (two) times daily with a meal.    [provider]  metoprolol  tartrate (LOPRESSOR ) 25 MG tablet Take 1 tablet (25 mg total) by mouth 2 (two) times daily. 10/10/19   Evonnie Lenis, MD  Multiple Vitamin (MULTIVITAMIN) tablet Take 1 tablet by mouth daily.    [provider]  NYAMYC  powder Apply 1 Application topically 3 (three)  times daily. 06/28/22   [provider]  OLANZapine  (ZYPREXA ) 20 MG tablet Take 1 tablet (20 mg total) by mouth at bedtime. Total of 25 mg at night. Take along with 20 mg tab 04/05/23 08/03/23  Vickey Mettle, MD  OLANZapine  (ZYPREXA ) 5 MG tablet Take 1 tablet (5 mg total) by mouth at bedtime. Total of 25 mg at night. Take along with 20 mg tab 04/05/23 08/03/23  Vickey Mettle, MD  pantoprazole  (PROTONIX ) 40 MG tablet Take 40 mg by mouth daily.    [provider]  polyethylene glycol powder (GLYCOLAX /MIRALAX ) powder Take 17 g by mouth daily as needed for mild constipation or moderate constipation.    [provider]  sacubitril -valsartan  (ENTRESTO ) 24-26 MG Take 1 tablet by mouth 2 (two) times daily. 11/10/22   Mallipeddi, Vishnu P, MD  senna-docusate (SENOKOT-S) 8.6-50 MG tablet Take 2 tablets by mouth at bedtime. 02/23/17   Antoinette Doe, MD  spironolactone  (ALDACTONE ) 25 MG tablet Take 0.5 tablets (12.5 mg total) by mouth daily. 01/20/23 04/20/23  Mallipeddi, Vishnu P, MD  vitamin B-12 (CYANOCOBALAMIN ) 500 MCG tablet Take 1 tablet (500 mcg total) by mouth daily. 10/10/19   Evonnie Lenis, MD  zinc  sulfate 220 (50 Zn) MG capsule Take 220 mg by mouth daily.    [provider]    Current Facility-Administered Medications  Medication Dose Route Frequency Provider Last Rate Last Admin   acetaminophen  (TYLENOL ) tablet 650 mg  650 mg Oral Q6H PRN Tat, David, MD       Or   acetaminophen  (TYLENOL ) suppository 650 mg  650 mg Rectal Q6H PRN Tat, Lenis, MD       amiodarone  (PACERONE ) tablet 200 mg  200 mg Oral Daily Tat, David, MD   200 mg at 10/21/23 9075   ascorbic acid  (VITAMIN C ) tablet 500 mg  500 mg Oral Daily Tat, David, MD   500 mg at 10/21/23 9074   atorvastatin  (LIPITOR) tablet 20 mg  20 mg Oral Daily Tat, Lenis, MD   20 mg at 10/21/23 9075   Chlorhexidine  Gluconate Cloth 2 % PADS 6 each  6 each Topical Daily Tat, Lenis, MD   6 each at 10/21/23 0926   cholecalciferol  (VITAMIN  D3) 25 MCG (1000 UNIT) tablet 1,000 Units  1,000 Units Oral Daily Tat, David, MD   1,000 Units at 10/21/23 9074   divalproex  (DEPAKOTE  ER) 24 hr tablet 250 mg  250 mg Oral Daily Tat, David, MD   250 mg at 10/21/23 9074   ferrous sulfate  tablet 325 mg  325 mg Oral BID WC Evonnie Lenis, MD   325 mg at 10/21/23 0809   gabapentin  (NEURONTIN ) capsule 400 mg  400 mg Oral QHS Tat, David, MD   400 mg at 10/20/23 2219   insulin  aspart (novoLOG ) injection 0-5 Units  0-5 Units Subcutaneous QHS Tat, Lenis, MD       insulin  aspart (novoLOG ) injection 0-9 Units  0-9 Units Subcutaneous TID WC Tat, Alm, MD   1 Units at 10/21/23 1119   latanoprost  (XALATAN ) 0.005 % ophthalmic solution 1 drop  1 drop Both Eyes QHS Tat, Alm, MD   1 drop at 10/20/23 2325   loratadine  (CLARITIN ) tablet 10 mg  10 mg Oral Daily Tat, David, MD   10 mg at 10/21/23 9075   multivitamin with minerals tablet 1 tablet  1 tablet Oral Daily Tat, David, MD   1 tablet at 10/21/23 9074   OLANZapine  (ZYPREXA ) tablet 20 mg  20 mg Oral QHS Tat, David, MD   20 mg at 10/20/23 2220   OLANZapine  (ZYPREXA ) tablet 5 mg  5 mg Oral QHS Evonnie Alm, MD   5 mg at 10/20/23 2325   ondansetron  (ZOFRAN ) tablet 4 mg  4 mg Oral Q6H PRN Tat, Alm, MD       Or   ondansetron  (ZOFRAN ) injection 4 mg  4 mg Intravenous Q6H PRN Tat, Alm, MD       pantoprazole  (PROTONIX ) injection 40 mg  40 mg Intravenous Q12H Tat, David, MD   40 mg at 10/21/23 9073   pantoprazole  (PROTONIX ) injection 80 mg  80 mg Intravenous Once Maria Pace Elsie CROME, MD       polyethylene glycol powder (GLYCOLAX /MIRALAX ) container 17 g  17 g Oral Daily PRN Tat, Alm, MD       sacubitril -valsartan  (ENTRESTO ) 24-26 mg per tablet  1 tablet Oral BID Evonnie Alm, MD   1 tablet at 10/21/23 9074   senna-docusate (Senokot-S) tablet 2 tablet  2 tablet Oral QHS Tat, David, MD   2 tablet at 10/20/23 2220   sodium chloride  0.9 % bolus 1,000 mL  1,000 mL Intravenous Once Dena Charleston, MD   Held at 10/21/23 0536    vitamin B-12 (CYANOCOBALAMIN ) tablet 500 mcg  500 mcg Oral Daily Tat, Alm, MD   500 mcg at 10/21/23 9075    Allergies as of 10/20/2023 - Review Complete 10/20/2023  Allergen Reaction Noted   Haloperidol lactate Other (See Comments) 04/26/2016    Family History  Problem Relation Age of Onset   Hypertension Father    Diabetes type II Father     Social History   Socioeconomic History   Marital status: Single    Spouse name: Not on file   Number of children: Not on file   Years of education: Not on file   Highest education level: Not on file  Occupational History   Not on file  Tobacco Use   Smoking status: Never   Smokeless tobacco: Never  Vaping Use   Vaping status: Never Used  Substance and Sexual Activity   Alcohol  use: No   Drug use: No   Sexual activity: Never  Other Topics Concern   Not on file  Social History Narrative   Not on file   Social Drivers of Health   Financial Resource Strain: Not on file  Food Insecurity: No Food Insecurity (10/21/2023)   Hunger Vital Sign    Worried About Running Out of Food in the Last Year: Never true    Ran Out of Food in the Last Year: Never true  Transportation Needs: No Transportation Needs (10/21/2023)   PRAPARE - Administrator, Civil Service (Medical): No    Lack of Transportation (Non-Medical): No  Physical Activity: Not on file  Stress: Not on file  Social Connections: Unknown (10/21/2023)   Social Connection and Isolation Panel    Frequency of Communication  with Friends and Family: Patient unable to answer    Frequency of Social Gatherings with Friends and Family: Patient unable to answer    Attends Religious Services: Not on file    Active Member of Clubs or Organizations: Patient unable to answer    Attends Banker Meetings: Patient unable to answer    Marital Status: Patient unable to answer  Intimate Partner Violence: Not At Risk (10/21/2023)   Humiliation, Afraid, Rape, and Kick  questionnaire    Fear of Current or Ex-Partner: No    Emotionally Abused: No    Physically Abused: No    Sexually Abused: No    Review of Systems: General: Negative for anorexia, weight loss, fever, chills, fatigue, weakness. Eyes: Negative for vision changes.  ENT: Negative for hoarseness, difficulty swallowing , nasal congestion. CV: Negative for chest pain, angina, palpitations, dyspnea on exertion, peripheral edema.  Respiratory: Negative for dyspnea at rest, dyspnea on exertion, cough, sputum, wheezing.  GI: See history of present illness. GU:  Negative for dysuria, hematuria, urinary incontinence, urinary frequency, nocturnal urination.  MS: Negative for joint pain, low back pain.  Derm: Negative for rash or itching.  Neuro: Negative for weakness, abnormal sensation, seizure, frequent headaches, memory loss, confusion.  Psych: Negative for anxiety, depression Endo: Negative for unusual weight change.  Heme: Negative for bruising or bleeding. Allergy: Negative for rash or hives.  Physical Exam: Vital signs in last 24 hours: Temp:  [97.5 F (36.4 C)-98.4 F (36.9 C)] 97.8 F (36.6 C) (07/26 1107) Pulse Rate:  [57-84] 63 (07/26 0808) Resp:  [11-21] 12 (07/26 0808) BP: (89-141)/(35-99) 141/61 (07/26 0808) SpO2:  [94 %-100 %] 98 % (07/26 0808) FiO2 (%):  [21 %] 21 % (07/25 2026) Weight:  [88 kg] 88 kg (07/25 1449) Last BM Date :  (PTA) General:   Alert,  Well-developed, well-nourished, pleasant and cooperative in NAD Head:  Normocephalic and atraumatic. Eyes:  Sclera clear, no icterus.   Conjunctiva pink. Ears:  Normal auditory acuity. Nose:  No deformity, discharge,  or lesions. Mouth:  No deformity or lesions, dentition normal. Neck:  Supple; no masses or thyromegaly. Lungs:  Clear throughout to auscultation.   No wheezes, crackles, or rhonchi. No acute distress. Heart:  Regular rate and rhythm; no murmurs, clicks, rubs,  or gallops. Abdomen:  Soft, nontender and  nondistended. No masses, hepatosplenomegaly or hernias noted. Normal bowel sounds, without guarding, and without rebound.   Msk:  Symmetrical without gross deformities. Normal posture. Pulses:  Normal pulses noted. Extremities:  Without clubbing or edema. Neurologic:  Alert and  oriented x4;  grossly normal neurologically. Skin:  Intact without significant lesions or rashes. Cervical Nodes:  No significant cervical adenopathy. Psych:  Alert and cooperative. Normal mood and affect.  Intake/Output from previous day: 07/25 0701 - 07/26 0700 In: 500 [P.O.:500] Out: 2100 [Urine:2100] Intake/Output this shift: Total I/O In: -  Out: 150 [Urine:150]  Lab Results: Recent Labs    10/20/23 1507 10/20/23 2108 10/21/23 0444  WBC 8.7  --  7.9  HGB 6.6* 8.8* 8.7*  HCT 21.1* 27.8* 27.7*  PLT 240  --  187   BMET Recent Labs    10/20/23 1507 10/21/23 0444  NA 137 141  K 4.4 4.1  CL 105 106  CO2 20* 24  GLUCOSE 129* 72  BUN 29* 28*  CREATININE 1.69* 1.54*  CALCIUM  8.4* 8.7*   LFT No results for input(s): PROT, ALBUMIN, AST, ALT, ALKPHOS, BILITOT, BILIDIR, IBILI in the  last 72 hours. PT/INR No results for input(s): LABPROT, INR in the last 72 hours. Hepatitis Panel No results for input(s): HEPBSAG, HCVAB, HEPAIGM, HEPBIGM in the last 72 hours. C-Diff No results for input(s): CDIFFTOX in the last 72 hours.  Studies/Results: DG Chest Portable 1 View Result Date: 10/20/2023 CLINICAL DATA:  Weakness. EXAM: PORTABLE CHEST 1 VIEW COMPARISON:  07/12/2022. FINDINGS: Low lung volumes with bronchovascular crowding. Cardiomegaly. No focal consolidation, pleural effusion, or pneumothorax. No acute osseous abnormality. IMPRESSION: 1. Low lung volumes.  No acute cardiopulmonary findings. 2. Cardiomegaly. Electronically Signed   By: Harrietta Sherry M.D.   On: 10/20/2023 17:12    Assessment: *Acute on chronic anemia *Melena *Chronic anticoagulation with  apixaban   Plan: Discussed in depth with patient today.  She reports dark stools though chronically on oral iron therapy.  Not the best historian.  Hemoccult pending.  Refused rectal examination.  Discussed further evaluation with upper endoscopy and colonoscopy with patient especially in the setting of chronic anticoagulation.  She is adamantly against having both of these procedures.  In response she states God is on my side, I do not need no colonoscopy.  Difficult situation given patient's underlying psychiatric disorders.  We have tried to reach out to the facility for more information as far as healthcare power of attorney.  For now, we will continue supportive care.  Continue to monitor H&H and transfuse for less than 7.    Continue to hold apixaban .  Continue IV PPI twice daily.  Thank you for the consultation.  Carlin POUR. Maria [facility]ace, D.O. Gastroenterology and Hepatology [facility]    LOS: 0 days     10/21/2023, 11:33 AM

## 2023-10-21 NOTE — Plan of Care (Signed)
   Problem: Coping: Goal: Level of anxiety will decrease Outcome: Progressing   Problem: Pain Managment: Goal: General experience of comfort will improve and/or be controlled Outcome: Progressing

## 2023-10-21 NOTE — Progress Notes (Addendum)
 [phone] RN reached out to Lamar Dess MD regarding pt BP of 89/35 (54). MD ordered bolus, RN rechecked and resized cuff, BP 105/75 (81), bolus held at this time.

## 2023-10-21 NOTE — Care Management Obs Status (Signed)
 MEDICARE OBSERVATION STATUS NOTIFICATION   Patient Details  Name: Maria Pace MRN: [id number] Date of Birth: 1941/03/06   Medicare Observation Status Notification Given:  Yes    Nena LITTIE Coffee, RN 10/21/2023, 5:34 PM

## 2023-10-21 NOTE — Progress Notes (Signed)
   10/21/23 1735  TOC Brief Assessment  Insurance and Status Reviewed  Patient has primary care physician Yes  Home environment has been reviewed From [facility][phone])  Prior level of function: Assisted  Prior/Current Home Services No current home services  Social Drivers of Health Review SDOH reviewed no interventions necessary  Readmission risk has been reviewed Yes  Transition of care needs no transition of care needs at this time   Transition of Care Department [facility]) has reviewed patient and no TOC needs have been identified at this time. We will continue to monitor patient advancement through interdisciplinary progression rounds. If new patient transition needs arise, please place a TOC consult.

## 2023-10-21 NOTE — Progress Notes (Addendum)
 PROGRESS NOTE  BRIDGET Nordic FMW:[id number] DOB: 02-Mar-1941 DOA: 10/20/2023 PCP: Wilmon Penton, NP  Brief History:  83 y/o female   with medical history significant of bipolar 1 disorder, schizophrenia, essential hypertension, GERD, hyperlipidemia, history of irregular heartbeat, type 2 diabetes mellitus, PAF on apixaban , HFrEF (EF 30-35%), HTN presenting with abnormal labs. The patient is difficult historian, but able to provide some basic history.  She states that she had some labs drawn on 10/19/2023.  She thinks it may have been ordered by her primary care provider.  She was contacted to go to emergency department because of abnormal values, particularly low blood count. The patient has been complaining of generalized weakness and feeling weak in the knees for about a week.  She denies any dizziness or syncope.  She denies any fevers, chills, headache, chest pain, shortness breath, coughing, hemoptysis, nausea, vomiting, diarrhea.  There is no hematochezia. She states that she has been having melena for about a week.  She states that she did not think too much of it because she has been on iron.  She denies any abdominal pain.  She is not sure if she has been taking any NSAIDs recently. In the ED, the patient was afebrile and hemodynamically stable with oxygen saturation 97% on room air.  WBC 8.7, hemoglobin 6.6, platelets 226.  Sodium 137, potassium 4.4, bicarbonate 20, serum creatinine 1.69.  The patient deferred rectal exam in the ED. 1 unit PRBC was ordered. GI was consulted and will consult after admission.   Assessment/Plan:  Symptomatic anemia/acute blood loss anemia -Patient presented with hemoglobin 6.6 - Transfused 1 unit PRBC 10/20/2023 - Baseline hemoglobin~10 - Patient endorses intermittent melena - Iron saturation 7, ferritin 10, folate >40, B12--2177 - Clear liquid diet - continue pantoprazole  - Hold apixaban  - discussed with GI, Dr. Cindie 7/26 - Planning  for EGD and colonoscopy on 10/15/2023 once consents are obtained   Chronic HFrEF - 09/26/2023 echo EF 30-35%, normal RVF, moderate MR - Patient has some signs of fluid overload - Furosemide  40 mg IV given in the ED - Review of the [facility] record shows that the patient is not on any furosemide  at home - Continue Entresto  - Holding spironolactone  and metoprolol  secondary to soft blood pressures   Paroxysmal atrial fibrillation - Currently in sinus rhythm - Holding apixaban  - Holding metoprolol  secondary to soft blood pressure - Continue amiodarone  -  event monitor from 5/24 showed 408 SVT runs, some of them are atrial fibrillation. Event monitor from February 2025 did not reveal any evidence of atrial fibrillation but significant PAC burden was noted although she continues to be asymptomatic    Schizophrenia, unspecified type - Continue olanzapine  - Continue Depakote    CKD stage IIIb - Baseline creatinine 1.3-1.5 - Monitor BMP   Diabetes mellitus type 2, controlled - 10/21/23 hemoglobin A1c--5.2 - NovoLog  sliding scale - Holding metformin   Mixed hyperlipidemia - Continue statin       Family Communication:   Caretaker updated 7/26  Consultants:  GI  Code Status:  FULL  DVT Prophylaxis:  SCDs   Procedures: As Listed in Progress Note Above  Antibiotics: None      Subjective: Patient denies fevers, chills, headache, chest pain, dyspnea, nausea, vomiting, diarrhea, abdominal pain, dysuria, hematuria, hematochezia, and melena.   Objective: Vitals:   10/21/23 0717 10/21/23 0800 10/21/23 0808 10/21/23 1107  BP:   (!) 141/61   Pulse:  (!) 57 63  Resp:  12 12   Temp: 97.8 F (36.6 C)   97.8 F (36.6 C)  TempSrc: Axillary   Oral  SpO2:  94% 98%   Weight:      Height:        Intake/Output Summary (Last 24 hours) at 10/21/2023 1207 Last data filed at 10/21/2023 0900 Gross per 24 hour  Intake 500 ml  Output 2250 ml  Net -1750 ml   Weight change:   Exam:  General:  Pt is alert, follows commands appropriately, not in acute distress HEENT: No icterus, No thrush, No neck mass, Melbeta/AT Cardiovascular: RRR, S1/S2, no rubs, no gallops Respiratory: CTA bilaterally, no wheezing, no crackles, no rhonchi Abdomen: Soft/+BS, non tender, non distended, no guarding Extremities: 1+ LE edema, No lymphangitis, No petechiae, No rashes, no synovitis   Data Reviewed: I have personally reviewed following labs and imaging studies Basic Metabolic Panel: Recent Labs  Lab 10/20/23 1507 10/21/23 0444  NA 137 141  K 4.4 4.1  CL 105 106  CO2 20* 24  GLUCOSE 129* 72  BUN 29* 28*  CREATININE 1.69* 1.54*  CALCIUM  8.4* 8.7*   Liver Function Tests: No results for input(s): AST, ALT, ALKPHOS, BILITOT, PROT, ALBUMIN in the last 168 hours. No results for input(s): LIPASE, AMYLASE in the last 168 hours. No results for input(s): AMMONIA in the last 168 hours. Coagulation Profile: No results for input(s): INR, PROTIME in the last 168 hours. CBC: Recent Labs  Lab 10/20/23 1507 10/20/23 2108 10/21/23 0444  WBC 8.7  --  7.9  NEUTROABS 6.0  --   --   HGB 6.6* 8.8* 8.7*  HCT 21.1* 27.8* 27.7*  MCV 98.1  --  97.2  PLT 240  --  187   Cardiac Enzymes: No results for input(s): CKTOTAL, CKMB, CKMBINDEX, TROPONINI in the last 168 hours. BNP: Invalid input(s): POCBNP CBG: Recent Labs  Lab 10/21/23 0716 10/21/23 0823 10/21/23 1109  GLUCAP 66* 102* 130*   HbA1C: Recent Labs    10/21/23 0444  HGBA1C 5.2   Urine analysis:    Component Value Date/Time   COLORURINE YELLOW 07/12/2022 0924   APPEARANCEUR CLEAR 07/12/2022 0924   APPEARANCEUR Clear 01/06/2022 1100   LABSPEC 1.011 07/12/2022 0924   LABSPEC 1.005 10/28/2013 0457   PHURINE 7.0 07/12/2022 0924   GLUCOSEU NEGATIVE 07/12/2022 0924   GLUCOSEU 50 mg/dL 91/96/7984 9542   HGBUR NEGATIVE 07/12/2022 0924   BILIRUBINUR NEGATIVE 07/12/2022 0924   BILIRUBINUR  Negative 01/06/2022 1100   BILIRUBINUR Negative 10/28/2013 0457   KETONESUR NEGATIVE 07/12/2022 0924   PROTEINUR NEGATIVE 07/12/2022 0924   NITRITE NEGATIVE 07/12/2022 0924   LEUKOCYTESUR SMALL (A) 07/12/2022 0924   LEUKOCYTESUR 3+ 10/28/2013 0457   Sepsis Labs: @LABRCNTIP (procalcitonin:4,lacticidven:4) )No results found for this or any previous visit (from the past 240 hours).   Scheduled Meds:  amiodarone   200 mg Oral Daily   ascorbic acid   500 mg Oral Daily   atorvastatin   20 mg Oral Daily   Chlorhexidine  Gluconate Cloth  6 each Topical Daily   cholecalciferol   1,000 Units Oral Daily   divalproex   250 mg Oral Daily   ferrous sulfate   325 mg Oral BID WC   gabapentin   400 mg Oral QHS   insulin  aspart  0-5 Units Subcutaneous QHS   insulin  aspart  0-9 Units Subcutaneous TID WC   latanoprost   1 drop Both Eyes QHS   loratadine   10 mg Oral Daily   multivitamin with minerals  1 tablet  Oral Daily   OLANZapine   20 mg Oral QHS   OLANZapine   5 mg Oral QHS   pantoprazole  (PROTONIX ) IV  40 mg Intravenous Q12H   pantoprazole   80 mg Intravenous Once   sacubitril -valsartan   1 tablet Oral BID   senna-docusate  2 tablet Oral QHS   cyanocobalamin   500 mcg Oral Daily   Continuous Infusions:  sodium chloride  Stopped (10/21/23 0536)    Procedures/Studies: DG Chest Portable 1 View Result Date: 10/20/2023 CLINICAL DATA:  Weakness. EXAM: PORTABLE CHEST 1 VIEW COMPARISON:  07/12/2022. FINDINGS: Low lung volumes with bronchovascular crowding. Cardiomegaly. No focal consolidation, pleural effusion, or pneumothorax. No acute osseous abnormality. IMPRESSION: 1. Low lung volumes.  No acute cardiopulmonary findings. 2. Cardiomegaly. Electronically Signed   By: Harrietta Sherry M.D.   On: 10/20/2023 17:12   ECHOCARDIOGRAM COMPLETE Result Date: 09/26/2023    ECHOCARDIOGRAM REPORT   Patient Name:   Maria Pace Date of Exam: 09/26/2023 Medical Rec #:  [id number]      Height:       60.0 in Accession #:     [id number]     Weight:       194.0 lb Date of Birth:  May 18, 1940       BSA:          1.843 m Patient Age:    82 years       BP:           104/64 mmHg Patient Gender: F              HR:           67 bpm. Exam Location:  [name] Procedure: 2D Echo, 3D Echo, Cardiac Doppler and Color Doppler (Both Spectral            and Color Flow Doppler were utilized during procedure). Indications:    Stroke l63.9  History:        Patient has prior history of Echocardiogram examinations, most                 recent 06/28/2023. Arrythmias:Atrial Fibrillation; Risk                 Factors:Hypertension, Diabetes and Dyslipidemia. Hx of COVID-19.  Sonographer:    Aida Pizza RCS Referring Phys: [id number] VISHNU P MALLIPEDDI IMPRESSIONS  1. Left ventricular ejection fraction, by estimation, is 30 to 35%. The left ventricle has moderately decreased function. The left ventricle demonstrates regional wall motion abnormalities (see scoring diagram/findings for description). The left ventricular internal cavity size was mildly dilated. There is mild concentric left ventricular hypertrophy. Left ventricular diastolic parameters are indeterminate.  2. Right ventricular systolic function is normal. The right ventricular size is normal. Tricuspid regurgitation signal is inadequate for assessing PA pressure.  3. Left atrial size was moderately dilated.  4. The mitral valve is degenerative. Moderate mitral valve regurgitation.  5. The aortic valve is tricuspid. Aortic valve regurgitation is trivial. Aortic valve sclerosis is present, with no evidence of aortic valve stenosis. Aortic valve mean gradient measures 5.0 mmHg.  6. The inferior vena cava is normal in size with greater than 50% respiratory variability, suggesting right atrial pressure of 3 mmHg. Comparison(s): Prior images reviewed side by side. LVEF 30-35%, RV contraction normal. Moderate mitral regurgitation. FINDINGS  Left Ventricle: Left ventricular ejection fraction, by estimation, is  30 to 35%. The left ventricle has moderately decreased function. The left ventricle demonstrates regional wall motion abnormalities. The left ventricular internal cavity size  was mildly dilated. There is mild concentric left ventricular hypertrophy. Left ventricular diastolic function could not be evaluated due to atrial fibrillation. Left ventricular diastolic parameters are indeterminate.  LV Wall Scoring: The anterior wall, mid and distal lateral wall, entire septum, inferior wall, and apex are hypokinetic. The antero-lateral wall and basal inferolateral segment are normal. Right Ventricle: The right ventricular size is normal. No increase in right ventricular wall thickness. Right ventricular systolic function is normal. Tricuspid regurgitation signal is inadequate for assessing PA pressure. Left Atrium: Left atrial size was moderately dilated. Right Atrium: Right atrial size was normal in size. Pericardium: There is no evidence of pericardial effusion. Mitral Valve: The mitral valve is degenerative in appearance. There is mild thickening of the mitral valve leaflet(s). Moderate mitral valve regurgitation. Tricuspid Valve: The tricuspid valve is grossly normal. Tricuspid valve regurgitation is trivial. Aortic Valve: The aortic valve is tricuspid. There is moderate aortic valve annular calcification. Aortic valve regurgitation is trivial. Aortic valve sclerosis is present, with no evidence of aortic valve stenosis. Aortic valve mean gradient measures 5.0 mmHg. Aortic valve peak gradient measures 8.8 mmHg. Aortic valve area, by VTI measures 1.18 cm. Pulmonic Valve: The pulmonic valve was grossly normal. Pulmonic valve regurgitation is trivial. Aorta: The aortic root is normal in size and structure. Venous: The inferior vena cava is normal in size with greater than 50% respiratory variability, suggesting right atrial pressure of 3 mmHg. IAS/Shunts: No atrial level shunt detected by color flow Doppler. Additional  Comments: 3D was performed not requiring image post processing on an independent workstation and was indeterminate.  LEFT VENTRICLE PLAX 2D LVIDd:         5.90 cm   Diastology LVIDs:         4.60 cm   LV e' medial:    3.26 cm/s LV PW:         1.20 cm   LV E/e' medial:  27.1 LV IVS:        1.10 cm   LV e' lateral:   9.79 cm/s LVOT diam:     1.60 cm   LV E/e' lateral: 9.0 LV SV:         39 LV SV Index:   21 LVOT Area:     2.01 cm                           3D Volume EF:                          LV EDV:       203 ml                          LV ESV:       120 ml                          LV SV:        83 ml RIGHT VENTRICLE RV S prime:     11.40 cm/s TAPSE (M-mode): 2.5 cm LEFT ATRIUM             Index        RIGHT ATRIUM          Index LA diam:        5.10 cm 2.77 cm/m   RA Area:     9.92 cm LA Vol (  A2C):   73.4 ml 39.83 ml/m  RA Volume:   20.00 ml 10.85 ml/m LA Vol (A4C):   76.2 ml 41.35 ml/m LA Biplane Vol: 75.1 ml 40.76 ml/m  AORTIC VALVE AV Area (Vmax):    1.55 cm AV Area (Vmean):   1.34 cm AV Area (VTI):     1.18 cm AV Vmax:           148.00 cm/s AV Vmean:          106.000 cm/s AV VTI:            0.330 m AV Peak Grad:      8.8 mmHg AV Mean Grad:      5.0 mmHg LVOT Vmax:         114.00 cm/s LVOT Vmean:        70.700 cm/s LVOT VTI:          0.193 m LVOT/AV VTI ratio: 0.58  AORTA Ao Root diam: 3.00 cm MITRAL VALVE MV Area (PHT): 4.31 cm       SHUNTS MV Decel Time: 176 msec       Systemic VTI:  0.19 m MR Peak grad:    78.5 mmHg    Systemic Diam: 1.60 cm MR Mean grad:    45.0 mmHg MR Vmax:         443.00 cm/s MR Vmean:        313.0 cm/s MR PISA:         1.57 cm MR PISA Eff ROA: 13 mm MR PISA Radius:  0.50 cm MV E velocity: 88.20 cm/s MV A velocity: 80.10 cm/s MV E/A ratio:  1.10 Jayson Sierras MD Electronically signed by Jayson Sierras MD Signature Date/Time: 09/26/2023/4:22:32 PM    Final     Alm Schneider, DO  [facility]  If 7PM-7AM, please contact night-coverage [email or web address] Password  [email or web address] 10/21/2023, 12:07 PM   LOS: 0 days

## 2023-10-22 DIAGNOSIS — D62 Acute posthemorrhagic anemia: Secondary | ICD-10-CM | POA: Diagnosis not present

## 2023-10-22 DIAGNOSIS — I5022 Chronic systolic (congestive) heart failure: Secondary | ICD-10-CM | POA: Diagnosis not present

## 2023-10-22 DIAGNOSIS — K921 Melena: Secondary | ICD-10-CM | POA: Diagnosis not present

## 2023-10-22 DIAGNOSIS — I480 Paroxysmal atrial fibrillation: Secondary | ICD-10-CM | POA: Diagnosis not present

## 2023-10-22 DIAGNOSIS — F209 Schizophrenia, unspecified: Secondary | ICD-10-CM | POA: Diagnosis not present

## 2023-10-22 DIAGNOSIS — Z7901 Long term (current) use of anticoagulants: Secondary | ICD-10-CM | POA: Diagnosis not present

## 2023-10-22 LAB — BASIC METABOLIC PANEL WITH GFR
Anion gap: 11 (ref 5–15)
BUN: 21 mg/dL (ref 8–23)
CO2: 24 mmol/L (ref 22–32)
Calcium: 8.3 mg/dL — ABNORMAL LOW (ref 8.9–10.3)
Chloride: 101 mmol/L (ref 98–111)
Creatinine, Ser: 1.45 mg/dL — ABNORMAL HIGH (ref 0.44–1.00)
GFR, Estimated: 36 mL/min — ABNORMAL LOW (ref >60)
Glucose, Bld: 61 mg/dL — ABNORMAL LOW (ref 70–99)
Potassium: 4.4 mmol/L (ref 3.5–5.1)
Sodium: 136 mmol/L (ref 135–145)

## 2023-10-22 LAB — CBC
HCT: 24.6 % — ABNORMAL LOW (ref 36.0–46.0)
Hemoglobin: 7.9 g/dL — ABNORMAL LOW (ref 12.0–15.0)
MCH: 30.3 pg (ref 26.0–34.0)
MCHC: 32.1 g/dL (ref 30.0–36.0)
MCV: 94.3 fL (ref 80.0–100.0)
Platelets: 235 K/uL (ref 150–400)
RBC: 2.61 MIL/uL — ABNORMAL LOW (ref 3.87–5.11)
RDW: 18 % — ABNORMAL HIGH (ref 11.5–15.5)
WBC: 7.7 K/uL (ref 4.0–10.5)
nRBC: 0 % (ref 0.0–0.2)

## 2023-10-22 LAB — GLUCOSE, CAPILLARY
Glucose-Capillary: 71 mg/dL (ref 70–99)
Glucose-Capillary: 170 mg/dL — ABNORMAL HIGH (ref 70–99)
Glucose-Capillary: 68 mg/dL — ABNORMAL LOW (ref 70–99)
Glucose-Capillary: 160 mg/dL — ABNORMAL HIGH (ref 70–99)
Glucose-Capillary: 118 mg/dL — ABNORMAL HIGH (ref 70–99)

## 2023-10-22 MED ORDER — SODIUM CHLORIDE 0.9 % IV SOLN: INTRAVENOUS | Status: AC

## 2023-10-22 MED ORDER — PEG 3350-KCL-NA BICARB-NACL 420 G PO SOLR
4000.0000 mL | Freq: Once | ORAL | Status: AC
  Administered 2023-10-22: 4000 mL via ORAL

## 2023-10-22 NOTE — Progress Notes (Signed)
 Subjective: Discussed with RN, no bowel movements today.  Slight drop in hemoglobin to 7.9.  Patient without any complaints.  Tolerating clear liquid diet.  Objective: Vital signs in last 24 hours: Temp:  [97.5 F (36.4 C)-97.8 F (36.6 C)] 97.7 F (36.5 C) (07/27 0453) Pulse Rate:  [60-72] 64 (07/27 0453) Resp:  [12-20] 13 (07/26 2128) BP: (106-152)/(56-91) 106/79 (07/27 0453) SpO2:  [94 %-100 %] 98 % (07/27 0453) Last BM Date :  (unknown) General:   Alert and oriented, pleasant Head:  Normocephalic and atraumatic. Eyes:  No icterus, sclera clear. Conjuctiva pink.  Abdomen:  Bowel sounds present, soft, non-tender, non-distended. No HSM or hernias noted. No rebound or guarding. No masses appreciated  Msk:  Symmetrical without gross deformities. Normal posture. Extremities:  Without clubbing or edema. Neurologic:  Alert and  oriented x4;  grossly normal neurologically. Skin:  Warm and dry, intact without significant lesions.  Cervical Nodes:  No significant cervical adenopathy. Psych:  Alert and cooperative. Normal mood and affect.  Intake/Output from previous day: 07/26 0701 - 07/27 0700 In: -  Out: 1350 [Urine:1350] Intake/Output this shift: Total I/O In: 780 [P.O.:780] Out: -   Lab Results: Recent Labs    10/20/23 1507 10/20/23 2108 10/21/23 0444 10/22/23 0422  WBC 8.7  --  7.9 7.7  HGB 6.6* 8.8* 8.7* 7.9*  HCT 21.1* 27.8* 27.7* 24.6*  PLT 240  --  187 235   BMET Recent Labs    10/20/23 1507 10/21/23 0444 10/22/23 0422  NA 137 141 136  K 4.4 4.1 4.4  CL 105 106 101  CO2 20* 24 24  GLUCOSE 129* 72 61*  BUN 29* 28* 21  CREATININE 1.69* 1.54* 1.45*  CALCIUM  8.4* 8.7* 8.3*   LFT No results for input(s): PROT, ALBUMIN, AST, ALT, ALKPHOS, BILITOT, BILIDIR, IBILI in the last 72 hours. PT/INR No results for input(s): LABPROT, INR in the last 72 hours. Hepatitis Panel No results for input(s): HEPBSAG, HCVAB, HEPAIGM, HEPBIGM in  the last 72 hours.   Studies/Results: DG Chest Portable 1 View Result Date: 10/20/2023 CLINICAL DATA:  Weakness. EXAM: PORTABLE CHEST 1 VIEW COMPARISON:  07/12/2022. FINDINGS: Low lung volumes with bronchovascular crowding. Cardiomegaly. No focal consolidation, pleural effusion, or pneumothorax. No acute osseous abnormality. IMPRESSION: 1. Low lung volumes.  No acute cardiopulmonary findings. 2. Cardiomegaly. Electronically Signed   By: Harrietta Sherry M.D.   On: 10/20/2023 17:12    Assessment: *Acute on chronic anemia *Melena *Chronic anticoagulation with apixaban    Plan: Discussed in depth with patient today.  She reports dark stools though chronically on oral iron therapy.  Not the best historian.   Hemoccult pending.  Refused rectal examination.   Discussed further evaluation with upper endoscopy and colonoscopy with patient especially in the setting of chronic anticoagulation.  After further discussions today, patient is agreeable to both procedures.    Will prep colon tonight in anticipation of EGD and colonoscopy tomorrow.    The risks including infection, bleed, or perforation as well as benefits, limitations, alternatives and imponderables have been reviewed with the patient. Potential for esophageal dilation, biopsy, etc. have also been reviewed.  Questions have been answered. All parties agreeable.  Continue to monitor H&H and transfuse for less than 7.     Continue to hold apixaban .  Continue IV PPI twice daily.   Carlin POUR. Cindie, D.O. Gastroenterology and Hepatology [facility]   LOS: 0 days    10/22/2023, 10:35 AM

## 2023-10-22 NOTE — Progress Notes (Signed)
 PROGRESS NOTE  Maria Pace FMW:[id number] DOB: February 17, 1941 DOA: 10/20/2023 PCP: Wilmon Penton, NP  Brief History:  83 y/o female   with medical history significant of bipolar 1 disorder, schizophrenia, essential hypertension, GERD, hyperlipidemia, history of irregular heartbeat, type 2 diabetes mellitus, PAF on apixaban , HFrEF (EF 30-35%), HTN presenting with abnormal labs. The patient is difficult historian, but able to provide some basic history.  She states that she had some labs drawn on 10/19/2023.  She thinks it may have been ordered by her primary care provider.  She was contacted to go to emergency department because of abnormal values, particularly low blood count. The patient has been complaining of generalized weakness and feeling weak in the knees for about a week.  She denies any dizziness or syncope.  She denies any fevers, chills, headache, chest pain, shortness breath, coughing, hemoptysis, nausea, vomiting, diarrhea.  There is no hematochezia. She states that she has been having melena for about a week.  She states that she did not think too much of it because she has been on iron.  She denies any abdominal pain.  She is not sure if she has been taking any NSAIDs recently. In the ED, the patient was afebrile and hemodynamically stable with oxygen saturation 97% on room air.  WBC 8.7, hemoglobin 6.6, platelets 226.  Sodium 137, potassium 4.4, bicarbonate 20, serum creatinine 1.69.  The patient deferred rectal exam in the ED. 1 unit PRBC was ordered. GI was consulted and will consult after admission.   Assessment/Plan:  Symptomatic anemia/acute blood loss anemia -Patient presented with hemoglobin 6.6 - Transfused 1 unit PRBC 10/20/2023 - Baseline hemoglobin~10 - Patient endorses intermittent melena - Iron saturation 7, ferritin 10, folate >40, B12--2177 - Clear liquid diet - continue pantoprazole  - Holding apixaban  - discussed with GI, Dr. Cindie 7/26 - Planning  for EGD and colonoscopy on 10/23/2023    Chronic HFrEF - 09/26/2023 echo EF 30-35%, normal RVF, moderate MR - Patient has some signs of fluid overload - Furosemide  40 mg IV given in the ED - Review of the [facility] record shows that the patient is not on any furosemide  at home - Continue Entresto  - Holding spironolactone  and metoprolol  secondary to soft blood pressures   Paroxysmal atrial fibrillation - Currently in sinus rhythm - Holding apixaban  - Holding metoprolol  secondary to soft blood pressure - Continue amiodarone  -  event monitor from 5/24 showed 408 SVT runs, some of them are atrial fibrillation. Event monitor from February 2025 did not reveal any evidence of atrial fibrillation but significant PAC burden was noted although she continues to be asymptomatic    Schizophrenia, unspecified type - Continue olanzapine  - Continue Depakote    CKD stage IIIb - Baseline creatinine 1.3-1.5 - Monitor BMP   Diabetes mellitus type 2, controlled - 10/21/23 hemoglobin A1c--5.2 - NovoLog  sliding scale - Holding metformin   Mixed hyperlipidemia - Continue statin             Family Communication:   Caretaker updated 7/26   Consultants:  GI   Code Status:  FULL   DVT Prophylaxis:  SCDs     Procedures: As Listed in Progress Note Above   Antibiotics: None         Subjective:  Patient denies fevers, chills, headache, chest pain, dyspnea, nausea, vomiting, diarrhea, abdominal pain, dysuria, hematuria, hematochezia, and melena.  Objective: Vitals:   10/21/23 1645 10/21/23 2128 10/22/23 0453 10/22/23 1356  BP: 129/85 116/74 106/79 (!) 94/57  Pulse: 70 66 64 69  Resp: 18 13  20   Temp: 97.6 F (36.4 C) (!) 97.5 F (36.4 C) 97.7 F (36.5 C) 97.6 F (36.4 C)  TempSrc: Oral Oral Oral Oral  SpO2: 100% 95% 98% 97%  Weight:      Height:        Intake/Output Summary (Last 24 hours) at 10/22/2023 1609 Last data filed at 10/22/2023 1216 Gross per 24 hour  Intake 1560 ml   Output 1200 ml  Net 360 ml   Weight change:  Exam:  General:  Pt is alert, follows commands appropriately, not in acute distress HEENT: No icterus, No thrush, No neck mass, [place]/AT Cardiovascular: RRR, S1/S2, no rubs, no gallops Respiratory: CTA bilaterally, no wheezing, no crackles, no rhonchi Abdomen: Soft/+BS, non tender, non distended, no guarding Extremities: 1 + LE edema, No lymphangitis, No petechiae, No rashes, no synovitis   Data Reviewed: I have personally reviewed following labs and imaging studies Basic Metabolic Panel: Recent Labs  Lab 10/20/23 1507 10/21/23 0444 10/22/23 0422  NA 137 141 136  K 4.4 4.1 4.4  CL 105 106 101  CO2 20* 24 24  GLUCOSE 129* 72 61*  BUN 29* 28* 21  CREATININE 1.69* 1.54* 1.45*  CALCIUM  8.4* 8.7* 8.3*   Liver Function Tests: No results for input(s): AST, ALT, ALKPHOS, BILITOT, PROT, ALBUMIN in the last 168 hours. No results for input(s): LIPASE, AMYLASE in the last 168 hours. No results for input(s): AMMONIA in the last 168 hours. Coagulation Profile: No results for input(s): INR, PROTIME in the last 168 hours. CBC: Recent Labs  Lab 10/20/23 1507 10/20/23 2108 10/21/23 0444 10/22/23 0422  WBC 8.7  --  7.9 7.7  NEUTROABS 6.0  --   --   --   HGB 6.6* 8.8* 8.7* 7.9*  HCT 21.1* 27.8* 27.7* 24.6*  MCV 98.1  --  97.2 94.3  PLT 240  --  187 235   Cardiac Enzymes: No results for input(s): CKTOTAL, CKMB, CKMBINDEX, TROPONINI in the last 168 hours. BNP: Invalid input(s): POCBNP CBG: Recent Labs  Lab 10/21/23 1109 10/21/23 1608 10/21/23 2107 10/22/23 0735 10/22/23 1105  GLUCAP 130* 74 102* 71 170*   HbA1C: Recent Labs    10/21/23 0444  HGBA1C 5.2   Urine analysis:    Component Value Date/Time   COLORURINE YELLOW 07/12/2022 0924   APPEARANCEUR CLEAR 07/12/2022 0924   APPEARANCEUR Clear 01/06/2022 1100   LABSPEC 1.011 07/12/2022 0924   LABSPEC 1.005 10/28/2013 0457   PHURINE 7.0  07/12/2022 0924   GLUCOSEU NEGATIVE 07/12/2022 0924   GLUCOSEU 50 mg/dL 91/96/7984 9542   HGBUR NEGATIVE 07/12/2022 0924   BILIRUBINUR NEGATIVE 07/12/2022 0924   BILIRUBINUR Negative 01/06/2022 1100   BILIRUBINUR Negative 10/28/2013 0457   KETONESUR NEGATIVE 07/12/2022 0924   PROTEINUR NEGATIVE 07/12/2022 0924   NITRITE NEGATIVE 07/12/2022 0924   LEUKOCYTESUR SMALL (A) 07/12/2022 0924   LEUKOCYTESUR 3+ 10/28/2013 0457   Sepsis Labs: @LABRCNTIP (procalcitonin:4,lacticidven:4) ) Recent Results (from the past 240 hours)  MRSA Next Gen by PCR, Nasal     Status: None   Collection Time: 10/20/23  7:35 PM   Specimen: Nasal Mucosa; Nasal Swab  Result Value Ref Range Status   MRSA by PCR Next Gen NOT DETECTED NOT DETECTED Final    Comment: (NOTE) The GeneXpert MRSA Assay (FDA approved for NASAL specimens only), is one component of a comprehensive MRSA colonization surveillance program. It is not intended  to diagnose MRSA infection nor to guide or monitor treatment for MRSA infections. Test performance is not FDA approved in patients less than 13 years old. Performed at [facility], [address], [place], [place] [address]      Scheduled Meds:  amiodarone   200 mg Oral Daily   ascorbic acid   500 mg Oral Daily   atorvastatin   20 mg Oral Daily   Chlorhexidine  Gluconate Cloth  6 each Topical Daily   cholecalciferol   1,000 Units Oral Daily   divalproex   250 mg Oral Daily   ferrous sulfate   325 mg Oral BID WC   gabapentin   400 mg Oral QHS   insulin  aspart  0-5 Units Subcutaneous QHS   insulin  aspart  0-9 Units Subcutaneous TID WC   latanoprost   1 drop Both Eyes QHS   loratadine   10 mg Oral Daily   multivitamin with minerals  1 tablet Oral Daily   OLANZapine   20 mg Oral QHS   OLANZapine   5 mg Oral QHS   pantoprazole  (PROTONIX ) IV  40 mg Intravenous Q12H   pantoprazole   80 mg Intravenous Once   polyethylene glycol-electrolytes  4,000 mL Oral Once   sacubitril -valsartan   1 tablet  Oral BID   senna-docusate  2 tablet Oral QHS   cyanocobalamin   500 mcg Oral Daily   Continuous Infusions:  sodium chloride      sodium chloride  Stopped (10/21/23 0536)    Procedures/Studies: DG Chest Portable 1 View Result Date: 10/20/2023 CLINICAL DATA:  Weakness. EXAM: PORTABLE CHEST 1 VIEW COMPARISON:  07/12/2022. FINDINGS: Low lung volumes with bronchovascular crowding. Cardiomegaly. No focal consolidation, pleural effusion, or pneumothorax. No acute osseous abnormality. IMPRESSION: 1. Low lung volumes.  No acute cardiopulmonary findings. 2. Cardiomegaly. Electronically Signed   By: Harrietta Sherry M.D.   On: 10/20/2023 17:12   ECHOCARDIOGRAM COMPLETE Result Date: 09/26/2023    ECHOCARDIOGRAM REPORT   Patient Name:   NAOMIE DELENA HURST Date of Exam: 09/26/2023 Medical Rec #:  [id number]      Height:       60.0 in Accession #:    [id number]     Weight:       194.0 lb Date of Birth:  04-20-1940       BSA:          1.843 m Patient Age:    82 years       BP:           104/64 mmHg Patient Gender: F              HR:           67 bpm. Exam Location:  [name] Procedure: 2D Echo, 3D Echo, Cardiac Doppler and Color Doppler (Both Spectral            and Color Flow Doppler were utilized during procedure). Indications:    Stroke l63.9  History:        Patient has prior history of Echocardiogram examinations, most                 recent 06/28/2023. Arrythmias:Atrial Fibrillation; Risk                 Factors:Hypertension, Diabetes and Dyslipidemia. Hx of COVID-19.  Sonographer:    Aida Pizza RCS Referring Phys: [id number] VISHNU P MALLIPEDDI IMPRESSIONS  1. Left ventricular ejection fraction, by estimation, is 30 to 35%. The left ventricle has moderately decreased function. The left ventricle demonstrates regional wall motion abnormalities (see scoring diagram/findings  for description). The left ventricular internal cavity size was mildly dilated. There is mild concentric left ventricular hypertrophy. Left ventricular  diastolic parameters are indeterminate.  2. Right ventricular systolic function is normal. The right ventricular size is normal. Tricuspid regurgitation signal is inadequate for assessing PA pressure.  3. Left atrial size was moderately dilated.  4. The mitral valve is degenerative. Moderate mitral valve regurgitation.  5. The aortic valve is tricuspid. Aortic valve regurgitation is trivial. Aortic valve sclerosis is present, with no evidence of aortic valve stenosis. Aortic valve mean gradient measures 5.0 mmHg.  6. The inferior vena cava is normal in size with greater than 50% respiratory variability, suggesting right atrial pressure of 3 mmHg. Comparison(s): Prior images reviewed side by side. LVEF 30-35%, RV contraction normal. Moderate mitral regurgitation. FINDINGS  Left Ventricle: Left ventricular ejection fraction, by estimation, is 30 to 35%. The left ventricle has moderately decreased function. The left ventricle demonstrates regional wall motion abnormalities. The left ventricular internal cavity size was mildly dilated. There is mild concentric left ventricular hypertrophy. Left ventricular diastolic function could not be evaluated due to atrial fibrillation. Left ventricular diastolic parameters are indeterminate.  LV Wall Scoring: The anterior wall, mid and distal lateral wall, entire septum, inferior wall, and apex are hypokinetic. The antero-lateral wall and basal inferolateral segment are normal. Right Ventricle: The right ventricular size is normal. No increase in right ventricular wall thickness. Right ventricular systolic function is normal. Tricuspid regurgitation signal is inadequate for assessing PA pressure. Left Atrium: Left atrial size was moderately dilated. Right Atrium: Right atrial size was normal in size. Pericardium: There is no evidence of pericardial effusion. Mitral Valve: The mitral valve is degenerative in appearance. There is mild thickening of the mitral valve leaflet(s).  Moderate mitral valve regurgitation. Tricuspid Valve: The tricuspid valve is grossly normal. Tricuspid valve regurgitation is trivial. Aortic Valve: The aortic valve is tricuspid. There is moderate aortic valve annular calcification. Aortic valve regurgitation is trivial. Aortic valve sclerosis is present, with no evidence of aortic valve stenosis. Aortic valve mean gradient measures 5.0 mmHg. Aortic valve peak gradient measures 8.8 mmHg. Aortic valve area, by VTI measures 1.18 cm. Pulmonic Valve: The pulmonic valve was grossly normal. Pulmonic valve regurgitation is trivial. Aorta: The aortic root is normal in size and structure. Venous: The inferior vena cava is normal in size with greater than 50% respiratory variability, suggesting right atrial pressure of 3 mmHg. IAS/Shunts: No atrial level shunt detected by color flow Doppler. Additional Comments: 3D was performed not requiring image post processing on an independent workstation and was indeterminate.  LEFT VENTRICLE PLAX 2D LVIDd:         5.90 cm   Diastology LVIDs:         4.60 cm   LV e' medial:    3.26 cm/s LV PW:         1.20 cm   LV E/e' medial:  27.1 LV IVS:        1.10 cm   LV e' lateral:   9.79 cm/s LVOT diam:     1.60 cm   LV E/e' lateral: 9.0 LV SV:         39 LV SV Index:   21 LVOT Area:     2.01 cm                           3D Volume EF:  LV EDV:       203 ml                          LV ESV:       120 ml                          LV SV:        83 ml RIGHT VENTRICLE RV S prime:     11.40 cm/s TAPSE (M-mode): 2.5 cm LEFT ATRIUM             Index        RIGHT ATRIUM          Index LA diam:        5.10 cm 2.77 cm/m   RA Area:     9.92 cm LA Vol (A2C):   73.4 ml 39.83 ml/m  RA Volume:   20.00 ml 10.85 ml/m LA Vol (A4C):   76.2 ml 41.35 ml/m LA Biplane Vol: 75.1 ml 40.76 ml/m  AORTIC VALVE AV Area (Vmax):    1.55 cm AV Area (Vmean):   1.34 cm AV Area (VTI):     1.18 cm AV Vmax:           148.00 cm/s AV Vmean:           106.000 cm/s AV VTI:            0.330 m AV Peak Grad:      8.8 mmHg AV Mean Grad:      5.0 mmHg LVOT Vmax:         114.00 cm/s LVOT Vmean:        70.700 cm/s LVOT VTI:          0.193 m LVOT/AV VTI ratio: 0.58  AORTA Ao Root diam: 3.00 cm MITRAL VALVE MV Area (PHT): 4.31 cm       SHUNTS MV Decel Time: 176 msec       Systemic VTI:  0.19 m MR Peak grad:    78.5 mmHg    Systemic Diam: 1.60 cm MR Mean grad:    45.0 mmHg MR Vmax:         443.00 cm/s MR Vmean:        313.0 cm/s MR PISA:         1.57 cm MR PISA Eff ROA: 13 mm MR PISA Radius:  0.50 cm MV E velocity: 88.20 cm/s MV A velocity: 80.10 cm/s MV E/A ratio:  1.10 Jayson Sierras MD Electronically signed by Jayson Sierras MD Signature Date/Time: 09/26/2023/4:22:32 PM    Final     Alm Schneider, DO  [facility]  If 7PM-7AM, please contact night-coverage [email or web address] Password [email or web address] 10/22/2023, 4:09 PM   LOS: 0 days

## 2023-10-23 ENCOUNTER — Encounter (HOSPITAL_COMMUNITY)
Admission: EM | Disposition: A | Discharge status: Home / Self Care | Payer: Self-pay | Source: Skilled Nursing Facility | Attending: Emergency Medicine

## 2023-10-23 DIAGNOSIS — F209 Schizophrenia, unspecified: Secondary | ICD-10-CM | POA: Diagnosis not present

## 2023-10-23 DIAGNOSIS — I480 Paroxysmal atrial fibrillation: Secondary | ICD-10-CM | POA: Diagnosis not present

## 2023-10-23 DIAGNOSIS — D500 Iron deficiency anemia secondary to blood loss (chronic): Secondary | ICD-10-CM | POA: Diagnosis not present

## 2023-10-23 DIAGNOSIS — K921 Melena: Secondary | ICD-10-CM | POA: Diagnosis not present

## 2023-10-23 DIAGNOSIS — D649 Anemia, unspecified: Secondary | ICD-10-CM | POA: Diagnosis not present

## 2023-10-23 DIAGNOSIS — D62 Acute posthemorrhagic anemia: Secondary | ICD-10-CM | POA: Diagnosis not present

## 2023-10-23 LAB — CBC
HCT: 27.5 % — ABNORMAL LOW (ref 36.0–46.0)
Hemoglobin: 8.9 g/dL — ABNORMAL LOW (ref 12.0–15.0)
MCH: 30.1 pg (ref 26.0–34.0)
MCHC: 32.4 g/dL (ref 30.0–36.0)
MCV: 92.9 fL (ref 80.0–100.0)
Platelets: 275 K/uL (ref 150–400)
RBC: 2.96 MIL/uL — ABNORMAL LOW (ref 3.87–5.11)
RDW: 17.2 % — ABNORMAL HIGH (ref 11.5–15.5)
WBC: 9.8 K/uL (ref 4.0–10.5)
nRBC: 0 % (ref 0.0–0.2)

## 2023-10-23 LAB — OCCULT BLOOD, POC DEVICE: Fecal Occult Bld: POSITIVE — AB

## 2023-10-23 LAB — GLUCOSE, CAPILLARY
Glucose-Capillary: 79 mg/dL (ref 70–99)
Glucose-Capillary: 118 mg/dL — ABNORMAL HIGH (ref 70–99)
Glucose-Capillary: 111 mg/dL — ABNORMAL HIGH (ref 70–99)

## 2023-10-23 SURGERY — EGD (ESOPHAGOGASTRODUODENOSCOPY)
Anesthesia: Monitor Anesthesia Care

## 2023-10-23 NOTE — Plan of Care (Signed)
   Problem: Education: Goal: Knowledge of General Education information will improve Description Including pain rating scale, medication(s)/side effects and non-pharmacologic comfort measures Outcome: Progressing   Problem: Education: Goal: Knowledge of General Education information will improve Description Including pain rating scale, medication(s)/side effects and non-pharmacologic comfort measures Outcome: Progressing

## 2023-10-23 NOTE — Discharge Summary (Incomplete)
 Physician Discharge Summary   Patient: Maria Pace MRN: [id number] DOB: 1940-09-15  Admit date:     10/20/2023  Discharge date: 10/24/2023  Discharge Physician: Alm Naol Ontiveros   PCP: Gammon, Chrystal, NP   Recommendations at discharge:   Please follow up with primary care provider within 1-2 weeks  Please repeat BMP and CBC in one week     Hospital Course: 83 y/o female   with medical history significant of bipolar 1 disorder, schizophrenia, essential hypertension, GERD, hyperlipidemia, history of irregular heartbeat, type 2 diabetes mellitus, PAF on apixaban , HFrEF (EF 30-35%), HTN presenting with abnormal labs. The patient is difficult historian, but able to provide some basic history.  She states that she had some labs drawn on 10/19/2023.  She thinks it may have been ordered by her primary care provider.  She was contacted to go to emergency department because of abnormal values, particularly low blood count. The patient has been complaining of generalized weakness and feeling weak in the knees for about a week.  She denies any dizziness or syncope.  She denies any fevers, chills, headache, chest pain, shortness breath, coughing, hemoptysis, nausea, vomiting, diarrhea.  There is no hematochezia. She states that she has been having melena for about a week.  She states that she did not think too much of it because she has been on iron.  She denies any abdominal pain.  She is not sure if she has been taking any NSAIDs recently. In the ED, the patient was afebrile and hemodynamically stable with oxygen saturation 97% on room air.  WBC 8.7, hemoglobin 6.6, platelets 226.  Sodium 137, potassium 4.4, bicarbonate 20, serum creatinine 1.69.  The patient deferred rectal exam in the ED. 1 unit PRBC was ordered. GI was consulted and will consult after admission.  GI initially plan for EGD and colonoscopy.  The patient initially agreed and signed consent.  The patient subsequently had some difficulty  tolerating the colonoscopy preparation.  Out of her frustration, the patient decided that she did not want any further interventions and refused further EGD or colonoscopy.  The risks, benefits, and alternatives were discussed with the patient.  It was discussed with the patient that it was not safe to restart apixaban  without further clarifying source of bleeding.  Therefore, the patient will be discharged home with aspirin  in lieu of apixaban .  Assessment and Plan: Symptomatic anemia/acute blood loss anemia -Patient presented with hemoglobin 6.6 - Transfused 1 unit PRBC 10/20/2023 - Baseline hemoglobin~10 - Patient endorses intermittent melena - Iron saturation 7, ferritin 10, folate >40, B12--2177 - Clear liquid diet - continue pantoprazole  - Holding apixaban  since day of admission - discussed with GI, Dr. Cindie 7/26 - Planning for EGD and colonoscopy on 10/23/2023  - 10/23/23--pt had difficulty tolerating Nulytely --now refusing all procedures - 10/23/23--discussed with patient that I cannot safely restart apixaban  if she refuses procedures so apixaban  will Not be restarted--she expresses understanding   Chronic HFrEF - 09/26/2023 echo EF 30-35%, normal RVF, moderate MR - Patient had some signs of fluid overload - Furosemide  40 mg IV given in the ED - Review of the [facility] record shows that the patient is not on any furosemide  at home - Continue Entresto  - Holding spironolactone  and metoprolol  secondary to soft blood pressures - restart spiro at time of dc   Paroxysmal atrial fibrillation - Currently in sinus rhythm - Holding apixaban  - Holding metoprolol  secondary to soft blood pressure - Continue amiodarone  -  event monitor from 5/24  showed 408 SVT runs, some of them are atrial fibrillation. Event monitor from February 2025 did not reveal any evidence of atrial fibrillation but significant PAC burden was noted although she continues to be asymptomatic    Schizophrenia, unspecified  type - Continue olanzapine  - Continue Depakote    CKD stage IIIb - Baseline creatinine 1.3-1.5 - Monitor BMP   Diabetes mellitus type 2, controlled - 10/21/23 hemoglobin A1c--5.2 - NovoLog  sliding scale - Holding metformin   Mixed hyperlipidemia - Continue statin    {Tip this will not be part of the note when signed Body mass index is 37.89 kg/m. , ,  (Optional):26781}   Consultants: GI Procedures performed: none  Disposition: Assisted living Diet recommendation:  Cardiac diet DISCHARGE MEDICATION: Allergies as of 10/23/2023       Reactions   Haloperidol Lactate Other (See Comments)   Couldn't urinate anymore     Med Rec must be completed prior to using this [facility]***       Discharge Exam: Filed Weights   10/20/23 1449  Weight: 88 kg   HEENT:  [place]/AT, No thrush, no icterus CV:  RRR, no rub, no S3, no S4 Lung:  CTA, no wheeze, no rhonchi Abd:  soft/+BS, NT Ext:  1 + LE edema, no lymphangitis, no synovitis, no rash   Condition at discharge: stable  The results of significant diagnostics from this hospitalization (including imaging, microbiology, ancillary and laboratory) are listed below for reference.   Imaging Studies: DG Chest Portable 1 View Result Date: 10/20/2023 CLINICAL DATA:  Weakness. EXAM: PORTABLE CHEST 1 VIEW COMPARISON:  07/12/2022. FINDINGS: Low lung volumes with bronchovascular crowding. Cardiomegaly. No focal consolidation, pleural effusion, or pneumothorax. No acute osseous abnormality. IMPRESSION: 1. Low lung volumes.  No acute cardiopulmonary findings. 2. Cardiomegaly. Electronically Signed   By: Harrietta Sherry M.D.   On: 10/20/2023 17:12   ECHOCARDIOGRAM COMPLETE Result Date: 09/26/2023    ECHOCARDIOGRAM REPORT   Patient Name:   Maria Pace Date of Exam: 09/26/2023 Medical Rec #:  [id number]      Height:       60.0 in Accession #:    [id number]     Weight:       194.0 lb Date of Birth:  12-Oct-1940       BSA:          1.843 m Patient  Age:    82 years       BP:           104/64 mmHg Patient Gender: F              HR:           67 bpm. [name] Procedure: 2D Echo, 3D Echo, Cardiac Doppler and Color Doppler (Both Spectral            and Color Flow Doppler were utilized during procedure). Indications:    Stroke l63.9  History:        Patient has prior history of Echocardiogram examinations, most                 recent 06/28/2023. Arrythmias:Atrial Fibrillation; Risk                 Factors:Hypertension, Diabetes and Dyslipidemia. Hx of COVID-19.  Sonographer:    Aida Pizza RCS Referring Phys: [id number] VISHNU P MALLIPEDDI IMPRESSIONS  1. Left ventricular ejection fraction, by estimation, is 30 to 35%. The left ventricle has moderately decreased function. The left ventricle  demonstrates regional wall motion abnormalities (see scoring diagram/findings for description). The left ventricular internal cavity size was mildly dilated. There is mild concentric left ventricular hypertrophy. Left ventricular diastolic parameters are indeterminate.  2. Right ventricular systolic function is normal. The right ventricular size is normal. Tricuspid regurgitation signal is inadequate for assessing PA pressure.  3. Left atrial size was moderately dilated.  4. The mitral valve is degenerative. Moderate mitral valve regurgitation.  5. The aortic valve is tricuspid. Aortic valve regurgitation is trivial. Aortic valve sclerosis is present, with no evidence of aortic valve stenosis. Aortic valve mean gradient measures 5.0 mmHg.  6. The inferior vena cava is normal in size with greater than 50% respiratory variability, suggesting right atrial pressure of 3 mmHg. Comparison(s): Prior images reviewed side by side. LVEF 30-35%, RV contraction normal. Moderate mitral regurgitation. FINDINGS  Left Ventricle: Left ventricular ejection fraction, by estimation, is 30 to 35%. The left ventricle has moderately decreased function. The left ventricle demonstrates  regional wall motion abnormalities. The left ventricular internal cavity size was mildly dilated. There is mild concentric left ventricular hypertrophy. Left ventricular diastolic function could not be evaluated due to atrial fibrillation. Left ventricular diastolic parameters are indeterminate.  LV Wall Scoring: The anterior wall, mid and distal lateral wall, entire septum, inferior wall, and apex are hypokinetic. The antero-lateral wall and basal inferolateral segment are normal. Right Ventricle: The right ventricular size is normal. No increase in right ventricular wall thickness. Right ventricular systolic function is normal. Tricuspid regurgitation signal is inadequate for assessing PA pressure. Left Atrium: Left atrial size was moderately dilated. Right Atrium: Right atrial size was normal in size. Pericardium: There is no evidence of pericardial effusion. Mitral Valve: The mitral valve is degenerative in appearance. There is mild thickening of the mitral valve leaflet(s). Moderate mitral valve regurgitation. Tricuspid Valve: The tricuspid valve is grossly normal. Tricuspid valve regurgitation is trivial. Aortic Valve: The aortic valve is tricuspid. There is moderate aortic valve annular calcification. Aortic valve regurgitation is trivial. Aortic valve sclerosis is present, with no evidence of aortic valve stenosis. Aortic valve mean gradient measures 5.0 mmHg. Aortic valve peak gradient measures 8.8 mmHg. Aortic valve area, by VTI measures 1.18 cm. Pulmonic Valve: The pulmonic valve was grossly normal. Pulmonic valve regurgitation is trivial. Aorta: The aortic root is normal in size and structure. Venous: The inferior vena cava is normal in size with greater than 50% respiratory variability, suggesting right atrial pressure of 3 mmHg. IAS/Shunts: No atrial level shunt detected by color flow Doppler. Additional Comments: 3D was performed not requiring image post processing on an independent workstation and  was indeterminate.  LEFT VENTRICLE PLAX 2D LVIDd:         5.90 cm   Diastology LVIDs:         4.60 cm   LV e' medial:    3.26 cm/s LV PW:         1.20 cm   LV E/e' medial:  27.1 LV IVS:        1.10 cm   LV e' lateral:   9.79 cm/s LVOT diam:     1.60 cm   LV E/e' lateral: 9.0 LV SV:         39 LV SV Index:   21 LVOT Area:     2.01 cm                           3D Volume EF:  LV EDV:       203 ml                          LV ESV:       120 ml                          LV SV:        83 ml RIGHT VENTRICLE RV S prime:     11.40 cm/s TAPSE (M-mode): 2.5 cm LEFT ATRIUM             Index        RIGHT ATRIUM          Index LA diam:        5.10 cm 2.77 cm/m   RA Area:     9.92 cm LA Vol (A2C):   73.4 ml 39.83 ml/m  RA Volume:   20.00 ml 10.85 ml/m LA Vol (A4C):   76.2 ml 41.35 ml/m LA Biplane Vol: 75.1 ml 40.76 ml/m  AORTIC VALVE AV Area (Vmax):    1.55 cm AV Area (Vmean):   1.34 cm AV Area (VTI):     1.18 cm AV Vmax:           148.00 cm/s AV Vmean:          106.000 cm/s AV VTI:            0.330 m AV Peak Grad:      8.8 mmHg AV Mean Grad:      5.0 mmHg LVOT Vmax:         114.00 cm/s LVOT Vmean:        70.700 cm/s LVOT VTI:          0.193 m LVOT/AV VTI ratio: 0.58  AORTA Ao Root diam: 3.00 cm MITRAL VALVE MV Area (PHT): 4.31 cm       SHUNTS MV Decel Time: 176 msec       Systemic VTI:  0.19 m MR Peak grad:    78.5 mmHg    Systemic Diam: 1.60 cm MR Mean grad:    45.0 mmHg MR Vmax:         443.00 cm/s MR Vmean:        313.0 cm/s MR PISA:         1.57 cm MR PISA Eff ROA: 13 mm MR PISA Radius:  0.50 cm MV E velocity: 88.20 cm/s MV A velocity: 80.10 cm/s MV E/A ratio:  1.10 Jayson Sierras MD Electronically signed by Jayson Sierras MD Signature Date/Time: 09/26/2023/4:22:32 PM    Final     Microbiology: Results for orders placed or performed during the hospital encounter of 10/20/23  MRSA Next Gen by PCR, Nasal     Status: None   Collection Time: 10/20/23  7:35 PM   Specimen: Nasal Mucosa;  Nasal Swab  Result Value Ref Range Status   MRSA by PCR Next Gen NOT DETECTED NOT DETECTED Final    Comment: (NOTE) The GeneXpert MRSA Assay (FDA approved for NASAL specimens only), is one component of a comprehensive MRSA colonization surveillance program. It is not intended to diagnose MRSA infection nor to guide or monitor treatment for MRSA infections. Test performance is not FDA approved in patients less than 51 years old. Performed at [facility], [address] [place], [place] [address]     Labs: CBC: Recent Labs  Lab 10/20/23 1507 10/20/23 2108 10/21/23  9555 10/22/23 0422 10/23/23 0501  WBC 8.7  --  7.9 7.7 9.8  NEUTROABS 6.0  --   --   --   --   HGB 6.6* 8.8* 8.7* 7.9* 8.9*  HCT 21.1* 27.8* 27.7* 24.6* 27.5*  MCV 98.1  --  97.2 94.3 92.9  PLT 240  --  187 235 275   Basic Metabolic Panel: Recent Labs  Lab 10/20/23 1507 10/21/23 0444 10/22/23 0422  NA 137 141 136  K 4.4 4.1 4.4  CL 105 106 101  CO2 20* 24 24  GLUCOSE 129* 72 61*  BUN 29* 28* 21  CREATININE 1.69* 1.54* 1.45*  CALCIUM  8.4* 8.7* 8.3*   Liver Function Tests: No results for input(s): AST, ALT, ALKPHOS, BILITOT, PROT, ALBUMIN in the last 168 hours. CBG: Recent Labs  Lab 10/22/23 1903 10/22/23 2027 10/23/23 0720 10/23/23 1113 10/23/23 1554  GLUCAP 160* 118* 79 118* 111*    Discharge time spent: greater than 30 minutes.  Signed: Alm Schneider, MD [facility] 10/23/2023

## 2023-10-23 NOTE — Progress Notes (Signed)
 Gastroenterology Progress Note     Patient ID: LEYAN BRANDEN; [id number]; 1940/06/11    Subjective   Patient sitting up in chair. Did not finish bowel prep overnight, stating she couldn't tolerate the taste. No bowel movements. She is declining any further oral prep, even Miralax . She is declining colonoscopy and EGD for tomorrow. Alert and oriented to person, place, year.    Objective   Vital signs in last 24 hours Temp:  [97.6 F (36.4 C)-97.9 F (36.6 C)] 97.6 F (36.4 C) (07/28 0844) Pulse Rate:  [69-105] 99 (07/28 0844) Resp:  [18-20] 20 (07/28 0844) BP: (93-120)/(55-89) 110/82 (07/28 0844) SpO2:  [92 %-97 %] 92 % (07/28 0844) Last BM Date :  (unknown)  Physical Exam General:   Alert and oriented, pleasant, sitting up in chair, no distress.  Head:  Normocephalic and atraumatic. Abdomen:  Bowel sounds present, obese, soft, non-tender, non-distended, limited exam as patient sitting up in chair.  Extremities:  With lower extremity edema, pedal/ankle edema Neurologic:  Alert and  oriented x4   Intake/Output from previous day: 07/27 0701 - 07/28 0700 In: 1560 [P.O.:1560] Out: 700 [Urine:700] Intake/Output this shift: No intake/output data recorded.  Lab Results  Recent Labs    10/21/23 0444 10/22/23 0422 10/23/23 0501  WBC 7.9 7.7 9.8  HGB 8.7* 7.9* 8.9*  HCT 27.7* 24.6* 27.5*  PLT 187 235 275   BMET Recent Labs    10/20/23 1507 10/21/23 0444 10/22/23 0422  NA 137 141 136  K 4.4 4.1 4.4  CL 105 106 101  CO2 20* 24 24  GLUCOSE 129* 72 61*  BUN 29* 28* 21  CREATININE 1.69* 1.54* 1.45*  CALCIUM  8.4* 8.7* 8.3*    Studies/Results DG Chest Portable 1 View Result Date: 10/20/2023 CLINICAL DATA:  Weakness. EXAM: PORTABLE CHEST 1 VIEW COMPARISON:  07/12/2022. FINDINGS: Low lung volumes with bronchovascular crowding. Cardiomegaly. No focal consolidation, pleural effusion, or pneumothorax. No acute osseous abnormality. IMPRESSION: 1. Low lung volumes.  No  acute cardiopulmonary findings. 2. Cardiomegaly. Electronically Signed   By: Harrietta Sherry M.D.   On: 10/20/2023 17:12   ECHOCARDIOGRAM COMPLETE Result Date: 09/26/2023    ECHOCARDIOGRAM REPORT   Patient Name:   NAOMIE DELENA HURST Date of Exam: 09/26/2023 Medical Rec #:  [id number]      Height:       60.0 in Accession #:    [id number]     Weight:       194.0 lb Date of Birth:  1940/04/08       BSA:          1.843 m Patient Age:    82 years       BP:           104/64 mmHg Patient Gender: F              HR:           67 bpm. [facility] Location[name] Procedure: 2D Echo, 3D Echo, Cardiac Doppler and Color Doppler (Both Spectral            and Color Flow Doppler were utilized during procedure). Indications:    Stroke l63.9  History:        Patient has prior history of Echocardiogram examinations, most                 recent 06/28/2023. Arrythmias:Atrial Fibrillation; Risk  Factors:Hypertension, Diabetes and Dyslipidemia. Hx of COVID-19.  Sonographer:    Aida Pizza RCS Referring Phys: [id number] VISHNU P MALLIPEDDI IMPRESSIONS  1. Left ventricular ejection fraction, by estimation, is 30 to 35%. The left ventricle has moderately decreased function. The left ventricle demonstrates regional wall motion abnormalities (see scoring diagram/findings for description). The left ventricular internal cavity size was mildly dilated. There is mild concentric left ventricular hypertrophy. Left ventricular diastolic parameters are indeterminate.  2. Right ventricular systolic function is normal. The right ventricular size is normal. Tricuspid regurgitation signal is inadequate for assessing PA pressure.  3. Left atrial size was moderately dilated.  4. The mitral valve is degenerative. Moderate mitral valve regurgitation.  5. The aortic valve is tricuspid. Aortic valve regurgitation is trivial. Aortic valve sclerosis is present, with no evidence of aortic valve stenosis. Aortic valve mean gradient measures 5.0 mmHg.  6. The  inferior vena cava is normal in size with greater than 50% respiratory variability, suggesting right atrial pressure of 3 mmHg. Comparison(s): Prior images reviewed side by side. LVEF 30-35%, RV contraction normal. Moderate mitral regurgitation. FINDINGS  Left Ventricle: Left ventricular ejection fraction, by estimation, is 30 to 35%. The left ventricle has moderately decreased function. The left ventricle demonstrates regional wall motion abnormalities. The left ventricular internal cavity size was mildly dilated. There is mild concentric left ventricular hypertrophy. Left ventricular diastolic function could not be evaluated due to atrial fibrillation. Left ventricular diastolic parameters are indeterminate.  LV Wall Scoring: The anterior wall, mid and distal lateral wall, entire septum, inferior wall, and apex are hypokinetic. The antero-lateral wall and basal inferolateral segment are normal. Right Ventricle: The right ventricular size is normal. No increase in right ventricular wall thickness. Right ventricular systolic function is normal. Tricuspid regurgitation signal is inadequate for assessing PA pressure. Left Atrium: Left atrial size was moderately dilated. Right Atrium: Right atrial size was normal in size. Pericardium: There is no evidence of pericardial effusion. Mitral Valve: The mitral valve is degenerative in appearance. There is mild thickening of the mitral valve leaflet(s). Moderate mitral valve regurgitation. Tricuspid Valve: The tricuspid valve is grossly normal. Tricuspid valve regurgitation is trivial. Aortic Valve: The aortic valve is tricuspid. There is moderate aortic valve annular calcification. Aortic valve regurgitation is trivial. Aortic valve sclerosis is present, with no evidence of aortic valve stenosis. Aortic valve mean gradient measures 5.0 mmHg. Aortic valve peak gradient measures 8.8 mmHg. Aortic valve area, by VTI measures 1.18 cm. Pulmonic Valve: The pulmonic valve was  grossly normal. Pulmonic valve regurgitation is trivial. Aorta: The aortic root is normal in size and structure. Venous: The inferior vena cava is normal in size with greater than 50% respiratory variability, suggesting right atrial pressure of 3 mmHg. IAS/Shunts: No atrial level shunt detected by color flow Doppler. Additional Comments: 3D was performed not requiring image post processing on an independent workstation and was indeterminate.  LEFT VENTRICLE PLAX 2D LVIDd:         5.90 cm   Diastology LVIDs:         4.60 cm   LV e' medial:    3.26 cm/s LV PW:         1.20 cm   LV E/e' medial:  27.1 LV IVS:        1.10 cm   LV e' lateral:   9.79 cm/s LVOT diam:     1.60 cm   LV E/e' lateral: 9.0 LV SV:         39  LV SV Index:   21 LVOT Area:     2.01 cm                           3D Volume EF:                          LV EDV:       203 ml                          LV ESV:       120 ml                          LV SV:        83 ml RIGHT VENTRICLE RV S prime:     11.40 cm/s TAPSE (M-mode): 2.5 cm LEFT ATRIUM             Index        RIGHT ATRIUM          Index LA diam:        5.10 cm 2.77 cm/m   RA Area:     9.92 cm LA Vol (A2C):   73.4 ml 39.83 ml/m  RA Volume:   20.00 ml 10.85 ml/m LA Vol (A4C):   76.2 ml 41.35 ml/m LA Biplane Vol: 75.1 ml 40.76 ml/m  AORTIC VALVE AV Area (Vmax):    1.55 cm AV Area (Vmean):   1.34 cm AV Area (VTI):     1.18 cm AV Vmax:           148.00 cm/s AV Vmean:          106.000 cm/s AV VTI:            0.330 m AV Peak Grad:      8.8 mmHg AV Mean Grad:      5.0 mmHg LVOT Vmax:         114.00 cm/s LVOT Vmean:        70.700 cm/s LVOT VTI:          0.193 m LVOT/AV VTI ratio: 0.58  AORTA Ao Root diam: 3.00 cm MITRAL VALVE MV Area (PHT): 4.31 cm       SHUNTS MV Decel Time: 176 msec       Systemic VTI:  0.19 m MR Peak grad:    78.5 mmHg    Systemic Diam: 1.60 cm MR Mean grad:    45.0 mmHg MR Vmax:         443.00 cm/s MR Vmean:        313.0 cm/s MR PISA:         1.57 cm MR PISA Eff ROA: 13 mm  MR PISA Radius:  0.50 cm MV E velocity: 88.20 cm/s MV A velocity: 80.10 cm/s MV E/A ratio:  1.10 Jayson Sierras MD Electronically signed by Jayson Sierras MD Signature Date/Time: 09/26/2023/4:22:32 PM    Final     Assessment  83 y.o. female with a history of bipolar disorder, schizophrenia, GERD, dyslipidemia, paroxysmal atrial fibrillation on apixaban , CHF, HTN, resides at [name] facility, admitted with symptomatic anemia, possible melena, heme positive, with GI consulted for further evaluation. Eliquis  on hold.   Symptomatic anemia: Hgb 6.6 on admission, prior baseline 9/10 range, evidence for IDA with ferritin markedly low at 10, receiving 1 unit PRBCs with improvement in  Hgb to 8 range. Hgb 8.9 this morning. No overt GI bleeding. Reported dark stools but had been on iron; notably, she was heme positive this admission. Attempted prep overnight and patient did not tolerate due to taste. Declining any further prep even if alternate such as Miralax . She is declining any procedures; I discussed possibility of endoscopy only as declining prep, and she is declining this as well. Unknown if any prior colonoscopy/EGD: it does appear she had been referred to us  in 2015 for colonoscopy but did not complete outpatient visit.   Continue to hold Eliquis . Will discuss with hospitalist and Dr. Cindie. If patient is adamant about refusing prep, could consider EGD only if she is willing, although she is declining this currently. May have clears.      Plan / Recommendations  PPI BID Clear liquids Hold Eliquis  for now Consider EGD only if patient willing. Would recommend Miralax  prep if she is willing to attempt prep again. Tentatively can still plan on colonoscopy/EGD on 7/29 if patient changes her mind today.     LOS: 0 days    10/23/2023, 9:31 AM  Therisa MICAEL Stager, PhD, [facility]

## 2023-10-23 NOTE — TOC Initial Note (Signed)
 Transition of Care [facility]) - Initial/Assessment Note    Patient Details  Name: Maria Pace MRN: [id number] Date of Birth: January 01, 1941  Transition of Care [facility]) CM/SW Contact:    Hoy DELENA Bigness, [name] Phone Number: 10/23/2023, 11:27 AM  Clinical Narrative:                 Pt from [facility]. Spoke with Randie Minerva and confirmed pt able to return at discharge. FL-2 and DC summary will need to be emailed for review to [email or web address]. [facility] able to provide transportation for pt at discharge. TOC will continue to follow.   Expected Discharge Plan: Group Home (Family care home) Barriers to Discharge: No Barriers Identified   Patient Goals and CMS Choice Patient states their goals for this hospitalization and ongoing recovery are:: To return to [facility] CMS Medicare.gov Compare Post Acute Care list provided to:: Patient Choice offered to / list presented to : Patient [place] ownership interest in [facility].provided to::  (NA)    Expected Discharge Plan and Services In-house Referral: Clinical Social Work Discharge Planning Services: NA Post Acute Care Choice: NA Living arrangements for the past 2 months: Group Home (Family care home)                 DME Arranged: N/A DME Agency: NA                  Prior Living Arrangements/Services Living arrangements for the past 2 months: Group Home (Family care home) Lives with:: Facility Resident Patient language and need for interpreter reviewed:: Yes Do you feel safe going back to the place where you live?: Yes      Need for Family Participation in Patient Care: No (Comment) Care giver support system in place?: Yes (comment) Current home services: DME (RW) Criminal Activity/Legal Involvement Pertinent to Current Situation/Hospitalization: No - Comment as needed  Activities of Daily Living   ADL Screening (condition at time of admission) Is the patient deaf or have  difficulty hearing?: No Does the patient have difficulty seeing, even when wearing glasses/contacts?: No Does the patient have difficulty concentrating, remembering, or making decisions?: Yes  Permission Sought/Granted Permission sought to share information with : Facility Industrial/product designer granted to share information with : Yes, Verbal Permission Granted     Permission granted to share info w AGENCY: [facility]        Emotional Assessment Appearance:: Appears stated age Attitude/Demeanor/Rapport: Engaged Affect (typically observed): Accepting Orientation: : Oriented to Self, Oriented to Place, Oriented to  Time, Oriented to Situation Alcohol  / Substance Use: Not Applicable Psych Involvement: Outpatient Provider  Admission diagnosis:  Acute blood loss anemia [D62] Symptomatic anemia [D64.9] Patient Active Problem List   Diagnosis Date Noted   Symptomatic anemia 10/21/2023   Melena 10/21/2023   Chronic anticoagulation 10/21/2023   Acute blood loss anemia 10/20/2023   Chronic HFrEF (heart failure with reduced ejection fraction) (HCC) 10/20/2023   Current use of long term anticoagulation 09/01/2023   PAF (paroxysmal atrial fibrillation) (HCC) 09/01/2023   Sepsis due to COVID-19 (HCC) 07/12/2022   Chronic combined systolic and diastolic congestive heart failure (HCC)    ARF (acute renal failure) (HCC) 10/04/2019   Elevated CK 10/03/2019   SVT (supraventricular tachycardia) (HCC) 10/03/2019   GERD (gastroesophageal reflux disease)    Schizophrenia (HCC) 11/15/2017   Hypotension 02/20/2017   Severe dehydration 02/20/2017   AKI (acute kidney injury) (HCC) 02/20/2017   Constipation 02/20/2017  Lactic acidosis 02/20/2017   Urinary retention 11/10/2016   Hypertension 11/10/2016   Type 2 diabetes mellitus (HCC) 11/10/2016   Hyperlipidemia 11/10/2016   Hyponatremia 11/10/2016   SBO (small bowel obstruction) (HCC) 11/03/2015   PCP:  Wilmon Penton,  NP Pharmacy:   [facility] [place] - [address] [place] [address] Phone: [phone] Fax: [phone]     Social Drivers of Health (SDOH) Social History: SDOH Screenings   Food Insecurity: No Food Insecurity (10/21/2023)  Housing: Unknown (10/21/2023)  Transportation Needs: No Transportation Needs (10/21/2023)  Utilities: Not At Risk (10/21/2023)  Depression (PHQ2-9): High Risk (09/06/2021)  Social Connections: Unknown (10/21/2023)  Tobacco Use: Low Risk  (10/20/2023)   SDOH Interventions:     Readmission Risk Interventions    07/14/2022   11:16 AM  Readmission Risk Prevention Plan  Transportation Screening Complete  PCP or Specialist Appt within 5-7 Days Not Complete  Home Care Screening Complete  Medication Review (RN CM) Complete

## 2023-10-23 NOTE — Progress Notes (Signed)
 PROGRESS NOTE  Maria Pace FMW:[id number] DOB: 1940-08-13 DOA: 10/20/2023 PCP: Wilmon Penton, NP  Brief History:  83 y/o female   with medical history significant of bipolar 1 disorder, schizophrenia, essential hypertension, GERD, hyperlipidemia, history of irregular heartbeat, type 2 diabetes mellitus, PAF on apixaban , HFrEF (EF 30-35%), HTN presenting with abnormal labs. The patient is difficult historian, but able to provide some basic history.  She states that she had some labs drawn on 10/19/2023.  She thinks it may have been ordered by her primary care provider.  She was contacted to go to emergency department because of abnormal values, particularly low blood count. The patient has been complaining of generalized weakness and feeling weak in the knees for about a week.  She denies any dizziness or syncope.  She denies any fevers, chills, headache, chest pain, shortness breath, coughing, hemoptysis, nausea, vomiting, diarrhea.  There is no hematochezia. She states that she has been having melena for about a week.  She states that she did not think too much of it because she has been on iron.  She denies any abdominal pain.  She is not sure if she has been taking any NSAIDs recently. In the ED, the patient was afebrile and hemodynamically stable with oxygen saturation 97% on room air.  WBC 8.7, hemoglobin 6.6, platelets 226.  Sodium 137, potassium 4.4, bicarbonate 20, serum creatinine 1.69.  The patient deferred rectal exam in the ED. 1 unit PRBC was ordered. GI was consulted and will consult after admission.   Assessment/Plan:  Symptomatic anemia/acute blood loss anemia -Patient presented with hemoglobin 6.6 - Transfused 1 unit PRBC 10/20/2023 - Baseline hemoglobin~10 - Patient endorses intermittent melena - Iron saturation 7, ferritin 10, folate >40, B12--2177 - Clear liquid diet - continue pantoprazole  - Holding apixaban  since day of admission - discussed with GI, Dr.  Cindie 7/26 - Planning for EGD and colonoscopy on 10/23/2023  - 10/23/23--pt had difficulty tolerating Nulytely --now refusing all procedures - 10/23/23--discussed with patient that I cannot safely restart apixaban  if she refuses procedures so apixaban  will Not be restarted--she expresses understanding   Chronic HFrEF - 09/26/2023 echo EF 30-35%, normal RVF, moderate MR - Patient had some signs of fluid overload - Furosemide  40 mg IV given in the ED - Review of the [facility] record shows that the patient is not on any furosemide  at home - Continue Entresto  - Holding spironolactone  and metoprolol  secondary to soft blood pressures   Paroxysmal atrial fibrillation - Currently in sinus rhythm - Holding apixaban  - Holding metoprolol  secondary to soft blood pressure - Continue amiodarone  -  event monitor from 5/24 showed 408 SVT runs, some of them are atrial fibrillation. Event monitor from February 2025 did not reveal any evidence of atrial fibrillation but significant PAC burden was noted although she continues to be asymptomatic    Schizophrenia, unspecified type - Continue olanzapine  - Continue Depakote    CKD stage IIIb - Baseline creatinine 1.3-1.5 - Monitor BMP   Diabetes mellitus type 2, controlled - 10/21/23 hemoglobin A1c--5.2 - NovoLog  sliding scale - Holding metformin   Mixed hyperlipidemia - Continue statin             Family Communication:   Caretaker updated 7/28   Consultants:  GI   Code Status:  FULL   DVT Prophylaxis:  SCDs     Procedures: As Listed in Progress Note Above   Antibiotics: None       Subjective:  Patient denies fevers, chills, headache, chest pain, dyspnea, nausea, vomiting, diarrhea, abdominal pain, dysuria, hematuria, hematochezia   Objective: Vitals:   10/23/23 0444 10/23/23 0642 10/23/23 0844 10/23/23 1340  BP: (!) 93/55 120/75 110/82 94/71  Pulse:  (!) 105 99 85  Resp: 19  20 20   Temp: 97.9 F (36.6 C)  97.6 F (36.4 C) 98.4 F  (36.9 C)  TempSrc: Oral  Oral Oral  SpO2: 92%  92% 94%  Weight:      Height:        Intake/Output Summary (Last 24 hours) at 10/23/2023 1725 Last data filed at 10/23/2023 1524 Gross per 24 hour  Intake 1251.18 ml  Output 700 ml  Net 551.18 ml   Weight change:  Exam:  General:  Pt is alert, follows commands appropriately, not in acute distress HEENT: No icterus, No thrush, No neck mass, [place]/AT Cardiovascular: RRR, S1/S2, no rubs, no gallops Respiratory: CTA bilaterally, no wheezing, no crackles, no rhonchi Abdomen: Soft/+BS, non tender, non distended, no guarding Extremities: 1 +LE edema, No lymphangitis, No petechiae, No rashes, no synovitis   Data Reviewed: I have personally reviewed following labs and imaging studies Basic Metabolic Panel: Recent Labs  Lab 10/20/23 1507 10/21/23 0444 10/22/23 0422  NA 137 141 136  K 4.4 4.1 4.4  CL 105 106 101  CO2 20* 24 24  GLUCOSE 129* 72 61*  BUN 29* 28* 21  CREATININE 1.69* 1.54* 1.45*  CALCIUM  8.4* 8.7* 8.3*   Liver Function Tests: No results for input(s): AST, ALT, ALKPHOS, BILITOT, PROT, ALBUMIN in the last 168 hours. No results for input(s): LIPASE, AMYLASE in the last 168 hours. No results for input(s): AMMONIA in the last 168 hours. Coagulation Profile: No results for input(s): INR, PROTIME in the last 168 hours. CBC: Recent Labs  Lab 10/20/23 1507 10/20/23 2108 10/21/23 0444 10/22/23 0422 10/23/23 0501  WBC 8.7  --  7.9 7.7 9.8  NEUTROABS 6.0  --   --   --   --   HGB 6.6* 8.8* 8.7* 7.9* 8.9*  HCT 21.1* 27.8* 27.7* 24.6* 27.5*  MCV 98.1  --  97.2 94.3 92.9  PLT 240  --  187 235 275   Cardiac Enzymes: No results for input(s): CKTOTAL, CKMB, CKMBINDEX, TROPONINI in the last 168 hours. BNP: Invalid input(s): POCBNP CBG: Recent Labs  Lab 10/22/23 1903 10/22/23 2027 10/23/23 0720 10/23/23 1113 10/23/23 1554  GLUCAP 160* 118* 79 118* 111*   HbA1C: Recent Labs     10/21/23 0444  HGBA1C 5.2   Urine analysis:    Component Value Date/Time   COLORURINE YELLOW 07/12/2022 0924   APPEARANCEUR CLEAR 07/12/2022 0924   APPEARANCEUR Clear 01/06/2022 1100   LABSPEC 1.011 07/12/2022 0924   LABSPEC 1.005 10/28/2013 0457   PHURINE 7.0 07/12/2022 0924   GLUCOSEU NEGATIVE 07/12/2022 0924   GLUCOSEU 50 mg/dL 91/96/7984 9542   HGBUR NEGATIVE 07/12/2022 0924   BILIRUBINUR NEGATIVE 07/12/2022 0924   BILIRUBINUR Negative 01/06/2022 1100   BILIRUBINUR Negative 10/28/2013 0457   KETONESUR NEGATIVE 07/12/2022 0924   PROTEINUR NEGATIVE 07/12/2022 0924   NITRITE NEGATIVE 07/12/2022 0924   LEUKOCYTESUR SMALL (A) 07/12/2022 0924   LEUKOCYTESUR 3+ 10/28/2013 0457   Sepsis Labs: @LABRCNTIP (procalcitonin:4,lacticidven:4) ) Recent Results (from the past 240 hours)  MRSA Next Gen by PCR, Nasal     Status: None   Collection Time: 10/20/23  7:35 PM   Specimen: Nasal Mucosa; Nasal Swab  Result Value Ref Range Status   MRSA by  PCR Next Gen NOT DETECTED NOT DETECTED Final    Comment: (NOTE) The GeneXpert MRSA Assay (FDA approved for NASAL specimens only), is one component of a comprehensive MRSA colonization surveillance program. It is not intended to diagnose MRSA infection nor to guide or monitor treatment for MRSA infections. Test performance is not FDA approved in patients less than 68 years old. Performed at [facility], [address] [place], [place] [address]      Scheduled Meds:  amiodarone   200 mg Oral Daily   ascorbic acid   500 mg Oral Daily   atorvastatin   20 mg Oral Daily   Chlorhexidine  Gluconate Cloth  6 each Topical Daily   cholecalciferol   1,000 Units Oral Daily   divalproex   250 mg Oral Daily   ferrous sulfate   325 mg Oral BID WC   gabapentin   400 mg Oral QHS   insulin  aspart  0-5 Units Subcutaneous QHS   insulin  aspart  0-9 Units Subcutaneous TID WC   latanoprost   1 drop Both Eyes QHS   loratadine   10 mg Oral Daily   multivitamin with  minerals  1 tablet Oral Daily   OLANZapine   20 mg Oral QHS   OLANZapine   5 mg Oral QHS   pantoprazole  (PROTONIX ) IV  40 mg Intravenous Q12H   pantoprazole   80 mg Intravenous Once   sacubitril -valsartan   1 tablet Oral BID   senna-docusate  2 tablet Oral QHS   cyanocobalamin   500 mcg Oral Daily   Continuous Infusions:  sodium chloride  Stopped (10/21/23 0536)    Procedures/Studies: DG Chest Portable 1 View Result Date: 10/20/2023 CLINICAL DATA:  Weakness. EXAM: PORTABLE CHEST 1 VIEW COMPARISON:  07/12/2022. FINDINGS: Low lung volumes with bronchovascular crowding. Cardiomegaly. No focal consolidation, pleural effusion, or pneumothorax. No acute osseous abnormality. IMPRESSION: 1. Low lung volumes.  No acute cardiopulmonary findings. 2. Cardiomegaly. Electronically Signed   By: Harrietta Sherry M.D.   On: 10/20/2023 17:12   ECHOCARDIOGRAM COMPLETE Result Date: 09/26/2023    ECHOCARDIOGRAM REPORT   Patient Name:   NAOMIE DELENA HURST Date of Exam: 09/26/2023 Medical Rec #:  [id number]      Height:       60.0 in Accession #:    [id number]     Weight:       194.0 lb Date of Birth:  18-Feb-1941       BSA:          1.843 m Patient Age:    82 years       BP:           104/64 mmHg Patient Gender: F              HR:           67 bpm. Exam Location:  [name] Procedure: 2D Echo, 3D Echo, Cardiac Doppler and Color Doppler (Both Spectral            and Color Flow Doppler were utilized during procedure). Indications:    Stroke l63.9  History:        Patient has prior history of Echocardiogram examinations, most                 recent 06/28/2023. Arrythmias:Atrial Fibrillation; Risk                 Factors:Hypertension, Diabetes and Dyslipidemia. Hx of COVID-19.  Sonographer:    Aida Pizza RCS Referring Phys: [id number] VISHNU P MALLIPEDDI IMPRESSIONS  1. Left ventricular ejection fraction, by estimation,  is 30 to 35%. The left ventricle has moderately decreased function. The left ventricle demonstrates regional wall motion  abnormalities (see scoring diagram/findings for description). The left ventricular internal cavity size was mildly dilated. There is mild concentric left ventricular hypertrophy. Left ventricular diastolic parameters are indeterminate.  2. Right ventricular systolic function is normal. The right ventricular size is normal. Tricuspid regurgitation signal is inadequate for assessing PA pressure.  3. Left atrial size was moderately dilated.  4. The mitral valve is degenerative. Moderate mitral valve regurgitation.  5. The aortic valve is tricuspid. Aortic valve regurgitation is trivial. Aortic valve sclerosis is present, with no evidence of aortic valve stenosis. Aortic valve mean gradient measures 5.0 mmHg.  6. The inferior vena cava is normal in size with greater than 50% respiratory variability, suggesting right atrial pressure of 3 mmHg. Comparison(s): Prior images reviewed side by side. LVEF 30-35%, RV contraction normal. Moderate mitral regurgitation. FINDINGS  Left Ventricle: Left ventricular ejection fraction, by estimation, is 30 to 35%. The left ventricle has moderately decreased function. The left ventricle demonstrates regional wall motion abnormalities. The left ventricular internal cavity size was mildly dilated. There is mild concentric left ventricular hypertrophy. Left ventricular diastolic function could not be evaluated due to atrial fibrillation. Left ventricular diastolic parameters are indeterminate.  LV Wall Scoring: The anterior wall, mid and distal lateral wall, entire septum, inferior wall, and apex are hypokinetic. The antero-lateral wall and basal inferolateral segment are normal. Right Ventricle: The right ventricular size is normal. No increase in right ventricular wall thickness. Right ventricular systolic function is normal. Tricuspid regurgitation signal is inadequate for assessing PA pressure. Left Atrium: Left atrial size was moderately dilated. Right Atrium: Right atrial size was  normal in size. Pericardium: There is no evidence of pericardial effusion. Mitral Valve: The mitral valve is degenerative in appearance. There is mild thickening of the mitral valve leaflet(s). Moderate mitral valve regurgitation. Tricuspid Valve: The tricuspid valve is grossly normal. Tricuspid valve regurgitation is trivial. Aortic Valve: The aortic valve is tricuspid. There is moderate aortic valve annular calcification. Aortic valve regurgitation is trivial. Aortic valve sclerosis is present, with no evidence of aortic valve stenosis. Aortic valve mean gradient measures 5.0 mmHg. Aortic valve peak gradient measures 8.8 mmHg. Aortic valve area, by VTI measures 1.18 cm. Pulmonic Valve: The pulmonic valve was grossly normal. Pulmonic valve regurgitation is trivial. Aorta: The aortic root is normal in size and structure. Venous: The inferior vena cava is normal in size with greater than 50% respiratory variability, suggesting right atrial pressure of 3 mmHg. IAS/Shunts: No atrial level shunt detected by color flow Doppler. Additional Comments: 3D was performed not requiring image post processing on an independent workstation and was indeterminate.  LEFT VENTRICLE PLAX 2D LVIDd:         5.90 cm   Diastology LVIDs:         4.60 cm   LV e' medial:    3.26 cm/s LV PW:         1.20 cm   LV E/e' medial:  27.1 LV IVS:        1.10 cm   LV e' lateral:   9.79 cm/s LVOT diam:     1.60 cm   LV E/e' lateral: 9.0 LV SV:         39 LV SV Index:   21 LVOT Area:     2.01 cm  3D Volume EF:                          LV EDV:       203 ml                          LV ESV:       120 ml                          LV SV:        83 ml RIGHT VENTRICLE RV S prime:     11.40 cm/s TAPSE (M-mode): 2.5 cm LEFT ATRIUM             Index        RIGHT ATRIUM          Index LA diam:        5.10 cm 2.77 cm/m   RA Area:     9.92 cm LA Vol (A2C):   73.4 ml 39.83 ml/m  RA Volume:   20.00 ml 10.85 ml/m LA Vol (A4C):   76.2 ml  41.35 ml/m LA Biplane Vol: 75.1 ml 40.76 ml/m  AORTIC VALVE AV Area (Vmax):    1.55 cm AV Area (Vmean):   1.34 cm AV Area (VTI):     1.18 cm AV Vmax:           148.00 cm/s AV Vmean:          106.000 cm/s AV VTI:            0.330 m AV Peak Grad:      8.8 mmHg AV Mean Grad:      5.0 mmHg LVOT Vmax:         114.00 cm/s LVOT Vmean:        70.700 cm/s LVOT VTI:          0.193 m LVOT/AV VTI ratio: 0.58  AORTA Ao Root diam: 3.00 cm MITRAL VALVE MV Area (PHT): 4.31 cm       SHUNTS MV Decel Time: 176 msec       Systemic VTI:  0.19 m MR Peak grad:    78.5 mmHg    Systemic Diam: 1.60 cm MR Mean grad:    45.0 mmHg MR Vmax:         443.00 cm/s MR Vmean:        313.0 cm/s MR PISA:         1.57 cm MR PISA Eff ROA: 13 mm MR PISA Radius:  0.50 cm MV E velocity: 88.20 cm/s MV A velocity: 80.10 cm/s MV E/A ratio:  1.10 Jayson Sierras MD Electronically signed by Jayson Sierras MD Signature Date/Time: 09/26/2023/4:22:32 PM    Final     Alm Schneider, DO  [facility]  If 7PM-7AM, please contact night-coverage [email or web address] Password [email or web address] 10/23/2023, 5:25 PM   LOS: 0 days

## 2023-10-23 NOTE — Progress Notes (Addendum)
 Nurse at bedside,patient alert and oriented to person,place,and situation,some what confused to time.No c/o pain this am.Patient is out of bed to chair.Plan of care on going.Patient refused enema's this morning, Therisa Stager NP notified.

## 2023-10-23 NOTE — Progress Notes (Addendum)
 Pt drank approximately 1/3 of bowel prep without bowel movement. RN instructed pt to keep drinking. Pt stated, I can't, I'm full. RN instructed pt to keep sipping on it as soon as she felt like she was able so that she can have bowel movements to clear out her intestines for the test tomorrow (7/28). She indicated that she understood, but could not drink any more. RN continued to remind pt to drink but she declined. Also, pt had not voided from 1900-0300 and attempted on the [facility]  at 0300, but was unable. Bladder scan showed over 900 mL at 0300. Pt finally voided at 0630 on [facility]. She felt urge to void and urine output was . She did not drink any bowel prep since about 2300. No BM this shift, but is passing gas.

## 2023-10-24 DIAGNOSIS — K921 Melena: Secondary | ICD-10-CM | POA: Diagnosis not present

## 2023-10-24 DIAGNOSIS — I5022 Chronic systolic (congestive) heart failure: Secondary | ICD-10-CM | POA: Diagnosis not present

## 2023-10-24 DIAGNOSIS — F209 Schizophrenia, unspecified: Secondary | ICD-10-CM | POA: Diagnosis not present

## 2023-10-24 DIAGNOSIS — I480 Paroxysmal atrial fibrillation: Secondary | ICD-10-CM | POA: Diagnosis not present

## 2023-10-24 DIAGNOSIS — D62 Acute posthemorrhagic anemia: Secondary | ICD-10-CM | POA: Diagnosis not present

## 2023-10-24 LAB — CBC
HCT: 24.9 % — ABNORMAL LOW (ref 36.0–46.0)
Hemoglobin: 7.9 g/dL — ABNORMAL LOW (ref 12.0–15.0)
MCH: 30.2 pg (ref 26.0–34.0)
MCHC: 31.7 g/dL (ref 30.0–36.0)
MCV: 95 fL (ref 80.0–100.0)
Platelets: 227 K/uL (ref 150–400)
RBC: 2.62 MIL/uL — ABNORMAL LOW (ref 3.87–5.11)
RDW: 16.8 % — ABNORMAL HIGH (ref 11.5–15.5)
WBC: 6.4 K/uL (ref 4.0–10.5)
nRBC: 0 % (ref 0.0–0.2)

## 2023-10-24 NOTE — Progress Notes (Addendum)
 PROGRESS NOTE  Maria Pace FMW:[id number] DOB: 1941-02-16 DOA: 10/20/2023 PCP: Wilmon Penton, NP  Brief History:  83 y/o female   with medical history significant of bipolar 1 disorder, schizophrenia, essential hypertension, GERD, hyperlipidemia, history of irregular heartbeat, type 2 diabetes mellitus, PAF on apixaban , HFrEF (EF 30-35%), HTN presenting with abnormal labs. The patient is difficult historian, but able to provide some basic history.  She states that she had some labs drawn on 10/19/2023.  She thinks it may have been ordered by her primary care provider.  She was contacted to go to emergency department because of abnormal values, particularly low blood count. The patient has been complaining of generalized weakness and feeling weak in the knees for about a week.  She denies any dizziness or syncope.  She denies any fevers, chills, headache, chest pain, shortness breath, coughing, hemoptysis, nausea, vomiting, diarrhea.  There is no hematochezia. She states that she has been having melena for about a week.  She states that she did not think too much of it because she has been on iron.  She denies any abdominal pain.  She is not sure if she has been taking any NSAIDs recently. In the ED, the patient was afebrile and hemodynamically stable with oxygen saturation 97% on room air.  WBC 8.7, hemoglobin 6.6, platelets 226.  Sodium 137, potassium 4.4, bicarbonate 20, serum creatinine 1.69.  The patient deferred rectal exam in the ED. 1 unit PRBC was ordered. GI was consulted and will consult after admission.  GI initially plan for EGD and colonoscopy.  The patient initially agreed and signed consent.  The patient subsequently had some difficulty tolerating the colonoscopy preparation.  Out of her frustration, the patient decided that she did not want any further interventions and refused further EGD or colonoscopy.  The risks, benefits, and alternatives were discussed with the  patient.  It was discussed with the patient that it was not safe to restart apixaban  without further clarifying source of bleeding.  Therefore, the patient will be discharged home with aspirin  in lieu of apixaban .  In the morning 10/24/23, patient decided she was agreeable to EGD.  GI plans for EGD on 10/25/23.  She continues to refuse colonoscopy.   Assessment/Plan: Symptomatic anemia/acute blood loss anemia -Patient presented with hemoglobin 6.6 - Transfused 1 unit PRBC 10/20/2023 - Baseline hemoglobin~10 - Patient endorses intermittent melena - Iron saturation 7, ferritin 10, folate >40, B12--2177 - Clear liquid diet - continue pantoprazole  - Holding apixaban  since day of admission - discussed with GI, Dr. Cindie 7/26 - Planning for EGD and colonoscopy on 10/23/2023  - 10/23/23--pt had difficulty tolerating Nulytely --now refusing all procedures - 10/23/23--discussed with patient that I cannot safely restart apixaban  if she refuses procedures so apixaban  will Not be restarted--she expresses understanding - 10/24/23--pt changed mind and now willing to undergo EGD>>planning for 10/25/23 - If EGD is negative, may consider restart apixaban  and monitor for signs of active bleed and recheck CBC in a week after d/c   Chronic HFrEF - 09/26/2023 echo EF 30-35%, normal RVF, moderate MR - Patient had some signs of fluid overload - Furosemide  40 mg IV given in the ED - Review of the [facility] record shows that the patient is not on any furosemide  at home - Continue Entresto  - Holding spironolactone  and metoprolol  secondary to soft blood pressures   Paroxysmal atrial fibrillation - Currently in sinus rhythm - Holding apixaban  - Holding metoprolol  secondary  to soft blood pressure - Continue amiodarone  -  event monitor from 5/24 showed 408 SVT runs, some of them are atrial fibrillation. Event monitor from February 2025 did not reveal any evidence of atrial fibrillation but significant PAC burden was noted  although she continues to be asymptomatic    Schizophrenia, unspecified type - Continue olanzapine  - Continue Depakote    CKD stage IIIb - Baseline creatinine 1.3-1.5 - Monitor BMP   Diabetes mellitus type 2, controlled - 10/21/23 hemoglobin A1c--5.2 - NovoLog  sliding scale - Holding metformin   Mixed hyperlipidemia - Continue statin             Family Communication:   Caretaker updated 7/28   Consultants:  GI   Code Status:  FULL   DVT Prophylaxis:  SCDs     Procedures: As Listed in Progress Note Above   Antibiotics: None          Subjective: Patient denies fevers, chills, headache, chest pain, dyspnea, nausea, vomiting, diarrhea, abdominal pain, dysuria, hematuria, hematochezia, and melena.   Objective: Vitals:   10/23/23 2219 10/24/23 0541 10/24/23 0915 10/24/23 1437  BP: 103/61 (!) 105/50 (!) 95/55 127/63  Pulse: 70 99 77 63  Resp: 20 20 19 18   Temp: 97.6 F (36.4 C) 98.4 F (36.9 C) 97.8 F (36.6 C) (!) 97.5 F (36.4 C)  TempSrc: Oral Oral Oral Oral  SpO2: 100% 98% 99% 97%  Weight:      Height:        Intake/Output Summary (Last 24 hours) at 10/24/2023 1740 Last data filed at 10/24/2023 1254 Gross per 24 hour  Intake 1078 ml  Output 500 ml  Net 578 ml   Weight change:  Exam:  General:  Pt is alert, follows commands appropriately, not in acute distress HEENT: No icterus, No thrush, No neck mass, [place]/AT Cardiovascular: RRR, S1/S2, no rubs, no gallops Respiratory: CTA bilaterally, no wheezing, no crackles, no rhonchi Abdomen: Soft/+BS, non tender, non distended, no guarding Extremities: trace LE edema, No lymphangitis, No petechiae, No rashes, no synovitis   Data Reviewed: I have personally reviewed following labs and imaging studies Basic Metabolic Panel: Recent Labs  Lab 10/20/23 1507 10/21/23 0444 10/22/23 0422  NA 137 141 136  K 4.4 4.1 4.4  CL 105 106 101  CO2 20* 24 24  GLUCOSE 129* 72 61*  BUN 29* 28* 21   CREATININE 1.69* 1.54* 1.45*  CALCIUM  8.4* 8.7* 8.3*   Liver Function Tests: No results for input(s): AST, ALT, ALKPHOS, BILITOT, PROT, ALBUMIN in the last 168 hours. No results for input(s): LIPASE, AMYLASE in the last 168 hours. No results for input(s): AMMONIA in the last 168 hours. Coagulation Profile: No results for input(s): INR, PROTIME in the last 168 hours. CBC: Recent Labs  Lab 10/20/23 1507 10/20/23 2108 10/21/23 0444 10/22/23 0422 10/23/23 0501 10/24/23 0449  WBC 8.7  --  7.9 7.7 9.8 6.4  NEUTROABS 6.0  --   --   --   --   --   HGB 6.6* 8.8* 8.7* 7.9* 8.9* 7.9*  HCT 21.1* 27.8* 27.7* 24.6* 27.5* 24.9*  MCV 98.1  --  97.2 94.3 92.9 95.0  PLT 240  --  187 235 275 227   Cardiac Enzymes: No results for input(s): CKTOTAL, CKMB, CKMBINDEX, TROPONINI in the last 168 hours. BNP: Invalid input(s): POCBNP CBG: Recent Labs  Lab 10/22/23 1903 10/22/23 2027 10/23/23 0720 10/23/23 1113 10/23/23 1554  GLUCAP 160* 118* 79 118* 111*   HbA1C: No  results for input(s): HGBA1C in the last 72 hours. Urine analysis:    Component Value Date/Time   COLORURINE YELLOW 07/12/2022 0924   APPEARANCEUR CLEAR 07/12/2022 0924   APPEARANCEUR Clear 01/06/2022 1100   LABSPEC 1.011 07/12/2022 0924   LABSPEC 1.005 10/28/2013 0457   PHURINE 7.0 07/12/2022 0924   GLUCOSEU NEGATIVE 07/12/2022 0924   GLUCOSEU 50 mg/dL 91/96/7984 9542   HGBUR NEGATIVE 07/12/2022 0924   BILIRUBINUR NEGATIVE 07/12/2022 0924   BILIRUBINUR Negative 01/06/2022 1100   BILIRUBINUR Negative 10/28/2013 0457   KETONESUR NEGATIVE 07/12/2022 0924   PROTEINUR NEGATIVE 07/12/2022 0924   NITRITE NEGATIVE 07/12/2022 0924   LEUKOCYTESUR SMALL (A) 07/12/2022 0924   LEUKOCYTESUR 3+ 10/28/2013 0457   Sepsis Labs: @LABRCNTIP (procalcitonin:4,lacticidven:4) ) Recent Results (from the past 240 hours)  MRSA Next Gen by PCR, Nasal     Status: None   Collection Time: 10/20/23  7:35 PM    Specimen: Nasal Mucosa; Nasal Swab  Result Value Ref Range Status   MRSA by PCR Next Gen NOT DETECTED NOT DETECTED Final    Comment: (NOTE) The GeneXpert MRSA Assay (FDA approved for NASAL specimens only), is one component of a comprehensive MRSA colonization surveillance program. It is not intended to diagnose MRSA infection nor to guide or monitor treatment for MRSA infections. Test performance is not FDA approved in patients less than 19 years old. Performed at [facility], [address] [place], [place] [address]      Scheduled Meds:  amiodarone   200 mg Oral Daily   ascorbic acid   500 mg Oral Daily   atorvastatin   20 mg Oral Daily   Chlorhexidine  Gluconate Cloth  6 each Topical Daily   cholecalciferol   1,000 Units Oral Daily   divalproex   250 mg Oral Daily   ferrous sulfate   325 mg Oral BID WC   gabapentin   400 mg Oral QHS   latanoprost   1 drop Both Eyes QHS   loratadine   10 mg Oral Daily   multivitamin with minerals  1 tablet Oral Daily   OLANZapine   20 mg Oral QHS   OLANZapine   5 mg Oral QHS   pantoprazole  (PROTONIX ) IV  40 mg Intravenous Q12H   pantoprazole   80 mg Intravenous Once   sacubitril -valsartan   1 tablet Oral BID   senna-docusate  2 tablet Oral QHS   cyanocobalamin   500 mcg Oral Daily   Continuous Infusions:  sodium chloride  Stopped (10/21/23 0536)    Procedures/Studies: DG Chest Portable 1 View Result Date: 10/20/2023 CLINICAL DATA:  Weakness. EXAM: PORTABLE CHEST 1 VIEW COMPARISON:  07/12/2022. FINDINGS: Low lung volumes with bronchovascular crowding. Cardiomegaly. No focal consolidation, pleural effusion, or pneumothorax. No acute osseous abnormality. IMPRESSION: 1. Low lung volumes.  No acute cardiopulmonary findings. 2. Cardiomegaly. Electronically Signed   By: Harrietta Sherry M.D.   On: 10/20/2023 17:12   ECHOCARDIOGRAM COMPLETE Result Date: 09/26/2023    ECHOCARDIOGRAM REPORT   Patient Name:   Maria Pace Date of Exam: 09/26/2023 Medical Rec #:   [id number]      Height:       60.0 in Accession #:    [id number]     Weight:       194.0 lb Date of Birth:  June 12, 1940       BSA:          1.843 m Patient Age:    82 years       BP:           104/64 mmHg Patient  Gender: F              HR:           67 bpm. Exam Location:  [name] Procedure: 2D Echo, 3D Echo, Cardiac Doppler and Color Doppler (Both Spectral            and Color Flow Doppler were utilized during procedure). Indications:    Stroke l63.9  History:        Patient has prior history of Echocardiogram examinations, most                 recent 06/28/2023. Arrythmias:Atrial Fibrillation; Risk                 Factors:Hypertension, Diabetes and Dyslipidemia. Hx of COVID-19.  Sonographer:    Aida Pizza RCS Referring Phys: [id number] VISHNU P MALLIPEDDI IMPRESSIONS  1. Left ventricular ejection fraction, by estimation, is 30 to 35%. The left ventricle has moderately decreased function. The left ventricle demonstrates regional wall motion abnormalities (see scoring diagram/findings for description). The left ventricular internal cavity size was mildly dilated. There is mild concentric left ventricular hypertrophy. Left ventricular diastolic parameters are indeterminate.  2. Right ventricular systolic function is normal. The right ventricular size is normal. Tricuspid regurgitation signal is inadequate for assessing PA pressure.  3. Left atrial size was moderately dilated.  4. The mitral valve is degenerative. Moderate mitral valve regurgitation.  5. The aortic valve is tricuspid. Aortic valve regurgitation is trivial. Aortic valve sclerosis is present, with no evidence of aortic valve stenosis. Aortic valve mean gradient measures 5.0 mmHg.  6. The inferior vena cava is normal in size with greater than 50% respiratory variability, suggesting right atrial pressure of 3 mmHg. Comparison(s): Prior images reviewed side by side. LVEF 30-35%, RV contraction normal. Moderate mitral regurgitation. FINDINGS  Left Ventricle:  Left ventricular ejection fraction, by estimation, is 30 to 35%. The left ventricle has moderately decreased function. The left ventricle demonstrates regional wall motion abnormalities. The left ventricular internal cavity size was mildly dilated. There is mild concentric left ventricular hypertrophy. Left ventricular diastolic function could not be evaluated due to atrial fibrillation. Left ventricular diastolic parameters are indeterminate.  LV Wall Scoring: The anterior wall, mid and distal lateral wall, entire septum, inferior wall, and apex are hypokinetic. The antero-lateral wall and basal inferolateral segment are normal. Right Ventricle: The right ventricular size is normal. No increase in right ventricular wall thickness. Right ventricular systolic function is normal. Tricuspid regurgitation signal is inadequate for assessing PA pressure. Left Atrium: Left atrial size was moderately dilated. Right Atrium: Right atrial size was normal in size. Pericardium: There is no evidence of pericardial effusion. Mitral Valve: The mitral valve is degenerative in appearance. There is mild thickening of the mitral valve leaflet(s). Moderate mitral valve regurgitation. Tricuspid Valve: The tricuspid valve is grossly normal. Tricuspid valve regurgitation is trivial. Aortic Valve: The aortic valve is tricuspid. There is moderate aortic valve annular calcification. Aortic valve regurgitation is trivial. Aortic valve sclerosis is present, with no evidence of aortic valve stenosis. Aortic valve mean gradient measures 5.0 mmHg. Aortic valve peak gradient measures 8.8 mmHg. Aortic valve area, by VTI measures 1.18 cm. Pulmonic Valve: The pulmonic valve was grossly normal. Pulmonic valve regurgitation is trivial. Aorta: The aortic root is normal in size and structure. Venous: The inferior vena cava is normal in size with greater than 50% respiratory variability, suggesting right atrial pressure of 3 mmHg. IAS/Shunts: No atrial  level shunt detected by  color flow Doppler. Additional Comments: 3D was performed not requiring image post processing on an independent workstation and was indeterminate.  LEFT VENTRICLE PLAX 2D LVIDd:         5.90 cm   Diastology LVIDs:         4.60 cm   LV e' medial:    3.26 cm/s LV PW:         1.20 cm   LV E/e' medial:  27.1 LV IVS:        1.10 cm   LV e' lateral:   9.79 cm/s LVOT diam:     1.60 cm   LV E/e' lateral: 9.0 LV SV:         39 LV SV Index:   21 LVOT Area:     2.01 cm                           3D Volume EF:                          LV EDV:       203 ml                          LV ESV:       120 ml                          LV SV:        83 ml RIGHT VENTRICLE RV S prime:     11.40 cm/s TAPSE (M-mode): 2.5 cm LEFT ATRIUM             Index        RIGHT ATRIUM          Index LA diam:        5.10 cm 2.77 cm/m   RA Area:     9.92 cm LA Vol (A2C):   73.4 ml 39.83 ml/m  RA Volume:   20.00 ml 10.85 ml/m LA Vol (A4C):   76.2 ml 41.35 ml/m LA Biplane Vol: 75.1 ml 40.76 ml/m  AORTIC VALVE AV Area (Vmax):    1.55 cm AV Area (Vmean):   1.34 cm AV Area (VTI):     1.18 cm AV Vmax:           148.00 cm/s AV Vmean:          106.000 cm/s AV VTI:            0.330 m AV Peak Grad:      8.8 mmHg AV Mean Grad:      5.0 mmHg LVOT Vmax:         114.00 cm/s LVOT Vmean:        70.700 cm/s LVOT VTI:          0.193 m LVOT/AV VTI ratio: 0.58  AORTA Ao Root diam: 3.00 cm MITRAL VALVE MV Area (PHT): 4.31 cm       SHUNTS MV Decel Time: 176 msec       Systemic VTI:  0.19 m MR Peak grad:    78.5 mmHg    Systemic Diam: 1.60 cm MR Mean grad:    45.0 mmHg MR Vmax:         443.00 cm/s MR Vmean:        313.0 cm/s MR PISA:  1.57 cm MR PISA Eff ROA: 13 mm MR PISA Radius:  0.50 cm MV E velocity: 88.20 cm/s MV A velocity: 80.10 cm/s MV E/A ratio:  1.10 Jayson Sierras MD Electronically signed by Jayson Sierras MD Signature Date/Time: 09/26/2023/4:22:32 PM    Final     Alm Schneider, DO  [facility]  If 7PM-7AM, please  contact night-coverage [email or web address] Password [email or web address] 10/24/2023, 5:40 PM   LOS: 0 days

## 2023-10-24 NOTE — Plan of Care (Signed)

## 2023-10-24 NOTE — Progress Notes (Signed)
 Mobility Specialist Progress Note:    10/24/23 1046  Mobility  Activity Pivoted/transferred from bed to chair;Ambulated with assistance  Level of Assistance Minimal assist, patient does 75% or more  Assistive Device Front wheel walker  Distance Ambulated (ft) 5 ft  Range of Motion/Exercises Active;All extremities  Activity Response Tolerated well  Mobility Referral Yes  Mobility visit 1 Mobility  Mobility Specialist Start Time (ACUTE ONLY) 1020  Mobility Specialist Stop Time (ACUTE ONLY) 1046  Mobility Specialist Time Calculation (min) (ACUTE ONLY) 26 min   Pt received in bed, agreeable to mobility. Required MinA to stand and ambulate with RW. Tolerated well, asx throughout. Alarm on, call bell in hand. All needs met.   Sherrilee Ditty Mobility Specialist Please contact via Special educational needs teacher or  Rehab office at [phone]

## 2023-10-24 NOTE — Plan of Care (Signed)
   Problem: Education: Goal: Knowledge of General Education information will improve Description Including pain rating scale, medication(s)/side effects and non-pharmacologic comfort measures Outcome: Progressing   Problem: Education: Goal: Knowledge of General Education information will improve Description Including pain rating scale, medication(s)/side effects and non-pharmacologic comfort measures Outcome: Progressing

## 2023-10-24 NOTE — Progress Notes (Signed)
    Subjective: No concerns for me today. Denies abdominal pain, nausea, or vomiting. Again states she will not do a colonoscopy, but is agreeable to EGD.   Nurse reports no overt GI  bleeding yesterday and non reported overnight.   Objective: Vital signs in last 24 hours: Temp:  [97.6 F (36.4 C)-98.4 F (36.9 C)] 98.4 F (36.9 C) (07/29 0541) Pulse Rate:  [70-99] 99 (07/29 0541) Resp:  [20] 20 (07/29 0541) BP: (94-105)/(50-71) 105/50 (07/29 0541) SpO2:  [94 %-100 %] 98 % (07/29 0541) Last BM Date :  (unknown) General:   Alert and oriented, pleasant, NAD.  Head:  Normocephalic and atraumatic. Eyes:  No icterus, sclera clear. Conjuctiva pink.  Abdomen:  Bowel sounds present, obese, full, soft, non-tender. No rebound or guarding. No masses appreciated  Msk:  Symmetrical without gross deformities. Normal posture. Pulses:  Normal pulses noted. Neurologic:  Alert and  oriented x4;  grossly normal neurologically. Skin:  Warm and dry, intact without significant lesions.  Psych:  Normal mood and affect.  Intake/Output from previous day: 07/28 0701 - 07/29 0700 In: 1731.2 [P.O.:1440; I.V.:291.2] Out: 500 [Urine:500] Intake/Output this shift: Total I/O In: 358 [P.O.:358] Out: -   Lab Results: Recent Labs    10/22/23 0422 10/23/23 0501 10/24/23 0449  WBC 7.7 9.8 6.4  HGB 7.9* 8.9* 7.9*  HCT 24.6* 27.5* 24.9*  PLT 235 275 227   BMET Recent Labs    10/22/23 0422  NA 136  K 4.4  CL 101  CO2 24  GLUCOSE 61*  BUN 21  CREATININE 1.45*  CALCIUM  8.3*     Assessment: 83 y.o. female with a history of bipolar disorder, schizophrenia, GERD, dyslipidemia, paroxysmal atrial fibrillation on apixaban , CHF, HTN, resides at [facility] facility, admitted with symptomatic anemia, possible melena, heme positive, with GI consulted for further evaluation.   Symptomatic anemia:  Hgb 6.6 on admission with heme positive stool. Prior baseline Hgb 9/10 range. Evidence for IDA  with ferritin markedly low at 10, receiving 1 unit PRBCs with improvement in Hgb to 8 range. Hgb 8.9 yesterday, but back down to 7.9 today. No reports of overt GI bleeding during admission. Prior reports of dark stools but had also been on iron.  Had recommended EGD and colonoscopy, but patient did not tolerate bowel prep well and has declined any further attempt to prep for colonoscopy despite offering MiraLAX  prep and states she will not have a colonoscopy. Also declined EGD yesterday, but now agreeable. Unfortunately, she had a regular diet this morning, so procedure will have to be postponed to tomorrow. Eliquis  remains on hold.   Plan: Continue regular diet today.  NPO at midnight.  EGD with Dr. Cindie tomorrow. The risks, benefits, and alternatives have been discussed with the patient in detail. The patient states understanding and desires to proceed.  Continue PPI BID Continue to monitor H/H and for overt GI bleeding. Transfuse as necessary.  Continue to hold Eliquis .    LOS: 0 days    10/24/2023, 9:11 AM   [facility], [facility]

## 2023-10-25 ENCOUNTER — Observation Stay (HOSPITAL_COMMUNITY): Admitting: Anesthesiology

## 2023-10-25 ENCOUNTER — Encounter (HOSPITAL_COMMUNITY)
Admission: EM | Disposition: A | Discharge status: Home / Self Care | Payer: Self-pay | Source: Skilled Nursing Facility | Attending: Emergency Medicine

## 2023-10-25 ENCOUNTER — Encounter (HOSPITAL_COMMUNITY): Payer: Self-pay | Admitting: Internal Medicine

## 2023-10-25 DIAGNOSIS — D62 Acute posthemorrhagic anemia: Secondary | ICD-10-CM | POA: Diagnosis not present

## 2023-10-25 LAB — CBC
HCT: 27 % — ABNORMAL LOW (ref 36.0–46.0)
Hemoglobin: 8.7 g/dL — ABNORMAL LOW (ref 12.0–15.0)
MCH: 30.4 pg (ref 26.0–34.0)
MCHC: 32.2 g/dL (ref 30.0–36.0)
MCV: 94.4 fL (ref 80.0–100.0)
Platelets: 252 K/uL (ref 150–400)
RBC: 2.86 MIL/uL — ABNORMAL LOW (ref 3.87–5.11)
RDW: 16.3 % — ABNORMAL HIGH (ref 11.5–15.5)
WBC: 7.9 K/uL (ref 4.0–10.5)
nRBC: 0 % (ref 0.0–0.2)

## 2023-10-25 LAB — BASIC METABOLIC PANEL WITH GFR
Anion gap: 10 (ref 5–15)
BUN: 18 mg/dL (ref 8–23)
CO2: 23 mmol/L (ref 22–32)
Calcium: 8.5 mg/dL — ABNORMAL LOW (ref 8.9–10.3)
Chloride: 97 mmol/L — ABNORMAL LOW (ref 98–111)
Creatinine, Ser: 1.74 mg/dL — ABNORMAL HIGH (ref 0.44–1.00)
GFR, Estimated: 29 mL/min — ABNORMAL LOW (ref >60)
Glucose, Bld: 89 mg/dL (ref 70–99)
Potassium: 4.9 mmol/L (ref 3.5–5.1)
Sodium: 130 mmol/L — ABNORMAL LOW (ref 135–145)

## 2023-10-25 LAB — SODIUM, URINE, RANDOM: Sodium, Ur: 27 mmol/L

## 2023-10-25 LAB — MAGNESIUM: Magnesium: 1.9 mg/dL (ref 1.7–2.4)

## 2023-10-25 LAB — OSMOLALITY, URINE: Osmolality, Ur: 178 mosm/kg — ABNORMAL LOW (ref 300–900)

## 2023-10-25 SURGERY — CANCELLED PROCEDURE
Anesthesia: General

## 2023-10-25 MED ORDER — SODIUM CHLORIDE 0.9 % IV SOLN: INTRAVENOUS | Status: DC

## 2023-10-25 MED ORDER — FENTANYL CITRATE (PF) 250 MCG/5ML IJ SOLN: INTRAMUSCULAR | Status: DC | PRN

## 2023-10-25 NOTE — Progress Notes (Signed)
 Went to get patient for her EGD.  Patient states: Nope, Nope, I'm not going anywhere.  Reminded patient of her procedure and that she signed her consent this morning.  Patients nurse, Sondra also came to the bedside to speak to patient about having the procedure.  Patient still refusing to go downstairs.  Will notify Endo.

## 2023-10-25 NOTE — Progress Notes (Signed)
 PROGRESS NOTE    Maria Pace  FMW:[id number] DOB: December 21, 1940 DOA: 10/20/2023 PCP: Wilmon Penton, NP   Brief Narrative:    83 y/o female   with medical history significant of bipolar 1 disorder, schizophrenia, essential hypertension, GERD, hyperlipidemia, history of irregular heartbeat, type 2 diabetes mellitus, PAF on apixaban , HFrEF (EF 30-35%), HTN presenting with abnormal labs. She was contacted to go to emergency department because of abnormal values, particularly low blood count. The patient has been complaining of generalized weakness and feeling weak in the knees for about a week.   GI was consulted and will consult after admission.  GI initially plan for EGD and colonoscopy.  The patient initially agreed and signed consent.  The patient subsequently had some difficulty tolerating the colonoscopy preparation.  Out of her frustration, the patient decided that she did not want any further interventions and refused further EGD or colonoscopy.  The risks, benefits, and alternatives were discussed with the patient.  It was discussed with the patient that it was not safe to restart apixaban  without further clarifying source of bleeding.  Therefore, the patient will be discharged home with aspirin  in lieu of apixaban .   -Patient has refused EGD 7/30.  Now noted to have some hyponatremia with AKI.  Assessment & Plan:   Principal Problem:   Acute blood loss anemia Active Problems:   Schizophrenia (HCC)   PAF (paroxysmal atrial fibrillation) (HCC)   Chronic HFrEF (heart failure with reduced ejection fraction) (HCC)   Symptomatic anemia   Melena   Chronic anticoagulation  Assessment and Plan:  Symptomatic anemia/acute blood loss anemia -Patient presented with hemoglobin 6.6 - Transfused 1 unit PRBC 10/20/2023 - Baseline hemoglobin~10 - Patient endorses intermittent melena - Iron saturation 7, ferritin 10, folate >40, B12--2177 - Clear liquid diet - continue pantoprazole  -  Holding apixaban  since day of admission - discussed with GI, Dr. Cindie 7/26 - Planning for EGD and colonoscopy on 10/23/2023  - 10/23/23--pt had difficulty tolerating Nulytely --now refusing all procedures - 10/23/23--discussed with patient that I cannot safely restart apixaban  if she refuses procedures so apixaban  will Not be restarted--she expresses understanding - 10/24/23--pt changed mind and now willing to undergo EGD>>planning for 10/25/23 - If EGD is negative, may consider restart apixaban  and monitor for signs of active bleed and recheck CBC in a week after d/c - Declined EGD on 7/30 and patient will therefore need to remain off of anticoagulation and follow-up hemoglobin levels outpatient which have thankfully remained stable.  Diet resumed.   Hyponatremia with creatinine elevation - Start IV fluid with normal saline - Check urine and serum osmolarity as well as TSH and urine sodium - Recheck labs in a.m. and if improved anticipate discharge at that time  Chronic HFrEF - 09/26/2023 echo EF 30-35%, normal RVF, moderate MR - Patient had some signs of fluid overload - Furosemide  40 mg IV given in the ED - Review of the [employer] record shows that the patient is not on any furosemide  at home - Continue Entresto  - Holding spironolactone  and metoprolol  secondary to soft blood pressures   Paroxysmal atrial fibrillation - Currently in sinus rhythm - Holding apixaban  - Holding metoprolol  secondary to soft blood pressure - Continue amiodarone  -  event monitor from 5/24 showed 408 SVT runs, some of them are atrial fibrillation. Event monitor from February 2025 did not reveal any evidence of atrial fibrillation but significant PAC burden was noted although she continues to be asymptomatic    Schizophrenia, unspecified type -  Continue olanzapine  - Continue Depakote    CKD stage IIIb - Baseline creatinine 1.3-1.5, currently 1.74 - Monitor BMP   Diabetes mellitus type 2, controlled - 10/21/23  hemoglobin A1c--5.2 - NovoLog  sliding scale - Holding metformin   Mixed hyperlipidemia - Continue statin  Obesity, class II - BMI 37.89   DVT prophylaxis: SCDs Code Status: Full Family Communication: None at bedside Disposition Plan: Anticipate discharge to group home in a.m. Status is: Observation The patient will require care spanning > 2 midnights and should be moved to inpatient because: Requiring IV normal saline for hyponatremia and AKI.   Consultants:  GI  Procedures:  None  Antimicrobials:  None   Subjective: Patient seen and evaluated today and appeared agreeable for EGD this morning, but then later on refused.  Denies any symptomatic complaints or concerns this morning.  Objective: Vitals:   10/24/23 0915 10/24/23 1437 10/24/23 2044 10/25/23 0432  BP: (!) 95/55 127/63 97/70 (!) 105/52  Pulse: 77 63 73 69  Resp: 19 18 18 18   Temp: 97.8 F (36.6 C) (!) 97.5 F (36.4 C) 98.2 F (36.8 C) 98.3 F (36.8 C)  TempSrc: Oral Oral Oral Oral  SpO2: 99% 97% 98% 97%  Weight:      Height:        Intake/Output Summary (Last 24 hours) at 10/25/2023 1340 Last data filed at 10/25/2023 1200 Gross per 24 hour  Intake 534.81 ml  Output --  Net 534.81 ml   Filed Weights   10/20/23 1449  Weight: 88 kg    Examination:  General exam: Appears calm and comfortable  Respiratory system: Clear to auscultation. Respiratory effort normal. Cardiovascular system: S1 & S2 heard, RRR.  Gastrointestinal system: Abdomen is soft Central nervous system: Alert and awake Extremities: No edema Skin: No significant lesions noted Psychiatry: Flat affect.    Data Reviewed: I have personally reviewed following labs and imaging studies  CBC: Recent Labs  Lab 10/20/23 1507 10/20/23 2108 10/21/23 0444 10/22/23 0422 10/23/23 0501 10/24/23 0449 10/25/23 0805  WBC 8.7  --  7.9 7.7 9.8 6.4 7.9  NEUTROABS 6.0  --   --   --   --   --   --   HGB 6.6*   < > 8.7* 7.9* 8.9* 7.9*  8.7*  HCT 21.1*   < > 27.7* 24.6* 27.5* 24.9* 27.0*  MCV 98.1  --  97.2 94.3 92.9 95.0 94.4  PLT 240  --  187 235 275 227 252   < > = values in this interval not displayed.   Basic Metabolic Panel: Recent Labs  Lab 10/20/23 1507 10/21/23 0444 10/22/23 0422 10/25/23 0805  NA 137 141 136 130*  K 4.4 4.1 4.4 4.9  CL 105 106 101 97*  CO2 20* 24 24 23   GLUCOSE 129* 72 61* 89  BUN 29* 28* 21 18  CREATININE 1.69* 1.54* 1.45* 1.74*  CALCIUM  8.4* 8.7* 8.3* 8.5*  MG  --   --   --  1.9   GFR: Estimated Creatinine Clearance: 25.4 mL/min (A) (by C-G formula based on SCr of 1.74 mg/dL (H)). Liver Function Tests: No results for input(s): AST, ALT, ALKPHOS, BILITOT, PROT, ALBUMIN in the last 168 hours. No results for input(s): LIPASE, AMYLASE in the last 168 hours. No results for input(s): AMMONIA in the last 168 hours. Coagulation Profile: No results for input(s): INR, PROTIME in the last 168 hours. Cardiac Enzymes: No results for input(s): CKTOTAL, CKMB, CKMBINDEX, TROPONINI in the last 168 hours.  BNP (last 3 results) No results for input(s): PROBNP in the last 8760 hours. HbA1C: No results for input(s): HGBA1C in the last 72 hours. CBG: Recent Labs  Lab 10/22/23 1903 10/22/23 2027 10/23/23 0720 10/23/23 1113 10/23/23 1554  GLUCAP 160* 118* 79 118* 111*   Lipid Profile: No results for input(s): CHOL, HDL, LDLCALC, TRIG, CHOLHDL, LDLDIRECT in the last 72 hours. Thyroid  Function Tests: No results for input(s): TSH, T4TOTAL, FREET4, T3FREE, THYROIDAB in the last 72 hours. Anemia Panel: No results for input(s): VITAMINB12, FOLATE, FERRITIN, TIBC, IRON, RETICCTPCT in the last 72 hours. Sepsis Labs: No results for input(s): PROCALCITON, LATICACIDVEN in the last 168 hours.  Recent Results (from the past 240 hours)  MRSA Next Gen by PCR, Nasal     Status: None   Collection Time: 10/20/23  7:35 PM   Specimen:  Nasal Mucosa; Nasal Swab  Result Value Ref Range Status   MRSA by PCR Next Gen NOT DETECTED NOT DETECTED Final    Comment: (NOTE) The GeneXpert MRSA Assay (FDA approved for NASAL specimens only), is one component of a comprehensive MRSA colonization surveillance program. It is not intended to diagnose MRSA infection nor to guide or monitor treatment for MRSA infections. Test performance is not FDA approved in patients less than 30 years old. Performed at [facility], [address], [place], [place] [address]          Radiology Studies: No results found.      Scheduled Meds:  amiodarone   200 mg Oral Daily   ascorbic acid   500 mg Oral Daily   atorvastatin   20 mg Oral Daily   cholecalciferol   1,000 Units Oral Daily   divalproex   250 mg Oral Daily   ferrous sulfate   325 mg Oral BID WC   gabapentin   400 mg Oral QHS   latanoprost   1 drop Both Eyes QHS   loratadine   10 mg Oral Daily   multivitamin with minerals  1 tablet Oral Daily   OLANZapine   20 mg Oral QHS   OLANZapine   5 mg Oral QHS   pantoprazole  (PROTONIX ) IV  40 mg Intravenous Q12H   pantoprazole   80 mg Intravenous Once   sacubitril -valsartan   1 tablet Oral BID   senna-docusate  2 tablet Oral QHS   cyanocobalamin   500 mcg Oral Daily   Continuous Infusions:  sodium chloride  75 mL/hr at 10/25/23 1200   sodium chloride  Stopped (10/21/23 0536)     LOS: 0 days    Time spent: 55 minutes    Jeramy Dimmick JONETTA Fairly, DO [facility]  If 7PM-7AM, please contact night-coverage [email or web address] 10/25/2023, 1:40 PM

## 2023-10-25 NOTE — Plan of Care (Signed)

## 2023-10-25 NOTE — Progress Notes (Signed)
 Patient scheduled for EGD today for further evaluation of her IDA, she had been previously recommended to undergo bidirectional endoscopic evaluation, though had previously declined to undergo Colonoscopy. Upon Endo Staff attempting to take patient down to Endo,  patient refused to proceed with her EGD. Fortunately her hemoglobin is stable today at 8.7, up from 7.9 yesterday. Dr. Cindie discussed case with Dr. Maree, patient can have regular diet. At this time, GI will sign off at this time.   Cortez Flippen L. Teshia Mahone, MSN, APRN, AGNP-C Adult-Gerontology Nurse Practitioner [facility]

## 2023-10-25 NOTE — Progress Notes (Signed)
 Patient's Cardiac Monitoring order expiring, reached out to MD Maree to review if cardiac monitoring still required.  MD Maree advised okay to discontinue cardiac monitoring.

## 2023-10-25 NOTE — Progress Notes (Signed)
 MD Maree notified that patient is refusing lab draw at this time. Patient was fine with lab draw earlier in shift but is now refusing.    MD Maree advised okay to retime labs to correlate with morning labs on 7/31.

## 2023-10-25 NOTE — Anesthesia Preprocedure Evaluation (Signed)
 Anesthesia Evaluation  Patient identified by MRN, date of birth, ID band Patient awake    Reviewed: Allergy & Precautions, H&P , NPO status , Patient's Chart, lab work & pertinent test results, reviewed documented beta blocker date and time   Airway Mallampati: II  TM Distance: >3 FB Neck ROM: full    Dental no notable dental hx.    Pulmonary neg pulmonary ROS   Pulmonary exam normal breath sounds clear to auscultation       Cardiovascular Exercise Tolerance: Good hypertension, +CHF  negative cardio ROS  Rhythm:regular Rate:Normal     Neuro/Psych  PSYCHIATRIC DISORDERS   Bipolar Disorder Schizophrenia  negative neurological ROS  negative psych ROS   GI/Hepatic negative GI ROS, Neg liver ROS,GERD  ,,  Endo/Other  negative endocrine ROSdiabetes    Renal/GU Renal diseasenegative Renal ROS  negative genitourinary   Musculoskeletal   Abdominal   Peds  Hematology negative hematology ROS (+) Blood dyscrasia, anemia   Anesthesia Other Findings   Reproductive/Obstetrics negative OB ROS                              Anesthesia Physical Anesthesia Plan  ASA: 3  Anesthesia Plan: General   Post-op Pain Management:    Induction:   PONV Risk Score and Plan: Propofol infusion  Airway Management Planned:   Additional Equipment:   Intra-op Plan:   Post-operative Plan:   Informed Consent: I have reviewed the patients History and Physical, chart, labs and discussed the procedure including the risks, benefits and alternatives for the proposed anesthesia with the patient or authorized representative who has indicated his/her understanding and acceptance.     Dental Advisory Given  Plan Discussed with: CRNA  Anesthesia Plan Comments:         Anesthesia Quick Evaluation

## 2023-10-26 DIAGNOSIS — D62 Acute posthemorrhagic anemia: Secondary | ICD-10-CM | POA: Diagnosis not present

## 2023-10-26 LAB — BASIC METABOLIC PANEL WITH GFR
Anion gap: 8 (ref 5–15)
BUN: 20 mg/dL (ref 8–23)
CO2: 23 mmol/L (ref 22–32)
Calcium: 8.2 mg/dL — ABNORMAL LOW (ref 8.9–10.3)
Chloride: 100 mmol/L (ref 98–111)
Creatinine, Ser: 1.74 mg/dL — ABNORMAL HIGH (ref 0.44–1.00)
GFR, Estimated: 29 mL/min — ABNORMAL LOW (ref >60)
Glucose, Bld: 82 mg/dL (ref 70–99)
Potassium: 4.4 mmol/L (ref 3.5–5.1)
Sodium: 131 mmol/L — ABNORMAL LOW (ref 135–145)

## 2023-10-26 LAB — CBC
HCT: 25.1 % — ABNORMAL LOW (ref 36.0–46.0)
Hemoglobin: 7.9 g/dL — ABNORMAL LOW (ref 12.0–15.0)
MCH: 30.2 pg (ref 26.0–34.0)
MCHC: 31.5 g/dL (ref 30.0–36.0)
MCV: 95.8 fL (ref 80.0–100.0)
Platelets: 225 K/uL (ref 150–400)
RBC: 2.62 MIL/uL — ABNORMAL LOW (ref 3.87–5.11)
RDW: 16.4 % — ABNORMAL HIGH (ref 11.5–15.5)
WBC: 8.5 K/uL (ref 4.0–10.5)
nRBC: 0 % (ref 0.0–0.2)

## 2023-10-26 LAB — OSMOLALITY: Osmolality: 280 mosm/kg (ref 275–295)

## 2023-10-26 LAB — TSH: TSH: 47 u[IU]/mL — ABNORMAL HIGH (ref 0.350–4.500)

## 2023-10-26 LAB — MAGNESIUM: Magnesium: 2 mg/dL (ref 1.7–2.4)

## 2023-10-26 LAB — T4, FREE: Free T4: 0.41 ng/dL — ABNORMAL LOW (ref 0.61–1.12)

## 2023-10-26 MED ORDER — LEVOTHYROXINE SODIUM 25 MCG PO TABS: 25.0000 ug | ORAL_TABLET | Freq: Every day | ORAL | Status: DC

## 2023-10-26 MED ORDER — LEVOTHYROXINE SODIUM 25 MCG PO TABS: 25.0000 ug | ORAL_TABLET | Freq: Every day | ORAL | 2 refills | Status: DC

## 2023-10-26 MED ORDER — ASPIRIN 81 MG PO TBEC: 81.0000 mg | DELAYED_RELEASE_TABLET | Freq: Every day | ORAL | 2 refills | Status: DC

## 2023-10-26 NOTE — TOC Transition Note (Signed)
 Transition of Care [facility]) - Discharge Note   Patient Details  Name: Maria Pace MRN: [id number] Date of Birth: March 09, 1941  Transition of [facility]) CM/SW Contact:  Hoy DELENA Bigness, [name] Phone Number: 10/26/2023, 2:27 PM   Clinical Narrative:    Pt to return to [facility]. FL-2 and DC summary emailed for review to Ms. Harrison at [email or web address]. [facility] to provide transportation for pt at discharge. No further TOC needs identified.    Final next level of care: Group Home (Family care home) Barriers to Discharge: Barriers Resolved   Patient Goals and CMS Choice Patient states their goals for this hospitalization and ongoing recovery are:: To return to [facility] CMS Medicare.gov Compare Post Acute Care list provided to:: Patient Choice offered to / list presented to : Patient [place] ownership interest in [facility].provided to::  (NA)    Discharge Placement                       Discharge Plan and Services Additional resources added to the After Visit Summary for   In-house Referral: Clinical Social Work Discharge Planning Services: NA Post Acute Care Choice: NA          DME Arranged: N/A DME Agency: NA                  Social Drivers of Health (SDOH) Interventions SDOH Screenings   Food Insecurity: No Food Insecurity (10/21/2023)  Housing: Unknown (10/21/2023)  Transportation Needs: No Transportation Needs (10/21/2023)  Utilities: Not At Risk (10/21/2023)  Depression (PHQ2-9): High Risk (09/06/2021)  Social Connections: Unknown (10/21/2023)  Tobacco Use: Low Risk  (10/25/2023)     Readmission Risk Interventions    07/14/2022   11:16 AM  Readmission Risk Prevention Plan  Transportation Screening Complete  PCP or Specialist Appt within 5-7 Days Not Complete  Home Care Screening Complete  Medication Review (RN CM) Complete

## 2023-10-26 NOTE — Discharge Summary (Signed)
 Physician Discharge Summary  Maria Pace FMW:[id number] DOB: 1940/07/29 DOA: 10/20/2023  PCP: Gammon, Chrystal, NP  Admit date: 10/20/2023  Discharge date: 10/26/2023  Admitted From:Group home  Disposition:  Group home  Recommendations for Outpatient Follow-up:  Follow up with PCP in 1-2 weeks, recheck BMP in 1 week Remain on levothyroxine  25 mcg daily as prescribed and titrate dose in the outpatient setting and follow-up TSH Discontinue further use of apixaban  and start aspirin  81 mg daily as patient refused endoscopy Continue other home medications as prior  Home Health: None  Equipment/Devices: None  Discharge Condition:Stable  CODE STATUS: Full  Diet recommendation: Heart Healthy/carb modified  Brief/Interim Summary: 83 y/o female   with medical history significant of bipolar 1 disorder, schizophrenia, essential hypertension, GERD, hyperlipidemia, history of irregular heartbeat, type 2 diabetes mellitus, PAF on apixaban , HFrEF (EF 30-35%), HTN presenting with abnormal labs. She was contacted to go to emergency department because of abnormal values, particularly low blood count. The patient has been complaining of generalized weakness and feeling weak in the knees for about a week.    GI was consulted and will consult after admission.  GI initially plan for EGD and colonoscopy.  The patient initially agreed and signed consent.  The patient subsequently had some difficulty tolerating the colonoscopy preparation.  Out of her frustration, the patient decided that she did not want any further interventions and refused further EGD or colonoscopy.  The risks, benefits, and alternatives were discussed with the patient.  It was discussed with the patient that it was not safe to restart apixaban  without further clarifying source of bleeding.  Therefore, the patient will be discharged home with aspirin  in lieu of apixaban .   -Patient has refused EGD 7/30.  Now noted to have some  hyponatremia that appears to be related to hypothyroidism and therefore patient was started on levothyroxine .  Hemoglobin levels continue to remain stable and patient has noted to have mild creatinine elevation, but otherwise appears euvolemic and is tolerating her diet.  She may follow-up BMP in the outpatient setting as well as sodium levels especially after initiation of levothyroxine .  She is not asymptomatic from either of this and may discharge back to her group home today.  Discharge Diagnoses:  Principal Problem:   Acute blood loss anemia Active Problems:   Schizophrenia (HCC)   PAF (paroxysmal atrial fibrillation) (HCC)   Chronic HFrEF (heart failure with reduced ejection fraction) (HCC)   Symptomatic anemia   Melena   Chronic anticoagulation  Principal discharge diagnosis: Symptomatic anemia/acute blood loss anemia with deferred endoscopy.  Hyponatremia with noted new diagnosis of hypothyroidism.  Discharge Instructions  Discharge Instructions     Diet - low sodium heart healthy   Complete by: As directed    Increase activity slowly   Complete by: As directed       Allergies as of 10/26/2023       Reactions   Haloperidol Lactate Other (See Comments)   Couldn't urinate anymore        Medication List     STOP taking these medications    apixaban  5 MG Tabs tablet Commonly known as: ELIQUIS    metoprolol  tartrate 25 MG tablet Commonly known as: LOPRESSOR        TAKE these medications    amiodarone  200 MG tablet Commonly known as: PACERONE  Take 1 tablet (200 mg total) by mouth 2 (two) times daily for 21 days, THEN 1 tablet (200 mg total) daily. Start taking on: November 02, 2022  What changed: See the new instructions.   ammonium lactate 12 % lotion Commonly known as: LAC-HYDRIN Apply 1 Application topically as needed for dry skin.   ascorbic acid  500 MG tablet Commonly known as: VITAMIN C  Take 500 mg by mouth daily.   aspirin  EC 81 MG tablet Take 1  tablet (81 mg total) by mouth daily. Swallow whole.   atorvastatin  20 MG tablet Commonly known as: LIPITOR Take 20 mg by mouth daily.   cholecalciferol  10 MCG (400 UNIT) Tabs tablet Commonly known as: VITAMIN D3 Take 1,000 Units by mouth daily.   [employer] MEDICATED CHEST RUB EX Apply topically.   cyanocobalamin  500 MCG tablet Commonly known as: VITAMIN B12 Take 1 tablet (500 mcg total) by mouth daily.   divalproex  250 MG 24 hr tablet Commonly known as: DEPAKOTE  ER Take 1 tablet (250 mg total) by mouth daily.   docusate sodium  100 MG capsule Commonly known as: COLACE Take 200 mg by mouth daily.   FeroSul 325 (65 Fe) MG tablet Generic drug: ferrous sulfate  Take 325 mg by mouth 2 (two) times daily with a meal.   gabapentin  400 MG capsule Commonly known as: NEURONTIN  Take 400 mg by mouth at bedtime.   latanoprost  0.005 % ophthalmic solution Commonly known as: XALATAN  Place 1 drop into both eyes at bedtime.   levothyroxine  25 MCG tablet Commonly known as: SYNTHROID  Take 1 tablet (25 mcg total) by mouth daily at 6 (six) AM. Start taking on: October 27, 2023   loratadine  10 MG tablet Commonly known as: CLARITIN  Take 10 mg by mouth daily.   metFORMIN 500 MG tablet Commonly known as: GLUCOPHAGE Take 500 mg by mouth 2 (two) times daily with a meal.   multivitamin tablet Take 1 tablet by mouth daily.   OLANZapine  5 MG tablet Commonly known as: ZYPREXA  Take 1 tablet (5 mg total) by mouth at bedtime. Total of 25 mg at night. Take along with 20 mg tab What changed: Another medication with the same name was changed. Make sure you understand how and when to take each.   OLANZapine  20 MG tablet Commonly known as: ZYPREXA  Take 1 tablet (20 mg total) by mouth at bedtime. Total of 25 mg at night. Take along with 20 mg tab What changed: additional instructions   pantoprazole  40 MG tablet Commonly known as: PROTONIX  Take 40 mg by mouth daily.   polyethylene glycol powder 17  GM/SCOOP powder Commonly known as: GLYCOLAX /MIRALAX  Take 17 g by mouth daily as needed for mild constipation or moderate constipation.   sacubitril -valsartan  24-26 MG Commonly known as: ENTRESTO  Take 1 tablet by mouth 2 (two) times daily.   senna-docusate 8.6-50 MG tablet Commonly known as: Senokot-S Take 2 tablets by mouth at bedtime.   spironolactone  25 MG tablet Commonly known as: ALDACTONE  Take 0.5 tablets (12.5 mg total) by mouth daily.   zinc  sulfate (50mg  elemental zinc ) 220 (50 Zn) MG capsule Take 220 mg by mouth daily.        Follow-up Information     Gammon, Chrystal, NP. Schedule an appointment as soon as possible for a visit.   Specialty: Nurse Practitioner Contact information: [address] [place] [address] [phone]                Allergies  Allergen Reactions   Haloperidol Lactate Other (See Comments)    Couldn't urinate anymore    Consultations: GI   Procedures/Studies: DG Chest Portable 1 View Result Date: 10/20/2023 CLINICAL DATA:  Weakness.  EXAM: PORTABLE CHEST 1 VIEW COMPARISON:  07/12/2022. FINDINGS: Low lung volumes with bronchovascular crowding. Cardiomegaly. No focal consolidation, pleural effusion, or pneumothorax. No acute osseous abnormality. IMPRESSION: 1. Low lung volumes.  No acute cardiopulmonary findings. 2. Cardiomegaly. Electronically Signed   By: Harrietta Sherry M.D.   On: 10/20/2023 17:12   ECHOCARDIOGRAM COMPLETE Result Date: 09/26/2023    ECHOCARDIOGRAM REPORT   Patient Name:   NAOMIE DELENA HURST Date of Exam: 09/26/2023 Medical Rec #:  [id number]      Height:       60.0 in Accession #:    [id number]     Weight:       194.0 lb Date of Birth:  1940/06/13       BSA:          1.843 m Patient Age:    82 years       BP:           104/64 mmHg Patient Gender: F              HR:           67 bpm. Exam Location:  [name] Procedure: 2D Echo, 3D Echo, Cardiac Doppler and Color Doppler (Both Spectral            and Color Flow  Doppler were utilized during procedure). Indications:    Stroke l63.9  History:        Patient has prior history of Echocardiogram examinations, most                 recent 06/28/2023. Arrythmias:Atrial Fibrillation; Risk                 Factors:Hypertension, Diabetes and Dyslipidemia. Hx of COVID-19.  Sonographer:    Aida Pizza RCS Referring Phys: [id number] VISHNU P MALLIPEDDI IMPRESSIONS  1. Left ventricular ejection fraction, by estimation, is 30 to 35%. The left ventricle has moderately decreased function. The left ventricle demonstrates regional wall motion abnormalities (see scoring diagram/findings for description). The left ventricular internal cavity size was mildly dilated. There is mild concentric left ventricular hypertrophy. Left ventricular diastolic parameters are indeterminate.  2. Right ventricular systolic function is normal. The right ventricular size is normal. Tricuspid regurgitation signal is inadequate for assessing PA pressure.  3. Left atrial size was moderately dilated.  4. The mitral valve is degenerative. Moderate mitral valve regurgitation.  5. The aortic valve is tricuspid. Aortic valve regurgitation is trivial. Aortic valve sclerosis is present, with no evidence of aortic valve stenosis. Aortic valve mean gradient measures 5.0 mmHg.  6. The inferior vena cava is normal in size with greater than 50% respiratory variability, suggesting right atrial pressure of 3 mmHg. Comparison(s): Prior images reviewed side by side. LVEF 30-35%, RV contraction normal. Moderate mitral regurgitation. FINDINGS  Left Ventricle: Left ventricular ejection fraction, by estimation, is 30 to 35%. The left ventricle has moderately decreased function. The left ventricle demonstrates regional wall motion abnormalities. The left ventricular internal cavity size was mildly dilated. There is mild concentric left ventricular hypertrophy. Left ventricular diastolic function could not be evaluated due to atrial  fibrillation. Left ventricular diastolic parameters are indeterminate.  LV Wall Scoring: The anterior wall, mid and distal lateral wall, entire septum, inferior wall, and apex are hypokinetic. The antero-lateral wall and basal inferolateral segment are normal. Right Ventricle: The right ventricular size is normal. No increase in right ventricular wall thickness. Right ventricular systolic function is normal. Tricuspid regurgitation signal is inadequate for assessing PA  pressure. Left Atrium: Left atrial size was moderately dilated. Right Atrium: Right atrial size was normal in size. Pericardium: There is no evidence of pericardial effusion. Mitral Valve: The mitral valve is degenerative in appearance. There is mild thickening of the mitral valve leaflet(s). Moderate mitral valve regurgitation. Tricuspid Valve: The tricuspid valve is grossly normal. Tricuspid valve regurgitation is trivial. Aortic Valve: The aortic valve is tricuspid. There is moderate aortic valve annular calcification. Aortic valve regurgitation is trivial. Aortic valve sclerosis is present, with no evidence of aortic valve stenosis. Aortic valve mean gradient measures 5.0 mmHg. Aortic valve peak gradient measures 8.8 mmHg. Aortic valve area, by VTI measures 1.18 cm. Pulmonic Valve: The pulmonic valve was grossly normal. Pulmonic valve regurgitation is trivial. Aorta: The aortic root is normal in size and structure. Venous: The inferior vena cava is normal in size with greater than 50% respiratory variability, suggesting right atrial pressure of 3 mmHg. IAS/Shunts: No atrial level shunt detected by color flow Doppler. Additional Comments: 3D was performed not requiring image post processing on an independent workstation and was indeterminate.  LEFT VENTRICLE PLAX 2D LVIDd:         5.90 cm   Diastology LVIDs:         4.60 cm   LV e' medial:    3.26 cm/s LV PW:         1.20 cm   LV E/e' medial:  27.1 LV IVS:        1.10 cm   LV e' lateral:   9.79  cm/s LVOT diam:     1.60 cm   LV E/e' lateral: 9.0 LV SV:         39 LV SV Index:   21 LVOT Area:     2.01 cm                           3D Volume EF:                          LV EDV:       203 ml                          LV ESV:       120 ml                          LV SV:        83 ml RIGHT VENTRICLE RV S prime:     11.40 cm/s TAPSE (M-mode): 2.5 cm LEFT ATRIUM             Index        RIGHT ATRIUM          Index LA diam:        5.10 cm 2.77 cm/m   RA Area:     9.92 cm LA Vol (A2C):   73.4 ml 39.83 ml/m  RA Volume:   20.00 ml 10.85 ml/m LA Vol (A4C):   76.2 ml 41.35 ml/m LA Biplane Vol: 75.1 ml 40.76 ml/m  AORTIC VALVE AV Area (Vmax):    1.55 cm AV Area (Vmean):   1.34 cm AV Area (VTI):     1.18 cm AV Vmax:           148.00 cm/s AV Vmean:          106.000 cm/s  AV VTI:            0.330 m AV Peak Grad:      8.8 mmHg AV Mean Grad:      5.0 mmHg LVOT Vmax:         114.00 cm/s LVOT Vmean:        70.700 cm/s LVOT VTI:          0.193 m LVOT/AV VTI ratio: 0.58  AORTA Ao Root diam: 3.00 cm MITRAL VALVE MV Area (PHT): 4.31 cm       SHUNTS MV Decel Time: 176 msec       Systemic VTI:  0.19 m MR Peak grad:    78.5 mmHg    Systemic Diam: 1.60 cm MR Mean grad:    45.0 mmHg MR Vmax:         443.00 cm/s MR Vmean:        313.0 cm/s MR PISA:         1.57 cm MR PISA Eff ROA: 13 mm MR PISA Radius:  0.50 cm MV E velocity: 88.20 cm/s MV A velocity: 80.10 cm/s MV E/A ratio:  1.10 Jayson Sierras MD Electronically signed by Jayson Sierras MD Signature Date/Time: 09/26/2023/4:22:32 PM    Final      Discharge Exam: Vitals:   10/26/23 0348 10/26/23 1343  BP: 120/75 106/68  Pulse: 76 89  Resp: 14 16  Temp: 97.6 F (36.4 C) (!) 97.4 F (36.3 C)  SpO2: 92% 100%   Vitals:   10/25/23 1347 10/25/23 1931 10/26/23 0348 10/26/23 1343  BP: 110/62 111/78 120/75 106/68  Pulse: 84 79 76 89  Resp: 16 16 14 16   Temp: 97.8 F (36.6 C) (!) 97.5 F (36.4 C) 97.6 F (36.4 C) (!) 97.4 F (36.3 C)  TempSrc: Oral Oral Oral Oral   SpO2: 97% 100% 92% 100%  Weight:      Height:        General: Pt is alert, awake, not in acute distress Cardiovascular: RRR, S1/S2 +, no rubs, no gallops Respiratory: CTA bilaterally, no wheezing, no rhonchi Abdominal: Soft, NT, ND, bowel sounds + Extremities: no edema, no cyanosis    The results of significant diagnostics from this hospitalization (including imaging, microbiology, ancillary and laboratory) are listed below for reference.     Microbiology: Recent Results (from the past 240 hours)  MRSA Next Gen by PCR, Nasal     Status: None   Collection Time: 10/20/23  7:35 PM   Specimen: Nasal Mucosa; Nasal Swab  Result Value Ref Range Status   MRSA by PCR Next Gen NOT DETECTED NOT DETECTED Final    Comment: (NOTE) The GeneXpert MRSA Assay (FDA approved for NASAL specimens only), is one component of a comprehensive MRSA colonization surveillance program. It is not intended to diagnose MRSA infection nor to guide or monitor treatment for MRSA infections. Test performance is not FDA approved in patients less than 2 years old. Performed at [facility], [address], [place], [place] [address]      Labs: BNP (last 3 results) Recent Labs    10/20/23 1605  BNP 228.0*   Basic Metabolic Panel: Recent Labs  Lab 10/20/23 1507 10/21/23 0444 10/22/23 0422 10/25/23 0805 10/26/23 0436  NA 137 141 136 130* 131*  K 4.4 4.1 4.4 4.9 4.4  CL 105 106 101 97* 100  CO2 20* 24 24 23 23   GLUCOSE 129* 72 61* 89 82  BUN 29* 28* 21 18 20   CREATININE 1.69*  1.54* 1.45* 1.74* 1.74*  CALCIUM  8.4* 8.7* 8.3* 8.5* 8.2*  MG  --   --   --  1.9 2.0   Liver Function Tests: No results for input(s): AST, ALT, ALKPHOS, BILITOT, PROT, ALBUMIN in the last 168 hours. No results for input(s): LIPASE, AMYLASE in the last 168 hours. No results for input(s): AMMONIA in the last 168 hours. CBC: Recent Labs  Lab 10/20/23 1507 10/20/23 2108 10/22/23 0422 10/23/23 0501  10/24/23 0449 10/25/23 0805 10/26/23 0436  WBC 8.7   < > 7.7 9.8 6.4 7.9 8.5  NEUTROABS 6.0  --   --   --   --   --   --   HGB 6.6*   < > 7.9* 8.9* 7.9* 8.7* 7.9*  HCT 21.1*   < > 24.6* 27.5* 24.9* 27.0* 25.1*  MCV 98.1   < > 94.3 92.9 95.0 94.4 95.8  PLT 240   < > 235 275 227 252 225   < > = values in this interval not displayed.   Cardiac Enzymes: No results for input(s): CKTOTAL, CKMB, CKMBINDEX, TROPONINI in the last 168 hours. BNP: Invalid input(s): POCBNP CBG: Recent Labs  Lab 10/22/23 1903 10/22/23 2027 10/23/23 0720 10/23/23 1113 10/23/23 1554  GLUCAP 160* 118* 79 118* 111*   D-Dimer No results for input(s): DDIMER in the last 72 hours. Hgb A1c No results for input(s): HGBA1C in the last 72 hours. Lipid Profile No results for input(s): CHOL, HDL, LDLCALC, TRIG, CHOLHDL, LDLDIRECT in the last 72 hours. Thyroid  function studies Recent Labs    10/26/23 0436  TSH 47.000*   Anemia work up No results for input(s): VITAMINB12, FOLATE, FERRITIN, TIBC, IRON, RETICCTPCT in the last 72 hours. Urinalysis    Component Value Date/Time   COLORURINE YELLOW 07/12/2022 0924   APPEARANCEUR CLEAR 07/12/2022 0924   APPEARANCEUR Clear 01/06/2022 1100   LABSPEC 1.011 07/12/2022 0924   LABSPEC 1.005 10/28/2013 0457   PHURINE 7.0 07/12/2022 0924   GLUCOSEU NEGATIVE 07/12/2022 0924   GLUCOSEU 50 mg/dL 91/96/7984 9542   HGBUR NEGATIVE 07/12/2022 0924   BILIRUBINUR NEGATIVE 07/12/2022 0924   BILIRUBINUR Negative 01/06/2022 1100   BILIRUBINUR Negative 10/28/2013 0457   KETONESUR NEGATIVE 07/12/2022 0924   PROTEINUR NEGATIVE 07/12/2022 0924   NITRITE NEGATIVE 07/12/2022 0924   LEUKOCYTESUR SMALL (A) 07/12/2022 0924   LEUKOCYTESUR 3+ 10/28/2013 0457   Sepsis Labs Recent Labs  Lab 10/23/23 0501 10/24/23 0449 10/25/23 0805 10/26/23 0436  WBC 9.8 6.4 7.9 8.5   Microbiology Recent Results (from the past 240 hours)  MRSA Next Gen by  PCR, Nasal     Status: None   Collection Time: 10/20/23  7:35 PM   Specimen: Nasal Mucosa; Nasal Swab  Result Value Ref Range Status   MRSA by PCR Next Gen NOT DETECTED NOT DETECTED Final    Comment: (NOTE) The GeneXpert MRSA Assay (FDA approved for NASAL specimens only), is one component of a comprehensive MRSA colonization surveillance program. It is not intended to diagnose MRSA infection nor to guide or monitor treatment for MRSA infections. Test performance is not FDA approved in patients less than 21 years old. Performed at [facility], [address] [place], [place] [address]      Time coordinating discharge: 35 minutes  SIGNED:   Adron JONETTA Fairly, DO [facility] 10/26/2023, 2:10 PM  If 7PM-7AM, please contact night-coverage [email or web address]

## 2023-10-26 NOTE — Plan of Care (Signed)
   Problem: Education: Goal: Knowledge of General Education information will improve Description Including pain rating scale, medication(s)/side effects and non-pharmacologic comfort measures Outcome: Progressing

## 2023-10-26 NOTE — Progress Notes (Signed)
 Mobility Specialist Progress Note:    10/26/23 1040  Mobility  Activity Ambulated with assistance  Level of Assistance Contact guard assist, steadying assist  Assistive Device Front wheel walker  Distance Ambulated (ft) 60 ft  Range of Motion/Exercises Active;All extremities  Activity Response Tolerated well  Mobility Referral Yes  Mobility visit 1 Mobility  Mobility Specialist Start Time (ACUTE ONLY) 1023  Mobility Specialist Stop Time (ACUTE ONLY) 1040  Mobility Specialist Time Calculation (min) (ACUTE ONLY) 17 min   Pt received in chair, agreeable to mobility. Required CGA to stand and ambulate with RW. Tolerated well,.asx throughout. Returned to chair, all needs met.   Sherrilee Ditty Mobility Specialist Please contact via Special educational needs teacher or  Rehab office at [phone]

## 2023-10-26 NOTE — [place] FL2 (Signed)
 [place]  MEDICAID FL2 LEVEL OF CARE FORM     IDENTIFICATION  Patient Name: Maria Pace Birthdate: 07/13/1940 Sex: female Admission Date (Current Location): 10/20/2023  [place] and [place] Number:  Maria Pace [id number] Facility and Address:  [facility],  [address] [place] [address]      Provider Number: [id number]  Attending Physician Name and Address:  Maree Adron BIRCH, DO  Relative Name and Phone Number:  Holt,Vernell Sister [phone]    Current Level of Care: Hospital Recommended Level of Care: [facility] Prior Approval Number:    Date Approved/Denied:   PASRR Number:    Discharge Plan: Other (Comment) [facility])    Current Diagnoses: Patient Active Problem List   Diagnosis Date Noted   Symptomatic anemia 10/21/2023   Melena 10/21/2023   Chronic anticoagulation 10/21/2023   Acute blood loss anemia 10/20/2023   Chronic HFrEF (heart failure with reduced ejection fraction) (HCC) 10/20/2023   Current use of long term anticoagulation 09/01/2023   PAF (paroxysmal atrial fibrillation) (HCC) 09/01/2023   Sepsis due to COVID-19 (HCC) 07/12/2022   Chronic combined systolic and diastolic congestive heart failure (HCC)    ARF (acute renal failure) (HCC) 10/04/2019   Elevated CK 10/03/2019   SVT (supraventricular tachycardia) (HCC) 10/03/2019   GERD (gastroesophageal reflux disease)    Schizophrenia (HCC) 11/15/2017   Hypotension 02/20/2017   Severe dehydration 02/20/2017   AKI (acute kidney injury) (HCC) 02/20/2017   Constipation 02/20/2017   Lactic acidosis 02/20/2017   Urinary retention 11/10/2016   Hypertension 11/10/2016   Type 2 diabetes mellitus (HCC) 11/10/2016   Hyperlipidemia 11/10/2016   Hyponatremia 11/10/2016   SBO (small bowel obstruction) (HCC) 11/03/2015    Orientation RESPIRATION BLADDER Height & Weight     Self, Time, Situation, Place  Normal Continent Weight: 194 lb 0.1 oz (88 kg) Height:  5' (152.4 cm)  BEHAVIORAL  SYMPTOMS/MOOD NEUROLOGICAL BOWEL NUTRITION STATUS      Continent Diet (heart healthy/carb modified)  AMBULATORY STATUS COMMUNICATION OF NEEDS Skin   Independent Verbally Normal                       Personal Care Assistance Level of Assistance  Bathing, Feeding, Dressing Bathing Assistance: Independent Feeding assistance: Independent Dressing Assistance: Independent     Functional Limitations Info  Sight, Hearing, Speech Sight Info: Impaired Hearing Info: Adequate Speech Info: Adequate    SPECIAL CARE FACTORS FREQUENCY                       Contractures Contractures Info: Not present    Additional Factors Info  Code Status, Allergies, Psychotropic Code Status Info: FULL Allergies Info: Haloperidol Lactate Psychotropic Info: See discharge summary and MAR         Current Medications (10/26/2023):  This is the current hospital active medication list Current Facility-Administered Medications  Medication Dose Route Frequency Provider Last Rate Last Admin   acetaminophen  (TYLENOL ) tablet 650 mg  650 mg Oral Q6H PRN Tat, David, MD       Or   acetaminophen  (TYLENOL ) suppository 650 mg  650 mg Rectal Q6H PRN Tat, Alm, MD       amiodarone  (PACERONE ) tablet 200 mg  200 mg Oral Daily Tat, David, MD   200 mg at 10/26/23 9072   ascorbic acid  (VITAMIN C ) tablet 500 mg  500 mg Oral Daily Tat, Alm, MD   500 mg at 10/26/23 0925   atorvastatin  (LIPITOR) tablet 20  mg  20 mg Oral Daily Tat, David, MD   20 mg at 10/26/23 9074   cholecalciferol  (VITAMIN D3) 25 MCG (1000 UNIT) tablet 1,000 Units  1,000 Units Oral Daily Tat, David, MD   1,000 Units at 10/26/23 9073   divalproex  (DEPAKOTE  ER) 24 hr tablet 250 mg  250 mg Oral Daily Tat, Alm, MD   250 mg at 10/26/23 9072   ferrous sulfate  tablet 325 mg  325 mg Oral BID WC Evonnie Alm, MD   325 mg at 10/26/23 9074   gabapentin  (NEURONTIN ) capsule 400 mg  400 mg Oral QHS Tat, David, MD   400 mg at 10/25/23 2238   latanoprost   (XALATAN ) 0.005 % ophthalmic solution 1 drop  1 drop Both Eyes QHS Evonnie Alm, MD   1 drop at 10/25/23 2239   [START ON 10/27/2023] levothyroxine  (SYNTHROID ) tablet 25 mcg  25 mcg Oral Q0600 Maree, Pratik D, DO       loratadine  (CLARITIN ) tablet 10 mg  10 mg Oral Daily Tat, Alm, MD   10 mg at 10/26/23 0926   multivitamin with minerals tablet 1 tablet  1 tablet Oral Daily Tat, Alm, MD   1 tablet at 10/26/23 9075   OLANZapine  (ZYPREXA ) tablet 20 mg  20 mg Oral QHS Tat, David, MD   20 mg at 10/25/23 2238   OLANZapine  (ZYPREXA ) tablet 5 mg  5 mg Oral QHS Evonnie Alm, MD   5 mg at 10/25/23 2238   ondansetron  (ZOFRAN ) tablet 4 mg  4 mg Oral Q6H PRN Tat, Alm, MD       Or   ondansetron  (ZOFRAN ) injection 4 mg  4 mg Intravenous Q6H PRN Tat, Alm, MD       Oral care mouth rinse  15 mL Mouth Rinse PRN Tat, Alm, MD       pantoprazole  (PROTONIX ) injection 40 mg  40 mg Intravenous Q12H Tat, David, MD   40 mg at 10/25/23 2239   polyethylene glycol powder (GLYCOLAX /MIRALAX ) container 17 g  17 g Oral Daily PRN Tat, Alm, MD       senna-docusate (Senokot-S) tablet 2 tablet  2 tablet Oral QHS Tat, David, MD   2 tablet at 10/25/23 2239   sodium chloride  0.9 % bolus 1,000 mL  1,000 mL Intravenous Once Dorrell, Robert, MD   Held at 10/21/23 0536   vitamin B-12 (CYANOCOBALAMIN ) tablet 500 mcg  500 mcg Oral Daily Tat, Alm, MD   500 mcg at 10/26/23 9075     Discharge Medications: TAKE these medications     amiodarone  200 MG tablet Commonly known as: PACERONE  Take 1 tablet (200 mg total) by mouth 2 (two) times daily for 21 days, THEN 1 tablet (200 mg total) daily. Start taking on: November 02, 2022 What changed: See the new instructions.    ammonium lactate 12 % lotion Commonly known as: LAC-HYDRIN Apply 1 Application topically as needed for dry skin.    ascorbic acid  500 MG tablet Commonly known as: VITAMIN C  Take 500 mg by mouth daily.    aspirin  EC 81 MG tablet Take 1 tablet (81 mg total) by mouth  daily. Swallow whole.    atorvastatin  20 MG tablet Commonly known as: LIPITOR Take 20 mg by mouth daily.    cholecalciferol  10 MCG (400 UNIT) Tabs tablet Commonly known as: VITAMIN D3 Take 1,000 Units by mouth daily.    [employer] MEDICATED CHEST RUB EX Apply topically.    cyanocobalamin  500 MCG tablet Commonly known as: VITAMIN  B12 Take 1 tablet (500 mcg total) by mouth daily.    divalproex  250 MG 24 hr tablet Commonly known as: DEPAKOTE  ER Take 1 tablet (250 mg total) by mouth daily.    docusate sodium  100 MG capsule Commonly known as: COLACE Take 200 mg by mouth daily.    FeroSul 325 (65 Fe) MG tablet Generic drug: ferrous sulfate  Take 325 mg by mouth 2 (two) times daily with a meal.    gabapentin  400 MG capsule Commonly known as: NEURONTIN  Take 400 mg by mouth at bedtime.    latanoprost  0.005 % ophthalmic solution Commonly known as: XALATAN  Place 1 drop into both eyes at bedtime.    levothyroxine  25 MCG tablet Commonly known as: SYNTHROID  Take 1 tablet (25 mcg total) by mouth daily at 6 (six) AM. Start taking on: October 27, 2023    loratadine  10 MG tablet Commonly known as: CLARITIN  Take 10 mg by mouth daily.    metFORMIN 500 MG tablet Commonly known as: GLUCOPHAGE Take 500 mg by mouth 2 (two) times daily with a meal.    multivitamin tablet Take 1 tablet by mouth daily.    OLANZapine  5 MG tablet Commonly known as: ZYPREXA  Take 1 tablet (5 mg total) by mouth at bedtime. Total of 25 mg at night. Take along with 20 mg tab What changed: Another medication with the same name was changed. Make sure you understand how and when to take each.    OLANZapine  20 MG tablet Commonly known as: ZYPREXA  Take 1 tablet (20 mg total) by mouth at bedtime. Total of 25 mg at night. Take along with 20 mg tab What changed: additional instructions    pantoprazole  40 MG tablet Commonly known as: PROTONIX  Take 40 mg by mouth daily.    polyethylene glycol powder 17 GM/SCOOP  powder Commonly known as: GLYCOLAX /MIRALAX  Take 17 g by mouth daily as needed for mild constipation or moderate constipation.    sacubitril -valsartan  24-26 MG Commonly known as: ENTRESTO  Take 1 tablet by mouth 2 (two) times daily.    senna-docusate 8.6-50 MG tablet Commonly known as: Senokot-S Take 2 tablets by mouth at bedtime.    spironolactone  25 MG tablet Commonly known as: ALDACTONE  Take 0.5 tablets (12.5 mg total) by mouth daily.    zinc  sulfate (50mg  elemental zinc ) 220 (50 Zn) MG capsule Take 220 mg by mouth daily.    Relevant Imaging Results:  Relevant Lab Results:   Additional Information SSN: [id number]  Hoy DELENA Bigness, [name]

## 2024-02-16 ENCOUNTER — Emergency Department (HOSPITAL_COMMUNITY)

## 2024-02-16 ENCOUNTER — Inpatient Hospital Stay (HOSPITAL_COMMUNITY)
Admission: EM | Admit: 2024-02-16 | Discharge: 2024-03-01 | DRG: 872 | Disposition: A | Discharge status: Home / Self Care | Attending: Family Medicine | Admitting: Family Medicine

## 2024-02-16 ENCOUNTER — Other Ambulatory Visit: Payer: Self-pay

## 2024-02-16 ENCOUNTER — Encounter (HOSPITAL_COMMUNITY): Payer: Self-pay | Admitting: Internal Medicine

## 2024-02-16 DIAGNOSIS — K5289 Other specified noninfective gastroenteritis and colitis: Secondary | ICD-10-CM | POA: Diagnosis present

## 2024-02-16 DIAGNOSIS — D62 Acute posthemorrhagic anemia: Secondary | ICD-10-CM | POA: Diagnosis not present

## 2024-02-16 DIAGNOSIS — I5022 Chronic systolic (congestive) heart failure: Secondary | ICD-10-CM | POA: Diagnosis present

## 2024-02-16 DIAGNOSIS — N179 Acute kidney failure, unspecified: Secondary | ICD-10-CM | POA: Diagnosis not present

## 2024-02-16 DIAGNOSIS — Z7901 Long term (current) use of anticoagulants: Secondary | ICD-10-CM | POA: Diagnosis not present

## 2024-02-16 DIAGNOSIS — E1121 Type 2 diabetes mellitus with diabetic nephropathy: Secondary | ICD-10-CM | POA: Diagnosis not present

## 2024-02-16 DIAGNOSIS — E1122 Type 2 diabetes mellitus with diabetic chronic kidney disease: Secondary | ICD-10-CM | POA: Diagnosis present

## 2024-02-16 DIAGNOSIS — E66813 Obesity, class 3: Secondary | ICD-10-CM | POA: Diagnosis present

## 2024-02-16 DIAGNOSIS — I130 Hypertensive heart and chronic kidney disease with heart failure and stage 1 through stage 4 chronic kidney disease, or unspecified chronic kidney disease: Secondary | ICD-10-CM | POA: Diagnosis present

## 2024-02-16 DIAGNOSIS — E860 Dehydration: Secondary | ICD-10-CM | POA: Diagnosis present

## 2024-02-16 DIAGNOSIS — Z6841 Body Mass Index (BMI) 40.0 and over, adult: Secondary | ICD-10-CM | POA: Diagnosis not present

## 2024-02-16 DIAGNOSIS — K5641 Fecal impaction: Secondary | ICD-10-CM | POA: Diagnosis present

## 2024-02-16 DIAGNOSIS — E872 Acidosis, unspecified: Secondary | ICD-10-CM | POA: Diagnosis not present

## 2024-02-16 DIAGNOSIS — E871 Hypo-osmolality and hyponatremia: Secondary | ICD-10-CM | POA: Diagnosis not present

## 2024-02-16 DIAGNOSIS — I10 Essential (primary) hypertension: Secondary | ICD-10-CM | POA: Diagnosis present

## 2024-02-16 DIAGNOSIS — F319 Bipolar disorder, unspecified: Secondary | ICD-10-CM | POA: Diagnosis present

## 2024-02-16 DIAGNOSIS — I447 Left bundle-branch block, unspecified: Secondary | ICD-10-CM | POA: Diagnosis present

## 2024-02-16 DIAGNOSIS — E119 Type 2 diabetes mellitus without complications: Secondary | ICD-10-CM

## 2024-02-16 DIAGNOSIS — K529 Noninfective gastroenteritis and colitis, unspecified: Secondary | ICD-10-CM | POA: Diagnosis not present

## 2024-02-16 DIAGNOSIS — R008 Other abnormalities of heart beat: Secondary | ICD-10-CM | POA: Diagnosis not present

## 2024-02-16 DIAGNOSIS — K922 Gastrointestinal hemorrhage, unspecified: Secondary | ICD-10-CM | POA: Diagnosis present

## 2024-02-16 DIAGNOSIS — K5909 Other constipation: Secondary | ICD-10-CM

## 2024-02-16 DIAGNOSIS — K219 Gastro-esophageal reflux disease without esophagitis: Secondary | ICD-10-CM | POA: Diagnosis present

## 2024-02-16 DIAGNOSIS — N1832 Chronic kidney disease, stage 3b: Secondary | ICD-10-CM | POA: Diagnosis present

## 2024-02-16 DIAGNOSIS — R5381 Other malaise: Secondary | ICD-10-CM | POA: Diagnosis not present

## 2024-02-16 DIAGNOSIS — F203 Undifferentiated schizophrenia: Secondary | ICD-10-CM | POA: Diagnosis not present

## 2024-02-16 DIAGNOSIS — D649 Anemia, unspecified: Secondary | ICD-10-CM

## 2024-02-16 DIAGNOSIS — A419 Sepsis, unspecified organism: Secondary | ICD-10-CM | POA: Diagnosis present

## 2024-02-16 DIAGNOSIS — I5042 Chronic combined systolic (congestive) and diastolic (congestive) heart failure: Secondary | ICD-10-CM

## 2024-02-16 DIAGNOSIS — E039 Hypothyroidism, unspecified: Secondary | ICD-10-CM | POA: Diagnosis present

## 2024-02-16 DIAGNOSIS — F209 Schizophrenia, unspecified: Secondary | ICD-10-CM | POA: Diagnosis present

## 2024-02-16 DIAGNOSIS — Z515 Encounter for palliative care: Secondary | ICD-10-CM | POA: Diagnosis not present

## 2024-02-16 DIAGNOSIS — E782 Mixed hyperlipidemia: Secondary | ICD-10-CM | POA: Diagnosis present

## 2024-02-16 DIAGNOSIS — Z7189 Other specified counseling: Secondary | ICD-10-CM | POA: Diagnosis not present

## 2024-02-16 DIAGNOSIS — I471 Supraventricular tachycardia, unspecified: Secondary | ICD-10-CM | POA: Diagnosis present

## 2024-02-16 DIAGNOSIS — I272 Pulmonary hypertension, unspecified: Secondary | ICD-10-CM | POA: Diagnosis present

## 2024-02-16 DIAGNOSIS — I480 Paroxysmal atrial fibrillation: Secondary | ICD-10-CM | POA: Diagnosis present

## 2024-02-16 LAB — CBC WITH DIFFERENTIAL/PLATELET
Abs Immature Granulocytes: 0.05 K/uL (ref 0.00–0.07)
Basophils Absolute: 0 K/uL (ref 0.0–0.1)
Basophils Relative: 0 %
Eosinophils Absolute: 0.1 K/uL (ref 0.0–0.5)
Eosinophils Relative: 0 %
HCT: 38 % (ref 36.0–46.0)
Hemoglobin: 12.1 g/dL (ref 12.0–15.0)
Immature Granulocytes: 0 %
Lymphocytes Relative: 9 %
Lymphs Abs: 1 K/uL (ref 0.7–4.0)
MCH: 29.4 pg (ref 26.0–34.0)
MCHC: 31.8 g/dL (ref 30.0–36.0)
MCV: 92.5 fL (ref 80.0–100.0)
Monocytes Absolute: 1.6 K/uL — ABNORMAL HIGH (ref 0.1–1.0)
Monocytes Relative: 13 %
Neutro Abs: 9.5 K/uL — ABNORMAL HIGH (ref 1.7–7.7)
Neutrophils Relative %: 78 %
Platelets: 219 K/uL (ref 150–400)
RBC: 4.11 MIL/uL (ref 3.87–5.11)
RDW: 16.2 % — ABNORMAL HIGH (ref 11.5–15.5)
WBC: 12.3 K/uL — ABNORMAL HIGH (ref 4.0–10.5)
nRBC: 0 % (ref 0.0–0.2)

## 2024-02-16 LAB — URINALYSIS, ROUTINE W REFLEX MICROSCOPIC
Bilirubin Urine: NEGATIVE
Glucose, UA: NEGATIVE mg/dL
Hgb urine dipstick: NEGATIVE
Ketones, ur: NEGATIVE mg/dL
Leukocytes,Ua: NEGATIVE
Nitrite: NEGATIVE
Protein, ur: NEGATIVE mg/dL
Specific Gravity, Urine: 1.015 (ref 1.005–1.030)
pH: 5 (ref 5.0–8.0)

## 2024-02-16 LAB — TYPE AND SCREEN
ABO/RH(D): O POS
Antibody Screen: NEGATIVE

## 2024-02-16 LAB — CBC
HCT: 33.7 % — ABNORMAL LOW (ref 36.0–46.0)
Hemoglobin: 10.8 g/dL — ABNORMAL LOW (ref 12.0–15.0)
MCH: 29.8 pg (ref 26.0–34.0)
MCHC: 32 g/dL (ref 30.0–36.0)
MCV: 92.8 fL (ref 80.0–100.0)
Platelets: 202 K/uL (ref 150–400)
RBC: 3.63 MIL/uL — ABNORMAL LOW (ref 3.87–5.11)
RDW: 16.3 % — ABNORMAL HIGH (ref 11.5–15.5)
WBC: 11.5 K/uL — ABNORMAL HIGH (ref 4.0–10.5)
nRBC: 0 % (ref 0.0–0.2)

## 2024-02-16 LAB — CBG MONITORING, ED: Glucose-Capillary: 122 mg/dL — ABNORMAL HIGH (ref 70–99)

## 2024-02-16 LAB — COMPREHENSIVE METABOLIC PANEL WITH GFR
ALT: 33 U/L (ref 0–44)
AST: 49 U/L — ABNORMAL HIGH (ref 15–41)
Albumin: 3.6 g/dL (ref 3.5–5.0)
Alkaline Phosphatase: 88 U/L (ref 38–126)
Anion gap: 15 (ref 5–15)
BUN: 34 mg/dL — ABNORMAL HIGH (ref 8–23)
CO2: 23 mmol/L (ref 22–32)
Calcium: 8.8 mg/dL — ABNORMAL LOW (ref 8.9–10.3)
Chloride: 101 mmol/L (ref 98–111)
Creatinine, Ser: 1.66 mg/dL — ABNORMAL HIGH (ref 0.44–1.00)
GFR, Estimated: 31 mL/min — ABNORMAL LOW (ref >60)
Glucose, Bld: 119 mg/dL — ABNORMAL HIGH (ref 70–99)
Potassium: 4.3 mmol/L (ref 3.5–5.1)
Sodium: 140 mmol/L (ref 135–145)
Total Bilirubin: 0.4 mg/dL (ref 0.0–1.2)
Total Protein: 7 g/dL (ref 6.5–8.1)

## 2024-02-16 LAB — VALPROIC ACID LEVEL: Valproic Acid Lvl: <10 ug/mL — ABNORMAL LOW (ref 50–100)

## 2024-02-16 LAB — POC OCCULT BLOOD, ED: Fecal Occult Bld: POSITIVE — AB

## 2024-02-16 LAB — PROTIME-INR
INR: 1 (ref 0.8–1.2)
Prothrombin Time: 14.1 s (ref 11.4–15.2)

## 2024-02-16 LAB — LACTIC ACID, PLASMA: Lactic Acid, Venous: 3.1 mmol/L (ref 0.5–1.9)

## 2024-02-16 MED ORDER — PANTOPRAZOLE SODIUM 40 MG IV SOLR
40.0000 mg | Freq: Once | INTRAVENOUS | Status: AC
  Administered 2024-02-16: 40 mg via INTRAVENOUS
  Filled 2024-02-16: qty 10

## 2024-02-16 MED ORDER — SENNOSIDES-DOCUSATE SODIUM 8.6-50 MG PO TABS
1.0000 | ORAL_TABLET | Freq: Two times a day (BID) | ORAL | Status: DC
  Administered 2024-02-16 – 2024-03-01 (×24): 1 via ORAL
  Filled 2024-02-16 (×26): qty 1

## 2024-02-16 MED ORDER — OLANZAPINE 5 MG PO TABS
20.0000 mg | ORAL_TABLET | Freq: Every day | ORAL | Status: DC
  Administered 2024-02-16 – 2024-02-29 (×14): 20 mg via ORAL
  Filled 2024-02-16 (×15): qty 4

## 2024-02-16 MED ORDER — OLANZAPINE 5 MG PO TABS
5.0000 mg | ORAL_TABLET | Freq: Every day | ORAL | Status: DC
  Administered 2024-02-16 – 2024-02-29 (×14): 5 mg via ORAL
  Filled 2024-02-16 (×14): qty 1

## 2024-02-16 MED ORDER — PANTOPRAZOLE SODIUM 40 MG PO TBEC
40.0000 mg | DELAYED_RELEASE_TABLET | Freq: Two times a day (BID) | ORAL | Status: DC
  Administered 2024-02-16 – 2024-03-01 (×28): 40 mg via ORAL
  Filled 2024-02-16 (×28): qty 1

## 2024-02-16 MED ORDER — DIVALPROEX SODIUM ER 250 MG PO TB24
250.0000 mg | ORAL_TABLET | Freq: Every day | ORAL | Status: DC
  Administered 2024-02-16 – 2024-03-01 (×15): 250 mg via ORAL
  Filled 2024-02-16 (×17): qty 1

## 2024-02-16 MED ORDER — PANTOPRAZOLE SODIUM 40 MG PO TBEC: 40.0000 mg | DELAYED_RELEASE_TABLET | Freq: Every day | ORAL | Status: DC

## 2024-02-16 MED ORDER — ONDANSETRON HCL 4 MG/2ML IJ SOLN: 4.0000 mg | Freq: Four times a day (QID) | INTRAMUSCULAR | Status: DC | PRN

## 2024-02-16 MED ORDER — LEVOTHYROXINE SODIUM 25 MCG PO TABS
50.0000 ug | ORAL_TABLET | Freq: Every day | ORAL | Status: DC
  Administered 2024-02-17 – 2024-02-22 (×6): 50 ug via ORAL
  Filled 2024-02-16: qty 2
  Filled 2024-02-16: qty 1
  Filled 2024-02-16 (×2): qty 2
  Filled 2024-02-16: qty 1
  Filled 2024-02-16: qty 2

## 2024-02-16 MED ORDER — ACETAMINOPHEN 325 MG PO TABS
650.0000 mg | ORAL_TABLET | Freq: Four times a day (QID) | ORAL | Status: DC | PRN
  Administered 2024-02-28: 650 mg via ORAL
  Filled 2024-02-16: qty 2

## 2024-02-16 MED ORDER — HYDRALAZINE HCL 20 MG/ML IJ SOLN
5.0000 mg | INTRAMUSCULAR | Status: DC | PRN
  Filled 2024-02-16: qty 1

## 2024-02-16 MED ORDER — LACTULOSE 10 GM/15ML PO SOLN
20.0000 g | Freq: Three times a day (TID) | ORAL | Status: AC
  Administered 2024-02-17 (×2): 20 g via ORAL
  Filled 2024-02-16 (×3): qty 30

## 2024-02-16 MED ORDER — PIPERACILLIN-TAZOBACTAM 3.375 G IVPB 30 MIN
3.3750 g | Freq: Once | INTRAVENOUS | Status: AC
  Administered 2024-02-16: 3.375 g via INTRAVENOUS
  Filled 2024-02-16: qty 50

## 2024-02-16 MED ORDER — SODIUM CHLORIDE 0.9 % IV BOLUS
500.0000 mL | Freq: Once | INTRAVENOUS | Status: AC
  Administered 2024-02-16: 500 mL via INTRAVENOUS

## 2024-02-16 MED ORDER — POLYETHYLENE GLYCOL 3350 17 G PO PACK
17.0000 g | PACK | Freq: Two times a day (BID) | ORAL | Status: AC
  Administered 2024-02-16 – 2024-02-18 (×3): 17 g via ORAL
  Filled 2024-02-16 (×4): qty 1

## 2024-02-16 MED ORDER — ALBUTEROL SULFATE (2.5 MG/3ML) 0.083% IN NEBU: 2.5000 mg | INHALATION_SOLUTION | RESPIRATORY_TRACT | Status: DC | PRN

## 2024-02-16 MED ORDER — LATANOPROST 0.005 % OP SOLN
1.0000 [drp] | Freq: Every day | OPHTHALMIC | Status: DC
  Administered 2024-02-16 – 2024-02-29 (×14): 1 [drp] via OPHTHALMIC
  Filled 2024-02-16 (×2): qty 2.5

## 2024-02-16 MED ORDER — PIPERACILLIN-TAZOBACTAM 3.375 G IVPB
3.3750 g | Freq: Three times a day (TID) | INTRAVENOUS | Status: DC
  Administered 2024-02-17 – 2024-02-18 (×5): 3.375 g via INTRAVENOUS
  Filled 2024-02-16 (×5): qty 50

## 2024-02-16 MED ORDER — ONDANSETRON HCL 4 MG PO TABS: 4.0000 mg | ORAL_TABLET | Freq: Four times a day (QID) | ORAL | Status: DC | PRN

## 2024-02-16 MED ORDER — HYDRALAZINE HCL 20 MG/ML IJ SOLN: 5.0000 mg | INTRAMUSCULAR | Status: DC | PRN

## 2024-02-16 MED ORDER — HYDROCODONE-ACETAMINOPHEN 5-325 MG PO TABS
1.0000 | ORAL_TABLET | ORAL | Status: DC | PRN
  Administered 2024-02-16 – 2024-02-17 (×2): 2 via ORAL
  Administered 2024-02-19 – 2024-02-21 (×3): 1 via ORAL
  Filled 2024-02-16: qty 1
  Filled 2024-02-16: qty 2
  Filled 2024-02-16: qty 1
  Filled 2024-02-16: qty 2
  Filled 2024-02-16: qty 1

## 2024-02-16 MED ORDER — IOHEXOL 350 MG/ML SOLN
75.0000 mL | Freq: Once | INTRAVENOUS | Status: AC | PRN
  Administered 2024-02-16: 75 mL via INTRAVENOUS

## 2024-02-16 MED ORDER — ACETAMINOPHEN 650 MG RE SUPP: 650.0000 mg | Freq: Four times a day (QID) | RECTAL | Status: DC | PRN

## 2024-02-16 NOTE — H&P (Signed)
 [facility] H&P   Patient Demographics:    Maria Pace, is a 83 y.o. female  MRN: [id number]   DOB - Apr 25, 1940  Admit Date - 02/16/2024  Outpatient Primary MD for the patient is Gammon, Chrystal, NP  Referring MD/NP/PA: Patsey  Patient coming from: Group home  No chief complaint on file.     HPI:    Maria Pace  is a 83 y.o. female,    with medical history significant of bipolar 1 disorder, schizophrenia, essential hypertension, GERD, hyperlipidemia, history of irregular heartbeat, type 2 diabetes mellitus, PAF on apixaban , HFrEF (EF 30-35%), HTN, patient with hospitalization last July, due to acute anemia from GI bleed, for which she refused endoscopy/colonoscopy . - Was sent from her group home as she was found blood with large amount of diarrhea, dark stools, poor historian due to schizophrenia, she is answering question appropriately but cannot give exact details, she had anemia in July, refused GI evaluation, unfortunately there is no medication list by the patient, by by reviewing dispense record it does appear she is not anymore on her metoprolol  or Eliquis . - In ED she had fever 101.4, CTA GI bleed protocol with no evidence of active extravasation, but was significant for sterile coral colitis, patient had manual disimpaction by ED physician, no stool in color, was followed by gray loose stool, no melena, but she was Hemoccult positive, her globin was stable, creatinine at baseline, [facility] hospitalist consulted to admit    Review of systems:      A full 10 point Review of Systems was done, except as stated above, all other Review of Systems were negative.   With Past History of the following :    Past Medical History:  Diagnosis Date   Bipolar 1 disorder (HCC)    Essential hypertension    GERD (gastroesophageal reflux disease)    Hyperlipidemia    Irregular heart beat     Schizophrenia (HCC)    Type 2 diabetes mellitus (HCC)       Past Surgical History:  Procedure Laterality Date   YAG LASER APPLICATION Left 01/06/2015   Procedure: YAG LASER APPLICATION;  Surgeon: Oneil Platts, MD;  Location: [facility];  Service: Ophthalmology;  Laterality: Left;      Social History:     Social History   Tobacco Use   Smoking status: Never   Smokeless tobacco: Never  Substance Use Topics   Alcohol  use: No        Family History :     Family History  Problem Relation Age of Onset   Hypertension Father    Diabetes type II Father      Home Medications:   Prior to Admission medications   Medication Sig Start Date End Date Taking? Authorizing Provider  amiodarone  (PACERONE ) 200 MG tablet Take 1 tablet (200 mg total) by mouth 2 (two) times daily for 21 days, THEN  1 tablet (200 mg total) daily. Patient taking differently: Take 1 tablet (200 mg total) daily. 11/02/22 11/23/23  Mallipeddi, Vishnu P, MD  ammonium lactate (LAC-HYDRIN) 12 % lotion Apply 1 Application topically as needed for dry skin.    [provider]  ascorbic acid  (VITAMIN C ) 500 MG tablet Take 500 mg by mouth daily.    [provider]  aspirin  EC 81 MG tablet Take 1 tablet (81 mg total) by mouth daily. Swallow whole. 10/26/23 10/25/24  Maree, Pratik D, DO  atorvastatin  (LIPITOR) 20 MG tablet Take 20 mg by mouth daily. 10/05/20   [provider]  Camphor-Eucalyptus-Menthol ([employer] MEDICATED CHEST RUB EX) Apply topically.    [provider]  cholecalciferol  (VITAMIN D3) 10 MCG (400 UNIT) TABS tablet Take 1,000 Units by mouth daily.    [provider]  divalproex  (DEPAKOTE  ER) 250 MG 24 hr tablet Take 1 tablet (250 mg total) by mouth daily. 03/03/23 10/23/23  Vickey Mettle, MD  docusate sodium  (COLACE) 100 MG capsule Take 200 mg by mouth daily.    [provider]  ELIQUIS  5 MG TABS tablet Take 5 mg by mouth 2 (two) times daily. 10/30/23   [provider]  FEROSUL 325 (65 Fe) MG tablet Take 325 mg by mouth 2 (two) times daily with a meal. 10/05/20   [provider]  gabapentin  (NEURONTIN ) 400 MG capsule Take 400 mg by mouth at bedtime.    [provider]  latanoprost  (XALATAN ) 0.005 % ophthalmic solution Place 1 drop into both eyes at bedtime.    [provider]  levothyroxine  (SYNTHROID ) 50 MCG tablet Take 50 mcg by mouth daily. 02/05/24   [provider]  loratadine  (CLARITIN ) 10 MG tablet Take 10 mg by mouth daily.    [provider]  metFORMIN (GLUCOPHAGE) 500 MG tablet Take 500 mg by mouth 2 (two) times daily with a meal.    [provider]  metoprolol  tartrate (LOPRESSOR ) 25 MG tablet Take 25 mg by mouth 2 (two) times daily. 10/30/23   [provider]  Multiple Vitamin (MULTIVITAMIN) tablet Take 1 tablet by mouth daily.    [provider]  OLANZapine  (ZYPREXA ) 20 MG tablet Take 1 tablet (20 mg total) by mouth at bedtime. Total of 25 mg at night. Take along with 20 mg tab Patient taking differently: Take 20 mg by mouth at bedtime. Total of 25 mg at night. Take along with 5 mg tab 04/05/23 10/23/23  Vickey Mettle, MD  OLANZapine  (ZYPREXA ) 5 MG tablet Take 1 tablet (5 mg total) by mouth at bedtime. Total of 25 mg at night. Take along with 20 mg tab 04/05/23 10/23/23  Vickey Mettle, MD  pantoprazole  (PROTONIX ) 40 MG tablet Take 40 mg by mouth daily.    [provider]  polyethylene glycol powder (GLYCOLAX /MIRALAX ) powder Take 17 g by mouth daily as needed for mild constipation or moderate constipation. Patient not taking: Reported on 10/23/2023    [provider]  sacubitril -valsartan  (ENTRESTO ) 24-26 MG Take 1 tablet by mouth 2 (two) times daily. 11/10/22   Mallipeddi, Vishnu P, MD  senna-docusate (SENOKOT-S) 8.6-50 MG tablet Take 2 tablets by mouth at bedtime. 02/23/17   Antoinette Doe, MD  spironolactone  (ALDACTONE ) 25 MG tablet Take 0.5 tablets (12.5  mg total) by mouth daily. 01/20/23 10/23/23  Mallipeddi, Vishnu P, MD  vitamin B-12 (CYANOCOBALAMIN ) 500 MCG tablet Take 1 tablet (500 mcg total) by mouth daily. 10/10/19   Evonnie Lenis, MD  zinc   sulfate 220 (50 Zn) MG capsule Take 220 mg by mouth daily.    [provider]     Allergies:     Allergies  Allergen Reactions   Haloperidol Lactate Other (See Comments)    Couldn't urinate anymore     Physical Exam:   Vitals  Blood pressure 111/74, pulse 92, temperature (!) 100.8 F (38.2 C), resp. rate (!) 26, SpO2 95%.   1. General Frail, elderly female, laying in bed, no apparent distress  2.  Answering question appropriately, wake, alert, oriented x 3, but at some points appear to be with some confusion  3. No F.N deficits, ALL C.Nerves Intact, Strength 5/5 all 4 extremities, Sensation intact all 4 extremities, Plantars down going.  4. Ears and Eyes appear Normal, Conjunctivae clear, PERRLA. Moist Oral Mucosa.  5. Supple Neck, No JVD, No cervical lymphadenopathy appriciated, No Carotid Bruits.  6. Symmetrical Chest wall movement, Good air movement bilaterally, CTAB.  7.  Irregular, No Gallops, Rubs or Murmurs, No Parasternal Heave.  +1 edema  8. Positive Bowel Sounds, Abdomen soft, has lower abdomen tenderness to palpation, No organomegaly appriciated,No rebound -guarding or rigidity.  9.  No Cyanosis, Normal Skin Turgor, No Skin Rash or Bruise.      Data Review:    CBC Recent Labs  Lab 02/16/24 1610  WBC 12.3*  HGB 12.1  HCT 38.0  PLT 219  MCV 92.5  MCH 29.4  MCHC 31.8  RDW 16.2*  LYMPHSABS 1.0  MONOABS 1.6*  EOSABS 0.1  BASOSABS 0.0   ------------------------------------------------------------------------------------------------------------------  Chemistries  Recent Labs  Lab 02/16/24 1610  NA 140  K 4.3  CL 101  CO2 23  GLUCOSE 119*  BUN 34*  CREATININE 1.66*  CALCIUM  8.8*  AST 49*  ALT 33  ALKPHOS 88  BILITOT 0.4    ------------------------------------------------------------------------------------------------------------------ CrCl cannot be calculated (Unknown ideal weight.). ------------------------------------------------------------------------------------------------------------------ No results for input(s): TSH, T4TOTAL, T3FREE, THYROIDAB in the last 72 hours.  Invalid input(s): FREET3  Coagulation profile Recent Labs  Lab 02/16/24 1610  INR 1.0   ------------------------------------------------------------------------------------------------------------------- No results for input(s): DDIMER in the last 72 hours. -------------------------------------------------------------------------------------------------------------------  Cardiac Enzymes No results for input(s): CKMB, TROPONINI, MYOGLOBIN in the last 168 hours.  Invalid input(s): CK ------------------------------------------------------------------------------------------------------------------    Component Value Date/Time   BNP 228.0 (H) 10/20/2023 1605     ---------------------------------------------------------------------------------------------------------------  Urinalysis    Component Value Date/Time   COLORURINE YELLOW 02/16/2024 1437   APPEARANCEUR CLEAR 02/16/2024 1437   APPEARANCEUR Clear 01/06/2022 1100   LABSPEC 1.015 02/16/2024 1437   LABSPEC 1.005 10/28/2013 0457   PHURINE 5.0 02/16/2024 1437   GLUCOSEU NEGATIVE 02/16/2024 1437   GLUCOSEU 50 mg/dL 91/96/7984 9542   HGBUR NEGATIVE 02/16/2024 1437   BILIRUBINUR NEGATIVE 02/16/2024 1437   BILIRUBINUR Negative 01/06/2022 1100   BILIRUBINUR Negative 10/28/2013 0457   KETONESUR NEGATIVE 02/16/2024 1437   PROTEINUR NEGATIVE 02/16/2024 1437   NITRITE NEGATIVE 02/16/2024 1437   LEUKOCYTESUR NEGATIVE 02/16/2024 1437   LEUKOCYTESUR 3+ 10/28/2013 0457     ----------------------------------------------------------------------------------------------------------------   Imaging Results:    CT ANGIO GI BLEED Result Date: 02/16/2024 EXAM: CTA ABDOMEN AND PELVIS WITH CONTRAST 02/16/2024 07:33:33 PM TECHNIQUE: CTA images of the abdomen and pelvis with intravenous contrast. Three-dimensional MIP/volume rendered formations were performed. Automated exposure control, iterative reconstruction, and/or weight based adjustment of the mA/kV was utilized to reduce the radiation dose to as low as reasonably achievable. 75 mL of iohexol  (OMNIPAQUE ) 350 MG/ML injection was administered. COMPARISON: 02/20/2017 CLINICAL  HISTORY: fever. melena FINDINGS: VASCULATURE: GI BLEED: No active contrast extravasation to localize GI bleed. AORTA: Aortic atherosclerosis. No acute finding. No abdominal aortic aneurysm. No dissection. CELIAC TRUNK: No acute finding. No occlusion or significant stenosis. SUPERIOR MESENTERIC ARTERY: No acute finding. No occlusion or significant stenosis. INFERIOR MESENTERIC ARTERY: No acute finding. No occlusion or significant stenosis. RENAL ARTERIES: No acute finding. No occlusion or significant stenosis. ILIAC ARTERIES: No acute finding. No occlusion or significant stenosis. ABDOMEN/PELVIS: LOWER CHEST: Ground glass opacities in the right lower lobe could reflect atelectasis or infiltrates. Mild cardiomegaly. LIVER: The liver is unremarkable. GALLBLADDER AND BILE DUCTS: Layering high-density material within the gallbladder, presumably small stones or sludge. No biliary ductal dilatation. SPLEEN: The spleen is unremarkable. PANCREAS: The pancreas is unremarkable. ADRENAL GLANDS: Bilateral adrenal glands demonstrate no acute abnormality. KIDNEYS, URETERS AND BLADDER: Foley catheter in the bladder, which is decompressed. No stones in the kidneys or ureters. No hydronephrosis. No perinephric or periureteral stranding. GI AND BOWEL: Large stool burden  throughout the colon. Large stool burden in the rectosigmoid colon with wall thickening suggesting stercoral colitis. Stomach and duodenal sweep demonstrate no acute abnormality. There is no bowel obstruction. No abnormal bowel wall distension. REPRODUCTIVE: Reproductive organs are unremarkable. PERITONEUM AND RETROPERITONEUM: No ascites or free air. LYMPH NODES: No lymphadenopathy. BONES AND SOFT TISSUES: No acute abnormality of the bones. No acute soft tissue abnormality. IMPRESSION: 1. No active GI bleeding. 2. Large stool burden in the rectosigmoid colon with wall thickening, suggestive of stercoral colitis. 3. Right lower lobe ground-glass opacities, which may represent atelectasis or infiltrate. 4. Mild cardiomegaly. 5. Cholelithiasis Electronically signed by: Franky Crease MD 02/16/2024 07:39 PM EST RP Workstation: [id number]   DG Chest Portable 1 View Result Date: 02/16/2024 CLINICAL DATA:  Altered mental status.  Gastrointestinal bleeding. EXAM: PORTABLE CHEST 1 VIEW COMPARISON:  10/20/2023. FINDINGS: Very poor depth of inspiration. Stable enlarged cardiac silhouette. Clear lungs with normal vascularity. Diffuse osteopenia. Moderate right glenohumeral degenerative changes. IMPRESSION: 1. No acute abnormality. 2. Stable cardiomegaly. Electronically Signed   By: Elspeth Bathe M.D.   On: 02/16/2024 15:07     EKG:  Vent. rate 105 BPM PR interval * ms QRS duration 133 ms QT/QTcB 376/497 ms P-R-T axes * 104 -51 Atrial fibrillation Nonspecific intraventricular conduction delay Borderline repolarization abnormality back in afib Confirmed by Dean Clarity [phone]    Assessment & Plan:    Principal Problem:   Stercoral colitis Active Problems:   Hypertension   Type 2 diabetes mellitus (HCC)   Severe dehydration   Schizophrenia (HCC)   GERD (gastroesophageal reflux disease)   SVT (supraventricular tachycardia)   PAF (paroxysmal atrial fibrillation) (HCC)   Chronic HFrEF (heart failure  with reduced ejection fraction) (HCC)    Sepsis, POA Sterocoral colitis Hemoccult positive stool -Sepsis POA, febrile 1.4, with leukocytosis and tachypnea  -Occult positive stool, but hemoglobin is stable, had manual disimpaction in ED, impacted stool was brown in color, no melena or bright red blood per rectum, followed by diarrhea dark gray in color, no melena, Hemoccult positive stool most likely due to colitis, as well she is on iron  tablets. -Follow-up blood cultures -Most likely in the setting of stercoral colitis -Continue with IV Zosyn . - Will start on laxative regimen - She is on aspirin , will hold -Will consult GI due to stercoral colitis and Hemoccult positive stool -CTA GI bleeding protocol is negative   Chronic HFrEF - 09/26/2023 echo EF 30-35%, normal RVF, moderate MR - Patient with lower  extremity edema -Given she presents with sepsis we will hold on diuresis -Pressure is soft we will hold on Entresto  -Does appear her metoprolol  has been discontinued due to soft blood pressure in the past -Resume Aldactone  once stable   Paroxysmal atrial fibrillation - Currently in sinus rhythm - Continue with amiodarone  -By reviewing records it does appear she has not been on Eliquis  for few months currently, and I would assume this is most likely in the setting of her previous anemia due to GI bleed and refusing anemia workup as it does appear risks outweighed benefits anticoagulation   Schizophrenia, unspecified type - Continue olanzapine  - Continue Depakote    CKD stage IIIb - Creatinine at baseline   Diabetes mellitus type 2, controlled - A1c, hold metformin and keep on insulin  sliding scale - NovoLog  sliding scale - Holding metformin   Mixed hyperlipidemia - Continue statin     DVT Prophylaxis Heparin  -  Lovenox  -AM Labs Ordered, also please review Full Orders  Family Communication: Admission, patients condition and plan of care including tests being ordered have  been discussed with the patient (I was unable to reach Margrette Notice of contact, as well unable to leave a voice message) who indicate understanding and agree with the plan and Code Status.  Code Status full code  Likely DC to back to group home  Consults called: GI requested in epic  Admission status: Inpatient  Time spent in minutes : 70 minutes   Brayton Lye M.D on 02/16/2024 at 8:27 PM   [facility] - Office  [phone]

## 2024-02-16 NOTE — ED Provider Notes (Addendum)
  Physical Exam  BP 116/67   Pulse 91   Temp (!) 101.4 F (38.6 C)   Resp (!) 25   SpO2 98%   Physical Exam  Procedures  Procedures  ED Course / MDM    Medical Decision Making Amount and/or Complexity of Data Reviewed Labs: ordered. Radiology: ordered.  Risk Prescription drug management. Decision regarding hospitalization.   Received in signout.  Melena.  However now has developed fevers.  Patient denies abdominal pain but does appear to have some distention.  Will get CT scan to evaluate for both the bleeding and fever.  Hemoglobin reassuring.  CT scan showed potential pneumonia, however also stercoral colitis.  Manual disimpaction done and did have soft stool without definite impaction.  Will give antibiotics.  Zosyn  given.  Continued fever.  Urine does not show infection.  Chest x-ray does not show infection although potential pneumonia on CT.  Will now add blood cultures and lactic acid.  Will recheck hemoglobin.  Will admit to hospitalist.         Patsey Lot, MD 02/16/24 [id number]    Patsey Lot, MD 02/16/24 2008

## 2024-02-16 NOTE — ED Provider Notes (Signed)
 [place] EMERGENCY DEPARTMENT AT [facility] Provider Note   CSN: [id number] Arrival date & time: 02/16/24  1332     Patient presents with: No chief complaint on file.   Maria Pace is a 83 y.o. female.   Pt is a 83 yo female with pmhx significant for schizophrenia, bipolar 1 d/o, GI bleed, afib (not on thinners due to GIB), GERD, HTN, HLD, and DM2.  Pt lives in a group home and was found today covered in large volume, black stool.  Pt is unable to give any hx.  She had symptomatic anemia back in July, but refused GI eval.       Prior to Admission medications   Medication Sig Start Date End Date Taking? Authorizing Provider  amiodarone  (PACERONE ) 200 MG tablet Take 1 tablet (200 mg total) by mouth 2 (two) times daily for 21 days, THEN 1 tablet (200 mg total) daily. Patient taking differently: Take 1 tablet (200 mg total) daily. 11/02/22 11/23/23  Mallipeddi, Vishnu P, MD  ammonium lactate (LAC-HYDRIN) 12 % lotion Apply 1 Application topically as needed for dry skin.    [provider]  ascorbic acid  (VITAMIN C ) 500 MG tablet Take 500 mg by mouth daily.    [provider]  aspirin  EC 81 MG tablet Take 1 tablet (81 mg total) by mouth daily. Swallow whole. 10/26/23 10/25/24  Maree, Pratik D, DO  atorvastatin  (LIPITOR) 20 MG tablet Take 20 mg by mouth daily. 10/05/20   [provider]  Camphor-Eucalyptus-Menthol ([employer] MEDICATED CHEST RUB EX) Apply topically.    [provider]  cholecalciferol  (VITAMIN D3) 10 MCG (400 UNIT) TABS tablet Take 1,000 Units by mouth daily.    [provider]  divalproex  (DEPAKOTE  ER) 250 MG 24 hr tablet Take 1 tablet (250 mg total) by mouth daily. 03/03/23 10/23/23  Vickey Mettle, MD  docusate sodium  (COLACE) 100 MG capsule Take 200 mg by mouth daily.    [provider]  ELIQUIS  5 MG TABS tablet Take 5 mg by mouth 2 (two) times daily. 10/30/23   [provider]  FEROSUL 325 (65 Fe) MG tablet  Take 325 mg by mouth 2 (two) times daily with a meal. 10/05/20   [provider]  gabapentin  (NEURONTIN ) 400 MG capsule Take 400 mg by mouth at bedtime.    [provider]  latanoprost  (XALATAN ) 0.005 % ophthalmic solution Place 1 drop into both eyes at bedtime.    [provider]  levothyroxine  (SYNTHROID ) 50 MCG tablet Take 50 mcg by mouth daily. 02/05/24   [provider]  loratadine  (CLARITIN ) 10 MG tablet Take 10 mg by mouth daily.    [provider]  metFORMIN (GLUCOPHAGE) 500 MG tablet Take 500 mg by mouth 2 (two) times daily with a meal.    [provider]  metoprolol  tartrate (LOPRESSOR ) 25 MG tablet Take 25 mg by mouth 2 (two) times daily. 10/30/23   [provider]  Multiple Vitamin (MULTIVITAMIN) tablet Take 1 tablet by mouth daily.    [provider]  OLANZapine  (ZYPREXA ) 20 MG tablet Take 1 tablet (20 mg total) by mouth at bedtime. Total of 25 mg at night. Take along with 20 mg tab Patient taking differently: Take 20 mg by mouth at bedtime. Total of 25 mg at night. Take along with 5 mg tab 04/05/23 10/23/23  Vickey Mettle, MD  OLANZapine  (ZYPREXA ) 5 MG tablet Take 1 tablet (5 mg total) by mouth at bedtime. Total of  25 mg at night. Take along with 20 mg tab 04/05/23 10/23/23  Vickey Mettle, MD  pantoprazole  (PROTONIX ) 40 MG tablet Take 40 mg by mouth daily.    [provider]  polyethylene glycol powder (GLYCOLAX /MIRALAX ) powder Take 17 g by mouth daily as needed for mild constipation or moderate constipation. Patient not taking: Reported on 10/23/2023    [provider]  sacubitril -valsartan  (ENTRESTO ) 24-26 MG Take 1 tablet by mouth 2 (two) times daily. 11/10/22   Mallipeddi, Vishnu P, MD  senna-docusate (SENOKOT-S) 8.6-50 MG tablet Take 2 tablets by mouth at bedtime. 02/23/17   Antoinette Doe, MD  spironolactone  (ALDACTONE ) 25 MG tablet Take 0.5 tablets (12.5 mg total) by mouth daily. 01/20/23 10/23/23   Mallipeddi, Vishnu P, MD  vitamin B-12 (CYANOCOBALAMIN ) 500 MCG tablet Take 1 tablet (500 mcg total) by mouth daily. 10/10/19   Evonnie Lenis, MD  zinc  sulfate 220 (50 Zn) MG capsule Take 220 mg by mouth daily.    [provider]    Allergies: Haloperidol lactate    Review of Systems  Unable to perform ROS: Mental status change  All other systems reviewed and are negative.   Updated Vital Signs BP (!) 116/58   Pulse 88   Temp 100 F (37.8 C)   Resp (!) 21   SpO2 97%   Physical Exam Vitals and nursing note reviewed.  Constitutional:      Appearance: Normal appearance.  HENT:     Head: Normocephalic and atraumatic.     Right Ear: External ear normal.     Left Ear: External ear normal.     Nose: Nose normal.     Mouth/Throat:     Mouth: Mucous membranes are moist.     Pharynx: Oropharynx is clear.  Eyes:     Extraocular Movements: Extraocular movements intact.     Conjunctiva/sclera: Conjunctivae normal.     Pupils: Pupils are equal, round, and reactive to light.  Cardiovascular:     Rate and Rhythm: Normal rate. Rhythm irregular.     Pulses: Normal pulses.     Heart sounds: Normal heart sounds.  Pulmonary:     Effort: Pulmonary effort is normal.     Breath sounds: Normal breath sounds.  Abdominal:     General: Abdomen is flat. Bowel sounds are normal.     Palpations: Abdomen is soft.     Comments: Pt is covered in black stool from waist to toes  Genitourinary:    Rectum: Guaiac result positive.  Musculoskeletal:        General: Normal range of motion.     Cervical back: Normal range of motion and neck supple.  Skin:    General: Skin is warm.     Capillary Refill: Capillary refill takes less than 2 seconds.  Neurological:     Mental Status: She is alert. She is disoriented.     (all labs ordered are listed, but only abnormal results are displayed) Labs Reviewed  POC OCCULT BLOOD, ED - Abnormal; Notable for the following components:      Result Value    Fecal Occult Bld POSITIVE (*)    All other components within normal limits  CBG MONITORING, ED - Abnormal; Notable for the following components:   Glucose-Capillary 122 (*)    All other components within normal limits  URINALYSIS, ROUTINE W REFLEX MICROSCOPIC  CBC WITH DIFFERENTIAL/PLATELET  COMPREHENSIVE METABOLIC PANEL WITH GFR  PROTIME-INR  VALPROIC  ACID LEVEL  I-STAT CG4 LACTIC ACID, ED  TYPE  AND SCREEN    EKG: EKG Interpretation Date/Time:  Friday February 16 2024 14:37:13 EST Ventricular Rate:  105 PR Interval:    QRS Duration:  133 QT Interval:  376 QTC Calculation: 497 R Axis:   104  Text Interpretation: Atrial fibrillation Nonspecific intraventricular conduction delay Borderline repolarization abnormality back in afib Confirmed by Dean Clarity [phone]) on 02/16/2024 3:17:39 PM  Radiology: [facility] Chest Portable 1 View Result Date: 02/16/2024 CLINICAL DATA:  Altered mental status.  Gastrointestinal bleeding. EXAM: PORTABLE CHEST 1 VIEW COMPARISON:  10/20/2023. FINDINGS: Very poor depth of inspiration. Stable enlarged cardiac silhouette. Clear lungs with normal vascularity. Diffuse osteopenia. Moderate right glenohumeral degenerative changes. IMPRESSION: 1. No acute abnormality. 2. Stable cardiomegaly. Electronically Signed   By: Elspeth Bathe M.D.   On: 02/16/2024 15:07     Procedures   Medications Ordered in the ED  sodium chloride  0.9 % bolus 500 mL (has no administration in time range)                                    Medical Decision Making Amount and/or Complexity of Data Reviewed Labs: ordered. Radiology: ordered.   This patient presents to the ED for concern of ams, this involves an extensive number of treatment options, and is a complaint that carries with it a high risk of complications and morbidity.  The differential diagnosis includes gi bleed, symptomatic anemia   Co morbidities that complicate the patient evaluation  schizophrenia, bipolar 1  d/o, GI bleed, GERD, HTN, HLD, and DM2   Additional history obtained:  Additional history obtained from epic chart review External records from outside source obtained and reviewed including EMS report   Lab Tests:  I Ordered, and personally interpreted labs.  The pertinent results include:  ua neg; blood work labs pending at shift change   Imaging Studies ordered:  I ordered imaging studies including cxr  I independently visualized and interpreted imaging which showed  No acute abnormality.  2. Stable cardiomegaly.   I agree with the radiologist interpretation   Cardiac Monitoring:  The patient was maintained on a cardiac monitor.  I personally viewed and interpreted the cardiac monitored which showed an underlying rhythm of: afib   Medicines ordered and prescription drug management:   I have reviewed the patients home medicines and have made adjustments as needed   Problem List / ED Course:  Melena:  labs are pending.  I suspect she will need admission.   Reevaluation:  After the interventions noted above, I reevaluated the patient and found that they have :improved   Social Determinants of Health:  Lives in a group home   Dispostion:  After consideration of the diagnostic results and the patients response to treatment, I feel that the patent would benefit from admission.       Final diagnoses:  Gastrointestinal hemorrhage, unspecified gastrointestinal hemorrhage type    ED Discharge Orders     None          Dean Clarity, MD 02/16/24 [phone]

## 2024-02-16 NOTE — ED Notes (Signed)
 Lab in room to obtain ordered specimens.

## 2024-02-17 ENCOUNTER — Encounter (HOSPITAL_COMMUNITY): Payer: Self-pay | Admitting: Internal Medicine

## 2024-02-17 ENCOUNTER — Other Ambulatory Visit: Payer: Self-pay

## 2024-02-17 DIAGNOSIS — K5909 Other constipation: Secondary | ICD-10-CM | POA: Diagnosis not present

## 2024-02-17 DIAGNOSIS — D649 Anemia, unspecified: Secondary | ICD-10-CM

## 2024-02-17 DIAGNOSIS — K5289 Other specified noninfective gastroenteritis and colitis: Secondary | ICD-10-CM | POA: Diagnosis not present

## 2024-02-17 LAB — BASIC METABOLIC PANEL WITH GFR
Anion gap: 13 (ref 5–15)
BUN: 35 mg/dL — ABNORMAL HIGH (ref 8–23)
CO2: 22 mmol/L (ref 22–32)
Calcium: 8.3 mg/dL — ABNORMAL LOW (ref 8.9–10.3)
Chloride: 102 mmol/L (ref 98–111)
Creatinine, Ser: 1.68 mg/dL — ABNORMAL HIGH (ref 0.44–1.00)
GFR, Estimated: 30 mL/min — ABNORMAL LOW (ref >60)
Glucose, Bld: 76 mg/dL (ref 70–99)
Potassium: 4.3 mmol/L (ref 3.5–5.1)
Sodium: 137 mmol/L (ref 135–145)

## 2024-02-17 LAB — GLUCOSE, CAPILLARY
Glucose-Capillary: 77 mg/dL (ref 70–99)
Glucose-Capillary: 156 mg/dL — ABNORMAL HIGH (ref 70–99)
Glucose-Capillary: 126 mg/dL — ABNORMAL HIGH (ref 70–99)
Glucose-Capillary: 148 mg/dL — ABNORMAL HIGH (ref 70–99)

## 2024-02-17 LAB — CBC
HCT: 32.1 % — ABNORMAL LOW (ref 36.0–46.0)
Hemoglobin: 10.3 g/dL — ABNORMAL LOW (ref 12.0–15.0)
MCH: 29.7 pg (ref 26.0–34.0)
MCHC: 32.1 g/dL (ref 30.0–36.0)
MCV: 92.5 fL (ref 80.0–100.0)
Platelets: 187 K/uL (ref 150–400)
RBC: 3.47 MIL/uL — ABNORMAL LOW (ref 3.87–5.11)
RDW: 16.3 % — ABNORMAL HIGH (ref 11.5–15.5)
WBC: 10.9 K/uL — ABNORMAL HIGH (ref 4.0–10.5)
nRBC: 0 % (ref 0.0–0.2)

## 2024-02-17 LAB — LACTIC ACID, PLASMA: Lactic Acid, Venous: 2 mmol/L (ref 0.5–1.9)

## 2024-02-17 MED ORDER — ZINC OXIDE 40 % EX OINT
TOPICAL_OINTMENT | Freq: Every day | CUTANEOUS | Status: DC
  Administered 2024-02-19 – 2024-02-24 (×2): 1 via TOPICAL
  Filled 2024-02-17: qty 57

## 2024-02-17 MED ORDER — INSULIN ASPART 100 UNIT/ML IJ SOLN
0.0000 [IU] | Freq: Three times a day (TID) | INTRAMUSCULAR | Status: DC
  Administered 2024-02-17 (×2): 1 [IU] via SUBCUTANEOUS
  Administered 2024-02-17 – 2024-02-18 (×2): 2 [IU] via SUBCUTANEOUS
  Administered 2024-02-18: 1 [IU] via SUBCUTANEOUS
  Administered 2024-02-18 (×2): 3 [IU] via SUBCUTANEOUS
  Administered 2024-02-19: 1 [IU] via SUBCUTANEOUS
  Administered 2024-02-19: 2 [IU] via SUBCUTANEOUS
  Administered 2024-02-19 – 2024-02-20 (×3): 1 [IU] via SUBCUTANEOUS
  Administered 2024-02-21: 2 [IU] via SUBCUTANEOUS
  Administered 2024-02-22: 3 [IU] via SUBCUTANEOUS
  Administered 2024-02-22: 1 [IU] via SUBCUTANEOUS
  Administered 2024-02-23: 2 [IU] via SUBCUTANEOUS
  Administered 2024-02-23 (×2): 1 [IU] via SUBCUTANEOUS
  Administered 2024-02-24 (×2): 2 [IU] via SUBCUTANEOUS
  Administered 2024-02-25: 3 [IU] via SUBCUTANEOUS
  Administered 2024-02-25 (×2): 2 [IU] via SUBCUTANEOUS
  Administered 2024-02-26 (×2): 1 [IU] via SUBCUTANEOUS
  Administered 2024-02-27 – 2024-02-28 (×3): 2 [IU] via SUBCUTANEOUS
  Administered 2024-02-28: 7 [IU] via SUBCUTANEOUS
  Administered 2024-02-28: 1 [IU] via SUBCUTANEOUS
  Administered 2024-02-28: 2 [IU] via SUBCUTANEOUS
  Administered 2024-02-29: 3 [IU] via SUBCUTANEOUS
  Administered 2024-02-29 (×2): 1 [IU] via SUBCUTANEOUS
  Filled 2024-02-17 (×34): qty 1

## 2024-02-17 MED ORDER — CHLORHEXIDINE GLUCONATE CLOTH 2 % EX PADS
6.0000 | MEDICATED_PAD | Freq: Every day | CUTANEOUS | Status: DC
  Administered 2024-02-17 – 2024-03-01 (×12): 6 via TOPICAL

## 2024-02-17 NOTE — Progress Notes (Incomplete)
 Pt arrived from the ED to this unit and has been admitted to room 327. Pt is alert, verbal oriented x3. Disoriented to time. Pt states that she isnt currently having pain but has been having loose BM's and c/o rectal pain if she does have a BM. Pts vitals are stable. Resting in bed with call light within reach.

## 2024-02-17 NOTE — Plan of Care (Signed)
   Problem: Education: Goal: Knowledge of General Education information will improve Description: Including pain rating scale, medication(s)/side effects and non-pharmacologic comfort measures Outcome: Progressing   Problem: Health Behavior/Discharge Planning: Goal: Ability to manage health-related needs will improve Outcome: Progressing   Problem: Clinical Measurements: Goal: Ability to maintain clinical measurements within normal limits will improve Outcome: Progressing Goal: Will remain free from infection Outcome: Progressing Goal: Diagnostic test results will improve Outcome: Progressing Goal: Respiratory complications will improve Outcome: Progressing Goal: Cardiovascular complication will be avoided Outcome: Progressing   Problem: Activity: Goal: Risk for activity intolerance will decrease Outcome: Progressing   Problem: Nutrition: Goal: Adequate nutrition will be maintained Outcome: Progressing   Problem: Coping: Goal: Level of anxiety will decrease Outcome: Progressing   Problem: Elimination: Goal: Will not experience complications related to bowel motility Outcome: Progressing Goal: Will not experience complications related to urinary retention Outcome: Progressing   Problem: Pain Managment: Goal: General experience of comfort will improve and/or be controlled Outcome: Progressing   Problem: Safety: Goal: Ability to remain free from injury will improve Outcome: Progressing   Problem: Skin Integrity: Goal: Risk for impaired skin integrity will decrease Outcome: Progressing   Problem: Education: Goal: Ability to describe self-care measures that may prevent or decrease complications (Diabetes Survival Skills Education) will improve Outcome: Progressing Goal: Individualized Educational Video(s) Outcome: Progressing   Problem: Coping: Goal: Ability to adjust to condition or change in health will improve Outcome: Progressing   Problem: Fluid  Volume: Goal: Ability to maintain a balanced intake and output will improve Outcome: Progressing   Problem: Health Behavior/Discharge Planning: Goal: Ability to identify and utilize available resources and services will improve Outcome: Progressing Goal: Ability to manage health-related needs will improve Outcome: Progressing   Problem: Metabolic: Goal: Ability to maintain appropriate glucose levels will improve Outcome: Progressing   Problem: Nutritional: Goal: Maintenance of adequate nutrition will improve Outcome: Progressing Goal: Progress toward achieving an optimal weight will improve Outcome: Progressing   Problem: Skin Integrity: Goal: Risk for impaired skin integrity will decrease Outcome: Progressing   Problem: Tissue Perfusion: Goal: Adequacy of tissue perfusion will improve Outcome: Progressing   Problem: Education: Goal: Ability to describe self-care measures that may prevent or decrease complications (Diabetes Survival Skills Education) will improve Outcome: Progressing Goal: Individualized Educational Video(s) Outcome: Progressing   Problem: Coping: Goal: Ability to adjust to condition or change in health will improve Outcome: Progressing   Problem: Fluid Volume: Goal: Ability to maintain a balanced intake and output will improve Outcome: Progressing   Problem: Health Behavior/Discharge Planning: Goal: Ability to identify and utilize available resources and services will improve Outcome: Progressing Goal: Ability to manage health-related needs will improve Outcome: Progressing   Problem: Metabolic: Goal: Ability to maintain appropriate glucose levels will improve Outcome: Progressing   Problem: Nutritional: Goal: Maintenance of adequate nutrition will improve Outcome: Progressing Goal: Progress toward achieving an optimal weight will improve Outcome: Progressing   Problem: Skin Integrity: Goal: Risk for impaired skin integrity will  decrease Outcome: Progressing   Problem: Tissue Perfusion: Goal: Adequacy of tissue perfusion will improve Outcome: Progressing

## 2024-02-17 NOTE — Progress Notes (Signed)
 Money found on the floor next to patient's bed by Harlene Laos, NT and [employer], NT[employer]. Sent to security for safe keeping.

## 2024-02-17 NOTE — Progress Notes (Signed)
 PROGRESS NOTE    [name]  FMW:[id number] DOB: 14-Jan-1941 DOA: 02/16/2024 PCP: Wilmon Penton, NP   Brief Narrative:    Maria Pace  is a 83 y.o. female,    with medical history significant of bipolar 1 disorder, schizophrenia, essential hypertension, GERD, hyperlipidemia, history of irregular heartbeat, type 2 diabetes mellitus, PAF on apixaban , HFrEF (EF 30-35%), HTN, patient with hospitalization last July, due to acute anemia from GI bleed, for which she refused endoscopy/colonoscopy . - Was sent from her group home as she was found blood with large amount of diarrhea, dark stools, poor historian due to schizophrenia, she is answering question appropriately but cannot give exact details, she had anemia in July, refused GI evaluation, unfortunately there is no medication list by the patient, by by reviewing dispense record it does appear she is not anymore on her metoprolol  or Eliquis .  Patient has been admitted for stercoral colitis with manual disimpaction performed in the ED.  She remains on IV Zosyn  and has been seen by GI with plans to advance diet as tolerated as well as aggressive bowel regimen.  She has previously refused any endoscopy and plans are for supportive care as well as potential SNF on discharge.  Assessment & Plan:   Principal Problem:   Stercoral colitis Active Problems:   Hypertension   Type 2 diabetes mellitus (HCC)   Severe dehydration   Schizophrenia (HCC)   GERD (gastroesophageal reflux disease)   PAF (paroxysmal atrial fibrillation) (HCC)   Chronic HFrEF (heart failure with reduced ejection fraction) (HCC)  Assessment and Plan:  Sepsis, POA Sterocoral colitis Hemoccult positive stool -Sepsis POA, febrile 1.4, with leukocytosis and tachypnea  -Occult positive stool, but hemoglobin is stable, had manual disimpaction in ED, impacted stool was brown in color, no melena or bright red blood per rectum, followed by diarrhea dark gray in color, no  melena, Hemoccult positive stool most likely due to colitis, as well she is on iron  tablets. -Follow-up blood cultures with no growth at this time -Most likely in the setting of stercoral colitis -Continue with IV Zosyn . - Continue on laxative regimen - She is on aspirin , will hold - Appreciate GI recommendations to conservatively manage and follow CBC -CTA GI bleeding protocol is negative   Chronic HFrEF - 09/26/2023 echo EF 30-35%, normal RVF, moderate MR - Patient with lower extremity edema -Given she presents with sepsis we will hold on diuresis -Pressure is soft we will hold on Entresto  -Does appear her metoprolol  has been discontinued due to soft blood pressure in the past -Resume Aldactone  once stable, continue to hold for now   Paroxysmal atrial fibrillation - Currently in sinus rhythm - Continue with amiodarone  -By reviewing records it does appear she has not been on Eliquis  for few months currently, and I would assume this is most likely in the setting of her previous anemia due to GI bleed and refusing anemia workup as it does appear risks outweighed benefits anticoagulation   Schizophrenia, unspecified type - Continue olanzapine  - Continue Depakote    CKD stage IIIb - Creatinine at baseline   Diabetes mellitus type 2, controlled - A1c, hold metformin and keep on insulin  sliding scale - NovoLog  sliding scale - Holding metformin   Mixed hyperlipidemia - Continue statin  Morbid obesity-class III BMI 41.89  DVT prophylaxis:SCDs Code Status: Full Family Communication: None at bedside Disposition Plan:  Status is: Inpatient Remains inpatient appropriate because: Need for IV medications.   Consultants:  GI  Procedures:  None  Antimicrobials:  Anti-infectives (From admission, onward)    Start     Dose/Rate Route Frequency Ordered Stop   02/17/24 0400  piperacillin -tazobactam (ZOSYN ) IVPB 3.375 g        3.375 g 12.5 mL/hr over 240 Minutes Intravenous Every  8 hours 02/16/24 2126     02/16/24 2000  piperacillin -tazobactam (ZOSYN ) IVPB 3.375 g        3.375 g 100 mL/hr over 30 Minutes Intravenous  Once 02/16/24 1949 02/16/24 2058       Subjective: Patient seen and evaluated today with no new acute complaints or concerns. No acute concerns or events noted overnight.  She appears to be tolerating clear liquids and denies any abdominal pain.  Objective: Vitals:   02/16/24 2112 02/17/24 0053 02/17/24 0057 02/17/24 0500  BP: 134/65 (!) 87/54 105/62 122/84  Pulse: 90 81 83 80  Resp: 18 16  18   Temp: 97.9 F (36.6 C) (!) 97.5 F (36.4 C)  98.3 F (36.8 C)  TempSrc: Oral Oral  Oral  SpO2: 97% 93%  96%  Weight: 97.3 kg     Height: 5' (1.524 m)       Intake/Output Summary (Last 24 hours) at 02/17/2024 0746 Last data filed at 02/17/2024 0551 Gross per 24 hour  Intake 1080 ml  Output 1400 ml  Net -320 ml   Filed Weights   02/16/24 2112  Weight: 97.3 kg    Examination:  General exam: Appears calm and comfortable, obese Respiratory system: Clear to auscultation. Respiratory effort normal.  Nasal cannula oxygen Cardiovascular system: S1 & S2 heard, RRR.  Gastrointestinal system: Abdomen is soft Central nervous system: Alert and awake Extremities: No edema Skin: No significant lesions noted Psychiatry: Flat affect.    Data Reviewed: I have personally reviewed following labs and imaging studies  CBC: Recent Labs  Lab 02/16/24 1610 02/16/24 2025 02/17/24 0445  WBC 12.3* 11.5* 10.9*  NEUTROABS 9.5*  --   --   HGB 12.1 10.8* 10.3*  HCT 38.0 33.7* 32.1*  MCV 92.5 92.8 92.5  PLT 219 202 187   Basic Metabolic Panel: Recent Labs  Lab 02/16/24 1610 02/17/24 0445  NA 140 137  K 4.3 4.3  CL 101 102  CO2 23 22  GLUCOSE 119* 76  BUN 34* 35*  CREATININE 1.66* 1.68*  CALCIUM  8.8* 8.3*   GFR: Estimated Creatinine Clearance: 27.4 mL/min (A) (by C-G formula based on SCr of 1.68 mg/dL (H)). Liver Function Tests: Recent Labs   Lab 02/16/24 1610  AST 49*  ALT 33  ALKPHOS 88  BILITOT 0.4  PROT 7.0  ALBUMIN 3.6   No results for input(s): LIPASE, AMYLASE in the last 168 hours. No results for input(s): AMMONIA in the last 168 hours. Coagulation Profile: Recent Labs  Lab 02/16/24 1610  INR 1.0   Cardiac Enzymes: No results for input(s): CKTOTAL, CKMB, CKMBINDEX, TROPONINI in the last 168 hours. BNP (last 3 results) No results for input(s): PROBNP in the last 8760 hours. HbA1C: No results for input(s): HGBA1C in the last 72 hours. CBG: Recent Labs  Lab 02/16/24 1345 02/17/24 0717  GLUCAP 122* 77   Lipid Profile: No results for input(s): CHOL, HDL, LDLCALC, TRIG, CHOLHDL, LDLDIRECT in the last 72 hours. Thyroid  Function Tests: No results for input(s): TSH, T4TOTAL, FREET4, T3FREE, THYROIDAB in the last 72 hours. Anemia Panel: No results for input(s): VITAMINB12, FOLATE, FERRITIN, TIBC, IRON , RETICCTPCT in the last 72 hours. Sepsis Labs: Recent Labs  Lab 02/16/24 2025 02/16/24 2327  LATICACIDVEN 3.1* 2.0*    No results found for this or any previous visit (from the past 240 hours).       Radiology Studies: CT ANGIO GI BLEED Result Date: 02/16/2024 EXAM: CTA ABDOMEN AND PELVIS WITH CONTRAST 02/16/2024 07:33:33 PM TECHNIQUE: CTA images of the abdomen and pelvis with intravenous contrast. Three-dimensional MIP/volume rendered formations were performed. Automated exposure control, iterative reconstruction, and/or weight based adjustment of the mA/kV was utilized to reduce the radiation dose to as low as reasonably achievable. 75 mL of iohexol  (OMNIPAQUE ) 350 MG/ML injection was administered. COMPARISON: 02/20/2017 CLINICAL HISTORY: fever. melena FINDINGS: VASCULATURE: GI BLEED: No active contrast extravasation to localize GI bleed. AORTA: Aortic atherosclerosis. No acute finding. No abdominal aortic aneurysm. No dissection. CELIAC TRUNK: No acute  finding. No occlusion or significant stenosis. SUPERIOR MESENTERIC ARTERY: No acute finding. No occlusion or significant stenosis. INFERIOR MESENTERIC ARTERY: No acute finding. No occlusion or significant stenosis. RENAL ARTERIES: No acute finding. No occlusion or significant stenosis. ILIAC ARTERIES: No acute finding. No occlusion or significant stenosis. ABDOMEN/PELVIS: LOWER CHEST: Ground glass opacities in the right lower lobe could reflect atelectasis or infiltrates. Mild cardiomegaly. LIVER: The liver is unremarkable. GALLBLADDER AND BILE DUCTS: Layering high-density material within the gallbladder, presumably small stones or sludge. No biliary ductal dilatation. SPLEEN: The spleen is unremarkable. PANCREAS: The pancreas is unremarkable. ADRENAL GLANDS: Bilateral adrenal glands demonstrate no acute abnormality. KIDNEYS, URETERS AND BLADDER: Foley catheter in the bladder, which is decompressed. No stones in the kidneys or ureters. No hydronephrosis. No perinephric or periureteral stranding. GI AND BOWEL: Large stool burden throughout the colon. Large stool burden in the rectosigmoid colon with wall thickening suggesting stercoral colitis. Stomach and duodenal sweep demonstrate no acute abnormality. There is no bowel obstruction. No abnormal bowel wall distension. REPRODUCTIVE: Reproductive organs are unremarkable. PERITONEUM AND RETROPERITONEUM: No ascites or free air. LYMPH NODES: No lymphadenopathy. BONES AND SOFT TISSUES: No acute abnormality of the bones. No acute soft tissue abnormality. IMPRESSION: 1. No active GI bleeding. 2. Large stool burden in the rectosigmoid colon with wall thickening, suggestive of stercoral colitis. 3. Right lower lobe ground-glass opacities, which may represent atelectasis or infiltrate. 4. Mild cardiomegaly. 5. Cholelithiasis Electronically signed by: Franky Crease MD 02/16/2024 07:39 PM EST RP Workstation: [id number]   DG Chest Portable 1 View Result Date:  02/16/2024 CLINICAL DATA:  Altered mental status.  Gastrointestinal bleeding. EXAM: PORTABLE CHEST 1 VIEW COMPARISON:  10/20/2023. FINDINGS: Very poor depth of inspiration. Stable enlarged cardiac silhouette. Clear lungs with normal vascularity. Diffuse osteopenia. Moderate right glenohumeral degenerative changes. IMPRESSION: 1. No acute abnormality. 2. Stable cardiomegaly. Electronically Signed   By: Elspeth Bathe M.D.   On: 02/16/2024 15:07        Scheduled Meds:  divalproex   250 mg Oral Daily   insulin  aspart  0-9 Units Subcutaneous TID AC & HS   lactulose   20 g Oral TID   latanoprost   1 drop Both Eyes QHS   levothyroxine   50 mcg Oral Q0600   OLANZapine   20 mg Oral QHS   OLANZapine   5 mg Oral QHS   pantoprazole   40 mg Oral BID   polyethylene glycol  17 g Oral BID   senna-docusate  1 tablet Oral BID   Continuous Infusions:  piperacillin -tazobactam (ZOSYN )  IV Stopped (02/17/24 0551)     LOS: 1 day    Time spent: 55 minutes    Joeanne Robicheaux D Maree, DO [facility]  If 7PM-7AM, please contact night-coverage [email or web address] 02/17/2024, 7:46  AM

## 2024-02-17 NOTE — Plan of Care (Signed)
  Problem: Acute Rehab PT Goals(only PT should resolve) Goal: Pt Will Go Supine/Side To Sit Outcome: Progressing Flowsheets (Taken 02/17/2024 1218) Pt will go Supine/Side to Sit: with minimal assist Goal: Patient Will Transfer Sit To/From Stand Outcome: Progressing Flowsheets (Taken 02/17/2024 1218) Patient will transfer sit to/from stand: with supervision Goal: Pt Will Transfer Bed To Chair/Chair To Bed Outcome: Progressing Flowsheets (Taken 02/17/2024 1218) Pt will Transfer Bed to Chair/Chair to Bed: with supervision Goal: Pt Will Ambulate Outcome: Progressing Flowsheets (Taken 02/17/2024 1218) Pt will Ambulate:  10 feet  with rolling walker  with contact guard assist   12:19 PM, 02/17/24 Rosaria Settler, PT, DPT [place] with [facility]

## 2024-02-17 NOTE — Evaluation (Signed)
 Physical Therapy Evaluation Patient Details Name: Maria Pace MRN: [id number] DOB: [birth year] Today's Date: 02/17/2024  History of Present Illness  Maria Pace  is a 83 y.o. female,    with medical history significant of bipolar 1 disorder, schizophrenia, essential hypertension, GERD, hyperlipidemia, history of irregular heartbeat, type 2 diabetes mellitus, PAF on apixaban , HFrEF (EF 30-35%), HTN, patient with hospitalization last July, due to acute anemia from GI bleed, for which she refused endoscopy/colonoscopy .  - Was sent from her group home as she was found blood with large amount of diarrhea, dark stools, poor historian due to schizophrenia, she is answering question appropriately but cannot give exact details, she had anemia in July, refused GI evaluation, unfortunately there is no medication list by the patient, by by reviewing dispense record it does appear she is not anymore on her metoprolol  or Eliquis .  - In ED she had fever 101.4, CTA GI bleed protocol with no evidence of active extravasation, but was significant for sterile coral colitis, patient had manual disimpaction by ED physician, no stool in color, was followed by gray loose stool, no melena, but she was Hemoccult positive, her globin was stable, creatinine at baseline, [facility] consulted to admit   Clinical Impression  Patient agreeable to PT. Patient's youngest sister is present during session and contributes to history taking as pt is somewhat confused throughout. Patient and sister report patient is a household ambulator with RW, although having issues for at least a few days with gait, and receives assist with ADL from staff. This date, patient requires total assist to donn socks in bed, moderate assist with bed mobility and min assist with transfers and very short steps at bedside with RW. Pt is generally weak, and very unsteady/shaky once on her feet. Pt reports increased fatigue at end of session, and demonstrates  some SOB although she denies it. Pt tolerates sitting in chair at end of session, chair alarm set, call button in reach, and family and nursing staff present. Patient will benefit from continued skilled physical therapy acutely and in recommended venue in order to address current deficits in order to improve overall function and mobility.        If plan is discharge home, recommend the following: A lot of help with walking and/or transfers;A lot of help with bathing/dressing/bathroom;Assist for transportation;Supervision due to cognitive status;Help with stairs or ramp for entrance;Assistance with cooking/housework   Can travel by private vehicle        Equipment Recommendations None recommended by PT  Recommendations for Other Services       Functional Status Assessment Patient has had a recent decline in their functional status and demonstrates the ability to make significant improvements in function in a reasonable and predictable amount of time.     Precautions / Restrictions Precautions Precautions: Fall Recall of Precautions/Restrictions: Impaired Restrictions Weight Bearing Restrictions Per Provider Order: No      Mobility  Bed Mobility Overal bed mobility: Needs Assistance Bed Mobility: Supine to Sit     Supine to sit: Mod assist     General bed mobility comments: Pt able to initiate LE to EOB with verbal cueing but required mod assist to get BLE completely to EOB and with trunk elevation from flattened HOB bed, pt with slow labored movement    Transfers Overall transfer level: Needs assistance Equipment used: Rolling walker (2 wheels) Transfers: Sit to/from Stand, Bed to chair/wheelchair/BSC Sit to Stand: Mod assist, Min assist   Step pivot transfers:  Min assist, Contact guard assist       General transfer comment: 2 STS from bed this date with verbal cueing and min assist due to LE weakness, Pt demo increased shakiness throughout when standing, UE>LE, and  uses very small unsteady steps to get to chair with min/CGA    Ambulation/Gait Ambulation/Gait assistance: Min assist, Contact guard assist Gait Distance (Feet): 3 Feet Assistive device: Rolling walker (2 wheels) Gait Pattern/deviations: Step-to pattern, Decreased step length - right, Decreased step length - left, Trunk flexed, Knees buckling Gait velocity: Dec     General Gait Details: Pt limited to a few side steps at bedside with RW and min/CGA, pt is very unsteady on feet and demo inc shakiness when OOB, mild buckling of knees bilaterally, verbal cues needed to inc hip extension and pt tends to lean forward onto walker throughout  Stairs            Wheelchair Mobility     Tilt Bed    Modified Rankin (Stroke Patients Only)       Balance Overall balance assessment: Needs assistance Sitting-balance support: Feet supported, Bilateral upper extremity supported Sitting balance-Leahy Scale: Fair Sitting balance - Comments: seated EOB   Standing balance support: During functional activity, Reliant on assistive device for balance, Bilateral upper extremity supported Standing balance-Leahy Scale: Poor Standing balance comment: W RW           Pertinent Vitals/Pain Pain Assessment Pain Assessment: No/denies pain    Home Living Family/patient expects to be discharged to:: Group home Living Arrangements: Group Home Available Help at Discharge: Available 24 hours/day Type of Home: Group Home           Home Equipment: Rolling Walker (2 wheels);Rollator (4 wheels) Additional Comments: pt and pt sister confirms pt is a long term resident of a group home (since 2018)    Prior Function Prior Level of Function : Needs assist       Physical Assist : ADLs (physical);Mobility (physical) Mobility (physical): Transfers;Gait ADLs (physical): Bathing;IADLs;Dressing Mobility Comments: Pt and pt sister report pt usually ambulates with RW, pt reports she hasnt walked in at  least a few days ADLs Comments: Pt sister report staff at group home assist her with ADLs     Extremity/Trunk Assessment   Upper Extremity Assessment Upper Extremity Assessment: Defer to OT evaluation;Generalized weakness (pt demo decreased shoulder flexion bilaterally, ~120 deg, shoudler flexion MMT 4-/5.)    Lower Extremity Assessment Lower Extremity Assessment: Generalized weakness (LE strength grossly 4-/5 for AROM against gravity)    Cervical / Trunk Assessment Cervical / Trunk Assessment: Kyphotic  Communication   Communication Communication: No apparent difficulties Factors Affecting Communication: Reduced clarity of speech    Cognition Arousal: Alert Behavior During Therapy: WFL for tasks assessed/performed     Following commands: Intact       Cueing Cueing Techniques: Verbal cues, Tactile cues, Visual cues     General Comments      Exercises     Assessment/Plan    PT Assessment Patient needs continued PT services;All further PT needs can be met in the next venue of care  PT Problem List Decreased strength;Decreased range of motion;Decreased activity tolerance;Decreased balance;Decreased mobility;Decreased cognition       PT Treatment Interventions DME instruction;Balance training;Gait training;Stair training;Functional mobility training;Therapeutic activities;Patient/family education;Therapeutic exercise    PT Goals (Current goals can be found in the Care Plan section)  Acute Rehab PT Goals Patient Stated Goal: Return to group home PT Goal Formulation: With  patient Time For Goal Achievement: 03/02/24 Potential to Achieve Goals: Good    Frequency Min 3X/week     Co-evaluation               AM-PAC PT 6 Clicks Mobility  Outcome Measure Help needed turning from your back to your side while in a flat bed without using bedrails?: A Little Help needed moving from lying on your back to sitting on the side of a flat bed without using bedrails?: A  Lot Help needed moving to and from a bed to a chair (including a wheelchair)?: A Little Help needed standing up from a chair using your arms (e.g., wheelchair or bedside chair)?: A Little Help needed to walk in hospital room?: A Lot Help needed climbing 3-5 steps with a railing? : A Lot 6 Click Score: 15    End of Session Equipment Utilized During Treatment: Gait belt Activity Tolerance: Patient tolerated treatment well;Patient limited by fatigue Patient left: in chair;with call bell/phone within reach;with chair alarm set;with family/visitor present   PT Visit Diagnosis: Unsteadiness on feet (R26.81);Other abnormalities of gait and mobility (R26.89);Muscle weakness (generalized) (M62.81);Difficulty in walking, not elsewhere classified (R26.2)    Time: 1010-1032 PT Time Calculation (min) (ACUTE ONLY): 22 min   Charges:   PT Evaluation $PT Eval Moderate Complexity: 1 Mod   PT General Charges $$ ACUTE PT VISIT: 1 Visit         12:17 PM, 02/17/24 Mattelyn Imhoff Powell-Butler, PT, DPT [place] with [facility]

## 2024-02-17 NOTE — Consult Note (Signed)
 Consulting  Provider: Dr. Sherlon Primary Care Physician:  Gammon, Chrystal, NP Primary Gastroenterologist:  Dr. Cindie  Reason for Consultation: Stercoral colitis  HPI:  Maria Pace is a 83 y.o. female with a past medical history of  bipolar disorder, schizophrenia, GERD, dyslipidemia, paroxysmal atrial fibrillation (no longer on apixaban ), HFrEF (EF 30-35%), HTN, who presented to [facility], ER after being found covered in large volume of black stool.  Patient is a difficult historian given her underlying psychiatric disorder. Patient currently resides at [facility] facility.   Previous admission to our facility July 2025 for acute on chronic anemia.  Refused EGD and colonoscopy during that hospitalization.  Apixaban  has since been held.  In the ER, she was found to be septic with fever of 101.4, WBC 12.3, lactic acid 3.1.  Underwent CT angiogram abdomen pelvis which I personally reviewed which showed no active GI bleeding, large stool burden in the rectosigmoid colon with wall thickening suggestive of stercoral colitis.  Underwent manual disimpaction by the ER physician, no gross melena or hematochezia, stool was brown in color followed by great loose stool.  Hemoccult positive.  Hemoglobin 10.8 which is actually improved from prior admission when she was discharged 7.9.  Chronically takes oral iron .  She has been started on IV antibiotics as well as MiraLAX , Senokot, lactulose .  She denies any abdominal pain for me today.  Currently tolerating clear liquid diet.  She wishes to eat food.   Past Medical History:  Diagnosis Date   Bipolar 1 disorder (HCC)    Essential hypertension    GERD (gastroesophageal reflux disease)    Hyperlipidemia    Irregular heart beat    Schizophrenia (HCC)    Type 2 diabetes mellitus (HCC)     Past Surgical History:  Procedure Laterality Date   YAG LASER APPLICATION Left 01/06/2015   Procedure: YAG LASER APPLICATION;  Surgeon: Oneil Platts, MD;  Location: [facility];  Service: Ophthalmology;  Laterality: Left;    Prior to Admission medications   Medication Sig Start Date End Date Taking? Authorizing Provider  amiodarone  (PACERONE ) 200 MG tablet Take 1 tablet (200 mg total) by mouth 2 (two) times daily for 21 days, THEN 1 tablet (200 mg total) daily. Patient taking differently: Take 1 tablet (200 mg total) daily. 11/02/22 11/23/23  Mallipeddi, Vishnu P, MD  ammonium lactate (LAC-HYDRIN) 12 % lotion Apply 1 Application topically as needed for dry skin.    [provider]  ascorbic acid  (VITAMIN C ) 500 MG tablet Take 500 mg by mouth daily.    [provider]  aspirin  EC 81 MG tablet Take 1 tablet (81 mg total) by mouth daily. Swallow whole. 10/26/23 10/25/24  Maree, Pratik D, DO  atorvastatin  (LIPITOR) 20 MG tablet Take 20 mg by mouth daily. 10/05/20   [provider]  Camphor-Eucalyptus-Menthol ([employer] MEDICATED CHEST RUB EX) Apply topically.    [provider]  cholecalciferol  (VITAMIN D3) 10 MCG (400 UNIT) TABS tablet Take 1,000 Units by mouth daily.    [provider]  divalproex  (DEPAKOTE  ER) 250 MG 24 hr tablet Take 1 tablet (250 mg total) by mouth daily. 03/03/23 10/23/23  Vickey Mettle, MD  docusate sodium  (COLACE) 100 MG capsule Take 200 mg by mouth daily.    [provider]  ELIQUIS  5 MG TABS tablet Take 5 mg by mouth 2 (two) times daily. 10/30/23   [provider]  FEROSUL 325 (65 Fe) MG tablet Take 325 mg by mouth 2 (  two) times daily with a meal. 10/05/20   [provider]  gabapentin  (NEURONTIN ) 400 MG capsule Take 400 mg by mouth at bedtime.    [provider]  latanoprost  (XALATAN ) 0.005 % ophthalmic solution Place 1 drop into both eyes at bedtime.    [provider]  levothyroxine  (SYNTHROID ) 50 MCG tablet Take 50 mcg by mouth daily. 02/05/24   [provider]  loratadine  (CLARITIN ) 10 MG tablet Take 10 mg by mouth daily.     [provider]  metFORMIN (GLUCOPHAGE) 500 MG tablet Take 500 mg by mouth 2 (two) times daily with a meal.    [provider]  metoprolol  tartrate (LOPRESSOR ) 25 MG tablet Take 25 mg by mouth 2 (two) times daily. 10/30/23   [provider]  Multiple Vitamin (MULTIVITAMIN) tablet Take 1 tablet by mouth daily.    [provider]  OLANZapine  (ZYPREXA ) 20 MG tablet Take 1 tablet (20 mg total) by mouth at bedtime. Total of 25 mg at night. Take along with 20 mg tab Patient taking differently: Take 20 mg by mouth at bedtime. Total of 25 mg at night. Take along with 5 mg tab 04/05/23 10/23/23  Vickey Mettle, MD  OLANZapine  (ZYPREXA ) 5 MG tablet Take 1 tablet (5 mg total) by mouth at bedtime. Total of 25 mg at night. Take along with 20 mg tab 04/05/23 10/23/23  Vickey Mettle, MD  pantoprazole  (PROTONIX ) 40 MG tablet Take 40 mg by mouth daily.    [provider]  polyethylene glycol powder (GLYCOLAX /MIRALAX ) powder Take 17 g by mouth daily as needed for mild constipation or moderate constipation. Patient not taking: Reported on 10/23/2023    [provider]  sacubitril -valsartan  (ENTRESTO ) 24-26 MG Take 1 tablet by mouth 2 (two) times daily. 11/10/22   Mallipeddi, Vishnu P, MD  senna-docusate (SENOKOT-S) 8.6-50 MG tablet Take 2 tablets by mouth at bedtime. 02/23/17   Antoinette Doe, MD  spironolactone  (ALDACTONE ) 25 MG tablet Take 0.5 tablets (12.5 mg total) by mouth daily. 01/20/23 10/23/23  Mallipeddi, Vishnu P, MD  vitamin B-12 (CYANOCOBALAMIN ) 500 MCG tablet Take 1 tablet (500 mcg total) by mouth daily. 10/10/19   Evonnie Lenis, MD  zinc  sulfate 220 (50 Zn) MG capsule Take 220 mg by mouth daily.    [provider]    Current Facility-Administered Medications  Medication Dose Route Frequency Provider Last Rate Last Admin   acetaminophen  (TYLENOL ) tablet 650 mg  650 mg Oral Q6H PRN Elgergawy, Dawood S, MD       Or   acetaminophen  (TYLENOL ) suppository  650 mg  650 mg Rectal Q6H PRN Elgergawy, Dawood S, MD       albuterol  (PROVENTIL ) (2.5 MG/3ML) 0.083% nebulizer solution 2.5 mg  2.5 mg Nebulization Q2H PRN Elgergawy, Dawood S, MD       divalproex  (DEPAKOTE  ER) 24 hr tablet 250 mg  250 mg Oral Daily Elgergawy, Dawood S, MD   250 mg at 02/17/24 1004   hydrALAZINE  (APRESOLINE ) injection 5 mg  5 mg Intravenous Q4H PRN Elgergawy, Dawood S, MD       hydrALAZINE  (APRESOLINE ) injection 5 mg  5 mg Intravenous Q4H PRN Elgergawy, Dawood S, MD       HYDROcodone -acetaminophen  (NORCO/VICODIN) 5-325 MG per tablet 1-2 tablet  1-2 tablet Oral Q4H PRN Elgergawy, Dawood S, MD   2 tablet at 02/16/24 2146   insulin  aspart (novoLOG ) injection 0-9 Units  0-9 Units Subcutaneous TID AC & HS Jesus America, NP   2  Units at 02/17/24 1158   lactulose  (CHRONULAC ) 10 GM/15ML solution 20 g  20 g Oral TID Elgergawy, Dawood S, MD   20 g at 02/17/24 1006   latanoprost  (XALATAN ) 0.005 % ophthalmic solution 1 drop  1 drop Both Eyes QHS Elgergawy, Brayton RAMAN, MD   1 drop at 02/16/24 2149   levothyroxine  (SYNTHROID ) tablet 50 mcg  50 mcg Oral Q0600 Elgergawy, Dawood S, MD   50 mcg at 02/17/24 0520   liver oil-zinc  oxide (DESITIN) 40 % ointment   Topical Q0600 Maree Bracken D, DO   Given at 02/17/24 1206   OLANZapine  (ZYPREXA ) tablet 20 mg  20 mg Oral QHS Elgergawy, Dawood S, MD   20 mg at 02/16/24 2148   OLANZapine  (ZYPREXA ) tablet 5 mg  5 mg Oral QHS Elgergawy, Dawood S, MD   5 mg at 02/16/24 2149   ondansetron  (ZOFRAN ) tablet 4 mg  4 mg Oral Q6H PRN Elgergawy, Dawood S, MD       Or   ondansetron  (ZOFRAN ) injection 4 mg  4 mg Intravenous Q6H PRN Elgergawy, Dawood S, MD       pantoprazole  (PROTONIX ) EC tablet 40 mg  40 mg Oral BID Elgergawy, Dawood S, MD   40 mg at 02/17/24 1003   piperacillin -tazobactam (ZOSYN ) IVPB 3.375 g  3.375 g Intravenous Q8H Hunt, Madison H, RPH 12.5 mL/hr at 02/17/24 1204 3.375 g at 02/17/24 1204   polyethylene glycol (MIRALAX  / GLYCOLAX ) packet 17 g  17 g  Oral BID Elgergawy, Dawood S, MD   17 g at 02/17/24 1001   senna-docusate (Senokot-S) tablet 1 tablet  1 tablet Oral BID Elgergawy, Dawood S, MD   1 tablet at 02/17/24 1003    Allergies as of 02/16/2024 - Review Complete 02/16/2024  Allergen Reaction Noted   Haloperidol lactate Other (See Comments) 04/26/2016    Family History  Problem Relation Age of Onset   Hypertension Father    Diabetes type II Father     Social History   Socioeconomic History   Marital status: Single    Spouse name: Not on file   Number of children: Not on file   Years of education: Not on file   Highest education level: Not on file  Occupational History   Not on file  Tobacco Use   Smoking status: Never    Passive exposure: Never   Smokeless tobacco: Never  Vaping Use   Vaping status: Never Used  Substance and Sexual Activity   Alcohol  use: No   Drug use: No   Sexual activity: Never    Birth control/protection: Abstinence  Other Topics Concern   Not on file  Social History Narrative   Pt states that she is not married, nor in a relationship of any kind. Pt states that she lives at the group home and has help at the group home to make sure I get my medications and stuff when I need them. Pt denies any drug or alcohol  use. Denies smoking. States that she usually ambulates with a walker at the group home and is able to walk up and down the [place] to get my medicine but admits to having multiple falls as of recently. Pt states she has not walked in days now.   Social Drivers of Corporate Investment Banker Strain: Not on file  Food Insecurity: No Food Insecurity (02/16/2024)   Hunger Vital Sign    Worried About Running Out of Food in the Last Year: Never true  Ran Out of Food in the Last Year: Never true  Transportation Needs: No Transportation Needs (02/16/2024)   PRAPARE - Administrator, Civil Service (Medical): No    Lack of Transportation (Non-Medical): No  Recent Concern:  Transportation Needs - Unmet Transportation Needs (02/16/2024)   PRAPARE - Administrator, Civil Service (Medical): Yes    Lack of Transportation (Non-Medical): Yes  Physical Activity: Not on file  Stress: Not on file  Social Connections: Unknown (02/16/2024)   Social Connection and Isolation Panel    Frequency of Communication with Friends and Family: More than three times a week    Frequency of Social Gatherings with Friends and Family: Never    Attends Religious Services: Patient declined    Database Administrator or Organizations: Patient declined    Attends Banker Meetings: Patient declined    Marital Status: Never married  Intimate Partner Violence: Not At Risk (02/16/2024)   Humiliation, Afraid, Rape, and Kick questionnaire    Fear of Current or Ex-Partner: No    Emotionally Abused: No    Physically Abused: No    Sexually Abused: No    Review of Systems: General: Negative for anorexia, weight loss, fever, chills, fatigue, weakness. Eyes: Negative for vision changes.  ENT: Negative for hoarseness, difficulty swallowing , nasal congestion. CV: Negative for chest pain, angina, palpitations, dyspnea on exertion, peripheral edema.  Respiratory: Negative for dyspnea at rest, dyspnea on exertion, cough, sputum, wheezing.  GI: See history of present illness. GU:  Negative for dysuria, hematuria, urinary incontinence, urinary frequency, nocturnal urination.  MS: Negative for joint pain, low back pain.  Derm: Negative for rash or itching.  Neuro: Negative for weakness, abnormal sensation, seizure, frequent headaches, memory loss, confusion.  Psych: Negative for anxiety, depression Endo: Negative for unusual weight change.  Heme: Negative for bruising or bleeding. Allergy: Negative for rash or hives.  Physical Exam: Vital signs in last 24 hours: Temp:  [97.5 F (36.4 C)-101.4 F (38.6 C)] 98.3 F (36.8 C) (11/22 0500) Pulse Rate:  [80-95] 80 (11/22  0500) Resp:  [9-26] 18 (11/22 0500) BP: (87-138)/(54-93) 122/84 (11/22 0500) SpO2:  [93 %-98 %] 96 % (11/22 0500) Weight:  [97.3 kg] 97.3 kg (11/21 2112) Last BM Date : 02/17/24 General:   Alert,  Well-developed, well-nourished, pleasant and cooperative in NAD Head:  Normocephalic and atraumatic. Eyes:  Sclera clear, no icterus.   Conjunctiva pink. Ears:  Normal auditory acuity. Nose:  No deformity, discharge,  or lesions. Mouth:  No deformity or lesions, dentition normal. Neck:  Supple; no masses or thyromegaly. Lungs:  Clear throughout to auscultation.   No wheezes, crackles, or rhonchi. No acute distress. Heart:  Regular rate and rhythm; no murmurs, clicks, rubs,  or gallops. Abdomen:  Soft, nontender and nondistended. No masses, hepatosplenomegaly or hernias noted. Normal bowel sounds, without guarding, and without rebound.   Msk:  Symmetrical without gross deformities. Normal posture. Pulses:  Normal pulses noted. Extremities:  Without clubbing or edema. Neurologic:  Alert and  oriented x4;  grossly normal neurologically. Skin:  Intact without significant lesions or rashes. Cervical Nodes:  No significant cervical adenopathy. Psych:  Alert and cooperative. Normal mood and affect.  Intake/Output from previous day: 11/21 0701 - 11/22 0700 In: 1080 [P.O.:480; IV Piggyback:600] Out: 1400 [Urine:1400] Intake/Output this shift: No intake/output data recorded.  Lab Results: Recent Labs    02/16/24 1610 02/16/24 2025 02/17/24 0445  WBC 12.3* 11.5* 10.9*  HGB  12.1 10.8* 10.3*  HCT 38.0 33.7* 32.1*  PLT 219 202 187   BMET Recent Labs    02/16/24 1610 02/17/24 0445  NA 140 137  K 4.3 4.3  CL 101 102  CO2 23 22  GLUCOSE 119* 76  BUN 34* 35*  CREATININE 1.66* 1.68*  CALCIUM  8.8* 8.3*   LFT Recent Labs    02/16/24 1610  PROT 7.0  ALBUMIN 3.6  AST 49*  ALT 33  ALKPHOS 88  BILITOT 0.4   PT/INR Recent Labs    02/16/24 1610  LABPROT 14.1  INR 1.0    Hepatitis Panel No results for input(s): HEPBSAG, HCVAB, HEPAIGM, HEPBIGM in the last 72 hours. C-Diff No results for input(s): CDIFFTOX in the last 72 hours.  Studies/Results: CT ANGIO GI BLEED Result Date: 02/16/2024 EXAM: CTA ABDOMEN AND PELVIS WITH CONTRAST 02/16/2024 07:33:33 PM TECHNIQUE: CTA images of the abdomen and pelvis with intravenous contrast. Three-dimensional MIP/volume rendered formations were performed. Automated exposure control, iterative reconstruction, and/or weight based adjustment of the mA/kV was utilized to reduce the radiation dose to as low as reasonably achievable. 75 mL of iohexol  (OMNIPAQUE ) 350 MG/ML injection was administered. COMPARISON: 02/20/2017 CLINICAL HISTORY: fever. melena FINDINGS: VASCULATURE: GI BLEED: No active contrast extravasation to localize GI bleed. AORTA: Aortic atherosclerosis. No acute finding. No abdominal aortic aneurysm. No dissection. CELIAC TRUNK: No acute finding. No occlusion or significant stenosis. SUPERIOR MESENTERIC ARTERY: No acute finding. No occlusion or significant stenosis. INFERIOR MESENTERIC ARTERY: No acute finding. No occlusion or significant stenosis. RENAL ARTERIES: No acute finding. No occlusion or significant stenosis. ILIAC ARTERIES: No acute finding. No occlusion or significant stenosis. ABDOMEN/PELVIS: LOWER CHEST: Ground glass opacities in the right lower lobe could reflect atelectasis or infiltrates. Mild cardiomegaly. LIVER: The liver is unremarkable. GALLBLADDER AND BILE DUCTS: Layering high-density material within the gallbladder, presumably small stones or sludge. No biliary ductal dilatation. SPLEEN: The spleen is unremarkable. PANCREAS: The pancreas is unremarkable. ADRENAL GLANDS: Bilateral adrenal glands demonstrate no acute abnormality. KIDNEYS, URETERS AND BLADDER: Foley catheter in the bladder, which is decompressed. No stones in the kidneys or ureters. No hydronephrosis. No perinephric or periureteral  stranding. GI AND BOWEL: Large stool burden throughout the colon. Large stool burden in the rectosigmoid colon with wall thickening suggesting stercoral colitis. Stomach and duodenal sweep demonstrate no acute abnormality. There is no bowel obstruction. No abnormal bowel wall distension. REPRODUCTIVE: Reproductive organs are unremarkable. PERITONEUM AND RETROPERITONEUM: No ascites or free air. LYMPH NODES: No lymphadenopathy. BONES AND SOFT TISSUES: No acute abnormality of the bones. No acute soft tissue abnormality. IMPRESSION: 1. No active GI bleeding. 2. Large stool burden in the rectosigmoid colon with wall thickening, suggestive of stercoral colitis. 3. Right lower lobe ground-glass opacities, which may represent atelectasis or infiltrate. 4. Mild cardiomegaly. 5. Cholelithiasis Electronically signed by: Franky Crease MD 02/16/2024 07:39 PM EST RP Workstation: [id number]   DG Chest Portable 1 View Result Date: 02/16/2024 CLINICAL DATA:  Altered mental status.  Gastrointestinal bleeding. EXAM: PORTABLE CHEST 1 VIEW COMPARISON:  10/20/2023. FINDINGS: Very poor depth of inspiration. Stable enlarged cardiac silhouette. Clear lungs with normal vascularity. Diffuse osteopenia. Moderate right glenohumeral degenerative changes. IMPRESSION: 1. No acute abnormality. 2. Stable cardiomegaly. Electronically Signed   By: Elspeth Bathe M.D.   On: 02/16/2024 15:07    Assessment/Plan:  1.  Stercoral colitis-manual disimpaction in the ER.  Continue on IV antibiotics.  Continue aggressive bowel regimen.  Advance diet as tolerated.  2.  Heme positive stool, acute  on chronic anemia-hemoglobin stable.  Continue to monitor.  No longer taking apixaban .  Patient previously refused EGD and colonoscopy during prior admission.  She has same sentiment with me this morning.  Otherwise supportive care.  Carlin POUR. Cindie, D.O. Gastroenterology and Hepatology [facility]    LOS: 1 day      02/17/2024, 12:42 PM

## 2024-02-17 NOTE — TOC Initial Note (Signed)
 Transition of Care [facility]) - Initial/Assessment Note    Patient Details  Name: Maria Pace MRN: [id number] Date of Birth: 1940/08/10  [facility] CM/SW Contact:    Lucie Lunger, LCSWA Phone Number: 02/17/2024, 12:15 PM  Clinical Narrative:                 CSW notes per chart review that pt arrived from a family care home. CSW notes pt to be high risk for readmission. CSW spoke to [place] (owner of group home) to complete assessment. Pt has limited assistance with completing her ADLs. Pt has a walker to use when needed in the facility. Randie states pt has been refusing to get out of bed and to be brought to hospital. Due to this an emergency protective order was filed by [facility] DSS, Randie states this is how they got pt to hospital. Case worker is Dama Gull and pt has a court hearing for guardianship on the 25th. TOC to follow.   Expected Discharge Plan: Group Home Barriers to Discharge: Continued Medical Work up   Patient Goals and CMS Choice Patient states their goals for this hospitalization and ongoing recovery are:: get stronger CMS Medicare.gov Compare Post Acute Care list provided to:: Patient Represenative (must comment)        Expected Discharge Plan and Services In-house Referral: Clinical Social Work Discharge Planning Services: CM Consult Post Acute Care Choice: Home Health Living arrangements for the past 2 months: Group Home                                      Prior Living Arrangements/Services Living arrangements for the past 2 months: Group Home Lives with:: Facility Resident Patient language and need for interpreter reviewed:: Yes Do you feel safe going back to the place where you live?: Yes      Need for Family Participation in Patient Care: Yes (Comment) Care giver support system in place?: Yes (comment) Current home services: DME Criminal Activity/Legal Involvement Pertinent to Current Situation/Hospitalization: No  - Comment as needed  Activities of Daily Living   ADL Screening (condition at time of admission) Independently performs ADLs?: No Does the patient have a NEW difficulty with bathing/dressing/toileting/self-feeding that is expected to last >3 days?: Yes (Initiates electronic notice to provider for possible OT consult) Does the patient have a NEW difficulty with getting in/out of bed, walking, or climbing stairs that is expected to last >3 days?: Yes (Initiates electronic notice to provider for possible PT consult) Does the patient have a NEW difficulty with communication that is expected to last >3 days?: No Is the patient deaf or have difficulty hearing?: Yes Does the patient have difficulty seeing, even when wearing glasses/contacts?: No Does the patient have difficulty concentrating, remembering, or making decisions?: Yes  Permission Sought/Granted                  Emotional Assessment Appearance:: Appears stated age       Alcohol  / Substance Use: Not Applicable Psych Involvement: No (comment)  Admission diagnosis:  Gastrointestinal hemorrhage, unspecified gastrointestinal hemorrhage type [K92.2] Stercoral colitis [K52.89] Patient Active Problem List   Diagnosis Date Noted   Stercoral colitis 02/16/2024   Symptomatic anemia 10/21/2023   Melena 10/21/2023   Chronic anticoagulation 10/21/2023   Acute blood loss anemia 10/20/2023   Chronic HFrEF (heart failure with reduced ejection fraction) (HCC) 10/20/2023   Current  use of long term anticoagulation 09/01/2023   PAF (paroxysmal atrial fibrillation) (HCC) 09/01/2023   Sepsis due to COVID-19 (HCC) 07/12/2022   Chronic combined systolic and diastolic congestive heart failure (HCC)    ARF (acute renal failure) 10/04/2019   Elevated CK 10/03/2019   SVT (supraventricular tachycardia) 10/03/2019   GERD (gastroesophageal reflux disease)    Schizophrenia (HCC) 11/15/2017   Hypotension 02/20/2017   Severe dehydration 02/20/2017    AKI (acute kidney injury) 02/20/2017   Constipation 02/20/2017   Lactic acidosis 02/20/2017   Urinary retention 11/10/2016   Hypertension 11/10/2016   Type 2 diabetes mellitus (HCC) 11/10/2016   Hyperlipidemia 11/10/2016   Hyponatremia 11/10/2016   SBO (small bowel obstruction) (HCC) 11/03/2015   PCP:  Gammon, Chrystal, NP Pharmacy:   [facility], [place] - [address] [place] [address] Phone: [phone] Fax: [phone]     Social Drivers of Health (SDOH) Social History: SDOH Screenings   Food Insecurity: No Food Insecurity (02/16/2024)  Housing: Low Risk  (02/16/2024)  Transportation Needs: No Transportation Needs (02/16/2024)  Recent Concern: Transportation Needs - Unmet Transportation Needs (02/16/2024)  Utilities: Not At Risk (02/16/2024)  Depression (PHQ2-9): High Risk (09/06/2021)  Social Connections: Unknown (02/16/2024)  Tobacco Use: Low Risk  (02/17/2024)   SDOH Interventions:     Readmission Risk Interventions    02/17/2024   12:13 PM 07/14/2022   11:16 AM  Readmission Risk Prevention Plan  Transportation Screening Complete Complete  PCP or Specialist Appt within 5-7 Days  Not Complete  Home Care Screening  Complete  Medication Review (RN CM)  Complete  HRI or Home Care Consult Complete   Social Work Consult for Recovery Care Planning/Counseling Complete   Palliative Care Screening Not Applicable   Medication Review Oceanographer) Complete

## 2024-02-18 DIAGNOSIS — K5289 Other specified noninfective gastroenteritis and colitis: Secondary | ICD-10-CM | POA: Diagnosis not present

## 2024-02-18 LAB — BASIC METABOLIC PANEL WITH GFR
Anion gap: 14 (ref 5–15)
BUN: 41 mg/dL — ABNORMAL HIGH (ref 8–23)
CO2: 21 mmol/L — ABNORMAL LOW (ref 22–32)
Calcium: 8.1 mg/dL — ABNORMAL LOW (ref 8.9–10.3)
Chloride: 96 mmol/L — ABNORMAL LOW (ref 98–111)
Creatinine, Ser: 2.55 mg/dL — ABNORMAL HIGH (ref 0.44–1.00)
GFR, Estimated: 18 mL/min — ABNORMAL LOW (ref >60)
Glucose, Bld: 116 mg/dL — ABNORMAL HIGH (ref 70–99)
Potassium: 4.4 mmol/L (ref 3.5–5.1)
Sodium: 131 mmol/L — ABNORMAL LOW (ref 135–145)

## 2024-02-18 LAB — LACTIC ACID, PLASMA
Lactic Acid, Venous: 1.6 mmol/L (ref 0.5–1.9)
Lactic Acid, Venous: 1.8 mmol/L (ref 0.5–1.9)

## 2024-02-18 LAB — CBC
HCT: 29.7 % — ABNORMAL LOW (ref 36.0–46.0)
Hemoglobin: 9.5 g/dL — ABNORMAL LOW (ref 12.0–15.0)
MCH: 29.8 pg (ref 26.0–34.0)
MCHC: 32 g/dL (ref 30.0–36.0)
MCV: 93.1 fL (ref 80.0–100.0)
Platelets: 183 K/uL (ref 150–400)
RBC: 3.19 MIL/uL — ABNORMAL LOW (ref 3.87–5.11)
RDW: 15.8 % — ABNORMAL HIGH (ref 11.5–15.5)
WBC: 10.3 K/uL (ref 4.0–10.5)
nRBC: 0 % (ref 0.0–0.2)

## 2024-02-18 LAB — MRSA NEXT GEN BY PCR, NASAL: MRSA by PCR Next Gen: NOT DETECTED

## 2024-02-18 LAB — GLUCOSE, CAPILLARY
Glucose-Capillary: 131 mg/dL — ABNORMAL HIGH (ref 70–99)
Glucose-Capillary: 238 mg/dL — ABNORMAL HIGH (ref 70–99)
Glucose-Capillary: 229 mg/dL — ABNORMAL HIGH (ref 70–99)
Glucose-Capillary: 157 mg/dL — ABNORMAL HIGH (ref 70–99)

## 2024-02-18 LAB — MAGNESIUM: Magnesium: 1.7 mg/dL (ref 1.7–2.4)

## 2024-02-18 MED ORDER — SODIUM CHLORIDE 0.9 % IV SOLN: INTRAVENOUS | Status: AC

## 2024-02-18 MED ORDER — SODIUM CHLORIDE 0.9 % IV BOLUS: 250.0000 mL | Freq: Once | INTRAVENOUS | Status: DC

## 2024-02-18 MED ORDER — MIDODRINE HCL 5 MG PO TABS
10.0000 mg | ORAL_TABLET | Freq: Once | ORAL | Status: AC
  Administered 2024-02-18: 10 mg via ORAL
  Filled 2024-02-18: qty 2

## 2024-02-18 MED ORDER — ORAL CARE MOUTH RINSE: 15.0000 mL | OROMUCOSAL | Status: DC | PRN

## 2024-02-18 MED ORDER — SODIUM CHLORIDE 0.9 % IV SOLN: INTRAVENOUS | Status: DC

## 2024-02-18 MED ORDER — SODIUM CHLORIDE 0.9 % IV BOLUS
150.0000 mL | Freq: Once | INTRAVENOUS | Status: AC
  Administered 2024-02-18: 150 mL via INTRAVENOUS

## 2024-02-18 MED ORDER — PIPERACILLIN-TAZOBACTAM 3.375 G IVPB
3.3750 g | Freq: Two times a day (BID) | INTRAVENOUS | Status: DC
  Administered 2024-02-18 – 2024-02-19 (×3): 3.375 g via INTRAVENOUS
  Filled 2024-02-18 (×4): qty 50

## 2024-02-18 MED ORDER — SODIUM CHLORIDE 0.9 % IV SOLN: INTRAVENOUS | Status: AC | PRN

## 2024-02-18 MED ORDER — SODIUM CHLORIDE 0.9 % IV BOLUS
250.0000 mL | Freq: Once | INTRAVENOUS | Status: AC
  Administered 2024-02-18: 250 mL via INTRAVENOUS

## 2024-02-18 MED ORDER — MIDODRINE HCL 5 MG PO TABS
5.0000 mg | ORAL_TABLET | Freq: Three times a day (TID) | ORAL | Status: DC
  Administered 2024-02-18 – 2024-02-19 (×2): 5 mg via ORAL
  Filled 2024-02-18 (×2): qty 1

## 2024-02-18 MED ORDER — SODIUM CHLORIDE 0.9 % IV BOLUS
500.0000 mL | Freq: Once | INTRAVENOUS | Status: AC
  Administered 2024-02-18: 500 mL via INTRAVENOUS

## 2024-02-18 MED ORDER — NOREPINEPHRINE 4 MG/250ML-% IV SOLN
0.0000 ug/min | INTRAVENOUS | Status: DC
  Administered 2024-02-18: 2 ug/min via INTRAVENOUS
  Administered 2024-02-19: 6 ug/min via INTRAVENOUS
  Filled 2024-02-18 (×3): qty 250

## 2024-02-18 NOTE — TOC Progression Note (Signed)
 Transition of Care [facility]) - Progression Note    Patient Details  Name: MELEANE SELINGER MRN: [id number] Date of Birth: 1940/09/22  Transition of Care [facility]) CM/SW Contact  Hoy DELENA Bigness, [name] Phone Number: 02/18/2024, 10:48 AM  Clinical Narrative:    Referrals have been faxed out for STR placement.  PASRR requested and currently pending.    Expected Discharge Plan: Group Home Barriers to Discharge: Continued Medical Work up               Expected Discharge Plan and Services In-house Referral: Clinical Social Work Discharge Planning Services: CM Consult Post Acute Care Choice: Home Health Living arrangements for the past 2 months: Group Home                                       Social Drivers of Health (SDOH) Interventions SDOH Screenings   Food Insecurity: No Food Insecurity (02/16/2024)  Housing: Low Risk  (02/16/2024)  Transportation Needs: No Transportation Needs (02/16/2024)  Recent Concern: Transportation Needs - Unmet Transportation Needs (02/16/2024)  Utilities: Not At Risk (02/16/2024)  Depression (PHQ2-9): High Risk (09/06/2021)  Social Connections: Unknown (02/16/2024)  Tobacco Use: Low Risk  (02/17/2024)    Readmission Risk Interventions    02/18/2024   10:48 AM 02/17/2024   12:13 PM 07/14/2022   11:16 AM  Readmission Risk Prevention Plan  Transportation Screening Complete Complete Complete  PCP or Specialist Appt within 5-7 Days   Not Complete  PCP or Specialist Appt within 3-5 Days Complete    Home Care Screening   Complete  Medication Review (RN CM)   Complete  HRI or Home Care Consult Complete Complete   Social Work Consult for Recovery Care Planning/Counseling Complete Complete   Palliative Care Screening Not Applicable Not Applicable   Medication Review Oceanographer) Complete Complete

## 2024-02-18 NOTE — Progress Notes (Signed)
 Patient transferred to stepdown ICU for hypotension. Currently receiving and 500ml bolus. Patient is alert and oriented to person, place, and time. No complaints currently. BP 99/55, MAP 71. HR 81 and SpO2 99% on 2LNC.

## 2024-02-18 NOTE — Progress Notes (Signed)
 Patient with low bp, notified provider, bolus administered and transferred to stepdown unit.

## 2024-02-18 NOTE — Progress Notes (Signed)
 PROGRESS NOTE    Maria Pace  FMW:[id number] DOB: 04-Jan-1941 DOA: 02/16/2024 PCP: Wilmon Penton, NP   Brief Narrative:    Maria Pace  is a 83 y.o. female,    with medical history significant of bipolar 1 disorder, schizophrenia, essential hypertension, GERD, hyperlipidemia, history of irregular heartbeat, type 2 diabetes mellitus, PAF on apixaban , HFrEF (EF 30-35%), HTN, patient with hospitalization last July, due to acute anemia from GI bleed, for which she refused endoscopy/colonoscopy . - Was sent from her group home as she was found blood with large amount of diarrhea, dark stools, poor historian due to schizophrenia, she is answering question appropriately but cannot give exact details, she had anemia in July, refused GI evaluation, unfortunately there is no medication list by the patient, by by reviewing dispense record it does appear she is not anymore on her metoprolol  or Eliquis .  Patient has been admitted for stercoral colitis with manual disimpaction performed in the ED.  She remains on IV Zosyn  and has been seen by GI with plans to advance diet as tolerated as well as aggressive bowel regimen.  She has previously refused any endoscopy and plans are for supportive care as well as potential SNF on discharge.  Transferred to stepdown unit 11/23 for closer blood pressure monitoring with low readings noted this a.m. likely from inaccurate blood pressure readings.  Started on IV fluid for AKI.  Assessment & Plan:   Principal Problem:   Stercoral colitis Active Problems:   Hypertension   Type 2 diabetes mellitus (HCC)   Severe dehydration   Schizophrenia (HCC)   GERD (gastroesophageal reflux disease)   PAF (paroxysmal atrial fibrillation) (HCC)   Chronic HFrEF (heart failure with reduced ejection fraction) (HCC)   Chronic constipation   Acute on chronic anemia  Assessment and Plan:  Sepsis, POA Sterocoral colitis Hemoccult positive stool -Sepsis POA, febrile 1.4, with  leukocytosis and tachypnea  -Occult positive stool, but hemoglobin is stable, had manual disimpaction in ED, impacted stool was brown in color, no melena or bright red blood per rectum, followed by diarrhea dark gray in color, no melena, Hemoccult positive stool most likely due to colitis, as well she is on iron  tablets. -Follow-up blood cultures with no growth at this time -Most likely in the setting of stercoral colitis -Continue with IV Zosyn . - Continue on laxative regimen - She is on aspirin , will hold - Appreciate GI recommendations to conservatively manage and follow CBC -CTA GI bleeding protocol is negative   Chronic HFrEF - 09/26/2023 echo EF 30-35%, normal RVF, moderate MR - Patient with lower extremity edema -Given she presents with sepsis we will hold on diuresis -Pressure is soft we will hold on Entresto  -Does appear her metoprolol  has been discontinued due to soft blood pressure in the past -Resume Aldactone  once stable, continue to hold for now - Continue on gentle IV fluid as noted below, time-limited   Paroxysmal atrial fibrillation - Currently in sinus rhythm - Continue with amiodarone  -By reviewing records it does appear she has not been on Eliquis  for few months currently, and I would assume this is most likely in the setting of her previous anemia due to GI bleed and refusing anemia workup as it does appear risks outweighed benefits anticoagulation   Schizophrenia, unspecified type - Continue olanzapine  - Continue Depakote    AKI on CKD stage IIIb with hyponatremia -Likely in the setting of some hypotension this a.m. as well as some potential dehydration with ongoing diarrhea -Start IV normal  saline -Avoid nephrotoxic agents -Strict I's and O's -Follow lab work   Diabetes mellitus type 2, controlled - A1c, hold metformin and keep on insulin  sliding scale - NovoLog  sliding scale - Holding metformin   Mixed hyperlipidemia - Continue statin  Morbid  obesity-class III BMI 41.89  DVT prophylaxis:SCDs Code Status: Full Family Communication: None at bedside Disposition Plan:  Status is: Inpatient Remains inpatient appropriate because: Need for IV medications.   Consultants:  GI  Procedures:  None  Antimicrobials:  Anti-infectives (From admission, onward)    Start     Dose/Rate Route Frequency Ordered Stop   02/17/24 0400  piperacillin -tazobactam (ZOSYN ) IVPB 3.375 g        3.375 g 12.5 mL/hr over 240 Minutes Intravenous Every 8 hours 02/16/24 2126     02/16/24 2000  piperacillin -tazobactam (ZOSYN ) IVPB 3.375 g        3.375 g 100 mL/hr over 30 Minutes Intravenous  Once 02/16/24 1949 02/17/24 1200       Subjective: Patient seen and evaluated today with no new acute complaints or concerns.  She was noted to have some soft blood pressure readings early this morning and was transferred to stepdown unit for closer monitoring and was also given a fluid bolus.  It appears that her blood pressure readings may have been somewhat inaccurate.  Additionally, she was noted to have ongoing diarrhea with 3 bowel movements overnight.  No further overt bleeding noted however.  Objective: Vitals:   02/18/24 0845 02/18/24 0900 02/18/24 1000 02/18/24 1001  BP: (!) 100/47 (!) 88/50  (!) 118/97  Pulse: 82 81    Resp: 11 15  17   Temp:      TempSrc:      SpO2: 100% 100% 100% 100%  Weight:      Height:        Intake/Output Summary (Last 24 hours) at 02/18/2024 1210 Last data filed at 02/18/2024 0600 Gross per 24 hour  Intake 1446.82 ml  Output 550 ml  Net 896.82 ml   Filed Weights   02/16/24 2112 02/18/24 0758  Weight: 97.3 kg 99.4 kg    Examination:  General exam: Appears calm and comfortable, obese Respiratory system: Clear to auscultation. Respiratory effort normal.  Nasal cannula oxygen Cardiovascular system: S1 & S2 heard, RRR.  Gastrointestinal system: Abdomen is soft Central nervous system: Alert and awake Extremities:  No edema Skin: No significant lesions noted Psychiatry: Flat affect.    Data Reviewed: I have personally reviewed following labs and imaging studies  CBC: Recent Labs  Lab 02/16/24 1610 02/16/24 2025 02/17/24 0445 02/18/24 0521  WBC 12.3* 11.5* 10.9* 10.3  NEUTROABS 9.5*  --   --   --   HGB 12.1 10.8* 10.3* 9.5*  HCT 38.0 33.7* 32.1* 29.7*  MCV 92.5 92.8 92.5 93.1  PLT 219 202 187 183   Basic Metabolic Panel: Recent Labs  Lab 02/16/24 1610 02/17/24 0445 02/18/24 0521  NA 140 137 131*  K 4.3 4.3 4.4  CL 101 102 96*  CO2 23 22 21*  GLUCOSE 119* 76 116*  BUN 34* 35* 41*  CREATININE 1.66* 1.68* 2.55*  CALCIUM  8.8* 8.3* 8.1*  MG  --   --  1.7   GFR: Estimated Creatinine Clearance: 18.3 mL/min (A) (by C-G formula based on SCr of 2.55 mg/dL (H)). Liver Function Tests: Recent Labs  Lab 02/16/24 1610  AST 49*  ALT 33  ALKPHOS 88  BILITOT 0.4  PROT 7.0  ALBUMIN 3.6   No  results for input(s): LIPASE, AMYLASE in the last 168 hours. No results for input(s): AMMONIA in the last 168 hours. Coagulation Profile: Recent Labs  Lab 02/16/24 1610  INR 1.0   Cardiac Enzymes: No results for input(s): CKTOTAL, CKMB, CKMBINDEX, TROPONINI in the last 168 hours. BNP (last 3 results) No results for input(s): PROBNP in the last 8760 hours. HbA1C: No results for input(s): HGBA1C in the last 72 hours. CBG: Recent Labs  Lab 02/17/24 1127 02/17/24 1620 02/17/24 2154 02/18/24 0804 02/18/24 1120  GLUCAP 156* 126* 148* 131* 238*   Lipid Profile: No results for input(s): CHOL, HDL, LDLCALC, TRIG, CHOLHDL, LDLDIRECT in the last 72 hours. Thyroid  Function Tests: No results for input(s): TSH, T4TOTAL, FREET4, T3FREE, THYROIDAB in the last 72 hours. Anemia Panel: No results for input(s): VITAMINB12, FOLATE, FERRITIN, TIBC, IRON , RETICCTPCT in the last 72 hours. Sepsis Labs: Recent Labs  Lab 02/16/24 2025 02/16/24 2327  02/18/24 0734 02/18/24 1022  LATICACIDVEN 3.1* 2.0* 1.6 1.8    Recent Results (from the past 240 hours)  Culture, blood (routine x 2)     Status: None (Preliminary result)   Collection Time: 02/16/24  8:25 PM   Specimen: BLOOD  Result Value Ref Range Status   Specimen Description BLOOD LEFT ANTECUBITAL  Final   Special Requests   Final    BOTTLES DRAWN AEROBIC ONLY Blood Culture adequate volume   Culture   Final    NO GROWTH 2 DAYS Performed at [facility], [address], [place], [place] [address]    Report Status PENDING  Incomplete  Culture, blood (routine x 2)     Status: None (Preliminary result)   Collection Time: 02/16/24  8:34 PM   Specimen: BLOOD  Result Value Ref Range Status   Specimen Description BLOOD RIGHT ANTECUBITAL  Final   Special Requests   Final    BOTTLES DRAWN AEROBIC AND ANAEROBIC Blood Culture adequate volume   Culture   Final    NO GROWTH 2 DAYS Performed at [facility], [address] [place], [place] [address]    Report Status PENDING  Incomplete         Radiology Studies: CT ANGIO GI BLEED Result Date: 02/16/2024 EXAM: CTA ABDOMEN AND PELVIS WITH CONTRAST 02/16/2024 07:33:33 PM TECHNIQUE: CTA images of the abdomen and pelvis with intravenous contrast. Three-dimensional MIP/volume rendered formations were performed. Automated exposure control, iterative reconstruction, and/or weight based adjustment of the mA/kV was utilized to reduce the radiation dose to as low as reasonably achievable. 75 mL of iohexol  (OMNIPAQUE ) 350 MG/ML injection was administered. COMPARISON: 02/20/2017 CLINICAL HISTORY: fever. melena FINDINGS: VASCULATURE: GI BLEED: No active contrast extravasation to localize GI bleed. AORTA: Aortic atherosclerosis. No acute finding. No abdominal aortic aneurysm. No dissection. CELIAC TRUNK: No acute finding. No occlusion or significant stenosis. SUPERIOR MESENTERIC ARTERY: No acute finding. No occlusion or significant stenosis. INFERIOR  MESENTERIC ARTERY: No acute finding. No occlusion or significant stenosis. RENAL ARTERIES: No acute finding. No occlusion or significant stenosis. ILIAC ARTERIES: No acute finding. No occlusion or significant stenosis. ABDOMEN/PELVIS: LOWER CHEST: Ground glass opacities in the right lower lobe could reflect atelectasis or infiltrates. Mild cardiomegaly. LIVER: The liver is unremarkable. GALLBLADDER AND BILE DUCTS: Layering high-density material within the gallbladder, presumably small stones or sludge. No biliary ductal dilatation. SPLEEN: The spleen is unremarkable. PANCREAS: The pancreas is unremarkable. ADRENAL GLANDS: Bilateral adrenal glands demonstrate no acute abnormality. KIDNEYS, URETERS AND BLADDER: Foley catheter in the bladder, which is decompressed. No stones in  the kidneys or ureters. No hydronephrosis. No perinephric or periureteral stranding. GI AND BOWEL: Large stool burden throughout the colon. Large stool burden in the rectosigmoid colon with wall thickening suggesting stercoral colitis. Stomach and duodenal sweep demonstrate no acute abnormality. There is no bowel obstruction. No abnormal bowel wall distension. REPRODUCTIVE: Reproductive organs are unremarkable. PERITONEUM AND RETROPERITONEUM: No ascites or free air. LYMPH NODES: No lymphadenopathy. BONES AND SOFT TISSUES: No acute abnormality of the bones. No acute soft tissue abnormality. IMPRESSION: 1. No active GI bleeding. 2. Large stool burden in the rectosigmoid colon with wall thickening, suggestive of stercoral colitis. 3. Right lower lobe ground-glass opacities, which may represent atelectasis or infiltrate. 4. Mild cardiomegaly. 5. Cholelithiasis Electronically signed by: Franky Crease MD 02/16/2024 07:39 PM EST RP Workstation: [id number]   DG Chest Portable 1 View Result Date: 02/16/2024 CLINICAL DATA:  Altered mental status.  Gastrointestinal bleeding. EXAM: PORTABLE CHEST 1 VIEW COMPARISON:  10/20/2023. FINDINGS: Very poor depth  of inspiration. Stable enlarged cardiac silhouette. Clear lungs with normal vascularity. Diffuse osteopenia. Moderate right glenohumeral degenerative changes. IMPRESSION: 1. No acute abnormality. 2. Stable cardiomegaly. Electronically Signed   By: Elspeth Bathe M.D.   On: 02/16/2024 15:07        Scheduled Meds:  Chlorhexidine  Gluconate Cloth  6 each Topical Daily   divalproex   250 mg Oral Daily   insulin  aspart  0-9 Units Subcutaneous TID AC & HS   latanoprost   1 drop Both Eyes QHS   levothyroxine   50 mcg Oral Q0600   liver oil-zinc  oxide   Topical Q0600   OLANZapine   20 mg Oral QHS   OLANZapine   5 mg Oral QHS   pantoprazole   40 mg Oral BID   polyethylene glycol  17 g Oral BID   senna-docusate  1 tablet Oral BID   Continuous Infusions:  sodium chloride      norepinephrine  (LEVOPHED ) Adult infusion     piperacillin -tazobactam (ZOSYN )  IV 3.375 g (02/18/24 1132)     LOS: 2 days    Time spent: 55 minutes    Deniro Laymon D Maree, DO [facility]  If 7PM-7AM, please contact night-coverage [email or web address] 02/18/2024, 12:10 PM

## 2024-02-18 NOTE — Plan of Care (Addendum)
 This patient remains on AP-ICCU as of time of writing. The patient awakes to voice and is oriented to person and place, but is unable to state current month, current year, or describe why she is in the hospital. The patient also seems to be experiencing auditory hallucinations (carrying active conversations while no one else is in the room). The patient is requiring 2 L / min of supplemental O2 via [place]. The patient is in SR with a wide QRS by my measurement. The patient is receiving infusions of Levophed  and MIVF via PIV. The patient's foley catheter remains in place for now. Overnight, this RN initiated skin care orders, falls precautions (including a yellow arm band, a bed alarm, and floor mats), an oral care protocol, and a consult to the HF navigation team. The patient is receiving AC/HS CBGs with SSI. SCDs initiated for the patient overnight by this RN as well.    Problem: Education: Goal: Knowledge of General Education information will improve Description: Including pain rating scale, medication(s)/side effects and non-pharmacologic comfort measures Outcome: Progressing   Problem: Health Behavior/Discharge Planning: Goal: Ability to manage health-related needs will improve Outcome: Progressing   Problem: Clinical Measurements: Goal: Ability to maintain clinical measurements within normal limits will improve Outcome: Progressing Goal: Will remain free from infection Outcome: Progressing Goal: Diagnostic test results will improve Outcome: Progressing Goal: Respiratory complications will improve Outcome: Progressing Goal: Cardiovascular complication will be avoided Outcome: Progressing   Problem: Activity: Goal: Risk for activity intolerance will decrease Outcome: Progressing   Problem: Nutrition: Goal: Adequate nutrition will be maintained Outcome: Progressing   Problem: Coping: Goal: Level of anxiety will decrease Outcome: Progressing   Problem: Elimination: Goal: Will not  experience complications related to bowel motility Outcome: Progressing Goal: Will not experience complications related to urinary retention Outcome: Progressing   Problem: Pain Managment: Goal: General experience of comfort will improve and/or be controlled Outcome: Progressing   Problem: Safety: Goal: Ability to remain free from injury will improve Outcome: Progressing   Problem: Skin Integrity: Goal: Risk for impaired skin integrity will decrease Outcome: Progressing   Problem: Education: Goal: Ability to describe self-care measures that may prevent or decrease complications (Diabetes Survival Skills Education) will improve Outcome: Progressing Goal: Individualized Educational Video(s) Outcome: Progressing   Problem: Coping: Goal: Ability to adjust to condition or change in health will improve Outcome: Progressing   Problem: Fluid Volume: Goal: Ability to maintain a balanced intake and output will improve Outcome: Progressing   Problem: Health Behavior/Discharge Planning: Goal: Ability to identify and utilize available resources and services will improve Outcome: Progressing Goal: Ability to manage health-related needs will improve Outcome: Progressing   Problem: Metabolic: Goal: Ability to maintain appropriate glucose levels will improve Outcome: Progressing   Problem: Nutritional: Goal: Maintenance of adequate nutrition will improve Outcome: Progressing Goal: Progress toward achieving an optimal weight will improve Outcome: Progressing   Problem: Skin Integrity: Goal: Risk for impaired skin integrity will decrease Outcome: Progressing   Problem: Tissue Perfusion: Goal: Adequacy of tissue perfusion will improve Outcome: Progressing   Problem: Education: Goal: Ability to describe self-care measures that may prevent or decrease complications (Diabetes Survival Skills Education) will improve Outcome: Progressing Goal: Individualized Educational  Video(s) Outcome: Progressing   Problem: Coping: Goal: Ability to adjust to condition or change in health will improve Outcome: Progressing   Problem: Fluid Volume: Goal: Ability to maintain a balanced intake and output will improve Outcome: Progressing   Problem: Health Behavior/Discharge Planning: Goal: Ability to identify and utilize  available resources and services will improve Outcome: Progressing Goal: Ability to manage health-related needs will improve Outcome: Progressing   Problem: Metabolic: Goal: Ability to maintain appropriate glucose levels will improve Outcome: Progressing   Problem: Nutritional: Goal: Maintenance of adequate nutrition will improve Outcome: Progressing Goal: Progress toward achieving an optimal weight will improve Outcome: Progressing   Problem: Skin Integrity: Goal: Risk for impaired skin integrity will decrease Outcome: Progressing   Problem: Tissue Perfusion: Goal: Adequacy of tissue perfusion will improve Outcome: Progressing   Problem: Education: Goal: Ability to demonstrate management of disease process will improve Outcome: Progressing Goal: Ability to verbalize understanding of medication therapies will improve Outcome: Progressing Goal: Individualized Educational Video(s) Outcome: Progressing   Problem: Activity: Goal: Capacity to carry out activities will improve Outcome: Progressing   Problem: Cardiac: Goal: Ability to achieve and maintain adequate cardiopulmonary perfusion will improve Outcome: Progressing   Problem: Education: Goal: Knowledge of disease or condition will improve Outcome: Progressing Goal: Understanding of medication regimen will improve Outcome: Progressing Goal: Individualized Educational Video(s) Outcome: Progressing   Problem: Activity: Goal: Ability to tolerate increased activity will improve Outcome: Progressing   Problem: Cardiac: Goal: Ability to achieve and maintain adequate  cardiopulmonary perfusion will improve Outcome: Progressing   Problem: Health Behavior/Discharge Planning: Goal: Ability to safely manage health-related needs after discharge will improve Outcome: Progressing   Problem: Education: Goal: Knowledge of disease and its progression will improve Outcome: Progressing Goal: Individualized Educational Video(s) Outcome: Progressing   Problem: Fluid Volume: Goal: Compliance with measures to maintain balanced fluid volume will improve Outcome: Progressing   Problem: Health Behavior/Discharge Planning: Goal: Ability to manage health-related needs will improve Outcome: Progressing   Problem: Nutritional: Goal: Ability to make healthy dietary choices will improve Outcome: Progressing   Problem: Clinical Measurements: Goal: Complications related to the disease process, condition or treatment will be avoided or minimized Outcome: Progressing

## 2024-02-18 NOTE — [place] FL2 (Signed)
 [place]  MEDICAID FL2 LEVEL OF CARE FORM     IDENTIFICATION  Patient Name: Maria Pace Birthdate: 23-May-1940 Sex: female Admission Date (Current Location): 02/16/2024  [facility] and [place] Number:  [employer] and Address:  [facility],  [address] [place] [address]      Provider Number: [id number]  Attending Physician Name and Address:  Maree Adron BIRCH, DO  Relative Name and Phone Number:       Current Level of Care: Hospital Recommended Level of Care: Skilled Nursing Facility Prior Approval Number:    Date Approved/Denied:   PASRR Number: pending  Discharge Plan: SNF    Current Diagnoses: Patient Active Problem List   Diagnosis Date Noted   Chronic constipation 02/17/2024   Acute on chronic anemia 02/17/2024   Stercoral colitis 02/16/2024   Symptomatic anemia 10/21/2023   Melena 10/21/2023   Chronic anticoagulation 10/21/2023   Acute blood loss anemia 10/20/2023   Chronic HFrEF (heart failure with reduced ejection fraction) (HCC) 10/20/2023   Current use of long term anticoagulation 09/01/2023   PAF (paroxysmal atrial fibrillation) (HCC) 09/01/2023   Sepsis due to COVID-19 (HCC) 07/12/2022   Chronic combined systolic and diastolic congestive heart failure (HCC)    ARF (acute renal failure) 10/04/2019   Elevated CK 10/03/2019   SVT (supraventricular tachycardia) 10/03/2019   GERD (gastroesophageal reflux disease)    Schizophrenia (HCC) 11/15/2017   Hypotension 02/20/2017   Severe dehydration 02/20/2017   AKI (acute kidney injury) 02/20/2017   Constipation 02/20/2017   Lactic acidosis 02/20/2017   Urinary retention 11/10/2016   Hypertension 11/10/2016   Type 2 diabetes mellitus (HCC) 11/10/2016   Hyperlipidemia 11/10/2016   Hyponatremia 11/10/2016   SBO (small bowel obstruction) (HCC) 11/03/2015    Orientation RESPIRATION BLADDER Height & Weight     Self, Time, Place  O2 (2L) Incontinent Weight: 219 lb 2.2 oz (99.4  kg) Height:  5' (152.4 cm)  BEHAVIORAL SYMPTOMS/MOOD NEUROLOGICAL BOWEL NUTRITION STATUS      Incontinent Diet (See DC Summary)  AMBULATORY STATUS COMMUNICATION OF NEEDS Skin   Limited Assist Verbally Normal                       Personal Care Assistance Level of Assistance  Bathing, Feeding, Dressing Bathing Assistance: Limited assistance Feeding assistance: Independent Dressing Assistance: Limited assistance     Functional Limitations Info  Sight, Hearing, Speech Sight Info: Impaired Hearing Info: Impaired Speech Info: Adequate    SPECIAL CARE FACTORS FREQUENCY  PT (By licensed PT), OT (By licensed OT)     PT Frequency: 5x/wk OT Frequency: 5x/wk            Contractures Contractures Info: Not present    Additional Factors Info  Code Status, Allergies, Psychotropic Code Status Info: FULL Allergies Info: Haloperidol Lactate Psychotropic Info: Depakote , Zyprexa          Current Medications (02/18/2024):  This is the current hospital active medication list Current Facility-Administered Medications  Medication Dose Route Frequency Provider Last Rate Last Admin   acetaminophen  (TYLENOL ) tablet 650 mg  650 mg Oral Q6H PRN Elgergawy, Dawood S, MD       Or   acetaminophen  (TYLENOL ) suppository 650 mg  650 mg Rectal Q6H PRN Elgergawy, Dawood S, MD       albuterol  (PROVENTIL ) (2.5 MG/3ML) 0.083% nebulizer solution 2.5 mg  2.5 mg Nebulization Q2H PRN Elgergawy, Dawood S, MD       Chlorhexidine  Gluconate Cloth 2 %  PADS 6 each  6 each Topical Daily Shah, Pratik D, DO   6 each at 02/18/24 1001   divalproex  (DEPAKOTE  ER) 24 hr tablet 250 mg  250 mg Oral Daily Elgergawy, Dawood S, MD   250 mg at 02/18/24 1000   hydrALAZINE  (APRESOLINE ) injection 5 mg  5 mg Intravenous Q4H PRN Elgergawy, Dawood S, MD       hydrALAZINE  (APRESOLINE ) injection 5 mg  5 mg Intravenous Q4H PRN Elgergawy, Dawood S, MD       HYDROcodone -acetaminophen  (NORCO/VICODIN) 5-325 MG per tablet 1-2 tablet   1-2 tablet Oral Q4H PRN Elgergawy, Dawood S, MD   2 tablet at 02/17/24 2125   insulin  aspart (novoLOG ) injection 0-9 Units  0-9 Units Subcutaneous TID AC & HS Jesus America, NP   1 Units at 02/18/24 9191   latanoprost  (XALATAN ) 0.005 % ophthalmic solution 1 drop  1 drop Both Eyes QHS Elgergawy, Brayton RAMAN, MD   1 drop at 02/17/24 2126   levothyroxine  (SYNTHROID ) tablet 50 mcg  50 mcg Oral Q0600 Elgergawy, Dawood S, MD   50 mcg at 02/18/24 0631   liver oil-zinc  oxide (DESITIN) 40 % ointment   Topical Q0600 Shah, Pratik D, DO   Given at 02/18/24 0631   norepinephrine  (LEVOPHED ) 4mg  in (0.016 mg/mL) premix infusion  0-10 mcg/min Intravenous Titrated Maree, Pratik D, DO       OLANZapine  (ZYPREXA ) tablet 20 mg  20 mg Oral QHS Elgergawy, Dawood S, MD   20 mg at 02/17/24 2124   OLANZapine  (ZYPREXA ) tablet 5 mg  5 mg Oral QHS Elgergawy, Dawood S, MD   5 mg at 02/17/24 2124   ondansetron  (ZOFRAN ) tablet 4 mg  4 mg Oral Q6H PRN Elgergawy, Dawood S, MD       Or   ondansetron  (ZOFRAN ) injection 4 mg  4 mg Intravenous Q6H PRN Elgergawy, Dawood S, MD       pantoprazole  (PROTONIX ) EC tablet 40 mg  40 mg Oral BID Elgergawy, Dawood S, MD   40 mg at 02/18/24 9040   piperacillin -tazobactam (ZOSYN ) IVPB 3.375 g  3.375 g Intravenous Q8H Hunt, Madison H, RPH 12.5 mL/hr at 02/18/24 0438 3.375 g at 02/18/24 0438   polyethylene glycol (MIRALAX  / GLYCOLAX ) packet 17 g  17 g Oral BID Elgergawy, Dawood S, MD   17 g at 02/18/24 0959   senna-docusate (Senokot-S) tablet 1 tablet  1 tablet Oral BID Elgergawy, Dawood S, MD   1 tablet at 02/18/24 1000     Discharge Medications: Please see discharge summary for a list of discharge medications.  Relevant Imaging Results:  Relevant Lab Results:   Additional Information SSN: [id number] [place], [name]

## 2024-02-18 NOTE — TOC PASRR Note (Signed)
 30 Day PASRR Note   Patient Details  Name: Maria Pace Date of Birth: 1940/05/28   Transition of Care [name], [name] Phone Number: 02/18/2024, 10:56 AM  To Whom It May Concern:  Please be advised that this patient will require a short-term nursing home stay - anticipated 30 days or less for rehabilitation and strengthening.   The plan is for return home.

## 2024-02-19 ENCOUNTER — Inpatient Hospital Stay (HOSPITAL_COMMUNITY)

## 2024-02-19 DIAGNOSIS — Z515 Encounter for palliative care: Secondary | ICD-10-CM

## 2024-02-19 DIAGNOSIS — Z7189 Other specified counseling: Secondary | ICD-10-CM | POA: Diagnosis not present

## 2024-02-19 DIAGNOSIS — I5022 Chronic systolic (congestive) heart failure: Secondary | ICD-10-CM | POA: Diagnosis not present

## 2024-02-19 DIAGNOSIS — R008 Other abnormalities of heart beat: Secondary | ICD-10-CM | POA: Diagnosis not present

## 2024-02-19 DIAGNOSIS — D649 Anemia, unspecified: Secondary | ICD-10-CM | POA: Diagnosis not present

## 2024-02-19 DIAGNOSIS — K5289 Other specified noninfective gastroenteritis and colitis: Secondary | ICD-10-CM | POA: Diagnosis not present

## 2024-02-19 LAB — BASIC METABOLIC PANEL WITH GFR
Anion gap: 13 (ref 5–15)
BUN: 40 mg/dL — ABNORMAL HIGH (ref 8–23)
CO2: 21 mmol/L — ABNORMAL LOW (ref 22–32)
Calcium: 8 mg/dL — ABNORMAL LOW (ref 8.9–10.3)
Chloride: 98 mmol/L (ref 98–111)
Creatinine, Ser: 2.73 mg/dL — ABNORMAL HIGH (ref 0.44–1.00)
GFR, Estimated: 17 mL/min — ABNORMAL LOW (ref >60)
Glucose, Bld: 86 mg/dL (ref 70–99)
Potassium: 4.3 mmol/L (ref 3.5–5.1)
Sodium: 132 mmol/L — ABNORMAL LOW (ref 135–145)

## 2024-02-19 LAB — GLUCOSE, CAPILLARY
Glucose-Capillary: 126 mg/dL — ABNORMAL HIGH (ref 70–99)
Glucose-Capillary: 146 mg/dL — ABNORMAL HIGH (ref 70–99)
Glucose-Capillary: 96 mg/dL (ref 70–99)
Glucose-Capillary: 166 mg/dL — ABNORMAL HIGH (ref 70–99)

## 2024-02-19 LAB — ECHOCARDIOGRAM COMPLETE
Area-P 1/2: 5.79 cm2
Height: 60 in
S' Lateral: 4.5 cm
Weight: 3559.11 [oz_av]

## 2024-02-19 LAB — CBC
HCT: 28.9 % — ABNORMAL LOW (ref 36.0–46.0)
Hemoglobin: 9.4 g/dL — ABNORMAL LOW (ref 12.0–15.0)
MCH: 29.7 pg (ref 26.0–34.0)
MCHC: 32.5 g/dL (ref 30.0–36.0)
MCV: 91.2 fL (ref 80.0–100.0)
Platelets: 202 K/uL (ref 150–400)
RBC: 3.17 MIL/uL — ABNORMAL LOW (ref 3.87–5.11)
RDW: 15.2 % (ref 11.5–15.5)
WBC: 11 K/uL — ABNORMAL HIGH (ref 4.0–10.5)
nRBC: 0 % (ref 0.0–0.2)

## 2024-02-19 LAB — CORTISOL: Cortisol, Plasma: 14.5 ug/dL

## 2024-02-19 LAB — MAGNESIUM: Magnesium: 2.1 mg/dL (ref 1.7–2.4)

## 2024-02-19 MED ORDER — SODIUM CHLORIDE 0.9 % IV SOLN: INTRAVENOUS | Status: DC

## 2024-02-19 MED ORDER — POLYETHYLENE GLYCOL 3350 17 G PO PACK: 17.0000 g | PACK | Freq: Every day | ORAL | Status: DC | PRN

## 2024-02-19 MED ORDER — PERFLUTREN LIPID MICROSPHERE
1.0000 mL | INTRAVENOUS | Status: AC | PRN
  Administered 2024-02-19: 2 mL via INTRAVENOUS

## 2024-02-19 MED ORDER — MIDODRINE HCL 5 MG PO TABS
10.0000 mg | ORAL_TABLET | Freq: Three times a day (TID) | ORAL | Status: DC
  Administered 2024-02-19 – 2024-03-01 (×32): 10 mg via ORAL
  Filled 2024-02-19 (×32): qty 2

## 2024-02-19 NOTE — Consult Note (Signed)
 Consultation Note Date: 02/19/2024   Patient Name: Maria Pace  DOB: Aug 16, 1940  MRN: [id number]  Age / Sex: 83 y.o., female  PCP: Wilmon Penton, NP Referring Physician: Maree Adron BIRCH, DO  Reason for Consultation: Establishing goals of care  HPI/Patient Profile: 83 y.o. female  with past medical history of schizophrenia, bipolar 1, GIB, afib (no anticoagulation d/t GIB), HFrEF, HTN, HLD, DM2, GERD admitted on 02/16/2024 from group home with large amount of diarrhea with concern for blood.   Clinical Assessment and Goals of Care: Consult received and chart review completed. I met today at Maria Pace's bedside but no family present. Maria Pace is receiving ECHO. Maria Pace awakens and will engage and discuss with me. She is oriented to person, place, year. She has very poor understanding of her medical situation and does not have insight to determine risks vs benefits of decisions to be made such as blood transfusion, endoscopy, etc. Plans for emergency guardianship hearing scheduled for tomorrow per CSW - I agree that guardianship is needed.   Update: Notified by RN that sister is at bedside. I came to bedside. Sister, Vernell, is pursuing guardianship for Maria Pace. We discussed Maria Pace health situation and potential decisions to be made. Vernell shares that she wants her sister to have any intervention that may help her including endoscopy/colonoscopy and blood transfusion if indicated. We did speak about Maria Pace's refusal and that we can provide certain cares to her even if she refuses. However, there are certain interventions such as participating with PT/OT that we cannot force or control although we can strongly encourage. Vernell shares that Maria Pace can often be very stubborn. We did discuss if she refuses such cares that this can lead down a difficult path putting her at higher risk for serious health  complications. Vernell expresses understanding.   I discussed further with Vernell about her sister's QOL. Vernell shares that this was good before she stopped walking and helping to care for herself. Vernell very much wants Maria Pace to participate and improve to ultimately return to group home but understands that she may need increased care in a facility if this does not happen. Vernell still works full time and cannot care for her. I discussed with Vernell is Maria Pace has ever discussed with her any indication of her wishes in certain situations. We discussed her heart disease and underlying health conditions that can be further complicated by her mental health. We discussed wishes for resuscitation. Vernell shares that she is not sure what Maria Pace would want or if she would be willing to discuss. I did share that as her guardian these are the decisions that she will be facing. Vernell expresses understanding. She will continue to consider. She plans on coming by to visit after court tomorrow. I will plan to bring her Hard Choices booklet and continue our conversation.   All questions/concerns addressed. Emotional support provided.   Primary Decision Maker NEXT OF KIN sister Vernell applying for guardianship (there are multiple other  siblings but Vernell has been her main support)    SUMMARY OF RECOMMENDATIONS   - Ongoing GOC conversations  Code Status/Advance Care Planning: Full code   Symptom Management:  Per attending, GI  Prognosis:  Overall poor. Time for outcomes and progress.   Discharge Planning: To Be Determined      Primary Diagnoses: Present on Admission:  Stercoral colitis  Severe dehydration  Schizophrenia (HCC)  PAF (paroxysmal atrial fibrillation) (HCC)  Hypertension  GERD (gastroesophageal reflux disease)  Chronic HFrEF (heart failure with reduced ejection fraction) (HCC)   I have reviewed the medical record, interviewed the patient and family, and examined  the patient. The following aspects are pertinent.  Past Medical History:  Diagnosis Date   Bipolar 1 disorder (HCC)    Essential hypertension    GERD (gastroesophageal reflux disease)    Hyperlipidemia    Irregular heart beat    Schizophrenia (HCC)    Type 2 diabetes mellitus (HCC)    Social History   Socioeconomic History   Marital status: Single    Spouse name: Not on file   Number of children: Not on file   Years of education: Not on file   Highest education level: Not on file  Occupational History   Not on file  Tobacco Use   Smoking status: Never    Passive exposure: Never   Smokeless tobacco: Never  Vaping Use   Vaping status: Never Used  Substance and Sexual Activity   Alcohol  use: No   Drug use: No   Sexual activity: Never    Birth control/protection: Abstinence  Other Topics Concern   Not on file  Social History Narrative   Pt states that she is not married, nor in a relationship of any kind. Pt states that she lives at the group home and has help at the group home to make sure I get my medications and stuff when I need them. Pt denies any drug or alcohol  use. Denies smoking. States that she usually ambulates with a walker at the group home and is able to walk up and down the [place] to get my medicine but admits to having multiple falls as of recently. Pt states she has not walked in days now.   Social Drivers of Corporate Investment Banker Strain: Not on file  Food Insecurity: No Food Insecurity (02/16/2024)   Hunger Vital Sign    Worried About Running Out of Food in the Last Year: Never true    Ran Out of Food in the Last Year: Never true  Transportation Needs: No Transportation Needs (02/16/2024)   PRAPARE - Administrator, Civil Service (Medical): No    Lack of Transportation (Non-Medical): No  Recent Concern: Transportation Needs - Unmet Transportation Needs (02/16/2024)   PRAPARE - Administrator, Civil Service (Medical):  Yes    Lack of Transportation (Non-Medical): Yes  Physical Activity: Not on file  Stress: Not on file  Social Connections: Unknown (02/16/2024)   Social Connection and Isolation Panel    Frequency of Communication with Friends and Family: More than three times a week    Frequency of Social Gatherings with Friends and Family: Never    Attends Religious Services: Patient declined    Database Administrator or Organizations: Patient declined    Attends Banker Meetings: Patient declined    Marital Status: Never married   Family History  Problem Relation Age of Onset   Hypertension Father  Diabetes type II Father    Scheduled Meds:  Chlorhexidine  Gluconate Cloth  6 each Topical Daily   divalproex   250 mg Oral Daily   insulin  aspart  0-9 Units Subcutaneous TID AC & HS   latanoprost   1 drop Both Eyes QHS   levothyroxine   50 mcg Oral Q0600   liver oil-zinc  oxide   Topical Q0600   midodrine   10 mg Oral TID WC   OLANZapine   20 mg Oral QHS   OLANZapine   5 mg Oral QHS   pantoprazole   40 mg Oral BID   senna-docusate  1 tablet Oral BID   Continuous Infusions:  sodium chloride  10 mL/hr at 02/19/24 0700   norepinephrine  (LEVOPHED ) Adult infusion 6 mcg/min (02/19/24 0700)   piperacillin -tazobactam (ZOSYN )  IV 3.375 g (02/19/24 1010)   PRN Meds:.sodium chloride , acetaminophen  **OR** acetaminophen , albuterol , hydrALAZINE , hydrALAZINE , HYDROcodone -acetaminophen , ondansetron  **OR** ondansetron  (ZOFRAN ) IV, mouth rinse, polyethylene glycol Allergies  Allergen Reactions   Haloperidol Lactate Other (See Comments)    Couldn't urinate anymore   Review of Systems  Unable to perform ROS: Psychiatric disorder    Physical Exam Vitals and nursing note reviewed.  Constitutional:      General: She is not in acute distress.    Appearance: She is ill-appearing.  Cardiovascular:     Rate and Rhythm: Normal rate.  Pulmonary:     Effort: No tachypnea, accessory muscle usage or  respiratory distress.  Abdominal:     Palpations: Abdomen is soft.  Neurological:     Mental Status: She is alert.     Comments: Oriented to person, place, year. Not oriented to situation.   Psychiatric:        Cognition and Memory: Cognition is impaired. Memory is impaired.        Judgment: Judgment is inappropriate.     Vital Signs: BP 118/69   Pulse 99   Temp 99.3 F (37.4 C)   Resp 14   Ht 5' (1.524 m)   Wt 100.9 kg   SpO2 100%   BMI 43.44 kg/m  Pain Scale: 0-10 POSS *See Group Information*: S-Acceptable,Sleep, easy to arouse Pain Score: 0-No pain   SpO2: SpO2: 100 % O2 Device:SpO2: 100 % O2 Flow Rate: .O2 Flow Rate (L/min): 2 L/min  IO: Intake/output summary:  Intake/Output Summary (Last 24 hours) at 02/19/2024 1207 Last data filed at 02/19/2024 1037 Gross per 24 hour  Intake 1372.33 ml  Output 1565 ml  Net -192.67 ml    LBM: Last BM Date : 02/18/24 Baseline Weight: Weight: 97.3 kg Most recent weight: Weight: 100.9 kg     Palliative Assessment/Data:    Time Total: 75 min  Greater than 50%  of this time was spent counseling and coordinating care related to the above assessment and plan.  Signed by: [name], NP Palliative Medicine Team Pager # [phone] (M-F 8a-5p) Team Phone # [phone] (Nights/Weekends)

## 2024-02-19 NOTE — Progress Notes (Signed)
  Echocardiogram 2D Echocardiogram has been performed.  [place] FORBES Gosling RDCS 02/19/2024, 1:06 PM

## 2024-02-19 NOTE — Progress Notes (Signed)
 PROGRESS NOTE    Maria Pace  FMW:[id number] DOB: [birth year] DOA: 02/16/2024 PCP: Wilmon Penton, NP   Brief Narrative:    Maria Pace  is a 83 y.o. female,    with medical history significant of bipolar 1 disorder, schizophrenia, essential hypertension, GERD, hyperlipidemia, history of irregular heartbeat, type 2 diabetes mellitus, PAF on apixaban , HFrEF (EF 30-35%), HTN, patient with hospitalization last July, due to acute anemia from GI bleed, for which she refused endoscopy/colonoscopy . - Was sent from her group home as she was found blood with large amount of diarrhea, dark stools, poor historian due to schizophrenia, she is answering question appropriately but cannot give exact details, she had anemia in July, refused GI evaluation, unfortunately there is no medication list by the patient, by by reviewing dispense record it does appear she is not anymore on her metoprolol  or Eliquis .  Patient has been admitted for stercoral colitis with manual disimpaction performed in the ED.  She remains on IV Zosyn  and has been seen by GI with plans to advance diet as tolerated as well as aggressive bowel regimen.  She has previously refused any endoscopy and plans are for supportive care as well as potential SNF on discharge.  Transferred to stepdown unit 11/23 for closer blood pressure monitoring with low readings noted this a.m. likely from inaccurate blood pressure readings.  Started on IV fluid for AKI.  She continues to require norepinephrine  for blood pressure support.  Assessment & Plan:   Principal Problem:   Stercoral colitis Active Problems:   Hypertension   Type 2 diabetes mellitus (HCC)   Severe dehydration   Schizophrenia (HCC)   GERD (gastroesophageal reflux disease)   PAF (paroxysmal atrial fibrillation) (HCC)   Chronic HFrEF (heart failure with reduced ejection fraction) (HCC)   Chronic constipation   Acute on chronic anemia  Assessment and Plan:  Sepsis,  POA Sterocoral colitis Hemoccult positive stool -Sepsis POA, febrile 1.4, with leukocytosis and tachypnea  -Occult positive stool, but hemoglobin is stable, had manual disimpaction in ED, impacted stool was brown in color, no melena or bright red blood per rectum, followed by diarrhea dark gray in color, no melena, Hemoccult positive stool most likely due to colitis, as well she is on iron  tablets. -Follow-up blood cultures with no growth at this time -Most likely in the setting of stercoral colitis -Continue with IV Zosyn . - Continue on laxative regimen - She is on aspirin , will hold - Appreciate GI recommendations to conservatively manage and follow CBC -CTA GI bleeding protocol is negative -Appreciate palliative consultation for goals of care -Check cortisol and 2D echocardiogram   Chronic HFrEF - 09/26/2023 echo EF 30-35%, normal RVF, moderate MR - Patient with lower extremity edema -Given she presents with sepsis we will hold on diuresis -Pressure is soft we will hold on Entresto  -Does appear her metoprolol  has been discontinued due to soft blood pressure in the past -Resume Aldactone  once stable, continue to hold for now - Hold further IV fluid   Paroxysmal atrial fibrillation - Currently in sinus rhythm - Continue with amiodarone  -By reviewing records it does appear she has not been on Eliquis  for few months currently, and I would assume this is most likely in the setting of her previous anemia due to GI bleed and refusing anemia workup as it does appear risks outweighed benefits anticoagulation   Schizophrenia, unspecified type - Continue olanzapine  - Continue Depakote    AKI on CKD stage IIIb with hyponatremia and metabolic acidosis -Likely  in the setting of hypotension with creatinine elevation ongoing, but no oliguria -Start IV normal saline -Avoid nephrotoxic agents -Strict I's and O's -Follow lab work -Plan to start bicarbonate   Diabetes mellitus type 2,  controlled - A1c, hold metformin and keep on insulin  sliding scale - NovoLog  sliding scale - Holding metformin   Mixed hyperlipidemia - Continue statin  Morbid obesity-class III BMI 41.89  DVT prophylaxis:SCDs Code Status: Full Family Communication: None at bedside Disposition Plan:  Status is: Inpatient Remains inpatient appropriate because: Need for IV medications.   Consultants:  GI Palliative  Procedures:  None  Antimicrobials:  Anti-infectives (From admission, onward)    Start     Dose/Rate Route Frequency Ordered Stop   02/18/24 2200  piperacillin -tazobactam (ZOSYN ) IVPB 3.375 g        3.375 g 12.5 mL/hr over 240 Minutes Intravenous Every 12 hours 02/18/24 1352     02/17/24 0400  piperacillin -tazobactam (ZOSYN ) IVPB 3.375 g  Status:  Discontinued        3.375 g 12.5 mL/hr over 240 Minutes Intravenous Every 8 hours 02/16/24 2126 02/18/24 1352   02/16/24 2000  piperacillin -tazobactam (ZOSYN ) IVPB 3.375 g        3.375 g 100 mL/hr over 30 Minutes Intravenous  Once 02/16/24 1949 02/17/24 1200       Subjective: Patient seen and evaluated today with no new acute complaints or concerns.  She continues to have frequent bowel movements, but denies any abdominal pain nausea, or vomiting.  She continues to have hypotension requiring norepinephrine  infusion.  Objective: Vitals:   02/19/24 1130 02/19/24 1217 02/19/24 1230 02/19/24 1245  BP: 118/69 95/64 (!) 111/58 (!) 104/59  Pulse:      Resp: 14  (!) 25 (!) 25  Temp: 99.3 F (37.4 C)  99.1 F (37.3 C) 99.1 F (37.3 C)  TempSrc:      SpO2:      Weight:      Height:        Intake/Output Summary (Last 24 hours) at 02/19/2024 1335 Last data filed at 02/19/2024 1037 Gross per 24 hour  Intake 1372.33 ml  Output 1565 ml  Net -192.67 ml   Filed Weights   02/16/24 2112 02/18/24 0758 02/19/24 0445  Weight: 97.3 kg 99.4 kg 100.9 kg    Examination:  General exam: Appears calm and comfortable,  obese Respiratory system: Clear to auscultation. Respiratory effort normal.  Nasal cannula oxygen Cardiovascular system: S1 & S2 heard, RRR.  Gastrointestinal system: Abdomen is soft Central nervous system: Alert and awake Extremities: No edema Skin: No significant lesions noted Psychiatry: Flat affect.    Data Reviewed: I have personally reviewed following labs and imaging studies  CBC: Recent Labs  Lab 02/16/24 1610 02/16/24 2025 02/17/24 0445 02/18/24 0521 02/19/24 0435  WBC 12.3* 11.5* 10.9* 10.3 11.0*  NEUTROABS 9.5*  --   --   --   --   HGB 12.1 10.8* 10.3* 9.5* 9.4*  HCT 38.0 33.7* 32.1* 29.7* 28.9*  MCV 92.5 92.8 92.5 93.1 91.2  PLT 219 202 187 183 202   Basic Metabolic Panel: Recent Labs  Lab 02/16/24 1610 02/17/24 0445 02/18/24 0521 02/19/24 0435  NA 140 137 131* 132*  K 4.3 4.3 4.4 4.3  CL 101 102 96* 98  CO2 23 22 21* 21*  GLUCOSE 119* 76 116* 86  BUN 34* 35* 41* 40*  CREATININE 1.66* 1.68* 2.55* 2.73*  CALCIUM  8.8* 8.3* 8.1* 8.0*  MG  --   --  1.7 2.1   GFR: Estimated Creatinine Clearance: 17.3 mL/min (A) (by C-G formula based on SCr of 2.73 mg/dL (H)). Liver Function Tests: Recent Labs  Lab 02/16/24 1610  AST 49*  ALT 33  ALKPHOS 88  BILITOT 0.4  PROT 7.0  ALBUMIN 3.6   No results for input(s): LIPASE, AMYLASE in the last 168 hours. No results for input(s): AMMONIA in the last 168 hours. Coagulation Profile: Recent Labs  Lab 02/16/24 1610  INR 1.0   Cardiac Enzymes: No results for input(s): CKTOTAL, CKMB, CKMBINDEX, TROPONINI in the last 168 hours. BNP (last 3 results) No results for input(s): PROBNP in the last 8760 hours. HbA1C: No results for input(s): HGBA1C in the last 72 hours. CBG: Recent Labs  Lab 02/18/24 1120 02/18/24 1621 02/18/24 2123 02/19/24 0723 02/19/24 1144  GLUCAP 238* 229* 157* 126* 146*   Lipid Profile: No results for input(s): CHOL, HDL, LDLCALC, TRIG, CHOLHDL, LDLDIRECT  in the last 72 hours. Thyroid  Function Tests: No results for input(s): TSH, T4TOTAL, FREET4, T3FREE, THYROIDAB in the last 72 hours. Anemia Panel: No results for input(s): VITAMINB12, FOLATE, FERRITIN, TIBC, IRON , RETICCTPCT in the last 72 hours. Sepsis Labs: Recent Labs  Lab 02/16/24 2025 02/16/24 2327 02/18/24 0734 02/18/24 1022  LATICACIDVEN 3.1* 2.0* 1.6 1.8    Recent Results (from the past 240 hours)  Culture, blood (routine x 2)     Status: None (Preliminary result)   Collection Time: 02/16/24  8:25 PM   Specimen: BLOOD  Result Value Ref Range Status   Specimen Description BLOOD LEFT ANTECUBITAL  Final   Special Requests   Final    BOTTLES DRAWN AEROBIC ONLY Blood Culture adequate volume   Culture   Final    NO GROWTH 3 DAYS Performed at [facility], [address], [place], [place] [address]    Report Status PENDING  Incomplete  Culture, blood (routine x 2)     Status: None (Preliminary result)   Collection Time: 02/16/24  8:34 PM   Specimen: BLOOD  Result Value Ref Range Status   Specimen Description BLOOD RIGHT ANTECUBITAL  Final   Special Requests   Final    BOTTLES DRAWN AEROBIC AND ANAEROBIC Blood Culture adequate volume   Culture   Final    NO GROWTH 3 DAYS Performed at [facility], [address], [place], [place] [address]    Report Status PENDING  Incomplete  MRSA Next Gen by PCR, Nasal     Status: None   Collection Time: 02/18/24  8:10 AM   Specimen: Nasal Mucosa; Nasal Swab  Result Value Ref Range Status   MRSA by PCR Next Gen NOT DETECTED NOT DETECTED Final    Comment: (NOTE) The GeneXpert MRSA Assay (FDA approved for NASAL specimens only), is one component of a comprehensive MRSA colonization surveillance program. It is not intended to diagnose MRSA infection nor to guide or monitor treatment for MRSA infections. Test performance is not FDA approved in patients less than 82 years old. Performed at [facility], [address], [place], [place] [address]          Radiology Studies: No results found.       Scheduled Meds:  Chlorhexidine  Gluconate Cloth  6 each Topical Daily   divalproex   250 mg Oral Daily   insulin  aspart  0-9 Units Subcutaneous TID AC & HS   latanoprost   1 drop Both Eyes QHS   levothyroxine   50 mcg Oral Q0600   liver oil-zinc  oxide  Topical Q0600   midodrine   10 mg Oral TID WC   OLANZapine   20 mg Oral QHS   OLANZapine   5 mg Oral QHS   pantoprazole   40 mg Oral BID   senna-docusate  1 tablet Oral BID   Continuous Infusions:  sodium chloride  10 mL/hr at 02/19/24 0700   sodium chloride      norepinephrine  (LEVOPHED ) Adult infusion 6 mcg/min (02/19/24 0700)   piperacillin -tazobactam (ZOSYN )  IV 3.375 g (02/19/24 1010)     LOS: 3 days    Critical care time spent: 55 minutes.    Hermina Barnard JONETTA Fairly, DO [facility]  If 7PM-7AM, please contact night-coverage [email or web address] 02/19/2024, 1:35 PM

## 2024-02-19 NOTE — TOC Progression Note (Signed)
 Transition of Care [facility]) - Progression Note    Patient Details  Name: Maria Pace MRN: [id number] Date of Birth: May 12, 1940  [facility]) CM/SW Contact  Lucie Lunger, [place] Phone Number: 02/19/2024, 11:17 AM  Clinical Narrative:    CSW notes there are no bed offers for SNF at this time. CSW sent SNF referral out further to [facility] facilities to review. CSW left secure VM for DSS case worker Candelaria Gull requesting call back to provide update for D/C. TOC to follow.   Expected Discharge Plan: Group Home Barriers to Discharge: Continued Medical Work up               Expected Discharge Plan and Services In-house Referral: Clinical Social Work Discharge Planning Services: CM Consult Post Acute Care Choice: Home Health Living arrangements for the past 2 months: Group Home                                       Social Drivers of Health (SDOH) Interventions SDOH Screenings   Food Insecurity: No Food Insecurity (02/16/2024)  Housing: Low Risk  (02/16/2024)  Transportation Needs: No Transportation Needs (02/16/2024)  Recent Concern: Transportation Needs - Unmet Transportation Needs (02/16/2024)  Utilities: Not At Risk (02/16/2024)  Depression (PHQ2-9): High Risk (09/06/2021)  Social Connections: Unknown (02/16/2024)  Tobacco Use: Low Risk  (02/17/2024)    Readmission Risk Interventions    02/18/2024   10:48 AM 02/17/2024   12:13 PM 07/14/2022   11:16 AM  Readmission Risk Prevention Plan  Transportation Screening Complete Complete Complete  PCP or Specialist Appt within 5-7 Days   Not Complete  PCP or Specialist Appt within 3-5 Days Complete    Home Care Screening   Complete  Medication Review (RN CM)   Complete  HRI or Home Care Consult Complete Complete   Social Work Consult for Recovery Care Planning/Counseling Complete Complete   Palliative Care Screening Not Applicable Not Applicable   Medication Review Oceanographer) Complete  Complete

## 2024-02-19 NOTE — Progress Notes (Signed)
 Gastroenterology Progress Note    Primary Gastroenterologist:  Dr. Cindie  Patient ID: Maria Pace; [id number]; [birth year]    Subjective   No nausea, vomiting. Tolerated breakfast well. Ate decent amount but some was difficult due to endentulous status. 100% of meal eaten last evening. 1 BM overnight but not charted amount. Looser BM this morning with mucus, green and dark brown/black mix. Patient notes mild cramping prior to bowel movement then relieved. Continues to decline any endoscopic evaluation, stating God will work it out. Weaning down pressors, started at 6, now on 4.    Objective   Vital signs in last 24 hours Temp:  [97.7 F (36.5 C)-100.2 F (37.9 C)] 99.1 F (37.3 C) (11/24 0745) Pulse Rate:  [70-146] 84 (11/24 0715) Resp:  [11-24] 20 (11/24 0900) BP: (74-141)/(35-97) 89/54 (11/24 0900) SpO2:  [93 %-100 %] 100 % (11/24 0715) Weight:  [100.9 kg] 100.9 kg (11/24 0445) Last BM Date : 02/18/24  Physical Exam General:   Alert and oriented to person, states the date is Feb 20, 2024 Head:  Normocephalic and atraumatic. Eyes:  No icterus, sclera clear. Conjuctiva pink.  Abdomen:  Obese, large AP diameter, soft, no TTP.  Skin:  Warm and dry, intact without significant lesions.    Intake/Output from previous day: 11/23 0701 - 11/24 0700 In: 1132.3 [P.O.:240; I.V.:842.2; IV Piggyback:50.2] Out: 1265 [Urine:1265] Intake/Output this shift: No intake/output data recorded.  Lab Results  Recent Labs    02/17/24 0445 02/18/24 0521 02/19/24 0435  WBC 10.9* 10.3 11.0*  HGB 10.3* 9.5* 9.4*  HCT 32.1* 29.7* 28.9*  PLT 187 183 202   BMET Recent Labs    02/17/24 0445 02/18/24 0521 02/19/24 0435  NA 137 131* 132*  K 4.3 4.4 4.3  CL 102 96* 98  CO2 22 21* 21*  GLUCOSE 76 116* 86  BUN 35* 41* 40*  CREATININE 1.68* 2.55* 2.73*  CALCIUM  8.3* 8.1* 8.0*   LFT Recent Labs    02/16/24 1610  PROT 7.0  ALBUMIN 3.6  AST 49*  ALT 33  ALKPHOS 88  BILITOT  0.4   PT/INR Recent Labs    02/16/24 1610  LABPROT 14.1  INR 1.0    Studies/Results CT ANGIO GI BLEED Result Date: 02/16/2024 EXAM: CTA ABDOMEN AND PELVIS WITH CONTRAST 02/16/2024 07:33:33 PM TECHNIQUE: CTA images of the abdomen and pelvis with intravenous contrast. Three-dimensional MIP/volume rendered formations were performed. Automated exposure control, iterative reconstruction, and/or weight based adjustment of the mA/kV was utilized to reduce the radiation dose to as low as reasonably achievable. 75 mL of iohexol  (OMNIPAQUE ) 350 MG/ML injection was administered. COMPARISON: 02/20/2017 CLINICAL HISTORY: fever. melena FINDINGS: VASCULATURE: GI BLEED: No active contrast extravasation to localize GI bleed. AORTA: Aortic atherosclerosis. No acute finding. No abdominal aortic aneurysm. No dissection. CELIAC TRUNK: No acute finding. No occlusion or significant stenosis. SUPERIOR MESENTERIC ARTERY: No acute finding. No occlusion or significant stenosis. INFERIOR MESENTERIC ARTERY: No acute finding. No occlusion or significant stenosis. RENAL ARTERIES: No acute finding. No occlusion or significant stenosis. ILIAC ARTERIES: No acute finding. No occlusion or significant stenosis. ABDOMEN/PELVIS: LOWER CHEST: Ground glass opacities in the right lower lobe could reflect atelectasis or infiltrates. Mild cardiomegaly. LIVER: The liver is unremarkable. GALLBLADDER AND BILE DUCTS: Layering high-density material within the gallbladder, presumably small stones or sludge. No biliary ductal dilatation. SPLEEN: The spleen is unremarkable. PANCREAS: The pancreas is unremarkable. ADRENAL GLANDS: Bilateral adrenal glands demonstrate no acute abnormality. KIDNEYS, URETERS AND BLADDER: Foley catheter  in the bladder, which is decompressed. No stones in the kidneys or ureters. No hydronephrosis. No perinephric or periureteral stranding. GI AND BOWEL: Large stool burden throughout the colon. Large stool burden in the  rectosigmoid colon with wall thickening suggesting stercoral colitis. Stomach and duodenal sweep demonstrate no acute abnormality. There is no bowel obstruction. No abnormal bowel wall distension. REPRODUCTIVE: Reproductive organs are unremarkable. PERITONEUM AND RETROPERITONEUM: No ascites or free air. LYMPH NODES: No lymphadenopathy. BONES AND SOFT TISSUES: No acute abnormality of the bones. No acute soft tissue abnormality. IMPRESSION: 1. No active GI bleeding. 2. Large stool burden in the rectosigmoid colon with wall thickening, suggestive of stercoral colitis. 3. Right lower lobe ground-glass opacities, which may represent atelectasis or infiltrate. 4. Mild cardiomegaly. 5. Cholelithiasis Electronically signed by: Franky Crease MD 02/16/2024 07:39 PM EST RP Workstation: [id number]   DG Chest Portable 1 View Result Date: 02/16/2024 CLINICAL DATA:  Altered mental status.  Gastrointestinal bleeding. EXAM: PORTABLE CHEST 1 VIEW COMPARISON:  10/20/2023. FINDINGS: Very poor depth of inspiration. Stable enlarged cardiac silhouette. Clear lungs with normal vascularity. Diffuse osteopenia. Moderate right glenohumeral degenerative changes. IMPRESSION: 1. No acute abnormality. 2. Stable cardiomegaly. Electronically Signed   By: Elspeth Bathe M.D.   On: 02/16/2024 15:07    Assessment  83 y.o. female with a history of bipolar disorder, schizophrenia, GERD, dyslipidemia, paroxysmal atrial fibrillation (no longer on apixaban ), HFrEF (EF 30-35%), HTN, prior GI bleed with symptomatic anemia in July 2025 requiring blood transfusion but declined procedures, now presenting from [facility] this admission after found in large amount of black stool. Admitted with sepsis, findings of stercoral colitis on CTA,  and GI now following.   Hx of GI Bleed now with heme positive stool, acute on chronic anemia: July 2025 on Eliquis  and now has been on hold, declining colonoscopy/EGD at that time. CTA this admission  without active bleeding. Admitting Hgb 12.1>10.8>10.3>9.5 and now 9.4 this morning, stable in past 24 hours. Notably, her Hgb was 7.9 at discharge in July 2025. Dark green/brown/mix of black stool this morning with mucus per RN, small amount. Continues to decline procedures. Would recommend revisiting EGD again at minimum if she has any further melena or drops in Hgb. Unable to rule out occult colonic etiology.   Stercoral colitis: s/p manual disimpaction in ED, on empiric antibiotics, bowel regimen including Senna BID, Miralax   and lactulose  d/c yesterday due to looser stools. Need to continue proactive regimen and may need to introduce back Miralax .   Hypotension: on pressor support with levo but titrating down this morning as BP improving.     Plan / Recommendations  Continue PPI BID Follow H/H Continue Senna BID; will order Miralax  prn and can change to scheduled if needed Recommend at minimum EGD if willing, otherwise supportive measures as she is declining Soft diet    LOS: 3 days    02/19/2024, 9:44 AM  Therisa MICAEL Stager, PhD, ANP-BC [facility]

## 2024-02-20 DIAGNOSIS — K922 Gastrointestinal hemorrhage, unspecified: Secondary | ICD-10-CM | POA: Diagnosis not present

## 2024-02-20 DIAGNOSIS — I5022 Chronic systolic (congestive) heart failure: Secondary | ICD-10-CM | POA: Diagnosis not present

## 2024-02-20 DIAGNOSIS — Z515 Encounter for palliative care: Secondary | ICD-10-CM | POA: Diagnosis not present

## 2024-02-20 DIAGNOSIS — Z7189 Other specified counseling: Secondary | ICD-10-CM | POA: Diagnosis not present

## 2024-02-20 DIAGNOSIS — D649 Anemia, unspecified: Secondary | ICD-10-CM | POA: Diagnosis not present

## 2024-02-20 DIAGNOSIS — K5289 Other specified noninfective gastroenteritis and colitis: Secondary | ICD-10-CM | POA: Diagnosis not present

## 2024-02-20 LAB — CBC
HCT: 31.7 % — ABNORMAL LOW (ref 36.0–46.0)
Hemoglobin: 10.2 g/dL — ABNORMAL LOW (ref 12.0–15.0)
MCH: 30 pg (ref 26.0–34.0)
MCHC: 32.2 g/dL (ref 30.0–36.0)
MCV: 93.2 fL (ref 80.0–100.0)
Platelets: 197 K/uL (ref 150–400)
RBC: 3.4 MIL/uL — ABNORMAL LOW (ref 3.87–5.11)
RDW: 15.5 % (ref 11.5–15.5)
WBC: 8.4 K/uL (ref 4.0–10.5)
nRBC: 0 % (ref 0.0–0.2)

## 2024-02-20 LAB — BASIC METABOLIC PANEL WITH GFR
Anion gap: 9 (ref 5–15)
BUN: 29 mg/dL — ABNORMAL HIGH (ref 8–23)
CO2: 19 mmol/L — ABNORMAL LOW (ref 22–32)
Calcium: 8.2 mg/dL — ABNORMAL LOW (ref 8.9–10.3)
Chloride: 107 mmol/L (ref 98–111)
Creatinine, Ser: 1.68 mg/dL — ABNORMAL HIGH (ref 0.44–1.00)
GFR, Estimated: 30 mL/min — ABNORMAL LOW (ref >60)
Glucose, Bld: 101 mg/dL — ABNORMAL HIGH (ref 70–99)
Potassium: 4.1 mmol/L (ref 3.5–5.1)
Sodium: 135 mmol/L (ref 135–145)

## 2024-02-20 LAB — GLUCOSE, CAPILLARY
Glucose-Capillary: 114 mg/dL — ABNORMAL HIGH (ref 70–99)
Glucose-Capillary: 124 mg/dL — ABNORMAL HIGH (ref 70–99)
Glucose-Capillary: 119 mg/dL — ABNORMAL HIGH (ref 70–99)
Glucose-Capillary: 150 mg/dL — ABNORMAL HIGH (ref 70–99)

## 2024-02-20 LAB — MAGNESIUM: Magnesium: 2 mg/dL (ref 1.7–2.4)

## 2024-02-20 MED ORDER — POLYETHYLENE GLYCOL 3350 17 G PO PACK
17.0000 g | PACK | Freq: Every day | ORAL | Status: DC
  Administered 2024-02-21 – 2024-03-01 (×7): 17 g via ORAL
  Filled 2024-02-20 (×9): qty 1

## 2024-02-20 MED ORDER — SODIUM BICARBONATE 650 MG PO TABS
650.0000 mg | ORAL_TABLET | Freq: Two times a day (BID) | ORAL | Status: DC
  Administered 2024-02-20 – 2024-03-01 (×21): 650 mg via ORAL
  Filled 2024-02-20 (×21): qty 1

## 2024-02-20 MED ORDER — PIPERACILLIN-TAZOBACTAM 3.375 G IVPB
3.3750 g | Freq: Three times a day (TID) | INTRAVENOUS | Status: DC
  Administered 2024-02-20 – 2024-02-21 (×5): 3.375 g via INTRAVENOUS
  Filled 2024-02-20 (×4): qty 50

## 2024-02-20 NOTE — Plan of Care (Signed)
Problem: Education: Goal: Knowledge of General Education information will improve Description Including pain rating scale, medication(s)/side effects and non-pharmacologic comfort measures Outcome: Progressing   Problem: Coping: Goal: Level of anxiety will decrease Outcome: Progressing   Problem: Safety: Goal: Ability to remain free from injury will improve Outcome: Progressing   Problem: Coping: Goal: Ability to adjust to condition or change in health will improve Outcome: Progressing

## 2024-02-20 NOTE — TOC Progression Note (Signed)
 Transition of Care [facility]) - Progression Note    Patient Details  Name: Maria Pace MRN: [id number] Date of Birth: 09/19/1940  [facility]) CM/SW Contact  Lucie Lunger, [place] Phone Number: 02/20/2024, 10:32 AM  Clinical Narrative:    CSW updated by [place] with DSS that court hearing was this morning and pts sister has been named as guardian for pt at this time. She will send paperwork over to this CSW when it comes through. Musu states pts sister prefers placement in [place] county area if possible as it is closest to family. CSW sent referral out to additional facilities at this time. TOC to follow.   Expected Discharge Plan: Group Home Barriers to Discharge: Continued Medical Work up               Expected Discharge Plan and Services In-house Referral: Clinical Social Work Discharge Planning Services: CM Consult Post Acute Care Choice: Home Health Living arrangements for the past 2 months: Group Home                                       Social Drivers of Health (SDOH) Interventions SDOH Screenings   Food Insecurity: No Food Insecurity (02/16/2024)  Housing: Low Risk  (02/16/2024)  Transportation Needs: No Transportation Needs (02/16/2024)  Recent Concern: Transportation Needs - Unmet Transportation Needs (02/16/2024)  Utilities: Not At Risk (02/16/2024)  Depression (PHQ2-9): High Risk (09/06/2021)  Social Connections: Unknown (02/16/2024)  Tobacco Use: Low Risk  (02/17/2024)    Readmission Risk Interventions    02/18/2024   10:48 AM 02/17/2024   12:13 PM 07/14/2022   11:16 AM  Readmission Risk Prevention Plan  Transportation Screening Complete Complete Complete  PCP or Specialist Appt within 5-7 Days   Not Complete  PCP or Specialist Appt within 3-5 Days Complete    Home Care Screening   Complete  Medication Review (RN CM)   Complete  HRI or Home Care Consult Complete Complete   Social Work Consult for Recovery Care  Planning/Counseling Complete Complete   Palliative Care Screening Not Applicable Not Applicable   Medication Review Oceanographer) Complete Complete

## 2024-02-20 NOTE — Progress Notes (Signed)
 PROGRESS NOTE    Maria Pace  FMW:[id number] DOB: 11/20/1940 DOA: 02/16/2024 PCP: Wilmon Penton, NP   Brief Narrative:    Maria Pace  is a 83 y.o. female,    with medical history significant of bipolar 1 disorder, schizophrenia, essential hypertension, GERD, hyperlipidemia, history of irregular heartbeat, type 2 diabetes mellitus, PAF on apixaban , HFrEF (EF 30-35%), HTN, patient with hospitalization last July, due to acute anemia from GI bleed, for which she refused endoscopy/colonoscopy . - Was sent from her group home as she was found blood with large amount of diarrhea, dark stools, poor historian due to schizophrenia, she is answering question appropriately but cannot give exact details, she had anemia in July, refused GI evaluation, unfortunately there is no medication list by the patient, by by reviewing dispense record it does appear she is not anymore on her metoprolol  or Eliquis .  Patient has been admitted for stercoral colitis with manual disimpaction performed in the ED.  She remains on IV Zosyn  and has been seen by GI with plans to advance diet as tolerated as well as aggressive bowel regimen.  She has previously refused any endoscopy and plans are for supportive care as well as potential SNF on discharge.  Transferred to stepdown unit 11/23 for closer blood pressure monitoring with low readings noted this a.m. likely from inaccurate blood pressure readings.  Started on IV fluid for AKI.  She has been taken off of norepinephrine  early this morning and IV fluids have been held.  Assessment & Plan:   Principal Problem:   Stercoral colitis Active Problems:   Hypertension   Type 2 diabetes mellitus (HCC)   Severe dehydration   Schizophrenia (HCC)   GERD (gastroesophageal reflux disease)   PAF (paroxysmal atrial fibrillation) (HCC)   Chronic HFrEF (heart failure with reduced ejection fraction) (HCC)   Chronic constipation   Acute on chronic anemia  Assessment and  Plan:  Sepsis, POA Sterocoral colitis Hemoccult positive stool -Sepsis POA, febrile 1.4, with leukocytosis and tachypnea  -Occult positive stool, but hemoglobin is stable, had manual disimpaction in ED, impacted stool was brown in color, no melena or bright red blood per rectum, followed by diarrhea dark gray in color, no melena, Hemoccult positive stool most likely due to colitis, as well she is on iron  tablets. -Follow-up blood cultures with no growth at this time -Most likely in the setting of stercoral colitis -Continue with IV Zosyn . - Continue on laxative regimen - She is on aspirin , will hold - Appreciate GI recommendations to conservatively manage and follow CBC and continue PPI twice daily as well as laxation, GI has signed off -CTA GI bleeding protocol is negative -Appreciate palliative consultation for goals of care with guardianship pending.  Continue full scope of care for now per discussion.  TOC working on SNF placement. - Cortisol 14.5 and 2D echocardiogram with low EF, but similar to prior.   Chronic HFrEF - 09/26/2023 echo EF 30-35%, normal RVF, moderate MR - Patient with lower extremity edema -Given she presents with sepsis we will hold on diuresis -Pressure is soft we will hold on Entresto  -Does appear her metoprolol  has been discontinued due to soft blood pressure in the past -Resume Aldactone  once stable, continue to hold for now - Hold further IV fluid   Paroxysmal atrial fibrillation - Currently in sinus rhythm - Continue with amiodarone  -By reviewing records it does appear she has not been on Eliquis  for few months currently, and I would assume this is most likely  in the setting of her previous anemia due to GI bleed and refusing anemia workup as it does appear risks outweighed benefits anticoagulation   Schizophrenia, unspecified type - Continue olanzapine  - Continue Depakote  -Working on guardianship   AKI on CKD stage IIIb with hyponatremia and metabolic  acidosis -Likely in the setting of hypotension with creatinine elevation ongoing, but no oliguria - Hold further IV fluid and monitor -Avoid nephrotoxic agents -Strict I's and O's -Follow lab work -Plan to start bicarbonate orally today   Diabetes mellitus type 2, controlled - A1c, hold metformin and keep on insulin  sliding scale - NovoLog  sliding scale - Holding metformin   Mixed hyperlipidemia - Continue statin  Morbid obesity-class III BMI 41.89  DVT prophylaxis:SCDs Code Status: Full Family Communication: None at bedside Disposition Plan:  Status is: Inpatient Remains inpatient appropriate because: Need for IV medications.   Consultants:  GI signed off 11/25 Palliative  Procedures:  None  Antimicrobials:  Anti-infectives (From admission, onward)    Start     Dose/Rate Route Frequency Ordered Stop   02/20/24 0815  piperacillin -tazobactam (ZOSYN ) IVPB 3.375 g        3.375 g 12.5 mL/hr over 240 Minutes Intravenous Every 8 hours 02/20/24 0800     02/18/24 2200  piperacillin -tazobactam (ZOSYN ) IVPB 3.375 g  Status:  Discontinued        3.375 g 12.5 mL/hr over 240 Minutes Intravenous Every 12 hours 02/18/24 1352 02/20/24 0800   02/17/24 0400  piperacillin -tazobactam (ZOSYN ) IVPB 3.375 g  Status:  Discontinued        3.375 g 12.5 mL/hr over 240 Minutes Intravenous Every 8 hours 02/16/24 2126 02/18/24 1352   02/16/24 2000  piperacillin -tazobactam (ZOSYN ) IVPB 3.375 g        3.375 g 100 mL/hr over 30 Minutes Intravenous  Once 02/16/24 1949 02/17/24 1200       Subjective: Patient seen and evaluated today with no new acute complaints or concerns.  She denies any abdominal complaints and blood pressures appear to have improved.  Objective: Vitals:   02/20/24 0800 02/20/24 1002 02/20/24 1030 02/20/24 1100  BP: 108/79 120/74 112/78 121/84  Pulse:      Resp:      Temp: 98.6 F (37 C)     TempSrc:      SpO2:      Weight:      Height:        Intake/Output  Summary (Last 24 hours) at 02/20/2024 1211 Last data filed at 02/20/2024 1128 Gross per 24 hour  Intake 2498.64 ml  Output 3875 ml  Net -1376.36 ml   Filed Weights   02/16/24 2112 02/18/24 0758 02/19/24 0445  Weight: 97.3 kg 99.4 kg 100.9 kg    Examination:  General exam: Appears calm and comfortable, obese Respiratory system: Clear to auscultation. Respiratory effort normal.  Nasal cannula oxygen Cardiovascular system: S1 & S2 heard, RRR.  Gastrointestinal system: Abdomen is soft Central nervous system: Alert and awake Extremities: No edema Skin: No significant lesions noted Psychiatry: Flat affect.    Data Reviewed: I have personally reviewed following labs and imaging studies  CBC: Recent Labs  Lab 02/16/24 1610 02/16/24 2025 02/17/24 0445 02/18/24 0521 02/19/24 0435 02/20/24 0619  WBC 12.3* 11.5* 10.9* 10.3 11.0* 8.4  NEUTROABS 9.5*  --   --   --   --   --   HGB 12.1 10.8* 10.3* 9.5* 9.4* 10.2*  HCT 38.0 33.7* 32.1* 29.7* 28.9* 31.7*  MCV 92.5 92.8 92.5 93.1  91.2 93.2  PLT 219 202 187 183 202 197   Basic Metabolic Panel: Recent Labs  Lab 02/16/24 1610 02/17/24 0445 02/18/24 0521 02/19/24 0435 02/20/24 0619  NA 140 137 131* 132* 135  K 4.3 4.3 4.4 4.3 4.1  CL 101 102 96* 98 107  CO2 23 22 21* 21* 19*  GLUCOSE 119* 76 116* 86 101*  BUN 34* 35* 41* 40* 29*  CREATININE 1.66* 1.68* 2.55* 2.73* 1.68*  CALCIUM  8.8* 8.3* 8.1* 8.0* 8.2*  MG  --   --  1.7 2.1 2.0   GFR: Estimated Creatinine Clearance: 28.1 mL/min (A) (by C-G formula based on SCr of 1.68 mg/dL (H)). Liver Function Tests: Recent Labs  Lab 02/16/24 1610  AST 49*  ALT 33  ALKPHOS 88  BILITOT 0.4  PROT 7.0  ALBUMIN 3.6   No results for input(s): LIPASE, AMYLASE in the last 168 hours. No results for input(s): AMMONIA in the last 168 hours. Coagulation Profile: Recent Labs  Lab 02/16/24 1610  INR 1.0   Cardiac Enzymes: No results for input(s): CKTOTAL, CKMB, CKMBINDEX,  TROPONINI in the last 168 hours. BNP (last 3 results) No results for input(s): PROBNP in the last 8760 hours. HbA1C: No results for input(s): HGBA1C in the last 72 hours. CBG: Recent Labs  Lab 02/19/24 1144 02/19/24 1630 02/19/24 2129 02/20/24 0737 02/20/24 1121  GLUCAP 146* 96 166* 114* 124*   Lipid Profile: No results for input(s): CHOL, HDL, LDLCALC, TRIG, CHOLHDL, LDLDIRECT in the last 72 hours. Thyroid  Function Tests: No results for input(s): TSH, T4TOTAL, FREET4, T3FREE, THYROIDAB in the last 72 hours. Anemia Panel: No results for input(s): VITAMINB12, FOLATE, FERRITIN, TIBC, IRON , RETICCTPCT in the last 72 hours. Sepsis Labs: Recent Labs  Lab 02/16/24 2025 02/16/24 2327 02/18/24 0734 02/18/24 1022  LATICACIDVEN 3.1* 2.0* 1.6 1.8    Recent Results (from the past 240 hours)  Culture, blood (routine x 2)     Status: None (Preliminary result)   Collection Time: 02/16/24  8:25 PM   Specimen: BLOOD  Result Value Ref Range Status   Specimen Description BLOOD LEFT ANTECUBITAL  Final   Special Requests   Final    BOTTLES DRAWN AEROBIC ONLY Blood Culture adequate volume   Culture   Final    NO GROWTH 4 DAYS Performed at [facility], [address], [place], [place] [address]    Report Status PENDING  Incomplete  Culture, blood (routine x 2)     Status: None (Preliminary result)   Collection Time: 02/16/24  8:34 PM   Specimen: BLOOD  Result Value Ref Range Status   Specimen Description BLOOD RIGHT ANTECUBITAL  Final   Special Requests   Final    BOTTLES DRAWN AEROBIC AND ANAEROBIC Blood Culture adequate volume   Culture   Final    NO GROWTH 4 DAYS Performed at [facility], [address], [place], [place] [address]    Report Status PENDING  Incomplete  MRSA Next Gen by PCR, Nasal     Status: None   Collection Time: 02/18/24  8:10 AM   Specimen: Nasal Mucosa; Nasal Swab  Result Value Ref Range Status   MRSA by PCR Next Gen  NOT DETECTED NOT DETECTED Final    Comment: (NOTE) The GeneXpert MRSA Assay (FDA approved for NASAL specimens only), is one component of a comprehensive MRSA colonization surveillance program. It is not intended to diagnose MRSA infection nor to guide or monitor treatment for MRSA infections. Test performance is not  FDA approved in patients less than 76 years old. Performed at [facility], [address], [place], [place] [address]          Radiology Studies: ECHOCARDIOGRAM COMPLETE Result Date: 02/19/2024    ECHOCARDIOGRAM REPORT   Patient Name:   Maria Pace Date of Exam: 02/19/2024 Medical Rec #:  [id number]      Height:       60.0 in Accession #:    [id number]     Weight:       222.4 lb Date of Birth:  01-12-1941       BSA:          1.953 m Patient Age:    83 years       BP:           111/58 mmHg Patient Gender: F              HR:           99 bpm. Exam Location:  Inpatient Procedure: 2D Echo, Color Doppler, Cardiac Doppler and Intracardiac            Opacification Agent (Both Spectral and Color Flow Doppler were            utilized during procedure). Indications:     Other abnormalities of the heart R00.8  History:         Patient has prior history of Echocardiogram examinations, most                  recent 09/26/2023. Risk Factors:Hypertension, Dyslipidemia and                  Diabetes.  Sonographer:     Tinnie Gosling RDCS Referring Phys:  [id number] Kaitlinn Iversen D [facility] Diagnosing Phys: Diannah Late Mallipeddi IMPRESSIONS  1. No LV thrombus by Definity . Left ventricular ejection fraction, by estimation, is 30 to 35%. The left ventricle has moderately decreased function. The left ventricle demonstrates regional wall motion abnormalities (see scoring diagram/findings for description). Left ventricular diastolic parameters are indeterminate.  2. Right ventricular systolic function was not well visualized. The right ventricular size is mildly enlarged.  3. The mitral valve is grossly normal. Trivial  mitral valve regurgitation. No evidence of mitral stenosis.  4. The aortic valve was not well visualized. Aortic valve regurgitation is not visualized. Comparison(s): No significant change from prior study. FINDINGS  Left Ventricle: No LV thrombus by Definity . Left ventricular ejection fraction, by estimation, is 30 to 35%. The left ventricle has moderately decreased function. The left ventricle demonstrates regional wall motion abnormalities. Strain was performed and the global longitudinal strain is indeterminate. The left ventricular internal cavity size was normal in size. There is no left ventricular hypertrophy. Abnormal (paradoxical) septal motion, consistent with left bundle branch block. Left ventricular diastolic parameters are indeterminate.  LV Wall Scoring: The entire anterior wall and anterior septum are akinetic. The inferior septum, entire inferior wall, and posterior wall are hypokinetic. The apex is normal. Right Ventricle: The right ventricular size is mildly enlarged. No increase in right ventricular wall thickness. Right ventricular systolic function was not well visualized. Left Atrium: Left atrial size was normal in size. Right Atrium: Right atrial size was normal in size. Pericardium: There is no evidence of pericardial effusion. Mitral Valve: The mitral valve is grossly normal. Trivial mitral valve regurgitation. No evidence of mitral valve stenosis. Tricuspid Valve: The tricuspid valve is not well visualized. Tricuspid valve regurgitation is mild . No evidence of tricuspid  stenosis. Aortic Valve: The aortic valve was not well visualized. Aortic valve regurgitation is not visualized. Pulmonic Valve: The pulmonic valve was not well visualized. Pulmonic valve regurgitation is not visualized. No evidence of pulmonic stenosis. Aorta: The aortic root and ascending aorta are structurally normal, with no evidence of dilitation. Venous: The inferior vena cava was not well visualized. IAS/Shunts: The  interatrial septum was not well visualized. Additional Comments: 3D was performed not requiring image post processing on an independent workstation and was indeterminate.  LEFT VENTRICLE PLAX 2D LVIDd:         5.40 cm   Diastology LVIDs:         4.50 cm   LV e' medial:    4.46 cm/s LV PW:         1.10 cm   LV E/e' medial:  20.8 LV IVS:        1.10 cm   LV e' lateral:   8.81 cm/s LVOT diam:     1.90 cm   LV E/e' lateral: 10.5 LV SV:         40 LV SV Index:   20 LVOT Area:     2.84 cm LV IVRT:       98 msec  RIGHT VENTRICLE RV S prime:     12.90 cm/s  PULMONARY VEINS TAPSE (M-mode): 1.2 cm      Diastolic Velocity: 31.00 cm/s                             S/D Velocity:       0.90                             Systolic Velocity:  28.90 cm/s LEFT ATRIUM           Index LA diam:      4.80 cm 2.46 cm/m LA Vol (A4C): 38.7 ml 19.82 ml/m  AORTIC VALVE LVOT Vmax:   98.70 cm/s LVOT Vmean:  65.100 cm/s LVOT VTI:    0.141 m  AORTA Ao Root diam: 2.80 cm Ao Asc diam:  3.20 cm MITRAL VALVE               TRICUSPID VALVE MV Area (PHT): 5.79 cm    TR Peak grad:   17.6 mmHg MV Decel Time: 131 msec    TR Vmax:        210.00 cm/s MV E velocity: 92.90 cm/s MV A velocity: 33.80 cm/s  SHUNTS MV E/A ratio:  2.75        Systemic VTI:  0.14 m                            Systemic Diam: 1.90 cm Vishnu Priya Mallipeddi Electronically signed by Diannah Late Mallipeddi Signature Date/Time: 02/19/2024/3:27:47 PM    Final (Updated)          Scheduled Meds:  Chlorhexidine  Gluconate Cloth  6 each Topical Daily   divalproex   250 mg Oral Daily   insulin  aspart  0-9 Units Subcutaneous TID AC & HS   latanoprost   1 drop Both Eyes QHS   levothyroxine   50 mcg Oral Q0600   liver oil-zinc  oxide   Topical Q0600   midodrine   10 mg Oral TID WC   OLANZapine   20 mg Oral QHS   OLANZapine   5 mg Oral QHS  pantoprazole   40 mg Oral BID   polyethylene glycol  17 g Oral Daily   senna-docusate  1 tablet Oral BID   Continuous Infusions:  sodium chloride   75 mL/hr at 02/20/24 1128   norepinephrine  (LEVOPHED ) Adult infusion Stopped (02/20/24 0512)   piperacillin -tazobactam (ZOSYN )  IV 12.5 mL/hr at 02/20/24 1128     LOS: 4 days    Total care time: 55 minutes.    Seriyah Collison JONETTA Fairly, DO [facility]  If 7PM-7AM, please contact night-coverage [email or web address] 02/20/2024, 12:11 PM

## 2024-02-20 NOTE — Progress Notes (Signed)
 Physical Therapy Treatment Patient Details Name: Maria Pace MRN: [id number] DOB: September 12, 1940 Today's Date: 02/20/2024   History of Present Illness Maria Pace  is a 83 y.o. female,    with medical history significant of bipolar 1 disorder, schizophrenia, essential hypertension, GERD, hyperlipidemia, history of irregular heartbeat, type 2 diabetes mellitus, PAF on apixaban , HFrEF (EF 30-35%), HTN, patient with hospitalization last July, due to acute anemia from GI bleed, for which she refused endoscopy/colonoscopy .  - Was sent from her group home as she was found blood with large amount of diarrhea, dark stools, poor historian due to schizophrenia, she is answering question appropriately but cannot give exact details, she had anemia in July, refused GI evaluation, unfortunately there is no medication list by the patient, by by reviewing dispense record it does appear she is not anymore on her metoprolol  or Eliquis .  - In ED she had fever 101.4, CTA GI bleed protocol with no evidence of active extravasation, but was significant for sterile coral colitis, patient had manual disimpaction by ED physician, no stool in color, was followed by gray loose stool, no melena, but she was Hemoccult positive, her globin was stable, creatinine at baseline, [facility] consulted to admit    PT Comments  REASSESSMENT:  Patient was transferred to ICU due to low blood pressure and now stable to resume therapy. Patient demonstrates slow labored movement for sitting up at bedside with c/o increased pain over buttocks, limited to a few side steps before having to sit due to fatigue and weakness. Patient tolerated sitting up in chair after therapy - nursing staff aware. Patient will benefit from continued skilled physical therapy in hospital and recommended venue below to increase strength, balance, endurance for safe ADLs and gait.      If plan is discharge home, recommend the following: A lot of help with  bathing/dressing/bathroom;A lot of help with walking and/or transfers;Help with stairs or ramp for entrance;Assist for transportation;Assistance with cooking/housework   Can travel by private vehicle     No  Equipment Recommendations  None recommended by PT    Recommendations for Other Services       Precautions / Restrictions Precautions Precautions: Fall Recall of Precautions/Restrictions: Impaired Restrictions Weight Bearing Restrictions Per Provider Order: No     Mobility  Bed Mobility Overal bed mobility: Needs Assistance Bed Mobility: Supine to Sit     Supine to sit: Mod assist     General bed mobility comments: increased time, labored movement    Transfers Overall transfer level: Needs assistance Equipment used: Rolling walker (2 wheels) Transfers: Sit to/from Stand, Bed to chair/wheelchair/BSC Sit to Stand: Min assist, Mod assist   Step pivot transfers: Min assist, Mod assist       General transfer comment: slow labored movement    Ambulation/Gait Ambulation/Gait assistance: Mod assist, Min assist Gait Distance (Feet): 3 Feet Assistive device: Rolling walker (2 wheels) Gait Pattern/deviations: Decreased step length - right, Decreased step length - left, Decreased stride length, Trunk flexed Gait velocity: slow     General Gait Details: limited to a few slow labored side steps before requesting to sit due to fatigue and c/o weakness   Stairs             Wheelchair Mobility     Tilt Bed    Modified Rankin (Stroke Patients Only)       Balance Overall balance assessment: Needs assistance Sitting-balance support: Feet supported, No upper extremity supported Sitting balance-Leahy Scale: Fair Sitting balance -  Comments: seated at EOB   Standing balance support: Reliant on assistive device for balance, During functional activity, Bilateral upper extremity supported Standing balance-Leahy Scale: Poor Standing balance comment: using RW                             Communication Communication Communication: No apparent difficulties  Cognition Arousal: Alert Behavior During Therapy: WFL for tasks assessed/performed                           PT - Cognition Comments: requires frequent verbal/tactile cueing for following directions Following commands: Impaired Following commands impaired: Follows one step commands with increased time    Cueing Cueing Techniques: Verbal cues, Tactile cues  Exercises      General Comments        Pertinent Vitals/Pain Pain Assessment Pain Assessment: Faces Faces Pain Scale: Hurts a little bit Pain Location: over bottom Pain Descriptors / Indicators: Discomfort, Sore Pain Intervention(s): Limited activity within patient's tolerance, Monitored during session, Repositioned    Home Living                          Prior Function            PT Goals (current goals can now be found in the care plan section) Acute Rehab PT Goals Patient Stated Goal: Return to group home PT Goal Formulation: With patient Time For Goal Achievement: 03/05/24 Potential to Achieve Goals: Good Progress towards PT goals: Progressing toward goals    Frequency    Min 3X/week      PT Plan      Co-evaluation              AM-PAC PT 6 Clicks Mobility   Outcome Measure  Help needed turning from your back to your side while in a flat bed without using bedrails?: A Little Help needed moving from lying on your back to sitting on the side of a flat bed without using bedrails?: A Lot Help needed moving to and from a bed to a chair (including a wheelchair)?: A Lot Help needed standing up from a chair using your arms (e.g., wheelchair or bedside chair)?: A Little Help needed to walk in hospital room?: A Lot Help needed climbing 3-5 steps with a railing? : A Lot 6 Click Score: 14    End of Session   Activity Tolerance: Patient tolerated treatment well;Patient  limited by fatigue Patient left: in chair;with call bell/phone within reach Nurse Communication: Mobility status PT Visit Diagnosis: Unsteadiness on feet (R26.81);Other abnormalities of gait and mobility (R26.89);Muscle weakness (generalized) (M62.81)     Time: 9097-9072 PT Time Calculation (min) (ACUTE ONLY): 25 min  Charges:    $Therapeutic Activity: 23-37 mins PT General Charges $$ ACUTE PT VISIT: 1 Visit                     12:32 PM, 02/20/24 Lynwood Music, MPT Physical Therapist with [facility] [phone] office [phone] mobile phone

## 2024-02-20 NOTE — Progress Notes (Signed)
 PT Cancellation Note  Patient Details Name: Maria Pace MRN: [id number] DOB: [birth year]   Cancelled Treatment:    Reason Eval/Treat Not Completed: Medical issues which prohibited therapy. Patient transferred to a higher level of care and will need new PT consult to resume therapy when patient is medically stable.  Thank you.   7:46 AM, 02/20/24 Lynwood Music, MPT Physical Therapist with [facility] 336 [phone] office [phone] mobile phone

## 2024-02-20 NOTE — Progress Notes (Signed)
 Gastroenterology Progress Note   Patient ID: Maria Pace; [id number]; 1940/07/22    Subjective   Sitting up in chair. Nursing state no overt GI bleeding. Good appetite. Nursing states she asks for food each time they enter the room.    Objective   Vital signs in last 24 hours Temp:  [98.4 F (36.9 C)-99.3 F (37.4 C)] 98.6 F (37 C) (11/25 0800) Pulse Rate:  [84-107] 95 (11/25 0715) Resp:  [10-27] 19 (11/25 0730) BP: (84-131)/(41-85) 121/84 (11/25 1100) SpO2:  [95 %-100 %] 100 % (11/25 0715) Last BM Date : 02/20/24  Physical Exam General:   Alert and oriented, pleasant, sitting up in chair, oriented to person only Abdomen:  limited as patient sitting up in chair. Large AP diameter but soft, non-distended.    Intake/Output from previous day: 11/24 0701 - 11/25 0700 In: 1538.8 [P.O.:240; I.V.:1198.7; IV Piggyback:100] Out: 3375 [Urine:3375] Intake/Output this shift: Total I/O In: 1199.9 [P.O.:600; I.V.:557.2; IV Piggyback:42.7] Out: 800 [Urine:800]  Lab Results  Recent Labs    02/18/24 0521 02/19/24 0435 02/20/24 0619  WBC 10.3 11.0* 8.4  HGB 9.5* 9.4* 10.2*  HCT 29.7* 28.9* 31.7*  PLT 183 202 197   BMET Recent Labs    02/18/24 0521 02/19/24 0435 02/20/24 0619  NA 131* 132* 135  K 4.4 4.3 4.1  CL 96* 98 107  CO2 21* 21* 19*  GLUCOSE 116* 86 101*  BUN 41* 40* 29*  CREATININE 2.55* 2.73* 1.68*  CALCIUM  8.1* 8.0* 8.2*    Studies/Results ECHOCARDIOGRAM COMPLETE Result Date: 02/19/2024    ECHOCARDIOGRAM REPORT   Patient Name:   Maria Pace Date of Exam: 02/19/2024 Medical Rec #:  [id number]      Height:       60.0 in Accession #:    [id number]     Weight:       222.4 lb Date of Birth:  20-Mar-1941       BSA:          1.953 m Patient Age:    83 years       BP:           111/58 mmHg Patient Gender: F              HR:           99 bpm. Exam Location:  Inpatient Procedure: 2D Echo, Color Doppler, Cardiac Doppler and Intracardiac            Opacification  Agent (Both Spectral and Color Flow Doppler were            utilized during procedure). Indications:     Other abnormalities of the heart R00.8  History:         Patient has prior history of Echocardiogram examinations, most                  recent 09/26/2023. Risk Factors:Hypertension, Dyslipidemia and                  Diabetes.  Sonographer:     Tinnie Gosling RDCS Referring Phys:  [id number] PRATIK D [facility] Diagnosing Phys: Diannah Late Mallipeddi IMPRESSIONS  1. No LV thrombus by Definity . Left ventricular ejection fraction, by estimation, is 30 to 35%. The left ventricle has moderately decreased function. The left ventricle demonstrates regional wall motion abnormalities (see scoring diagram/findings for description). Left ventricular diastolic parameters are indeterminate.  2. Right ventricular systolic function was not well visualized. The right ventricular size is  mildly enlarged.  3. The mitral valve is grossly normal. Trivial mitral valve regurgitation. No evidence of mitral stenosis.  4. The aortic valve was not well visualized. Aortic valve regurgitation is not visualized. Comparison(s): No significant change from prior study. FINDINGS  Left Ventricle: No LV thrombus by Definity . Left ventricular ejection fraction, by estimation, is 30 to 35%. The left ventricle has moderately decreased function. The left ventricle demonstrates regional wall motion abnormalities. Strain was performed and the global longitudinal strain is indeterminate. The left ventricular internal cavity size was normal in size. There is no left ventricular hypertrophy. Abnormal (paradoxical) septal motion, consistent with left bundle branch block. Left ventricular diastolic parameters are indeterminate.  LV Wall Scoring: The entire anterior wall and anterior septum are akinetic. The inferior septum, entire inferior wall, and posterior wall are hypokinetic. The apex is normal. Right Ventricle: The right ventricular size is mildly enlarged. No  increase in right ventricular wall thickness. Right ventricular systolic function was not well visualized. Left Atrium: Left atrial size was normal in size. Right Atrium: Right atrial size was normal in size. Pericardium: There is no evidence of pericardial effusion. Mitral Valve: The mitral valve is grossly normal. Trivial mitral valve regurgitation. No evidence of mitral valve stenosis. Tricuspid Valve: The tricuspid valve is not well visualized. Tricuspid valve regurgitation is mild . No evidence of tricuspid stenosis. Aortic Valve: The aortic valve was not well visualized. Aortic valve regurgitation is not visualized. Pulmonic Valve: The pulmonic valve was not well visualized. Pulmonic valve regurgitation is not visualized. No evidence of pulmonic stenosis. Aorta: The aortic root and ascending aorta are structurally normal, with no evidence of dilitation. Venous: The inferior vena cava was not well visualized. IAS/Shunts: The interatrial septum was not well visualized. Additional Comments: 3D was performed not requiring image post processing on an independent workstation and was indeterminate.  LEFT VENTRICLE PLAX 2D LVIDd:         5.40 cm   Diastology LVIDs:         4.50 cm   LV e' medial:    4.46 cm/s LV PW:         1.10 cm   LV E/e' medial:  20.8 LV IVS:        1.10 cm   LV e' lateral:   8.81 cm/s LVOT diam:     1.90 cm   LV E/e' lateral: 10.5 LV SV:         40 LV SV Index:   20 LVOT Area:     2.84 cm LV IVRT:       98 msec  RIGHT VENTRICLE RV S prime:     12.90 cm/s  PULMONARY VEINS TAPSE (M-mode): 1.2 cm      Diastolic Velocity: 31.00 cm/s                             S/D Velocity:       0.90                             Systolic Velocity:  28.90 cm/s LEFT ATRIUM           Index LA diam:      4.80 cm 2.46 cm/m LA Vol (A4C): 38.7 ml 19.82 ml/m  AORTIC VALVE LVOT Vmax:   98.70 cm/s LVOT Vmean:  65.100 cm/s LVOT VTI:    0.141 m  AORTA Ao Root  diam: 2.80 cm Ao Asc diam:  3.20 cm MITRAL VALVE                TRICUSPID VALVE MV Area (PHT): 5.79 cm    TR Peak grad:   17.6 mmHg MV Decel Time: 131 msec    TR Vmax:        210.00 cm/s MV E velocity: 92.90 cm/s MV A velocity: 33.80 cm/s  SHUNTS MV E/A ratio:  2.75        Systemic VTI:  0.14 m                            Systemic Diam: 1.90 cm Vishnu Priya Mallipeddi Electronically signed by Diannah Late Mallipeddi Signature Date/Time: 02/19/2024/3:27:47 PM    Final (Updated)    CT ANGIO GI BLEED Result Date: 02/16/2024 EXAM: CTA ABDOMEN AND PELVIS WITH CONTRAST 02/16/2024 07:33:33 PM TECHNIQUE: CTA images of the abdomen and pelvis with intravenous contrast. Three-dimensional MIP/volume rendered formations were performed. Automated exposure control, iterative reconstruction, and/or weight based adjustment of the mA/kV was utilized to reduce the radiation dose to as low as reasonably achievable. 75 mL of iohexol  (OMNIPAQUE ) 350 MG/ML injection was administered. COMPARISON: 02/20/2017 CLINICAL HISTORY: fever. melena FINDINGS: VASCULATURE: GI BLEED: No active contrast extravasation to localize GI bleed. AORTA: Aortic atherosclerosis. No acute finding. No abdominal aortic aneurysm. No dissection. CELIAC TRUNK: No acute finding. No occlusion or significant stenosis. SUPERIOR MESENTERIC ARTERY: No acute finding. No occlusion or significant stenosis. INFERIOR MESENTERIC ARTERY: No acute finding. No occlusion or significant stenosis. RENAL ARTERIES: No acute finding. No occlusion or significant stenosis. ILIAC ARTERIES: No acute finding. No occlusion or significant stenosis. ABDOMEN/PELVIS: LOWER CHEST: Ground glass opacities in the right lower lobe could reflect atelectasis or infiltrates. Mild cardiomegaly. LIVER: The liver is unremarkable. GALLBLADDER AND BILE DUCTS: Layering high-density material within the gallbladder, presumably small stones or sludge. No biliary ductal dilatation. SPLEEN: The spleen is unremarkable. PANCREAS: The pancreas is unremarkable. ADRENAL GLANDS:  Bilateral adrenal glands demonstrate no acute abnormality. KIDNEYS, URETERS AND BLADDER: Foley catheter in the bladder, which is decompressed. No stones in the kidneys or ureters. No hydronephrosis. No perinephric or periureteral stranding. GI AND BOWEL: Large stool burden throughout the colon. Large stool burden in the rectosigmoid colon with wall thickening suggesting stercoral colitis. Stomach and duodenal sweep demonstrate no acute abnormality. There is no bowel obstruction. No abnormal bowel wall distension. REPRODUCTIVE: Reproductive organs are unremarkable. PERITONEUM AND RETROPERITONEUM: No ascites or free air. LYMPH NODES: No lymphadenopathy. BONES AND SOFT TISSUES: No acute abnormality of the bones. No acute soft tissue abnormality. IMPRESSION: 1. No active GI bleeding. 2. Large stool burden in the rectosigmoid colon with wall thickening, suggestive of stercoral colitis. 3. Right lower lobe ground-glass opacities, which may represent atelectasis or infiltrate. 4. Mild cardiomegaly. 5. Cholelithiasis Electronically signed by: Franky Crease MD 02/16/2024 07:39 PM EST RP Workstation: [id number]   DG Chest Portable 1 View Result Date: 02/16/2024 CLINICAL DATA:  Altered mental status.  Gastrointestinal bleeding. EXAM: PORTABLE CHEST 1 VIEW COMPARISON:  10/20/2023. FINDINGS: Very poor depth of inspiration. Stable enlarged cardiac silhouette. Clear lungs with normal vascularity. Diffuse osteopenia. Moderate right glenohumeral degenerative changes. IMPRESSION: 1. No acute abnormality. 2. Stable cardiomegaly. Electronically Signed   By: Elspeth Bathe M.D.   On: 02/16/2024 15:07    Assessment  83 y.o. female with a history of bipolar disorder, schizophrenia, GERD, dyslipidemia, paroxysmal atrial fibrillation (no longer on apixaban ),  HFrEF (EF 30-35%), HTN, prior GI bleed with symptomatic anemia in July 2025 requiring blood transfusion but declined procedures, now presenting from [facility]  this admission after found in large amount of black stool. Admitted with sepsis, findings of stercoral colitis on CTA,  and GI now following.   Sister has now become legal guardian as of today, per palliative. Appreciate palliative following.    Hx of GI Bleed now with heme positive stool, acute on chronic anemia: July 2025 on Eliquis  and now has been on hold, declining colonoscopy/EGD at that time. CTA this admission without active bleeding. Admitting Hgb 12.1>10.8>10.3>9.5>9.4> 10.2 this morning, no overt GI bleeding. In light of improvement, overall multimorbidities, recommend conservative management with follow-up as outpatient with plans for procedures if needed at that time. Patient has declined evaluation multiple times but is unable to make those decisions own own; now that sister is legal guardian, this can be discussed with her in future if need arises.     Stercoral colitis: s/p manual disimpaction in ED, on empiric antibiotics, lactulose  and miralax  discontinued earlier due to looser stools. Will resume Miralax  daily and titrate as appropriate. Need to ensure avoiding constipation in this setting.    Hypotension: previously on levo past 48 hours but on hold since 0500 today as MAP improved.     Plan / Recommendations  Continue PPI BID Follow H/H serially, can revisit at minimum EGD +/- colonoscopy in future if further decline Continue Senna, add Miralax  back, titrate to avoid constipation Continue soft diet We will sign off and arrange outpatient follow-up: please reach out if any recurrent anemia or overt GI bleeding.  Appreciate palliative following    LOS: 4 days    02/20/2024, 11:42 AM  Therisa MICAEL Stager, PhD, ANP-BC [facility] Gastroenterology

## 2024-02-20 NOTE — Progress Notes (Signed)
 Palliative:  HPI: 83 y.o. female  with past medical history of schizophrenia, bipolar 1, GIB, afib (no anticoagulation d/t GIB), HFrEF, HTN, HLD, DM2, GERD admitted on 02/16/2024 from group home with large amount of diarrhea with concern for blood.   I met with Ms. Notz but no family at bedside. Discussed with RN. Ms. Martone is up in recliner. Her main complaint is discomfort from her foley catheter. She has more energy today - more feisty and animated.   I returned to bedside to discuss further with sister who was named legal guardian this morning. We further discussed Ms. Gettinger's current health with signs of improvement in acute health issues and hopes she will be more cooperative to work with therapy - Ms. Barth is willing in conversation today and says she wants to get stronger. I did tell her this will take a lot of work from her and she agrees she is willing to try.   I spoke more with them about chronic illness and risk of further decline and complications in the future. We discussed code status and expectations if Ms. Manville's health declined to the extent of requiring resuscitation this would be very poor prognosis. Sister shares she does not know what Ms. Schimek would think about this. I asked Ms. Frank if her health became much worse to the point of her heart and lungs stopping and she died if she would want us  to do CPR and put her on a breathing machine or life support. She is nodding her head no as I am discussing indicating she would not want these measures. She then begins speaking on a different topic. Sister shares that she does not believe that she understands. I explained that it is difficult to know what much she is understanding at any time. However, this is something that is important to discuss before we are in a position we do not want to be in. I explained that ultimately this decision is up to her since she is now guardian. Sister shares that she will discuss with her family and that she  has 2 nieces that are nurses she can discuss with as well. I reassured her that Ms. Lumbra does seem to be improving but we just want to be prepared for anything. Sister verbalizes understanding. I provided her with Hard Choices booklet as well.   All questions/concerns addressed. Emotional support provided. Updated RN, Dr. Maree, and CSW. I also discussed with Ms. Shirlean NP GI.   Exam: Alert but with some confusion and poor insight and judgement. No distress. Sitting in chair. Breathing regular, unlabored. Abd soft. Generalized weakness.   Plan: - Full code - family having ongoing conversations - Full scope at this time  50 min  [name], NP Palliative Medicine Team Pager [phone] (Please see [email or web address] for schedule) Team Phone [phone]

## 2024-02-21 ENCOUNTER — Encounter (HOSPITAL_COMMUNITY): Payer: Self-pay | Admitting: Internal Medicine

## 2024-02-21 ENCOUNTER — Inpatient Hospital Stay (HOSPITAL_COMMUNITY): Admitting: Certified Registered"

## 2024-02-21 ENCOUNTER — Encounter (HOSPITAL_COMMUNITY): Admission: EM | Disposition: A | Payer: Self-pay | Source: Home / Self Care | Attending: Family Medicine

## 2024-02-21 DIAGNOSIS — I5022 Chronic systolic (congestive) heart failure: Secondary | ICD-10-CM | POA: Diagnosis not present

## 2024-02-21 DIAGNOSIS — Z7189 Other specified counseling: Secondary | ICD-10-CM | POA: Diagnosis not present

## 2024-02-21 DIAGNOSIS — K5289 Other specified noninfective gastroenteritis and colitis: Secondary | ICD-10-CM | POA: Diagnosis not present

## 2024-02-21 DIAGNOSIS — D649 Anemia, unspecified: Secondary | ICD-10-CM | POA: Diagnosis not present

## 2024-02-21 DIAGNOSIS — Z515 Encounter for palliative care: Secondary | ICD-10-CM | POA: Diagnosis not present

## 2024-02-21 LAB — CBC
HCT: 29 % — ABNORMAL LOW (ref 36.0–46.0)
Hemoglobin: 9.3 g/dL — ABNORMAL LOW (ref 12.0–15.0)
MCH: 29.2 pg (ref 26.0–34.0)
MCHC: 32.1 g/dL (ref 30.0–36.0)
MCV: 91.2 fL (ref 80.0–100.0)
Platelets: 199 K/uL (ref 150–400)
RBC: 3.18 MIL/uL — ABNORMAL LOW (ref 3.87–5.11)
RDW: 15.5 % (ref 11.5–15.5)
WBC: 5.3 K/uL (ref 4.0–10.5)
nRBC: 0 % (ref 0.0–0.2)

## 2024-02-21 LAB — BASIC METABOLIC PANEL WITH GFR
Anion gap: 9 (ref 5–15)
BUN: 17 mg/dL (ref 8–23)
CO2: 22 mmol/L (ref 22–32)
Calcium: 8.4 mg/dL — ABNORMAL LOW (ref 8.9–10.3)
Chloride: 106 mmol/L (ref 98–111)
Creatinine, Ser: 1.25 mg/dL — ABNORMAL HIGH (ref 0.44–1.00)
GFR, Estimated: 43 mL/min — ABNORMAL LOW (ref >60)
Glucose, Bld: 73 mg/dL (ref 70–99)
Potassium: 4.2 mmol/L (ref 3.5–5.1)
Sodium: 137 mmol/L (ref 135–145)

## 2024-02-21 LAB — GLUCOSE, CAPILLARY
Glucose-Capillary: 99 mg/dL (ref 70–99)
Glucose-Capillary: 99 mg/dL (ref 70–99)
Glucose-Capillary: 107 mg/dL — ABNORMAL HIGH (ref 70–99)
Glucose-Capillary: 168 mg/dL — ABNORMAL HIGH (ref 70–99)
Glucose-Capillary: 108 mg/dL — ABNORMAL HIGH (ref 70–99)

## 2024-02-21 LAB — CULTURE, BLOOD (ROUTINE X 2)
Culture: NO GROWTH
Culture: NO GROWTH
Special Requests: ADEQUATE
Special Requests: ADEQUATE

## 2024-02-21 LAB — MAGNESIUM: Magnesium: 1.7 mg/dL (ref 1.7–2.4)

## 2024-02-21 SURGERY — CANCELLED PROCEDURE

## 2024-02-21 MED ORDER — METOPROLOL SUCCINATE ER 25 MG PO TB24
12.5000 mg | ORAL_TABLET | Freq: Two times a day (BID) | ORAL | Status: DC
  Administered 2024-02-22 – 2024-03-01 (×13): 12.5 mg via ORAL
  Filled 2024-02-21 (×16): qty 1

## 2024-02-21 MED ORDER — MAGNESIUM OXIDE -MG SUPPLEMENT 400 (240 MG) MG PO TABS
400.0000 mg | ORAL_TABLET | Freq: Two times a day (BID) | ORAL | Status: AC
  Administered 2024-02-21 – 2024-02-23 (×6): 400 mg via ORAL
  Filled 2024-02-21 (×6): qty 1

## 2024-02-21 MED ORDER — TAB-A-VITE/IRON PO TABS
1.0000 | ORAL_TABLET | Freq: Every day | ORAL | Status: DC
  Administered 2024-02-21 – 2024-03-01 (×10): 1 via ORAL
  Filled 2024-02-21 (×12): qty 1

## 2024-02-21 MED ORDER — AMOXICILLIN-POT CLAVULANATE 875-125 MG PO TABS
1.0000 | ORAL_TABLET | Freq: Two times a day (BID) | ORAL | Status: AC
  Administered 2024-02-21 – 2024-02-25 (×9): 1 via ORAL
  Filled 2024-02-21 (×9): qty 1

## 2024-02-21 MED ORDER — AMIODARONE HCL 200 MG PO TABS
200.0000 mg | ORAL_TABLET | Freq: Every day | ORAL | Status: DC
  Administered 2024-02-21 – 2024-03-01 (×10): 200 mg via ORAL
  Filled 2024-02-21 (×10): qty 1

## 2024-02-21 MED ORDER — SODIUM CHLORIDE 0.9 % IV SOLN: INTRAVENOUS | Status: DC

## 2024-02-21 MED ORDER — TAMSULOSIN HCL 0.4 MG PO CAPS
0.4000 mg | ORAL_CAPSULE | Freq: Every day | ORAL | Status: DC
  Administered 2024-02-21 – 2024-02-29 (×9): 0.4 mg via ORAL
  Filled 2024-02-21 (×9): qty 1

## 2024-02-21 MED ORDER — ALPRAZOLAM 0.5 MG PO TABS
0.5000 mg | ORAL_TABLET | Freq: Once | ORAL | Status: AC
  Administered 2024-02-21: 0.5 mg via ORAL
  Filled 2024-02-21: qty 1

## 2024-02-21 NOTE — Progress Notes (Addendum)
 We will proceed with EGD as scheduled.  I thoroughly discussed with the patient and guardian the procedure, including the risks involved. Patient understands what the procedure involves including the benefits and any risks. Patient and guardian understands alternatives to the proposed procedure. Risks including (but not limited to) bleeding, tearing of the lining (perforation), rupture of adjacent organs, problems with heart and lung function, infection, and medication reactions. A small percentage of complications may require surgery, hospitalization, repeat endoscopic procedure, and/or transfusion.  Patient and guardian understood and agreed.  Toribio Fortune, MD Gastroenterology and Hepatology [facility] Gastroenterology

## 2024-02-21 NOTE — TOC Progression Note (Signed)
 Transition of Care [facility]) - Progression Note    Patient Details  Name: Maria Pace MRN: [id number] Date of Birth: Jan 22, 1941  [facility] of [facility]) CM/SW Contact  Lucie Lunger, [place] Phone Number: 02/21/2024, 12:48 PM  Clinical Narrative:    CSW notes there are still no SNF bed offers at this time. There are a few facilities that state considering pending bed availability. CSW reached out to [facility], [facility] and [facility] to see if they can review as pts LG prefers she go to SNF in [place]. CSW awaiting return calls from facilities. TOC to follow.   Expected Discharge Plan: Group Home Barriers to Discharge: Continued Medical Work up               Expected Discharge Plan and Services In-house Referral: Clinical Social Work Discharge Planning Services: CM Consult Post Acute Care Choice: Home Health Living arrangements for the past 2 months: Group Home                                       Social Drivers of Health (SDOH) Interventions SDOH Screenings   Food Insecurity: No Food Insecurity (02/16/2024)  Housing: Low Risk  (02/16/2024)  Transportation Needs: No Transportation Needs (02/16/2024)  Recent Concern: Transportation Needs - Unmet Transportation Needs (02/16/2024)  Utilities: Not At Risk (02/16/2024)  Depression (PHQ2-9): High Risk (09/06/2021)  Social Connections: Unknown (02/16/2024)  Tobacco Use: Low Risk  (02/21/2024)    Readmission Risk Interventions    02/18/2024   10:48 AM 02/17/2024   12:13 PM 07/14/2022   11:16 AM  Readmission Risk Prevention Plan  Transportation Screening Complete Complete Complete  PCP or Specialist Appt within 5-7 Days   Not Complete  PCP or Specialist Appt within 3-5 Days Complete    Home Care Screening   Complete  Medication Review (RN CM)   Complete  HRI or Home Care Consult Complete Complete   Social Work Consult for Recovery Care Planning/Counseling Complete Complete   Palliative  Care Screening Not Applicable Not Applicable   Medication Review Oceanographer) Complete Complete

## 2024-02-21 NOTE — Progress Notes (Addendum)
 Heart Failure Navigator Progress Note  Assessed for Heart & Vascular TOC clinic readiness.  Heart Failure Navigator reached out to staff nurse Roscoe Gosling, RN ) for appropriateness of providing patient with Heart Failure Education remotely.   She doesn't feel as if that is appropriate at this time.  Attempted to call her legal guardian to speak with her and no answer at this time.  Patient lives in a group home prior to this hospitalization.  Navigator available for reassessment of patient.   Charmaine Pines, RN, BSN [facility] Heart Failure Navigator Secure Chat Only

## 2024-02-21 NOTE — Progress Notes (Signed)
 Palliative:  HPI: 83 y.o. female  with past medical history of schizophrenia, bipolar 1, GIB, afib (no anticoagulation d/t GIB), HFrEF, HTN, HLD, DM2, GERD admitted on 02/16/2024 from group home with large amount of diarrhea with concern for blood.   Ms. Eye has gone for endoscopy. I returned later to note that endoscopy was cancelled due to risks outweighing benefits due to underlying pulmonary hypertension and heart failure.   I discussed with Ms. Conrey. She is in good spirits. She tells me she went for procedure but they couldn't find anything. I reminded her of concern for blood in stool and the procedure was to look for source of bleeding, however, they felt it was too risky to do the procedure due to her weak heart. She nods her head. She didn't want the procedure anyway. She continues to be more motivated to work with therapy. I tell her that she is too weak and now there is a lot of work she needs to do to get stronger. She tells me I don't want to do it but I have too. Emotional support provided.   No family at bedside. I did discuss with sister yesterday concerns for bleeding and worsening heart disease. I shared with her that there are many risks for further decline and we need to be prepared on how to handle these situations. She plans to speak more with their other siblings as well as nieces who are nurses tomorrow at their family gathering for Thanksgiving. She was hopeful for rehab placement and ultimately return to group home. Provided her with Hard Choices booklet yesterday. Needs ongoing discussion.   Exam: Alert but with poor recallt. Good spirits - smiling. No distress. Sitting in chair. Breathing regular, unlabored. Abd soft. Generalized weakness.    Plan: - Full code - family having ongoing conversations - Full scope at this time per sister who is guardian   37 min  [name], NP Palliative Medicine Team Pager [phone] (Please see [email or web address] for schedule) Team  Phone [phone]

## 2024-02-21 NOTE — Progress Notes (Signed)
 PROGRESS NOTE  Maria Pace, is a 83 y.o. female, DOB - 06-02-1940, FMW:[id number]  Admit date - 02/16/2024   Admitting Physician Brayton GORMAN Lye, MD  Outpatient Primary MD for the patient is Gammon, Chrystal, NP  LOS - 5  Brief Narrative:    83 y.o. female,    with medical history significant of bipolar 1 disorder, schizophrenia, essential hyprtension, GERD, hyperlipidemia, history of irregular heartbeat, type 2 diabetes mellitus, PAF on apixaban , HFrEF (EF 30-35%), HTN, patient with hospitalization last July, due to acute anemia from GI bleed, for which she refused endoscopy/colonoscopy . - Was sent from her group home as she was found blood with large amount of diarrhea, dark stools, poor historian due to schizophrenia, she is answering question appropriately but cannot give exact details, she had anemia in July, refused GI evaluation, unfortunately there is no medication list by the patient, by by reviewing dispense record it does appear she is not anymore on her metoprolol  or Eliquis .   Patient has been admitted for stercoral colitis with manual disimpaction performed in the ED.  She remains on IV Zosyn  and has been seen by GI with plans to advance diet as tolerated as well as aggressive bowel regimen.  She has previously refused any endoscopy and plans are for supportive care as well as potential SNF on discharge.  Transferred to stepdown unit 11/23 for closer blood pressure monitoring with low readings noted this a.m. likely from inaccurate blood pressure readings.  Started on IV fluid for AKI.  She has been taken off of norepinephrine  early this morning and IV fluids have been held.    -Assessment and Plan: Sepsis, POA due to Sterocoral colitis Hemoccult positive stool -Occult positive stool, but hemoglobin is stable, had manual disimpaction in ED, impacted stool was brown in color, no melena or bright red blood per rectum, followed by diarrhea dark gray in color (is on iron  tablets),  no melena, Hemoccult positive stool most likely due to colitis and rectal trauma from manual disimpaction. -Blood cultures NGTD - treated with IV Zosyn --overall much improved WBC is down to 5.3 from 11.0, okay to transition to oral Augmentin  on 02/22/2024 - Continue on laxative regimen - She is on aspirin , will hold - Appreciate GI recommendations to conservatively manage and follow CBC and continue PPI twice daily as well as laxatives -CTA GI bleeding protocol is negative -Anesthesia and GI team reluctant to do colonoscopy due to concerns about cardiovascular status with low EF -Appreciate palliative consultation for goals of care with guardianship pending.  Continue full scope of care for now per discussion.  TOC working on SNF placement. - Cortisol 14.5 and 2D echocardiogram with low EF, but similar to prior. - Patient has been weaned off Levophed  as early hours of of 02/20/2024   Acute on chronic Anemia--Hgb down to 9.3 from 10.3 on 02/17/2024 -No obvious bleeding at this time -GI team unable to perform endoscopy due to concerns about cardiovascular status -Underlying chronic anemia may be partly due to CKD   Chronic HFrEF - 09/26/2023 echo EF 30-35%, normal RVF, moderate MR Repeat Echo on 02/19/24--- EF is still 30 to 35%,  -left Ventricle: No LV thrombus by Definity . Left ventricular ejection fraction, by estimation, is 30 to 35%. The left ventricle has moderately decreased function. The left ventricle demonstrates regional wall motion abnormalities.  There is no left ventricular hypertrophy. Abnormal (paradoxical) septal motion, consistent with left bundle branch block.   LV Wall Scoring:  The entire anterior wall and  anterior septum are akinetic. The inferior  septum, entire inferior wall, and posterior wall are hypokinetic. The apex  is normal.  - Patient with lower extremity edema -Given she presents with sepsis we will hold on diuresis -Pressure is soft we will hold on Entresto   and Aldactone  -Okay to restart Toprol -XL at 12.5 mg twice daily-monitor BP   Paroxysmal atrial fibrillation -- Continue with amiodarone  -By reviewing records it does appear she has not been on Eliquis  for few months currently, and I would assume this is most likely in the setting of her previous anemia due to GI bleed and refusing anemia workup as it does appear risks outweighed benefits anticoagulation -Okay to restart Toprol -XL at 12.5 mg twice daily-monitor BP   Schizophrenia, unspecified type - Behavioral issues improving -- continue olanzapine  - Continue Depakote  -Working on guardianship   AKI on CKD stage IIIb with hyponatremia and metabolic acidosis -Likely in the setting of hypotension  - Creatinine is down to 1.25 from 2.73 on 02/19/2024 -- renally adjust medications, avoid nephrotoxic agents / dehydration  / hypotension    Diabetes mellitus type 2, controlled - A1c--5.2  -hold metformin  Use Novolog /Humalog Sliding scale insulin  with Accu-Cheks/Fingersticks as ordered    Mixed hyperlipidemia - Continue statin   Morbid obesity-class III BMI 41.89  Hypothyroidism-recheck TSH , continue levothyroxine   Disposition--patient originally from group home, however due to lack of cooperation with care and requiring higher level of care group home unable to take her back - Plan is for placement to SNF facility, unless patient improves enough to meet group home level of care -- Social/ethics--palliative care consult appreciated remains a full code without limitations to treatment - Guardianship initiated  -Acute urinary retention--Foley removed a.m. of 02/21/2024 - Awaiting for patient to void -Flomax  as ordered - Bladder scan just over 300 mL of urine at this time  Status is: Inpatient   Disposition: The patient is from: Group home              Anticipated d/c is to: SNF              Anticipated d/c date is: 1 day              Patient currently is not medically stable to  d/c. Barriers: Not Clinically Stable-   Code Status :  -  Code Status: Full Code   Family Communication:    Sister [name] Baptist [phone] --is primary contact DVT Prophylaxis  :   - SCDs  SCDs Start: 02/16/24 2111   Lab Results  Component Value Date   PLT 199 02/21/2024    Inpatient Medications  Scheduled Meds:  Chlorhexidine  Gluconate Cloth  6 each Topical Daily   divalproex   250 mg Oral Daily   insulin  aspart  0-9 Units Subcutaneous TID AC & HS   latanoprost   1 drop Both Eyes QHS   levothyroxine   50 mcg Oral Q0600   liver oil-zinc  oxide   Topical Q0600   magnesium  oxide  400 mg Oral BID   midodrine   10 mg Oral TID WC   OLANZapine   20 mg Oral QHS   OLANZapine   5 mg Oral QHS   pantoprazole   40 mg Oral BID   polyethylene glycol  17 g Oral Daily   senna-docusate  1 tablet Oral BID   sodium bicarbonate   650 mg Oral BID   Continuous Infusions:  norepinephrine  (LEVOPHED ) Adult infusion Stopped (02/20/24 0512)   piperacillin -tazobactam (ZOSYN )  IV 3.375 g (02/21/24 0105)  PRN Meds:.acetaminophen  **OR** acetaminophen , albuterol , hydrALAZINE , hydrALAZINE , HYDROcodone -acetaminophen , ondansetron  **OR** ondansetron  (ZOFRAN ) IV, mouth rinse, polyethylene glycol   Anti-infectives (From admission, onward)    Start     Dose/Rate Route Frequency Ordered Stop   02/20/24 0815  piperacillin -tazobactam (ZOSYN ) IVPB 3.375 g        3.375 g 12.5 mL/hr over 240 Minutes Intravenous Every 8 hours 02/20/24 0800     02/18/24 2200  piperacillin -tazobactam (ZOSYN ) IVPB 3.375 g  Status:  Discontinued        3.375 g 12.5 mL/hr over 240 Minutes Intravenous Every 12 hours 02/18/24 1352 02/20/24 0800   02/17/24 0400  piperacillin -tazobactam (ZOSYN ) IVPB 3.375 g  Status:  Discontinued        3.375 g 12.5 mL/hr over 240 Minutes Intravenous Every 8 hours 02/16/24 2126 02/18/24 1352   02/16/24 2000  piperacillin -tazobactam (ZOSYN ) IVPB 3.375 g        3.375 g 100 mL/hr over 30 Minutes Intravenous   Once 02/16/24 1949 02/17/24 1200         Subjective: Maria Pace today has no fevers, no emesis,  No chest pain,    - Concerns about possible urinary retention - Occasional episodes of restlessness   Objective: Vitals:   02/21/24 0300 02/21/24 0400 02/21/24 0700 02/21/24 0735  BP: (!) 105/57 111/60  131/89  Pulse: (!) 102 (!) 105 81 (!) 108  Resp: 14 16 17 17   Temp: 99 F (37.2 C) 98.8 F (37.1 C) 98.8 F (37.1 C) 98.8 F (37.1 C)  TempSrc:      SpO2: 95% 95% 93% 96%  Weight:      Height:        Intake/Output Summary (Last 24 hours) at 02/21/2024 0752 Last data filed at 02/21/2024 0511 Gross per 24 hour  Intake 1315.31 ml  Output 3250 ml  Net -1934.69 ml   Filed Weights   02/16/24 2112 02/18/24 0758 02/19/24 0445  Weight: 97.3 kg 99.4 kg 100.9 kg    Physical Exam  Gen:- Awake Alert, no acute distress HEENT:- [place].AT, No sclera icterus Neck-Supple Neck,No JVD,.  Lungs-  CTAB , fair symmetrical air movement CV- S1, S2 normal, irregular  Abd-  +ve B.Sounds, Abd Soft, No tenderness,    Extremity/Skin:-Improved edema, pedal pulses present  Psych-affect is appropriate, oriented x3 Neuro-no new focal deficits, no tremors  Data Reviewed: I have personally reviewed following labs and imaging studies  CBC: Recent Labs  Lab 02/16/24 1610 02/16/24 2025 02/17/24 0445 02/18/24 0521 02/19/24 0435 02/20/24 0619 02/21/24 0527  WBC 12.3*   < > 10.9* 10.3 11.0* 8.4 5.3  NEUTROABS 9.5*  --   --   --   --   --   --   HGB 12.1   < > 10.3* 9.5* 9.4* 10.2* 9.3*  HCT 38.0   < > 32.1* 29.7* 28.9* 31.7* 29.0*  MCV 92.5   < > 92.5 93.1 91.2 93.2 91.2  PLT 219   < > 187 183 202 197 199   < > = values in this interval not displayed.   Basic Metabolic Panel: Recent Labs  Lab 02/17/24 0445 02/18/24 0521 02/19/24 0435 02/20/24 0619 02/21/24 0527  NA 137 131* 132* 135 137  K 4.3 4.4 4.3 4.1 4.2  CL 102 96* 98 107 106  CO2 22 21* 21* 19* 22  GLUCOSE 76 116* 86 101*  73  BUN 35* 41* 40* 29* 17  CREATININE 1.68* 2.55* 2.73* 1.68* 1.25*  CALCIUM  8.3* 8.1* 8.0* 8.2*  8.4*  MG  --  1.7 2.1 2.0 1.7   GFR: Estimated Creatinine Clearance: 37.7 mL/min (A) (by C-G formula based on SCr of 1.25 mg/dL (H)). Liver Function Tests: Recent Labs  Lab 02/16/24 1610  AST 49*  ALT 33  ALKPHOS 88  BILITOT 0.4  PROT 7.0  ALBUMIN 3.6   Recent Results (from the past 240 hours)  Culture, blood (routine x 2)     Status: None   Collection Time: 02/16/24  8:25 PM   Specimen: BLOOD  Result Value Ref Range Status   Specimen Description BLOOD LEFT ANTECUBITAL  Final   Special Requests   Final    BOTTLES DRAWN AEROBIC ONLY Blood Culture adequate volume   Culture   Final    NO GROWTH 5 DAYS Performed at [facility], [address] [place], [place] [address]    Report Status 02/21/2024 FINAL  Final  Culture, blood (routine x 2)     Status: None   Collection Time: 02/16/24  8:34 PM   Specimen: BLOOD  Result Value Ref Range Status   Specimen Description BLOOD RIGHT ANTECUBITAL  Final   Special Requests   Final    BOTTLES DRAWN AEROBIC AND ANAEROBIC Blood Culture adequate volume   Culture   Final    NO GROWTH 5 DAYS Performed at [facility], [address], [place], [place] [address]    Report Status 02/21/2024 FINAL  Final  MRSA Next Gen by PCR, Nasal     Status: None   Collection Time: 02/18/24  8:10 AM   Specimen: Nasal Mucosa; Nasal Swab  Result Value Ref Range Status   MRSA by PCR Next Gen NOT DETECTED NOT DETECTED Final    Comment: (NOTE) The GeneXpert MRSA Assay (FDA approved for NASAL specimens only), is one component of a comprehensive MRSA colonization surveillance program. It is not intended to diagnose MRSA infection nor to guide or monitor treatment for MRSA infections. Test performance is not FDA approved in patients less than 44 years old. Performed at [facility], [address] [place], [place] [address]     Radiology  Studies: ECHOCARDIOGRAM COMPLETE Result Date: 02/19/2024    ECHOCARDIOGRAM REPORT   Patient Name:   Maria Pace Date of Exam: 02/19/2024 Medical Rec #:  [id number]      Height:       60.0 in Accession #:    [id number]     Weight:       222.4 lb Date of Birth:  [birth year]       BSA:          1.953 m Patient Age:    83 years       BP:           111/58 mmHg Patient Gender: F              HR:           99 bpm. Exam Location:  Inpatient Procedure: 2D Echo, Color Doppler, Cardiac Doppler and Intracardiac            Opacification Agent (Both Spectral and Color Flow Doppler were            utilized during procedure). Indications:     Other abnormalities of the heart R00.8  History:         Patient has prior history of Echocardiogram examinations, most                  recent 09/26/2023. Risk  Factors:Hypertension, Dyslipidemia and                  Diabetes.  Sonographer:     Tinnie Gosling RDCS Referring Phys:  [id number] PRATIK D [facility] Diagnosing Phys: Diannah Late Mallipeddi IMPRESSIONS  1. No LV thrombus by Definity . Left ventricular ejection fraction, by estimation, is 30 to 35%. The left ventricle has moderately decreased function. The left ventricle demonstrates regional wall motion abnormalities (see scoring diagram/findings for description). Left ventricular diastolic parameters are indeterminate.  2. Right ventricular systolic function was not well visualized. The right ventricular size is mildly enlarged.  3. The mitral valve is grossly normal. Trivial mitral valve regurgitation. No evidence of mitral stenosis.  4. The aortic valve was not well visualized. Aortic valve regurgitation is not visualized. Comparison(s): No significant change from prior study. FINDINGS  Left Ventricle: No LV thrombus by Definity . Left ventricular ejection fraction, by estimation, is 30 to 35%. The left ventricle has moderately decreased function. The left ventricle demonstrates regional wall motion abnormalities. Strain was performed and  the global longitudinal strain is indeterminate. The left ventricular internal cavity size was normal in size. There is no left ventricular hypertrophy. Abnormal (paradoxical) septal motion, consistent with left bundle branch block. Left ventricular diastolic parameters are indeterminate.  LV Wall Scoring: The entire anterior wall and anterior septum are akinetic. The inferior septum, entire inferior wall, and posterior wall are hypokinetic. The apex is normal. Right Ventricle: The right ventricular size is mildly enlarged. No increase in right ventricular wall thickness. Right ventricular systolic function was not well visualized. Left Atrium: Left atrial size was normal in size. Right Atrium: Right atrial size was normal in size. Pericardium: There is no evidence of pericardial effusion. Mitral Valve: The mitral valve is grossly normal. Trivial mitral valve regurgitation. No evidence of mitral valve stenosis. Tricuspid Valve: The tricuspid valve is not well visualized. Tricuspid valve regurgitation is mild . No evidence of tricuspid stenosis. Aortic Valve: The aortic valve was not well visualized. Aortic valve regurgitation is not visualized. Pulmonic Valve: The pulmonic valve was not well visualized. Pulmonic valve regurgitation is not visualized. No evidence of pulmonic stenosis. Aorta: The aortic root and ascending aorta are structurally normal, with no evidence of dilitation. Venous: The inferior vena cava was not well visualized. IAS/Shunts: The interatrial septum was not well visualized. Additional Comments: 3D was performed not requiring image post processing on an independent workstation and was indeterminate.  LEFT VENTRICLE PLAX 2D LVIDd:         5.40 cm   Diastology LVIDs:         4.50 cm   LV e' medial:    4.46 cm/s LV PW:         1.10 cm   LV E/e' medial:  20.8 LV IVS:        1.10 cm   LV e' lateral:   8.81 cm/s LVOT diam:     1.90 cm   LV E/e' lateral: 10.5 LV SV:         40 LV SV Index:   20 LVOT  Area:     2.84 cm LV IVRT:       98 msec  RIGHT VENTRICLE RV S prime:     12.90 cm/s  PULMONARY VEINS TAPSE (M-mode): 1.2 cm      Diastolic Velocity: 31.00 cm/s  S/D Velocity:       0.90                             Systolic Velocity:  28.90 cm/s LEFT ATRIUM           Index LA diam:      4.80 cm 2.46 cm/m LA Vol (A4C): 38.7 ml 19.82 ml/m  AORTIC VALVE LVOT Vmax:   98.70 cm/s LVOT Vmean:  65.100 cm/s LVOT VTI:    0.141 m  AORTA Ao Root diam: 2.80 cm Ao Asc diam:  3.20 cm MITRAL VALVE               TRICUSPID VALVE MV Area (PHT): 5.79 cm    TR Peak grad:   17.6 mmHg MV Decel Time: 131 msec    TR Vmax:        210.00 cm/s MV E velocity: 92.90 cm/s MV A velocity: 33.80 cm/s  SHUNTS MV E/A ratio:  2.75        Systemic VTI:  0.14 m                            Systemic Diam: 1.90 cm Vishnu Priya Mallipeddi Electronically signed by Diannah Late Mallipeddi Signature Date/Time: 02/19/2024/3:27:47 PM    Final (Updated)    Scheduled Meds:  Chlorhexidine  Gluconate Cloth  6 each Topical Daily   divalproex   250 mg Oral Daily   insulin  aspart  0-9 Units Subcutaneous TID AC & HS   latanoprost   1 drop Both Eyes QHS   levothyroxine   50 mcg Oral Q0600   liver oil-zinc  oxide   Topical Q0600   magnesium  oxide  400 mg Oral BID   midodrine   10 mg Oral TID WC   OLANZapine   20 mg Oral QHS   OLANZapine   5 mg Oral QHS   pantoprazole   40 mg Oral BID   polyethylene glycol  17 g Oral Daily   senna-docusate  1 tablet Oral BID   sodium bicarbonate   650 mg Oral BID   Continuous Infusions:  norepinephrine  (LEVOPHED ) Adult infusion Stopped (02/20/24 0512)   piperacillin -tazobactam (ZOSYN )  IV 3.375 g (02/21/24 0105)     LOS: 5 days   Rendall Carwin M.D on 02/21/2024 at 7:52 AM  Go to [email or web address] - for contact info  [facility] - Office  [phone]  If 7PM-7AM, please contact night-coverage [email or web address] 02/21/2024, 7:52 AM

## 2024-02-21 NOTE — Care Management Important Message (Signed)
 Important Message  Patient Details  Name: Maria Pace MRN: [id number] Date of Birth: 08-09-1940   Important Message Given:  Yes - Medicare IM (reviewed letter with sister [name] Baptist telephonically at [phone])     Duwaine LITTIE Ada 02/21/2024, 2:35 PM

## 2024-02-21 NOTE — Progress Notes (Signed)
 Case was discussed today with anesthesia.  Patient had previous evidence of severe pulmonary hypertension and worsening ejection fraction.  Due to this, it was considered that the patient had a high risk of decompensation if undergoing sedation to evaluate episode of melena.  She has not present any more melena episodes and her hemoglobin has remained relatively stable compared to yesterday.  Per anesthesia, we will hold off on sedation and endoscopic evaluation unless strictly indicated if present is presenting overt persistent bleeding with significant anemia and need for recurrent transfusion.  At this point, would recommend continuing with PPI twice daily and bowel regimen.  Will encourage continuing with palliative care involvement and goals of care.    I discussed this with the patient who understood.  I tried reaching her guardian Evern Baptist) but could not get hold of her.  I will get her a detailed voice message with the plan.  GI service will sign-off, please call us  back if you have any more questions.

## 2024-02-21 NOTE — Evaluation (Signed)
 Occupational Therapy Evaluation Patient Details Name: Maria Pace MRN: [id number] DOB: [birth year] Today's Date: 02/21/2024   History of Present Illness   Maria Pace  is a 83 y.o. female,    with medical history significant of bipolar 1 disorder, schizophrenia, essential hypertension, GERD, hyperlipidemia, history of irregular heartbeat, type 2 diabetes mellitus, PAF on apixaban , HFrEF (EF 30-35%), HTN, patient with hospitalization last July, due to acute anemia from GI bleed, for which she refused endoscopy/colonoscopy .  - Was sent from her group home as she was found blood with large amount of diarrhea, dark stools, poor historian due to schizophrenia, she is answering question appropriately but cannot give exact details, she had anemia in July, refused GI evaluation, unfortunately there is no medication list by the patient, by by reviewing dispense record it does appear she is not anymore on her metoprolol  or Eliquis .  - In ED she had fever 101.4, CTA GI bleed protocol with no evidence of active extravasation, but was significant for sterile coral colitis, patient had manual disimpaction by ED physician, no stool in color, was followed by gray loose stool, no melena, but she was Hemoccult positive, her globin was stable, creatinine at baseline, [facility] consulted to admit     Clinical Impressions Pt agreeable to OT evaluation. Per chart review, pt resides at a group home facility. She receives assist to complete ADLS at baseline. Pt refused OOB mobility at this time due to just getting back into bed from [facility]. Pt washed face up in bed independently with assist. Pt follow commands for UE exercises and completes all exercises as instructed with demonstration. Pt would continue to benefit from OT services while in acute care setting.      If plan is discharge home, recommend the following:   A lot of help with walking and/or transfers;A lot of help with  bathing/dressing/bathroom;Assistance with cooking/housework;Direct supervision/assist for medications management;Direct supervision/assist for financial management;Assist for transportation;Help with stairs or ramp for entrance;Supervision due to cognitive status     Functional Status Assessment   Patient has had a recent decline in their functional status and demonstrates the ability to make significant improvements in function in a reasonable and predictable amount of time.     Equipment Recommendations   None recommended by OT      Precautions/Restrictions   Precautions Precautions: Fall Recall of Precautions/Restrictions: Impaired Restrictions Weight Bearing Restrictions Per Provider Order: No     Mobility Bed Mobility Overal bed mobility:  (pt denied mopbility due to nursing staff recently getting her back into bed from [facility])             General bed mobility comments: pt denied mobility due to fatigue from recently getting on and off [facility]    Transfers                   General transfer comment: pt denied mobility due to fatigue          ADL either performed or assessed with clinical judgement   ADL Overall ADL's : Needs assistance/impaired Eating/Feeding: Set up;Sitting   Grooming: Set up;Wash/dry face;Bed level   Upper Body Bathing: Minimal assistance;Sitting   Lower Body Bathing: Moderate assistance;Sitting/lateral leans   Upper Body Dressing : Minimal assistance;Sitting   Lower Body Dressing: Moderate assistance;Sitting/lateral leans   Toilet Transfer: Moderate assistance;Rolling walker (2 wheels)   Toileting- Clothing Manipulation and Hygiene: Moderate assistance;Sitting/lateral lean   Tub/ Shower Transfer: Moderate assistance;Tub bench;Rolling walker (2 wheels)   Functional  mobility during ADLs: Moderate assistance;Maximal assistance;Rolling walker (2 wheels) General ADL Comments: pt washin face in bed independently with set up      Vision Baseline Vision/History: 0 No visual deficits Ability to See in Adequate Light: 0 Adequate Patient Visual Report: No change from baseline Vision Assessment?: No apparent visual deficits            Pertinent Vitals/Pain Pain Assessment Pain Assessment: No/denies pain Pain Intervention(s): Limited activity within patient's tolerance     Extremity/Trunk Assessment Upper Extremity Assessment Upper Extremity Assessment: Generalized weakness   Lower Extremity Assessment Lower Extremity Assessment: Defer to PT evaluation   Cervical / Trunk Assessment Cervical / Trunk Assessment: Kyphotic   Communication Communication Communication: No apparent difficulties Factors Affecting Communication: Reduced clarity of speech   Cognition Arousal: Alert Behavior During Therapy: WFL for tasks assessed/performed                                 Following commands: Intact Following commands impaired: Only follows one step commands consistently     Cueing  General Comments   Cueing Techniques: Verbal cues;Gestural cues      Exercises Exercises: General Upper Extremity General Exercises - Upper Extremity Shoulder Flexion: AROM, Both, 5 reps Shoulder Extension: AROM, Both, 5 reps Shoulder ABduction: AROM, 5 reps, Both Shoulder ADduction: AROM, 5 reps, Both Shoulder Horizontal ABduction: AROM, Both, 5 reps Shoulder Horizontal ADduction: Both, 5 reps, AROM Elbow Flexion: AROM, Both, 5 reps Elbow Extension: AROM, 5 reps Digit Composite Flexion: Both, 5 reps, AROM Composite Extension: AROM, 5 reps, Both   Shoulder Instructions      Home Living Family/patient expects to be discharged to:: Group home Living Arrangements: Group Home Available Help at Discharge: Available 24 hours/day Type of Home: Group Home                       Home Equipment: Rolling Walker (2 wheels);Rollator (4 wheels)   Additional Comments: pt and pt sister confirms pt is a  long term resident of a group home (since 2018)      Prior Functioning/Environment Prior Level of Function : Needs assist       Physical Assist : ADLs (physical);Mobility (physical) Mobility (physical): Transfers;Gait ADLs (physical): Bathing;IADLs;Dressing Mobility Comments: per chart review pt uses a RW ADLs Comments: staff at group home help assist with ADLs when needed    OT Problem List: Decreased strength;Decreased activity tolerance;Decreased safety awareness;Decreased cognition   OT Treatment/Interventions: Self-care/ADL training;Therapeutic exercise;DME and/or AE instruction;Therapeutic activities;Patient/family education      OT Goals(Current goals can be found in the care plan section)   Acute Rehab OT Goals Patient Stated Goal: go to rehab OT Goal Formulation: Patient unable to participate in goal setting Time For Goal Achievement: 03/06/24 Potential to Achieve Goals: Fair ADL Goals Pt Will Perform Upper Body Bathing: with min assist;sitting Pt Will Perform Lower Body Dressing: with set-up;sitting/lateral leans Pt Will Transfer to Toilet: with min assist;bedside commode Pt/caregiver will Perform Home Exercise Program: Increased strength;Both right and left upper extremity;With written HEP provided   OT Frequency:  Min 1X/week    Co-evaluation              AM-PAC OT 6 Clicks Daily Activity     Outcome Measure Help from another person eating meals?: None Help from another person taking care of personal grooming?: None Help from another person toileting, which includes using toliet,  bedpan, or urinal?: A Lot Help from another person bathing (including washing, rinsing, drying)?: A Lot Help from another person to put on and taking off regular upper body clothing?: A Little Help from another person to put on and taking off regular lower body clothing?: A Lot 6 Click Score: 17   End of Session    Activity Tolerance: Patient limited by fatigue Patient  left: in bed;with call bell/phone within reach;with bed alarm set  OT Visit Diagnosis: Muscle weakness (generalized) (M62.81)                Time: 8554-8496 OT Time Calculation (min): 18 min Charges:  OT General Charges $OT Visit: 1 Visit OT Evaluation $OT Eval Low Complexity: 1 Low OT Treatments $Therapeutic Exercise: 8-22 mins    Chiquita LOISE Sermon, OTR/L 02/21/2024, 3:24 PM

## 2024-02-21 NOTE — Plan of Care (Signed)
  Problem: Acute Rehab OT Goals (only OT should resolve) Goal: Pt. Will Perform Upper Body Bathing Flowsheets (Taken 02/21/2024 1523) Pt Will Perform Upper Body Bathing:  with min assist  sitting Goal: Pt. Will Perform Lower Body Dressing Flowsheets (Taken 02/21/2024 1523) Pt Will Perform Lower Body Dressing:  with set-up  sitting/lateral leans Goal: Pt. Will Transfer To Toilet Flowsheets (Taken 02/21/2024 1523) Pt Will Transfer to Toilet:  with min assist  bedside commode Goal: Pt/Caregiver Will Perform Home Exercise Program Flowsheets (Taken 02/21/2024 1523) Pt/caregiver will Perform Home Exercise Program:  Increased strength  Both right and left upper extremity  With written HEP provided  Chiquita Sermon, OTR/L

## 2024-02-21 NOTE — Plan of Care (Signed)
  Problem: Nutrition: Goal: Adequate nutrition will be maintained Outcome: Progressing   Problem: Coping: Goal: Level of anxiety will decrease Outcome: Progressing   Problem: Safety: Goal: Ability to remain free from injury will improve Outcome: Progressing   Problem: Education: Goal: Knowledge of General Education information will improve Description: Including pain rating scale, medication(s)/side effects and non-pharmacologic comfort measures Outcome: Not Progressing

## 2024-02-22 DIAGNOSIS — I5022 Chronic systolic (congestive) heart failure: Secondary | ICD-10-CM | POA: Diagnosis not present

## 2024-02-22 DIAGNOSIS — Z7189 Other specified counseling: Secondary | ICD-10-CM | POA: Diagnosis not present

## 2024-02-22 DIAGNOSIS — K5289 Other specified noninfective gastroenteritis and colitis: Secondary | ICD-10-CM | POA: Diagnosis not present

## 2024-02-22 DIAGNOSIS — R5381 Other malaise: Secondary | ICD-10-CM | POA: Diagnosis not present

## 2024-02-22 DIAGNOSIS — Z515 Encounter for palliative care: Secondary | ICD-10-CM | POA: Diagnosis not present

## 2024-02-22 LAB — CBC
HCT: 28.8 % — ABNORMAL LOW (ref 36.0–46.0)
Hemoglobin: 9.2 g/dL — ABNORMAL LOW (ref 12.0–15.0)
MCH: 29.6 pg (ref 26.0–34.0)
MCHC: 31.9 g/dL (ref 30.0–36.0)
MCV: 92.6 fL (ref 80.0–100.0)
Platelets: 191 K/uL (ref 150–400)
RBC: 3.11 MIL/uL — ABNORMAL LOW (ref 3.87–5.11)
RDW: 15.8 % — ABNORMAL HIGH (ref 11.5–15.5)
WBC: 6 K/uL (ref 4.0–10.5)
nRBC: 0 % (ref 0.0–0.2)

## 2024-02-22 LAB — TSH: TSH: 8.64 u[IU]/mL — ABNORMAL HIGH (ref 0.350–4.500)

## 2024-02-22 LAB — GLUCOSE, CAPILLARY
Glucose-Capillary: 95 mg/dL (ref 70–99)
Glucose-Capillary: 206 mg/dL — ABNORMAL HIGH (ref 70–99)
Glucose-Capillary: 147 mg/dL — ABNORMAL HIGH (ref 70–99)
Glucose-Capillary: 104 mg/dL — ABNORMAL HIGH (ref 70–99)

## 2024-02-22 MED ORDER — LACTATED RINGERS IV BOLUS
500.0000 mL | Freq: Once | INTRAVENOUS | Status: AC
  Administered 2024-02-22: 500 mL via INTRAVENOUS

## 2024-02-22 MED ORDER — LEVOTHYROXINE SODIUM 75 MCG PO TABS
75.0000 ug | ORAL_TABLET | Freq: Every day | ORAL | Status: DC
  Administered 2024-02-23 – 2024-03-01 (×8): 75 ug via ORAL
  Filled 2024-02-22 (×8): qty 1

## 2024-02-22 NOTE — Progress Notes (Addendum)
 PROGRESS NOTE  Maria Pace, is a 83 y.o. female, DOB - 12/17/1940, FMW:[id number]  Admit date - 02/16/2024   Admitting Physician Brayton GORMAN Lye, MD  Outpatient Primary MD for the patient is Gammon, Chrystal, NP  LOS - 6  Brief Narrative:    83 y.o. female,    with medical history significant of bipolar 1 disorder, schizophrenia, essential hyprtension, GERD, hyperlipidemia, history of irregular heartbeat, type 2 diabetes mellitus, PAF on apixaban , HFrEF (EF 30-35%), HTN, patient with hospitalization last July, due to acute anemia from GI bleed, for which she refused endoscopy/colonoscopy . - Was sent from her group home as she was found blood with large amount of diarrhea, dark stools, poor historian due to schizophrenia, she is answering question appropriately but cannot give exact details, she had anemia in July, refused GI evaluation, unfortunately there is no medication list by the patient, by by reviewing dispense record it does appear she is not anymore on her metoprolol  or Eliquis .   Patient has been admitted for stercoral colitis with manual disimpaction performed in the ED.  She remains on IV Zosyn  and has been seen by GI with plans to advance diet as tolerated as well as aggressive bowel regimen.  She has previously refused any endoscopy and plans are for supportive care as well as potential SNF on discharge.  Transferred to stepdown unit 11/23 for closer blood pressure monitoring with low readings noted this a.m. likely from inaccurate blood pressure readings.  Started on IV fluid for AKI.  She has been taken off of norepinephrine  early this morning and IV fluids have been held.  NB!! Plan is for placement to SNF facility, unless patient improves enough to meet group home level of care- --patient Sister is now patient's legal guardian   -Assessment and Plan: 1)Sepsis, POA due to Sterocoral colitis Hemoccult positive stool -Occult positive stool, but hemoglobin is stable, had  manual disimpaction in ED, impacted stool was brown in color, no melena or bright red blood per rectum, followed by diarrhea dark gray in color (is on iron  tablets), no melena, Hemoccult positive stool most likely due to colitis and rectal trauma from manual disimpaction. -Blood cultures NGTD - treated with IV Zosyn --overall much improved WBC is down to 5.3 from 11.0, transitioned to oral Augmentin  on 02/22/2024 - Continue on laxative regimen -PTA aspirin  remains on hold - Appreciate GI recommendations to conservatively manage and follow CBC and continue PPI twice daily as well as laxatives -CTA GI bleeding protocol is negative -Anesthesia and GI team reluctant to do colonoscopy due to concerns about cardiovascular status with low EF and pulmonary hypertension in the setting of relatively stable Hgb -Appreciate palliative consultation for goals of care with guardianship pending.  Continue full scope of care for now per discussion.  TOC working on SNF placement. - Cortisol 14.5 and 2D echocardiogram with low EF, but similar to prior. - Patient has been weaned off Levophed  as early hours of of 02/20/2024  - BP stable on midodrine   2)Acute on chronic Anemia--Hgb > 9 -No obvious bleeding at this time -GI team reluctant to perform endoscopy as noted above -Underlying chronic anemia may be partly due to CKD   3)Chronic HFrEF - 09/26/2023 echo EF 30-35%, normal RVF, moderate MR Repeat Echo on 02/19/24--- EF is still 30 to 35%,  -left Ventricle: No LV thrombus by Definity . Left ventricular ejection fraction, by estimation, is 30 to 35%. The left ventricle has moderately decreased function. The left ventricle demonstrates regional wall motion  abnormalities.  There is no left ventricular hypertrophy. Abnormal (paradoxical) septal motion, consistent with left bundle branch block.   LV Wall Scoring:  The entire anterior wall and anterior septum are akinetic. The inferior  septum, entire inferior wall, and  posterior wall are hypokinetic. The apex  is normal.  -Pressure is soft we will hold on Entresto  and Aldactone  - restarted Toprol -XL at 12.5 mg twice daily-monitor BP   Paroxysmal atrial fibrillation -- Continue with amiodarone  -By reviewing records it does appear she has not been on Eliquis  for few months currently, and I would assume this is most likely in the setting of her previous anemia due to GI bleed and previously refusing anemia workup as it does appear risks outweighed benefits anticoagulation -restarted Toprol -XL at 12.5 mg twice daily-monitor BP   Schizophrenia, unspecified type - Behavioral issues improving -- continue olanzapine  - Continue Depakote  -Working on guardianship   AKI on CKD stage IIIb with hyponatremia and metabolic acidosis -Likely in the setting of hypotension  - Creatinine is down to 1.25 from 2.73 on 02/19/2024 -- renally adjust medications, avoid nephrotoxic agents / dehydration  / hypotension    Diabetes mellitus type 2, controlled - A1c--5.2  -hold metformin  Use Novolog /Humalog Sliding scale insulin  with Accu-Cheks/Fingersticks as ordered    Morbid obesity-class III -Low calorie diet, portion control and increase physical activity discussed with patient -Body mass index is 43.44 kg/m.  Hypothyroidism-recheck TSH 8.64 from 47.0 on 10/26/2023 - levothyroxine  increased to 75 mcg daily  Disposition--patient originally from group home, however due to lack of cooperation with care and requiring higher level of care group home unable to take her back - Plan is for placement to SNF facility, unless patient improves enough to meet group home level of care- --patient Sister is now patient's legal guardian -- Social/ethics--palliative care consult appreciated remains a full code without limitations to treatment - Guardianship initiated  -Acute urinary retention--Foley removed a.m. of 02/21/2024 --Patient is voiding well, continue Flomax   Status is:  Inpatient   Disposition: The patient is from: Group home              Anticipated d/c is to: SNF              Anticipated d/c date is: 1 day              Patient currently is not medically stable to d/c. Barriers: Not Clinically Stable-   Code Status :  -  Code Status: Full Code   Family Communication:    Sister [name] Baptist -[phone] --is primary contact DVT Prophylaxis  :   - SCDs  SCDs Start: 02/16/24 2111   Lab Results  Component Value Date   PLT 191 02/22/2024   Inpatient Medications  Scheduled Meds:  amiodarone   200 mg Oral Daily   amoxicillin -clavulanate  1 tablet Oral Q12H   Chlorhexidine  Gluconate Cloth  6 each Topical Daily   divalproex   250 mg Oral Daily   insulin  aspart  0-9 Units Subcutaneous TID AC & HS   latanoprost   1 drop Both Eyes QHS   [START ON 02/23/2024] levothyroxine   75 mcg Oral Q0600   liver oil-zinc  oxide   Topical Q0600   magnesium  oxide  400 mg Oral BID   metoprolol  succinate  12.5 mg Oral BID   midodrine   10 mg Oral TID WC   multivitamins with iron   1 tablet Oral Daily   OLANZapine   20 mg Oral QHS   OLANZapine   5 mg  Oral QHS   pantoprazole   40 mg Oral BID   polyethylene glycol  17 g Oral Daily   senna-docusate  1 tablet Oral BID   sodium bicarbonate   650 mg Oral BID   tamsulosin   0.4 mg Oral QPC supper   Continuous Infusions:   PRN Meds:.acetaminophen  **OR** acetaminophen , albuterol , HYDROcodone -acetaminophen , ondansetron  **OR** ondansetron  (ZOFRAN ) IV, mouth rinse, polyethylene glycol   Anti-infectives (From admission, onward)    Start     Dose/Rate Route Frequency Ordered Stop   02/21/24 2200  amoxicillin -clavulanate (AUGMENTIN ) 875-125 MG per tablet 1 tablet        1 tablet Oral Every 12 hours 02/21/24 1755 02/24/24 2159   02/20/24 0815  piperacillin -tazobactam (ZOSYN ) IVPB 3.375 g  Status:  Discontinued        3.375 g 12.5 mL/hr over 240 Minutes Intravenous Every 8 hours 02/20/24 0800 02/21/24 1755   02/18/24 2200   piperacillin -tazobactam (ZOSYN ) IVPB 3.375 g  Status:  Discontinued        3.375 g 12.5 mL/hr over 240 Minutes Intravenous Every 12 hours 02/18/24 1352 02/20/24 0800   02/17/24 0400  piperacillin -tazobactam (ZOSYN ) IVPB 3.375 g  Status:  Discontinued        3.375 g 12.5 mL/hr over 240 Minutes Intravenous Every 8 hours 02/16/24 2126 02/18/24 1352   02/16/24 2000  piperacillin -tazobactam (ZOSYN ) IVPB 3.375 g        3.375 g 100 mL/hr over 30 Minutes Intravenous  Once 02/16/24 1949 02/17/24 1200       Subjective: Maria Pace today has no fevers, no emesis,  No chest pain,    More cooperative, more calm, - Voiding well  Objective: Vitals:   02/22/24 1200 02/22/24 1400 02/22/24 1500 02/22/24 1600  BP: 128/80 (!) 118/50 (!) 112/53 (!) 120/56  Pulse: 85 72 70 67  Resp: (!) 22 (!) 21 (!) 22 20  Temp:      TempSrc:      SpO2: 96% 99% 97% 96%  Weight:      Height:        Intake/Output Summary (Last 24 hours) at 02/22/2024 1646 Last data filed at 02/22/2024 0830 Gross per 24 hour  Intake 240 ml  Output 1100 ml  Net -860 ml   Filed Weights   02/18/24 0758 02/19/24 0445 02/21/24 0936  Weight: 99.4 kg 100.9 kg 100.9 kg    Physical Exam  Gen:- Awake Alert, no acute distress HEENT:- [place].AT, No sclera icterus Neck-Supple Neck,No JVD,.  Lungs-  CTAB , fair symmetrical air movement CV- S1, S2 normal, irregular  Abd-  +ve B.Sounds, Abd Soft, No tenderness,    Extremity/Skin:-Improved edema, pedal pulses present  Psych-affect is appropriate, oriented x3, More cooperative, more calm, Neuro-no new focal deficits, no tremors  Data Reviewed: I have personally reviewed following labs and imaging studies  CBC: Recent Labs  Lab 02/16/24 1610 02/16/24 2025 02/18/24 0521 02/19/24 0435 02/20/24 0619 02/21/24 0527 02/22/24 0528  WBC 12.3*   < > 10.3 11.0* 8.4 5.3 6.0  NEUTROABS 9.5*  --   --   --   --   --   --   HGB 12.1   < > 9.5* 9.4* 10.2* 9.3* 9.2*  HCT 38.0   < > 29.7*  28.9* 31.7* 29.0* 28.8*  MCV 92.5   < > 93.1 91.2 93.2 91.2 92.6  PLT 219   < > 183 202 197 199 191   < > = values in this interval not displayed.   Basic Metabolic  Panel: Recent Labs  Lab 02/17/24 0445 02/18/24 0521 02/19/24 0435 02/20/24 0619 02/21/24 0527  NA 137 131* 132* 135 137  K 4.3 4.4 4.3 4.1 4.2  CL 102 96* 98 107 106  CO2 22 21* 21* 19* 22  GLUCOSE 76 116* 86 101* 73  BUN 35* 41* 40* 29* 17  CREATININE 1.68* 2.55* 2.73* 1.68* 1.25*  CALCIUM  8.3* 8.1* 8.0* 8.2* 8.4*  MG  --  1.7 2.1 2.0 1.7   GFR: Estimated Creatinine Clearance: 37.7 mL/min (A) (by C-G formula based on SCr of 1.25 mg/dL (H)). Liver Function Tests: Recent Labs  Lab 02/16/24 1610  AST 49*  ALT 33  ALKPHOS 88  BILITOT 0.4  PROT 7.0  ALBUMIN 3.6   Recent Results (from the past 240 hours)  Culture, blood (routine x 2)     Status: None   Collection Time: 02/16/24  8:25 PM   Specimen: BLOOD  Result Value Ref Range Status   Specimen Description BLOOD LEFT ANTECUBITAL  Final   Special Requests   Final    BOTTLES DRAWN AEROBIC ONLY Blood Culture adequate volume   Culture   Final    NO GROWTH 5 DAYS Performed at [facility], [address], [place], [place] [address]    Report Status 02/21/2024 FINAL  Final  Culture, blood (routine x 2)     Status: None   Collection Time: 02/16/24  8:34 PM   Specimen: BLOOD  Result Value Ref Range Status   Specimen Description BLOOD RIGHT ANTECUBITAL  Final   Special Requests   Final    BOTTLES DRAWN AEROBIC AND ANAEROBIC Blood Culture adequate volume   Culture   Final    NO GROWTH 5 DAYS Performed at [facility], [address] [place], [place] [address]    Report Status 02/21/2024 FINAL  Final  MRSA Next Gen by PCR, Nasal     Status: None   Collection Time: 02/18/24  8:10 AM   Specimen: Nasal Mucosa; Nasal Swab  Result Value Ref Range Status   MRSA by PCR Next Gen NOT DETECTED NOT DETECTED Final    Comment: (NOTE) The GeneXpert MRSA Assay (FDA  approved for NASAL specimens only), is one component of a comprehensive MRSA colonization surveillance program. It is not intended to diagnose MRSA infection nor to guide or monitor treatment for MRSA infections. Test performance is not FDA approved in patients less than 65 years old. Performed at [facility], [address], [place], [place] [address]     Radiology Studies: No results found.  Scheduled Meds:  amiodarone   200 mg Oral Daily   amoxicillin -clavulanate  1 tablet Oral Q12H   Chlorhexidine  Gluconate Cloth  6 each Topical Daily   divalproex   250 mg Oral Daily   insulin  aspart  0-9 Units Subcutaneous TID AC & HS   latanoprost   1 drop Both Eyes QHS   [START ON 02/23/2024] levothyroxine   75 mcg Oral Q0600   liver oil-zinc  oxide   Topical Q0600   magnesium  oxide  400 mg Oral BID   metoprolol  succinate  12.5 mg Oral BID   midodrine   10 mg Oral TID WC   multivitamins with iron   1 tablet Oral Daily   OLANZapine   20 mg Oral QHS   OLANZapine   5 mg Oral QHS   pantoprazole   40 mg Oral BID   polyethylene glycol  17 g Oral Daily   senna-docusate  1 tablet Oral BID   sodium bicarbonate   650 mg  Oral BID   tamsulosin   0.4 mg Oral QPC supper   Continuous Infusions:   LOS: 6 days   Rendall Carwin M.D on 02/22/2024 at 4:46 PM  Go to [email or web address] - for contact info  [facility] - Office  [phone]  If 7PM-7AM, please contact night-coverage [email or web address] 02/22/2024, 4:46 PM

## 2024-02-22 NOTE — Progress Notes (Signed)
 Palliative:  HPI: 83 y.o. female with past medical history of schizophrenia, bipolar 1, GIB, afib (no anticoagulation d/t GIB), HFrEF, HTN, HLD, DM2, GERD admitted on 02/16/2024 from group home with large amount of diarrhea with concern for blood.   I met today with Maria Pace. No family at bedside. Sister/guardian was planning to discuss code status and goals of care with their family today. Recommend follow up with her regarding outcome of family discussion. Awaiting SNF rehab options. Maria Pace is in good spirits. Ongoing poor recall of her situation. Fed herself breakfast and ate well. Smiling and continues to be motivated to try and improve. Recommend palliative follow up Dec 1 and outpatient palliative follow up as well. Needs ongoing discussion.    Exam: Alert but with poor recallt. Good spirits - smiling. No distress. Breathing regular, unlabored. Abd soft. Generalized weakness.    Plan: - Full code - family having ongoing conversations - Full scope at this time per sister who is guardian    40 min   [name], NP Palliative Medicine Team Pager [phone] (Please see [email or web address] for schedule) Team Phone [phone]

## 2024-02-22 NOTE — Plan of Care (Signed)
   Problem: Activity: Goal: Risk for activity intolerance will decrease Outcome: Progressing   Problem: Coping: Goal: Level of anxiety will decrease Outcome: Progressing

## 2024-02-23 DIAGNOSIS — K5289 Other specified noninfective gastroenteritis and colitis: Secondary | ICD-10-CM | POA: Diagnosis not present

## 2024-02-23 LAB — RENAL FUNCTION PANEL
Albumin: 2.6 g/dL — ABNORMAL LOW (ref 3.5–5.0)
Anion gap: 8 (ref 5–15)
BUN: 14 mg/dL (ref 8–23)
CO2: 25 mmol/L (ref 22–32)
Calcium: 8.5 mg/dL — ABNORMAL LOW (ref 8.9–10.3)
Chloride: 104 mmol/L (ref 98–111)
Creatinine, Ser: 1.22 mg/dL — ABNORMAL HIGH (ref 0.44–1.00)
GFR, Estimated: 44 mL/min — ABNORMAL LOW (ref >60)
Glucose, Bld: 96 mg/dL (ref 70–99)
Phosphorus: 2.5 mg/dL (ref 2.5–4.6)
Potassium: 4.3 mmol/L (ref 3.5–5.1)
Sodium: 137 mmol/L (ref 135–145)

## 2024-02-23 LAB — GLUCOSE, CAPILLARY
Glucose-Capillary: 92 mg/dL (ref 70–99)
Glucose-Capillary: 172 mg/dL — ABNORMAL HIGH (ref 70–99)
Glucose-Capillary: 138 mg/dL — ABNORMAL HIGH (ref 70–99)
Glucose-Capillary: 139 mg/dL — ABNORMAL HIGH (ref 70–99)

## 2024-02-23 LAB — HEMOGLOBIN AND HEMATOCRIT, BLOOD
HCT: 26 % — ABNORMAL LOW (ref 36.0–46.0)
Hemoglobin: 8.5 g/dL — ABNORMAL LOW (ref 12.0–15.0)

## 2024-02-23 NOTE — Progress Notes (Signed)
 Mobility Specialist Progress Note:    02/23/24 1010  Mobility  Activity Ambulated with assistance;Pivoted/transferred to/from [facility];Pivoted/transferred from bed to chair  Level of Assistance Minimal assist, patient does 75% or more  Assistive Device Front wheel walker  Distance Ambulated (ft) 6 ft  Range of Motion/Exercises Active;All extremities  Activity Response Tolerated well  Mobility Referral Yes  Mobility visit 1 Mobility  Mobility Specialist Start Time (ACUTE ONLY) 1010  Mobility Specialist Stop Time (ACUTE ONLY) 1030  Mobility Specialist Time Calculation (min) (ACUTE ONLY) 20 min   Pt received in bed, agreeable to mobility. Required MinA to stand and ambulate with RW. Tolerated well, asx throughout. Left in chair, call bell in reach. Nurse in room, all needs met.  Zsazsa Bahena Mobility Specialist Please contact via Special Educational Needs Teacher or  Rehab office at [phone]

## 2024-02-23 NOTE — Progress Notes (Signed)
 PROGRESS NOTE  Maria Pace, is a 83 y.o. female, DOB - [birth year], FMW:[id number]  Admit date - 02/16/2024   Admitting Physician Brayton GORMAN Lye, MD  Outpatient Primary MD for the patient is Gammon, Chrystal, NP  LOS - 7  Brief Narrative:    83 y.o. female,    with medical history significant of bipolar 1 disorder, schizophrenia, essential hyprtension, GERD, hyperlipidemia, history of irregular heartbeat, type 2 diabetes mellitus, PAF on apixaban , HFrEF (EF 30-35%), HTN, patient with hospitalization last July, due to acute anemia from GI bleed, for which she refused endoscopy/colonoscopy . - Was sent from her group home as she was found blood with large amount of diarrhea, dark stools, poor historian due to schizophrenia, she is answering question appropriately but cannot give exact details, she had anemia in July, refused GI evaluation, unfortunately there is no medication list by the patient, by by reviewing dispense record it does appear she is not anymore on her metoprolol  or Eliquis .   Patient has been admitted for stercoral colitis with manual disimpaction performed in the ED.  She remains on IV Zosyn  and has been seen by GI with plans to advance diet as tolerated as well as aggressive bowel regimen.  She has previously refused any endoscopy and plans are for supportive care as well as potential SNF on discharge.  Transferred to stepdown unit 11/23 for closer blood pressure monitoring with low readings noted this a.m. likely from inaccurate blood pressure readings.  Started on IV fluid for AKI.  She has been taken off of norepinephrine  early this morning and IV fluids have been held.  NB!! Patient remains medically stable for discharge to SNF facility, unless patient improves enough to meet group home level of care- --patient Sister is now patient's legal guardian   -Assessment and Plan: 1)Sepsis, POA due to Sterocoral colitis Hemoccult positive stool -Occult positive stool, but  hemoglobin is stable, had manual disimpaction in ED, impacted stool was brown in color, no melena or bright red blood per rectum, followed by diarrhea dark gray in color (is on iron  tablets), no melena, Hemoccult positive stool most likely due to colitis and rectal trauma from manual disimpaction. -Blood cultures NGTD - treated with IV Zosyn --overall much improved WBC is down to 5.3 from 11.0, transitioned to oral Augmentin  on 02/22/2024, last dose 02/25/2024 - Continue on laxative regimen -PTA aspirin  remains on hold - Appreciate GI recommendations to conservatively manage and follow CBC and continue PPI twice daily as well as laxatives -CTA GI bleeding protocol is negative -Anesthesia and GI team reluctant to do colonoscopy due to concerns about cardiovascular status with low EF and pulmonary hypertension in the setting of relatively stable Hgb -Appreciate palliative consultation for goals of care with guardianship pending.  Continue full scope of care for now per discussion.  TOC working on SNF placement. - Cortisol 14.5 and 2D echocardiogram with low EF, but similar to prior. - Patient has been weaned off Levophed  as early hours of of 02/20/2024  - BP stable on midodrine   2)Acute on chronic Anemia--Hgb drifting down slowly , currently greater than 8 -No obvious bleeding at this time -GI team reluctant to perform endoscopy as noted above -Underlying chronic anemia may be partly due to CKD   3)Chronic HFrEF - 09/26/2023 echo EF 30-35%, normal RVF, moderate MR Repeat Echo on 02/19/24--- EF is still 30 to 35%,  -left Ventricle: No LV thrombus by Definity . Left ventricular ejection fraction, by estimation, is 30 to 35%. The left ventricle  has moderately decreased function. The left ventricle demonstrates regional wall motion abnormalities.  There is no left ventricular hypertrophy. Abnormal (paradoxical) septal motion, consistent with left bundle branch block.   LV Wall Scoring:  The entire  anterior wall and anterior septum are akinetic. The inferior  septum, entire inferior wall, and posterior wall are hypokinetic. The apex  is normal.  -Pressure is soft we will hold on Entresto  and Aldactone  - restarted Toprol -XL at 12.5 mg twice daily-monitor BP   Paroxysmal atrial fibrillation -- Continue with amiodarone  -By reviewing records it does appear she has not been on Eliquis  for few months currently, and I would assume this is most likely in the setting of her previous anemia due to GI bleed and previously refusing anemia workup as it does appear risks outweighed benefits anticoagulation -restarted Toprol -XL at 12.5 mg twice daily-monitor BP   Schizophrenia, unspecified type - Behavioral issues improving -- continue olanzapine  - Continue Depakote  -Working on guardianship   AKI on CKD stage IIIb with hyponatremia and metabolic acidosis -Likely in the setting of hypotension  - Creatinine is down to 1.2 from 2.73 on 02/19/2024 -- renally adjust medications, avoid nephrotoxic agents / dehydration  / hypotension    Diabetes mellitus type 2, controlled - A1c--5.2  -hold metformin  Use Novolog /Humalog Sliding scale insulin  with Accu-Cheks/Fingersticks as ordered    Morbid obesity-class III -Low calorie diet, portion control and increase physical activity discussed with patient -Body mass index is 43.44 kg/m.  Hypothyroidism-recheck TSH 8.64 from 47.0 on 10/26/2023 - levothyroxine  increased to 75 mcg daily  Disposition--patient originally from group home, however due to lack of cooperation with care and requiring higher level of care group home unable to take her back -Patient remains medically stable for discharge to SNF facility, unless patient improves enough to meet group home level of care- --patient Sister is now patient's legal guardian -- Social/ethics--palliative care consult appreciated remains a full code without limitations to treatment - Guardianship  initiated  -Acute urinary retention--Foley removed a.m. of 02/21/2024 --Patient is voiding well, continue Flomax   Status is: Inpatient   Disposition: The patient is from: Group home              Anticipated d/c is to: SNF              Anticipated d/c date is: 1 day              Patient currently is medically stable to d/c. Barriers: Not Clinically Stable-   Code Status :  -  Code Status: Full Code   Family Communication:    Sister [name] Baptist -[phone] --is primary contact DVT Prophylaxis  :   - SCDs  SCDs Start: 02/16/24 2111   Lab Results  Component Value Date   PLT 191 02/22/2024   Inpatient Medications  Scheduled Meds:  amiodarone   200 mg Oral Daily   amoxicillin -clavulanate  1 tablet Oral Q12H   Chlorhexidine  Gluconate Cloth  6 each Topical Daily   divalproex   250 mg Oral Daily   insulin  aspart  0-9 Units Subcutaneous TID AC & HS   latanoprost   1 drop Both Eyes QHS   levothyroxine   75 mcg Oral Q0600   liver oil-zinc  oxide   Topical Q0600   magnesium  oxide  400 mg Oral BID   metoprolol  succinate  12.5 mg Oral BID   midodrine   10 mg Oral TID WC   multivitamins with iron   1 tablet Oral Daily   OLANZapine   20 mg  Oral QHS   OLANZapine   5 mg Oral QHS   pantoprazole   40 mg Oral BID   polyethylene glycol  17 g Oral Daily   senna-docusate  1 tablet Oral BID   sodium bicarbonate   650 mg Oral BID   tamsulosin   0.4 mg Oral QPC supper   Continuous Infusions:   PRN Meds:.acetaminophen  **OR** acetaminophen , albuterol , HYDROcodone -acetaminophen , ondansetron  **OR** ondansetron  (ZOFRAN ) IV, mouth rinse, polyethylene glycol   Anti-infectives (From admission, onward)    Start     Dose/Rate Route Frequency Ordered Stop   02/21/24 2200  amoxicillin -clavulanate (AUGMENTIN ) 875-125 MG per tablet 1 tablet        1 tablet Oral Every 12 hours 02/21/24 1755 02/24/24 2159   02/20/24 0815  piperacillin -tazobactam (ZOSYN ) IVPB 3.375 g  Status:  Discontinued        3.375 g 12.5  mL/hr over 240 Minutes Intravenous Every 8 hours 02/20/24 0800 02/21/24 1755   02/18/24 2200  piperacillin -tazobactam (ZOSYN ) IVPB 3.375 g  Status:  Discontinued        3.375 g 12.5 mL/hr over 240 Minutes Intravenous Every 12 hours 02/18/24 1352 02/20/24 0800   02/17/24 0400  piperacillin -tazobactam (ZOSYN ) IVPB 3.375 g  Status:  Discontinued        3.375 g 12.5 mL/hr over 240 Minutes Intravenous Every 8 hours 02/16/24 2126 02/18/24 1352   02/16/24 2000  piperacillin -tazobactam (ZOSYN ) IVPB 3.375 g        3.375 g 100 mL/hr over 30 Minutes Intravenous  Once 02/16/24 1949 02/17/24 1200       Subjective: Naomie Hurst today has no fevers, no emesis,  No chest pain,    No new complaints or concerns  Patient remains medically stable for discharge to SNF facility, unless patient improves enough to meet group home level of care- --patient Sister is now patient's legal guardian  Objective: Vitals:   02/22/24 1839 02/22/24 1948 02/23/24 0415 02/23/24 0838  BP: (!) 125/56 127/67 120/67 130/75  Pulse: 72 74 76 96  Resp: 18 16 16    Temp: 98.1 F (36.7 C) 98.5 F (36.9 C) 98.7 F (37.1 C)   TempSrc: Oral     SpO2: 98% 99% 94%   Weight:      Height:        Intake/Output Summary (Last 24 hours) at 02/23/2024 1043 Last data filed at 02/23/2024 0825 Gross per 24 hour  Intake 720 ml  Output 1350 ml  Net -630 ml   Filed Weights   02/18/24 0758 02/19/24 0445 02/21/24 0936  Weight: 99.4 kg 100.9 kg 100.9 kg    Physical Exam  Gen:- Awake Alert, no acute distress HEENT:- [place].AT, No sclera icterus Neck-Supple Neck,No JVD,.  Lungs-  CTAB , fair symmetrical air movement CV- S1, S2 normal, irregular  Abd-  +ve B.Sounds, Abd Soft, No tenderness,    Extremity/Skin:-Improved edema, pedal pulses present  Psych-affect is appropriate, oriented x3, More cooperative, more calm, Neuro-generalized weakness, no new focal deficits, no tremors  Data Reviewed: I have personally reviewed following  labs and imaging studies  CBC: Recent Labs  Lab 02/16/24 1610 02/16/24 2025 02/18/24 0521 02/19/24 0435 02/20/24 0619 02/21/24 0527 02/22/24 0528 02/23/24 0421  WBC 12.3*   < > 10.3 11.0* 8.4 5.3 6.0  --   NEUTROABS 9.5*  --   --   --   --   --   --   --   HGB 12.1   < > 9.5* 9.4* 10.2* 9.3* 9.2* 8.5*  HCT  38.0   < > 29.7* 28.9* 31.7* 29.0* 28.8* 26.0*  MCV 92.5   < > 93.1 91.2 93.2 91.2 92.6  --   PLT 219   < > 183 202 197 199 191  --    < > = values in this interval not displayed.   Basic Metabolic Panel: Recent Labs  Lab 02/18/24 0521 02/19/24 0435 02/20/24 0619 02/21/24 0527 02/23/24 0421  NA 131* 132* 135 137 137  K 4.4 4.3 4.1 4.2 4.3  CL 96* 98 107 106 104  CO2 21* 21* 19* 22 25  GLUCOSE 116* 86 101* 73 96  BUN 41* 40* 29* 17 14  CREATININE 2.55* 2.73* 1.68* 1.25* 1.22*  CALCIUM  8.1* 8.0* 8.2* 8.4* 8.5*  MG 1.7 2.1 2.0 1.7  --   PHOS  --   --   --   --  2.5   GFR: Estimated Creatinine Clearance: 38.7 mL/min (A) (by C-G formula based on SCr of 1.22 mg/dL (H)). Liver Function Tests: Recent Labs  Lab 02/16/24 1610 02/23/24 0421  AST 49*  --   ALT 33  --   ALKPHOS 88  --   BILITOT 0.4  --   PROT 7.0  --   ALBUMIN 3.6 2.6*   Recent Results (from the past 240 hours)  Culture, blood (routine x 2)     Status: None   Collection Time: 02/16/24  8:25 PM   Specimen: BLOOD  Result Value Ref Range Status   Specimen Description BLOOD LEFT ANTECUBITAL  Final   Special Requests   Final    BOTTLES DRAWN AEROBIC ONLY Blood Culture adequate volume   Culture   Final    NO GROWTH 5 DAYS Performed at [facility], [address] [place], [place] [address]    Report Status 02/21/2024 FINAL  Final  Culture, blood (routine x 2)     Status: None   Collection Time: 02/16/24  8:34 PM   Specimen: BLOOD  Result Value Ref Range Status   Specimen Description BLOOD RIGHT ANTECUBITAL  Final   Special Requests   Final    BOTTLES DRAWN AEROBIC AND ANAEROBIC Blood Culture  adequate volume   Culture   Final    NO GROWTH 5 DAYS Performed at [facility], [address], [place], [place] [address]    Report Status 02/21/2024 FINAL  Final  MRSA Next Gen by PCR, Nasal     Status: None   Collection Time: 02/18/24  8:10 AM   Specimen: Nasal Mucosa; Nasal Swab  Result Value Ref Range Status   MRSA by PCR Next Gen NOT DETECTED NOT DETECTED Final    Comment: (NOTE) The GeneXpert MRSA Assay (FDA approved for NASAL specimens only), is one component of a comprehensive MRSA colonization surveillance program. It is not intended to diagnose MRSA infection nor to guide or monitor treatment for MRSA infections. Test performance is not FDA approved in patients less than 81 years old. Performed at [facility], [address] [place], [place] [address]     Radiology Studies: No results found.  Scheduled Meds:  amiodarone   200 mg Oral Daily   amoxicillin -clavulanate  1 tablet Oral Q12H   Chlorhexidine  Gluconate Cloth  6 each Topical Daily   divalproex   250 mg Oral Daily   insulin  aspart  0-9 Units Subcutaneous TID AC & HS   latanoprost   1 drop Both Eyes QHS   levothyroxine   75 mcg Oral Q0600   liver oil-zinc  oxide   Topical  Q0600   magnesium  oxide  400 mg Oral BID   metoprolol  succinate  12.5 mg Oral BID   midodrine   10 mg Oral TID WC   multivitamins with iron   1 tablet Oral Daily   OLANZapine   20 mg Oral QHS   OLANZapine   5 mg Oral QHS   pantoprazole   40 mg Oral BID   polyethylene glycol  17 g Oral Daily   senna-docusate  1 tablet Oral BID   sodium bicarbonate   650 mg Oral BID   tamsulosin   0.4 mg Oral QPC supper   Continuous Infusions:   LOS: 7 days   Rendall Carwin M.D on 02/23/2024 at 10:43 AM  Go to [email or web address] - for contact info  [facility] - Office  [phone]  If 7PM-7AM, please contact night-coverage [email or web address] 02/23/2024, 10:43 AM

## 2024-02-23 NOTE — Care Management Important Message (Signed)
 Important Message  Patient Details  Name: NEREA BORDENAVE MRN: [id number] Date of Birth: 1940-09-28   Important Message Given:  Yes - Medicare IM (reviewed letter with sister [name] Baptist telephonically at [phone])     Glade Cuff 02/23/2024, 3:20 PM

## 2024-02-23 NOTE — TOC Progression Note (Addendum)
 Transition of Care [facility]) - Progression Note    Patient Details  Name: Maria Pace MRN: [id number] Date of Birth: 08-08-1940  Transition of [facility] CM/SW Contact  Sharlyne Stabs, RN Phone Number: 02/23/2024, 11:47 AM  Clinical Narrative:   MD is asking if patient can return to group home, she has improved. CM called her sister, [name], she will come assess. She would like for her to return to group home, because she is comfortable there and knows the staff. We have no bed offers, probably due to the holidays. CM left MUSE with APS a voice message also.    Addendum BETHA [name], sister is here, she agrees patient can go back to [facility], she is back to baseline.  Randie will come assess later today to approve discharge. IPCM following.    Expected Discharge Plan: Group Home Barriers to Discharge: Continued Medical Work up     Expected Discharge Plan and Services In-house Referral: Clinical Social Work Discharge Planning Services: CM Consult Post Acute Care Choice: Home Health Living arrangements for the past 2 months: Group Home                    Social Drivers of Health (SDOH) Interventions SDOH Screenings   Food Insecurity: No Food Insecurity (02/16/2024)  Housing: Low Risk  (02/16/2024)  Transportation Needs: No Transportation Needs (02/16/2024)  Recent Concern: Transportation Needs - Unmet Transportation Needs (02/16/2024)  Utilities: Not At Risk (02/16/2024)  Depression (PHQ2-9): High Risk (09/06/2021)  Social Connections: Unknown (02/16/2024)  Tobacco Use: Low Risk  (02/21/2024)    Readmission Risk Interventions    02/18/2024   10:48 AM 02/17/2024   12:13 PM 07/14/2022   11:16 AM  Readmission Risk Prevention Plan  Transportation Screening Complete Complete Complete  PCP or Specialist Appt within 5-7 Days   Not Complete  PCP or Specialist Appt within 3-5 Days Complete    Home Care Screening   Complete  Medication Review (RN CM)   Complete   HRI or Home Care Consult Complete Complete   Social Work Consult for Recovery Care Planning/Counseling Complete Complete   Palliative Care Screening Not Applicable Not Applicable   Medication Review Oceanographer) Complete Complete

## 2024-02-23 NOTE — Plan of Care (Signed)
 Problem: Education: Goal: Knowledge of General Education information will improve Description: Including pain rating scale, medication(s)/side effects and non-pharmacologic comfort measures Outcome: Progressing   Problem: Health Behavior/Discharge Planning: Goal: Ability to manage health-related needs will improve Outcome: Progressing   Problem: Clinical Measurements: Goal: Ability to maintain clinical measurements within normal limits will improve Outcome: Progressing Goal: Will remain free from infection Outcome: Progressing Goal: Diagnostic test results will improve Outcome: Progressing Goal: Respiratory complications will improve Outcome: Progressing Goal: Cardiovascular complication will be avoided Outcome: Progressing   Problem: Activity: Goal: Risk for activity intolerance will decrease Outcome: Progressing   Problem: Nutrition: Goal: Adequate nutrition will be maintained Outcome: Progressing   Problem: Coping: Goal: Level of anxiety will decrease Outcome: Progressing   Problem: Elimination: Goal: Will not experience complications related to bowel motility Outcome: Progressing Goal: Will not experience complications related to urinary retention Outcome: Progressing   Problem: Pain Managment: Goal: General experience of comfort will improve and/or be controlled Outcome: Progressing   Problem: Safety: Goal: Ability to remain free from injury will improve Outcome: Progressing   Problem: Skin Integrity: Goal: Risk for impaired skin integrity will decrease Outcome: Progressing   Problem: Education: Goal: Ability to describe self-care measures that may prevent or decrease complications (Diabetes Survival Skills Education) will improve Outcome: Progressing Goal: Individualized Educational Video(s) Outcome: Progressing   Problem: Coping: Goal: Ability to adjust to condition or change in health will improve Outcome: Progressing   Problem: Fluid  Volume: Goal: Ability to maintain a balanced intake and output will improve Outcome: Progressing   Problem: Health Behavior/Discharge Planning: Goal: Ability to identify and utilize available resources and services will improve Outcome: Progressing Goal: Ability to manage health-related needs will improve Outcome: Progressing   Problem: Metabolic: Goal: Ability to maintain appropriate glucose levels will improve Outcome: Progressing   Problem: Nutritional: Goal: Maintenance of adequate nutrition will improve Outcome: Progressing Goal: Progress toward achieving an optimal weight will improve Outcome: Progressing   Problem: Skin Integrity: Goal: Risk for impaired skin integrity will decrease Outcome: Progressing   Problem: Tissue Perfusion: Goal: Adequacy of tissue perfusion will improve Outcome: Progressing   Problem: Education: Goal: Ability to describe self-care measures that may prevent or decrease complications (Diabetes Survival Skills Education) will improve Outcome: Progressing Goal: Individualized Educational Video(s) Outcome: Progressing   Problem: Coping: Goal: Ability to adjust to condition or change in health will improve Outcome: Progressing   Problem: Fluid Volume: Goal: Ability to maintain a balanced intake and output will improve Outcome: Progressing   Problem: Health Behavior/Discharge Planning: Goal: Ability to identify and utilize available resources and services will improve Outcome: Progressing Goal: Ability to manage health-related needs will improve Outcome: Progressing   Problem: Metabolic: Goal: Ability to maintain appropriate glucose levels will improve Outcome: Progressing   Problem: Nutritional: Goal: Maintenance of adequate nutrition will improve Outcome: Progressing Goal: Progress toward achieving an optimal weight will improve Outcome: Progressing   Problem: Skin Integrity: Goal: Risk for impaired skin integrity will  decrease Outcome: Progressing   Problem: Tissue Perfusion: Goal: Adequacy of tissue perfusion will improve Outcome: Progressing   Problem: Education: Goal: Ability to demonstrate management of disease process will improve Outcome: Progressing Goal: Ability to verbalize understanding of medication therapies will improve Outcome: Progressing Goal: Individualized Educational Video(s) Outcome: Progressing   Problem: Activity: Goal: Capacity to carry out activities will improve Outcome: Progressing   Problem: Cardiac: Goal: Ability to achieve and maintain adequate cardiopulmonary perfusion will improve Outcome: Progressing   Problem: Education: Goal: Knowledge of  disease or condition will improve Outcome: Progressing Goal: Understanding of medication regimen will improve Outcome: Progressing Goal: Individualized Educational Video(s) Outcome: Progressing

## 2024-02-24 DIAGNOSIS — K5289 Other specified noninfective gastroenteritis and colitis: Secondary | ICD-10-CM | POA: Diagnosis not present

## 2024-02-24 LAB — HEMOGLOBIN AND HEMATOCRIT, BLOOD
HCT: 25.4 % — ABNORMAL LOW (ref 36.0–46.0)
Hemoglobin: 8 g/dL — ABNORMAL LOW (ref 12.0–15.0)

## 2024-02-24 LAB — GLUCOSE, CAPILLARY
Glucose-Capillary: 106 mg/dL — ABNORMAL HIGH (ref 70–99)
Glucose-Capillary: 200 mg/dL — ABNORMAL HIGH (ref 70–99)
Glucose-Capillary: 117 mg/dL — ABNORMAL HIGH (ref 70–99)
Glucose-Capillary: 170 mg/dL — ABNORMAL HIGH (ref 70–99)

## 2024-02-24 MED ORDER — DARBEPOETIN ALFA 200 MCG/0.4ML IJ SOSY
200.0000 ug | PREFILLED_SYRINGE | Freq: Once | INTRAMUSCULAR | Status: AC
  Administered 2024-02-24: 200 ug via SUBCUTANEOUS
  Filled 2024-02-24: qty 0.4

## 2024-02-24 NOTE — Progress Notes (Signed)
 I will reach again today by primary team as the patient has presented slow progressive downtrend of her hemoglobin, with most recent hemoglobin today of 8.0.  No recent overt gastrointestinal bleeding per notes but patient has presented progressive declining hemoglobin.  I discussed again the case with Dr. Kendell (anesthesiologist) regarding the candidacy of the patient to undergo esophagogastroduodenospy during current hospitalization at [facility].  Patient had history of known severe pulmonary hypertension, documented in TTE on 2024 (pressures of 67.6 mmHg).  Overall, the patient is a very high risk to undergo sedation, even if minimal.  It was considered that given the comorbidities of the patient, the risks are higher than the benefits for undergoing sedation.  If she were to have upper gastrointestinal bleeding, can consider performing endoscopic evaluation under minimal sedation at a tertiary level of care.  This was discussed with the primary team as well.

## 2024-02-24 NOTE — Progress Notes (Signed)
 PROGRESS NOTE  Maria Pace, is a 83 y.o. female, DOB - 04/13/1940, FMW:[id number]  Admit date - 02/16/2024   Admitting Physician Brayton GORMAN Lye, MD  Outpatient Primary MD for the patient is Pace, Chrystal, NP  LOS - 8  Brief Narrative:    83 y.o. female,    with medical history significant of bipolar 1 disorder, schizophrenia, essential hyprtension, GERD, hyperlipidemia, history of irregular heartbeat, type 2 diabetes mellitus, PAF on apixaban , HFrEF (EF 30-35%), HTN, patient with hospitalization last July, due to acute anemia from GI bleed, for which she refused endoscopy/colonoscopy . - Was sent from her group home as she was found blood with large amount of diarrhea, dark stools, poor historian due to schizophrenia, she is answering question appropriately but cannot give exact details, she had anemia in July, refused GI evaluation, unfortunately there is no medication list by the patient, by by reviewing dispense record it does appear she is not anymore on her metoprolol  or Eliquis .   Patient has been admitted for stercoral colitis with manual disimpaction performed in the ED.  She remains on IV Zosyn  and has been seen by GI with plans to advance diet as tolerated as well as aggressive bowel regimen.  She has previously refused any endoscopy and plans are for supportive care as well as potential SNF on discharge.  Transferred to stepdown unit 11/23 for closer blood pressure monitoring with low readings noted this a.m. likely from inaccurate blood pressure readings.  Started on IV fluid for AKI.  She has been taken off of norepinephrine  early this morning and IV fluids have been held.  NB!! Patient remains medically stable for discharge to SNF facility, unless patient improves enough to meet group home level of care- --patient Sister is now patient's legal guardian   -Assessment and Plan: 1)Sepsis, POA due to Sterocoral colitis Hemoccult positive stool -Occult positive stool, but  hemoglobin is stable, had manual disimpaction in ED, impacted stool was brown in color, no melena or bright red blood per rectum, followed by diarrhea dark gray in color (is on iron  tablets), no melena, Hemoccult positive stool most likely due to colitis and rectal trauma from manual disimpaction. -Blood cultures NGTD - treated with IV Zosyn --overall much improved WBC is down to 5.3 from 11.0, transitioned to oral Augmentin  on 02/22/2024, last dose 02/25/2024 - Continue on laxative regimen -PTA aspirin  remains on hold - Appreciate GI recommendations to conservatively manage and follow CBC and continue PPI twice daily as well as laxatives -CTA GI bleeding protocol is negative -Anesthesia and GI team reluctant to do colonoscopy due to concerns about cardiovascular status with low EF and pulmonary hypertension in the setting of relatively stable Hgb -Appreciate palliative consultation for goals of care with guardianship pending.  Continue full scope of care for now per discussion.  TOC working on SNF placement. - Cortisol 14.5 and 2D echocardiogram with low EF, but similar to prior. - Patient has been weaned off Levophed  as early hours of of 02/20/2024  - BP stable on midodrine   2)Acute on chronic Anemia--Hgb drifting down slowly ,  Hgb 10.2 >>9.3 >>9.2 >>8.5 >>8.0 -No obvious bleeding at this time -As per GI team anesthesia is reluctant to perform endoscopy here at [facility] due to severe pulmonary hypertension and congestive heart failure concerns with low EF- --if endoscopy is required patient may have to transfer to [facility] --Underlying chronic anemia may be partly due to CKD -Transfuse as clinically indicated -Prior anemia workup revealed that folate and  B12 were not low -Ferritin was low at 10 on 10/20/2022 -Continue iron  with multivitamin supplementation   3)Chronic HFrEF - 09/26/2023 echo EF 30-35%, normal RVF, moderate MR Repeat Echo on 02/19/24--- EF is still 30 to 35%,  -left  Ventricle: No LV thrombus by Definity . Left ventricular ejection fraction, by estimation, is 30 to 35%. The left ventricle has moderately decreased function. The left ventricle demonstrates regional wall motion abnormalities.  There is no left ventricular hypertrophy. Abnormal (paradoxical) septal motion, consistent with left bundle branch block.   LV Wall Scoring:  The entire anterior wall and anterior septum are akinetic. The inferior  septum, entire inferior wall, and posterior wall are hypokinetic. The apex  is normal.  -Pressure is soft we will hold on Entresto  and Aldactone  - restarted Toprol -XL at 12.5 mg twice daily-monitor BP   Paroxysmal atrial fibrillation -- Continue with amiodarone  -By reviewing records it does appear she has not been on Eliquis  for few months currently, and I would assume this is most likely in the setting of her previous anemia due to GI bleed and previously refusing anemia workup as it does appear risks outweighed benefits anticoagulation -restarted Toprol -XL at 12.5 mg twice daily-monitor BP   Schizophrenia, unspecified type - Behavioral issues improving -- continue olanzapine  - Continue Depakote  - Patient's sister is now her legal guardian   AKI on CKD stage IIIb with hyponatremia and metabolic acidosis -Likely in the setting of hypotension  - Creatinine is down to 1.2 from 2.73 on 02/19/2024 -- renally adjust medications, avoid nephrotoxic agents / dehydration  / hypotension    Diabetes mellitus type 2, controlled - A1c--5.2  -hold metformin  Use Novolog /Humalog Sliding scale insulin  with Accu-Cheks/Fingersticks as ordered    Morbid obesity-class III -Low calorie diet, portion control and increase physical activity discussed with patient -Body mass index is 43.44 kg/m.  Hypothyroidism-recheck TSH 8.64 from 47.0 on 10/26/2023 - levothyroxine  increased to 75 mcg daily  Disposition--patient originally from group home, however due to lack of  cooperation with care and requiring higher level of care group home unable to take her back -Patient remains medically stable for discharge to SNF facility, unless patient improves enough to meet group home level of care- --patient Sister is now patient's legal guardian -- Social/ethics--palliative care consult appreciated remains a full code without limitations to treatment - Guardianship initiated  -Acute urinary retention--Foley removed a.m. of 02/21/2024 --Patient is voiding well, continue Flomax   Status is: Inpatient   Disposition: The patient is from: Group home              Anticipated d/c is to: SNF              Anticipated d/c date is: 1 day              Patient currently is medically stable to d/c. Barriers: Not Clinically Stable-   Code Status :  -  Code Status: Full Code   Family Communication:    Sister [name] Baptist -[phone] --is primary contact DVT Prophylaxis  :   - SCDs  SCDs Start: 02/16/24 2111   Lab Results  Component Value Date   PLT 191 02/22/2024   Inpatient Medications  Scheduled Meds:  amiodarone   200 mg Oral Daily   amoxicillin -clavulanate  1 tablet Oral Q12H   Chlorhexidine  Gluconate Cloth  6 each Topical Daily   divalproex   250 mg Oral Daily   insulin  aspart  0-9 Units Subcutaneous TID AC & HS   latanoprost   1 drop Both Eyes QHS   levothyroxine   75 mcg Oral Q0600   liver oil-zinc  oxide   Topical Q0600   metoprolol  succinate  12.5 mg Oral BID   midodrine   10 mg Oral TID WC   multivitamins with iron   1 tablet Oral Daily   OLANZapine   20 mg Oral QHS   OLANZapine   5 mg Oral QHS   pantoprazole   40 mg Oral BID   polyethylene glycol  17 g Oral Daily   senna-docusate  1 tablet Oral BID   sodium bicarbonate   650 mg Oral BID   tamsulosin   0.4 mg Oral QPC supper   Continuous Infusions:   PRN Meds:.acetaminophen  **OR** acetaminophen , albuterol , HYDROcodone -acetaminophen , ondansetron  **OR** ondansetron  (ZOFRAN ) IV, mouth rinse, polyethylene  glycol   Anti-infectives (From admission, onward)    Start     Dose/Rate Route Frequency Ordered Stop   02/21/24 2200  amoxicillin -clavulanate (AUGMENTIN ) 875-125 MG per tablet 1 tablet        1 tablet Oral Every 12 hours 02/21/24 1755 02/25/24 2359   02/20/24 0815  piperacillin -tazobactam (ZOSYN ) IVPB 3.375 g  Status:  Discontinued        3.375 g 12.5 mL/hr over 240 Minutes Intravenous Every 8 hours 02/20/24 0800 02/21/24 1755   02/18/24 2200  piperacillin -tazobactam (ZOSYN ) IVPB 3.375 g  Status:  Discontinued        3.375 g 12.5 mL/hr over 240 Minutes Intravenous Every 12 hours 02/18/24 1352 02/20/24 0800   02/17/24 0400  piperacillin -tazobactam (ZOSYN ) IVPB 3.375 g  Status:  Discontinued        3.375 g 12.5 mL/hr over 240 Minutes Intravenous Every 8 hours 02/16/24 2126 02/18/24 1352   02/16/24 2000  piperacillin -tazobactam (ZOSYN ) IVPB 3.375 g        3.375 g 100 mL/hr over 30 Minutes Intravenous  Once 02/16/24 1949 02/17/24 1200       Subjective: Naomie Hurst today has no fevers, no emesis,  No chest pain,    No new complaints or concerns - Eating and drinking well  Patient remains medically stable for discharge to SNF facility, unless patient improves enough to meet group home level of care- --patient Sister is now patient's legal guardian  Objective: Vitals:   02/23/24 0838 02/23/24 1753 02/23/24 1912 02/24/24 0303  BP: 130/75 126/65 123/72 (!) 106/53  Pulse: 96 71 78 66  Resp:  16 17 17   Temp:  98.4 F (36.9 C) 98 F (36.7 C) 98.4 F (36.9 C)  TempSrc:  Oral Oral   SpO2:  93% 97% 100%  Weight:      Height:        Intake/Output Summary (Last 24 hours) at 02/24/2024 1252 Last data filed at 02/24/2024 1215 Gross per 24 hour  Intake 960 ml  Output 2100 ml  Net -1140 ml   Filed Weights   02/18/24 0758 02/19/24 0445 02/21/24 0936  Weight: 99.4 kg 100.9 kg 100.9 kg    Physical Exam  Gen:- Awake Alert, no acute distress HEENT:- [place].AT, No sclera  icterus Neck-Supple Neck,No JVD,.  Lungs-  CTAB , fair symmetrical air movement CV- S1, S2 normal, irregular  Abd-  +ve B.Sounds, Abd Soft, No tenderness,    Extremity/Skin:-Improved edema, pedal pulses present  Psych-affect is appropriate, oriented x3, More cooperative, more calm, Neuro-generalized weakness, no new focal deficits, no tremors  Data Reviewed: I have personally reviewed following labs and imaging studies  CBC: Recent Labs  Lab 02/18/24 0521 02/19/24 0435 02/20/24 0619 02/21/24 0527 02/22/24  9471 02/23/24 0421 02/24/24 0236  WBC 10.3 11.0* 8.4 5.3 6.0  --   --   HGB 9.5* 9.4* 10.2* 9.3* 9.2* 8.5* 8.0*  HCT 29.7* 28.9* 31.7* 29.0* 28.8* 26.0* 25.4*  MCV 93.1 91.2 93.2 91.2 92.6  --   --   PLT 183 202 197 199 191  --   --    Basic Metabolic Panel: Recent Labs  Lab 02/18/24 0521 02/19/24 0435 02/20/24 0619 02/21/24 0527 02/23/24 0421  NA 131* 132* 135 137 137  K 4.4 4.3 4.1 4.2 4.3  CL 96* 98 107 106 104  CO2 21* 21* 19* 22 25  GLUCOSE 116* 86 101* 73 96  BUN 41* 40* 29* 17 14  CREATININE 2.55* 2.73* 1.68* 1.25* 1.22*  CALCIUM  8.1* 8.0* 8.2* 8.4* 8.5*  MG 1.7 2.1 2.0 1.7  --   PHOS  --   --   --   --  2.5   GFR: Estimated Creatinine Clearance: 38.7 mL/min (A) (by C-G formula based on SCr of 1.22 mg/dL (H)). Liver Function Tests: Recent Labs  Lab 02/23/24 0421  ALBUMIN 2.6*   Recent Results (from the past 240 hours)  Culture, blood (routine x 2)     Status: None   Collection Time: 02/16/24  8:25 PM   Specimen: BLOOD  Result Value Ref Range Status   Specimen Description BLOOD LEFT ANTECUBITAL  Final   Special Requests   Final    BOTTLES DRAWN AEROBIC ONLY Blood Culture adequate volume   Culture   Final    NO GROWTH 5 DAYS Performed at [facility], [address] [place], [place] [address]    Report Status 02/21/2024 FINAL  Final  Culture, blood (routine x 2)     Status: None   Collection Time: 02/16/24  8:34 PM   Specimen: BLOOD  Result  Value Ref Range Status   Specimen Description BLOOD RIGHT ANTECUBITAL  Final   Special Requests   Final    BOTTLES DRAWN AEROBIC AND ANAEROBIC Blood Culture adequate volume   Culture   Final    NO GROWTH 5 DAYS Performed at [facility], [address], [place], [place] [address]    Report Status 02/21/2024 FINAL  Final  MRSA Next Gen by PCR, Nasal     Status: None   Collection Time: 02/18/24  8:10 AM   Specimen: Nasal Mucosa; Nasal Swab  Result Value Ref Range Status   MRSA by PCR Next Gen NOT DETECTED NOT DETECTED Final    Comment: (NOTE) The GeneXpert MRSA Assay (FDA approved for NASAL specimens only), is one component of a comprehensive MRSA colonization surveillance program. It is not intended to diagnose MRSA infection nor to guide or monitor treatment for MRSA infections. Test performance is not FDA approved in patients less than 20 years old. Performed at [facility], [address], [place], [place] [address]     Radiology Studies: No results found.  Scheduled Meds:  amiodarone   200 mg Oral Daily   amoxicillin -clavulanate  1 tablet Oral Q12H   Chlorhexidine  Gluconate Cloth  6 each Topical Daily   divalproex   250 mg Oral Daily   insulin  aspart  0-9 Units Subcutaneous TID AC & HS   latanoprost   1 drop Both Eyes QHS   levothyroxine   75 mcg Oral Q0600   liver oil-zinc  oxide   Topical Q0600   metoprolol  succinate  12.5 mg Oral BID   midodrine   10 mg Oral TID WC   multivitamins with  iron   1 tablet Oral Daily   OLANZapine   20 mg Oral QHS   OLANZapine   5 mg Oral QHS   pantoprazole   40 mg Oral BID   polyethylene glycol  17 g Oral Daily   senna-docusate  1 tablet Oral BID   sodium bicarbonate   650 mg Oral BID   tamsulosin   0.4 mg Oral QPC supper   Continuous Infusions:   LOS: 8 days   Rendall Carwin M.D on 02/24/2024 at 12:52 PM  Go to [email or web address] - for contact info  [facility] - Office  [phone]  If 7PM-7AM, please contact  night-coverage [email or web address] 02/24/2024, 12:52 PM

## 2024-02-24 NOTE — Plan of Care (Signed)
 Problem: Education: Goal: Knowledge of General Education information will improve Description: Including pain rating scale, medication(s)/side effects and non-pharmacologic comfort measures Outcome: Progressing   Problem: Health Behavior/Discharge Planning: Goal: Ability to manage health-related needs will improve Outcome: Progressing   Problem: Clinical Measurements: Goal: Ability to maintain clinical measurements within normal limits will improve Outcome: Progressing Goal: Will remain free from infection Outcome: Progressing Goal: Diagnostic test results will improve Outcome: Progressing Goal: Respiratory complications will improve Outcome: Progressing Goal: Cardiovascular complication will be avoided Outcome: Progressing   Problem: Activity: Goal: Risk for activity intolerance will decrease Outcome: Progressing   Problem: Nutrition: Goal: Adequate nutrition will be maintained Outcome: Progressing   Problem: Coping: Goal: Level of anxiety will decrease Outcome: Progressing   Problem: Elimination: Goal: Will not experience complications related to bowel motility Outcome: Progressing Goal: Will not experience complications related to urinary retention Outcome: Progressing   Problem: Pain Managment: Goal: General experience of comfort will improve and/or be controlled Outcome: Progressing   Problem: Safety: Goal: Ability to remain free from injury will improve Outcome: Progressing   Problem: Skin Integrity: Goal: Risk for impaired skin integrity will decrease Outcome: Progressing   Problem: Education: Goal: Ability to describe self-care measures that may prevent or decrease complications (Diabetes Survival Skills Education) will improve Outcome: Progressing Goal: Individualized Educational Video(s) Outcome: Progressing   Problem: Coping: Goal: Ability to adjust to condition or change in health will improve Outcome: Progressing   Problem: Fluid  Volume: Goal: Ability to maintain a balanced intake and output will improve Outcome: Progressing   Problem: Health Behavior/Discharge Planning: Goal: Ability to identify and utilize available resources and services will improve Outcome: Progressing Goal: Ability to manage health-related needs will improve Outcome: Progressing   Problem: Metabolic: Goal: Ability to maintain appropriate glucose levels will improve Outcome: Progressing   Problem: Nutritional: Goal: Maintenance of adequate nutrition will improve Outcome: Progressing Goal: Progress toward achieving an optimal weight will improve Outcome: Progressing   Problem: Skin Integrity: Goal: Risk for impaired skin integrity will decrease Outcome: Progressing   Problem: Tissue Perfusion: Goal: Adequacy of tissue perfusion will improve Outcome: Progressing   Problem: Education: Goal: Ability to describe self-care measures that may prevent or decrease complications (Diabetes Survival Skills Education) will improve Outcome: Progressing Goal: Individualized Educational Video(s) Outcome: Progressing   Problem: Coping: Goal: Ability to adjust to condition or change in health will improve Outcome: Progressing   Problem: Fluid Volume: Goal: Ability to maintain a balanced intake and output will improve Outcome: Progressing   Problem: Health Behavior/Discharge Planning: Goal: Ability to identify and utilize available resources and services will improve Outcome: Progressing Goal: Ability to manage health-related needs will improve Outcome: Progressing   Problem: Metabolic: Goal: Ability to maintain appropriate glucose levels will improve Outcome: Progressing   Problem: Nutritional: Goal: Maintenance of adequate nutrition will improve Outcome: Progressing Goal: Progress toward achieving an optimal weight will improve Outcome: Progressing   Problem: Skin Integrity: Goal: Risk for impaired skin integrity will  decrease Outcome: Progressing   Problem: Tissue Perfusion: Goal: Adequacy of tissue perfusion will improve Outcome: Progressing   Problem: Education: Goal: Ability to demonstrate management of disease process will improve Outcome: Progressing Goal: Ability to verbalize understanding of medication therapies will improve Outcome: Progressing Goal: Individualized Educational Video(s) Outcome: Progressing   Problem: Activity: Goal: Capacity to carry out activities will improve Outcome: Progressing   Problem: Cardiac: Goal: Ability to achieve and maintain adequate cardiopulmonary perfusion will improve Outcome: Progressing   Problem: Education: Goal: Knowledge of  disease or condition will improve Outcome: Progressing Goal: Understanding of medication regimen will improve Outcome: Progressing Goal: Individualized Educational Video(s) Outcome: Progressing

## 2024-02-25 DIAGNOSIS — K5289 Other specified noninfective gastroenteritis and colitis: Secondary | ICD-10-CM | POA: Diagnosis not present

## 2024-02-25 LAB — GLUCOSE, CAPILLARY
Glucose-Capillary: 112 mg/dL — ABNORMAL HIGH (ref 70–99)
Glucose-Capillary: 161 mg/dL — ABNORMAL HIGH (ref 70–99)
Glucose-Capillary: 242 mg/dL — ABNORMAL HIGH (ref 70–99)
Glucose-Capillary: 160 mg/dL — ABNORMAL HIGH (ref 70–99)

## 2024-02-25 LAB — IRON AND TIBC
Iron: 64 ug/dL (ref 28–170)
Saturation Ratios: 20 % (ref 10.4–31.8)
TIBC: 321 ug/dL (ref 250–450)
UIBC: 257 ug/dL

## 2024-02-25 LAB — HEMOGLOBIN AND HEMATOCRIT, BLOOD
HCT: 27.2 % — ABNORMAL LOW (ref 36.0–46.0)
Hemoglobin: 8.6 g/dL — ABNORMAL LOW (ref 12.0–15.0)

## 2024-02-25 LAB — FERRITIN: Ferritin: 74 ng/mL (ref 11–307)

## 2024-02-25 NOTE — Progress Notes (Signed)
 PROGRESS NOTE  Maria Pace, is a 83 y.o. female, DOB - [birth year], FMW:[id number]  Admit date - 02/16/2024   Admitting Physician Brayton GORMAN Lye, MD  Outpatient Primary MD for the patient is Gammon, Chrystal, NP  LOS - 9  Brief Narrative:    83 y.o. female,    with medical history significant of bipolar 1 disorder, schizophrenia, essential hyprtension, GERD, hyperlipidemia, history of irregular heartbeat, type 2 diabetes mellitus, PAF on apixaban , HFrEF (EF 30-35%), HTN, patient with hospitalization last July, due to acute anemia from GI bleed, for which she refused endoscopy/colonoscopy . - Was sent from her group home as she was found blood with large amount of diarrhea, dark stools, poor historian due to schizophrenia, she is answering question appropriately but cannot give exact details, she had anemia in July, refused GI evaluation, unfortunately there is no medication list by the patient, by by reviewing dispense record it does appear she is not anymore on her metoprolol  or Eliquis .   Patient has been admitted for stercoral colitis with manual disimpaction performed in the ED.  She remains on IV Zosyn  and has been seen by GI with plans to advance diet as tolerated as well as aggressive bowel regimen.  She has previously refused any endoscopy and plans are for supportive care as well as potential SNF on discharge.  Transferred to stepdown unit 11/23 for closer blood pressure monitoring with low readings noted this a.m. likely from inaccurate blood pressure readings.  Started on IV fluid for AKI.  She has been taken off of norepinephrine  early this morning and IV fluids have been held.  NB!! Patient remains medically stable for discharge to SNF facility, unless patient improves enough to meet group home level of care- --patient Sister is now patient's legal guardian   -Assessment and Plan: 1)Sepsis, POA due to Sterocoral colitis Hemoccult positive stool -Occult positive stool, but  hemoglobin is stable, had manual disimpaction in ED, impacted stool was brown in color, no melena or bright red blood per rectum, followed by diarrhea dark gray in color (is on iron  tablets), no melena, Hemoccult positive stool most likely due to colitis and rectal trauma from manual disimpaction. -Blood cultures NGTD - treated with IV Zosyn --overall much improved WBC is down to 5.3 from 11.0, transitioned to oral Augmentin  on 02/22/2024, last dose 02/25/2024 - Continue on laxative regimen -PTA aspirin  remains on hold - Appreciate GI recommendations to conservatively manage and follow CBC and continue PPI twice daily as well as laxatives -CTA GI bleeding protocol is negative -Anesthesia and GI team reluctant to do colonoscopy due to concerns about cardiovascular status with low EF and pulmonary hypertension in the setting of relatively stable Hgb -Appreciate palliative consultation for goals of care with guardianship pending.  Continue full scope of care for now per discussion.  TOC working on SNF placement. - Cortisol 14.5 and 2D echocardiogram with low EF, but similar to prior. - Patient has been weaned off Levophed  as early hours of of 02/20/2024  - BP stable on midodrine   2)Acute on chronic Anemia--Hgb drifting down slowly ,  Hgb 10.2 >>9.3 >>9.2 >>8.5 >>8.0 -No obvious bleeding at this time -As per GI team anesthesia is reluctant to perform endoscopy here at [facility] due to severe pulmonary hypertension and congestive heart failure concerns with low EF- --if endoscopy is required patient may have to transfer to [facility] --Underlying chronic anemia may be partly due to CKD -Transfuse as clinically indicated -Prior anemia workup revealed that folate and  B12 were not low -Ferritin was low at 10 on 10/20/2022 -Continue iron  with multivitamin supplementation   3)Chronic HFrEF - 09/26/2023 echo EF 30-35%, normal RVF, moderate MR Repeat Echo on 02/19/24--- EF is still 30 to 35%,  -left  Ventricle: No LV thrombus by Definity . Left ventricular ejection fraction, by estimation, is 30 to 35%. The left ventricle has moderately decreased function. The left ventricle demonstrates regional wall motion abnormalities.  There is no left ventricular hypertrophy. Abnormal (paradoxical) septal motion, consistent with left bundle branch block.   LV Wall Scoring:  The entire anterior wall and anterior septum are akinetic. The inferior  septum, entire inferior wall, and posterior wall are hypokinetic. The apex  is normal.  -Pressure is soft we will hold on Entresto  and Aldactone  - restarted Toprol -XL at 12.5 mg twice daily-monitor BP   4)Paroxysmal atrial fibrillation -- Continue with amiodarone  -By reviewing records it does appear she has not been on Eliquis  for few months currently, and I would assume this is most likely in the setting of her previous anemia due to GI bleed and previously refusing anemia workup as it does appear risks outweighed benefits anticoagulation -restarted Toprol -XL at 12.5 mg twice daily-monitor BP   5)Schizophrenia, unspecified type - Behavioral issues improving -- continue olanzapine  - Continue Depakote  - Patient's sister is now her legal guardian   6)AKI on CKD stage IIIb with hyponatremia and metabolic acidosis -Likely in the setting of hypotension  - Creatinine is down to 1.2 from 2.73 on 02/19/2024 -- renally adjust medications, avoid nephrotoxic agents / dehydration  / hypotension    7)Diabetes mellitus type 2, controlled - A1c--5.2  -Hold Metformin  Use Novolog /Humalog Sliding scale insulin  with Accu-Cheks/Fingersticks as ordered    8)Morbid obesity-class III -Low calorie diet, portion control and increase physical activity discussed with patient -Body mass index is 43.44 kg/m.  9)Hypothyroidism-recheck TSH 8.64 from 47.0 on 10/26/2023 - levothyroxine  increased to 75 mcg daily  10)Acute urinary retention--Foley removed a.m. of  02/21/2024 --Patient is voiding well, continue Flomax   Disposition--patient originally from group home, however due to lack of cooperation with care and requiring higher level of care group home unable to take her back -Patient remains medically stable for discharge to SNF facility, unless patient improves enough to meet group home level of care- --patient Sister is now patient's legal guardian -- Social/ethics--palliative care consult appreciated remains a full code without limitations to treatment - Guardianship initiated  Status is: Inpatient   Disposition: The patient is from: Group home              Anticipated d/c is to: SNF              Anticipated d/c date is: 1 day              Patient currently is medically stable to d/c. Barriers: Not Clinically Stable-   Code Status :  -  Code Status: Full Code   Family Communication:    Sister [name] Baptist -[phone] --is primary contact DVT Prophylaxis  :   - SCDs  SCDs Start: 02/16/24 2111   Lab Results  Component Value Date   PLT 191 02/22/2024   Inpatient Medications  Scheduled Meds:  amiodarone   200 mg Oral Daily   amoxicillin -clavulanate  1 tablet Oral Q12H   Chlorhexidine  Gluconate Cloth  6 each Topical Daily   divalproex   250 mg Oral Daily   insulin  aspart  0-9 Units Subcutaneous TID AC & HS   latanoprost   1 drop Both Eyes QHS   levothyroxine   75 mcg Oral Q0600   liver oil-zinc  oxide   Topical Q0600   metoprolol  succinate  12.5 mg Oral BID   midodrine   10 mg Oral TID WC   multivitamins with iron   1 tablet Oral Daily   OLANZapine   20 mg Oral QHS   OLANZapine   5 mg Oral QHS   pantoprazole   40 mg Oral BID   polyethylene glycol  17 g Oral Daily   senna-docusate  1 tablet Oral BID   sodium bicarbonate   650 mg Oral BID   tamsulosin   0.4 mg Oral QPC supper   Continuous Infusions:   PRN Meds:.acetaminophen  **OR** acetaminophen , albuterol , HYDROcodone -acetaminophen , ondansetron  **OR** ondansetron  (ZOFRAN ) IV, mouth  rinse, polyethylene glycol   Anti-infectives (From admission, onward)    Start     Dose/Rate Route Frequency Ordered Stop   02/21/24 2200  amoxicillin -clavulanate (AUGMENTIN ) 875-125 MG per tablet 1 tablet        1 tablet Oral Every 12 hours 02/21/24 1755 02/25/24 2359   02/20/24 0815  piperacillin -tazobactam (ZOSYN ) IVPB 3.375 g  Status:  Discontinued        3.375 g 12.5 mL/hr over 240 Minutes Intravenous Every 8 hours 02/20/24 0800 02/21/24 1755   02/18/24 2200  piperacillin -tazobactam (ZOSYN ) IVPB 3.375 g  Status:  Discontinued        3.375 g 12.5 mL/hr over 240 Minutes Intravenous Every 12 hours 02/18/24 1352 02/20/24 0800   02/17/24 0400  piperacillin -tazobactam (ZOSYN ) IVPB 3.375 g  Status:  Discontinued        3.375 g 12.5 mL/hr over 240 Minutes Intravenous Every 8 hours 02/16/24 2126 02/18/24 1352   02/16/24 2000  piperacillin -tazobactam (ZOSYN ) IVPB 3.375 g        3.375 g 100 mL/hr over 30 Minutes Intravenous  Once 02/16/24 1949 02/17/24 1200      Subjective: Maria Pace today has no fevers, no emesis,  No chest pain,    02/25/24 No new complaints or concerns -Eating and drinking well---- Patient remains medically stable for discharge to SNF facility, unless patient improves enough to meet group home level of care- --patient Sister is now patient's legal guardian  Objective: Vitals:   02/24/24 1934 02/25/24 0445 02/25/24 0930 02/25/24 1244  BP: (!) 118/52 111/65 (!) 102/51 132/67  Pulse: 65 67 68 76  Resp: 18 20  18   Temp: 98.1 F (36.7 C) 98 F (36.7 C)  98.1 F (36.7 C)  TempSrc: Oral   Oral  SpO2: 93% 95%  96%  Weight:      Height:        Intake/Output Summary (Last 24 hours) at 02/25/2024 1755 Last data filed at 02/25/2024 1200 Gross per 24 hour  Intake 480 ml  Output 1600 ml  Net -1120 ml   Filed Weights   02/18/24 0758 02/19/24 0445 02/21/24 0936  Weight: 99.4 kg 100.9 kg 100.9 kg    Physical Exam Gen:- Awake Alert, no acute  distress HEENT:- [place].AT, No sclera icterus Neck-Supple Neck,No JVD,.  Lungs-  CTAB , fair symmetrical air movement CV- S1, S2 normal, irregular  Abd-  +ve B.Sounds, Abd Soft, No tenderness,    Extremity/Skin:-Improved edema, pedal pulses present  Psych-affect is appropriate, oriented x3, More cooperative, more calm, Neuro-generalized weakness, no new focal deficits, no tremors  Data Reviewed: I have personally reviewed following labs and imaging studies  CBC: Recent Labs  Lab 02/19/24 0435 02/20/24 0619 02/21/24 0527 02/22/24 0528 02/23/24 0421 02/24/24  0236 02/25/24 0442  WBC 11.0* 8.4 5.3 6.0  --   --   --   HGB 9.4* 10.2* 9.3* 9.2* 8.5* 8.0* 8.6*  HCT 28.9* 31.7* 29.0* 28.8* 26.0* 25.4* 27.2*  MCV 91.2 93.2 91.2 92.6  --   --   --   PLT 202 197 199 191  --   --   --    Basic Metabolic Panel: Recent Labs  Lab 02/19/24 0435 02/20/24 0619 02/21/24 0527 02/23/24 0421  NA 132* 135 137 137  K 4.3 4.1 4.2 4.3  CL 98 107 106 104  CO2 21* 19* 22 25  GLUCOSE 86 101* 73 96  BUN 40* 29* 17 14  CREATININE 2.73* 1.68* 1.25* 1.22*  CALCIUM  8.0* 8.2* 8.4* 8.5*  MG 2.1 2.0 1.7  --   PHOS  --   --   --  2.5   GFR: Estimated Creatinine Clearance: 38.7 mL/min (A) (by C-G formula based on SCr of 1.22 mg/dL (H)). Liver Function Tests: Recent Labs  Lab 02/23/24 0421  ALBUMIN 2.6*   Recent Results (from the past 240 hours)  Culture, blood (routine x 2)     Status: None   Collection Time: 02/16/24  8:25 PM   Specimen: BLOOD  Result Value Ref Range Status   Specimen Description BLOOD LEFT ANTECUBITAL  Final   Special Requests   Final    BOTTLES DRAWN AEROBIC ONLY Blood Culture adequate volume   Culture   Final    NO GROWTH 5 DAYS Performed at [facility], [address], [place], [place] [address]    Report Status 02/21/2024 FINAL  Final  Culture, blood (routine x 2)     Status: None   Collection Time: 02/16/24  8:34 PM   Specimen: BLOOD  Result Value Ref Range Status    Specimen Description BLOOD RIGHT ANTECUBITAL  Final   Special Requests   Final    BOTTLES DRAWN AEROBIC AND ANAEROBIC Blood Culture adequate volume   Culture   Final    NO GROWTH 5 DAYS Performed at [facility], [address] [place], [place] [address]    Report Status 02/21/2024 FINAL  Final  MRSA Next Gen by PCR, Nasal     Status: None   Collection Time: 02/18/24  8:10 AM   Specimen: Nasal Mucosa; Nasal Swab  Result Value Ref Range Status   MRSA by PCR Next Gen NOT DETECTED NOT DETECTED Final    Comment: (NOTE) The GeneXpert MRSA Assay (FDA approved for NASAL specimens only), is one component of a comprehensive MRSA colonization surveillance program. It is not intended to diagnose MRSA infection nor to guide or monitor treatment for MRSA infections. Test performance is not FDA approved in patients less than 76 years old. Performed at [facility], [address] [place], [place] [address]     Radiology Studies: No results found.  Scheduled Meds:  amiodarone   200 mg Oral Daily   amoxicillin -clavulanate  1 tablet Oral Q12H   Chlorhexidine  Gluconate Cloth  6 each Topical Daily   divalproex   250 mg Oral Daily   insulin  aspart  0-9 Units Subcutaneous TID AC & HS   latanoprost   1 drop Both Eyes QHS   levothyroxine   75 mcg Oral Q0600   liver oil-zinc  oxide   Topical Q0600   metoprolol  succinate  12.5 mg Oral BID   midodrine   10 mg Oral TID WC   multivitamins with iron   1 tablet Oral Daily   OLANZapine   20 mg Oral QHS   OLANZapine   5 mg Oral QHS   pantoprazole   40 mg Oral BID   polyethylene glycol  17 g Oral Daily   senna-docusate  1 tablet Oral BID   sodium bicarbonate   650 mg Oral BID   tamsulosin   0.4 mg Oral QPC supper   Continuous Infusions:   LOS: 9 days   Rendall Carwin M.D on 02/25/2024 at 5:55 PM  Go to [email or web address] - for contact info  [facility] - Office  [phone]  If 7PM-7AM, please contact night-coverage [email or web address] 02/25/2024, 5:55 PM

## 2024-02-25 NOTE — Plan of Care (Signed)
 Problem: Education: Goal: Knowledge of General Education information will improve Description: Including pain rating scale, medication(s)/side effects and non-pharmacologic comfort measures Outcome: Progressing   Problem: Health Behavior/Discharge Planning: Goal: Ability to manage health-related needs will improve Outcome: Progressing   Problem: Clinical Measurements: Goal: Ability to maintain clinical measurements within normal limits will improve Outcome: Progressing Goal: Will remain free from infection Outcome: Progressing Goal: Diagnostic test results will improve Outcome: Progressing Goal: Respiratory complications will improve Outcome: Progressing Goal: Cardiovascular complication will be avoided Outcome: Progressing   Problem: Activity: Goal: Risk for activity intolerance will decrease Outcome: Progressing   Problem: Nutrition: Goal: Adequate nutrition will be maintained Outcome: Progressing   Problem: Coping: Goal: Level of anxiety will decrease Outcome: Progressing   Problem: Elimination: Goal: Will not experience complications related to bowel motility Outcome: Progressing Goal: Will not experience complications related to urinary retention Outcome: Progressing   Problem: Pain Managment: Goal: General experience of comfort will improve and/or be controlled Outcome: Progressing   Problem: Safety: Goal: Ability to remain free from injury will improve Outcome: Progressing   Problem: Skin Integrity: Goal: Risk for impaired skin integrity will decrease Outcome: Progressing   Problem: Education: Goal: Ability to describe self-care measures that may prevent or decrease complications (Diabetes Survival Skills Education) will improve Outcome: Progressing Goal: Individualized Educational Video(s) Outcome: Progressing   Problem: Coping: Goal: Ability to adjust to condition or change in health will improve Outcome: Progressing   Problem: Fluid  Volume: Goal: Ability to maintain a balanced intake and output will improve Outcome: Progressing   Problem: Health Behavior/Discharge Planning: Goal: Ability to identify and utilize available resources and services will improve Outcome: Progressing Goal: Ability to manage health-related needs will improve Outcome: Progressing   Problem: Metabolic: Goal: Ability to maintain appropriate glucose levels will improve Outcome: Progressing   Problem: Nutritional: Goal: Maintenance of adequate nutrition will improve Outcome: Progressing Goal: Progress toward achieving an optimal weight will improve Outcome: Progressing   Problem: Skin Integrity: Goal: Risk for impaired skin integrity will decrease Outcome: Progressing   Problem: Tissue Perfusion: Goal: Adequacy of tissue perfusion will improve Outcome: Progressing   Problem: Education: Goal: Ability to describe self-care measures that may prevent or decrease complications (Diabetes Survival Skills Education) will improve Outcome: Progressing Goal: Individualized Educational Video(s) Outcome: Progressing   Problem: Coping: Goal: Ability to adjust to condition or change in health will improve Outcome: Progressing   Problem: Fluid Volume: Goal: Ability to maintain a balanced intake and output will improve Outcome: Progressing   Problem: Health Behavior/Discharge Planning: Goal: Ability to identify and utilize available resources and services will improve Outcome: Progressing Goal: Ability to manage health-related needs will improve Outcome: Progressing   Problem: Metabolic: Goal: Ability to maintain appropriate glucose levels will improve Outcome: Progressing   Problem: Nutritional: Goal: Maintenance of adequate nutrition will improve Outcome: Progressing Goal: Progress toward achieving an optimal weight will improve Outcome: Progressing   Problem: Skin Integrity: Goal: Risk for impaired skin integrity will  decrease Outcome: Progressing   Problem: Tissue Perfusion: Goal: Adequacy of tissue perfusion will improve Outcome: Progressing   Problem: Education: Goal: Ability to demonstrate management of disease process will improve Outcome: Progressing Goal: Ability to verbalize understanding of medication therapies will improve Outcome: Progressing Goal: Individualized Educational Video(s) Outcome: Progressing   Problem: Activity: Goal: Capacity to carry out activities will improve Outcome: Progressing   Problem: Cardiac: Goal: Ability to achieve and maintain adequate cardiopulmonary perfusion will improve Outcome: Progressing   Problem: Education: Goal: Knowledge of  disease or condition will improve Outcome: Progressing Goal: Understanding of medication regimen will improve Outcome: Progressing Goal: Individualized Educational Video(s) Outcome: Progressing

## 2024-02-25 NOTE — Plan of Care (Signed)
   Problem: Education: Goal: Knowledge of General Education information will improve Description: Including pain rating scale, medication(s)/side effects and non-pharmacologic comfort measures Outcome: Progressing   Problem: Activity: Goal: Risk for activity intolerance will decrease Outcome: Progressing   Problem: Elimination: Goal: Will not experience complications related to bowel motility Outcome: Progressing   Problem: Skin Integrity: Goal: Risk for impaired skin integrity will decrease Outcome: Progressing

## 2024-02-26 DIAGNOSIS — Z515 Encounter for palliative care: Secondary | ICD-10-CM | POA: Diagnosis not present

## 2024-02-26 DIAGNOSIS — K529 Noninfective gastroenteritis and colitis, unspecified: Secondary | ICD-10-CM | POA: Diagnosis not present

## 2024-02-26 DIAGNOSIS — K5289 Other specified noninfective gastroenteritis and colitis: Secondary | ICD-10-CM | POA: Diagnosis not present

## 2024-02-26 LAB — GLUCOSE, CAPILLARY
Glucose-Capillary: 85 mg/dL (ref 70–99)
Glucose-Capillary: 138 mg/dL — ABNORMAL HIGH (ref 70–99)
Glucose-Capillary: 117 mg/dL — ABNORMAL HIGH (ref 70–99)
Glucose-Capillary: 143 mg/dL — ABNORMAL HIGH (ref 70–99)

## 2024-02-26 NOTE — Progress Notes (Signed)
 PROGRESS NOTE  Maria Pace, is a 83 y.o. female, DOB - [birth year], FMW:[id number]  Admit date - 02/16/2024   Admitting Physician Brayton GORMAN Lye, MD  Outpatient Primary MD for the patient is Pace, Chrystal, NP  LOS - 10  Brief Narrative:    83 y.o. female,    with medical history significant of bipolar 1 disorder, schizophrenia, essential hyprtension, GERD, hyperlipidemia, history of irregular heartbeat, type 2 diabetes mellitus, PAF on apixaban , HFrEF (EF 30-35%), HTN, patient with hospitalization last July, due to acute anemia from GI bleed, for which she refused endoscopy/colonoscopy . - Was sent from her group home as she was found blood with large amount of diarrhea, dark stools, poor historian due to schizophrenia, she is answering question appropriately but cannot give exact details, she had anemia in July, refused GI evaluation, unfortunately there is no medication list by the patient, by by reviewing dispense record it does appear she is not anymore on her metoprolol  or Eliquis .   Patient has been admitted for stercoral colitis with manual disimpaction performed in the ED.  She remains on IV Zosyn  and has been seen by GI with plans to advance diet as tolerated as well as aggressive bowel regimen.  She has previously refused any endoscopy and plans are for supportive care as well as potential SNF on discharge.  Transferred to stepdown unit 11/23 for closer blood pressure monitoring with low readings noted this a.m. likely from inaccurate blood pressure readings.  Started on IV fluid for AKI.  She has been taken off of norepinephrine  early this morning and IV fluids have been held.  NB!! Patient remains medically stable for discharge to SNF facility, unless patient improves enough to meet group home level of care- --patient Sister is now patient's legal guardian   -Assessment and Plan: 1)Sepsis, POA due to Sterocoral colitis Hemoccult positive stool -Occult positive stool, but  hemoglobin is stable, had manual disimpaction in ED, impacted stool was brown in color, no melena or bright red blood per rectum, followed by diarrhea dark gray in color (is on iron  tablets), no melena, Hemoccult positive stool most likely due to colitis and rectal trauma from manual disimpaction. -Blood cultures NGTD - treated with IV Zosyn --overall much improved WBC is down to 5.3 from 11.0, transitioned to oral Augmentin  on 02/22/2024, last dose 02/25/2024 - Continue on laxative regimen -PTA aspirin  remains on hold - Appreciate GI recommendations to conservatively manage and follow CBC and continue PPI twice daily as well as laxatives -CTA GI bleeding protocol is negative -Anesthesia and GI team reluctant to do colonoscopy due to concerns about cardiovascular status with low EF and pulmonary hypertension in the setting of relatively stable Hgb -Appreciate palliative consultation for goals of care  .  Continue full scope of care for now per discussion.    - Cortisol 14.5 and 2D echocardiogram with low EF, but similar to prior. - Patient has been weaned off Levophed  as early hours of of 02/20/2024  - BP stable on midodrine   2)Acute on chronic Anemia--Hgb drifting down slowly ,  Hgb 10.2 >>9.3 >>9.2 >>8.5 >>8.0>>8.6 -No obvious bleeding at this time -As per GI team anesthesia is reluctant to perform endoscopy here at [facility] due to severe pulmonary hypertension and congestive heart failure concerns with low EF- --if endoscopy is required patient may have to transfer to [facility] --Underlying chronic anemia may be partly due to CKD -Transfuse as clinically indicated -Prior anemia workup revealed that folate and B12 were not low -  Ferritin was low at 10 on 10/20/2022, ferritin is now at 74 -Serum iron  is up to 64 from 26 from was ago and saturation is up to 20 from 7 4 months ago -Continue iron  with multivitamin supplementation   3)Chronic HFrEF - 09/26/2023 echo EF 30-35%, normal RVF,  moderate MR Repeat Echo on 02/19/24--- EF is still 30 to 35%,  -left Ventricle: No LV thrombus by Definity . Left ventricular ejection fraction, by estimation, is 30 to 35%. The left ventricle has moderately decreased function. The left ventricle demonstrates regional wall motion abnormalities.  There is no left ventricular hypertrophy. Abnormal (paradoxical) septal motion, consistent with left bundle branch block.   LV Wall Scoring:  The entire anterior wall and anterior septum are akinetic. The inferior  septum, entire inferior wall, and posterior wall are hypokinetic. The apex  is normal.  -Pressure is soft we will hold on Entresto  and Aldactone  - restarted Toprol -XL at 12.5 mg twice daily-monitor BP   4)Paroxysmal atrial fibrillation -- Continue with amiodarone  -By reviewing records it does appear she has not been on Eliquis  for few months currently, and I would assume this is most likely in the setting of her previous anemia due to GI bleed and previously refusing anemia workup as it does appear risks outweighed benefits anticoagulation -restarted Toprol -XL at 12.5 mg twice daily-monitor BP   5)Schizophrenia, unspecified type - Behavioral issues improving -- continue olanzapine  - Continue Depakote  - Patient's sister is now her legal guardian   6)AKI on CKD stage IIIb with hyponatremia and metabolic acidosis -Likely in the setting of hypotension  - Creatinine is down to 1.2 from 2.73 on 02/19/2024 -- renally adjust medications, avoid nephrotoxic agents / dehydration  / hypotension    7)Diabetes mellitus type 2, controlled - A1c--5.2  -Hold Metformin  Use Novolog /Humalog Sliding scale insulin  with Accu-Cheks/Fingersticks as ordered    8)Morbid obesity-class III -Low calorie diet, portion control and increase physical activity discussed with patient -Body mass index is 43.44 kg/m.  9)Hypothyroidism-recheck TSH 8.64 from 47.0 on 10/26/2023 - levothyroxine  increased to 75 mcg  daily  10)Acute urinary retention--Foley removed a.m. of 02/21/2024 --Patient is voiding well, continue Flomax   Disposition--patient originally from group home, however due to lack of cooperation with care and requiring higher level of care group home unable to take her back -Patient remains medically stable for discharge to SNF facility, unless patient improves enough to meet group home level of care- --patient Sister is now patient's legal guardian -- Social/ethics--palliative care consult appreciated remains a full code without limitations to treatment - Guardianship initiated  Status is: Inpatient   Disposition: The patient is from: Group home              Anticipated d/c is to: SNF              Anticipated d/c date is: 1 day              Patient currently is medically stable to d/c. Barriers: Not Clinically Stable-   Code Status :  -  Code Status: Full Code   Family Communication:    Sister [name] Baptist -[phone] --is primary contact DVT Prophylaxis  :   - SCDs  SCDs Start: 02/16/24 2111   Lab Results  Component Value Date   PLT 191 02/22/2024   Inpatient Medications  Scheduled Meds:  amiodarone   200 mg Oral Daily   Chlorhexidine  Gluconate Cloth  6 each Topical Daily   divalproex   250 mg Oral Daily  insulin  aspart  0-9 Units Subcutaneous TID AC & HS   latanoprost   1 drop Both Eyes QHS   levothyroxine   75 mcg Oral Q0600   liver oil-zinc  oxide   Topical Q0600   metoprolol  succinate  12.5 mg Oral BID   midodrine   10 mg Oral TID WC   multivitamins with iron   1 tablet Oral Daily   OLANZapine   20 mg Oral QHS   OLANZapine   5 mg Oral QHS   pantoprazole   40 mg Oral BID   polyethylene glycol  17 g Oral Daily   senna-docusate  1 tablet Oral BID   sodium bicarbonate   650 mg Oral BID   tamsulosin   0.4 mg Oral QPC supper   Continuous Infusions:   PRN Meds:.acetaminophen  **OR** acetaminophen , albuterol , HYDROcodone -acetaminophen , ondansetron  **OR** ondansetron   (ZOFRAN ) IV, mouth rinse, polyethylene glycol   Anti-infectives (From admission, onward)    Start     Dose/Rate Route Frequency Ordered Stop   02/21/24 2200  amoxicillin -clavulanate (AUGMENTIN ) 875-125 MG per tablet 1 tablet        1 tablet Oral Every 12 hours 02/21/24 1755 02/25/24 2130   02/20/24 0815  piperacillin -tazobactam (ZOSYN ) IVPB 3.375 g  Status:  Discontinued        3.375 g 12.5 mL/hr over 240 Minutes Intravenous Every 8 hours 02/20/24 0800 02/21/24 1755   02/18/24 2200  piperacillin -tazobactam (ZOSYN ) IVPB 3.375 g  Status:  Discontinued        3.375 g 12.5 mL/hr over 240 Minutes Intravenous Every 12 hours 02/18/24 1352 02/20/24 0800   02/17/24 0400  piperacillin -tazobactam (ZOSYN ) IVPB 3.375 g  Status:  Discontinued        3.375 g 12.5 mL/hr over 240 Minutes Intravenous Every 8 hours 02/16/24 2126 02/18/24 1352   02/16/24 2000  piperacillin -tazobactam (ZOSYN ) IVPB 3.375 g        3.375 g 100 mL/hr over 30 Minutes Intravenous  Once 02/16/24 1949 02/17/24 1200      Subjective: Naomie Hurst today has no fevers, no emesis,  No chest pain,    02/26/24 No new complaints or concerns -Eating and drinking well---- -ambulated with mobility specialist Patient remains medically stable for discharge to SNF facility, unless patient improves enough to meet group home level of care- --patient Sister is now patient's legal guardian  Objective: Vitals:   02/25/24 0930 02/25/24 1244 02/25/24 1900 02/26/24 1255  BP: (!) 102/51 132/67 134/77 (!) 99/56  Pulse: 68 76 69 65  Resp:  18 16 17   Temp:  98.1 F (36.7 C) 98.3 F (36.8 C) 98.2 F (36.8 C)  TempSrc:  Oral Oral   SpO2:  96% 96% 97%  Weight:      Height:        Intake/Output Summary (Last 24 hours) at 02/26/2024 1717 Last data filed at 02/26/2024 1200 Gross per 24 hour  Intake 600 ml  Output 850 ml  Net -250 ml   Filed Weights   02/18/24 0758 02/19/24 0445 02/21/24 0936  Weight: 99.4 kg 100.9 kg 100.9 kg     Physical Exam Gen:- Awake Alert, no acute distress HEENT:- [place].AT, No sclera icterus Neck-Supple Neck,No JVD,.  Lungs-  CTAB , fair symmetrical air movement CV- S1, S2 normal, irregular  Abd-  +ve B.Sounds, Abd Soft, No tenderness,    Extremity/Skin:-Improved edema, pedal pulses present  Psych-affect is appropriate, oriented x3, More cooperative, more calm, Neuro-generalized weakness, no new focal deficits, no tremors  Data Reviewed: I have personally reviewed following labs and imaging  studies  CBC: Recent Labs  Lab 02/20/24 0619 02/21/24 0527 02/22/24 0528 02/23/24 0421 02/24/24 0236 02/25/24 0442  WBC 8.4 5.3 6.0  --   --   --   HGB 10.2* 9.3* 9.2* 8.5* 8.0* 8.6*  HCT 31.7* 29.0* 28.8* 26.0* 25.4* 27.2*  MCV 93.2 91.2 92.6  --   --   --   PLT 197 199 191  --   --   --    Basic Metabolic Panel: Recent Labs  Lab 02/20/24 0619 02/21/24 0527 02/23/24 0421  NA 135 137 137  K 4.1 4.2 4.3  CL 107 106 104  CO2 19* 22 25  GLUCOSE 101* 73 96  BUN 29* 17 14  CREATININE 1.68* 1.25* 1.22*  CALCIUM  8.2* 8.4* 8.5*  MG 2.0 1.7  --   PHOS  --   --  2.5   GFR: Estimated Creatinine Clearance: 38.7 mL/min (A) (by C-G formula based on SCr of 1.22 mg/dL (H)). Liver Function Tests: Recent Labs  Lab 02/23/24 0421  ALBUMIN 2.6*   Recent Results (from the past 240 hours)  Culture, blood (routine x 2)     Status: None   Collection Time: 02/16/24  8:25 PM   Specimen: BLOOD  Result Value Ref Range Status   Specimen Description BLOOD LEFT ANTECUBITAL  Final   Special Requests   Final    BOTTLES DRAWN AEROBIC ONLY Blood Culture adequate volume   Culture   Final    NO GROWTH 5 DAYS Performed at [facility], [address], [place], [place] [address]    Report Status 02/21/2024 FINAL  Final  Culture, blood (routine x 2)     Status: None   Collection Time: 02/16/24  8:34 PM   Specimen: BLOOD  Result Value Ref Range Status   Specimen Description BLOOD RIGHT ANTECUBITAL   Final   Special Requests   Final    BOTTLES DRAWN AEROBIC AND ANAEROBIC Blood Culture adequate volume   Culture   Final    NO GROWTH 5 DAYS Performed at [facility], [address] [place], [place] [address]    Report Status 02/21/2024 FINAL  Final  MRSA Next Gen by PCR, Nasal     Status: None   Collection Time: 02/18/24  8:10 AM   Specimen: Nasal Mucosa; Nasal Swab  Result Value Ref Range Status   MRSA by PCR Next Gen NOT DETECTED NOT DETECTED Final    Comment: (NOTE) The GeneXpert MRSA Assay (FDA approved for NASAL specimens only), is one component of a comprehensive MRSA colonization surveillance program. It is not intended to diagnose MRSA infection nor to guide or monitor treatment for MRSA infections. Test performance is not FDA approved in patients less than 66 years old. Performed at [facility], [address] [place], [place] [address]     Radiology Studies: No results found.  Scheduled Meds:  amiodarone   200 mg Oral Daily   Chlorhexidine  Gluconate Cloth  6 each Topical Daily   divalproex   250 mg Oral Daily   insulin  aspart  0-9 Units Subcutaneous TID AC & HS   latanoprost   1 drop Both Eyes QHS   levothyroxine   75 mcg Oral Q0600   liver oil-zinc  oxide   Topical Q0600   metoprolol  succinate  12.5 mg Oral BID   midodrine   10 mg Oral TID WC   multivitamins with iron   1 tablet Oral Daily   OLANZapine   20 mg Oral QHS   OLANZapine   5 mg Oral  QHS   pantoprazole   40 mg Oral BID   polyethylene glycol  17 g Oral Daily   senna-docusate  1 tablet Oral BID   sodium bicarbonate   650 mg Oral BID   tamsulosin   0.4 mg Oral QPC supper   Continuous Infusions:   LOS: 10 days   Rendall Carwin M.D on 02/26/2024 at 5:17 PM  Go to [email or web address] - for contact info  [facility] - Office  [phone]  If 7PM-7AM, please contact night-coverage [email or web address] 02/26/2024, 5:17 PM

## 2024-02-26 NOTE — Plan of Care (Signed)
 Problem: Education: Goal: Knowledge of General Education information will improve Description: Including pain rating scale, medication(s)/side effects and non-pharmacologic comfort measures Outcome: Progressing   Problem: Health Behavior/Discharge Planning: Goal: Ability to manage health-related needs will improve Outcome: Progressing   Problem: Clinical Measurements: Goal: Ability to maintain clinical measurements within normal limits will improve Outcome: Progressing Goal: Will remain free from infection Outcome: Progressing Goal: Diagnostic test results will improve Outcome: Progressing Goal: Respiratory complications will improve Outcome: Progressing Goal: Cardiovascular complication will be avoided Outcome: Progressing   Problem: Activity: Goal: Risk for activity intolerance will decrease Outcome: Progressing   Problem: Nutrition: Goal: Adequate nutrition will be maintained Outcome: Progressing   Problem: Coping: Goal: Level of anxiety will decrease Outcome: Progressing   Problem: Elimination: Goal: Will not experience complications related to bowel motility Outcome: Progressing Goal: Will not experience complications related to urinary retention Outcome: Progressing   Problem: Pain Managment: Goal: General experience of comfort will improve and/or be controlled Outcome: Progressing   Problem: Safety: Goal: Ability to remain free from injury will improve Outcome: Progressing   Problem: Skin Integrity: Goal: Risk for impaired skin integrity will decrease Outcome: Progressing   Problem: Education: Goal: Ability to describe self-care measures that may prevent or decrease complications (Diabetes Survival Skills Education) will improve Outcome: Progressing Goal: Individualized Educational Video(s) Outcome: Progressing   Problem: Coping: Goal: Ability to adjust to condition or change in health will improve Outcome: Progressing   Problem: Fluid  Volume: Goal: Ability to maintain a balanced intake and output will improve Outcome: Progressing   Problem: Health Behavior/Discharge Planning: Goal: Ability to identify and utilize available resources and services will improve Outcome: Progressing Goal: Ability to manage health-related needs will improve Outcome: Progressing   Problem: Metabolic: Goal: Ability to maintain appropriate glucose levels will improve Outcome: Progressing   Problem: Nutritional: Goal: Maintenance of adequate nutrition will improve Outcome: Progressing Goal: Progress toward achieving an optimal weight will improve Outcome: Progressing   Problem: Skin Integrity: Goal: Risk for impaired skin integrity will decrease Outcome: Progressing   Problem: Tissue Perfusion: Goal: Adequacy of tissue perfusion will improve Outcome: Progressing   Problem: Education: Goal: Ability to describe self-care measures that may prevent or decrease complications (Diabetes Survival Skills Education) will improve Outcome: Progressing Goal: Individualized Educational Video(s) Outcome: Progressing   Problem: Coping: Goal: Ability to adjust to condition or change in health will improve Outcome: Progressing   Problem: Fluid Volume: Goal: Ability to maintain a balanced intake and output will improve Outcome: Progressing   Problem: Health Behavior/Discharge Planning: Goal: Ability to identify and utilize available resources and services will improve Outcome: Progressing Goal: Ability to manage health-related needs will improve Outcome: Progressing   Problem: Metabolic: Goal: Ability to maintain appropriate glucose levels will improve Outcome: Progressing   Problem: Nutritional: Goal: Maintenance of adequate nutrition will improve Outcome: Progressing Goal: Progress toward achieving an optimal weight will improve Outcome: Progressing   Problem: Skin Integrity: Goal: Risk for impaired skin integrity will  decrease Outcome: Progressing   Problem: Tissue Perfusion: Goal: Adequacy of tissue perfusion will improve Outcome: Progressing   Problem: Education: Goal: Ability to demonstrate management of disease process will improve Outcome: Progressing Goal: Ability to verbalize understanding of medication therapies will improve Outcome: Progressing Goal: Individualized Educational Video(s) Outcome: Progressing   Problem: Activity: Goal: Capacity to carry out activities will improve Outcome: Progressing   Problem: Cardiac: Goal: Ability to achieve and maintain adequate cardiopulmonary perfusion will improve Outcome: Progressing   Problem: Education: Goal: Knowledge of  disease or condition will improve Outcome: Progressing Goal: Understanding of medication regimen will improve Outcome: Progressing Goal: Individualized Educational Video(s) Outcome: Progressing

## 2024-02-26 NOTE — TOC Progression Note (Signed)
 Transition of Care [facility] - Progression Note    Patient Details  Name: Maria Pace MRN: [id number] Date of Birth: 08-22-1940  Transition [facility]) CM/SW Contact  Lucie Lunger, [place] Phone Number: 02/26/2024, 1:15 PM  Clinical Narrative:    CSW spoke with Randie from Group home to review if pt can return. Randie states she will need to come assess pt. CSW requested this be completed today, she will come after 3 today. CSW updated MD of this. If they are able to accept pt back then she will be able to return to group home tomorrow. TOC to follow.   Expected Discharge Plan: Group Home Barriers to Discharge: Continued Medical Work up               Expected Discharge Plan and Services In-house Referral: Clinical Social Work Discharge Planning Services: CM Consult Post Acute Care Choice: Home Health Living arrangements for the past 2 months: Group Home                                       Social Drivers of Health (SDOH) Interventions SDOH Screenings   Food Insecurity: No Food Insecurity (02/16/2024)  Housing: Low Risk  (02/16/2024)  Transportation Needs: No Transportation Needs (02/16/2024)  Recent Concern: Transportation Needs - Unmet Transportation Needs (02/16/2024)  Utilities: Not At Risk (02/16/2024)  Depression (PHQ2-9): High Risk (09/06/2021)  Social Connections: Unknown (02/16/2024)  Tobacco Use: Low Risk  (02/21/2024)    Readmission Risk Interventions    02/18/2024   10:48 AM 02/17/2024   12:13 PM 07/14/2022   11:16 AM  Readmission Risk Prevention Plan  Transportation Screening Complete Complete Complete  PCP or Specialist Appt within 5-7 Days   Not Complete  PCP or Specialist Appt within 3-5 Days Complete    Home Care Screening   Complete  Medication Review (RN CM)   Complete  HRI or Home Care Consult Complete Complete   Social Work Consult for Recovery Care Planning/Counseling Complete Complete   Palliative Care Screening Not  Applicable Not Applicable   Medication Review Oceanographer) Complete Complete

## 2024-02-26 NOTE — Progress Notes (Signed)
 Mobility Specialist Progress Note:    02/26/24 1340  Mobility  Activity Ambulated with assistance  Level of Assistance Minimal assist, patient does 75% or more  Assistive Device Front wheel walker  Distance Ambulated (ft) 20 ft  Range of Motion/Exercises Active;All extremities  Activity Response Tolerated well  Mobility Referral Yes  Mobility visit 1 Mobility  Mobility Specialist Start Time (ACUTE ONLY) 1340  Mobility Specialist Stop Time (ACUTE ONLY) 1400  Mobility Specialist Time Calculation (min) (ACUTE ONLY) 20 min   Pt received in bed, agreeable to mobility. Required MinA to stand and SBA to ambulate with RW. Tolerated well, asx throughout. Left in chair, call bell in reach. All needs met.  Denean Pavon Mobility Specialist Please contact via Special Educational Needs Teacher or  Rehab office at [phone]

## 2024-02-26 NOTE — Progress Notes (Signed)
 Daily Progress Note   Patient Name: Maria Pace       Date: 02/26/2024 DOB: 28-Nov-1940  Age: 83 y.o. MRN#: [id number] Attending Physician: Pearlean Manus, MD Primary Care Physician: Gammon, Chrystal, NP Admit Date: 02/16/2024  Reason for Consultation/Follow-up: Establishing goals of care  Patient Profile/HPI:  83 y.o. female with past medical history of schizophrenia, bipolar 1, GIB, afib (no anticoagulation d/t GIB), HFrEF, HTN, HLD, DM2, GERD admitted on 02/16/2024 from group home with large amount of diarrhea with concern for blood. She was admitted for stercoral colitis. Palliative medicine consulted for goals of care.   Subjective: Chart reviewed including labs, progress notes, imaging from this and previous encounters.  Patient is medically stable for discharge awaiting eval for possible return to her group home.  On eval she is awake and alert. Asking when she can go home. Feels fine.  I called and spoke to [facility], patient's sister. Vernell did not get a chance to discuss code status or goals of care with family. She plans to at a later time. For now patient remains full scope, full code.  Vernell is hopeful for patient to return to her group home.   Review of Systems  All other systems reviewed and are negative.    Physical Exam Vitals and nursing note reviewed.  Constitutional:      General: She is not in acute distress. Cardiovascular:     Rate and Rhythm: Normal rate.     Pulses: Normal pulses.  Neurological:     Mental Status: She is alert.             Vital Signs: BP (!) 99/56 (BP Location: Left Wrist)   Pulse 65   Temp 98.2 F (36.8 C)   Resp 17   Ht 5' (1.524 m)   Wt 100.9 kg   SpO2 97%   BMI 43.44 kg/m  SpO2: SpO2: 97 % O2 Device: O2 Device: Room  Air O2 Flow Rate: O2 Flow Rate (L/min): 2 L/min  Intake/output summary:  Intake/Output Summary (Last 24 hours) at 02/26/2024 1357 Last data filed at 02/26/2024 1200 Gross per 24 hour  Intake 840 ml  Output 850 ml  Net -10 ml   LBM: Last BM Date : 02/24/24 Baseline Weight: Weight: 97.3 kg Most recent weight: Weight: 100.9 kg  Palliative Assessment/Data:      Patient Active Problem List   Diagnosis Date Noted   Gastrointestinal hemorrhage 02/20/2024   Chronic constipation 02/17/2024   Acute on chronic anemia 02/17/2024   Stercoral colitis 02/16/2024   Symptomatic anemia 10/21/2023   Melena 10/21/2023   Chronic anticoagulation 10/21/2023   Acute blood loss anemia 10/20/2023   Chronic HFrEF (heart failure with reduced ejection fraction) (HCC) 10/20/2023   Current use of long term anticoagulation 09/01/2023   PAF (paroxysmal atrial fibrillation) (HCC) 09/01/2023   Sepsis due to COVID-19 (HCC) 07/12/2022   Chronic combined systolic and diastolic congestive heart failure (HCC)    ARF (acute renal failure) 10/04/2019   Elevated CK 10/03/2019   SVT (supraventricular tachycardia) 10/03/2019   GERD (gastroesophageal reflux disease)    Schizophrenia (HCC) 11/15/2017   Hypotension 02/20/2017   Severe dehydration 02/20/2017   AKI (acute kidney injury) 02/20/2017   Constipation 02/20/2017   Lactic acidosis 02/20/2017   Urinary retention 11/10/2016   Hypertension 11/10/2016   Type 2 diabetes mellitus (HCC) 11/10/2016   Hyperlipidemia 11/10/2016   Hyponatremia 11/10/2016   SBO (small bowel obstruction) (HCC) 11/03/2015    Palliative Care Assessment & Plan    Assessment/Recommendations/Plan  Stercoral colitis- now stable GOC - full scope, full code, hopeful for return to group home   Code Status:   Code Status: Full Code   Prognosis:  Unable to determine  Discharge Planning: To Be Determined  Care plan was discussed with patient's sister.   Thank you for  allowing the Palliative Medicine Team to assist in the care of this patient.  Total time:  50 minutes Prolonged billing:  Time includes:   Preparing to see the patient (e.g., review of tests) Obtaining and/or reviewing separately obtained history Performing a medically necessary appropriate examination and/or evaluation Counseling and educating the patient/family/caregiver Ordering medications, tests, or procedures Referring and communicating with other health care professionals (when not reported separately) Documenting clinical information in the electronic or other health record Independently interpreting results (not reported separately) and communicating results to the patient/family/caregiver Care coordination (not reported separately) Clinical documentation  Cassondra Stain, AGNP-C Palliative Medicine   Please contact Palliative Medicine Team phone at [phone] for questions and concerns.

## 2024-02-27 DIAGNOSIS — K5289 Other specified noninfective gastroenteritis and colitis: Secondary | ICD-10-CM | POA: Diagnosis not present

## 2024-02-27 LAB — GLUCOSE, CAPILLARY
Glucose-Capillary: 81 mg/dL (ref 70–99)
Glucose-Capillary: 157 mg/dL — ABNORMAL HIGH (ref 70–99)
Glucose-Capillary: 106 mg/dL — ABNORMAL HIGH (ref 70–99)
Glucose-Capillary: 179 mg/dL — ABNORMAL HIGH (ref 70–99)

## 2024-02-27 MED ORDER — SODIUM BICARBONATE 650 MG PO TABS: 650.0000 mg | ORAL_TABLET | Freq: Two times a day (BID) | ORAL | 2 refills | Status: AC

## 2024-02-27 MED ORDER — POLYETHYLENE GLYCOL 3350 17 GM/SCOOP PO POWD: 17.0000 g | Freq: Every day | ORAL | 11 refills | Status: DC | PRN

## 2024-02-27 MED ORDER — MIDODRINE HCL 10 MG PO TABS: 10.0000 mg | ORAL_TABLET | Freq: Three times a day (TID) | ORAL | 5 refills | Status: AC

## 2024-02-27 MED ORDER — CYANOCOBALAMIN 500 MCG PO TABS: 500.0000 ug | ORAL_TABLET | Freq: Every day | ORAL | 5 refills | Status: AC

## 2024-02-27 MED ORDER — POLYETHYLENE GLYCOL 3350 17 GM/SCOOP PO POWD: 17.0000 g | ORAL | 11 refills | Status: AC

## 2024-02-27 MED ORDER — AMIODARONE HCL 200 MG PO TABS: 200.0000 mg | ORAL_TABLET | Freq: Every day | ORAL | 5 refills | Status: AC

## 2024-02-27 MED ORDER — ENSURE PLUS HIGH PROTEIN PO LIQD
237.0000 mL | Freq: Two times a day (BID) | ORAL | Status: DC
  Administered 2024-02-27 – 2024-02-29 (×5): 237 mL via ORAL

## 2024-02-27 MED ORDER — GABAPENTIN 300 MG PO CAPS: 300.0000 mg | ORAL_CAPSULE | Freq: Every day | ORAL | 2 refills | Status: AC

## 2024-02-27 MED ORDER — TAMSULOSIN HCL 0.4 MG PO CAPS: 0.4000 mg | ORAL_CAPSULE | Freq: Every day | ORAL | 0 refills | Status: AC

## 2024-02-27 MED ORDER — DIVALPROEX SODIUM ER 250 MG PO TB24: 250.0000 mg | ORAL_TABLET | Freq: Every day | ORAL | 3 refills | Status: AC

## 2024-02-27 MED ORDER — LEVOTHYROXINE SODIUM 75 MCG PO TABS: 75.0000 ug | ORAL_TABLET | Freq: Every day | ORAL | 3 refills | Status: AC

## 2024-02-27 MED ORDER — FEROSUL 325 (65 FE) MG PO TABS: 325.0000 mg | ORAL_TABLET | Freq: Every day | ORAL | 3 refills | Status: AC

## 2024-02-27 MED ORDER — PANTOPRAZOLE SODIUM 40 MG PO TBEC: 40.0000 mg | DELAYED_RELEASE_TABLET | Freq: Two times a day (BID) | ORAL | 5 refills | Status: AC

## 2024-02-27 MED ORDER — ACETAMINOPHEN 325 MG PO TABS: 650.0000 mg | ORAL_TABLET | Freq: Four times a day (QID) | ORAL | Status: AC | PRN

## 2024-02-27 MED ORDER — SENNOSIDES-DOCUSATE SODIUM 8.6-50 MG PO TABS: 2.0000 | ORAL_TABLET | Freq: Every day | ORAL | 2 refills | Status: AC

## 2024-02-27 MED ORDER — ALBUTEROL SULFATE (2.5 MG/3ML) 0.083% IN NEBU: 2.5000 mg | INHALATION_SOLUTION | RESPIRATORY_TRACT | 12 refills | Status: AC | PRN

## 2024-02-27 MED ORDER — VITAMIN C 500 MG PO TABS: 500.0000 mg | ORAL_TABLET | Freq: Every day | ORAL | 5 refills | Status: AC

## 2024-02-27 MED ORDER — SACUBITRIL-VALSARTAN 24-26 MG PO TABS: 1.0000 | ORAL_TABLET | Freq: Two times a day (BID) | ORAL | 11 refills | Status: AC

## 2024-02-27 MED ORDER — METOPROLOL SUCCINATE ER 25 MG PO TB24: 12.5000 mg | ORAL_TABLET | Freq: Two times a day (BID) | ORAL | 3 refills | Status: AC

## 2024-02-27 MED ORDER — ASPIRIN 81 MG PO TBEC: 81.0000 mg | DELAYED_RELEASE_TABLET | Freq: Every day | ORAL | 2 refills | Status: AC
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
## 2024-02-27 NOTE — Progress Notes (Signed)
 Mobility Specialist Progress Note:    02/27/24 1102  Mobility  Activity Pivoted/transferred to/from [facility]  Level of Assistance Minimal assist, patient does 75% or more  Assistive Device Front wheel walker  Distance Ambulated (ft) 4 ft  Range of Motion/Exercises Active;All extremities  Activity Response Tolerated well  Mobility Referral Yes  Mobility visit 1 Mobility  Mobility Specialist Start Time (ACUTE ONLY) 1102  Mobility Specialist Stop Time (ACUTE ONLY) 1120  Mobility Specialist Time Calculation (min) (ACUTE ONLY) 18 min   Pt received in chair, agreeable to mobility. Required MinA to stand and SBA to ambulate with RW. Tolerated well, asx throughout. Left on BSC, NT notified. Call bell in reach, all needs met.  Mikhaela Zaugg Mobility Specialist Please contact via Special Educational Needs Teacher or  Rehab office at [phone]

## 2024-02-27 NOTE — Discharge Instructions (Signed)
 1)Avoid ibuprofen/Advil/Aleve/Motrin/Goody Powders/Naproxen/BC powders/Meloxicam/Diclofenac/Indomethacin and other Nonsteroidal anti-inflammatory medications as these will make you more likely to bleed and can cause stomach ulcers, can also cause Kidney problems.   2)Watch for bleeding while on Blood Thinners--watch for blood in your stool which can make your stool black, maroon, mahogany or red---, blood in your urine which can make your urine pink or red, nosebleeds , also watch for possible bruising -You are taking Aspirin -- which is a blood thinner--- be careful to avoid injury or falls  3)Repeat CBC and BMP Blood Tests every Friday x 3 weeks ---starting 03/01/24  PLEASE MONITOR AND CHECK BLOOD SUGARS 4 TIMES PER DAY AND AS NEEDED IF NOT FEELING WELL   IMPORTANT INFORMATION: PAY CLOSE ATTENTION   PHYSICIAN DISCHARGE INSTRUCTIONS  Follow with Primary care provider  Wilmon Penton, NP  and other consultants as instructed by your Hospitalist Physician  SEEK MEDICAL CARE OR RETURN TO EMERGENCY ROOM IF SYMPTOMS COME BACK, WORSEN OR NEW PROBLEM DEVELOPS   Please note: You were cared for by a hospitalist during your hospital stay. Every effort will be made to forward records to your primary care provider.  You can request that your primary care provider send for your hospital records if they have not received them.  Once you are discharged, your primary care physician will handle any further medical issues. Please note that NO REFILLS for any discharge medications will be authorized once you are discharged, as it is imperative that you return to your primary care physician (or establish a relationship with a primary care physician if you do not have one) for your post hospital discharge needs so that they can reassess your need for medications and monitor your lab values.  Please get a complete blood count and chemistry panel checked by your Primary MD at your next visit, and again as instructed  by your Primary MD.  Get Medicines reviewed and adjusted: Please take all your medications with you for your next visit with your Primary MD  Laboratory/radiological data: Please request your Primary MD to go over all hospital tests and procedure/radiological results at the follow up, please ask your primary care provider to get all Hospital records sent to his/her office.  In some cases, they will be blood work, cultures and biopsy results pending at the time of your discharge. Please request that your primary care provider follow up on these results.  If you are diabetic, please bring your blood sugar readings with you to your follow up appointment with primary care.    Please call and make your follow up appointments as soon as possible.    Also Note the following: If you experience worsening of your admission symptoms, develop shortness of breath, life threatening emergency, suicidal or homicidal thoughts you must seek medical attention immediately by calling 911 or calling your MD immediately  if symptoms less severe.  You must read complete instructions/literature along with all the possible adverse reactions/side effects for all the Medicines you take and that have been prescribed to you. Take any new Medicines after you have completely understood and accpet all the possible adverse reactions/side effects.   Do not drive when taking Pain medications or sleeping medications (Benzodiazepines)  Do not take more than prescribed Pain, Sleep and Anxiety Medications. It is not advisable to combine anxiety,sleep and pain medications without talking with your primary care practitioner  Special Instructions: If you have smoked or chewed Tobacco  in the last 2 yrs please stop smoking, stop any  regular Alcohol   and or any Recreational drug use.  Wear Seat belts while driving.  Do not drive if taking any narcotic, mind altering or controlled substances or recreational drugs or alcohol .

## 2024-02-27 NOTE — NC FL2 (Signed)
 [place]  MEDICAID FL2 LEVEL OF CARE FORM     IDENTIFICATION  Patient Name: Maria Pace Birthdate: 1940-08-25 Sex: female Admission Date (Current Location): 02/16/2024  [facility] and [place] Number:  [employer] and Address:  [facility],  [address] [place] [address]      Provider Number: [id number]  Attending Physician Name and Address:  Pearlean Manus, MD  Relative Name and Phone Number:  Holt,Vernell (Sister/legal guardian)  [phone]    Current Level of Care: Hospital Recommended Level of Care: [facility] Prior Approval Number:    Date Approved/Denied:   PASRR Number: [id number]  Discharge Plan: Other (Comment) (Family care home)    Current Diagnoses: Patient Active Problem List   Diagnosis Date Noted   Gastrointestinal hemorrhage 02/20/2024   Chronic constipation 02/17/2024   Acute on chronic anemia 02/17/2024   Stercoral colitis 02/16/2024   Symptomatic anemia 10/21/2023   Melena 10/21/2023   Chronic anticoagulation 10/21/2023   Acute blood loss anemia 10/20/2023   Chronic HFrEF (heart failure with reduced ejection fraction) (HCC) 10/20/2023   Current use of long term anticoagulation 09/01/2023   PAF (paroxysmal atrial fibrillation) (HCC) 09/01/2023   Sepsis due to COVID-19 (HCC) 07/12/2022   Chronic combined systolic and diastolic congestive heart failure (HCC)    ARF (acute renal failure) 10/04/2019   Elevated CK 10/03/2019   SVT (supraventricular tachycardia) 10/03/2019   GERD (gastroesophageal reflux disease)    Schizophrenia (HCC) 11/15/2017   Hypotension 02/20/2017   Severe dehydration 02/20/2017   AKI (acute kidney injury) 02/20/2017   Constipation 02/20/2017   Lactic acidosis 02/20/2017   Urinary retention 11/10/2016   Hypertension 11/10/2016   Type 2 diabetes mellitus (HCC) 11/10/2016   Hyperlipidemia 11/10/2016   Hyponatremia 11/10/2016   SBO (small bowel obstruction) (HCC) 11/03/2015     Orientation RESPIRATION BLADDER Height & Weight     Self, Time, Place  Normal Incontinent Weight: 222 lb 7.1 oz (100.9 kg) Height:  5' (152.4 cm)  BEHAVIORAL SYMPTOMS/MOOD NEUROLOGICAL BOWEL NUTRITION STATUS      Incontinent Diet (Heart healthy/carb modified)  AMBULATORY STATUS COMMUNICATION OF NEEDS Skin   Limited Assist Verbally Normal                       Personal Care Assistance Level of Assistance  Bathing, Feeding, Dressing Bathing Assistance: Limited assistance Feeding assistance: Independent Dressing Assistance: Limited assistance     Functional Limitations Info  Sight, Hearing, Speech Sight Info: Impaired Hearing Info: Impaired Speech Info: Adequate    SPECIAL CARE FACTORS FREQUENCY  PT (By licensed PT), OT (By licensed OT)     PT Frequency: 5x/wk OT Frequency: 5x/wk            Contractures Contractures Info: Not present    Additional Factors Info  Code Status, Allergies Code Status Info: FULL Allergies Info: Haloperidol Lactate Psychotropic Info: Depakote  and Zyprexa          Current Medications (02/27/2024):  This is the current hospital active medication list Current Facility-Administered Medications  Medication Dose Route Frequency Provider Last Rate Last Admin   acetaminophen  (TYLENOL ) tablet 650 mg  650 mg Oral Q6H PRN Elgergawy, Dawood S, MD       Or   acetaminophen  (TYLENOL ) suppository 650 mg  650 mg Rectal Q6H PRN Elgergawy, Dawood S, MD       albuterol  (PROVENTIL ) (2.5 MG/3ML) 0.083% nebulizer solution 2.5 mg  2.5 mg Nebulization Q2H PRN Elgergawy, Dawood S, MD  amiodarone  (PACERONE ) tablet 200 mg  200 mg Oral Daily Emokpae, Courage, MD   200 mg at 02/27/24 9140   Chlorhexidine  Gluconate Cloth 2 % PADS 6 each  6 each Topical Daily Shah, Pratik D, DO   6 each at 02/26/24 0840   divalproex  (DEPAKOTE  ER) 24 hr tablet 250 mg  250 mg Oral Daily Elgergawy, Dawood S, MD   250 mg at 02/27/24 0906   feeding supplement (ENSURE PLUS HIGH  PROTEIN) liquid 237 mL  237 mL Oral BID BM Emokpae, Courage, MD   237 mL at 02/27/24 0900   HYDROcodone -acetaminophen  (NORCO/VICODIN) 5-325 MG per tablet 1-2 tablet  1-2 tablet Oral Q4H PRN Elgergawy, Dawood S, MD   1 tablet at 02/21/24 1154   insulin  aspart (novoLOG ) injection 0-9 Units  0-9 Units Subcutaneous TID AC & HS Jesus America, NP   2 Units at 02/27/24 1218   latanoprost  (XALATAN ) 0.005 % ophthalmic solution 1 drop  1 drop Both Eyes QHS Elgergawy, Brayton RAMAN, MD   1 drop at 02/26/24 2105   levothyroxine  (SYNTHROID ) tablet 75 mcg  75 mcg Oral Q0600 Pearlean Manus, MD   75 mcg at 02/27/24 0524   liver oil-zinc  oxide (DESITIN) 40 % ointment   Topical Q0600 Shah, Pratik D, DO   Given at 02/27/24 9475   metoprolol  succinate (TOPROL -XL) 24 hr tablet 12.5 mg  12.5 mg Oral BID Emokpae, Courage, MD   12.5 mg at 02/27/24 9140   midodrine  (PROAMATINE ) tablet 10 mg  10 mg Oral TID WC Shah, Pratik D, DO   10 mg at 02/27/24 1219   multivitamins with iron  tablet 1 tablet  1 tablet Oral Daily Emokpae, Courage, MD   1 tablet at 02/27/24 0859   OLANZapine  (ZYPREXA ) tablet 20 mg  20 mg Oral QHS Elgergawy, Dawood S, MD   20 mg at 02/26/24 2104   OLANZapine  (ZYPREXA ) tablet 5 mg  5 mg Oral QHS Elgergawy, Dawood S, MD   5 mg at 02/26/24 2103   ondansetron  (ZOFRAN ) tablet 4 mg  4 mg Oral Q6H PRN Elgergawy, Dawood S, MD       Or   ondansetron  (ZOFRAN ) injection 4 mg  4 mg Intravenous Q6H PRN Elgergawy, Dawood S, MD       Oral care mouth rinse  15 mL Mouth Rinse PRN Maree, Pratik D, DO       pantoprazole  (PROTONIX ) EC tablet 40 mg  40 mg Oral BID Elgergawy, Dawood S, MD   40 mg at 02/27/24 0859   polyethylene glycol (MIRALAX  / GLYCOLAX ) packet 17 g  17 g Oral Daily PRN Shirlean Therisa ORN, NP       polyethylene glycol (MIRALAX  / GLYCOLAX ) packet 17 g  17 g Oral Daily Shirlean Therisa ORN, NP   17 g at 02/26/24 9163   senna-docusate (Senokot-S) tablet 1 tablet  1 tablet Oral BID Elgergawy, Dawood S, MD   1 tablet at 02/26/24  2103   sodium bicarbonate  tablet 650 mg  650 mg Oral BID Shah, Pratik D, DO   650 mg at 02/27/24 9140   tamsulosin  (FLOMAX ) capsule 0.4 mg  0.4 mg Oral QPC supper Pearlean Manus, MD   0.4 mg at 02/26/24 1721     Discharge Medications: Allergies as of 02/27/2024       Reactions   Haloperidol Lactate Other (See Comments)   Couldn't urinate anymore        Medication List     STOP taking these medications  docusate sodium  100 MG capsule Commonly known as: COLACE   Eliquis  5 MG Tabs tablet Generic drug: apixaban    metoprolol  tartrate 25 MG tablet Commonly known as: LOPRESSOR    spironolactone  25 MG tablet Commonly known as: ALDACTONE        TAKE these medications    acetaminophen  325 MG tablet Commonly known as: TYLENOL  Take 2 tablets (650 mg total) by mouth every 6 (six) hours as needed for mild pain (pain score 1-3) or fever (or Fever >/= 101).   albuterol  (2.5 MG/3ML) 0.083% nebulizer solution Commonly known as: PROVENTIL  Take 3 mLs (2.5 mg total) by nebulization every 2 (two) hours as needed for wheezing.   amiodarone  200 MG tablet Commonly known as: PACERONE  Take 1 tablet (200 mg total) by mouth daily. What changed: See the new instructions.   ammonium lactate 12 % lotion Commonly known as: LAC-HYDRIN Apply 1 Application topically as needed for dry skin.   ascorbic acid  500 MG tablet Commonly known as: VITAMIN C  Take 1 tablet (500 mg total) by mouth daily.   aspirin  EC 81 MG tablet Take 1 tablet (81 mg total) by mouth daily with breakfast. Swallow whole. What changed: when to take this   atorvastatin  20 MG tablet Commonly known as: LIPITOR Take 20 mg by mouth daily.   cholecalciferol  10 MCG (400 UNIT) Tabs tablet Commonly known as: VITAMIN D3 Take 1,000 Units by mouth daily.   [employer] MEDICATED CHEST RUB EX Apply topically.   cyanocobalamin  500 MCG tablet Commonly known as: VITAMIN B12 Take 1 tablet (500 mcg total) by mouth daily.    divalproex  250 MG 24 hr tablet Commonly known as: DEPAKOTE  ER Take 1 tablet (250 mg total) by mouth daily.   FeroSul 325 (65 Fe) MG tablet Generic drug: ferrous sulfate  Take 1 tablet (325 mg total) by mouth daily with breakfast. What changed: when to take this   gabapentin  300 MG capsule Commonly known as: Neurontin  Take 1 capsule (300 mg total) by mouth at bedtime. What changed:  medication strength how much to take   latanoprost  0.005 % ophthalmic solution Commonly known as: XALATAN  Place 1 drop into both eyes at bedtime.   levothyroxine  75 MCG tablet Commonly known as: SYNTHROID  Take 1 tablet (75 mcg total) by mouth daily before breakfast. What changed:  medication strength how much to take when to take this   loratadine  10 MG tablet Commonly known as: CLARITIN  Take 10 mg by mouth daily.   metFORMIN 500 MG tablet Commonly known as: GLUCOPHAGE Take 500 mg by mouth 2 (two) times daily with a meal.   metoprolol  succinate 25 MG 24 hr tablet Commonly known as: TOPROL -XL Take 0.5 tablets (12.5 mg total) by mouth 2 (two) times daily.   midodrine  10 MG tablet Commonly known as: PROAMATINE  Take 1 tablet (10 mg total) by mouth 3 (three) times daily with meals.   multivitamin tablet Take 1 tablet by mouth daily.   OLANZapine  5 MG tablet Commonly known as: ZYPREXA  Take 1 tablet (5 mg total) by mouth at bedtime. Total of 25 mg at night. Take along with 20 mg tab What changed: Another medication with the same name was changed. Make sure you understand how and when to take each.   OLANZapine  20 MG tablet Commonly known as: ZYPREXA  Take 1 tablet (20 mg total) by mouth at bedtime. Total of 25 mg at night. Take along with 20 mg tab What changed: additional instructions   pantoprazole  40 MG tablet Commonly known as: PROTONIX   Take 1 tablet (40 mg total) by mouth 2 (two) times daily. What changed: when to take this   polyethylene glycol powder 17 GM/SCOOP  powder Commonly known as: GLYCOLAX /MIRALAX  Take 17 g by mouth every Tuesday, Thursday, Saturday, and Sunday at 6 PM. What changed:  when to take this reasons to take this   sacubitril -valsartan  24-26 MG Commonly known as: ENTRESTO  Take 1 tablet by mouth 2 (two) times daily.   senna-docusate 8.6-50 MG tablet Commonly known as: Senokot-S Take 2 tablets by mouth at bedtime.   sodium bicarbonate  650 MG tablet Take 1 tablet (650 mg total) by mouth 2 (two) times daily.   tamsulosin  0.4 MG Caps capsule Commonly known as: FLOMAX  Take 1 capsule (0.4 mg total) by mouth daily after supper.   zinc  sulfate (50mg  elemental zinc ) 220 (50 Zn) MG capsule Take 220 mg by mouth daily.               Discharge Care Instructions  (From admission, onward)           Start     Ordered   02/27/24 0000  Discharge wound care:       Comments: As advised   02/27/24 1419             Relevant Imaging Results:  Relevant Lab Results:   Additional Information SSN: [id number] [place]

## 2024-02-27 NOTE — TOC Progression Note (Signed)
 Transition of Care [facility]) - Progression Note    Patient Details  Name: Maria Pace MRN: [id number] Date of Birth: 08-Jul-1940  Transition of Care [facility]) CM/SW Contact  Lucie Lunger, [place] Phone Number: 02/27/2024, 10:44 AM  Clinical Narrative:    CSW attempted to reach [facility] yesterday at 4 to get update on how assessment went, no answer. CSW has called and sent secure text to Randie this morning requesting update at this time as pt is medically stable for D/C back to facility. TOC to follow.   Expected Discharge Plan: Group Home Barriers to Discharge: Continued Medical Work up               Expected Discharge Plan and Services In-house Referral: Clinical Social Work Discharge Planning Services: CM Consult Post Acute Care Choice: Home Health Living arrangements for the past 2 months: Group Home                                       Social Drivers of Health (SDOH) Interventions SDOH Screenings   Food Insecurity: No Food Insecurity (02/16/2024)  Housing: Low Risk  (02/16/2024)  Transportation Needs: No Transportation Needs (02/16/2024)  Recent Concern: Transportation Needs - Unmet Transportation Needs (02/16/2024)  Utilities: Not At Risk (02/16/2024)  Depression (PHQ2-9): High Risk (09/06/2021)  Social Connections: Unknown (02/16/2024)  Tobacco Use: Low Risk  (02/21/2024)    Readmission Risk Interventions    02/18/2024   10:48 AM 02/17/2024   12:13 PM 07/14/2022   11:16 AM  Readmission Risk Prevention Plan  Transportation Screening Complete Complete Complete  PCP or Specialist Appt within 5-7 Days   Not Complete  PCP or Specialist Appt within 3-5 Days Complete    Home Care Screening   Complete  Medication Review (RN CM)   Complete  HRI or Home Care Consult Complete Complete   Social Work Consult for Recovery Care Planning/Counseling Complete Complete   Palliative Care Screening Not Applicable Not Applicable   Medication Review Ocean[employer])  Complete Complete

## 2024-02-27 NOTE — Care Management Important Message (Signed)
 Important Message  Patient Details  Name: Maria Pace MRN: [id number] Date of Birth: March 11, 1941   Important Message Given:  Yes - Medicare IM     [name] L Kang Ishida 02/27/2024, 2:20 PM

## 2024-02-27 NOTE — Progress Notes (Signed)
 Physical Therapy Treatment Patient Details Name: Maria Pace MRN: [id number] DOB: 03/30/1940 Today's Date: 02/27/2024   History of Present Illness Maria Pace  is a 83 y.o. female,    with medical history significant of bipolar 1 disorder, schizophrenia, essential hypertension, GERD, hyperlipidemia, history of irregular heartbeat, type 2 diabetes mellitus, PAF on apixaban , HFrEF (EF 30-35%), HTN, patient with hospitalization last July, due to acute anemia from GI bleed, for which she refused endoscopy/colonoscopy .  - Was sent from her group home as she was found blood with large amount of diarrhea, dark stools, poor historian due to schizophrenia, she is answering question appropriately but cannot give exact details, she had anemia in July, refused GI evaluation, unfortunately there is no medication list by the patient, by by reviewing dispense record it does appear she is not anymore on her metoprolol  or Eliquis .  - In ED she had fever 101.4, CTA GI bleed protocol with no evidence of active extravasation, but was significant for sterile coral colitis, patient had manual disimpaction by ED physician, no stool in color, was followed by gray loose stool, no melena, but she was Hemoccult positive, her globin was stable, creatinine at baseline, [facility] hospitalist consulted to admit    PT Comments  Patient agreeable to PT treatment session. Patient was received sitting in chair at start of session. Agrees to seated LE exercises. Pt demonstrates good seated balance in recliner while performing LE Exercises with verbal and visual cues, indicating improved core strength. Pt given print off of HEP. 2 STS completed this date with ~moderate assistance using RW. Pt required assistance to scoot bottom back in chair to prevent sliding forward out of chair/reduce fall risk, ~mod/max assist. Pt remains in chair at end of session, chair alarm set, call button in reach and all needs met. Patient will benefit from continued  skilled physical therapy acutely and in recommended venue in order to address current deficits to return to PLOF and group home.     If plan is discharge home, recommend the following: A lot of help with bathing/dressing/bathroom;A lot of help with walking and/or transfers;Help with stairs or ramp for entrance;Assist for transportation;Assistance with cooking/housework   Can travel by private vehicle     No  Equipment Recommendations  None recommended by PT    Recommendations for Other Services       Precautions / Restrictions Precautions Precautions: Fall Recall of Precautions/Restrictions: Impaired Restrictions Weight Bearing Restrictions Per Provider Order: No     Mobility  Bed Mobility               General bed mobility comments: Not assessed this date, pt received sitting in chair    Transfers Overall transfer level: Needs assistance Equipment used: Rolling walker (2 wheels) Transfers: Sit to/from Stand Sit to Stand: Min assist, Mod assist           General transfer comment: 2 STS from chair during session, pt initially unable to complete stand independently, requiring min/mod assist due to LE weakness, verbal cues to correct hand placement    Ambulation/Gait               General Gait Details: pt declined ambulation this date due to fatigue   Stairs             Wheelchair Mobility     Tilt Bed    Modified Rankin (Stroke Patients Only)       Balance Overall balance assessment: Needs assistance Sitting-balance support: Feet supported,  Bilateral upper extremity supported Sitting balance-Leahy Scale: Good Sitting balance - Comments: Seated in chair   Standing balance support: Reliant on assistive device for balance, During functional activity, Bilateral upper extremity supported Standing balance-Leahy Scale: Poor Standing balance comment: using RW                            Communication Communication Communication:  No apparent difficulties Factors Affecting Communication: Reduced clarity of speech  Cognition Arousal: Alert Behavior During Therapy: WFL for tasks assessed/performed                             Following commands: Intact Following commands impaired: Only follows one step commands consistently    Cueing Cueing Techniques: Verbal cues, Gestural cues  Exercises General Exercises - Lower Extremity Long Arc Quad: AROM, Strengthening, Both, 10 reps, Seated Heel Slides: AROM, Strengthening, Both, 10 reps, Seated Hip Flexion/Marching: AROM, Strengthening, Both, 15 reps, Seated Toe Raises: AROM, Strengthening, Both, 15 reps, Seated Heel Raises: AROM, Strengthening, Both, 15 reps, Seated    General Comments        Pertinent Vitals/Pain Pain Assessment Pain Assessment: No/denies pain    Home Living                          Prior Function            PT Goals (current goals can now be found in the care plan section) Acute Rehab PT Goals Patient Stated Goal: Return to group home PT Goal Formulation: With patient Time For Goal Achievement: 03/05/24 Potential to Achieve Goals: Good Progress towards PT goals: Progressing toward goals    Frequency    Min 3X/week      PT Plan      Co-evaluation              AM-PAC PT 6 Clicks Mobility   Outcome Measure  Help needed turning from your back to your side while in a flat bed without using bedrails?: A Little Help needed moving from lying on your back to sitting on the side of a flat bed without using bedrails?: A Lot Help needed moving to and from a bed to a chair (including a wheelchair)?: A Lot Help needed standing up from a chair using your arms (e.g., wheelchair or bedside chair)?: A Little Help needed to walk in hospital room?: A Lot Help needed climbing 3-5 steps with a railing? : A Lot 6 Click Score: 14    End of Session Equipment Utilized During Treatment: Gait belt Activity Tolerance:  Patient tolerated treatment well;Patient limited by fatigue Patient left: in chair;with call bell/phone within reach;with chair alarm set   PT Visit Diagnosis: Unsteadiness on feet (R26.81);Other abnormalities of gait and mobility (R26.89);Muscle weakness (generalized) (M62.81)     Time: 8996-8981 PT Time Calculation (min) (ACUTE ONLY): 15 min  Charges:    $Therapeutic Exercise: 8-22 mins PT General Charges $$ ACUTE PT VISIT: 1 Visit                     2:26 PM, 02/27/24 Revel Stellmach Powell-Butler, PT, DPT [place] with [facility]

## 2024-02-27 NOTE — NC FL2 (Signed)
 [place]  MEDICAID FL2 LEVEL OF CARE FORM     IDENTIFICATION  Patient Name: Maria Pace Birthdate: 1940/09/10 Sex: female Admission Date (Current Location): 02/16/2024  [facility] and [place] Number:  [employer] and Address:  [facility],  [address], [place] [address]      Provider Number: [id number]  Attending Physician Name and Address:  Pearlean Manus, MD  Relative Name and Phone Number:  Holt,Vernell (Sister/legal guardian)  [phone]    Current Level of Care: Hospital Recommended Level of Care: Skilled Nursing Facility Prior Approval Number:    Date Approved/Denied:   PASRR Number: [id number]  Discharge Plan: SNF    Current Diagnoses: Patient Active Problem List   Diagnosis Date Noted   Gastrointestinal hemorrhage 02/20/2024   Chronic constipation 02/17/2024   Acute on chronic anemia 02/17/2024   Stercoral colitis 02/16/2024   Symptomatic anemia 10/21/2023   Melena 10/21/2023   Chronic anticoagulation 10/21/2023   Acute blood loss anemia 10/20/2023   Chronic HFrEF (heart failure with reduced ejection fraction) (HCC) 10/20/2023   Current use of long term anticoagulation 09/01/2023   PAF (paroxysmal atrial fibrillation) (HCC) 09/01/2023   Sepsis due to COVID-19 (HCC) 07/12/2022   Chronic combined systolic and diastolic congestive heart failure (HCC)    ARF (acute renal failure) 10/04/2019   Elevated CK 10/03/2019   SVT (supraventricular tachycardia) 10/03/2019   GERD (gastroesophageal reflux disease)    Schizophrenia (HCC) 11/15/2017   Hypotension 02/20/2017   Severe dehydration 02/20/2017   AKI (acute kidney injury) 02/20/2017   Constipation 02/20/2017   Lactic acidosis 02/20/2017   Urinary retention 11/10/2016   Hypertension 11/10/2016   Type 2 diabetes mellitus (HCC) 11/10/2016   Hyperlipidemia 11/10/2016   Hyponatremia 11/10/2016   SBO (small bowel obstruction) (HCC) 11/03/2015    Orientation RESPIRATION  BLADDER Height & Weight     Self, Time, Place  Normal Incontinent Weight: 222 lb 7.1 oz (100.9 kg) Height:  5' (152.4 cm)  BEHAVIORAL SYMPTOMS/MOOD NEUROLOGICAL BOWEL NUTRITION STATUS      Incontinent Diet (heart healthy)  AMBULATORY STATUS COMMUNICATION OF NEEDS Skin   Extensive Assist Verbally Normal                       Personal Care Assistance Level of Assistance  Bathing, Feeding, Dressing Bathing Assistance: Limited assistance Feeding assistance: Independent Dressing Assistance: Limited assistance     Functional Limitations Info  Sight, Hearing, Speech Sight Info: Impaired Hearing Info: Impaired Speech Info: Adequate    SPECIAL CARE FACTORS FREQUENCY  PT (By licensed PT), OT (By licensed OT)     PT Frequency: 5 times weekly OT Frequency: 5 times weekly            Contractures Contractures Info: Not present    Additional Factors Info  Code Status, Allergies Code Status Info: FULL Allergies Info: Haloperidol Lactate Psychotropic Info: Depakote  and Zyprexa          Current Medications (02/27/2024):  This is the current hospital active medication list Current Facility-Administered Medications  Medication Dose Route Frequency Provider Last Rate Last Admin   acetaminophen  (TYLENOL ) tablet 650 mg  650 mg Oral Q6H PRN Elgergawy, Dawood S, MD       Or   acetaminophen  (TYLENOL ) suppository 650 mg  650 mg Rectal Q6H PRN Elgergawy, Dawood S, MD       albuterol  (PROVENTIL ) (2.5 MG/3ML) 0.083% nebulizer solution 2.5 mg  2.5 mg Nebulization Q2H PRN Elgergawy, Dawood S, MD  amiodarone  (PACERONE ) tablet 200 mg  200 mg Oral Daily Emokpae, Courage, MD   200 mg at 02/27/24 9140   Chlorhexidine  Gluconate Cloth 2 % PADS 6 each  6 each Topical Daily Shah, Pratik D, DO   6 each at 02/26/24 0840   divalproex  (DEPAKOTE  ER) 24 hr tablet 250 mg  250 mg Oral Daily Elgergawy, Dawood S, MD   250 mg at 02/27/24 0906   feeding supplement (ENSURE PLUS HIGH PROTEIN) liquid 237 mL   237 mL Oral BID BM Emokpae, Courage, MD   237 mL at 02/27/24 0900   HYDROcodone -acetaminophen  (NORCO/VICODIN) 5-325 MG per tablet 1-2 tablet  1-2 tablet Oral Q4H PRN Elgergawy, Dawood S, MD   1 tablet at 02/21/24 1154   insulin  aspart (novoLOG ) injection 0-9 Units  0-9 Units Subcutaneous TID AC & HS Jesus America, NP   2 Units at 02/27/24 1218   latanoprost  (XALATAN ) 0.005 % ophthalmic solution 1 drop  1 drop Both Eyes QHS Elgergawy, Brayton RAMAN, MD   1 drop at 02/26/24 2105   levothyroxine  (SYNTHROID ) tablet 75 mcg  75 mcg Oral Q0600 Pearlean Manus, MD   75 mcg at 02/27/24 0524   liver oil-zinc  oxide (DESITIN) 40 % ointment   Topical Q0600 Shah, Pratik D, DO   Given at 02/27/24 9475   metoprolol  succinate (TOPROL -XL) 24 hr tablet 12.5 mg  12.5 mg Oral BID Emokpae, Courage, MD   12.5 mg at 02/27/24 9140   midodrine  (PROAMATINE ) tablet 10 mg  10 mg Oral TID WC Shah, Pratik D, DO   10 mg at 02/27/24 1219   multivitamins with iron  tablet 1 tablet  1 tablet Oral Daily Emokpae, Courage, MD   1 tablet at 02/27/24 0859   OLANZapine  (ZYPREXA ) tablet 20 mg  20 mg Oral QHS Elgergawy, Dawood S, MD   20 mg at 02/26/24 2104   OLANZapine  (ZYPREXA ) tablet 5 mg  5 mg Oral QHS Elgergawy, Dawood S, MD   5 mg at 02/26/24 2103   ondansetron  (ZOFRAN ) tablet 4 mg  4 mg Oral Q6H PRN Elgergawy, Dawood S, MD       Or   ondansetron  (ZOFRAN ) injection 4 mg  4 mg Intravenous Q6H PRN Elgergawy, Dawood S, MD       Oral care mouth rinse  15 mL Mouth Rinse PRN Maree, Pratik D, DO       pantoprazole  (PROTONIX ) EC tablet 40 mg  40 mg Oral BID Elgergawy, Dawood S, MD   40 mg at 02/27/24 0859   polyethylene glycol (MIRALAX  / GLYCOLAX ) packet 17 g  17 g Oral Daily PRN Shirlean Therisa ORN, NP       polyethylene glycol (MIRALAX  / GLYCOLAX ) packet 17 g  17 g Oral Daily Shirlean Therisa ORN, NP   17 g at 02/26/24 9163   senna-docusate (Senokot-S) tablet 1 tablet  1 tablet Oral BID Elgergawy, Dawood S, MD   1 tablet at 02/26/24 2103   sodium  bicarbonate tablet 650 mg  650 mg Oral BID Shah, Pratik D, DO   650 mg at 02/27/24 9140   tamsulosin  (FLOMAX ) capsule 0.4 mg  0.4 mg Oral QPC supper Pearlean Manus, MD   0.4 mg at 02/26/24 1721     Discharge Medications: Please see discharge summary for a list of discharge medications.  Relevant Imaging Results:  Relevant Lab Results:   Additional Information SSN: [id number] [place]

## 2024-02-27 NOTE — Plan of Care (Signed)

## 2024-02-27 NOTE — Progress Notes (Signed)
 Mobility Specialist Progress Note:    02/27/24 1500  Mobility  Activity Pivoted/transferred to/from [facility]  Level of Assistance Minimal assist, patient does 75% or more  Assistive Device Front wheel walker  Distance Ambulated (ft) 3 ft  Range of Motion/Exercises Active;All extremities  Activity Response Tolerated well  Mobility Referral Yes  Mobility visit 1 Mobility  Mobility Specialist Start Time (ACUTE ONLY) 1500  Mobility Specialist Stop Time (ACUTE ONLY) 1520  Mobility Specialist Time Calculation (min) (ACUTE ONLY) 20 min   Pt received on BSC, requesting assistance with peri care. Required MinA to stand and transfer with RW. Tolerated well,asx throughout. Left in chair, all needs met.  Alys Dulak Mobility Specialist Please contact via Special Educational Needs Teacher or  Rehab office at [phone]

## 2024-02-27 NOTE — Discharge Summary (Signed)
 Maria Pace, is a 83 y.o. female  DOB 10-08-1940  MRN [id number].  Admission date:  02/16/2024  Admitting Physician  Brayton GORMAN Lye, MD  Discharge Date:  02/27/2024   Primary MD  Gammon, Chrystal, NP  Recommendations for primary care physician for things to follow:  1)Avoid ibuprofen/Advil/Aleve/Motrin/Goody Powders/Naproxen/BC powders/Meloxicam/Diclofenac/Indomethacin and other Nonsteroidal anti-inflammatory medications as these will make you more likely to bleed and can cause stomach ulcers, can also cause Kidney problems.   2)Watch for bleeding while on Blood Thinners--watch for blood in your stool which can make your stool black, maroon, mahogany or red---, blood in your urine which can make your urine pink or red, nosebleeds , also watch for possible bruising -You are taking Aspirin -- which is a blood thinner--- be careful to avoid injury or falls  3)Repeat CBC and BMP Blood Tests every Friday x 3 weeks ---starting 03/01/24  Admission Diagnosis  Gastrointestinal hemorrhage, unspecified gastrointestinal hemorrhage type [K92.2] Stercoral colitis [K52.89]  Discharge Diagnosis  Gastrointestinal hemorrhage, unspecified gastrointestinal hemorrhage type [K92.2] Stercoral colitis [K52.89]    Principal Problem:   Stercoral colitis Active Problems:   Hypertension   Type 2 diabetes mellitus (HCC)   Severe dehydration   Schizophrenia (HCC)   GERD (gastroesophageal reflux disease)   PAF (paroxysmal atrial fibrillation) (HCC)   Chronic HFrEF (heart failure with reduced ejection fraction) (HCC)   Chronic constipation   Acute on chronic anemia   Gastrointestinal hemorrhage      Past Medical History:  Diagnosis Date   Bipolar 1 disorder (HCC)    Essential hypertension    GERD (gastroesophageal reflux disease)    Hyperlipidemia    Irregular heart beat    Schizophrenia (HCC)    Type 2 diabetes mellitus  (HCC)     Past Surgical History:  Procedure Laterality Date   YAG LASER APPLICATION Left 01/06/2015   Procedure: YAG LASER APPLICATION;  Surgeon: Oneil Platts, MD;  Location: [facility];  Service: Ophthalmology;  Laterality: Left;     HPI  from the history and physical done on the day of admission:   Maria Pace  is a 83 y.o. female,    with medical history significant of bipolar 1 disorder, schizophrenia, essential hypertension, GERD, hyperlipidemia, history of irregular heartbeat, type 2 diabetes mellitus, PAF on apixaban , HFrEF (EF 30-35%), HTN, patient with hospitalization last July, due to acute anemia from GI bleed, for which she refused endoscopy/colonoscopy . - Was sent from her group home as she was found blood with large amount of diarrhea, dark stools, poor historian due to schizophrenia, she is answering question appropriately but cannot give exact details, she had anemia in July, refused GI evaluation, unfortunately there is no medication list by the patient, by by reviewing dispense record it does appear she is not anymore on her metoprolol  or Eliquis . - In ED she had fever 101.4, CTA GI bleed protocol with no evidence of active extravasation, but was significant for sterile coral colitis, patient had manual disimpaction by ED physician, no stool in color, was followed  by gray loose stool, no melena, but she was Hemoccult positive, her globin was stable, creatinine at baseline, [facility] hospitalist consulted to admit    Hospital Course:   1)Sepsis, POA due to Sterocoral colitis Hemoccult positive stool -Occult positive stool, but hemoglobin is stable, had manual disimpaction in ED on admission, impacted stool was brown in color, no melena or bright red blood per rectum, followed by diarrhea dark gray in color (is on iron  tablets), no melena, Hemoccult positive stool most likely due to colitis and rectal trauma from manual disimpaction. -Blood cultures NGTD - treated with IV  Zosyn --overall much improved WBC is down to 5.3 from 11.0, transitioned to oral Augmentin  on 02/22/2024, last dose 02/25/2024 - Continue on laxative regimen---avoid constipation -PTA aspirin  remains on hold - Appreciate GI recommendations to conservatively manage and follow CBC and continue PPI twice daily as well as laxatives -CTA GI bleeding protocol is negative -Anesthesia and GI team reluctant to do colonoscopy due to concerns about cardiovascular status with low EF and pulmonary hypertension in the setting of relatively stable Hgb -Appreciate palliative consultation for goals of care  .  Continue full scope of care for now per discussion.    - Cortisol 14.5 and 2D echocardiogram with low EF, but similar to prior. - Patient has been weaned off Levophed  as early hours of of 02/20/2024  - BP stable on midodrine    2)Acute on chronic Anemia--Hgb drifting down slowly ,  Hgb 10.2 >>9.3 >>9.2 >>8.5 >>8.0>>8.6 -No obvious bleeding at this time -As per GI team anesthesia is reluctant to perform endoscopy here at [facility] due to severe pulmonary hypertension and congestive heart failure concerns with low EF- --if endoscopy is required patient may have to transfer to [facility] --Underlying chronic anemia may be partly due to CKD -Transfuse as clinically indicated -Prior anemia workup revealed that folate and B12 were not low -Ferritin was low at 10 on 10/20/2022, ferritin is now at 74 -Serum iron  is up to 64 from 26 from was ago and saturation is up to 20 from 7 about 4 months ago -Continue iron  with multivitamin supplementation   3)Chronic HFrEF - 09/26/2023 echo EF 30-35%, normal RVF, moderate MR Repeat Echo on 02/19/24--- EF is still 30 to 35%,  -left Ventricle: No LV thrombus by Definity . Left ventricular ejection fraction, by estimation, is 30 to 35%. The left ventricle has moderately decreased function. The left ventricle demonstrates regional wall motion abnormalities.  There is no left  ventricular hypertrophy. Abnormal (paradoxical) septal motion, consistent with left bundle branch block.   LV Wall Scoring:  The entire anterior wall and anterior septum are akinetic. The inferior  septum, entire inferior wall, and posterior wall are hypokinetic. The apex  is normal.  -Okay to resume Entresto  and Toprol -XL at 12.5 mg twice daily- -Aldactone  on hold due to BP concerns -Continue midodrine  for pressure support   4)Paroxysmal atrial fibrillation -- Continue with amiodarone  and Toprol -XL for rate control -By reviewing records it does appear she has not been on Eliquis  for few months currently, and I would assume this is most likely in the setting of her previous anemia due to GI bleed and previously refusing anemia workup as it does appear risks outweighed benefits anticoagulation - Eliquis  discontinued due to ongoing GI bleed concerns   5)Schizophrenia, unspecified type - Behavioral issues improving -- continue olanzapine  - Continue Depakote  - Patient's sister is now her legal guardian   6)AKI on CKD stage IIIb with hyponatremia and metabolic acidosis -  Likely in the setting of hypotension  - Creatinine is down to 1.2 from 2.73 on 02/19/2024 -- renally adjust medications, avoid nephrotoxic agents / dehydration  / hypotension    7)Diabetes mellitus type 2, controlled - A1c--5.2  -Hold Metformin  Use Novolog /Humalog Sliding scale insulin  with Accu-Cheks/Fingersticks as ordered    8)Morbid obesity-class III -Low calorie diet, portion control and increase physical activity discussed with patient -Body mass index is 43.44 kg/m.   9)Hypothyroidism-TSH 8.64 from 47.0 on 10/26/2023 - levothyroxine  75 mcg daily   10)Acute urinary retention--Foley removed a.m. of 02/21/2024 --Patient is voiding well, continue Flomax    Disposition--patient originally from group home, however due to lack of cooperation with care and requiring higher level of care group home unable to take her  back  Discharge Condition: Hemodynamically stable  Follow UP   Follow-up Information     Gammon, Chrystal, NP. Schedule an appointment as soon as possible for a visit in 1 week(s).   Specialty: Nurse Practitioner Why: Repeat CBC and BMP Blood Tests Contact information: [address] [place] [address] [phone]                 Consults obtained -GI and palliative care service  Diet and Activity recommendation:  As advised  Discharge Instructions    Discharge Instructions     Call MD for:  difficulty breathing, headache or visual disturbances   Complete by: As directed    Call MD for:  persistant dizziness or light-headedness   Complete by: As directed    Call MD for:  persistant nausea and vomiting   Complete by: As directed    Call MD for:  temperature >100.4   Complete by: As directed    Diet - low sodium heart healthy   Complete by: As directed    Discharge instructions   Complete by: As directed    1)Avoid ibuprofen/Advil/Aleve/Motrin/Goody Powders/Naproxen/BC powders/Meloxicam/Diclofenac/Indomethacin and other Nonsteroidal anti-inflammatory medications as these will make you more likely to bleed and can cause stomach ulcers, can also cause Kidney problems.   2)Watch for bleeding while on Blood Thinners--watch for blood in your stool which can make your stool black, maroon, mahogany or red---, blood in your urine which can make your urine pink or red, nosebleeds , also watch for possible bruising -You are taking Aspirin -- which is a blood thinner--- be careful to avoid injury or falls  3)Repeat CBC and BMP Blood Tests every Friday x 3 weeks ---starting 03/01/24   Discharge wound care:   Complete by: As directed    As advised   Increase activity slowly   Complete by: As directed        Discharge Medications     Allergies as of 02/27/2024       Reactions   Haloperidol Lactate Other (See Comments)   Couldn't urinate anymore         Medication List     STOP taking these medications    docusate sodium  100 MG capsule Commonly known as: COLACE   Eliquis  5 MG Tabs tablet Generic drug: apixaban    metoprolol  tartrate 25 MG tablet Commonly known as: LOPRESSOR    spironolactone  25 MG tablet Commonly known as: ALDACTONE        TAKE these medications    acetaminophen  325 MG tablet Commonly known as: TYLENOL  Take 2 tablets (650 mg total) by mouth every 6 (six) hours as needed for mild pain (pain score 1-3) or fever (or Fever >/= 101).   albuterol  (2.5 MG/3ML)  0.083% nebulizer solution Commonly known as: PROVENTIL  Take 3 mLs (2.5 mg total) by nebulization every 2 (two) hours as needed for wheezing.   amiodarone  200 MG tablet Commonly known as: PACERONE  Take 1 tablet (200 mg total) by mouth daily. What changed: See the new instructions.   ammonium lactate 12 % lotion Commonly known as: LAC-HYDRIN Apply 1 Application topically as needed for dry skin.   ascorbic acid  500 MG tablet Commonly known as: VITAMIN C  Take 1 tablet (500 mg total) by mouth daily.   aspirin  EC 81 MG tablet Take 1 tablet (81 mg total) by mouth daily with breakfast. Swallow whole. What changed: when to take this   atorvastatin  20 MG tablet Commonly known as: LIPITOR Take 20 mg by mouth daily.   cholecalciferol  10 MCG (400 UNIT) Tabs tablet Commonly known as: VITAMIN D3 Take 1,000 Units by mouth daily.   [employer] MEDICATED CHEST RUB EX Apply topically.   cyanocobalamin  500 MCG tablet Commonly known as: VITAMIN B12 Take 1 tablet (500 mcg total) by mouth daily.   divalproex  250 MG 24 hr tablet Commonly known as: DEPAKOTE  ER Take 1 tablet (250 mg total) by mouth daily.   FeroSul 325 (65 Fe) MG tablet Generic drug: ferrous sulfate  Take 1 tablet (325 mg total) by mouth daily with breakfast. What changed: when to take this   gabapentin  300 MG capsule Commonly known as: Neurontin  Take 1 capsule (300 mg total) by mouth at  bedtime. What changed:  medication strength how much to take   latanoprost  0.005 % ophthalmic solution Commonly known as: XALATAN  Place 1 drop into both eyes at bedtime.   levothyroxine  75 MCG tablet Commonly known as: SYNTHROID  Take 1 tablet (75 mcg total) by mouth daily before breakfast. What changed:  medication strength how much to take when to take this   loratadine  10 MG tablet Commonly known as: CLARITIN  Take 10 mg by mouth daily.   metFORMIN 500 MG tablet Commonly known as: GLUCOPHAGE Take 500 mg by mouth 2 (two) times daily with a meal.   metoprolol  succinate 25 MG 24 hr tablet Commonly known as: TOPROL -XL Take 0.5 tablets (12.5 mg total) by mouth 2 (two) times daily.   midodrine  10 MG tablet Commonly known as: PROAMATINE  Take 1 tablet (10 mg total) by mouth 3 (three) times daily with meals.   multivitamin tablet Take 1 tablet by mouth daily.   OLANZapine  5 MG tablet Commonly known as: ZYPREXA  Take 1 tablet (5 mg total) by mouth at bedtime. Total of 25 mg at night. Take along with 20 mg tab What changed: Another medication with the same name was changed. Make sure you understand how and when to take each.   OLANZapine  20 MG tablet Commonly known as: ZYPREXA  Take 1 tablet (20 mg total) by mouth at bedtime. Total of 25 mg at night. Take along with 20 mg tab What changed: additional instructions   pantoprazole  40 MG tablet Commonly known as: PROTONIX  Take 1 tablet (40 mg total) by mouth 2 (two) times daily. What changed: when to take this   polyethylene glycol powder 17 GM/SCOOP powder Commonly known as: GLYCOLAX /MIRALAX  Take 17 g by mouth every Tuesday, Thursday, Saturday, and Sunday at 6 PM. What changed:  when to take this reasons to take this   sacubitril -valsartan  24-26 MG Commonly known as: ENTRESTO  Take 1 tablet by mouth 2 (two) times daily.   senna-docusate 8.6-50 MG tablet Commonly known as: Senokot-S Take 2 tablets by mouth at bedtime.  sodium bicarbonate  650 MG tablet Take 1 tablet (650 mg total) by mouth 2 (two) times daily.   tamsulosin  0.4 MG Caps capsule Commonly known as: FLOMAX  Take 1 capsule (0.4 mg total) by mouth daily after supper.   zinc  sulfate (50mg  elemental zinc ) 220 (50 Zn) MG capsule Take 220 mg by mouth daily.               Discharge Care Instructions  (From admission, onward)           Start     Ordered   02/27/24 0000  Discharge wound care:       Comments: As advised   02/27/24 1419           Major procedures and Radiology Reports - PLEASE review detailed and final reports for all details, in brief -   ECHOCARDIOGRAM COMPLETE Result Date: 02/19/2024    ECHOCARDIOGRAM REPORT   Patient Name:   NAOMIE DELENA HURST Date of Exam: 02/19/2024 Medical Rec #:  [id number]      Height:       60.0 in Accession #:    [id number]     Weight:       222.4 lb Date of Birth:  Dec 11, 1940       BSA:          1.953 m Patient Age:    83 years       BP:           111/58 mmHg Patient Gender: F              HR:           99 bpm. Exam Location:  Inpatient Procedure: 2D Echo, Color Doppler, Cardiac Doppler and Intracardiac            Opacification Agent (Both Spectral and Color Flow Doppler were            utilized during procedure). Indications:     Other abnormalities of the heart R00.8  History:         Patient has prior history of Echocardiogram examinations, most                  recent 09/26/2023. Risk Factors:Hypertension, Dyslipidemia and                  Diabetes.  Sonographer:     Tinnie Gosling RDCS Referring Phys:  [id number] PRATIK D [facility] Diagnosing Phys: Diannah Late Mallipeddi IMPRESSIONS  1. No LV thrombus by Definity . Left ventricular ejection fraction, by estimation, is 30 to 35%. The left ventricle has moderately decreased function. The left ventricle demonstrates regional wall motion abnormalities (see scoring diagram/findings for description). Left ventricular diastolic parameters are indeterminate.  2.  Right ventricular systolic function was not well visualized. The right ventricular size is mildly enlarged.  3. The mitral valve is grossly normal. Trivial mitral valve regurgitation. No evidence of mitral stenosis.  4. The aortic valve was not well visualized. Aortic valve regurgitation is not visualized. Comparison(s): No significant change from prior study. FINDINGS  Left Ventricle: No LV thrombus by Definity . Left ventricular ejection fraction, by estimation, is 30 to 35%. The left ventricle has moderately decreased function. The left ventricle demonstrates regional wall motion abnormalities. Strain was performed and the global longitudinal strain is indeterminate. The left ventricular internal cavity size was normal in size. There is no left ventricular hypertrophy. Abnormal (paradoxical) septal motion, consistent with left bundle branch block. Left ventricular diastolic parameters are indeterminate.  LV Wall Scoring: The entire anterior wall and anterior septum are akinetic. The inferior septum, entire inferior wall, and posterior wall are hypokinetic. The apex is normal. Right Ventricle: The right ventricular size is mildly enlarged. No increase in right ventricular wall thickness. Right ventricular systolic function was not well visualized. Left Atrium: Left atrial size was normal in size. Right Atrium: Right atrial size was normal in size. Pericardium: There is no evidence of pericardial effusion. Mitral Valve: The mitral valve is grossly normal. Trivial mitral valve regurgitation. No evidence of mitral valve stenosis. Tricuspid Valve: The tricuspid valve is not well visualized. Tricuspid valve regurgitation is mild . No evidence of tricuspid stenosis. Aortic Valve: The aortic valve was not well visualized. Aortic valve regurgitation is not visualized. Pulmonic Valve: The pulmonic valve was not well visualized. Pulmonic valve regurgitation is not visualized. No evidence of pulmonic stenosis. Aorta: The  aortic root and ascending aorta are structurally normal, with no evidence of dilitation. Venous: The inferior vena cava was not well visualized. IAS/Shunts: The interatrial septum was not well visualized. Additional Comments: 3D was performed not requiring image post processing on an independent workstation and was indeterminate.  LEFT VENTRICLE PLAX 2D LVIDd:         5.40 cm   Diastology LVIDs:         4.50 cm   LV e' medial:    4.46 cm/s LV PW:         1.10 cm   LV E/e' medial:  20.8 LV IVS:        1.10 cm   LV e' lateral:   8.81 cm/s LVOT diam:     1.90 cm   LV E/e' lateral: 10.5 LV SV:         40 LV SV Index:   20 LVOT Area:     2.84 cm LV IVRT:       98 msec  RIGHT VENTRICLE RV S prime:     12.90 cm/s  PULMONARY VEINS TAPSE (M-mode): 1.2 cm      Diastolic Velocity: 31.00 cm/s                             S/D Velocity:       0.90                             Systolic Velocity:  28.90 cm/s LEFT ATRIUM           Index LA diam:      4.80 cm 2.46 cm/m LA Vol (A4C): 38.7 ml 19.82 ml/m  AORTIC VALVE LVOT Vmax:   98.70 cm/s LVOT Vmean:  65.100 cm/s LVOT VTI:    0.141 m  AORTA Ao Root diam: 2.80 cm Ao Asc diam:  3.20 cm MITRAL VALVE               TRICUSPID VALVE MV Area (PHT): 5.79 cm    TR Peak grad:   17.6 mmHg MV Decel Time: 131 msec    TR Vmax:        210.00 cm/s MV E velocity: 92.90 cm/s MV A velocity: 33.80 cm/s  SHUNTS MV E/A ratio:  2.75        Systemic VTI:  0.14 m                            Systemic  Diam: 1.90 cm Vishnu Priya Mallipeddi Electronically signed by Diannah Late Mallipeddi Signature Date/Time: 02/19/2024/3:27:47 PM    Final (Updated)    CT ANGIO GI BLEED Result Date: 02/16/2024 EXAM: CTA ABDOMEN AND PELVIS WITH CONTRAST 02/16/2024 07:33:33 PM TECHNIQUE: CTA images of the abdomen and pelvis with intravenous contrast. Three-dimensional MIP/volume rendered formations were performed. Automated exposure control, iterative reconstruction, and/or weight based adjustment of the mA/kV was utilized to  reduce the radiation dose to as low as reasonably achievable. 75 mL of iohexol  (OMNIPAQUE ) 350 MG/ML injection was administered. COMPARISON: 02/20/2017 CLINICAL HISTORY: fever. melena FINDINGS: VASCULATURE: GI BLEED: No active contrast extravasation to localize GI bleed. AORTA: Aortic atherosclerosis. No acute finding. No abdominal aortic aneurysm. No dissection. CELIAC TRUNK: No acute finding. No occlusion or significant stenosis. SUPERIOR MESENTERIC ARTERY: No acute finding. No occlusion or significant stenosis. INFERIOR MESENTERIC ARTERY: No acute finding. No occlusion or significant stenosis. RENAL ARTERIES: No acute finding. No occlusion or significant stenosis. ILIAC ARTERIES: No acute finding. No occlusion or significant stenosis. ABDOMEN/PELVIS: LOWER CHEST: Ground glass opacities in the right lower lobe could reflect atelectasis or infiltrates. Mild cardiomegaly. LIVER: The liver is unremarkable. GALLBLADDER AND BILE DUCTS: Layering high-density material within the gallbladder, presumably small stones or sludge. No biliary ductal dilatation. SPLEEN: The spleen is unremarkable. PANCREAS: The pancreas is unremarkable. ADRENAL GLANDS: Bilateral adrenal glands demonstrate no acute abnormality. KIDNEYS, URETERS AND BLADDER: Foley catheter in the bladder, which is decompressed. No stones in the kidneys or ureters. No hydronephrosis. No perinephric or periureteral stranding. GI AND BOWEL: Large stool burden throughout the colon. Large stool burden in the rectosigmoid colon with wall thickening suggesting stercoral colitis. Stomach and duodenal sweep demonstrate no acute abnormality. There is no bowel obstruction. No abnormal bowel wall distension. REPRODUCTIVE: Reproductive organs are unremarkable. PERITONEUM AND RETROPERITONEUM: No ascites or free air. LYMPH NODES: No lymphadenopathy. BONES AND SOFT TISSUES: No acute abnormality of the bones. No acute soft tissue abnormality. IMPRESSION: 1. No active GI bleeding.  2. Large stool burden in the rectosigmoid colon with wall thickening, suggestive of stercoral colitis. 3. Right lower lobe ground-glass opacities, which may represent atelectasis or infiltrate. 4. Mild cardiomegaly. 5. Cholelithiasis Electronically signed by: Franky Crease MD 02/16/2024 07:39 PM EST RP Workstation: [id number]   DG Chest Portable 1 View Result Date: 02/16/2024 CLINICAL DATA:  Altered mental status.  Gastrointestinal bleeding. EXAM: PORTABLE CHEST 1 VIEW COMPARISON:  10/20/2023. FINDINGS: Very poor depth of inspiration. Stable enlarged cardiac silhouette. Clear lungs with normal vascularity. Diffuse osteopenia. Moderate right glenohumeral degenerative changes. IMPRESSION: 1. No acute abnormality. 2. Stable cardiomegaly. Electronically Signed   By: Elspeth Bathe M.D.   On: 02/16/2024 15:07    Micro Results   Recent Results (from the past 240 hours)  MRSA Next Gen by PCR, Nasal     Status: None   Collection Time: 02/18/24  8:10 AM   Specimen: Nasal Mucosa; Nasal Swab  Result Value Ref Range Status   MRSA by PCR Next Gen NOT DETECTED NOT DETECTED Final    Comment: (NOTE) The GeneXpert MRSA Assay (FDA approved for NASAL specimens only), is one component of a comprehensive MRSA colonization surveillance program. It is not intended to diagnose MRSA infection nor to guide or monitor treatment for MRSA infections. Test performance is not FDA approved in patients less than 39 years old. Performed at [facility], [address] [place], [place] [address]    Today   Subjective    Maria Pace today has no new  complaints - Ambulatory with mobility specialist - No bleeding concerns,          Patient has been seen and examined prior to discharge   Objective   Blood pressure (!) 111/49, pulse 63, temperature (!) 97.5 F (36.4 C), temperature source Oral, resp. rate (!) 21, height 5' (1.524 m), weight 100.9 kg, SpO2 97%.   Intake/Output Summary (Last 24 hours) at 02/27/2024  1426 Last data filed at 02/27/2024 1300 Gross per 24 hour  Intake 720 ml  Output 800 ml  Net -80 ml    Exam Gen:- Awake Alert, no acute distress  HEENT:- [place].AT, No sclera icterus Neck-Supple Neck,No JVD,.  Lungs-  CTAB , good air movement bilaterally CV- S1, S2 normal, irregular Abd-  +ve B.Sounds, Abd Soft, No tenderness,    Extremity/Skin:-Mostly resolved edema,   good pulses Psych-affect is appropriate, oriented x3 Neuro-generalized weakness, no new focal deficits, no tremors    Data Review   CBC w Diff:  Lab Results  Component Value Date   WBC 6.0 02/22/2024   HGB 8.6 (L) 02/25/2024   HGB 10.2 (L) 08/11/2022   HCT 27.2 (L) 02/25/2024   HCT 30.7 (L) 08/11/2022   PLT 191 02/22/2024   PLT 278 08/11/2022   LYMPHOPCT 9 02/16/2024   LYMPHOPCT 25.6 07/20/2013   MONOPCT 13 02/16/2024   MONOPCT 6.2 07/20/2013   EOSPCT 0 02/16/2024   EOSPCT 1.4 07/20/2013   BASOPCT 0 02/16/2024   BASOPCT 0.6 07/20/2013   CMP:  Lab Results  Component Value Date   NA 137 02/23/2024   NA 136 01/25/2023   NA 134 (L) 11/01/2013   K 4.3 02/23/2024   K 4.3 11/01/2013   CL 104 02/23/2024   CL 100 11/01/2013   CO2 25 02/23/2024   CO2 25 11/01/2013   BUN 14 02/23/2024   BUN 23 01/25/2023   BUN 15 11/01/2013   CREATININE 1.22 (H) 02/23/2024   CREATININE 1.34 (H) 05/17/2018   PROT 7.0 02/16/2024   PROT 6.6 01/12/2023   PROT 7.9 10/28/2013   ALBUMIN 2.6 (L) 02/23/2024   ALBUMIN 4.1 01/12/2023   ALBUMIN 3.4 10/28/2013   BILITOT 0.4 02/16/2024   BILITOT <0.2 01/12/2023   BILITOT 0.6 10/28/2013   ALKPHOS 88 02/16/2024   ALKPHOS 125 (H) 10/28/2013   AST 49 (H) 02/16/2024   AST 23 10/28/2013   ALT 33 02/16/2024   ALT 29 10/28/2013   Total Discharge time is about 33 minutes  Rendall Carwin M.D on 02/27/2024 at 2:26 PM  Go to [email or web address] -  for contact info  [facility] - Office  [phone]

## 2024-02-27 NOTE — Plan of Care (Signed)
  Problem: Clinical Measurements: Goal: Ability to maintain clinical measurements within normal limits will improve Outcome: Progressing Goal: Will remain free from infection Outcome: Progressing Goal: Diagnostic test results will improve Outcome: Progressing Goal: Respiratory complications will improve Outcome: Progressing Goal: Cardiovascular complication will be avoided Outcome: Progressing   Problem: Nutrition: Goal: Adequate nutrition will be maintained Outcome: Progressing   Problem: Coping: Goal: Level of anxiety will decrease Outcome: Progressing   Problem: Elimination: Goal: Will not experience complications related to urinary retention Outcome: Progressing   Problem: Pain Managment: Goal: General experience of comfort will improve and/or be controlled Outcome: Progressing   Problem: Safety: Goal: Ability to remain free from injury will improve Outcome: Progressing

## 2024-02-27 NOTE — Progress Notes (Signed)
 Mobility Specialist Progress Note:    02/27/24 1210  Mobility  Activity Ambulated with assistance;Pivoted/transferred to/from [facility]  Level of Assistance Minimal assist, patient does 75% or more  Assistive Device Front wheel walker  Distance Ambulated (ft) 30 ft  Range of Motion/Exercises Active;All extremities  Activity Response Tolerated well  Mobility Referral Yes  Mobility visit 1 Mobility  Mobility Specialist Start Time (ACUTE ONLY) 1210  Mobility Specialist Stop Time (ACUTE ONLY) 1235  Mobility Specialist Time Calculation (min) (ACUTE ONLY) 25 min   Pt received in chair, agreeable to mobility. Required MinA to stand and SBA to ambulate with RW. Tolerated well, asx throughout. Returned to chair, call bell in reach. All needs met.  Landrum Carbonell Mobility Specialist Please contact via Special Educational Needs Teacher or  Rehab office at [phone]

## 2024-02-28 DIAGNOSIS — I480 Paroxysmal atrial fibrillation: Secondary | ICD-10-CM | POA: Diagnosis not present

## 2024-02-28 DIAGNOSIS — D649 Anemia, unspecified: Secondary | ICD-10-CM | POA: Diagnosis not present

## 2024-02-28 DIAGNOSIS — K5289 Other specified noninfective gastroenteritis and colitis: Secondary | ICD-10-CM | POA: Diagnosis not present

## 2024-02-28 DIAGNOSIS — I5022 Chronic systolic (congestive) heart failure: Secondary | ICD-10-CM | POA: Diagnosis not present

## 2024-02-28 DIAGNOSIS — F203 Undifferentiated schizophrenia: Secondary | ICD-10-CM

## 2024-02-28 DIAGNOSIS — K219 Gastro-esophageal reflux disease without esophagitis: Secondary | ICD-10-CM

## 2024-02-28 LAB — GLUCOSE, CAPILLARY
Glucose-Capillary: 125 mg/dL — ABNORMAL HIGH (ref 70–99)
Glucose-Capillary: 344 mg/dL — ABNORMAL HIGH (ref 70–99)
Glucose-Capillary: 164 mg/dL — ABNORMAL HIGH (ref 70–99)
Glucose-Capillary: 154 mg/dL — ABNORMAL HIGH (ref 70–99)

## 2024-02-28 NOTE — TOC Progression Note (Addendum)
 Transition of Care [facility]) - Progression Note    Patient Details  Name: Maria Pace MRN: [id number] Date of Birth: 1941/03/19  Transition of Care [facility]) CM/SW Contact  Lucie Lunger, [place] Phone Number: 02/28/2024, 10:13 AM  Clinical Narrative:    CSW requested that PT see pt today as a new eval is needed in order to start SNF insurance auth. Pt has a few bed offers. CSW reached out to pts sister/legal guardian and left a secure VM requesting call back so CSW can review bed offers and get choice for SNF. TOC to follow.   Addendum 12:30pm- CSW attempted to reach pts sister/LG again, VM left. CSW also sent secure text requesting call back as soon as possible.   Addendum 1:30pm- CSW spoke to pts sister/LG to review bed offers. She accepts bed offer at [facility]. CSW updated HUB to reflect choice. CSW updated Debbie in admissions of this also. Insurance shara has been started at this time. TOC to follow.   Expected Discharge Plan: Group Home Barriers to Discharge: Continued Medical Work up               Expected Discharge Plan and Services In-house Referral: Clinical Social Work Discharge Planning Services: CM Consult Post Acute Care Choice: Home Health Living arrangements for the past 2 months: Group Home Expected Discharge Date: 02/27/24                                     Social Drivers of Health (SDOH) Interventions SDOH Screenings   Food Insecurity: No Food Insecurity (02/16/2024)  Housing: Low Risk  (02/16/2024)  Transportation Needs: No Transportation Needs (02/16/2024)  Recent Concern: Transportation Needs - Unmet Transportation Needs (02/16/2024)  Utilities: Not At Risk (02/16/2024)  Depression (PHQ2-9): High Risk (09/06/2021)  Social Connections: Unknown (02/16/2024)  Tobacco Use: Low Risk  (02/21/2024)    Readmission Risk Interventions    02/18/2024   10:48 AM 02/17/2024   12:13 PM 07/14/2022   11:16 AM  Readmission Risk Prevention Plan   Transportation Screening Complete Complete Complete  PCP or Specialist Appt within 5-7 Days   Not Complete  PCP or Specialist Appt within 3-5 Days Complete    Home Care Screening   Complete  Medication Review (RN CM)   Complete  HRI or Home Care Consult Complete Complete   Social Work Consult for Recovery Care Planning/Counseling Complete Complete   Palliative Care Screening Not Applicable Not Applicable   Medication Review Oceanographer) Complete Complete

## 2024-02-28 NOTE — Plan of Care (Signed)
  Problem: Health Behavior/Discharge Planning: Goal: Ability to manage health-related needs will improve Outcome: Progressing   Problem: Clinical Measurements: Goal: Ability to maintain clinical measurements within normal limits will improve Outcome: Progressing Goal: Will remain free from infection Outcome: Progressing Goal: Diagnostic test results will improve Outcome: Progressing Goal: Respiratory complications will improve Outcome: Progressing Goal: Cardiovascular complication will be avoided Outcome: Progressing   Problem: Activity: Goal: Risk for activity intolerance will decrease Outcome: Progressing   Problem: Coping: Goal: Level of anxiety will decrease Outcome: Progressing   Problem: Elimination: Goal: Will not experience complications related to bowel motility Outcome: Progressing   Problem: Safety: Goal: Ability to remain free from injury will improve Outcome: Progressing   Problem: Skin Integrity: Goal: Risk for impaired skin integrity will decrease Outcome: Progressing   Problem: Coping: Goal: Ability to adjust to condition or change in health will improve Outcome: Progressing   Problem: Fluid Volume: Goal: Ability to maintain a balanced intake and output will improve Outcome: Progressing   Problem: Health Behavior/Discharge Planning: Goal: Ability to manage health-related needs will improve Outcome: Progressing   Problem: Metabolic: Goal: Ability to maintain appropriate glucose levels will improve Outcome: Progressing   Problem: Nutritional: Goal: Maintenance of adequate nutrition will improve Outcome: Progressing Goal: Progress toward achieving an optimal weight will improve Outcome: Progressing   Problem: Skin Integrity: Goal: Risk for impaired skin integrity will decrease Outcome: Progressing

## 2024-02-28 NOTE — Progress Notes (Signed)
 Mobility Specialist Progress Note:    02/28/24 0911  Mobility  Activity Ambulated with assistance;Pivoted/transferred to/from [facility]  Level of Assistance Standby assist, set-up cues, supervision of patient - no hands on  Assistive Device Front wheel walker  Distance Ambulated (ft) 30 ft  Range of Motion/Exercises Active;All extremities  Activity Response Tolerated well  Mobility Referral Yes  Mobility visit 1 Mobility  Mobility Specialist Start Time (ACUTE ONLY) 0911  Mobility Specialist Stop Time (ACUTE ONLY) 0931  Mobility Specialist Time Calculation (min) (ACUTE ONLY) 20 min   Pt received in bed, agreeable to mobility. Required SBA to stand and ambulate with RW. Tolerated well, asx throughout. Left in chair, all needs met.  Rayleen Wyrick Mobility Specialist Please contact via Special Educational Needs Teacher or  Rehab office at [phone]

## 2024-02-28 NOTE — Hospital Course (Signed)
 83 y.o. female with medical history significant of bipolar 1 disorder, schizophrenia, essential hypertension, GERD, hyperlipidemia, history of irregular heartbeat, type 2 diabetes mellitus, PAF on apixaban , HFrEF (EF 30-35%), HTN, patient with hospitalization last July, due to acute anemia from GI bleed, for which she refused endoscopy/colonoscopy . - Was sent from her group home as she was found blood with large amount of diarrhea, dark stools, poor historian due to schizophrenia, she is answering question appropriately but cannot give exact details, she had anemia in July, refused GI evaluation, unfortunately there is no medication list by the patient, by by reviewing dispense record it does appear she is not anymore on her metoprolol  or Eliquis . - In ED she had fever 101.4, CTA GI bleed protocol with no evidence of active extravasation, but was significant for sterile coral colitis, patient had manual disimpaction by ED physician, no stool in color, was followed by gray loose stool, no melena, but she was Hemoccult positive, her globin was stable, creatinine at baseline, [facility] consulted to admit

## 2024-02-28 NOTE — Progress Notes (Signed)
 Physical Therapy Treatment Patient Details Name: Maria Pace MRN: [id number] DOB: 10-12-1940 Today's Date: 02/28/2024   History of Present Illness Maria Pace  is a 83 y.o. female,    with medical history significant of bipolar 1 disorder, schizophrenia, essential hypertension, GERD, hyperlipidemia, history of irregular heartbeat, type 2 diabetes mellitus, PAF on apixaban , HFrEF (EF 30-35%), HTN, patient with hospitalization last July, due to acute anemia from GI bleed, for which she refused endoscopy/colonoscopy .  - Was sent from her group home as she was found blood with large amount of diarrhea, dark stools, poor historian due to schizophrenia, she is answering question appropriately but cannot give exact details, she had anemia in July, refused GI evaluation, unfortunately there is no medication list by the patient, by by reviewing dispense record it does appear she is not anymore on her metoprolol  or Eliquis .  - In ED she had fever 101.4, CTA GI bleed protocol with no evidence of active extravasation, but was significant for sterile coral colitis, patient had manual disimpaction by ED physician, no stool in color, was followed by gray loose stool, no melena, but she was Hemoccult positive, her globin was stable, creatinine at baseline, [facility] consulted to admit    PT Comments  Pt. presented general weakness in LE, pt tolerated session, and was motivated to ambulate. Pt was in chair prior to treatment. Pt was able to maintain good sitting balance during warm-ups to improve LE strength. During transfer from sit to stand pt. Required min A/ mod A w/ RW due to weakness. Pt was able to initially ambulate forward and backwards w/ RW and Min A  and took a rest break in b/w. Towards the end of session pt was bale to attempt to ambulate towards doorway w/ RW and CGA/ Min A w/ gait belt. Pt. Was left in chair and call bell at bedside, nursing staff was notified on pt status. Patient will benefit  from continued skilled physical therapy in hospital and recommended venue below to continue to increase strength, balance, endurance for safe ADLs and gait.    If plan is discharge home, recommend the following: A lot of help with bathing/dressing/bathroom;A lot of help with walking and/or transfers;Help with stairs or ramp for entrance;Assist for transportation;Assistance with cooking/housework   Can travel by private vehicle     Yes  Equipment Recommendations  None recommended by PT    Recommendations for Other Services       Precautions / Restrictions Precautions Precautions: Fall Recall of Precautions/Restrictions: Intact Restrictions Weight Bearing Restrictions Per Provider Order: No     Mobility  Bed Mobility Overal bed mobility:  (was in chair prior to treatmen)                  Transfers Overall transfer level: Needs assistance Equipment used: Rolling walker (2 wheels) Transfers: Sit to/from Stand Sit to Stand: Min assist, Mod assist           General transfer comment: pt. initally had diffculty w/ sit to stand from chair, and needed assistance to the edge of chair    Ambulation/Gait Ambulation/Gait assistance: Min assist, Contact guard assist Gait Distance (Feet): 20 Feet Assistive device: Rolling walker (2 wheels)   Gait velocity: slow     General Gait Details: pt ws able to ambulate forward/backwards and ambulate into the hallway w/ RW. Pt returned to chair afterwards   Optometrist  Tilt Bed    Modified Rankin (Stroke Patients Only)       Balance Overall balance assessment: Modified Independent, Needs assistance Sitting-balance support: Bilateral upper extremity supported, Feet supported Sitting balance-Leahy Scale: Good Sitting balance - Comments: good -seated in chair during warm-ups   Standing balance support: Reliant on assistive device for balance, During functional activity, Bilateral  upper extremity supported Standing balance-Leahy Scale: Fair Standing balance comment: using RW                            Communication Communication Communication: No apparent difficulties  Cognition Arousal: Alert Behavior During Therapy: WFL for tasks assessed/performed   PT - Cognitive impairments: No apparent impairments                         Following commands: Intact      Cueing Cueing Techniques: Verbal cues, Tactile cues  Exercises General Exercises - Lower Extremity Ankle Circles/Pumps: AROM, Seated, Both, 5 reps Long Arc Quad: AROM, Strengthening, Both, 10 reps, Seated Hip Flexion/Marching: AROM, Strengthening, Both, Seated, 10 reps Toe Raises: AROM, Strengthening, Both, Seated, 10 reps Heel Raises: AROM, Strengthening, Both, Seated, 10 reps    General Comments        Pertinent Vitals/Pain Pain Assessment Pain Assessment: No/denies pain    Home Living                          Prior Function            PT Goals (current goals can now be found in the care plan section) Acute Rehab PT Goals Patient Stated Goal: Return to group home PT Goal Formulation: With patient Time For Goal Achievement: 03/05/24 Potential to Achieve Goals: Good Progress towards PT goals: Progressing toward goals    Frequency    Min 3X/week      PT Plan      Co-evaluation              AM-PAC PT 6 Clicks Mobility   Outcome Measure  Help needed turning from your back to your side while in a flat bed without using bedrails?: A Little Help needed moving from lying on your back to sitting on the side of a flat bed without using bedrails?: A Little Help needed moving to and from a bed to a chair (including a wheelchair)?: A Little Help needed standing up from a chair using your arms (e.g., wheelchair or bedside chair)?: A Little Help needed to walk in hospital room?: A Little Help needed climbing 3-5 steps with a railing? : A Lot 6  Click Score: 17    End of Session Equipment Utilized During Treatment: Gait belt Activity Tolerance: Patient tolerated treatment well;Patient limited by fatigue Patient left: in chair;with call bell/phone within reach;with chair alarm set Nurse Communication: Mobility status PT Visit Diagnosis: Unsteadiness on feet (R26.81);Other abnormalities of gait and mobility (R26.89);Muscle weakness (generalized) (M62.81)     Time: 8996-8967 PT Time Calculation (min) (ACUTE ONLY): 29 min  Charges:    $Gait Training: 8-22 mins $Therapeutic Exercise: 8-22 mins PT General Charges $$ ACUTE PT VISIT: 1 Visit                     Greysen Devino, SPT

## 2024-02-28 NOTE — Progress Notes (Signed)
 PROGRESS NOTE   Maria Pace  FMW:[id number] DOB: 20-Apr-1940 DOA: 02/16/2024 PCP: Gammon, Chrystal, NP   No chief complaint on file.  Level of care: Telemetry  Brief Admission History:  83 y.o. female with medical history significant of bipolar 1 disorder, schizophrenia, essential hypertension, GERD, hyperlipidemia, history of irregular heartbeat, type 2 diabetes mellitus, PAF on apixaban , HFrEF (EF 30-35%), HTN, patient with hospitalization last July, due to acute anemia from GI bleed, for which she refused endoscopy/colonoscopy . - Was sent from her group home as she was found blood with large amount of diarrhea, dark stools, poor historian due to schizophrenia, she is answering question appropriately but cannot give exact details, she had anemia in July, refused GI evaluation, unfortunately there is no medication list by the patient, by by reviewing dispense record it does appear she is not anymore on her metoprolol  or Eliquis . - In ED she had fever 101.4, CTA GI bleed protocol with no evidence of active extravasation, but was significant for sterile coral colitis, patient had manual disimpaction by ED physician, no stool in color, was followed by gray loose stool, no melena, but she was Hemoccult positive, her globin was stable, creatinine at baseline, [facility] hospitalist consulted to admit   Assessment and Plan:  Sepsis, POA due to Sterocoral colitis Hemoccult positive stool -Occult positive stool, but hemoglobin is stable, had manual disimpaction in ED on admission, impacted stool was brown in color, no melena or bright red blood per rectum, followed by diarrhea dark gray in color (is on iron  tablets), no melena, Hemoccult positive stool most likely due to colitis and rectal trauma from manual disimpaction. -Blood cultures NGTD - treated with IV Zosyn --overall much improved WBC is down to 5.3 from 11.0, transitioned to oral Augmentin  on 02/22/2024, last dose 02/25/2024 - Continue on  laxative regimen---avoid constipation -PTA aspirin  remains on hold - Appreciate GI recommendations to conservatively manage and follow CBC and continue PPI twice daily as well as laxatives -CTA GI bleeding protocol is negative -Anesthesia and GI team reluctant to do colonoscopy due to concerns about cardiovascular status with low EF and pulmonary hypertension in the setting of relatively stable Hgb -Appreciate palliative consultation for goals of care.  Continue full scope of care for now per discussion.    - Cortisol 14.5 and 2D echocardiogram with low EF, but similar to prior. - Patient has been weaned off Levophed  as early hours of of 02/20/2024  - BP stable on midodrine    Acute on chronic Anemia  Hgb 10.2 >>9.3 >>9.2 >>8.5 >>8.0>>8.6 -No obvious bleeding at this time -As per GI team anesthesia is reluctant to perform endoscopy here at [facility] due to severe pulmonary hypertension and congestive heart failure concerns with low EF- --if endoscopy is required patient may have to transfer to [facility] --Underlying chronic anemia may be partly due to CKD -Transfuse as clinically indicated -Prior anemia workup revealed that folate and B12 were not low -Ferritin was low at 10 on 10/20/2022, ferritin is now at 74 -Serum iron  is up to 64 from 26 from was ago and saturation is up to 20 from 7 about 4 months ago -Continue iron  with multivitamin supplementation   Chronic HFrEF - 09/26/2023 echo EF 30-35%, normal RVF, moderate MR Repeat Echo on 02/19/24--- EF is still 30 to 35%,  -left Ventricle: No LV thrombus by Definity . Left ventricular ejection fraction, by estimation, is 30 to 35%. The left ventricle has moderately decreased function. The left ventricle demonstrates regional wall motion  abnormalities.  There is no left ventricular hypertrophy. Abnormal (paradoxical) septal motion, consistent with left bundle branch block.   LV Wall Scoring:  The entire anterior wall and anterior septum are  akinetic. The inferior  septum, entire inferior wall, and posterior wall are hypokinetic. The apex  is normal.  -Okay to resume Entresto  and Toprol -XL at 12.5 mg twice daily- -Aldactone  on hold due to BP concerns -Continue midodrine  for pressure support   Paroxysmal atrial fibrillation -- Continue with amiodarone  and Toprol -XL for rate control -By reviewing records it does appear she has not been on Eliquis  for few months currently, and I would assume this is most likely in the setting of her previous anemia due to GI bleed and previously refusing anemia workup as it does appear risks outweighed benefits anticoagulation - Eliquis  discontinued due to ongoing GI bleed concerns   Schizophrenia, unspecified type - Behavioral issues improving -- continue olanzapine  - Continue Depakote  - Patient's sister is now her legal guardian   AKI on CKD stage IIIb with hyponatremia and metabolic acidosis -Likely in the setting of hypotension  - Creatinine is down to 1.2 from 2.73 on 02/19/2024 -- renally adjust medications, avoid nephrotoxic agents / dehydration  / hypotension    Diabetes mellitus type 2, controlled - A1c--5.2  -Hold Metformin  Use Novolog /Humalog Sliding scale insulin  with Accu-Cheks/Fingersticks as ordered    Morbid obesity-class III -Low calorie diet, portion control and increase physical activity discussed with patient -Body mass index is 43.44 kg/m.   Hypothyroidism-TSH 8.64 from 47.0 on 10/26/2023 - levothyroxine  75 mcg daily   Acute urinary retention--Foley removed a.m. of 02/21/2024 --Patient is voiding well, continue Flomax   DVT prophylaxis: SCDs Code Status: Full  Family Communication:  Disposition: SNF placement in process    Consultants:   Procedures:   Antimicrobials:    Subjective: No specific complaints.   Objective: Vitals:   02/27/24 0838 02/27/24 1321 02/27/24 2134 02/28/24 0517  BP: (!) 111/57 (!) 111/49 (!) 127/105 98/74  Pulse: 77 63  62   Resp:  (!) 21 18 18   Temp:  (!) 97.5 F (36.4 C) (!) 97.5 F (36.4 C) 97.9 F (36.6 C)  TempSrc:  Oral    SpO2:  97% 93% 93%  Weight:      Height:        Intake/Output Summary (Last 24 hours) at 02/28/2024 1157 Last data filed at 02/28/2024 0830 Gross per 24 hour  Intake 720 ml  Output 1750 ml  Net -1030 ml   Filed Weights   02/18/24 0758 02/19/24 0445 02/21/24 0936  Weight: 99.4 kg 100.9 kg 100.9 kg   Examination:  General exam: Appears calm and comfortable  Respiratory system: Clear to auscultation. Respiratory effort normal. Cardiovascular system: normal S1 & S2 heard. No JVD, murmurs, rubs, gallops or clicks. No pedal edema. Gastrointestinal system: Abdomen is nondistended, soft and nontender. No organomegaly or masses felt. Normal bowel sounds heard. Central nervous system: Alert and oriented. No focal neurological deficits. Extremities: Symmetric 5 x 5 power. Skin: No rashes, lesions or ulcers. Psychiatry: Judgement and insight appear normal. Mood & affect appropriate.   Data Reviewed: I have personally reviewed following labs and imaging studies  CBC: Recent Labs  Lab 02/22/24 0528 02/23/24 0421 02/24/24 0236 02/25/24 0442  WBC 6.0  --   --   --   HGB 9.2* 8.5* 8.0* 8.6*  HCT 28.8* 26.0* 25.4* 27.2*  MCV 92.6  --   --   --   PLT 191  --   --   --  Basic Metabolic Panel: Recent Labs  Lab 02/23/24 0421  NA 137  K 4.3  CL 104  CO2 25  GLUCOSE 96  BUN 14  CREATININE 1.22*  CALCIUM  8.5*  PHOS 2.5    CBG: Recent Labs  Lab 02/27/24 1142 02/27/24 1613 02/27/24 2136 02/28/24 0734 02/28/24 1134  GLUCAP 157* 106* 179* 125* 344*    No results found for this or any previous visit (from the past 240 hours).   Radiology Studies: No results found.  Scheduled Meds:  amiodarone   200 mg Oral Daily   Chlorhexidine  Gluconate Cloth  6 each Topical Daily   divalproex   250 mg Oral Daily   feeding supplement  237 mL Oral BID BM   insulin  aspart   0-9 Units Subcutaneous TID AC & HS   latanoprost   1 drop Both Eyes QHS   levothyroxine   75 mcg Oral Q0600   liver oil-zinc  oxide   Topical Q0600   metoprolol  succinate  12.5 mg Oral BID   midodrine   10 mg Oral TID WC   multivitamins with iron   1 tablet Oral Daily   OLANZapine   20 mg Oral QHS   OLANZapine   5 mg Oral QHS   pantoprazole   40 mg Oral BID   polyethylene glycol  17 g Oral Daily   senna-docusate  1 tablet Oral BID   sodium bicarbonate   650 mg Oral BID   tamsulosin   0.4 mg Oral QPC supper   Continuous Infusions:   LOS: 12 days   Time spent: 40 mins  Zaydah Nawabi Vicci, MD How to contact the [facility] Attending or Consulting provider 7A - 7P or covering provider during after hours 7P -7A, for this patient?  Check the care team in [facility] and look for a) attending/consulting [facility] provider listed and b) the [facility] team listed Log into [email or web address] to find provider on call.  Locate the [facility] provider you are looking for under [facility] and page to a number that you can be directly reached. If you still have difficulty reaching the provider, please page the [facility] (Director on Call) for the Hospitalists listed on amion for assistance.  02/28/2024, 11:57 AM

## 2024-02-29 DIAGNOSIS — K5289 Other specified noninfective gastroenteritis and colitis: Secondary | ICD-10-CM | POA: Diagnosis not present

## 2024-02-29 DIAGNOSIS — I480 Paroxysmal atrial fibrillation: Secondary | ICD-10-CM | POA: Diagnosis not present

## 2024-02-29 DIAGNOSIS — D649 Anemia, unspecified: Secondary | ICD-10-CM | POA: Diagnosis not present

## 2024-02-29 DIAGNOSIS — I5022 Chronic systolic (congestive) heart failure: Secondary | ICD-10-CM | POA: Diagnosis not present

## 2024-02-29 LAB — GLUCOSE, CAPILLARY
Glucose-Capillary: 86 mg/dL (ref 70–99)
Glucose-Capillary: 127 mg/dL — ABNORMAL HIGH (ref 70–99)
Glucose-Capillary: 137 mg/dL — ABNORMAL HIGH (ref 70–99)
Glucose-Capillary: 211 mg/dL — ABNORMAL HIGH (ref 70–99)

## 2024-02-29 NOTE — TOC Progression Note (Signed)
 Transition of Care [facility]) - Progression Note    Patient Details  Name: Maria Pace MRN: [id number] Date of Birth: 09/07/1940  Transition of [facility]) CM/SW Contact  Lucie Lunger, [place] Phone Number: 02/29/2024, 1:38 PM  Clinical Narrative:    Insurance shara is still pending at this time for SNF placement at [facility]. CSW awaiting updates. TOC to follow.   Expected Discharge Plan: Group Home Barriers to Discharge: No SNF bed               Expected Discharge Plan and Services In-house Referral: Clinical Social Work Discharge Planning Services: CM Consult Post Acute Care Choice: Home Health Living arrangements for the past 2 months: Group Home Expected Discharge Date: 02/27/24                                     Social Drivers of Health (SDOH) Interventions SDOH Screenings   Food Insecurity: No Food Insecurity (02/16/2024)  Housing: Low Risk  (02/16/2024)  Transportation Needs: No Transportation Needs (02/16/2024)  Recent Concern: Transportation Needs - Unmet Transportation Needs (02/16/2024)  Utilities: Not At Risk (02/16/2024)  Depression (PHQ2-9): High Risk (09/06/2021)  Social Connections: Unknown (02/16/2024)  Tobacco Use: Low Risk  (02/21/2024)    Readmission Risk Interventions    02/18/2024   10:48 AM 02/17/2024   12:13 PM 07/14/2022   11:16 AM  Readmission Risk Prevention Plan  Transportation Screening Complete Complete Complete  PCP or Specialist Appt within 5-7 Days   Not Complete  PCP or Specialist Appt within 3-5 Days Complete    Home Care Screening   Complete  Medication Review (RN CM)   Complete  HRI or Home Care Consult Complete Complete   Social Work Consult for Recovery Care Planning/Counseling Complete Complete   Palliative Care Screening Not Applicable Not Applicable   Medication Review Oceanographer) Complete Complete

## 2024-02-29 NOTE — Progress Notes (Signed)
 Occupational Therapy Treatment Patient Details Name: Maria Pace MRN: [id number] DOB: 04/13/1940 Today's Date: 02/29/2024   History of present illness Maria Pace  is a 83 y.o. female,    with medical history significant of bipolar 1 disorder, schizophrenia, essential hypertension, GERD, hyperlipidemia, history of irregular heartbeat, type 2 diabetes mellitus, PAF on apixaban , HFrEF (EF 30-35%), HTN, patient with hospitalization last July, due to acute anemia from GI bleed, for which she refused endoscopy/colonoscopy .  - Was sent from her group home as she was found blood with large amount of diarrhea, dark stools, poor historian due to schizophrenia, she is answering question appropriately but cannot give exact details, she had anemia in July, refused GI evaluation, unfortunately there is no medication list by the patient, by by reviewing dispense record it does appear she is not anymore on her metoprolol  or Eliquis .  - In ED she had fever 101.4, CTA GI bleed protocol with no evidence of active extravasation, but was significant for sterile coral colitis, patient had manual disimpaction by ED physician, no stool in color, was followed by gray loose stool, no melena, but she was Hemoccult positive, her globin was stable, creatinine at baseline, [facility] hospitalist consulted to admit   OT comments  Pt agreeable to OT treatment, but declined attempts at sit to stand with preference for remaining seated in the recliner. Pt did tolerate B UE strengthening with HEP left in the pt's room. Pt attempted lower body dressing but was unable to complete while seated in the chair. Pt left in the chair with call bell within reach. Pt will benefit from continued OT in the hospital to increase strength, balance, and endurance for safe ADL's.         If plan is discharge home, recommend the following:  A lot of help with walking and/or transfers;A lot of help with bathing/dressing/bathroom;Assistance with  cooking/housework;Direct supervision/assist for medications management;Direct supervision/assist for financial management;Assist for transportation;Help with stairs or ramp for entrance;Supervision due to cognitive status   Equipment Recommendations  None recommended by OT          Precautions / Restrictions Precautions Precautions: Fall Recall of Precautions/Restrictions: Intact Restrictions Weight Bearing Restrictions Per Provider Order: No       Mobility Bed Mobility               General bed mobility comments: Pt seated in the recliner at the start of the session.    Transfers                   General transfer comment: Pt declined to attempt sit to stand reps today.     Balance Overall balance assessment: Needs assistance Sitting-balance support: Feet supported, Bilateral upper extremity supported Sitting balance-Leahy Scale: Fair Sitting balance - Comments: seated in the recliner.                                   ADL either performed or assessed with clinical judgement   ADL Overall ADL's : Needs assistance/impaired                     Lower Body Dressing: Maximal assistance;Sitting/lateral leans Lower Body Dressing Details (indicate cue type and reason): Pt unable to complete during brief attempt while seatedin the recliner with legs elevated.  Vision   Vision Assessment?: No apparent visual deficits   Perception Perception Perception: Not tested   Praxis Praxis Praxis: Not tested   Communication Communication Communication: No apparent difficulties   Cognition Arousal: Alert Behavior During Therapy: WFL for tasks assessed/performed Cognition: No apparent impairments                               Following commands: Intact        Cueing   Cueing Techniques: Verbal cues, Tactile cues  Exercises Exercises: General Upper Extremity General Exercises -  Upper Extremity Shoulder Flexion: AROM, 10 reps, Both, Seated Shoulder ABduction: AROM, 10 reps, Both, Seated Shoulder Horizontal ABduction: AROM, Both, 10 reps, Seated (x10 protraction as well)                 Pertinent Vitals/ Pain       Pain Assessment Pain Assessment: No/denies pain                                                          Frequency  Min 1X/week        Progress Toward Goals  OT Goals(current goals can now be found in the care plan section)  Progress towards OT goals: Progressing toward goals  Acute Rehab OT Goals Patient Stated Goal: Go to rehab. OT Goal Formulation: Patient unable to participate in goal setting Time For Goal Achievement: 03/06/24 Potential to Achieve Goals: Fair ADL Goals Pt Will Perform Upper Body Bathing: with min assist;sitting Pt Will Perform Lower Body Dressing: with set-up;sitting/lateral leans Pt Will Transfer to Toilet: with min assist;bedside commode Pt/caregiver will Perform Home Exercise Program: Increased strength;Both right and left upper extremity;With written HEP provided  Plan                                      End of Session    OT Visit Diagnosis: Muscle weakness (generalized) (M62.81)   Activity Tolerance Patient limited by fatigue   Patient Left in chair;with call bell/phone within reach             Time: 1323-1336 OT Time Calculation (min): 13 min  Charges: OT General Charges $OT Visit: 1 Visit OT Treatments $Therapeutic Exercise: 8-22 mins  Nori Winegar OT, MOT   Jayson Person 02/29/2024, 3:20 PM

## 2024-02-29 NOTE — Patient Instructions (Addendum)

## 2024-02-29 NOTE — Plan of Care (Signed)
  Problem: Health Behavior/Discharge Planning: Goal: Ability to manage health-related needs will improve Outcome: Progressing   Problem: Clinical Measurements: Goal: Ability to maintain clinical measurements within normal limits will improve Outcome: Progressing Goal: Will remain free from infection Outcome: Progressing Goal: Diagnostic test results will improve Outcome: Progressing Goal: Respiratory complications will improve Outcome: Progressing Goal: Cardiovascular complication will be avoided Outcome: Progressing   Problem: Activity: Goal: Risk for activity intolerance will decrease Outcome: Progressing   Problem: Coping: Goal: Level of anxiety will decrease Outcome: Progressing   Problem: Pain Managment: Goal: General experience of comfort will improve and/or be controlled Outcome: Progressing   Problem: Safety: Goal: Ability to remain free from injury will improve Outcome: Progressing   Problem: Skin Integrity: Goal: Risk for impaired skin integrity will decrease Outcome: Progressing   Problem: Education: Goal: Ability to describe self-care measures that may prevent or decrease complications (Diabetes Survival Skills Education) will improve Outcome: Progressing Goal: Individualized Educational Video(s) Outcome: Progressing   Problem: Coping: Goal: Ability to adjust to condition or change in health will improve Outcome: Progressing   Problem: Fluid Volume: Goal: Ability to maintain a balanced intake and output will improve Outcome: Progressing   Problem: Health Behavior/Discharge Planning: Goal: Ability to identify and utilize available resources and services will improve Outcome: Progressing

## 2024-02-29 NOTE — Progress Notes (Signed)
 PROGRESS NOTE   Maria Pace  FMW:[id number] DOB: 1941/01/25 DOA: 02/16/2024 PCP: Gammon, Chrystal, NP   No chief complaint on file.  Level of care: Telemetry  Brief Admission History:  83 y.o. female with medical history significant of bipolar 1 disorder, schizophrenia, essential hypertension, GERD, hyperlipidemia, history of irregular heartbeat, type 2 diabetes mellitus, PAF on apixaban , HFrEF (EF 30-35%), HTN, patient with hospitalization last July, due to acute anemia from GI bleed, for which she refused endoscopy/colonoscopy . - Was sent from her group home as she was found blood with large amount of diarrhea, dark stools, poor historian due to schizophrenia, she is answering question appropriately but cannot give exact details, she had anemia in July, refused GI evaluation, unfortunately there is no medication list by the patient, by by reviewing dispense record it does appear she is not anymore on her metoprolol  or Eliquis . - In ED she had fever 101.4, CTA GI bleed protocol with no evidence of active extravasation, but was significant for sterile coral colitis, patient had manual disimpaction by ED physician, no stool in color, was followed by gray loose stool, no melena, but she was Hemoccult positive, her globin was stable, creatinine at baseline, [facility] hospital[facility] consulted to admit   Assessment and Plan:  Sepsis, POA due to Sterocoral colitis Hemoccult positive stool -Occult positive stool, but hemoglobin is stable, had manual disimpaction in ED on admission, impacted stool was brown in color, no melena or bright red blood per rectum, followed by diarrhea dark gray in color (is on iron  tablets), no melena, Hemoccult positive stool most likely due to colitis and rectal trauma from manual disimpaction. -Blood cultures NGTD - treated with IV Zosyn --overall much improved WBC is down to 5.3 from 11.0, transitioned to oral Augmentin  on 02/22/2024, last dose 02/25/2024 - Continue on  laxative regimen---avoid constipation -PTA aspirin  remains on hold - Appreciate GI recommendations to conservatively manage and follow CBC and continue PPI twice daily as well as laxatives -CTA GI bleeding protocol is negative -Anesthesia and GI team reluctant to do colonoscopy due to concerns about cardiovascular status with low EF and pulmonary hypertension in the setting of relatively stable Hgb -Appreciate palliative consultation for goals of care.  Continue full scope of care for now per discussion.    - Cortisol 14.5 and 2D echocardiogram with low EF, but similar to prior. - Patient has been weaned off Levophed  as early hours of of 02/20/2024  - BP stable on midodrine    Acute on chronic Anemia  Hgb 10.2 >>9.3 >>9.2 >>8.5 >>8.0>>8.6 -No obvious bleeding at this time -As per GI team anesthesia is reluctant to perform endoscopy here at [facility] due to severe pulmonary hypertension and congestive heart failure concerns with low EF- --if endoscopy is required patient may have to transfer to [facility] --Underlying chronic anemia may be partly due to CKD -Transfuse as clinically indicated -Prior anemia workup revealed that folate and B12 were not low -Ferritin was low at 10 on 10/20/2022, ferritin is now at 74 -Serum iron  is up to 64 from 26 from was ago and saturation is up to 20 from 7 about 4 months ago -Continue iron  with multivitamin supplementation   Chronic HFrEF - 09/26/2023 echo EF 30-35%, normal RVF, moderate MR Repeat Echo on 02/19/24--- EF is still 30 to 35%,  -left Ventricle: No LV thrombus by Definity . Left ventricular ejection fraction, by estimation, is 30 to 35%. The left ventricle has moderately decreased function. The left ventricle demonstrates regional wall motion  abnormalities.  There is no left ventricular hypertrophy. Abnormal (paradoxical) septal motion, consistent with left bundle branch block.   LV Wall Scoring:  The entire anterior wall and anterior septum are  akinetic. The inferior  septum, entire inferior wall, and posterior wall are hypokinetic. The apex  is normal.  -Okay to resume Entresto  and Toprol -XL at 12.5 mg twice daily- -Aldactone  on hold due to BP concerns -Continue midodrine  for pressure support   Paroxysmal atrial fibrillation -- Continue with amiodarone  and Toprol -XL for rate control -By reviewing records it does appear she has not been on Eliquis  for few months currently, and I would assume this is most likely in the setting of her previous anemia due to GI bleed and previously refusing anemia workup as it does appear risks outweighed benefits anticoagulation - Eliquis  discontinued due to ongoing GI bleed concerns   Schizophrenia, unspecified type - Behavioral issues improving -- continue olanzapine  - Continue Depakote  - Patient's sister is now her legal guardian   AKI on CKD stage IIIb with hyponatremia and metabolic acidosis -Likely in the setting of hypotension  - Creatinine is down to 1.2 from 2.73 on 02/19/2024 -- renally adjust medications, avoid nephrotoxic agents / dehydration  / hypotension    Diabetes mellitus type 2, controlled - A1c--5.2  -Hold Metformin  Use Novolog /Humalog Sliding scale insulin  with Accu-Cheks/Fingersticks as ordered    Morbid obesity-class III -Low calorie diet, portion control and increase physical activity discussed with patient -Body mass index is 43.44 kg/m.   Hypothyroidism-TSH 8.64 from 47.0 on 10/26/2023 - levothyroxine  75 mcg daily   Acute urinary retention--Foley removed a.m. of 02/21/2024 --Patient is voiding well, continue Flomax   DVT prophylaxis: SCDs Code Status: Full  Family Communication:  Disposition: SNF placement in process    Consultants:   Procedures:   Antimicrobials:    Subjective: No complaints, eating breakfast.  Agreeable to rehab.   Objective: Vitals:   02/28/24 1302 02/28/24 1941 02/29/24 0550 02/29/24 0854  BP: 121/72 (!) 104/51 (!) 135/94  113/62  Pulse: 65 65 66   Resp: 18 18 20    Temp: 98 F (36.7 C) 98.4 F (36.9 C) 97.6 F (36.4 C)   TempSrc:  Oral Oral   SpO2: 94% 95% 96%   Weight:      Height:        Intake/Output Summary (Last 24 hours) at 02/29/2024 1140 Last data filed at 02/29/2024 0600 Gross per 24 hour  Intake --  Output 500 ml  Net -500 ml   Filed Weights   02/18/24 0758 02/19/24 0445 02/21/24 0936  Weight: 99.4 kg 100.9 kg 100.9 kg   Examination:  General exam: Appears calm and comfortable  Respiratory system: Clear to auscultation. Respiratory effort normal. Cardiovascular system: normal S1 & S2 heard. No JVD, murmurs, rubs, gallops or clicks. No pedal edema. Gastrointestinal system: Abdomen is nondistended, soft and nontender. No organomegaly or masses felt. Normal bowel sounds heard. Central nervous system: Alert and oriented. No focal neurological deficits. Extremities: Symmetric 5 x 5 power. Skin: No rashes, lesions or ulcers. Psychiatry: Judgement and insight appear normal. Mood & affect appropriate.   Data Reviewed: I have personally reviewed following labs and imaging studies  CBC: Recent Labs  Lab 02/23/24 0421 02/24/24 0236 02/25/24 0442  HGB 8.5* 8.0* 8.6*  HCT 26.0* 25.4* 27.2*    Basic Metabolic Panel: Recent Labs  Lab 02/23/24 0421  NA 137  K 4.3  CL 104  CO2 25  GLUCOSE 96  BUN 14  CREATININE 1.22*  CALCIUM  8.5*  PHOS 2.5    CBG: Recent Labs  Lab 02/28/24 1134 02/28/24 1619 02/28/24 2109 02/29/24 0735 02/29/24 1118  GLUCAP 344* 164* 154* 86 127*    No results found for this or any previous visit (from the past 240 hours).   Radiology Studies: No results found.  Scheduled Meds:  amiodarone   200 mg Oral Daily   Chlorhexidine  Gluconate Cloth  6 each Topical Daily   divalproex   250 mg Oral Daily   feeding supplement  237 mL Oral BID BM   insulin  aspart  0-9 Units Subcutaneous TID AC & HS   latanoprost   1 drop Both Eyes QHS   levothyroxine   75  mcg Oral Q0600   liver oil-zinc  oxide   Topical Q0600   metoprolol  succinate  12.5 mg Oral BID   midodrine   10 mg Oral TID WC   multivitamins with iron   1 tablet Oral Daily   OLANZapine   20 mg Oral QHS   OLANZapine   5 mg Oral QHS   pantoprazole   40 mg Oral BID   polyethylene glycol  17 g Oral Daily   senna-docusate  1 tablet Oral BID   sodium bicarbonate   650 mg Oral BID   tamsulosin   0.4 mg Oral QPC supper   Continuous Infusions:   LOS: 13 days   Time spent: 35 mins  Ruben Mahler Vicci, MD How to contact the [facility] Attending or Consulting provider 7A - 7P or covering provider during after hours 7P -7A, for this patient?  Check the care team in [facility] and look for a) attending/consulting [facility] provider listed and b) the [facility] team listed Log into [email or web address] to find provider on call.  Locate the [facility] provider you are looking for under [facility] and page to a number that you can be directly reached. If you still have difficulty reaching the provider, please page the [facility] (Director on Call) for the Hospitalists listed on amion for assistance.  02/29/2024, 11:40 AM

## 2024-03-01 DIAGNOSIS — I480 Paroxysmal atrial fibrillation: Secondary | ICD-10-CM | POA: Diagnosis not present

## 2024-03-01 DIAGNOSIS — K5289 Other specified noninfective gastroenteritis and colitis: Secondary | ICD-10-CM | POA: Diagnosis not present

## 2024-03-01 DIAGNOSIS — E1121 Type 2 diabetes mellitus with diabetic nephropathy: Secondary | ICD-10-CM

## 2024-03-01 DIAGNOSIS — I5022 Chronic systolic (congestive) heart failure: Secondary | ICD-10-CM | POA: Diagnosis not present

## 2024-03-01 DIAGNOSIS — D649 Anemia, unspecified: Secondary | ICD-10-CM | POA: Diagnosis not present

## 2024-03-01 LAB — GLUCOSE, CAPILLARY: Glucose-Capillary: 97 mg/dL (ref 70–99)

## 2024-03-01 NOTE — Plan of Care (Signed)
   Problem: Nutrition: Goal: Adequate nutrition will be maintained Outcome: Progressing   Problem: Coping: Goal: Level of anxiety will decrease Outcome: Progressing   Problem: Elimination: Goal: Will not experience complications related to bowel motility Outcome: Progressing Goal: Will not experience complications related to urinary retention Outcome: Progressing

## 2024-03-01 NOTE — Progress Notes (Signed)
 IV's discontinued. Report called to [facility].

## 2024-03-01 NOTE — Progress Notes (Signed)
 Mobility Specialist Progress Note:    03/01/24 1110  Mobility  Activity Ambulated with assistance  Level of Assistance Standby assist, set-up cues, supervision of patient - no hands on  Assistive Device Front wheel walker  Distance Ambulated (ft) 6 ft  Range of Motion/Exercises Active;All extremities  Activity Response Tolerated well  Mobility Referral Yes  Mobility visit 1 Mobility  Mobility Specialist Start Time (ACUTE ONLY) 1110  Mobility Specialist Stop Time (ACUTE ONLY) 1122  Mobility Specialist Time Calculation (min) (ACUTE ONLY) 12 min   Pt received in chair, assisting with d/c. Required SBA to stand and ambulate with RW. Tolerated well, asx throughout. Left in SUV with ride, all needs met.  [place] Mobility Specialist Please contact via Special Educational Needs Teacher or  Rehab office at [phone]

## 2024-03-01 NOTE — TOC Transition Note (Signed)
 Transition of Care [facility]) - Discharge Note   Patient Details  Name: Maria Pace MRN: [id number] Date of Birth: Nov 17, 1940  [facility] CM/SW Contact:  Lucie Lunger, LCSWA Phone Number: 03/01/2024, 10:20 AM  Clinical Narrative:    CSW updated that pts insurance shara has been approved for SNF. CSW spoke to Debbie in admissions who confirms they can accept pt today. CSW sent room and report to RN. Safe transport arranged for pickup. CSW spoke with pts sister to provide update. TOC signing off.   Final next level of care: Skilled Nursing Facility Barriers to Discharge: Barriers Resolved   Patient Goals and CMS Choice Patient states their goals for this hospitalization and ongoing recovery are:: get stronger CMS Medicare.gov Compare Post Acute Care list provided to:: Patient Represenative (must comment) Choice offered to / list presented to : [facility] POA / Guardian      Discharge Placement                Patient to be transferred to facility by: Safe transport Name of family member notified: sister Patient and family notified of of transfer: 03/01/24  Discharge Plan and Services Additional resources added to the After Visit Summary for   In-house Referral: Clinical Social Work Discharge Planning Services: CM Consult Post Acute Care Choice: Home Health                               Social Drivers of Health (SDOH) Interventions SDOH Screenings   Food Insecurity: No Food Insecurity (02/16/2024)  Housing: Low Risk  (02/16/2024)  Transportation Needs: No Transportation Needs (02/16/2024)  Recent Concern: Transportation Needs - Unmet Transportation Needs (02/16/2024)  Utilities: Not At Risk (02/16/2024)  Depression (PHQ2-9): High Risk (09/06/2021)  Social Connections: Unknown (02/16/2024)  Tobacco Use: Low Risk  (02/21/2024)     Readmission Risk Interventions    03/01/2024   10:19 AM 02/18/2024   10:48 AM 02/17/2024   12:13 PM  Readmission Risk  Prevention Plan  Transportation Screening Complete Complete Complete  PCP or Specialist Appt within 3-5 Days  Complete   HRI or Home Care Consult Complete Complete Complete  Social Work Consult for Recovery Care Planning/Counseling Complete Complete Complete  Palliative Care Screening Not Applicable Not Applicable Not Applicable  Medication Review Oceanographer) Complete Complete Complete

## 2024-03-01 NOTE — Discharge Summary (Signed)
 Physician Discharge Summary  Maria Pace FMW:[id number] DOB: 01-10-1941 DOA: 02/16/2024  PCP: Gammon, Chrystal, NP  Admit date: 02/16/2024 Discharge date: 03/01/2024  Disposition:  SNF   Recommendations for Outpatient Follow-up:  Follow up with cardiology in 2-3 weeks Follow up with PCP in 2 weeks Please check blood sugars 4 times daily  Please obtain BMP/CBC in 1 week Please order outpatient palliative medicine consult  Home Health:  SNF   Discharge Condition: STABLE   CODE STATUS: FULL DIET: low sodium and carb modified (diabetic)   Brief Hospitalization Summary: Please see all hospital notes, images, labs for full details of the hospitalization. Brief Admission History:  83 y.o. female with medical history significant of bipolar 1 disorder, schizophrenia, essential hypertension, GERD, hyperlipidemia, history of irregular heartbeat, type 2 diabetes mellitus, PAF on apixaban , HFrEF (EF 30-35%), HTN, patient with hospitalization last July, due to acute anemia from GI bleed, for which she refused endoscopy/colonoscopy . - Was sent from her group home as she was found blood with large amount of diarrhea, dark stools, poor historian due to schizophrenia, she is answering question appropriately but cannot give exact details, she had anemia in July, refused GI evaluation, unfortunately there is no medication list by the patient, by by reviewing dispense record it does appear she is not anymore on her metoprolol  or Eliquis . - In ED she had fever 101.4, CTA GI bleed protocol with no evidence of active extravasation, but was significant for sterile coral colitis, patient had manual disimpaction by ED physician, no stool in color, was followed by gray loose stool, no melena, but she was Hemoccult positive, her globin was stable, creatinine at baseline, [facility] hospitalist consulted to admit   Assessment and Plan:   Sepsis, POA due to Sterocoral colitis Hemoccult positive stool -Occult positive  stool, but hemoglobin is stable, had manual disimpaction in ED on admission, impacted stool was brown in color, no melena or bright red blood per rectum, followed by diarrhea dark gray in color (is on iron  tablets), no melena, Hemoccult positive stool most likely due to colitis and rectal trauma from manual disimpaction. -Blood cultures NGTD - treated with IV Zosyn --overall much improved WBC is down to 5.3 from 11.0, transitioned to oral Augmentin  on 02/22/2024, last dose 02/25/2024 - Continue on laxative regimen---avoid constipation -PTA aspirin  remains on hold - Appreciate GI recommendations to conservatively manage and follow CBC and continue PPI twice daily as well as laxatives -CTA GI bleeding protocol is negative -Anesthesia and GI team reluctant to do colonoscopy due to concerns about cardiovascular status with low EF and pulmonary hypertension in the setting of relatively stable Hgb -Appreciate palliative consultation for goals of care.  Continue full scope of care for now per discussion.    - Cortisol 14.5 and 2D echocardiogram with low EF, but similar to prior. - Patient has been weaned off Levophed  as early hours of of 02/20/2024  - BP stable on midodrine    Acute on chronic Anemia  Hgb 10.2 >>9.3 >>9.2 >>8.5 >>8.0>>8.6 -No obvious bleeding at this time -As per GI team anesthesia is reluctant to perform endoscopy here at [facility] due to severe pulmonary hypertension and congestive heart failure concerns with low EF- --if endoscopy is required patient may have to transfer to [facility] --Underlying chronic anemia may be partly due to CKD -Transfuse as clinically indicated -Prior anemia workup revealed that folate and B12 were not low -Ferritin was low at 10 on 10/20/2022, ferritin is now at 74 -Serum iron  is  up to 64 from 26 from was ago and saturation is up to 20 from 7 about 4 months ago -Continue iron  with multivitamin supplementation   Chronic HFrEF - 09/26/2023 echo EF 30-35%,  normal RVF, moderate MR Repeat Echo on 02/19/24--- EF is still 30 to 35%  -left Ventricle: No LV thrombus by Definity . Left ventricular ejection fraction, by estimation, is 30 to 35%. The left ventricle has moderately decreased function. The left ventricle demonstrates regional wall motion abnormalities.  There is no left ventricular hypertrophy. Abnormal (paradoxical) septal motion, consistent with left bundle branch block.   LV Wall Scoring:  The entire anterior wall and anterior septum are akinetic. The inferior  septum, entire inferior wall, and posterior wall are hypokinetic. The apex  is normal.  -resumed Entresto  and Toprol -XL at 12.5 mg twice daily -Aldactone  on hold due to BP concerns -Continue midodrine  for pressure support -ambulatory referral to [facility] for cardiology follow up    Paroxysmal atrial fibrillation -- Continue with amiodarone  and Toprol -XL for rate control -By reviewing records it does appear she has not been on Eliquis  for few months currently, and I would assume this is most likely in the setting of her previous anemia due to GI bleed and previously refusing anemia workup as it does appear risks outweighed benefits anticoagulation - Eliquis  discontinued due to ongoing GI bleed concerns   Schizophrenia, unspecified type - Behavioral issues improving - continue olanzapine  - Continue Depakote  - Patient's sister is now her legal guardian   AKI on CKD stage IIIb with hyponatremia and metabolic acidosis -Likely in the setting of hypotension  - Creatinine is down to 1.2 from 2.73 on 02/19/2024 -- renally adjust medications, avoid nephrotoxic agents / dehydration  / hypotension    Diabetes mellitus type 2, controlled - A1c--5.2  -Hold Metformin  Use Novolog /Humalog Sliding scale insulin  with Accu-Cheks/Fingersticks as ordered   CBG (last 3)  Recent Labs    02/29/24 1620 02/29/24 1925 03/01/24 0717  GLUCAP 137* 211* 97   Morbid obesity-class III -Low  calorie diet, portion control and increase physical activity discussed with patient -Body mass index is 43.44 kg/m.   Hypothyroidism-TSH 8.64 from 47.0 on 10/26/2023 - levothyroxine  75 mcg daily   Acute urinary retention--Foley removed a.m. of 02/21/2024 --Patient is voiding well, continue Flomax    Discharge Diagnoses:  Principal Problem:   Stercoral colitis Active Problems:   Hypertension   Type 2 diabetes mellitus (HCC)   Severe dehydration   Schizophrenia (HCC)   GERD (gastroesophageal reflux disease)   PAF (paroxysmal atrial fibrillation) (HCC)   Chronic HFrEF (heart failure with reduced ejection fraction) (HCC)   Chronic constipation   Acute on chronic anemia   Gastrointestinal hemorrhage   Discharge Instructions: Discharge Instructions     Ambulatory referral to Cardiology   Complete by: As directed    Call MD for:  difficulty breathing, headache or visual disturbances   Complete by: As directed    Call MD for:  persistant dizziness or light-headedness   Complete by: As directed    Call MD for:  persistant nausea and vomiting   Complete by: As directed    Call MD for:  temperature >100.4   Complete by: As directed    Diet - low sodium heart healthy   Complete by: As directed    Discharge instructions   Complete by: As directed    1)Avoid ibuprofen/Advil/Aleve/Motrin/Goody Powders/Naproxen/BC powders/Meloxicam/Diclofenac/Indomethacin and other Nonsteroidal anti-inflammatory medications as these will make you more likely to bleed and  can cause stomach ulcers, can also cause Kidney problems.   2)Watch for bleeding while on Blood Thinners--watch for blood in your stool which can make your stool black, maroon, mahogany or red---, blood in your urine which can make your urine pink or red, nosebleeds , also watch for possible bruising -You are taking Aspirin -- which is a blood thinner--- be careful to avoid injury or falls  3)Repeat CBC and BMP Blood Tests every Friday x  3 weeks ---starting 03/01/24   Discharge wound care:   Complete by: As directed    As advised   Increase activity slowly   Complete by: As directed       Allergies as of 03/01/2024       Reactions   Haloperidol Lactate Other (See Comments)   Couldn't urinate anymore        Medication List     STOP taking these medications    docusate sodium  100 MG capsule Commonly known as: COLACE   Eliquis  5 MG Tabs tablet Generic drug: apixaban    metoprolol  tartrate 25 MG tablet Commonly known as: LOPRESSOR    spironolactone  25 MG tablet Commonly known as: ALDACTONE        TAKE these medications    acetaminophen  325 MG tablet Commonly known as: TYLENOL  Take 2 tablets (650 mg total) by mouth every 6 (six) hours as needed for mild pain (pain score 1-3) or fever (or Fever >/= 101).   albuterol  (2.5 MG/3ML) 0.083% nebulizer solution Commonly known as: PROVENTIL  Take 3 mLs (2.5 mg total) by nebulization every 2 (two) hours as needed for wheezing.   amiodarone  200 MG tablet Commonly known as: PACERONE  Take 1 tablet (200 mg total) by mouth daily. What changed: See the new instructions.   ammonium lactate 12 % lotion Commonly known as: LAC-HYDRIN Apply 1 Application topically as needed for dry skin.   ascorbic acid  500 MG tablet Commonly known as: VITAMIN C  Take 1 tablet (500 mg total) by mouth daily.   aspirin  EC 81 MG tablet Take 1 tablet (81 mg total) by mouth daily with breakfast. Swallow whole. What changed: when to take this   atorvastatin  20 MG tablet Commonly known as: LIPITOR Take 20 mg by mouth daily.   cholecalciferol  10 MCG (400 UNIT) Tabs tablet Commonly known as: VITAMIN D3 Take 1,000 Units by mouth daily.   [employer] MEDICATED CHEST RUB EX Apply topically.   cyanocobalamin  500 MCG tablet Commonly known as: VITAMIN B12 Take 1 tablet (500 mcg total) by mouth daily.   divalproex  250 MG 24 hr tablet Commonly known as: DEPAKOTE  ER Take 1 tablet (250 mg  total) by mouth daily.   FeroSul 325 (65 Fe) MG tablet Generic drug: ferrous sulfate  Take 1 tablet (325 mg total) by mouth daily with breakfast. What changed: when to take this   gabapentin  300 MG capsule Commonly known as: Neurontin  Take 1 capsule (300 mg total) by mouth at bedtime. What changed:  medication strength how much to take   latanoprost  0.005 % ophthalmic solution Commonly known as: XALATAN  Place 1 drop into both eyes at bedtime.   levothyroxine  75 MCG tablet Commonly known as: SYNTHROID  Take 1 tablet (75 mcg total) by mouth daily before breakfast. What changed:  medication strength how much to take when to take this   loratadine  10 MG tablet Commonly known as: CLARITIN  Take 10 mg by mouth daily.   metFORMIN 500 MG tablet Commonly known as: GLUCOPHAGE Take 500 mg by mouth 2 (two) times daily with  a meal.   metoprolol  succinate 25 MG 24 hr tablet Commonly known as: TOPROL -XL Take 0.5 tablets (12.5 mg total) by mouth 2 (two) times daily.   midodrine  10 MG tablet Commonly known as: PROAMATINE  Take 1 tablet (10 mg total) by mouth 3 (three) times daily with meals.   multivitamin tablet Take 1 tablet by mouth daily.   OLANZapine  5 MG tablet Commonly known as: ZYPREXA  Take 1 tablet (5 mg total) by mouth at bedtime. Total of 25 mg at night. Take along with 20 mg tab What changed: Another medication with the same name was changed. Make sure you understand how and when to take each.   OLANZapine  20 MG tablet Commonly known as: ZYPREXA  Take 1 tablet (20 mg total) by mouth at bedtime. Total of 25 mg at night. Take along with 20 mg tab What changed: additional instructions   pantoprazole  40 MG tablet Commonly known as: PROTONIX  Take 1 tablet (40 mg total) by mouth 2 (two) times daily. What changed: when to take this   polyethylene glycol powder 17 GM/SCOOP powder Commonly known as: GLYCOLAX /MIRALAX  Take 17 g by mouth every Tuesday, Thursday, Saturday, and  Sunday at 6 PM. What changed:  when to take this reasons to take this   sacubitril -valsartan  24-26 MG Commonly known as: ENTRESTO  Take 1 tablet by mouth 2 (two) times daily.   senna-docusate 8.6-50 MG tablet Commonly known as: Senokot-S Take 2 tablets by mouth at bedtime.   sodium bicarbonate  650 MG tablet Take 1 tablet (650 mg total) by mouth 2 (two) times daily.   tamsulosin  0.4 MG Caps capsule Commonly known as: FLOMAX  Take 1 capsule (0.4 mg total) by mouth daily after supper.   zinc  sulfate (50mg  elemental zinc ) 220 (50 Zn) MG capsule Take 220 mg by mouth daily.               Discharge Care Instructions  (From admission, onward)           Start     Ordered   02/27/24 0000  Discharge wound care:       Comments: As advised   02/27/24 1419            Contact information for follow-up providers     Wilmon Penton, NP. Schedule an appointment as soon as possible for a visit in 1 week(s).   Specialty: Nurse Practitioner Why: Repeat CBC and BMP Blood Tests Contact information: [address] [place] [address] [phone]         [facility]. Schedule an appointment as soon as possible for a visit in 2 week(s).   Specialty: Cardiology Why: Hospital Follow Up Contact information: [address] [place]  [address] [phone]             Contact information for after-discharge care     Destination     [facility] .   Service: Skilled Nursing Contact information: [address] [place]  [address] [phone]                    Allergies  Allergen Reactions   Haloperidol Lactate Other (See Comments)    Couldn't urinate anymore   Allergies as of 03/01/2024       Reactions   Haloperidol Lactate Other (See Comments)   Couldn't urinate anymore        Medication List     STOP taking these medications  docusate  sodium 100 MG capsule Commonly known as: COLACE   Eliquis  5 MG Tabs tablet Generic drug: apixaban    metoprolol  tartrate 25 MG tablet Commonly known as: LOPRESSOR    spironolactone  25 MG tablet Commonly known as: ALDACTONE        TAKE these medications    acetaminophen  325 MG tablet Commonly known as: TYLENOL  Take 2 tablets (650 mg total) by mouth every 6 (six) hours as needed for mild pain (pain score 1-3) or fever (or Fever >/= 101).   albuterol  (2.5 MG/3ML) 0.083% nebulizer solution Commonly known as: PROVENTIL  Take 3 mLs (2.5 mg total) by nebulization every 2 (two) hours as needed for wheezing.   amiodarone  200 MG tablet Commonly known as: PACERONE  Take 1 tablet (200 mg total) by mouth daily. What changed: See the new instructions.   ammonium lactate 12 % lotion Commonly known as: LAC-HYDRIN Apply 1 Application topically as needed for dry skin.   ascorbic acid  500 MG tablet Commonly known as: VITAMIN C  Take 1 tablet (500 mg total) by mouth daily.   aspirin  EC 81 MG tablet Take 1 tablet (81 mg total) by mouth daily with breakfast. Swallow whole. What changed: when to take this   atorvastatin  20 MG tablet Commonly known as: LIPITOR Take 20 mg by mouth daily.   cholecalciferol  10 MCG (400 UNIT) Tabs tablet Commonly known as: VITAMIN D3 Take 1,000 Units by mouth daily.   [employer] MEDICATED CHEST RUB EX Apply topically.   cyanocobalamin  500 MCG tablet Commonly known as: VITAMIN B12 Take 1 tablet (500 mcg total) by mouth daily.   divalproex  250 MG 24 hr tablet Commonly known as: DEPAKOTE  ER Take 1 tablet (250 mg total) by mouth daily.   FeroSul 325 (65 Fe) MG tablet Generic drug: ferrous sulfate  Take 1 tablet (325 mg total) by mouth daily with breakfast. What changed: when to take this   gabapentin  300 MG capsule Commonly known as: Neurontin  Take 1 capsule (300 mg total) by mouth at bedtime. What changed:  medication strength how much to take    latanoprost  0.005 % ophthalmic solution Commonly known as: XALATAN  Place 1 drop into both eyes at bedtime.   levothyroxine  75 MCG tablet Commonly known as: SYNTHROID  Take 1 tablet (75 mcg total) by mouth daily before breakfast. What changed:  medication strength how much to take when to take this   loratadine  10 MG tablet Commonly known as: CLARITIN  Take 10 mg by mouth daily.   metFORMIN 500 MG tablet Commonly known as: GLUCOPHAGE Take 500 mg by mouth 2 (two) times daily with a meal.   metoprolol  succinate 25 MG 24 hr tablet Commonly known as: TOPROL -XL Take 0.5 tablets (12.5 mg total) by mouth 2 (two) times daily.   midodrine  10 MG tablet Commonly known as: PROAMATINE  Take 1 tablet (10 mg total) by mouth 3 (three) times daily with meals.   multivitamin tablet Take 1 tablet by mouth daily.   OLANZapine  5 MG tablet Commonly known as: ZYPREXA  Take 1 tablet (5 mg total) by mouth at bedtime. Total of 25 mg at night. Take along with 20 mg tab What changed: Another medication with the same name was changed. Make sure you understand how and when to take each.   OLANZapine  20 MG tablet Commonly known as: ZYPREXA  Take 1 tablet (20 mg total) by mouth at bedtime. Total of 25 mg at night. Take along with 20 mg tab What changed: additional instructions   pantoprazole  40 MG tablet Commonly known as:  PROTONIX  Take 1 tablet (40 mg total) by mouth 2 (two) times daily. What changed: when to take this   polyethylene glycol powder 17 GM/SCOOP powder Commonly known as: GLYCOLAX /MIRALAX  Take 17 g by mouth every Tuesday, Thursday, Saturday, and Sunday at 6 PM. What changed:  when to take this reasons to take this   sacubitril-valsartan 24-26 MG Commonly known as: ENTRESTO Take 1 tablet by mouth 2 (two) times daily.   senna-docusate 8.6-50 MG tablet Commonly known as: Senokot-S Take 2 tablets by mouth at bedtime.   sodium bicarbonate 650 MG tablet Take 1 tablet (650 mg total)  by mouth 2 (two) times daily.   tamsulosin 0.4 MG Caps capsule Commonly known as: FLOMAX Take 1 capsule (0.4 mg total) by mouth daily after supper.   zinc sulfate (50mg elemental zinc) 220 (50 Zn) MG capsule Take 220 mg by mouth daily.               Discharge Care Instructions  (From admission, onward)           Start     Ordered   02/27/24 0000  Discharge wound care:       Comments: As advised   02/27/24 1419            Procedures/Studies: ECHOCARDIOGRAM COMPLETE Result Date: 02/19/2024    ECHOCARDIOGRAM REPORT   Patient Name:   Kerryann A Zobel Date of Exam: 02/19/2024 Medical Rec #:  [id number]      Height:       60.0 in Accession #:    [id number]     Weight:       222.4 lb Date of Birth:  10/31/1940       BSA:          1.953 m Patient Age:    83 years       BP:           111/58 mmHg Patient Gender: F              HR:           99  bpm. Exam Location:  Inpatient Procedure: 2D Echo, Color Doppler, Cardiac Doppler and Intracardiac            Opacification Agent (Both Spectral and Color Flow Doppler were            utilized during procedure). Indications:     Other abnormalities of the heart R00.8  History:         Patient has prior history of Echocardiogram examinations, most                  recent 09/26/2023. Risk Factors:Hypertension, Dyslipidemia and                  Diabetes.  Sonographer:     Tinnie Gosling RDCS Referring Phys:  [id number] PRATIK D [facility] Diagnosing Phys: Diannah Late Mallipeddi IMPRESSIONS  1. No LV thrombus by Definity . Left ventricular ejection fraction, by estimation, is 30 to 35%. The left ventricle has moderately decreased function. The left ventricle demonstrates regional wall motion abnormalities (see scoring diagram/findings for description). Left ventricular diastolic parameters are indeterminate.  2. Right ventricular systolic function was not well visualized. The right ventricular size is mildly enlarged.  3. The mitral valve is grossly normal. Trivial  mitral valve regurgitation. No evidence of mitral stenosis.  4. The aortic valve was not well visualized. Aortic valve regurgitation is not visualized. Comparison(s): No significant change from prior study. FINDINGS  Left Ventricle: No LV thrombus by Definity . Left ventricular ejection fraction, by estimation, is 30 to 35%. The left ventricle has moderately decreased function. The left ventricle demonstrates regional wall motion abnormalities. Strain was performed and the global longitudinal strain is indeterminate. The left ventricular internal cavity size was normal in size. There is no left ventricular hypertrophy. Abnormal (paradoxical) septal motion, consistent with left bundle branch block. Left ventricular diastolic parameters are indeterminate.  LV Wall Scoring: The entire anterior wall and anterior septum are akinetic. The inferior septum, entire inferior wall, and posterior wall are hypokinetic. The apex is normal. Right Ventricle: The right ventricular size is mildly enlarged. No increase in right ventricular wall thickness. Right ventricular systolic function was not well visualized. Left Atrium: Left atrial size was normal in size. Right Atrium: Right atrial size was normal in size. Pericardium: There is no evidence of pericardial effusion. Mitral Valve: The mitral valve is grossly normal. Trivial mitral valve regurgitation. No evidence of mitral valve stenosis. Tricuspid Valve: The tricuspid valve is not well visualized. Tricuspid valve regurgitation is mild . No evidence of tricuspid stenosis. Aortic Valve: The aortic valve was not well visualized. Aortic valve regurgitation is not visualized. Pulmonic Valve: The pulmonic valve was not well visualized. Pulmonic valve regurgitation is not visualized. No evidence of pulmonic stenosis. Aorta: The aortic root and ascending aorta are structurally normal, with no evidence of dilitation. Venous: The inferior vena cava was not well visualized. IAS/Shunts: The  interatrial septum was not well visualized. Additional Comments: 3D was performed not requiring image post processing on an independent workstation and was indeterminate.  LEFT VENTRICLE PLAX 2D LVIDd:         5.40 cm   Diastology LVIDs:         4.50 cm   LV e' medial:    4.46 cm/s LV PW:         1.10 cm   LV E/e' medial:  20.8 LV IVS:        1.10 cm   LV e' lateral:   8.81 cm/s LVOT diam:     1.90 cm   LV E/e' lateral: 10.5 LV SV:         40 LV SV Index:   20 LVOT Area:     2.84 cm LV IVRT:       98 msec  RIGHT VENTRICLE RV S prime:     12.90 cm/s  PULMONARY VEINS TAPSE (M-mode): 1.2 cm      Diastolic Velocity: 31.00 cm/s                             S/D Velocity:       0.90                             Systolic Velocity:  28.90 cm/s LEFT ATRIUM           Index LA diam:      4.80 cm 2.46 cm/m LA Vol (A4C): 38.7 ml 19.82 ml/m  AORTIC VALVE LVOT Vmax:   98.70 cm/s LVOT Vmean:  65.100 cm/s LVOT VTI:    0.141 m  AORTA Ao Root diam: 2.80 cm Ao Asc diam:  3.20 cm MITRAL VALVE               TRICUSPID VALVE MV Area (PHT): 5.79 cm    TR Peak grad:   17.6 mmHg MV  Decel Time: 131 msec    TR Vmax:        210.00 cm/s MV E velocity: 92.90 cm/s MV A velocity: 33.80 cm/s  SHUNTS MV E/A ratio:  2.75        Systemic VTI:  0.14 m                            Systemic Diam: 1.90 cm Vishnu Priya Mallipeddi Electronically signed by Diannah Late Mallipeddi Signature Date/Time: 02/19/2024/3:27:47 PM    Final (Updated)    CT ANGIO GI BLEED Result Date: 02/16/2024 EXAM: CTA ABDOMEN AND PELVIS WITH CONTRAST 02/16/2024 07:33:33 PM TECHNIQUE: CTA images of the abdomen and pelvis with intravenous contrast. Three-dimensional MIP/volume rendered formations were performed. Automated exposure control, iterative reconstruction, and/or weight based adjustment of the mA/kV was utilized to reduce the radiation dose to as low as reasonably achievable. 75 mL of iohexol  (OMNIPAQUE ) 350 MG/ML injection was administered. COMPARISON: 02/20/2017 CLINICAL  HISTORY: fever. melena FINDINGS: VASCULATURE: GI BLEED: No active contrast extravasation to localize GI bleed. AORTA: Aortic atherosclerosis. No acute finding. No abdominal aortic aneurysm. No dissection. CELIAC TRUNK: No acute finding. No occlusion or significant stenosis. SUPERIOR MESENTERIC ARTERY: No acute finding. No occlusion or significant stenosis. INFERIOR MESENTERIC ARTERY: No acute finding. No occlusion or significant stenosis. RENAL ARTERIES: No acute finding. No occlusion or significant stenosis. ILIAC ARTERIES: No acute finding. No occlusion or significant stenosis. ABDOMEN/PELVIS: LOWER CHEST: Ground glass opacities in the right lower lobe could reflect atelectasis or infiltrates. Mild cardiomegaly. LIVER: The liver is unremarkable. GALLBLADDER AND BILE DUCTS: Layering high-density material within the gallbladder, presumably small stones or sludge. No biliary ductal dilatation. SPLEEN: The spleen is unremarkable. PANCREAS: The pancreas is unremarkable. ADRENAL GLANDS: Bilateral adrenal glands demonstrate no acute abnormality. KIDNEYS, URETERS AND BLADDER: Foley catheter in the bladder, which is decompressed. No stones in the kidneys or ureters. No hydronephrosis. No perinephric or periureteral stranding. GI AND BOWEL: Large stool burden throughout the colon. Large stool burden in the rectosigmoid colon with wall thickening suggesting stercoral colitis. Stomach and duodenal sweep demonstrate no acute abnormality. There is no bowel obstruction. No abnormal bowel wall distension. REPRODUCTIVE: Reproductive organs are unremarkable. PERITONEUM AND RETROPERITONEUM: No ascites or free air. LYMPH NODES: No lymphadenopathy. BONES AND SOFT TISSUES: No acute abnormality of the bones. No acute soft tissue abnormality. IMPRESSION: 1. No active GI bleeding. 2. Large stool burden in the rectosigmoid colon with wall thickening, suggestive of stercoral colitis. 3. Right lower lobe ground-glass opacities, which may  represent atelectasis or infiltrate. 4. Mild cardiomegaly. 5. Cholelithiasis Electronically signed by: Franky Crease MD 02/16/2024 07:39 PM EST RP Workstation: [id number]   DG Chest Portable 1 View Result Date: 02/16/2024 CLINICAL DATA:  Altered mental status.  Gastrointestinal bleeding. EXAM: PORTABLE CHEST 1 VIEW COMPARISON:  10/20/2023. FINDINGS: Very poor depth of inspiration. Stable enlarged cardiac silhouette. Clear lungs with normal vascularity. Diffuse osteopenia. Moderate right glenohumeral degenerative changes. IMPRESSION: 1. No acute abnormality. 2. Stable cardiomegaly. Electronically Signed   By: Elspeth Bathe M.D.   On: 02/16/2024 15:07     Subjective: Pt eating and drinking well, no specific complaints.    Discharge Exam: Vitals:   02/29/24 1900 03/01/24 0755  BP: 108/66 107/63  Pulse:  62  Resp: 20 17  Temp: 98.4 F (36.9 C) 98.8 F (37.1 C)  SpO2: 95% 94%   Vitals:   02/29/24 0854 02/29/24 1258 02/29/24 1900 03/01/24 0755  BP: 113/62 ROLLEN)  103/59 108/66 107/63  Pulse:  65  62  Resp:  20 20 17   Temp:  97.8 F (36.6 C) 98.4 F (36.9 C) 98.8 F (37.1 C)  TempSrc:  Oral Oral Oral  SpO2:  99% 95% 94%  Weight:      Height:       General: Pt is alert, awake, not in acute distress Cardiovascular: normal S1/S2 +, no rubs, no gallops Respiratory: CTA bilaterally, no wheezing, no rhonchi Abdominal: Soft, NT, ND, bowel sounds + Extremities: no edema, no cyanosis   The results of significant diagnostics from this hospitalization (including imaging, microbiology, ancillary and laboratory) are listed below for reference.    Microbiology: No results found for this or any previous visit (from the past 240 hours).   Labs: BNP (last 3 results) Recent Labs    10/20/23 1605  BNP 228.0*   Basic Metabolic Panel: No results for input(s): NA, K, CL, CO2, GLUCOSE, BUN, CREATININE, CALCIUM , MG, PHOS in the last 168 hours. Liver Function Tests: No results  for input(s): AST, ALT, ALKPHOS, BILITOT, PROT, ALBUMIN in the last 168 hours. No results for input(s): LIPASE, AMYLASE in the last 168 hours. No results for input(s): AMMONIA in the last 168 hours. CBC: Recent Labs  Lab 02/24/24 0236 02/25/24 0442  HGB 8.0* 8.6*  HCT 25.4* 27.2*   Cardiac Enzymes: No results for input(s): CKTOTAL, CKMB, CKMBINDEX, TROPONINI in the last 168 hours. BNP: Invalid input(s): POCBNP CBG: Recent Labs  Lab 02/29/24 0735 02/29/24 1118 02/29/24 1620 02/29/24 1925 03/01/24 0717  GLUCAP 86 127* 137* 211* 97   D-Dimer No results for input(s): DDIMER in the last 72 hours. Hgb A1c No results for input(s): HGBA1C in the last 72 hours. Lipid Profile No results for input(s): CHOL, HDL, LDLCALC, TRIG, CHOLHDL, LDLDIRECT in the last 72 hours. Thyroid  function studies No results for input(s): TSH, T4TOTAL, T3FREE, THYROIDAB in the last 72 hours.  Invalid input(s): FREET3 Anemia work up No results for input(s): VITAMINB12, FOLATE, FERRITIN, TIBC, IRON , RETICCTPCT in the last 72 hours. Urinalysis    Component Value Date/Time   COLORURINE YELLOW 02/16/2024 1437   APPEARANCEUR CLEAR 02/16/2024 1437   APPEARANCEUR Clear 01/06/2022 1100   LABSPEC 1.015 02/16/2024 1437   LABSPEC 1.005 10/28/2013 0457   PHURINE 5.0 02/16/2024 1437   GLUCOSEU NEGATIVE 02/16/2024 1437   GLUCOSEU 50 mg/dL 91/96/7984 9542   HGBUR NEGATIVE 02/16/2024 1437   BILIRUBINUR NEGATIVE 02/16/2024 1437   BILIRUBINUR Negative 01/06/2022 1100   BILIRUBINUR Negative 10/28/2013 0457   KETONESUR NEGATIVE 02/16/2024 1437   PROTEINUR NEGATIVE 02/16/2024 1437   NITRITE NEGATIVE 02/16/2024 1437   LEUKOCYTESUR NEGATIVE 02/16/2024 1437   LEUKOCYTESUR 3+ 10/28/2013 0457   Sepsis Labs No results for input(s): WBC in the last 168 hours.  Invalid input(s): PROCALCITONIN, LACTICIDVEN Microbiology No results found for this or  any previous visit (from the past 240 hours).  Time coordinating discharge:  37 mins   SIGNED:  Afton Louder, MD  [facility] 03/01/2024, 9:45 AM How to contact the [facility] Attending or Consulting provider 7A - 7P or covering provider during after hours 7P -7A, for this patient?  Check the care team in [facility] and look for a) attending/consulting [facility] provider listed and b) the [facility] team listed Log into [email or web address] and use 's universal password to access. If you do not have the password, please contact the hospital operator. Locate the [facility] provider you are looking for under [facility] and page to a number  that you can be directly reached. If you still have difficulty reaching the provider, please page the [facility] (Director on Call) for the Hospitalists listed on amion for assistance.

## 2024-04-09 NOTE — Progress Notes (Unsigned)
" ° °  Cardiology Office Note    Date:  04/09/2024  ID:  Maria Pace, DOB [birth year], MRN [id number] Cardiologist: Diannah SHAUNNA Maywood, MD { :  History of Present Illness:    Maria Pace is a 84 y.o. female with past medical history of chronic HFrEF, paroxysmal atrial fibrillation, HTN, HLD, Type II DM, hypothyroidism, bipolar disorder and schizophrenia who presents to the office today for hospital follow-up.  She was last examined by Dr. Mallipeddi in 08/2023 and denied any recent chest pain or dyspnea on exertion at that time. She was not active at baseline as she lived in a nursing facility. It had previously been recommended not to pursue ischemic evaluation and she did not wish to undergo further procedures. She was continued on her current medical therapy with Amiodarone  200 mg daily, Eliquis  5 mg twice daily, Lopressor  25 mg twice daily, Entresto  24-26 mg twice daily and Spironolactone  12.5 mg daily.  ROS: ***  Studies Reviewed:   EKG: EKG is*** ordered today and demonstrates ***   EKG Interpretation Date/Time:    Ventricular Rate:    PR Interval:    QRS Duration:    QT Interval:    QTC Calculation:   R Axis:      Text Interpretation:         Echocardiogram: 01/2024 IMPRESSIONS     1. No LV thrombus by Definity . Left ventricular ejection fraction, by  estimation, is 30 to 35%. The left ventricle has moderately decreased  function. The left ventricle demonstrates regional wall motion  abnormalities (see scoring diagram/findings for  description). Left ventricular diastolic parameters are indeterminate.   2. Right ventricular systolic function was not well visualized. The right  ventricular size is mildly enlarged.   3. The mitral valve is grossly normal. Trivial mitral valve  regurgitation. No evidence of mitral stenosis.   4. The aortic valve was not well visualized. Aortic valve regurgitation  is not visualized.   Comparison(s): No significant change from prior  study.   Risk Assessment/Calculations:   {Does this patient have ATRIAL FIBRILLATION?:[phone]} No BP recorded.  {Refresh Note OR Click here to enter BP  :1}***         Physical Exam:   VS:  There were no vitals taken for this visit.   Wt Readings from Last 3 Encounters:  02/21/24 222 lb 7.1 oz (100.9 kg)  10/20/23 194 lb 0.1 oz (88 kg)  06/02/23 194 lb (88 kg)     GEN: Well nourished, well developed in no acute distress NECK: No JVD; No carotid bruits CARDIAC: ***RRR, no murmurs, rubs, gallops RESPIRATORY:  Clear to auscultation without rales, wheezing or rhonchi  ABDOMEN: Appears non-distended. No obvious abdominal masses. EXTREMITIES: No clubbing or cyanosis. No edema.  Distal pedal pulses are 2+ bilaterally.   Assessment and Plan:      {Are you ordering a CV Procedure (e.g. stress test, cath, DCCV, TEE, etc)?   Press F2        :[id number]}   Signed, Laymon CHRISTELLA Qua, PA-C   "

## 2024-04-10 ENCOUNTER — Ambulatory Visit: Attending: Student | Admitting: Student
